# Patient Record
Sex: Male | Born: 1980
Health system: Southern US, Community
[De-identification: ages and names within clinical notes are randomized; demographics above are authoritative.]

## PROBLEM LIST (undated history)

## (undated) DIAGNOSIS — I10 Essential (primary) hypertension: Secondary | ICD-10-CM

## (undated) DIAGNOSIS — N189 Chronic kidney disease, unspecified: Secondary | ICD-10-CM

## (undated) DIAGNOSIS — I519 Heart disease, unspecified: Secondary | ICD-10-CM

## (undated) DIAGNOSIS — E669 Obesity, unspecified: Secondary | ICD-10-CM

## (undated) DIAGNOSIS — E119 Type 2 diabetes mellitus without complications: Secondary | ICD-10-CM

## (undated) DIAGNOSIS — N186 End stage renal disease: Secondary | ICD-10-CM

## (undated) HISTORY — PX: TONSILLECTOMY AND ADENOIDECTOMY: SUR1326

---
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
## 2008-08-19 ENCOUNTER — Emergency Department (HOSPITAL_COMMUNITY): Admission: EM | Admit: 2008-08-19 | Discharge: 2008-08-19 | Payer: Self-pay | Admitting: Emergency Medicine

## 2011-12-24 ENCOUNTER — Encounter (HOSPITAL_COMMUNITY): Payer: Self-pay | Admitting: *Deleted

## 2011-12-24 ENCOUNTER — Inpatient Hospital Stay (HOSPITAL_COMMUNITY)
Admission: EM | Admit: 2011-12-24 | Discharge: 2011-12-27 | DRG: 683 | Disposition: A | Discharge status: Home / Self Care | Payer: Medicaid Other | Attending: Internal Medicine | Admitting: Internal Medicine

## 2011-12-24 DIAGNOSIS — R739 Hyperglycemia, unspecified: Secondary | ICD-10-CM

## 2011-12-24 DIAGNOSIS — E119 Type 2 diabetes mellitus without complications: Secondary | ICD-10-CM | POA: Diagnosis present

## 2011-12-24 DIAGNOSIS — N186 End stage renal disease: Secondary | ICD-10-CM | POA: Diagnosis present

## 2011-12-24 DIAGNOSIS — Z6837 Body mass index (BMI) 37.0-37.9, adult: Secondary | ICD-10-CM

## 2011-12-24 DIAGNOSIS — N289 Disorder of kidney and ureter, unspecified: Secondary | ICD-10-CM

## 2011-12-24 DIAGNOSIS — I160 Hypertensive urgency: Secondary | ICD-10-CM | POA: Diagnosis present

## 2011-12-24 DIAGNOSIS — N179 Acute kidney failure, unspecified: Principal | ICD-10-CM | POA: Diagnosis present

## 2011-12-24 DIAGNOSIS — I10 Essential (primary) hypertension: Secondary | ICD-10-CM | POA: Diagnosis present

## 2011-12-24 DIAGNOSIS — H538 Other visual disturbances: Secondary | ICD-10-CM | POA: Diagnosis present

## 2011-12-24 DIAGNOSIS — E871 Hypo-osmolality and hyponatremia: Secondary | ICD-10-CM | POA: Diagnosis present

## 2011-12-24 HISTORY — DX: Essential (primary) hypertension: I10

## 2011-12-24 LAB — CARDIAC PANEL(CRET KIN+CKTOT+MB+TROPI): Total CK: 1254 U/L — ABNORMAL HIGH (ref 7–232)

## 2011-12-24 LAB — COMPREHENSIVE METABOLIC PANEL
Albumin: 3 g/dL — ABNORMAL LOW (ref 3.5–5.2)
Alkaline Phosphatase: 98 U/L (ref 39–117)
BUN: 17 mg/dL (ref 6–23)
Chloride: 98 mEq/L (ref 96–112)
GFR calc Af Amer: 47 mL/min — ABNORMAL LOW (ref >90)
Glucose, Bld: 402 mg/dL — ABNORMAL HIGH (ref 70–99)
Potassium: 4.9 mEq/L (ref 3.5–5.1)
Total Bilirubin: 0.3 mg/dL (ref 0.3–1.2)

## 2011-12-24 LAB — CBC
HCT: 39.8 % (ref 39.0–52.0)
Hemoglobin: 13.5 g/dL (ref 13.0–17.0)
MCH: 27.6 pg (ref 26.0–34.0)
MCHC: 33.9 g/dL (ref 30.0–36.0)

## 2011-12-24 LAB — URINALYSIS, ROUTINE W REFLEX MICROSCOPIC
Glucose, UA: >1000 mg/dL — AB
Leukocytes, UA: NEGATIVE
Nitrite: NEGATIVE
Protein, ur: 100 mg/dL — AB

## 2011-12-24 LAB — RAPID URINE DRUG SCREEN, HOSP PERFORMED
Opiates: NOT DETECTED
Tetrahydrocannabinol: NOT DETECTED

## 2011-12-24 LAB — GLUCOSE, CAPILLARY: Glucose-Capillary: 224 mg/dL — ABNORMAL HIGH (ref 70–99)

## 2011-12-24 LAB — MRSA PCR SCREENING: MRSA by PCR: NEGATIVE

## 2011-12-24 LAB — URINE MICROSCOPIC-ADD ON

## 2011-12-24 LAB — DIFFERENTIAL
Basophils Relative: 0 % (ref 0–1)
Eosinophils Absolute: 0.1 10*3/uL (ref 0.0–0.7)
Monocytes Absolute: 0.7 10*3/uL (ref 0.1–1.0)
Monocytes Relative: 8 % (ref 3–12)

## 2011-12-24 LAB — HEMOGLOBIN A1C: Hgb A1c MFr Bld: 15.8 % — ABNORMAL HIGH (ref <5.7)

## 2011-12-24 MED ORDER — INSULIN ASPART 100 UNIT/ML ~~LOC~~ SOLN
0.0000 [IU] | Freq: Three times a day (TID) | SUBCUTANEOUS | Status: DC
  Administered 2011-12-25: 5 [IU] via SUBCUTANEOUS
  Administered 2011-12-25: 17:00:00 via SUBCUTANEOUS
  Administered 2011-12-25 – 2011-12-26 (×2): 5 [IU] via SUBCUTANEOUS
  Administered 2011-12-26: 3 [IU] via SUBCUTANEOUS
  Administered 2011-12-26: 2 [IU] via SUBCUTANEOUS
  Administered 2011-12-27: 5 [IU] via SUBCUTANEOUS
  Administered 2011-12-27 (×2): 3 [IU] via SUBCUTANEOUS

## 2011-12-24 MED ORDER — METOPROLOL TARTRATE 1 MG/ML IV SOLN
5.0000 mg | Freq: Once | INTRAVENOUS | Status: AC
  Administered 2011-12-24: 5 mg via INTRAVENOUS
  Filled 2011-12-24: qty 5

## 2011-12-24 MED ORDER — ONDANSETRON HCL 4 MG PO TABS: 4.0000 mg | ORAL_TABLET | Freq: Four times a day (QID) | ORAL | Status: DC | PRN

## 2011-12-24 MED ORDER — AMLODIPINE BESYLATE 5 MG PO TABS
5.0000 mg | ORAL_TABLET | Freq: Every day | ORAL | Status: DC
Start: 2011-12-25 — End: 2011-12-25
  Filled 2011-12-24: qty 1

## 2011-12-24 MED ORDER — HYDRALAZINE HCL 20 MG/ML IJ SOLN
10.0000 mg | INTRAMUSCULAR | Status: AC
  Administered 2011-12-24 – 2011-12-25 (×2): 10 mg via INTRAVENOUS
  Filled 2011-12-24: qty 0.5

## 2011-12-24 MED ORDER — ACETAMINOPHEN 650 MG RE SUPP: 650.0000 mg | Freq: Four times a day (QID) | RECTAL | Status: DC | PRN

## 2011-12-24 MED ORDER — METOPROLOL TARTRATE 1 MG/ML IV SOLN
5.0000 mg | Freq: Three times a day (TID) | INTRAVENOUS | Status: DC | PRN
  Administered 2011-12-24 – 2011-12-25 (×2): 5 mg via INTRAVENOUS
  Filled 2011-12-24: qty 5

## 2011-12-24 MED ORDER — SODIUM CHLORIDE 0.9 % IV BOLUS (SEPSIS)
1000.0000 mL | Freq: Once | INTRAVENOUS | Status: AC
  Administered 2011-12-24: 1000 mL via INTRAVENOUS

## 2011-12-24 MED ORDER — INSULIN ASPART 100 UNIT/ML ~~LOC~~ SOLN
5.0000 [IU] | Freq: Once | SUBCUTANEOUS | Status: AC
  Administered 2011-12-24: 5 [IU] via INTRAVENOUS
  Filled 2011-12-24: qty 1

## 2011-12-24 MED ORDER — SODIUM CHLORIDE 0.45 % IV SOLN
INTRAVENOUS | Status: DC
  Administered 2011-12-24: 21:00:00 via INTRAVENOUS
  Administered 2011-12-25: 100 mL/h via INTRAVENOUS
  Administered 2011-12-26: 17:00:00 via INTRAVENOUS
  Administered 2011-12-27: 1000 mL via INTRAVENOUS

## 2011-12-24 MED ORDER — ACETAMINOPHEN 325 MG PO TABS
650.0000 mg | ORAL_TABLET | Freq: Four times a day (QID) | ORAL | Status: DC | PRN
  Administered 2011-12-25 – 2011-12-27 (×3): 650 mg via ORAL
  Filled 2011-12-24 (×4): qty 2

## 2011-12-24 MED ORDER — ADULT MULTIVITAMIN W/MINERALS CH
1.0000 | ORAL_TABLET | Freq: Every day | ORAL | Status: DC
  Administered 2011-12-24 – 2011-12-27 (×4): 1 via ORAL
  Filled 2011-12-24 (×4): qty 1

## 2011-12-24 MED ORDER — METOPROLOL TARTRATE 1 MG/ML IV SOLN
INTRAVENOUS | Status: AC
  Filled 2011-12-24: qty 5

## 2011-12-24 MED ORDER — INSULIN ASPART 100 UNIT/ML ~~LOC~~ SOLN
0.0000 [IU] | Freq: Every day | SUBCUTANEOUS | Status: DC
  Administered 2011-12-24: 3 [IU] via SUBCUTANEOUS
  Administered 2011-12-25: 2 [IU] via SUBCUTANEOUS

## 2011-12-24 MED ORDER — ONDANSETRON HCL 4 MG/2ML IJ SOLN: 4.0000 mg | Freq: Four times a day (QID) | INTRAMUSCULAR | Status: DC | PRN

## 2011-12-24 NOTE — ED Notes (Signed)
Alert, NAD, calm, interactive, skin W&D, resps e/u, speaking in clear complete sentences, reports only blurry vision. (denies: nv, sob, pain, HA, numbness/ tingling or other sx). Family at [facility], BP high, orders received, pending room assignment. 2nd liter infusing.

## 2011-12-24 NOTE — ED Provider Notes (Signed)
Bilateral blurred vision intermittently for the past week, constant since Friday. Hx HTN but not treated.  No chest pain, SOB, nausea, vomiting, abdominal pain.  Nonfocal neuro exam.  EKG with inferior T wave inversions, strain pattern.  CBG 350, no hx diabetes.  Ezequiel Essex, MD 12/24/11 [phone]

## 2011-12-24 NOTE — H&P (Addendum)
PCP:  William Hamburger, MD, MD   DOA:  12/24/2011  3:55 PM  Chief Complaint:  Blurring of vision  HPI: 31 years old  morbidly obese African American man, is remote history of hypertension about 7-8 years ago when he was treated with medication at that time however patient was not on any medication since then. He presented to the ER today with chief complaint of blurring of vision started last week on and off however persists since Friday, he denies any headaches, chest pain, shortness of breath, muscle weakness or numbness. Patient also reportedurinary frequency and thirst .In the ED patient was found to have systolic blood pressure in the [id number] to [id number] diastolic [id number], labs showed blood glucose level of 400 and creatinine of 2. EKG showed inferior ST-T wave changes,normal troponin. We are consulted to admit for further management.  Allergies: No Known Allergies  Prior to Admission medications   Medication Sig Start Date End Date Taking? Authorizing Provider  ibuprofen (ADVIL,MOTRIN) 200 MG tablet Take 400 mg by mouth every 6 (six) hours as needed. For headache   Yes Historical Provider, MD  Multiple Vitamins-Minerals (MULTIVITAMINS THER. W/MINERALS) TABS Take 1 tablet by mouth daily.   Yes Historical Provider, MD    Past Medical History  Diagnosis Date  . Hypertension      Social History: Lives with his wife, reports that he has never smoke . He reports that he drinks alcohol occasionally, denies illicit drug use.  family history : Mother is hypertensive  Review of Systems: As above in history of present illness Constitutional: Denies fever, chills, diaphoresis, appetite change and fatigue.  HEENT: Denies photophobia, eye pain, redness, hearing loss, ear pain, congestion, sore throat, rhinorrhea, sneezing, mouth sores, trouble swallowing, neck pain, neck stiffness and tinnitus.   Respiratory: Denies SOB, DOE, cough, chest tightness,  and wheezing.   Cardiovascular: Denies chest  pain, palpitations and leg swelling.  Gastrointestinal: Denies nausea, vomiting, abdominal pain, diarrhea, constipation, blood in stool and abdominal distention.  Genitourinary: Denies dysuria, urgency, frequency, hematuria, flank pain and difficulty urinating.   Neurological: Denies dizziness, seizures, syncope, weakness, light-headedness, numbness and headaches.     Physical Exam:  Filed Vitals:   12/24/11 1556 12/24/11 1601 12/24/11 1734  BP: 178/120 167/114 176/114  Pulse: 100  86  Temp: 98.1 F (36.7 C)    TempSrc: Oral    Resp: 20  13  Height: 7\' 3"  (2.21 m)    Weight: 149.687 kg (330 lb)    SpO2: 100%  100%    Constitutional: Vital signs reviewed.  Patient is morbidly obese no acute distress and cooperative with exam. Alert and oriented x3.  Eyes: PERRL, EOMI, conjunctivae normal, No scleral icterus.  Neck: Supple, Trachea midline normal ROM, No JVD, mass, thyromegaly, or carotid bruit present.  Cardiovascular: RRR, S1 normal, S2 normal, no MRG, pulses symmetric and intact bilaterally Pulmonary/Chest: CTAB, no wheezes, rales, or rhonchi Abdominal: Soft. Non-tender, non-distended, bowel sounds are normal, no masses, organomegaly, or guarding present.   no nontender Ext: no edema and no cyanosis, pulses palpable bilaterally (DP and PT) Neurological: A&O x3, Strenght is normal and symmetric bilaterally, cranial nerve II-XII are grossly intact, no focal motor deficit, sensory intact to light touch bilaterally.    Labs on Admission:  Results for orders placed during the hospital encounter of 12/24/11 (from the past 48 hour(s))  CBC     Status: Normal   Collection Time   12/24/11  4:19 PM      Component  Value Range Comment   WBC 9.1  4.0 - 10.5 (K/uL)    RBC 4.90  4.22 - 5.81 (MIL/uL)    Hemoglobin 13.5  13.0 - 17.0 (g/dL)    HCT 39.8  39.0 - 52.0 (%)    MCV 81.2  78.0 - 100.0 (fL)    MCH 27.6  26.0 - 34.0 (pg)    MCHC 33.9  30.0 - 36.0 (g/dL)    RDW 13.2  11.5 - 15.5  (%)    Platelets 327  150 - 400 (K/uL)   DIFFERENTIAL     Status: Normal   Collection Time   12/24/11  4:19 PM      Component Value Range Comment   Neutrophils Relative 53  43 - 77 (%)    Neutro Abs 4.8  1.7 - 7.7 (K/uL)    Lymphocytes Relative 38  12 - 46 (%)    Lymphs Abs 3.5  0.7 - 4.0 (K/uL)    Monocytes Relative 8  3 - 12 (%)    Monocytes Absolute 0.7  0.1 - 1.0 (K/uL)    Eosinophils Relative 2  0 - 5 (%)    Eosinophils Absolute 0.1  0.0 - 0.7 (K/uL)    Basophils Relative 0  0 - 1 (%)    Basophils Absolute 0.0  0.0 - 0.1 (K/uL)   COMPREHENSIVE METABOLIC PANEL     Status: Abnormal   Collection Time   12/24/11  4:19 PM      Component Value Range Comment   Sodium 132 (*) 135 - 145 (mEq/L)    Potassium 4.9  3.5 - 5.1 (mEq/L)    Chloride 98  96 - 112 (mEq/L)    CO2 22  19 - 32 (mEq/L)    Glucose, Bld 402 (*) 70 - 99 (mg/dL)    BUN 17  6 - 23 (mg/dL)    Creatinine, Ser 2.13 (*) 0.50 - 1.35 (mg/dL)    Calcium 9.6  8.4 - 10.5 (mg/dL)    Total Protein 8.0  6.0 - 8.3 (g/dL)    Albumin 3.0 (*) 3.5 - 5.2 (g/dL)    AST 100 (*) 0 - 37 (U/L)    ALT 67 (*) 0 - 53 (U/L)    Alkaline Phosphatase 98  39 - 117 (U/L)    Total Bilirubin 0.3  0.3 - 1.2 (mg/dL)    GFR calc non Af Amer 41 (*) >90 (mL/min)    GFR calc Af Amer 47 (*) >90 (mL/min)   CK TOTAL AND CKMB     Status: Abnormal   Collection Time   12/24/11  4:20 PM      Component Value Range Comment   Total CK 1495 (*) 7 - 232 (U/L)    CK, MB 5.2 (*) 0.3 - 4.0 (ng/mL)    Relative Index 0.3  0.0 - 2.5    TROPONIN I     Status: Normal   Collection Time   12/24/11  4:20 PM      Component Value Range Comment   Troponin I <0.30  <0.30 (ng/mL)   GLUCOSE, CAPILLARY     Status: Abnormal   Collection Time   12/24/11  4:43 PM      Component Value Range Comment   Glucose-Capillary 356 (*) 70 - 99 (mg/dL)   URINE RAPID DRUG SCREEN (HOSP PERFORMED)     Status: Normal   Collection Time   12/24/11  4:56 PM      Component Value Range Comment  Opiates NONE DETECTED  NONE DETECTED     Cocaine NONE DETECTED  NONE DETECTED     Benzodiazepines NONE DETECTED  NONE DETECTED     Amphetamines NONE DETECTED  NONE DETECTED     Tetrahydrocannabinol NONE DETECTED  NONE DETECTED     Barbiturates NONE DETECTED  NONE DETECTED    URINALYSIS, ROUTINE W REFLEX MICROSCOPIC     Status: Abnormal   Collection Time   12/24/11  4:56 PM      Component Value Range Comment   Color, Urine YELLOW  YELLOW     APPearance CLEAR  CLEAR     Specific Gravity, Urine 1.018  1.005 - 1.030     pH 5.5  5.0 - 8.0     Glucose, UA >1000 (*) NEGATIVE (mg/dL)    Hgb urine dipstick NEGATIVE  NEGATIVE     Bilirubin Urine NEGATIVE  NEGATIVE     Ketones, ur NEGATIVE  NEGATIVE (mg/dL)    Protein, ur 100 (*) NEGATIVE (mg/dL)    Urobilinogen, UA 0.2  0.0 - 1.0 (mg/dL)    Nitrite NEGATIVE  NEGATIVE     Leukocytes, UA NEGATIVE  NEGATIVE    URINE MICROSCOPIC-ADD ON     Status: Normal   Collection Time   12/24/11  4:56 PM      Component Value Range Comment   Squamous Epithelial / LPF RARE  RARE     WBC, UA 0-2  <3 (WBC/hpf)    Urine-Other MUCOUS PRESENT     GLUCOSE, CAPILLARY     Status: Abnormal   Collection Time   12/24/11  6:11 PM      Component Value Range Comment   Glucose-Capillary 302 (*) 70 - 99 (mg/dL)     Radiological Exams on Admission: No results found.  Assessment/Plan Active Problems:  Hypertensive urgency, malignant  Diabetes mellitus: New onset  Renal insufficiency Abnormal EKG Morbid obesity Pseudohyponatremia (secondary to hyperglycemia) Plan: -Admit to step down unit. -We'll treat with IV metoprolol every 8 hours when necessary for systolic blood pressure [id number], avoid rapid reduction . Will order for amlodipine 5 mg daily to start tomorrow. Would benefit from ACE inhibitor if his renal function improves. -Check hemoglobin A1c, place on moderate insulin scale. Consider starting  Lantus insulin tomorrow. Avoid metformin given renal failure -Renal  insufficiency is probably chronic however no available Baseline compare, etiology probably secondary to hypertensive nephrosclerosis+/- diabetic nephropathy. Patient was also on NSAIDs as an outpatient which probably contributed as well. FSGS secondary to obesity is also a possibility. Check spot urine protein/creatinine ratio, urine sodium, urine creatinine, urine eosinophil and renal sonogram. Gently hydrate. -Cycle cardiac enzymes, EKG changes probably related to hypertensive heart disease, check 2-D echocardiogram -Nutritionist consult, diabetic coordinator consult -Case manager consult to help establishing PCP on discharge .   Time Spent on Admission: Approximately 45 minutes  Jaidev Sanger 12/24/2011, 6:28 PM

## 2011-12-24 NOTE — ED Notes (Signed)
The pt has had blurred vision since Friday.  He has had ion the past and his bp has been elevated in the past.  He does not take any meds for his bp

## 2011-12-24 NOTE — ED Provider Notes (Signed)
History     CSN: [id number]  Arrival date & time 12/24/11  1541   First MD Initiated Contact with Patient 12/24/11 1641      Chief Complaint  Patient presents with  . blurred vision     (Consider location/radiation/quality/duration/timing/severity/associated sxs/prior treatment) Patient is a 31 y.o. male presenting with eye problem. The history is provided by the patient.  Eye Problem  This is a new problem. The current episode started more than 1 week ago. The problem occurs constantly. The problem has not changed since onset.There is pain in both eyes. There was no injury mechanism. The pain is at a severity of 0/10. The patient is experiencing no pain. There is no history of trauma to the eye. There is no known exposure to pink eye. He does not wear contacts. Associated symptoms include decreased vision. Pertinent negatives include no numbness, no double vision, no foreign body sensation, no photophobia, no eye redness, no nausea, no vomiting and no weakness. He has tried nothing for the symptoms.    Past Medical History  Diagnosis Date  . Hypertension     History reviewed. No pertinent past surgical history.  No family history on file.  History  Substance Use Topics  . Smoking status: Never Smoker   . Smokeless tobacco: Not on file  . Alcohol Use: Yes      Review of Systems  Constitutional: Positive for appetite change. Negative for fever and fatigue.  HENT: Negative for neck pain.   Eyes: Positive for visual disturbance. Negative for double vision, photophobia, pain, redness and itching.  Respiratory: Negative for chest tightness and shortness of breath.   Cardiovascular: Negative for chest pain.  Gastrointestinal: Negative for nausea, vomiting, abdominal pain and diarrhea.  Genitourinary: Negative for decreased urine volume.  Skin: Negative for rash.  Neurological: Negative for weakness, numbness and headaches.  All other systems reviewed and are  negative.    Allergies  Review of patient's allergies indicates no known allergies.  Home Medications   Current Outpatient Rx  Name Route Sig Dispense Refill  . IBUPROFEN 200 MG PO TABS Oral Take 400 mg by mouth every 6 (six) hours as needed. For headache    . THERA M PLUS PO TABS Oral Take 1 tablet by mouth daily.      BP 167/114  Pulse 100  Temp(Src) 98.1 F (36.7 C) (Oral)  Resp 20  Ht 7\' 3"  (2.21 m)  Wt 330 lb (149.687 kg)  BMI 30.65 kg/m2  SpO2 100%  Physical Exam  Constitutional: He is oriented to person, place, and time. He appears well-developed and well-nourished.  HENT:  Head: Normocephalic and atraumatic.  Eyes: Conjunctivae and EOM are normal. Pupils are equal, round, and reactive to light.  Fundoscopic exam:      The right eye shows AV nicking.       The left eye shows AV nicking.  Neck: Normal range of motion.  Cardiovascular: Normal rate, regular rhythm and normal heart sounds.   Pulmonary/Chest: Effort normal and breath sounds normal. No respiratory distress.  Abdominal: Soft. There is no tenderness.  Musculoskeletal: Normal range of motion.  Neurological: He is alert and oriented to person, place, and time.  Skin: Skin is warm and dry.  Psychiatric: He has a normal mood and affect.    ED Course  Procedures (including critical care time)  Date: 12/24/2011  Rate: 83  Rhythm: normal sinus rhythm  QRS Axis: normal  Intervals: normal  ST/T Wave abnormalities: nonspecific ST/T  changes Concerning for strain with TWI III, aVf.   Conduction Disutrbances:none  Narrative Interpretation:   Old EKG Reviewed: changes noted   Labs Reviewed  CK TOTAL AND CKMB - Abnormal; Notable for the following:    Total CK 1495 (*)    CK, MB 5.2 (*)    All other components within normal limits  COMPREHENSIVE METABOLIC PANEL - Abnormal; Notable for the following:    Sodium 132 (*)    Glucose, Bld 402 (*)    Creatinine, Ser 2.13 (*)    Albumin 3.0 (*)    AST 100 (*)     ALT 67 (*)    GFR calc non Af Amer 41 (*)    GFR calc Af Amer 47 (*)    All other components within normal limits  GLUCOSE, CAPILLARY - Abnormal; Notable for the following:    Glucose-Capillary 356 (*)    All other components within normal limits  URINALYSIS, ROUTINE W REFLEX MICROSCOPIC - Abnormal; Notable for the following:    Glucose, UA >1000 (*)    Protein, ur 100 (*)    All other components within normal limits  GLUCOSE, CAPILLARY - Abnormal; Notable for the following:    Glucose-Capillary 302 (*)    All other components within normal limits  GLUCOSE, CAPILLARY - Abnormal; Notable for the following:    Glucose-Capillary 209 (*)    All other components within normal limits  GLUCOSE, CAPILLARY - Abnormal; Notable for the following:    Glucose-Capillary 224 (*)    All other components within normal limits  CBC  DIFFERENTIAL  TROPONIN I  URINE RAPID DRUG SCREEN (HOSP PERFORMED)  URINE MICROSCOPIC-ADD ON  HEMOGLOBIN A1C   No results found.   No diagnosis found. Diagnoses: Diabetes Mellitus, Renal insffuciency, Hypertension.    MDM  Patient presents with several week history of blurry vision. He describes increased difficulty reading small fine print as well as a text message. He denies any flashes or floaters. He denies any eye pain or trauma.  Triage blood pressure 170/120. CBG with a value of 356. Patient has no history of hypertension or diabetes. Further history does endorse unplanned weight loss as well as polyuria and polydipsia. He denies changes in his diet. Unsure family history but states his mother may have high blood pressure.  His EKG has a strain pattern with slight ST elevation in only approximately 1 mm in lead V2. There are T wave inversions in leads 3 and aVF. This is changed from his prior EKG of 2002 which had some early repolarization but no T wave inversion changes. Patient is not endorse a history that could be related to ischemia. He states has been  ongoing normal activities and works out in [employer] several times weekly. He denies any chest pain,  fatigue or shortness of breath limiting his activities.   Further workup here with indication of renal insufficiency. Feel like this is likely related to his poorly controlled hypertension as well as his diabetes which was previously been undiagnosed. Consult from hospitalist for further management as an inpatient. Patient does not have a PCP and close followup will be more difficult. She requested better control of his blood pressure the patient without symptoms of hypertensive emergency. He denies any headache or focal weakness. He like his blurred vision is more likely related to his osmotic shifts due to his hyperglycemia. Blood pressure control provide in the ED and patient will be admitted to the step down unit.  No focal  neurologic deficits or headache do not feel as though head CT indicated.     Helyn App, MD 12/24/11 2108

## 2011-12-25 ENCOUNTER — Observation Stay (HOSPITAL_COMMUNITY): Payer: Medicaid Other

## 2011-12-25 DIAGNOSIS — R9431 Abnormal electrocardiogram [ECG] [EKG]: Secondary | ICD-10-CM

## 2011-12-25 LAB — COMPREHENSIVE METABOLIC PANEL
ALT: 56 U/L — ABNORMAL HIGH (ref 0–53)
Alkaline Phosphatase: 84 U/L (ref 39–117)
BUN: 15 mg/dL (ref 6–23)
CO2: 23 mEq/L (ref 19–32)
GFR calc Af Amer: 52 mL/min — ABNORMAL LOW (ref >90)
GFR calc non Af Amer: 45 mL/min — ABNORMAL LOW (ref >90)
Glucose, Bld: 279 mg/dL — ABNORMAL HIGH (ref 70–99)
Potassium: 4.4 mEq/L (ref 3.5–5.1)
Sodium: 136 mEq/L (ref 135–145)
Total Bilirubin: 0.3 mg/dL (ref 0.3–1.2)
Total Protein: 7.6 g/dL (ref 6.0–8.3)

## 2011-12-25 LAB — URINALYSIS, ROUTINE W REFLEX MICROSCOPIC
Bilirubin Urine: NEGATIVE
Ketones, ur: NEGATIVE mg/dL
Leukocytes, UA: NEGATIVE
Nitrite: NEGATIVE
Specific Gravity, Urine: 1.01 (ref 1.005–1.030)
Urobilinogen, UA: 0.2 mg/dL (ref 0.0–1.0)
pH: 6 (ref 5.0–8.0)

## 2011-12-25 LAB — CARDIAC PANEL(CRET KIN+CKTOT+MB+TROPI)
Relative Index: 0.3 (ref 0.0–2.5)
Total CK: 995 U/L — ABNORMAL HIGH (ref 7–232)
Troponin I: <0.3 ng/mL (ref <0.30)

## 2011-12-25 LAB — GLUCOSE, CAPILLARY: Glucose-Capillary: 234 mg/dL — ABNORMAL HIGH (ref 70–99)

## 2011-12-25 LAB — HEMOGLOBIN A1C: Mean Plasma Glucose: 430 mg/dL — ABNORMAL HIGH (ref <117)

## 2011-12-25 LAB — PROTEIN / CREATININE RATIO, URINE: Creatinine, Urine: 49.37 mg/dL

## 2011-12-25 MED ORDER — METOPROLOL TARTRATE 25 MG PO TABS
25.0000 mg | ORAL_TABLET | Freq: Two times a day (BID) | ORAL | Status: DC
  Administered 2011-12-25 – 2011-12-27 (×5): 25 mg via ORAL
  Filled 2011-12-25 (×7): qty 1

## 2011-12-25 MED ORDER — INSULIN GLARGINE 100 UNIT/ML ~~LOC~~ SOLN
20.0000 [IU] | Freq: Every day | SUBCUTANEOUS | Status: DC
  Administered 2011-12-25 – 2011-12-26 (×2): 20 [IU] via SUBCUTANEOUS

## 2011-12-25 MED ORDER — HYDRALAZINE HCL 20 MG/ML IJ SOLN
INTRAMUSCULAR | Status: AC
  Administered 2011-12-25: 10 mg via INTRAVENOUS
  Filled 2011-12-25: qty 1

## 2011-12-25 MED ORDER — LIVING WELL WITH DIABETES BOOK
Freq: Once | Status: AC
  Administered 2011-12-25: 1
  Filled 2011-12-25: qty 1

## 2011-12-25 MED ORDER — LIVING WELL WITH DIABETES BOOK
Freq: Once | Status: AC
  Filled 2011-12-25: qty 1

## 2011-12-25 MED ORDER — AMLODIPINE BESYLATE 10 MG PO TABS
10.0000 mg | ORAL_TABLET | Freq: Every day | ORAL | Status: DC
  Administered 2011-12-25 – 2011-12-27 (×3): 10 mg via ORAL
  Filled 2011-12-25 (×3): qty 1

## 2011-12-25 MED ORDER — HYDRALAZINE HCL 20 MG/ML IJ SOLN
10.0000 mg | Freq: Four times a day (QID) | INTRAMUSCULAR | Status: DC | PRN
  Administered 2011-12-25 – 2011-12-27 (×2): 10 mg via INTRAVENOUS
  Filled 2011-12-25 (×3): qty 0.5

## 2011-12-25 NOTE — Progress Notes (Signed)
12/25/2011 1110 Called MD regarding patients BP= 171/109. Received order for hydralazine PRN for sbp >160. Gave 10mg  IV hydralazine and bp is now 168/107. Dr. Blenda Nicely is ok with patient transferring to 41.  Irish Elders

## 2011-12-25 NOTE — Progress Notes (Signed)
12/25/2011 [facility], [place] Case Management Note [id number]    CARE MANAGEMENT NOTE 12/25/2011  Patient:  Patrick Ibarra, Patrick Ibarra   Account Number:  [id number]  Date Initiated:  12/25/2011  Documentation initiated by:  Kellie Moor  Subjective/Objective Assessment:   admitted on 12/24/11 with blurred vision.    Diagnosed with hypertensive urgency     Action/Plan:   step-down  IV lopressor  cardiac enzymes  prior to admission, patient was independent with ADLs and lived at home with spouse   Anticipated DC Date:  12/27/2011   Anticipated DC Plan:  [place]  CM consult      Choice offered to / List presented to:             Status of service:  In process, will continue to follow Medicare Important Message given?   (If response is "NO", the following Medicare IM given date fields will be blank) Date Medicare IM given:   Date Additional Medicare IM given:    Discharge Disposition:    Per UR Regulation:  Reviewed for med. necessity/level of care/duration of stay  If discussed at [place] of Stay Meetings, dates discussed:    Comments:  PCP: none  Contact: Brunelli,VIRGINIA (spouse) [phone]

## 2011-12-25 NOTE — Progress Notes (Signed)
*  PRELIMINARY RESULTS* Echocardiogram 2D Echocardiogram has been performed.  [name] [facility] 12/25/2011, 10:48 AM

## 2011-12-25 NOTE — Progress Notes (Signed)
Pt admitted to the unit as transfer from 3300. Report received from [place], [place] previously. Pt is alert and oriented. Pt oriented to room, staff, and call bell. Bed in lowest position. Full assessment to Epic. Call bell with in reach. Told to call for assists. Elevated b/p on transfer. MD previously made aware as prn dose has been previously admin. Will continue to monitor. Mady Gemma, RN

## 2011-12-25 NOTE — Progress Notes (Signed)
Utilization review completed. Patrick Ibarra 12/25/2011

## 2011-12-25 NOTE — Progress Notes (Signed)
Subjective: Asymptomatic this morning.  Objective: Vital signs in last 24 hours: Temp:  [98 F (36.7 C)-98.4 F (36.9 C)] 98.3 F (36.8 C) (04/24 0500) Pulse Rate:  [73-100] 76  (04/24 0500) Resp:  [12-20] 16  (04/24 0500) BP: (153-186)/(97-121) 154/105 mmHg (04/24 0500) SpO2:  [97 %-100 %] 97 % (04/24 0500) Weight:  [130.7 kg (288 lb 2.3 oz)-149.687 kg (330 lb)] 130.7 kg (288 lb 2.3 oz) (04/23 2030) Weight change:  Last BM Date: 12/23/11  Intake/Output from previous day: 04/23 0701 - 04/24 0700 In: 1600 [I.V.:1600] Out: -      Physical Exam: General: Comfortable, alert, communicative, fully oriented, not short of breath at rest.  HEENT:  No clinical pallor, no jaundice, no conjunctival injection or discharge. NECK:  Supple, JVP not seen, no carotid bruits, no palpable lymphadenopathy, no palpable goiter. CHEST:  Clinically clear to auscultation, no wheezes, no crackles. HEART:  Sounds 1 and 2 heard, normal, regular, no murmurs. ABDOMEN:  Moderately obese, soft, non-tender, no palpable organomegaly, no palpable masses, normal bowel sounds. GENITALIA:  Not examined. LOWER EXTREMITIES:  No pitting edema, palpable peripheral pulses. MUSCULOSKELETAL SYSTEM:  Unremarkable. CENTRAL NERVOUS SYSTEM:  No focal neurologic deficit on gross examination.  Lab Results:  [facility] 12/24/11 1619  WBC 9.1  HGB 13.5  HCT 39.8  PLT 327    Basename 12/25/11 0330 12/24/11 1619  NA 136 132*  K 4.4 4.9  CL 103 98  CO2 23 22  GLUCOSE 279* 402*  BUN 15 17  CREATININE 1.96* 2.13*  CALCIUM 9.3 9.6   Recent Results (from the past 240 hour(s))  MRSA PCR SCREENING     Status: Normal   Collection Time   12/24/11  9:27 PM      Component Value Range Status Comment   MRSA by PCR NEGATIVE  NEGATIVE  Final      Studies/Results: No results found.  Medications: Scheduled Meds:   . amLODipine  10 mg Oral Daily  . hydrALAZINE  10 mg Intravenous To Major  . insulin aspart  0-15 Units  Subcutaneous TID WC  . insulin aspart  0-5 Units Subcutaneous QHS  . insulin aspart  5 Units Intravenous Once  . insulin glargine  20 Units Subcutaneous Daily  . metoprolol      . metoprolol  5 mg Intravenous Once  . metoprolol tartrate  25 mg Oral BID  . mulitivitamin with minerals  1 tablet Oral Daily  . sodium chloride  1,000 mL Intravenous Once  . DISCONTD: amLODipine  5 mg Oral Daily   Continuous Infusions:   . sodium chloride 75 mL/hr at 12/24/11 2112   PRN Meds:.acetaminophen, acetaminophen, ondansetron (ZOFRAN) IV, ondansetron, DISCONTD: metoprolol  Assessment/Plan:  Active Problems:  1. Hypertensive urgency, malignant: BP is improved to day, and patient is asymptomatic. We shall increase Norvasc to 10 mg daily, and switch to oral Lopressor, in an initial dose of 25 mg b.i.d. Continue pn Hydralazine. Further titration may be needed.  2. Diabetes mellitus:Given body habitus, this is likely to be type 2, despite patient's youth. HBA1C is 15.8-16.6. CBGs are still in the high 200s, so we shall commence scheduled Lantus, and continue diet/SSI. Patient will need diabetic teaching, and instruction in self-administration of insulin.  3. Renal insufficiency: Patient has ARF, and mybe even underlying CKD, given HTN/DM and Nsaid use. Unfortunately, baseline creatinine is unknown. Creatinine has already improved overnight, with ivi fluids, which we have continued. Renal U/S is pending.  4. Abnormal EKG: This  appears consistent with uncontrolled HTN. Cardiac enzymes are un-elevated, patient has no chest pain or SOB. 2D Echocardiogram is pending.  Comment: Stable for transfer to telemetry floor.   LOS: 1 day   Farah Lepak,CHRISTOPHER 12/25/2011, 8:21 AM

## 2011-12-25 NOTE — Progress Notes (Signed)
Pt refuses camera monitoring at this time. Patrick Ibarra

## 2011-12-25 NOTE — Progress Notes (Signed)
Pt educated on the difference between long acting and short acting insulin. Pt and spouse educated on proper SQ administration. Encouraged pt and spouse to attempt next admin with nurse. Dorthey Sawyer

## 2011-12-25 NOTE — ED Provider Notes (Signed)
I saw and evaluated the patient, reviewed the resident's note and I agree with the findings and plan.  Intermittent blurred vision becoming more frequent. HTN, hyperglycemia. LV strain on EKG with T wave inversions inferiorly.  No CP, SOB, headache. No neuro deficits. Poorly controlled hypertension, newly diagnosed diabetes.   Ezequiel Essex, MD 12/25/11 [phone]

## 2011-12-25 NOTE — Progress Notes (Signed)
12/25/2011 10:51 AM Jannet Mantis  [id number]  Report called to [place], RN on 6700. VSS but BP is still in high range [phone] but MD is aware.. Transferred with belongings to new room, [place]. Family at bedside.  Irish Elders 4/24/201310:51 AM

## 2011-12-25 NOTE — Plan of Care (Signed)
Problem: Food- and Nutrition-Related Knowledge Deficit (NB-1.1) Goal: Nutrition education Formal process to instruct or train a patient/client in a skill or to impart knowledge to help patients/clients voluntarily manage or modify food choices and eating behavior to maintain or improve health.  Outcome: Completed/Met Date Met:  12/25/11 Chart reviewed.  Patient is obese (class 2) with BMI=37.3.  Admitted with new-onset diabetes.  Discussed CHO-counting for diabetes and provided handouts.  Reviewed portion sizes, sources of CHO, importance of eating 3 meals a day, and label reading.  Recommend patient consume 60-75 grams of CHO with each meal.  Patient and his wife were very receptive.  Answered their questions.  Recommend OP diabetes diet education at [facility], MD please order.    Dalene Carrow [phone]

## 2011-12-25 NOTE — Progress Notes (Signed)
Inpatient Diabetes Program Recommendations  AACE/ADA: New Consensus Statement on Inpatient Glycemic Control (2009)  Target Ranges:  Prepandial:   less than 140 mg/dL      Peak postprandial:   less than 180 mg/dL (1-2 hours)      Critically ill patients:  140 - 180 mg/dL   Reason for Visit: New-onset Diabetes.  A1C=16.6  Diabetes Coordinator spoke with patient concerning new diagnosis DM.  The patient is accepting and trying to understand.  Explained basic DM pathophysiology.  Discussed lifestyle modification through diet and exercise.  Patient will need a  RX for meter,strips and lancets.  Encouraged patient to view the DM videos on the patient education network.  Will continue to follow during this admission.  Thank you  Raoul Pitch [facility] Inpatient Diabetes Coordinator [phone]

## 2011-12-26 LAB — COMPREHENSIVE METABOLIC PANEL
Albumin: 2.8 g/dL — ABNORMAL LOW (ref 3.5–5.2)
Alkaline Phosphatase: 81 U/L (ref 39–117)
BUN: 11 mg/dL (ref 6–23)
CO2: 24 mEq/L (ref 19–32)
Chloride: 100 mEq/L (ref 96–112)
Creatinine, Ser: 1.79 mg/dL — ABNORMAL HIGH (ref 0.50–1.35)
GFR calc Af Amer: 58 mL/min — ABNORMAL LOW (ref >90)
GFR calc non Af Amer: 50 mL/min — ABNORMAL LOW (ref >90)
Glucose, Bld: 194 mg/dL — ABNORMAL HIGH (ref 70–99)
Potassium: 4.2 mEq/L (ref 3.5–5.1)
Total Bilirubin: 0.4 mg/dL (ref 0.3–1.2)

## 2011-12-26 LAB — GLUCOSE, CAPILLARY: Glucose-Capillary: 212 mg/dL — ABNORMAL HIGH (ref 70–99)

## 2011-12-26 MED ORDER — INSULIN GLARGINE 100 UNIT/ML ~~LOC~~ SOLN: 20.0000 [IU] | Freq: Two times a day (BID) | SUBCUTANEOUS | Status: DC

## 2011-12-26 MED ORDER — CLONIDINE HCL 0.2 MG PO TABS
0.2000 mg | ORAL_TABLET | Freq: Three times a day (TID) | ORAL | Status: DC
  Administered 2011-12-26 – 2011-12-27 (×4): 0.2 mg via ORAL
  Filled 2011-12-26 (×5): qty 1

## 2011-12-26 MED ORDER — INSULIN GLARGINE 100 UNIT/ML ~~LOC~~ SOLN
10.0000 [IU] | Freq: Two times a day (BID) | SUBCUTANEOUS | Status: DC
  Administered 2011-12-26 – 2011-12-27 (×2): 10 [IU] via SUBCUTANEOUS

## 2011-12-26 MED ORDER — BD GETTING STARTED TAKE HOME KIT: 1/2ML X 30G SYRINGES
1.0000 | Freq: Once | Status: AC
  Administered 2011-12-26: 1
  Filled 2011-12-26: qty 1

## 2011-12-26 MED ORDER — INSULIN ASPART 100 UNIT/ML ~~LOC~~ SOLN
6.0000 [IU] | Freq: Three times a day (TID) | SUBCUTANEOUS | Status: DC
  Administered 2011-12-26 – 2011-12-27 (×4): 6 [IU] via SUBCUTANEOUS

## 2011-12-26 NOTE — Progress Notes (Signed)
12/26/2011 [place], [place] Case Management Note [phone]  Utilization review completed.

## 2011-12-26 NOTE — Progress Notes (Signed)
Subjective: No new issues.   Objective: Vital signs in last 24 hours: Temp:  [97.7 F (36.5 C)-98.6 F (37 C)] 97.7 F (36.5 C) (04/25 0941) Pulse Rate:  [80-92] 85  (04/25 0941) Resp:  [18-20] 18  (04/25 0941) BP: (156-179)/(98-111) 168/103 mmHg (04/25 0941) SpO2:  [91 %-99 %] 99 % (04/25 0941) Weight:  [128.1 kg (282 lb 6.6 oz)] 128.1 kg (282 lb 6.6 oz) (04/24 2126) Weight change: -21.687 kg (-47 lb 13 oz) Last BM Date: 12/24/11  Intake/Output from previous day: 04/24 0701 - 04/25 0700 In: 1190 [P.O.:840; I.V.:350] Out: 800 [Urine:800] Total I/O In: 240 [P.O.:240] Out: -    Physical Exam: General: Comfortable, eating with gusto, alert, communicative, fully oriented, not short of breath at rest.  HEENT:  No clinical pallor, no jaundice, no conjunctival injection or discharge. Hydration status has improved. NECK:  Supple, JVP not seen, no carotid bruits, no palpable lymphadenopathy, no palpable goiter. CHEST:  Clinically clear to auscultation, no wheezes, no crackles. HEART:  Sounds 1 and 2 heard, normal, regular, no murmurs. ABDOMEN:  Moderately obese, soft, non-tender, no palpable organomegaly, no palpable masses, normal bowel sounds. GENITALIA:  Not examined. LOWER EXTREMITIES:  No pitting edema, palpable peripheral pulses. MUSCULOSKELETAL SYSTEM:  Unremarkable. CENTRAL NERVOUS SYSTEM:  No focal neurologic deficit on gross examination.  Lab Results:  [facility] 12/24/11 1619  WBC 9.1  HGB 13.5  HCT 39.8  PLT 327    Basename 12/26/11 0455 12/25/11 0330  NA 136 136  K 4.2 4.4  CL 100 103  CO2 24 23  GLUCOSE 194* 279*  BUN 11 15  CREATININE 1.79* 1.96*  CALCIUM 9.9 9.3   Recent Results (from the past 240 hour(s))  MRSA PCR SCREENING     Status: Normal   Collection Time   12/24/11  9:27 PM      Component Value Range Status Comment   MRSA by PCR NEGATIVE  NEGATIVE  Final      Studies/Results: US Renal  12/25/2011  *RADIOLOGY REPORT*  Clinical Data:  Renal  failure  RENAL/URINARY TRACT ULTRASOUND COMPLETE  Comparison:  None.  Findings:  Right Kidney:  Measures 10.6 cm.  Diffusely echogenic.   No evidence of mass or hydronephrosis.  Left Kidney:  Measures 10.5 cm.  Diffusely echogenic.   No evidence of mass or hydronephrosis.  Bladder:  Appears normal for degree of bladder distention.  IMPRESSION:  1.  Bilateral echogenic kidneys compatible with chronic medical renal disease.  Original Report Authenticated By: Angelita Ingles, M.D.    Medications: Scheduled Meds:    . amLODipine  10 mg Oral Daily  . insulin aspart  0-15 Units Subcutaneous TID WC  . insulin aspart  0-5 Units Subcutaneous QHS  . insulin aspart  6 Units Subcutaneous TID WC  . insulin glargine  20 Units Subcutaneous BID  . living well with diabetes book   Does not apply Once  . living well with diabetes book   Does not apply Once  . metoprolol tartrate  25 mg Oral BID  . mulitivitamin with minerals  1 tablet Oral Daily  . DISCONTD: insulin glargine  20 Units Subcutaneous Daily   Continuous Infusions:    . sodium chloride 100 mL/hr (12/25/11 0923)   PRN Meds:.acetaminophen, acetaminophen, hydrALAZINE, ondansetron (ZOFRAN) IV, ondansetron  Assessment/Plan:  Active Problems:  1. Hypertensive urgency, malignant: BP is improved, but sub-optima, and patient is asymptomatic. On Norvasc to 10 mg daily/Lopressor 25 mg b.i.d. We shall commence Clonidine, and  continue prn Hydralazine. Further titration may be needed.  2. Diabetes mellitus: Given body habitus, this is likely to be type 2, despite patient's youth. HBA1C is 15.8-16.6. CBGs are now in the low 200s,. We shall schedule Lantus twice daily, continue diet/SS and add meal cover. Patient is getting diabetic teaching, and instruction in self-administration of insulin.  3. Renal insufficiency: Patient has ARF, and mybe even underlying CKD, given HTN/DM and NSAID use. Unfortunately, baseline creatinine is unknown. Creatinine has  continued to improve, with ivi fluids, which we have continued. Renal of 12/25/22, shows bilateral echogenic kidneys compatible with chronic medical renal disease.  4. Abnormal EKG: This appears consistent with uncontrolled HTN. Cardiac enzymes are un-elevated, patient has no chest pain or SOB. 2D Echocardiogram of 12/25/11, shows normal left ventricular cavity size and ejection fraction of 55% to 60%. There were no regional wall motion abnormalities.  Comment: Nearing discharge.   LOS: 2 days   Prim Morace,CHRISTOPHER 12/26/2011, 1:03 PM

## 2011-12-26 NOTE — Progress Notes (Signed)
Pt's CBG 130 tonight. Spoke with Forrest Moron, NP on call. Will give 10 units Lantus tonight per MD order. Will continue to monitor. Dudley Major RN

## 2011-12-26 NOTE — Progress Notes (Signed)
Pt and his wife has been instruct about diabetic videos and how to order them.the started kit was given to pt. And explained about it.pt. And his wife are very interesting on the learning process.keep assessing pt. Needs closely

## 2011-12-27 LAB — GLUCOSE, CAPILLARY: Glucose-Capillary: 182 mg/dL — ABNORMAL HIGH (ref 70–99)

## 2011-12-27 LAB — BASIC METABOLIC PANEL
BUN: 12 mg/dL (ref 6–23)
CO2: 25 mEq/L (ref 19–32)
Chloride: 100 mEq/L (ref 96–112)
GFR calc Af Amer: 53 mL/min — ABNORMAL LOW (ref >90)
Potassium: 4.1 mEq/L (ref 3.5–5.1)

## 2011-12-27 MED ORDER — INSULIN ASPART 100 UNIT/ML ~~LOC~~ SOLN: SUBCUTANEOUS | Status: DC

## 2011-12-27 MED ORDER — CLONIDINE HCL 0.2 MG PO TABS: 0.2000 mg | ORAL_TABLET | Freq: Three times a day (TID) | ORAL | Status: DC

## 2011-12-27 MED ORDER — AMLODIPINE BESYLATE 10 MG PO TABS: 10.0000 mg | ORAL_TABLET | Freq: Every day | ORAL | Status: DC

## 2011-12-27 MED ORDER — INSULIN GLARGINE 100 UNIT/ML ~~LOC~~ SOLN: 15.0000 [IU] | Freq: Two times a day (BID) | SUBCUTANEOUS | Status: DC

## 2011-12-27 MED ORDER — METOPROLOL TARTRATE 25 MG PO TABS: 25.0000 mg | ORAL_TABLET | Freq: Two times a day (BID) | ORAL | Status: DC

## 2011-12-27 NOTE — Progress Notes (Signed)
Pt.'s wife has been giving the insulin injection to pt. Under nurse supervision She feels comfortable doing it.

## 2011-12-27 NOTE — Progress Notes (Signed)
   CARE MANAGEMENT NOTE 12/27/2011  Patient:  Patrick Ibarra, Patrick Ibarra   Account Number:  [id number]  Date Initiated:  12/25/2011  Documentation initiated by:  Kellie Moor  Subjective/Objective Assessment:   admitted on 12/24/11 with blurred vision.    Diagnosed with hypertensive urgency     Action/Plan:   step-down  IV lopressor  cardiac enzymes  prior to admission, patient was independent with ADLs and lived at home with spouse   Anticipated DC Date:  12/27/2011   Anticipated DC Plan:  [place]  CM consult      Choice offered to / List presented to:             Status of service:  In process, will continue to follow Medicare Important Message given?   (If response is "NO", the following Medicare IM given date fields will be blank) Date Medicare IM given:   Date Additional Medicare IM given:    Discharge Disposition:    Per UR Regulation:  Reviewed for med. necessity/level of care/duration of stay  If discussed at [place] of Stay Meetings, dates discussed:    Comments:  PCP: none  Contact: AZZAM, ELSAESSER (spouse) [phone]  12/26/11 Spoke with patient and his wife about need for PCP. Gave them information about [facility] [facility]. They stated that they would call for follow up appt. Gave patient pharmacy card. Fuller Plan RN, BSN, CCM

## 2011-12-27 NOTE — Discharge Summary (Addendum)
Physician Discharge Summary  Patient ID: Patrick Ibarra MRN: [id number] DOB/AGE: 04-Feb-1981 31 y.o.  Admit date: 12/24/2011 Discharge date: 12/27/2011  Primary Care Physician:  William Hamburger, MD, MD   Discharge Diagnoses:    Patient Active Problem List  Diagnoses  . Hypertensive urgency, malignant  . Diabetes mellitus  . Renal insufficiency    Medication List  As of 12/27/2011  4:14 PM   STOP taking these medications         ibuprofen 200 MG tablet         TAKE these medications         amLODipine 10 MG tablet   Commonly known as: NORVASC   Take 1 tablet (10 mg total) by mouth daily.      cloNIDine 0.2 MG tablet   Commonly known as: CATAPRES   Take 1 tablet (0.2 mg total) by mouth 3 (three) times daily.      insulin aspart 100 UNIT/ML injection   Commonly known as: novoLOG   For CBG: 70-120, No Insulin; CBG: 121-150, 2 units; CBG 151-200, 3 units; CBG: 201-250, 5 units.; CBG 251-300, 8 units; CBG: 301-350, 11 units; CBG: 351-400, 15 units.      insulin glargine 100 UNIT/ML injection   Commonly known as: LANTUS   Inject 15 Units into the skin 2 (two) times daily.      metoprolol tartrate 25 MG tablet   Commonly known as: LOPRESSOR   Take 1 tablet (25 mg total) by mouth 2 (two) times daily.      multivitamins ther. w/minerals Tabs   Take 1 tablet by mouth daily.             Disposition and Follow-up:  Follow up with primary MD.  Consults:  None None.   Significant Diagnostic Studies:  US Renal  12/25/2011  *RADIOLOGY REPORT*  Clinical Data:  Renal failure  RENAL/URINARY TRACT ULTRASOUND COMPLETE  Comparison:  None.  Findings:  Right Kidney:  Measures 10.6 cm.  Diffusely echogenic.   No evidence of mass or hydronephrosis.  Left Kidney:  Measures 10.5 cm.  Diffusely echogenic.   No evidence of mass or hydronephrosis.  Bladder:  Appears normal for degree of bladder distention.  IMPRESSION:  1.  Bilateral echogenic kidneys compatible with chronic medical  renal disease.  Original Report Authenticated By: Angelita Ingles, M.D.    Brief H and P: For complete details, refer to admission H and P. However, in brief, this is a 31 year old morbidly obese male, with known history of hypertension diagnosed about 7-8 years ago. He was treated briefly, but has not been on medication for years. He presented to the ED today with a week history of  blurring of vision, urinary frequency and thirst. He was ound to have systolic blood pressure in the [id number] to [id number] diastolic [id number], labs showed blood glucose level of 400 and creatinine of 2. EKG showed inferior ST-T wave changes, normal troponin. He was admitted for further evaluation, investigation and management.  Physical Exam: On 12/27/2011. General: Comfortable, eating with gusto, alert, communicative, fully oriented, not short of breath at rest.  HEENT: No clinical pallor, no jaundice, no conjunctival injection or discharge. Hydration status has improved.  NECK: Supple, JVP not seen, no carotid bruits, no palpable lymphadenopathy, no palpable goiter.  CHEST: Clinically clear to auscultation, no wheezes, no crackles.  HEART: Sounds 1 and 2 heard, normal, regular, no murmurs.  ABDOMEN: Moderately obese, soft, non-tender, no palpable organomegaly, no palpable masses, normal  bowel sounds.  GENITALIA: Not examined.  LOWER EXTREMITIES: No pitting edema, palpable peripheral pulses.  MUSCULOSKELETAL SYSTEM: Unremarkable.  CENTRAL NERVOUS SYSTEM: No focal neurologic deficit on gross examination.   Hospital Course:  Active Problems:  1. Hypertensive urgency, malignant: Patient presented with markedly elevated blood pressure of 178/120, with symptoms of visual obscuration. He was managed with a combination of Norvasc/Lopressor and Clonidine, titrated judiciously, with satisfactory response. By 12/27/11, BP was ranging between 104/55-145/78, and patient was asymptomatic. 2. Diabetes mellitus: This is a new diagnosis.  Given body habitus, this is likely to be type 2, despite patient's youth. HBA1C is 15.8-16.6. He was managed with diet, SSI, meal cover and scheduled Lantus. As of 12/27/11, CBGs are now in the middle 100s. Patient underwent diabetic teaching, and instruction in self-administration of insulin. He will follow up at outpatient diabetic education class. 3. Renal insufficiency: Patient presented with ARF, a creatinine of 2.13, and appears to have underlying CKD, given HTN/DM and NSAID use. Unfortunately, baseline creatinine is unknown. Creatinine has improved, with ivi fluids, and is now 1.7-1.92, which is probably his baseline. Renal ultrasound of 12/25/22, showed bilateral echogenic kidneys compatible with chronic medical renal disease.  4. Abnormal EKG: This appears consistent with uncontrolled HTN. Cardiac enzymes were un-elevated, patient had no chest pain or SOB. 2D Echocardiogram of 12/25/11, showed normal left ventricular cavity size and ejection fraction of 55% to 60%. There were no regional wall motion abnormalities.  Comment: Patient was clinically stable for discharge on 12/27/11. Arrangements have been put in place to assist him in obtaining a primary MD, for on-going care.  Time spent on Discharge: 45 mins.  Signed: Teal Bontrager,CHRISTOPHER 12/27/2011, 4:14 PM

## 2011-12-27 NOTE — Discharge Instructions (Signed)
Follow up with outpatient diabetic education.

## 2013-12-20 ENCOUNTER — Observation Stay (HOSPITAL_COMMUNITY)
Admission: EM | Admit: 2013-12-20 | Discharge: 2013-12-24 | Disposition: A | Discharge status: Home / Self Care | Payer: Medicaid Other | Attending: Internal Medicine | Admitting: Internal Medicine

## 2013-12-20 ENCOUNTER — Emergency Department (HOSPITAL_COMMUNITY): Payer: Medicaid Other

## 2013-12-20 ENCOUNTER — Encounter (HOSPITAL_COMMUNITY): Payer: Self-pay | Admitting: Emergency Medicine

## 2013-12-20 DIAGNOSIS — E119 Type 2 diabetes mellitus without complications: Secondary | ICD-10-CM

## 2013-12-20 DIAGNOSIS — Z91199 Patient's noncompliance with other medical treatment and regimen due to unspecified reason: Secondary | ICD-10-CM | POA: Insufficient documentation

## 2013-12-20 DIAGNOSIS — E872 Acidosis, unspecified: Secondary | ICD-10-CM | POA: Insufficient documentation

## 2013-12-20 DIAGNOSIS — N179 Acute kidney failure, unspecified: Secondary | ICD-10-CM | POA: Insufficient documentation

## 2013-12-20 DIAGNOSIS — I509 Heart failure, unspecified: Secondary | ICD-10-CM | POA: Insufficient documentation

## 2013-12-20 DIAGNOSIS — Z6839 Body mass index (BMI) 39.0-39.9, adult: Secondary | ICD-10-CM | POA: Insufficient documentation

## 2013-12-20 DIAGNOSIS — Z9119 Patient's noncompliance with other medical treatment and regimen: Secondary | ICD-10-CM | POA: Insufficient documentation

## 2013-12-20 DIAGNOSIS — R0601 Orthopnea: Secondary | ICD-10-CM | POA: Insufficient documentation

## 2013-12-20 DIAGNOSIS — R809 Proteinuria, unspecified: Secondary | ICD-10-CM

## 2013-12-20 DIAGNOSIS — I160 Hypertensive urgency: Secondary | ICD-10-CM

## 2013-12-20 DIAGNOSIS — I10 Essential (primary) hypertension: Secondary | ICD-10-CM

## 2013-12-20 DIAGNOSIS — I161 Hypertensive emergency: Secondary | ICD-10-CM | POA: Diagnosis present

## 2013-12-20 DIAGNOSIS — N184 Chronic kidney disease, stage 4 (severe): Secondary | ICD-10-CM | POA: Insufficient documentation

## 2013-12-20 DIAGNOSIS — I5043 Acute on chronic combined systolic (congestive) and diastolic (congestive) heart failure: Secondary | ICD-10-CM

## 2013-12-20 DIAGNOSIS — I119 Hypertensive heart disease without heart failure: Secondary | ICD-10-CM

## 2013-12-20 DIAGNOSIS — R062 Wheezing: Secondary | ICD-10-CM | POA: Diagnosis present

## 2013-12-20 DIAGNOSIS — E875 Hyperkalemia: Secondary | ICD-10-CM | POA: Insufficient documentation

## 2013-12-20 DIAGNOSIS — I129 Hypertensive chronic kidney disease with stage 1 through stage 4 chronic kidney disease, or unspecified chronic kidney disease: Secondary | ICD-10-CM | POA: Insufficient documentation

## 2013-12-20 DIAGNOSIS — N189 Chronic kidney disease, unspecified: Secondary | ICD-10-CM | POA: Diagnosis present

## 2013-12-20 DIAGNOSIS — R0602 Shortness of breath: Secondary | ICD-10-CM | POA: Insufficient documentation

## 2013-12-20 DIAGNOSIS — Z794 Long term (current) use of insulin: Secondary | ICD-10-CM | POA: Insufficient documentation

## 2013-12-20 DIAGNOSIS — E1129 Type 2 diabetes mellitus with other diabetic kidney complication: Principal | ICD-10-CM | POA: Insufficient documentation

## 2013-12-20 HISTORY — DX: Heart disease, unspecified: I51.9

## 2013-12-20 HISTORY — DX: Obesity, unspecified: E66.9

## 2013-12-20 LAB — RAPID URINE DRUG SCREEN, HOSP PERFORMED
Amphetamines: NOT DETECTED
BARBITURATES: NOT DETECTED
BENZODIAZEPINES: NOT DETECTED
COCAINE: NOT DETECTED
Opiates: NOT DETECTED
TETRAHYDROCANNABINOL: NOT DETECTED

## 2013-12-20 LAB — CBC
HCT: 45.6 % (ref 39.0–52.0)
Hemoglobin: 14.8 g/dL (ref 13.0–17.0)
MCH: 26.7 pg (ref 26.0–34.0)
MCHC: 32.5 g/dL (ref 30.0–36.0)
MCV: 82.2 fL (ref 78.0–100.0)
PLATELETS: 280 10*3/uL (ref 150–400)
RBC: 5.55 MIL/uL (ref 4.22–5.81)
RDW: 13.8 % (ref 11.5–15.5)
WBC: 9.2 10*3/uL (ref 4.0–10.5)

## 2013-12-20 LAB — URINALYSIS, ROUTINE W REFLEX MICROSCOPIC
BILIRUBIN URINE: NEGATIVE
Glucose, UA: NEGATIVE mg/dL
KETONES UR: NEGATIVE mg/dL
Leukocytes, UA: NEGATIVE
Nitrite: NEGATIVE
Specific Gravity, Urine: 1.015 (ref 1.005–1.030)
UROBILINOGEN UA: 0.2 mg/dL (ref 0.0–1.0)
pH: 5 (ref 5.0–8.0)

## 2013-12-20 LAB — D-DIMER, QUANTITATIVE: D-Dimer, Quant: 0.32 ug/mL-FEU (ref 0.00–0.48)

## 2013-12-20 LAB — BASIC METABOLIC PANEL
BUN: 34 mg/dL — ABNORMAL HIGH (ref 6–23)
CALCIUM: 10.1 mg/dL (ref 8.4–10.5)
CO2: 22 mEq/L (ref 19–32)
Chloride: 105 mEq/L (ref 96–112)
Creatinine, Ser: 3.63 mg/dL — ABNORMAL HIGH (ref 0.50–1.35)
GFR calc Af Amer: 24 mL/min — ABNORMAL LOW (ref >90)
GFR, EST NON AFRICAN AMERICAN: 21 mL/min — AB (ref >90)
Glucose, Bld: 126 mg/dL — ABNORMAL HIGH (ref 70–99)
POTASSIUM: 4.8 meq/L (ref 3.7–5.3)
SODIUM: 144 meq/L (ref 137–147)

## 2013-12-20 LAB — URINE MICROSCOPIC-ADD ON

## 2013-12-20 LAB — GLUCOSE, CAPILLARY: Glucose-Capillary: 261 mg/dL — ABNORMAL HIGH (ref 70–99)

## 2013-12-20 LAB — I-STAT TROPONIN, ED: TROPONIN I, POC: 0.06 ng/mL (ref 0.00–0.08)

## 2013-12-20 LAB — PRO B NATRIURETIC PEPTIDE: Pro B Natriuretic peptide (BNP): 566.2 pg/mL — ABNORMAL HIGH (ref 0–125)

## 2013-12-20 MED ORDER — METOPROLOL TARTRATE 25 MG PO TABS
25.0000 mg | ORAL_TABLET | Freq: Two times a day (BID) | ORAL | Status: DC
  Administered 2013-12-21 – 2013-12-22 (×4): 25 mg via ORAL
  Filled 2013-12-20 (×5): qty 1

## 2013-12-20 MED ORDER — SODIUM CHLORIDE 0.9 % IV BOLUS (SEPSIS)
500.0000 mL | Freq: Once | INTRAVENOUS | Status: AC
  Administered 2013-12-20: 500 mL via INTRAVENOUS

## 2013-12-20 MED ORDER — INSULIN ASPART 100 UNIT/ML ~~LOC~~ SOLN
0.0000 [IU] | Freq: Every day | SUBCUTANEOUS | Status: DC
  Administered 2013-12-20: 3 [IU] via SUBCUTANEOUS

## 2013-12-20 MED ORDER — SODIUM CHLORIDE 0.9 % IV SOLN
INTRAVENOUS | Status: AC
  Administered 2013-12-20: via INTRAVENOUS

## 2013-12-20 MED ORDER — METHYLPREDNISOLONE SODIUM SUCC 125 MG IJ SOLR
125.0000 mg | Freq: Once | INTRAMUSCULAR | Status: AC
  Administered 2013-12-20: 125 mg via INTRAVENOUS
  Filled 2013-12-20: qty 2

## 2013-12-20 MED ORDER — AMLODIPINE BESYLATE 10 MG PO TABS
10.0000 mg | ORAL_TABLET | Freq: Every day | ORAL | Status: DC
  Administered 2013-12-21 – 2013-12-24 (×4): 10 mg via ORAL
  Filled 2013-12-20 (×4): qty 1

## 2013-12-20 MED ORDER — ALBUTEROL (5 MG/ML) CONTINUOUS INHALATION SOLN
10.0000 mg/h | INHALATION_SOLUTION | Freq: Once | RESPIRATORY_TRACT | Status: AC
  Administered 2013-12-20: 10 mg/h via RESPIRATORY_TRACT
  Filled 2013-12-20: qty 20

## 2013-12-20 MED ORDER — ACETAMINOPHEN 650 MG RE SUPP: 650.0000 mg | Freq: Four times a day (QID) | RECTAL | Status: DC | PRN

## 2013-12-20 MED ORDER — METOPROLOL TARTRATE 25 MG PO TABS: 25.0000 mg | ORAL_TABLET | Freq: Once | ORAL | Status: DC

## 2013-12-20 MED ORDER — SODIUM CHLORIDE 0.9 % IJ SOLN
3.0000 mL | Freq: Two times a day (BID) | INTRAMUSCULAR | Status: DC
Start: 2013-12-20 — End: 2013-12-24
  Administered 2013-12-20 – 2013-12-24 (×8): 3 mL via INTRAVENOUS

## 2013-12-20 MED ORDER — ALBUTEROL SULFATE (2.5 MG/3ML) 0.083% IN NEBU
INHALATION_SOLUTION | RESPIRATORY_TRACT | Status: AC
  Filled 2013-12-20: qty 6

## 2013-12-20 MED ORDER — ACETAMINOPHEN 325 MG PO TABS
650.0000 mg | ORAL_TABLET | Freq: Four times a day (QID) | ORAL | Status: DC | PRN
  Administered 2013-12-21 – 2013-12-23 (×4): 650 mg via ORAL
  Filled 2013-12-20 (×4): qty 2

## 2013-12-20 MED ORDER — ALBUTEROL SULFATE (2.5 MG/3ML) 0.083% IN NEBU
5.0000 mg | INHALATION_SOLUTION | Freq: Once | RESPIRATORY_TRACT | Status: AC
  Administered 2013-12-20: 5 mg via RESPIRATORY_TRACT
  Filled 2013-12-20: qty 6

## 2013-12-20 MED ORDER — HYDRALAZINE HCL 20 MG/ML IJ SOLN
10.0000 mg | Freq: Once | INTRAMUSCULAR | Status: AC
  Administered 2013-12-20: 10 mg via INTRAVENOUS
  Filled 2013-12-20: qty 1

## 2013-12-20 MED ORDER — ONDANSETRON HCL 4 MG PO TABS: 4.0000 mg | ORAL_TABLET | Freq: Four times a day (QID) | ORAL | Status: DC | PRN

## 2013-12-20 MED ORDER — CLONIDINE HCL 0.2 MG PO TABS
0.2000 mg | ORAL_TABLET | Freq: Once | ORAL | Status: AC
  Administered 2013-12-20: 0.2 mg via ORAL
  Filled 2013-12-20: qty 1

## 2013-12-20 MED ORDER — INSULIN ASPART 100 UNIT/ML ~~LOC~~ SOLN
0.0000 [IU] | Freq: Three times a day (TID) | SUBCUTANEOUS | Status: DC
  Administered 2013-12-21 – 2013-12-22 (×4): 2 [IU] via SUBCUTANEOUS
  Administered 2013-12-22 – 2013-12-23 (×5): 1 [IU] via SUBCUTANEOUS

## 2013-12-20 MED ORDER — AMLODIPINE BESYLATE 10 MG PO TABS
10.0000 mg | ORAL_TABLET | Freq: Once | ORAL | Status: AC
  Administered 2013-12-20: 10 mg via ORAL
  Filled 2013-12-20: qty 1

## 2013-12-20 MED ORDER — METOPROLOL TARTRATE 1 MG/ML IV SOLN
10.0000 mg | Freq: Once | INTRAVENOUS | Status: AC
  Administered 2013-12-20: 10 mg via INTRAVENOUS
  Filled 2013-12-20: qty 10

## 2013-12-20 MED ORDER — ONDANSETRON HCL 4 MG/2ML IJ SOLN: 4.0000 mg | Freq: Four times a day (QID) | INTRAMUSCULAR | Status: DC | PRN

## 2013-12-20 MED ORDER — HEPARIN SODIUM (PORCINE) 5000 UNIT/ML IJ SOLN
5000.0000 [IU] | Freq: Three times a day (TID) | INTRAMUSCULAR | Status: DC
  Administered 2013-12-21 – 2013-12-24 (×12): 5000 [IU] via SUBCUTANEOUS
  Filled 2013-12-20 (×15): qty 1

## 2013-12-20 MED ORDER — ONDANSETRON HCL 4 MG/2ML IJ SOLN
4.0000 mg | Freq: Once | INTRAMUSCULAR | Status: AC
  Administered 2013-12-20: 4 mg via INTRAVENOUS
  Filled 2013-12-20: qty 2

## 2013-12-20 NOTE — ED Notes (Signed)
Pt having SOB that started last night. sts also some neck pain. Pt SOB when talking. Denies chest pain. Pt audible wheezing. Pt hypertensive at 225/155.

## 2013-12-20 NOTE — ED Notes (Signed)
Wife, Wylliam Hanek phone number: [phone]

## 2013-12-20 NOTE — ED Provider Notes (Signed)
CSN: [id number]     Arrival date & time 12/20/13  1735 History   First MD Initiated Contact with Patient 12/20/13 1850     Chief Complaint  Patient presents with  . Shortness of Breath     (Consider location/radiation/quality/duration/timing/severity/associated sxs/prior Treatment) HPI Comments: Patient is a 33 year old male who presents to the emergency department with complaint of shortness of breath, neck pain, and headache. The patient states that on yesterday April 19 he began to have some shortness of breath. He was able to rest during the night, but this morning he noted more shortness of breath, wheezing, neck pain, and headache. [name] wife states that the patient could not speak in complete sentences without having to take a deep breath at times. The patient denies any recent injury or trauma of any kind to the head neck or chest. He states that [place] has [place], and he thought that this may have something to do with the wheezing. The patient has not had any high fever, she's not had any difficulty with urination. There's been no nausea vomiting reported. The patient has not noted any changes in his stool. He is not a smoker. He denies any recreational drugs of any kind. He presents now for assistance with these multiple problems.  Patient is a 33 y.o. male presenting with shortness of breath. The history is provided by the patient.  Shortness of Breath Associated symptoms: headaches, neck pain and wheezing   Associated symptoms: no abdominal pain, no chest pain and no cough     Past Medical History  Diagnosis Date  . Hypertension    History reviewed. No pertinent past surgical history. History reviewed. No pertinent family history. History  Substance Use Topics  . Smoking status: Never Smoker   . Smokeless tobacco: Not on file  . Alcohol Use: Yes    Review of Systems  Constitutional: Negative for activity change.       All ROS Neg except as  noted in HPI  HENT: Negative for nosebleeds.   Eyes: Negative for photophobia and discharge.  Respiratory: Positive for shortness of breath and wheezing. Negative for cough.   Cardiovascular: Negative for chest pain and palpitations.  Gastrointestinal: Negative for abdominal pain and blood in stool.  Genitourinary: Negative for dysuria, frequency and hematuria.  Musculoskeletal: Positive for neck pain. Negative for arthralgias and back pain.  Skin: Negative.   Neurological: Positive for headaches. Negative for dizziness, seizures and speech difficulty.  Psychiatric/Behavioral: Negative for hallucinations and confusion.      Allergies  Review of patient's allergies indicates no known allergies.  Home Medications   Prior to Admission medications   Medication Sig Start Date End Date Taking? Authorizing Provider  amLODipine (NORVASC) 10 MG tablet Take 1 tablet (10 mg total) by mouth daily. 12/27/11 12/26/12  Monika Salk, MD  cloNIDine (CATAPRES) 0.2 MG tablet Take 1 tablet (0.2 mg total) by mouth 3 (three) times daily. 12/27/11 12/26/12  Monika Salk, MD  insulin aspart (NOVOLOG) 100 UNIT/ML injection For CBG: 70-120, No Insulin; CBG: 121-150, 2 units; CBG 151-200, 3 units; CBG: 201-250, 5 units.; CBG 251-300, 8 units; CBG: 301-350, 11 units; CBG: 351-400, 15 units. 12/27/11   Monika Salk, MD  insulin glargine (LANTUS) 100 UNIT/ML injection Inject 15 Units into the skin 2 (two) times daily. 12/27/11 12/26/12  Monika Salk, MD  metoprolol tartrate (LOPRESSOR) 25 MG tablet Take 1 tablet (25 mg total) by mouth 2 (  two) times daily. 12/27/11 12/26/12  Monika Salk, MD  Multiple Vitamins-Minerals (MULTIVITAMINS THER. W/MINERALS) TABS Take 1 tablet by mouth daily.    Historical Provider, MD   BP 225/155  Pulse 114  Temp(Src) 98.4 F (36.9 C)  Resp 24  Wt 315 lb (142.883 kg)  SpO2 96% Physical Exam  Nursing note and vitals reviewed. Constitutional: He is oriented to person, place, and time. He appears  well-developed and well-nourished.  Non-toxic appearance.  HENT:  Head: Normocephalic.  Right Ear: Tympanic membrane and external ear normal.  Left Ear: Tympanic membrane and external ear normal.  Eyes: Conjunctivae, EOM and lids are normal. Pupils are equal, round, and reactive to light.  Fundoscopic exam:      The right eye shows no AV nicking, no exudate, no hemorrhage and no papilledema.       The left eye shows no AV nicking, no exudate, no hemorrhage and no papilledema.  Neck: Normal range of motion. Neck supple. Carotid bruit is not present.  Cardiovascular: Regular rhythm, normal heart sounds, intact distal pulses and normal pulses.  Tachycardia present.   Pulmonary/Chest: Breath sounds normal. No respiratory distress.  There is soft anterior wheezes noted when the patient is taking has normal breath.  The PMI is displaced.  Abdominal: Soft. Bowel sounds are normal. There is no tenderness. There is no guarding.  Musculoskeletal: Normal range of motion. He exhibits no edema.  Lymphadenopathy:       Head (right side): No submandibular adenopathy present.       Head (left side): No submandibular adenopathy present.    He has no cervical adenopathy.  Neurological: He is alert and oriented to person, place, and time. He has normal strength. No cranial nerve deficit or sensory deficit. He exhibits normal muscle tone. Coordination normal.  Skin: Skin is warm and dry.  Psychiatric: He has a normal mood and affect. His speech is normal.    ED Course Patient seen with me by Dr. Darl Householder.   Procedures (including critical care time) Labs Review Labs Reviewed  BASIC METABOLIC PANEL - Abnormal; Notable for the following:    Glucose, Bld 126 (*)    BUN 34 (*)    Creatinine, Ser 3.63 (*)    GFR calc non Af Amer 21 (*)    GFR calc Af Amer 24 (*)    All other components within normal limits  PRO B NATRIURETIC PEPTIDE - Abnormal; Notable for the following:    Pro B Natriuretic peptide (BNP)  566.2 (*)    All other components within normal limits  CBC  I-STAT TROPOININ, ED    Imaging Review No results found.   EKG Interpretation None      MDM Patient received a nebulizer treatment in the triage. The patient states that the neck pain, headache, and wheezing seem to be some better after the nebulizer treatment. The patient still seems to be short of breath, he is tachypneic at about 26 breaths per minute. He remains tachycardic.  The i-STAT troponin is normal at 0.06. EKG  showes NSR at 112, poor r wave progression v1-v4.  The complete blood count is normal. The basic metabolic panel shows a glucose to be 126, the BUN is elevated at 34, the creatinine is elevated at 3.63, the glomerular filtration rate is low at 24. The been x-rayed it peptide is elevated at 566.2. The chest x-ray shows the lungs to be clear and negative for any pulmonary edema or acute changes.  IV  established. Patient will be given Lopressor 10 mg IV, along with Catapres 0.2 orally and Norvasc 10 mg orally.  Called to Pt's room at 8:22pm because pulse ox is down to 84, respiratory rate increasing. IV solumedrol and albuterol neb given to the patient.  Pt's care to be continued by Dr Darl Householder.   Final diagnoses:  None    *I have reviewed nursing notes, vital signs, and all appropriate lab and imaging results for this patient.Lenox Ahr, PA-C 12/22/13 1840

## 2013-12-20 NOTE — ED Notes (Signed)
Pt in radiology 

## 2013-12-20 NOTE — ED Notes (Addendum)
Pt getting albuterol atrovent breathing tx- pt o2 stats dropped to 83% RA.

## 2013-12-20 NOTE — ED Notes (Signed)
Pt vomited x1. MD notified.

## 2013-12-20 NOTE — ED Notes (Signed)
Lab states that they do not have d-dimer that was sent earlier. Will recollect.

## 2013-12-20 NOTE — H&P (Signed)
[facility] History and Physical  Patient: Patrick Ibarra  [id number]  DOB: 09-Nov-1980  DOS: the patient was seen and examined on 12/20/2013 PCP: Default, Provider, MD  Chief Complaint: Shortness of breath  HPI: Patrick Ibarra is a 33 y.o. male with Past medical history of hypertension and diabetes. The patient presented with complaints of shortness of breath ongoing since last one week. He described his symptoms primarily on exertion. He also describes orthopnea and PND since last 2 days. He has at his baseline apnea when he is breathing in his sleep. He denies any weight gain or leg swelling or abdominal distention. He denies any fever, cough, chills, chest pain. Denies any nausea or vomiting or abdominal discomfort. No diarrhea or burning urination. She was diagnosed with hypertension and diabetes in the past and was given medications for both. He has taken medications for diabetes and has stopped taking hypertensive medications almost a year ago. He has not followed up with a physician after discharge. He denies use of any herbal supplements or over-the-counter medications. He denies any sick contacts or recent travel  The patient is coming from home. And at her baseline Independent for most of his  ADL.  Review of Systems: as mentioned in the history of present illness.  A Comprehensive review of the other systems is negative.  Past Medical History  Diagnosis Date  . Hypertension    History reviewed. No pertinent past surgical history. Social History:  reports that he has never smoked. He does not have any smokeless tobacco history on file. He reports that he drinks alcohol. His drug history is not on file.  No Known Allergies  History reviewed. No pertinent family history.  Prior to Admission medications   Medication Sig Start Date End Date Taking? Authorizing Provider  insulin aspart (NOVOLOG) 100 UNIT/ML injection For CBG: 70-120, No Insulin; CBG: 121-150, 2 units;  CBG 151-200, 3 units; CBG: 201-250, 5 units.; CBG 251-300, 8 units; CBG: 301-350, 11 units; CBG: 351-400, 15 units. 12/27/11  Yes Monika Salk, MD  amLODipine (NORVASC) 10 MG tablet Take 1 tablet (10 mg total) by mouth daily. 12/27/11 12/26/12  Monika Salk, MD  cloNIDine (CATAPRES) 0.2 MG tablet Take 1 tablet (0.2 mg total) by mouth 3 (three) times daily. 12/27/11 12/26/12  Monika Salk, MD  insulin glargine (LANTUS) 100 UNIT/ML injection Inject 15 Units into the skin 2 (two) times daily. 12/27/11 12/26/12  Monika Salk, MD  metoprolol tartrate (LOPRESSOR) 25 MG tablet Take 1 tablet (25 mg total) by mouth 2 (two) times daily. 12/27/11 12/26/12  Monika Salk, MD    Physical Exam: Filed Vitals:   12/20/13 2031 12/20/13 2100 12/20/13 2130 12/20/13 2200  BP: 218/190 188/112 194/92 133/42  Pulse:  130 131 129  Temp:      Resp: 22 30 21 27   Weight:      SpO2: 99% 98% 100% 96%    General: Alert, Awake and Oriented to Time, Place and Person. Appear in mild distress Eyes: PERRL ENT: Oral Mucosa clear moist. Neck: no JVD Cardiovascular: S1 and S2 Present, no Murmur, Peripheral Pulses Present Respiratory: Bilateral Air entry equal and Decreased, no Crackles, occasional wheezes Abdomen: Bowel Sound Present, Soft and Non tender Skin: no Rash Extremities: no Pedal edema, no calf tenderness Neurologic: Grossly Unremarkable.  Labs on Admission:  CBC:  Recent Labs Lab 12/20/13 1800  WBC 9.2  HGB 14.8  HCT 45.6  MCV 82.2  PLT 280  CMP     Component Value Date/Time   NA 144 12/20/2013 1800   K 4.8 12/20/2013 1800   CL 105 12/20/2013 1800   CO2 22 12/20/2013 1800   GLUCOSE 126* 12/20/2013 1800   BUN 34* 12/20/2013 1800   CREATININE 3.63* 12/20/2013 1800   CALCIUM 10.1 12/20/2013 1800   PROT 7.7 12/26/2011 0455   ALBUMIN 2.8* 12/26/2011 0455   AST 64* 12/26/2011 0455   ALT 55* 12/26/2011 0455   ALKPHOS 81 12/26/2011 0455   BILITOT 0.4 12/26/2011 0455   GFRNONAA 21* 12/20/2013 1800   GFRAA 24* 12/20/2013  1800    No results found for this basename: LIPASE, AMYLASE,  in the last 168 hours No results found for this basename: AMMONIA,  in the last 168 hours  No results found for this basename: CKTOTAL, CKMB, CKMBINDEX, TROPONINI,  in the last 168 hours BNP (last 3 results)  Recent Labs  12/20/13 1800  PROBNP 566.2*    Radiological Exams on Admission: Dg Chest 2 View  12/20/2013   CLINICAL DATA:  Shortness of breath  EXAM: CHEST  2 VIEW  COMPARISON:  None.  FINDINGS: The lungs are clear and negative for focal airspace consolidation, pulmonary edema or suspicious pulmonary nodule. No pleural effusion or pneumothorax. Cardiac and mediastinal contours are within normal limits. No acute fracture or lytic or blastic osseous lesions. The visualized upper abdominal bowel gas pattern is unremarkable.  IMPRESSION: No active cardiopulmonary disease.   Electronically Signed   By: Jacqulynn Cadet M.D.   On: 12/20/2013 19:02    EKG: Independently reviewed. nonspecific ST and T waves changes, sinus tachycardia.  Assessment/Plan Principal Problem:   Hypertensive emergency Active Problems:   Acute-on-chronic kidney injury   Wheezing   1. Hypertensive emergency The patient is presenting with significantly elevated blood pressure with sinus tachycardia. Most likely cause of his elevated blood pressure is noncompliant with his medication. this could lead to diastolic dysfunction which could cause symptoms of orthopnea and PND in his case. At present patient will be admitted for observation. I will follow serial troponin. His blood pressure has dropped down significantly from his presentation, from [id number] systolic. Therefore I would hold off on giving him any other acute antihypertensive medication and to resume antihypertensive medications Norvasc and metoprolol from tomorrow. I would avoid using clonidine due to his history of noncompliance to avoid potential withdrawal. I will check echocardiogram in  the morning for further workup.  2. Acute on chronic kidney disease Most likely due to progressive damage due to hypertension and diabetes. Ultrasound renal does not have any evidence of obstruction. Nephrology consulted who will follow the patient.  3. Wheezing Multifactorial at present I would continue him on nebulizations. Check influenza sputum culture.  4. Diabetes Check hemoglobin A1c and sliding scale  Consults: Nephrology  DVT Prophylaxis: subcutaneous Heparin Nutrition: Cardiac and renal and diabetic diet  Code Status: Full  Family Communication: Family was present at bedside, opportunity was given to ask question and all questions were answered satisfactorily at the time of interview. Disposition: Admitted to observation in telemetry unit.  Author: Berle Mull, MD [facility] Pager: [phone] 12/20/2013, 10:53 PM    If 7PM-7AM, please contact night-coverage [email or web address] Password [email or web address]

## 2013-12-21 ENCOUNTER — Observation Stay (HOSPITAL_COMMUNITY): Payer: Medicaid Other

## 2013-12-21 DIAGNOSIS — R062 Wheezing: Secondary | ICD-10-CM | POA: Diagnosis present

## 2013-12-21 DIAGNOSIS — I10 Essential (primary) hypertension: Secondary | ICD-10-CM

## 2013-12-21 DIAGNOSIS — N189 Chronic kidney disease, unspecified: Secondary | ICD-10-CM

## 2013-12-21 DIAGNOSIS — N179 Acute kidney failure, unspecified: Secondary | ICD-10-CM | POA: Diagnosis present

## 2013-12-21 DIAGNOSIS — R809 Proteinuria, unspecified: Secondary | ICD-10-CM

## 2013-12-21 LAB — URINALYSIS, ROUTINE W REFLEX MICROSCOPIC
Bilirubin Urine: NEGATIVE
Glucose, UA: NEGATIVE mg/dL
Hgb urine dipstick: NEGATIVE
Ketones, ur: NEGATIVE mg/dL
Leukocytes, UA: NEGATIVE
Nitrite: NEGATIVE
Protein, ur: 100 mg/dL — AB
Specific Gravity, Urine: 1.015 (ref 1.005–1.030)
Urobilinogen, UA: 0.2 mg/dL (ref 0.0–1.0)
pH: 5 (ref 5.0–8.0)

## 2013-12-21 LAB — URINE MICROSCOPIC-ADD ON

## 2013-12-21 LAB — CBC WITH DIFFERENTIAL/PLATELET
BASOS PCT: 0 % (ref 0–1)
Basophils Absolute: 0 10*3/uL (ref 0.0–0.1)
EOS ABS: 0 10*3/uL (ref 0.0–0.7)
EOS PCT: 0 % (ref 0–5)
HCT: 40.1 % (ref 39.0–52.0)
Hemoglobin: 13 g/dL (ref 13.0–17.0)
Lymphocytes Relative: 7 % — ABNORMAL LOW (ref 12–46)
Lymphs Abs: 1 10*3/uL (ref 0.7–4.0)
MCH: 26.6 pg (ref 26.0–34.0)
MCHC: 32.4 g/dL (ref 30.0–36.0)
MCV: 82 fL (ref 78.0–100.0)
MONO ABS: 0.1 10*3/uL (ref 0.1–1.0)
Monocytes Relative: 0 % — ABNORMAL LOW (ref 3–12)
Neutro Abs: 13.4 10*3/uL — ABNORMAL HIGH (ref 1.7–7.7)
Neutrophils Relative %: 93 % — ABNORMAL HIGH (ref 43–77)
PLATELETS: 297 10*3/uL (ref 150–400)
RBC: 4.89 MIL/uL (ref 4.22–5.81)
RDW: 13.9 % (ref 11.5–15.5)
WBC: 14.5 10*3/uL — ABNORMAL HIGH (ref 4.0–10.5)

## 2013-12-21 LAB — HEMOGLOBIN A1C
HEMOGLOBIN A1C: 7.6 % — AB (ref <5.7)
Hgb A1c MFr Bld: 7.5 % — ABNORMAL HIGH (ref <5.7)
MEAN PLASMA GLUCOSE: 169 mg/dL — AB (ref <117)
MEAN PLASMA GLUCOSE: 171 mg/dL — AB (ref <117)

## 2013-12-21 LAB — COMPREHENSIVE METABOLIC PANEL
ALBUMIN: 3.2 g/dL — AB (ref 3.5–5.2)
ALT: 16 U/L (ref 0–53)
AST: 17 U/L (ref 0–37)
Alkaline Phosphatase: 60 U/L (ref 39–117)
BUN: 38 mg/dL — ABNORMAL HIGH (ref 6–23)
CALCIUM: 9.6 mg/dL (ref 8.4–10.5)
CO2: 19 mEq/L (ref 19–32)
CREATININE: 3.79 mg/dL — AB (ref 0.50–1.35)
Chloride: 102 mEq/L (ref 96–112)
GFR calc Af Amer: 23 mL/min — ABNORMAL LOW (ref >90)
GFR calc non Af Amer: 20 mL/min — ABNORMAL LOW (ref >90)
Glucose, Bld: 257 mg/dL — ABNORMAL HIGH (ref 70–99)
Potassium: 5.6 mEq/L — ABNORMAL HIGH (ref 3.7–5.3)
SODIUM: 139 meq/L (ref 137–147)
TOTAL PROTEIN: 8.6 g/dL — AB (ref 6.0–8.3)
Total Bilirubin: 0.4 mg/dL (ref 0.3–1.2)

## 2013-12-21 LAB — TROPONIN I
Troponin I: <0.3 ng/mL (ref <0.30)
Troponin I: <0.3 ng/mL (ref <0.30)
Troponin I: <0.3 ng/mL (ref <0.30)

## 2013-12-21 LAB — TSH: TSH: 0.805 u[IU]/mL (ref 0.350–4.500)

## 2013-12-21 LAB — PROTEIN / CREATININE RATIO, URINE
Creatinine, Urine: 70.85 mg/dL
Protein Creatinine Ratio: 1.21 — ABNORMAL HIGH (ref 0.00–0.15)
Total Protein, Urine: 85.7 mg/dL

## 2013-12-21 LAB — GLUCOSE, CAPILLARY
GLUCOSE-CAPILLARY: 198 mg/dL — AB (ref 70–99)
Glucose-Capillary: 187 mg/dL — ABNORMAL HIGH (ref 70–99)
Glucose-Capillary: 178 mg/dL — ABNORMAL HIGH (ref 70–99)
Glucose-Capillary: 155 mg/dL — ABNORMAL HIGH (ref 70–99)

## 2013-12-21 MED ORDER — FUROSEMIDE 20 MG PO TABS
20.0000 mg | ORAL_TABLET | Freq: Every day | ORAL | Status: DC
  Administered 2013-12-21 – 2013-12-24 (×4): 20 mg via ORAL
  Filled 2013-12-21 (×4): qty 1

## 2013-12-21 MED ORDER — HYDRALAZINE HCL 20 MG/ML IJ SOLN
10.0000 mg | Freq: Once | INTRAMUSCULAR | Status: AC
  Administered 2013-12-21: 10 mg via INTRAVENOUS
  Filled 2013-12-21: qty 1

## 2013-12-21 MED ORDER — IPRATROPIUM-ALBUTEROL 0.5-2.5 (3) MG/3ML IN SOLN: 3.0000 mL | Freq: Four times a day (QID) | RESPIRATORY_TRACT | Status: DC | PRN

## 2013-12-21 NOTE — Progress Notes (Signed)
Lab called. Can't add on triponin because it was too late so will have to redraw triponins. Pt's RN made aware. Phlebotomy to redraw labs

## 2013-12-21 NOTE — Progress Notes (Signed)
NURSING PROGRESS NOTE  ROCKIE HANSLEY [id number] Admission Data: 12/21/2013 3:03 AM Attending Provider: Berle Mull, MD EO:[id number], Provider, MD Code Status: FULL CODE  DAIRON SPOSATO is a 33 y.o. male patient admitted from ED:  -No acute distress noted.  -No complaints of shortness of breath.  -No complaints of chest pain.   Cardiac Monitoring: Box # TX #13 in place. Cardiac monitor yields:ST.  Blood pressure 133/78, pulse 119, temperature 98.5 F (36.9 C), temperature source Oral, resp. rate 28, height 6\' 1"  (1.854 m), weight 136.5 kg (300 lb 14.9 oz), SpO2 94.00%.   IV Fluids:  IV in place, occlusive dsg intact without redness, IV cath forearm left, condition patent and no redness normal saline.   Allergies:  Review of patient's allergies indicates no known allergies.  Past Medical History:   has a past medical history of Hypertension.  Past Surgical History:   has no past surgical history on file.  Social History:   reports that he has never smoked. He does not have any smokeless tobacco history on file. He reports that he drinks alcohol. He reports that he does not use illicit drugs.  Skin: NSI  Patient/Family orientated to room. Information packet given to patient/family. Admission inpatient armband information verified with patient/family to include name and date of birth and placed on patient arm. Side rails up x 2, fall assessment and education completed with patient/family. Patient/family able to verbalize understanding of risk associated with falls and verbalized understanding to call for assistance before getting out of bed. Call light within reach. Patient/family able to voice and demonstrate understanding of unit orientation instructions.    Will continue to evaluate and treat per MD orders.

## 2013-12-21 NOTE — Progress Notes (Signed)
[facility] PROGRESS NOTE   Patrick Ibarra [id number] DOB: 1981-03-29 DOA: 12/20/2013 PCP: Default, Provider, MD  HPI 33 y.o. M, PMH: HTN and DM. In the ED pt c/o shortness of breath ongoing since last one week, primarily on exertion. He also describes orthopnea and PND since last 2 days. He has at his baseline apnea when he is breathing in his sleep. He denied any weight gain or leg swelling or abdominal distention. He denies any fever, cough, chills, chest pain. Denies any nausea or vomiting or abdominal discomfort. No diarrhea or burning urination.  He was diagnosed with hypertension and diabetes in the past and was given medications for both. He has stopped medications for diabetes and hypertensive almost a year ago.    Subjective: Pt is awake and in no distress. Denies any complaints at this time.  Assessment/Plan: Principal Problem:   Hypertensive emergency Active Problems:   Diabetes mellitus   Acute-on-chronic kidney injury   Wheezing      Hypertensive emergency    Elevated BP w/ ST when evaluated in the ED, pt has been noncompliant with medication regimen    Serial troponins are normal, ECG normal    BP normalized with Catapres x1 and hydralazine x1, none further due to possible withdrawal symptoms    Resuming Norvasc and Metoprolol for further treatment        Acute-on-chronic kidney injury    Possibly due to hypertensive emergency and worsening of his CKD, sCr 3.79 (1.92 12/27/11), +Proteinuria.    Renal U/S: no evidence of obstruction, findings consistent with chronic renal dz    Reinstitute HTN meds and stabilize    Nephrology consulted who will follow the patient.    Wheezing    Wheezing and DOE getting worse x 1 week    CXR-no cardiopulmonary processes, edema or infiltrates    Clear breath sounds and no SOB with activity post albuterol neb and solu-medrol    Begin to wean off the oxygen    Diabetes mellitus    Glucose checks >100 mg/dL on multiple  checks since 12/20/13    HgA1c 15.8-16.6 12/24/11    Noncompliant with medications/insulin for about 1 year    Begin Lantus and Aspart          DVT Prophylaxis: Heparin SQ Code Status: FULL Family Communication: Plan discussed with the patient and family at bedside. Disposition Plan: Remains inpatient  Consultants:  Nephrology  Objective: Filed Vitals:   12/21/13 0519  BP: 172/99  Pulse: 100  Temp: 98.3 F (36.8 C)  Resp: 20    Intake/Output Summary (Last 24 hours) at 12/21/13 1216 Last data filed at 12/21/13 0524  Gross per 24 hour  Intake      0 ml  Output    600 ml  Net   -600 ml   Filed Weights   12/20/13 1744 12/20/13 2305 12/21/13 0519  Weight: 142.883 kg (315 lb) 136.5 kg (300 lb 14.9 oz) 136 kg (299 lb 13.2 oz)    Exam: General: Alert and awake, oriented x3, not in any acute distress. HEENT: anicteric sclera, pupils reactive to light and accommodation, EOMI, moist mucus membrances CVS: S1-S2 clear, no murmur rubs or gallops Chest: clear to auscultation bilaterally, no wheezing, rales or rhonchi Abdomen: soft nontender, nondistended, normal bowel sounds, no organomegaly Extremities: no cyanosis, clubbing or edema noted bilaterally Neuro: Cranial nerves II-XII intact, no focal neurological deficits  Data Reviewed: Basic Metabolic Panel:  Recent Labs Lab 12/20/13 1800 12/21/13 0510  NA  144 139  K 4.8 5.6*  CL 105 102  CO2 22 19  GLUCOSE 126* 257*  BUN 34* 38*  CREATININE 3.63* 3.79*  CALCIUM 10.1 9.6   Liver Function Tests:  Recent Labs Lab 12/21/13 0510  AST 17  ALT 16  ALKPHOS 60  BILITOT 0.4  PROT 8.6*  ALBUMIN 3.2*   CBC:  Recent Labs Lab 12/20/13 1800 12/21/13 0510  WBC 9.2 14.5*  NEUTROABS  --  13.4*  HGB 14.8 13.0  HCT 45.6 40.1  MCV 82.2 82.0  PLT 280 297   Cardiac Enzymes:  Recent Labs Lab 12/21/13 0017 12/21/13 0451 12/21/13 1109  TROPONINI <0.30 <0.30 <0.30   BNP (last 3 results)  Recent Labs   12/20/13 1800  PROBNP 566.2*   CBG:  Recent Labs Lab 12/20/13 2349 12/21/13 0738  GLUCAP 261* 187*    Studies: Dg Chest 2 View  12/20/2013   CLINICAL DATA:  Shortness of breath  EXAM: CHEST  2 VIEW  COMPARISON:  None.  FINDINGS: The lungs are clear and negative for focal airspace consolidation, pulmonary edema or suspicious pulmonary nodule. No pleural effusion or pneumothorax. Cardiac and mediastinal contours are within normal limits. No acute fracture or lytic or blastic osseous lesions. The visualized upper abdominal bowel gas pattern is unremarkable.  IMPRESSION: No active cardiopulmonary disease.   Electronically Signed   By: Jacqulynn Cadet M.D.   On: 12/20/2013 19:02   US Renal  12/21/2013   CLINICAL DATA:  Acute on chronic kidney disease. Prior medical renal disease.  EXAM: RENAL/URINARY TRACT ULTRASOUND COMPLETE  COMPARISON:  US RENAL dated 12/25/2011  FINDINGS: Right Kidney:  Length: 10 cm. Increased parenchymal echotexture diffusely consistent with chronic medical renal disease. No hydronephrosis.  Left Kidney:  Length: 11.4 cm. Increased parenchymal echotexture diffusely consistent with chronic medical renal disease. No hydronephrosis.  Bladder:  No wall thickening or filling defect. Bilateral urine flow jets are identified.  No significant changes since prior study.  IMPRESSION: Increase renal parenchymal echotexture bilaterally consistent with chronic medical renal disease. No hydronephrosis.   Electronically Signed   By: Lucienne Capers M.D.   On: 12/21/2013 00:46    Scheduled Meds: . amLODipine  10 mg Oral Daily  . heparin  5,000 Units Subcutaneous 3 times per day  . insulin aspart  0-5 Units Subcutaneous QHS  . insulin aspart  0-9 Units Subcutaneous TID WC  . metoprolol tartrate  25 mg Oral BID  . sodium chloride  3 mL Intravenous Q12H    Time spent: 35 minutes    Myrla Halsted PA-S   [facility] Pager [phone] If 7PM-7AM, please contact night-coverage  at [email or web address], password [facility] 12/21/2013, 12:16 PM  LOS: 1 day      Addendum  Patient seen and examined, chart and data base reviewed.  I agree with the above assessment and plan.  For full details please see Mrs. Myrla Halsted PA-S note.  I reviewed and addended the above note is needed.   Birdie Hopes, MD [facility] Pager: [phone] 12/21/2013, 1:17 PM

## 2013-12-21 NOTE — Consult Note (Signed)
Reason for Consult:AKI/CKD and urgent Hypertension Referring Physician: Jhalil Ringstaff is an 33 y.o. male.  HPI: Pt is a 33yo AAM with PMH sig for DM and HTN that have been poorly controlled over the last 2 years.  He was last seen in 4/13 with Scr of 1.9-2 and has not seen a physician, nor has he taking BP medications.  He presented to [facility] ED on 12/21/13 with worsening SOB and was noted to have a BP of 218/190 and Scr of 3.63.  He was admitted for emergent HTN and started on medications with improvement of his BP.  We were asked to evaluate and manage his CKD which is likely progressive over the last 2 years due to poorly controlled Diabetes and hypertensive nephrosclerosis.  The trend in Scr is seen below.  Trend in Creatinine: Creatinine, Ser  Date/Time Value Ref Range Status  12/21/2013  5:10 AM 3.79* 0.50 - 1.35 mg/dL Final  12/20/2013  6:00 PM 3.63* 0.50 - 1.35 mg/dL Final  12/27/2011  5:05 AM 1.92* 0.50 - 1.35 mg/dL Final  12/26/2011  4:55 AM 1.79* 0.50 - 1.35 mg/dL Final  12/25/2011  3:30 AM 1.96* 0.50 - 1.35 mg/dL Final  12/24/2011  4:19 PM 2.13* 0.50 - 1.35 mg/dL Final    PMH:   Past Medical History  Diagnosis Date  . Hypertension     PSH:  History reviewed. No pertinent past surgical history.  Allergies: No Known Allergies  Medications:   Prior to Admission medications   Medication Sig Start Date End Date Taking? Authorizing Provider  insulin aspart (NOVOLOG) 100 UNIT/ML injection For CBG: 70-120, No Insulin; CBG: 121-150, 2 units; CBG 151-200, 3 units; CBG: 201-250, 5 units.; CBG 251-300, 8 units; CBG: 301-350, 11 units; CBG: 351-400, 15 units. 12/27/11  Yes Monika Salk, MD  amLODipine (NORVASC) 10 MG tablet Take 1 tablet (10 mg total) by mouth daily. 12/27/11 12/26/12  Monika Salk, MD  cloNIDine (CATAPRES) 0.2 MG tablet Take 1 tablet (0.2 mg total) by mouth 3 (three) times daily. 12/27/11 12/26/12  Monika Salk, MD  insulin glargine (LANTUS) 100 UNIT/ML injection Inject 15  Units into the skin 2 (two) times daily. 12/27/11 12/26/12  Monika Salk, MD  metoprolol tartrate (LOPRESSOR) 25 MG tablet Take 1 tablet (25 mg total) by mouth 2 (two) times daily. 12/27/11 12/26/12  Monika Salk, MD    Inpatient medications: . amLODipine  10 mg Oral Daily  . heparin  5,000 Units Subcutaneous 3 times per day  . insulin aspart  0-5 Units Subcutaneous QHS  . insulin aspart  0-9 Units Subcutaneous TID WC  . metoprolol tartrate  25 mg Oral BID  . sodium chloride  3 mL Intravenous Q12H    Discontinued Meds:   Medications Discontinued During This Encounter  Medication Reason  . metoprolol tartrate (LOPRESSOR) tablet 25 mg   . Multiple Vitamins-Minerals (MULTIVITAMINS THER. W/MINERALS) TABS Patient has not taken in last 30 days    Social History:  reports that he has never smoked. He does not have any smokeless tobacco history on file. He reports that he drinks alcohol. He reports that he does not use illicit drugs.  Family History:  History reviewed. No pertinent family history.  A comprehensive review of systems was negative except for: Constitutional: positive for fatigue and malaise Respiratory: positive for cough, dyspnea on exertion, sputum and wheezing Cardiovascular: positive for dyspnea Weight change:   Intake/Output Summary (Last 24 hours) at 12/21/13 1452  Last data filed at 12/21/13 1300  Gross per 24 hour  Intake    580 ml  Output    600 ml  Net    -20 ml   BP 157/112  Pulse 87  Temp(Src) 98.3 F (36.8 C) (Oral)  Resp 20  Ht 6\' 1"  (1.854 m)  Wt 136 kg (299 lb 13.2 oz)  BMI 39.57 kg/m2  SpO2 96% Filed Vitals:   12/20/13 2200 12/20/13 2305 12/21/13 0519 12/21/13 1438  BP: 133/42 133/78 172/99 157/112  Pulse: 129 119 100 87  Temp:  98.5 F (36.9 C) 98.3 F (36.8 C) 98.3 F (36.8 C)  TempSrc:  Oral Oral Oral  Resp: 27 28 20 20   Height:  6\' 1"  (1.854 m)    Weight:  136.5 kg (300 lb 14.9 oz) 136 kg (299 lb 13.2 oz)   SpO2: 96% 94% 100% 96%      General appearance: alert, cooperative, no distress and moderately obese Head: Normocephalic, without obvious abnormality, atraumatic Neck: no adenopathy, no carotid bruit, supple, symmetrical, trachea midline and thyroid not enlarged, symmetric, no tenderness/mass/nodules Resp: clear to auscultation bilaterally Cardio: regular rate and rhythm and no rub GI: soft, non-tender; bowel sounds normal; no masses,  no organomegaly Extremities: edema trace  Labs: Basic Metabolic Panel:  Recent Labs Lab 12/20/13 1800 12/21/13 0510  NA 144 139  K 4.8 5.6*  CL 105 102  CO2 22 19  GLUCOSE 126* 257*  BUN 34* 38*  CREATININE 3.63* 3.79*  ALBUMIN  --  3.2*  CALCIUM 10.1 9.6   Liver Function Tests:  Recent Labs Lab 12/21/13 0510  AST 17  ALT 16  ALKPHOS 60  BILITOT 0.4  PROT 8.6*  ALBUMIN 3.2*   No results found for this basename: LIPASE, AMYLASE,  in the last 168 hours No results found for this basename: AMMONIA,  in the last 168 hours CBC:  Recent Labs Lab 12/20/13 1800 12/21/13 0510  WBC 9.2 14.5*  NEUTROABS  --  13.4*  HGB 14.8 13.0  HCT 45.6 40.1  MCV 82.2 82.0  PLT 280 297   PT/INR: @LABRCNTIP (inr:5) Cardiac Enzymes: ) Recent Labs Lab 12/21/13 0017 12/21/13 0451 12/21/13 1109  TROPONINI <0.30 <0.30 <0.30   CBG:  Recent Labs Lab 12/20/13 2349 12/21/13 0738 12/21/13 1211  GLUCAP 261* 187* 198*    Iron Studies: No results found for this basename: IRON, TIBC, TRANSFERRIN, FERRITIN,  in the last 168 hours  Xrays/Other Studies: Dg Chest 2 View  12/20/2013   CLINICAL DATA:  Shortness of breath  EXAM: CHEST  2 VIEW  COMPARISON:  None.  FINDINGS: The lungs are clear and negative for focal airspace consolidation, pulmonary edema or suspicious pulmonary nodule. No pleural effusion or pneumothorax. Cardiac and mediastinal contours are within normal limits. No acute fracture or lytic or blastic osseous lesions. The visualized upper abdominal bowel gas pattern  is unremarkable.  IMPRESSION: No active cardiopulmonary disease.   Electronically Signed   By: Jacqulynn Cadet M.D.   On: 12/20/2013 19:02   US Renal  12/21/2013   CLINICAL DATA:  Acute on chronic kidney disease. Prior medical renal disease.  EXAM: RENAL/URINARY TRACT ULTRASOUND COMPLETE  COMPARISON:  US RENAL dated 12/25/2011  FINDINGS: Right Kidney:  Length: 10 cm. Increased parenchymal echotexture diffusely consistent with chronic medical renal disease. No hydronephrosis.  Left Kidney:  Length: 11.4 cm. Increased parenchymal echotexture diffusely consistent with chronic medical renal disease. No hydronephrosis.  Bladder:  No wall thickening or filling defect.  Bilateral urine flow jets are identified.  No significant changes since prior study.  IMPRESSION: Increase renal parenchymal echotexture bilaterally consistent with chronic medical renal disease. No hydronephrosis.   Electronically Signed   By: Lucienne Capers M.D.   On: 12/21/2013 00:46     Assessment/Plan: 1.  AKI/CKD- due to malignant hypertension and poorly controlled DM.  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  We discussed the ways to delay the progression of CKD: 1. BP control with goal <130/80 2. DM control with goal HbgA1c <7% 3. Use of an ACE/ARB 4. Avoidance of NSAIDs/COX-II I's 2. HTN- resume norvasc and metoprolol but would also add lasix due to hyperkalemia 3. DM- per primary svc 4. CKD stage 3-4:  Will educate about CKD and RRT 5. Hyperkalemia- as above, treat hyperglycemia and start lasix.  Educate on low K diet 6. Metabolic acidosis- follow after lasxi 7. SHPTH- will check iPTH 8. Obesity- stressed diet and weight loss. He would greatly benefit from a nutrition consult.   Governor Rooks Stamatia Masri 12/21/2013, 2:52 PM

## 2013-12-21 NOTE — Progress Notes (Signed)
On room air patient has Sat O2=96%

## 2013-12-21 NOTE — Progress Notes (Signed)
UR completed 

## 2013-12-21 NOTE — Progress Notes (Signed)
Inpatient Diabetes Program Recommendations  AACE/ADA: New Consensus Statement on Inpatient Glycemic Control (2013)  Target Ranges:  Prepandial:   less than 140 mg/dL      Peak postprandial:   less than 180 mg/dL (1-2 hours)      Critically ill patients:  140 - 180 mg/dL     Results for Patrick Ibarra, Patrick Ibarra (MRN [id number]) as of 12/21/2013 08:01  Ref. Range 12/21/2013 05:10  Glucose Latest Range: 70-99 mg/dL 257 (H)    Admitted with SOB, HTN urgency, acute on chronic kidney failure.  Has history of Type 2 DM, HTN.    Home DM Meds: Lantus 15 units bid + Novolog SSI   Note MD started Novolog Sensitive SSI tid ac + HS last night at midnight.  AM lab glucose today 257 mg/dl.  A1c ordered.   MD- Please consider adding at least 1/2 patient's home Lantus dose- Lantus 8 units bid (home dose is 15 units bid)   Will follow Wyn Quaker RN, MSN, CDE Diabetes Coordinator Inpatient Diabetes Program Team Pager: [phone] (8a-10p)

## 2013-12-22 LAB — GLUCOSE, CAPILLARY
Glucose-Capillary: 171 mg/dL — ABNORMAL HIGH (ref 70–99)
Glucose-Capillary: 128 mg/dL — ABNORMAL HIGH (ref 70–99)
Glucose-Capillary: 130 mg/dL — ABNORMAL HIGH (ref 70–99)
Glucose-Capillary: 124 mg/dL — ABNORMAL HIGH (ref 70–99)

## 2013-12-22 LAB — RENAL FUNCTION PANEL
Albumin: 3.3 g/dL — ABNORMAL LOW (ref 3.5–5.2)
BUN: 45 mg/dL — ABNORMAL HIGH (ref 6–23)
CALCIUM: 10.2 mg/dL (ref 8.4–10.5)
CO2: 20 mEq/L (ref 19–32)
Chloride: 101 mEq/L (ref 96–112)
Creatinine, Ser: 3.8 mg/dL — ABNORMAL HIGH (ref 0.50–1.35)
GFR calc Af Amer: 23 mL/min — ABNORMAL LOW (ref >90)
GFR, EST NON AFRICAN AMERICAN: 20 mL/min — AB (ref >90)
GLUCOSE: 144 mg/dL — AB (ref 70–99)
PHOSPHORUS: 3.7 mg/dL (ref 2.3–4.6)
Potassium: 4.8 mEq/L (ref 3.7–5.3)
Sodium: 139 mEq/L (ref 137–147)

## 2013-12-22 LAB — PARATHYROID HORMONE, INTACT (NO CA): PTH: 132.2 pg/mL — ABNORMAL HIGH (ref 14.0–72.0)

## 2013-12-22 LAB — VITAMIN D 25 HYDROXY (VIT D DEFICIENCY, FRACTURES): VIT D 25 HYDROXY: 20 ng/mL — AB (ref 30–89)

## 2013-12-22 MED ORDER — CLONIDINE HCL 0.1 MG PO TABS
0.1000 mg | ORAL_TABLET | Freq: Once | ORAL | Status: AC
  Administered 2013-12-22: 0.1 mg via ORAL
  Filled 2013-12-22 (×2): qty 1

## 2013-12-22 MED ORDER — HYDRALAZINE HCL 20 MG/ML IJ SOLN
10.0000 mg | Freq: Once | INTRAMUSCULAR | Status: AC
  Administered 2013-12-22: 10 mg via INTRAVENOUS
  Filled 2013-12-22: qty 1

## 2013-12-22 MED ORDER — METOPROLOL TARTRATE 50 MG PO TABS
50.0000 mg | ORAL_TABLET | Freq: Two times a day (BID) | ORAL | Status: DC
  Administered 2013-12-22 – 2013-12-24 (×4): 50 mg via ORAL
  Filled 2013-12-22 (×5): qty 1

## 2013-12-22 MED ORDER — INSULIN GLARGINE 100 UNIT/ML ~~LOC~~ SOLN
10.0000 [IU] | Freq: Every day | SUBCUTANEOUS | Status: DC
  Administered 2013-12-22: 10 [IU] via SUBCUTANEOUS
  Filled 2013-12-22 (×2): qty 0.1

## 2013-12-22 NOTE — Progress Notes (Signed)
Patient ID: Patrick Ibarra, male   DOB: 09/18/1980, 33 y.o.   MRN: [id number] S:no complaints O:BP 170/110  Pulse 84  Temp(Src) 98.1 F (36.7 C) (Oral)  Resp 18  Ht 6\' 1"  (1.854 m)  Wt 134.9 kg (297 lb 6.4 oz)  BMI 39.25 kg/m2  SpO2 94%  Intake/Output Summary (Last 24 hours) at 12/22/13 1404 Last data filed at 12/22/13 1100  Gross per 24 hour  Intake   1414 ml  Output   4021 ml  Net  -2607 ml   Intake/Output: I/O last 3 completed shifts: In: 920 [P.O.:920] Out: [id number] [Urine:3720; Stool:1]  Intake/Output this shift:  Total I/O In: 1074 [P.O.:1074] Out: 1200 [Urine:1200] Weight change: -7.983 kg (-17 lb 9.6 oz) Gen:WD WN obese AAM in NAd CVS:no rub Resp:cta [id number] Ext:no edema   Recent Labs Lab 12/20/13 1800 12/21/13 0510 12/22/13 0302  NA 144 139 139  K 4.8 5.6* 4.8  CL 105 102 101  CO2 22 19 20   GLUCOSE 126* 257* 144*  BUN 34* 38* 45*  CREATININE 3.63* 3.79* 3.80*  ALBUMIN  --  3.2* 3.3*  CALCIUM 10.1 9.6 10.2  PHOS  --   --  3.7  AST  --  17  --   ALT  --  16  --    Liver Function Tests:  Recent Labs Lab 12/21/13 0510 12/22/13 0302  AST 17  --   ALT 16  --   ALKPHOS 60  --   BILITOT 0.4  --   PROT 8.6*  --   ALBUMIN 3.2* 3.3*   No results found for this basename: LIPASE, AMYLASE,  in the last 168 hours No results found for this basename: AMMONIA,  in the last 168 hours CBC:  Recent Labs Lab 12/20/13 1800 12/21/13 0510  WBC 9.2 14.5*  NEUTROABS  --  13.4*  HGB 14.8 13.0  HCT 45.6 40.1  MCV 82.2 82.0  PLT 280 297   Cardiac Enzymes:  Recent Labs Lab 12/21/13 0017 12/21/13 0451 12/21/13 1109  TROPONINI <0.30 <0.30 <0.30   CBG:  Recent Labs Lab 12/21/13 1211 12/21/13 1738 12/21/13 2155 12/22/13 0751 12/22/13 1243  GLUCAP 198* 178* 155* 171* 128*    Iron Studies: No results found for this basename: IRON, TIBC, TRANSFERRIN, FERRITIN,  in the last 72 hours Studies/Results: Dg Chest 2 View  12/20/2013   CLINICAL DATA:   Shortness of breath  EXAM: CHEST  2 VIEW  COMPARISON:  None.  FINDINGS: The lungs are clear and negative for focal airspace consolidation, pulmonary edema or suspicious pulmonary nodule. No pleural effusion or pneumothorax. Cardiac and mediastinal contours are within normal limits. No acute fracture or lytic or blastic osseous lesions. The visualized upper abdominal bowel gas pattern is unremarkable.  IMPRESSION: No active cardiopulmonary disease.   Electronically Signed   By: Jacqulynn Cadet M.D.   On: 12/20/2013 19:02   US Renal  12/21/2013   CLINICAL DATA:  Acute on chronic kidney disease. Prior medical renal disease.  EXAM: RENAL/URINARY TRACT ULTRASOUND COMPLETE  COMPARISON:  US RENAL dated 12/25/2011  FINDINGS: Right Kidney:  Length: 10 cm. Increased parenchymal echotexture diffusely consistent with chronic medical renal disease. No hydronephrosis.  Left Kidney:  Length: 11.4 cm. Increased parenchymal echotexture diffusely consistent with chronic medical renal disease. No hydronephrosis.  Bladder:  No wall thickening or filling defect. Bilateral urine flow jets are identified.  No significant changes since prior study.  IMPRESSION: Increase renal parenchymal echotexture bilaterally consistent with chronic  medical renal disease. No hydronephrosis.   Electronically Signed   By: Lucienne Capers M.D.   On: 12/21/2013 00:46   . amLODipine  10 mg Oral Daily  . furosemide  20 mg Oral Daily  . heparin  5,000 Units Subcutaneous 3 times per day  . insulin aspart  0-5 Units Subcutaneous QHS  . insulin aspart  0-9 Units Subcutaneous TID WC  . metoprolol tartrate  25 mg Oral BID  . sodium chloride  3 mL Intravenous Q12H    BMET    Component Value Date/Time   NA 139 12/22/2013 0302   K 4.8 12/22/2013 0302   CL 101 12/22/2013 0302   CO2 20 12/22/2013 0302   GLUCOSE 144* 12/22/2013 0302   BUN 45* 12/22/2013 0302   CREATININE 3.80* 12/22/2013 0302   CALCIUM 10.2 12/22/2013 0302   GFRNONAA 20* 12/22/2013 0302    GFRAA 23* 12/22/2013 0302   CBC    Component Value Date/Time   WBC 14.5* 12/21/2013 0510   RBC 4.89 12/21/2013 0510   HGB 13.0 12/21/2013 0510   HCT 40.1 12/21/2013 0510   PLT 297 12/21/2013 0510   MCV 82.0 12/21/2013 0510   MCH 26.6 12/21/2013 0510   MCHC 32.4 12/21/2013 0510   RDW 13.9 12/21/2013 0510   LYMPHSABS 1.0 12/21/2013 0510   MONOABS 0.1 12/21/2013 0510   EOSABS 0.0 12/21/2013 0510   BASOSABS 0.0 12/21/2013 0510     Assessment/Plan:  1. AKI/CKD- due to malignant hypertension and poorly controlled DM. 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 We discussed the ways to delay the progression of CKD:  1. BP control with goal <130/80 2. DM control with goal HbgA1c <7% 3. Use of an ACE/ARB 4. Avoidance of NSAIDs/COX-II I's 2. Urgent HTN- resumed norvasc 10mg  daily, metoprolol 25mg  bid, and added lasix 20mg  qd.   1. Will increase metoprolol to 50 bid as his HR is in the 80's 2. Recommend sleep study to r/o sleep apnea (high pre-test probability) hopefully can be done as an inpt 3. May also benefit from ACE (would use captopril which is short acting and can be titrated quickly or stopped if he develops worsening kidney disease) 3. DM- per primary svc 4. CKD stage 3-4: Will educate about CKD and RRT 5. Hyperkalemia- as above, treat hyperglycemia and start lasix. Educate on low K diet 6. Metabolic acidosis- follow after lasxi 7. SHPTH-  iPTH 132, low hydroxy- vit D.  Start calcitriol and follow. 8. Obesity- stressed diet and weight loss. He would greatly benefit from a nutrition consult. 9. Dispo- d/c to home when BP stabilized.  Consider sleep study while in the hospital   Donetta Potts

## 2013-12-22 NOTE — Progress Notes (Signed)
[facility] PROGRESS NOTE   Patrick Ibarra [id number] DOB: [birth year] DOA: 12/20/2013 PCP: Default, Provider, MD  HPI 33 y.o. M, PMH: HTN and DM. In the ED pt c/o shortness of breath ongoing since last one week, primarily on exertion. He also describes orthopnea and PND since last 2 days. He has at his baseline apnea when he is breathing in his sleep. He denied any weight gain or leg swelling or abdominal distention. He denies any fever, cough, chills, chest pain. Denies any nausea or vomiting or abdominal discomfort. No diarrhea or burning urination.  He was diagnosed with hypertension and diabetes in the past and was given medications for both. He has stopped medications for diabetes and hypertensive almost a year ago.    Subjective: Continues to be stable and comfortable without complaints.  Assessment/Plan: Principal Problem:   Hypertensive emergency Active Problems:   Diabetes mellitus   Acute-on-chronic kidney injury   Wheezing      Hypertensive emergency    Elevated BP w/ ST when evaluated in the ED, pt has been noncompliant with medication regimen    Serial troponins are normal, ECG normal    BP normalized with Catapres x1 and hydralazine x1, use PRN for elevated BP, caution due to withdrawal symptoms & noncompliance    Resuming Norvasc and increase Metoprolol for further treatment, consider adding ACEI.             Acute-on-chronic kidney injury    Possibly due to hypertensive emergency and worsening of his CKD, sCr 3.79 (1.92 12/27/11), +Proteinuria.    Renal U/S: no evidence of obstruction, findings consistent with chronic renal dz    Reinstitute HTN meds and stabilize, goal BP<130/80    Nephrology consulted who will follow the patient.    Wheezing    Wheezing and DOE getting worse x 1 week    CXR-no cardiopulmonary processes, edema or infiltrates    Clear breath sounds and no SOB with activity post albuterol neb and solu-medrol    Begin to wean off the  oxygen    Diabetes mellitus    Glucose checks >100 mg/dL on multiple checks since 12/20/13    HgA1c 15.8-16.6 12/24/11, currently 7.5    Questionable medication compliance x 1 yr    Begin Lantus and Aspart           Hyperkalemia     K 5.6 on 12/21/13     Tx w/ IV Lasix per Nephrology     Resolved, K 4.8 on 12/22/13, monitor with BMP  DVT Prophylaxis: Heparin SQ Code Status: FULL Family Communication: Plan discussed with the patient and family at bedside. Disposition Plan: D/c home tomorrow  Consultants:  Nephrology  Objective: Filed Vitals:   12/22/13 1135  BP: 170/110  Pulse:   Temp:   Resp:     Intake/Output Summary (Last 24 hours) at 12/22/13 1149 Last data filed at 12/22/13 1100  Gross per 24 hour  Intake   1636 ml  Output   4321 ml  Net  -2685 ml   Filed Weights   12/20/13 2305 12/21/13 0519 12/22/13 0528  Weight: 136.5 kg (300 lb 14.9 oz) 136 kg (299 lb 13.2 oz) 134.9 kg (297 lb 6.4 oz)    Exam: General: Alert and awake, oriented x3, not in any acute distress. HEENT: anicteric sclera, pupils reactive to light and accommodation, EOMI, moist mucus membrances CVS: S1-S2 clear, no murmur rubs or gallops Chest: clear to auscultation bilaterally, no wheezing, rales or rhonchi Abdomen: soft  nontender, nondistended, normal bowel sounds, no organomegaly Extremities: no cyanosis, clubbing or edema noted bilaterally Neuro: Cranial nerves II-XII intact, no focal neurological deficits  Data Reviewed: Basic Metabolic Panel:  Recent Labs Lab 12/20/13 1800 12/21/13 0510 12/22/13 0302  NA 144 139 139  K 4.8 5.6* 4.8  CL 105 102 101  CO2 22 19 20   GLUCOSE 126* 257* 144*  BUN 34* 38* 45*  CREATININE 3.63* 3.79* 3.80*  CALCIUM 10.1 9.6 10.2  PHOS  --   --  3.7   Liver Function Tests:  Recent Labs Lab 12/21/13 0510 12/22/13 0302  AST 17  --   ALT 16  --   ALKPHOS 60  --   BILITOT 0.4  --   PROT 8.6*  --   ALBUMIN 3.2* 3.3*   CBC:  Recent Labs Lab  12/20/13 1800 12/21/13 0510  WBC 9.2 14.5*  NEUTROABS  --  13.4*  HGB 14.8 13.0  HCT 45.6 40.1  MCV 82.2 82.0  PLT 280 297   Cardiac Enzymes:  Recent Labs Lab 12/21/13 0017 12/21/13 0451 12/21/13 1109  TROPONINI <0.30 <0.30 <0.30   BNP (last 3 results)  Recent Labs  12/20/13 1800  PROBNP 566.2*   CBG:  Recent Labs Lab 12/21/13 0738 12/21/13 1211 12/21/13 1738 12/21/13 2155 12/22/13 0751  GLUCAP 187* 198* 178* 155* 171*    Studies: Dg Chest 2 View  12/20/2013   CLINICAL DATA:  Shortness of breath  EXAM: CHEST  2 VIEW  COMPARISON:  None.  FINDINGS: The lungs are clear and negative for focal airspace consolidation, pulmonary edema or suspicious pulmonary nodule. No pleural effusion or pneumothorax. Cardiac and mediastinal contours are within normal limits. No acute fracture or lytic or blastic osseous lesions. The visualized upper abdominal bowel gas pattern is unremarkable.  IMPRESSION: No active cardiopulmonary disease.   Electronically Signed   By: Jacqulynn Cadet M.D.   On: 12/20/2013 19:02   US Renal  12/21/2013   CLINICAL DATA:  Acute on chronic kidney disease. Prior medical renal disease.  EXAM: RENAL/URINARY TRACT ULTRASOUND COMPLETE  COMPARISON:  US RENAL dated 12/25/2011  FINDINGS: Right Kidney:  Length: 10 cm. Increased parenchymal echotexture diffusely consistent with chronic medical renal disease. No hydronephrosis.  Left Kidney:  Length: 11.4 cm. Increased parenchymal echotexture diffusely consistent with chronic medical renal disease. No hydronephrosis.  Bladder:  No wall thickening or filling defect. Bilateral urine flow jets are identified.  No significant changes since prior study.  IMPRESSION: Increase renal parenchymal echotexture bilaterally consistent with chronic medical renal disease. No hydronephrosis.   Electronically Signed   By: Lucienne Capers M.D.   On: 12/21/2013 00:46    Scheduled Meds: . amLODipine  10 mg Oral Daily  . furosemide  20 mg  Oral Daily  . heparin  5,000 Units Subcutaneous 3 times per day  . insulin aspart  0-5 Units Subcutaneous QHS  . insulin aspart  0-9 Units Subcutaneous TID WC  . metoprolol tartrate  25 mg Oral BID  . sodium chloride  3 mL Intravenous Q12H    Time spent: 35 minutes    Myrla Halsted PA-S   [facility] Pager [phone] If 7PM-7AM, please contact night-coverage at [email or web address], password [facility] 12/22/2013, 11:49 AM  LOS: 2 days       Addendum  Patient seen and examined, chart and data base reviewed.  I agree with the above assessment and plan.  For full details please see Mrs. Myrla Halsted PA-S note.  I reviewed and amended the above note as needed.   Birdie Hopes, MD [facility] Pager: [phone] 12/22/2013, 2:06 PM

## 2013-12-23 LAB — CBC
HCT: 44.1 % (ref 39.0–52.0)
Hemoglobin: 14.8 g/dL (ref 13.0–17.0)
MCH: 27.3 pg (ref 26.0–34.0)
MCHC: 33.6 g/dL (ref 30.0–36.0)
MCV: 81.2 fL (ref 78.0–100.0)
PLATELETS: 325 10*3/uL (ref 150–400)
RBC: 5.43 MIL/uL (ref 4.22–5.81)
RDW: 14.4 % (ref 11.5–15.5)
WBC: 11.7 10*3/uL — ABNORMAL HIGH (ref 4.0–10.5)

## 2013-12-23 LAB — BASIC METABOLIC PANEL
BUN: 49 mg/dL — AB (ref 6–23)
CALCIUM: 10.1 mg/dL (ref 8.4–10.5)
CO2: 20 mEq/L (ref 19–32)
Chloride: 97 mEq/L (ref 96–112)
Creatinine, Ser: 3.5 mg/dL — ABNORMAL HIGH (ref 0.50–1.35)
GFR calc Af Amer: 25 mL/min — ABNORMAL LOW (ref >90)
GFR calc non Af Amer: 22 mL/min — ABNORMAL LOW (ref >90)
GLUCOSE: 140 mg/dL — AB (ref 70–99)
Potassium: 4.3 mEq/L (ref 3.7–5.3)
Sodium: 136 mEq/L — ABNORMAL LOW (ref 137–147)

## 2013-12-23 LAB — GLUCOSE, CAPILLARY
GLUCOSE-CAPILLARY: 122 mg/dL — AB (ref 70–99)
Glucose-Capillary: 149 mg/dL — ABNORMAL HIGH (ref 70–99)
Glucose-Capillary: 142 mg/dL — ABNORMAL HIGH (ref 70–99)
Glucose-Capillary: 153 mg/dL — ABNORMAL HIGH (ref 70–99)

## 2013-12-23 MED ORDER — OXYCODONE-ACETAMINOPHEN 5-325 MG PO TABS: 1.0000 | ORAL_TABLET | Freq: Once | ORAL | Status: DC

## 2013-12-23 MED ORDER — CAPTOPRIL 25 MG PO TABS
25.0000 mg | ORAL_TABLET | Freq: Three times a day (TID) | ORAL | Status: DC
  Administered 2013-12-23 – 2013-12-24 (×4): 25 mg via ORAL
  Filled 2013-12-23 (×6): qty 1

## 2013-12-23 MED ORDER — HYDRALAZINE HCL 25 MG PO TABS
25.0000 mg | ORAL_TABLET | Freq: Three times a day (TID) | ORAL | Status: DC
  Administered 2013-12-23 – 2013-12-24 (×5): 25 mg via ORAL
  Filled 2013-12-23 (×7): qty 1

## 2013-12-23 MED ORDER — HYDRALAZINE HCL 20 MG/ML IJ SOLN
10.0000 mg | Freq: Once | INTRAMUSCULAR | Status: DC
  Filled 2013-12-23: qty 1

## 2013-12-23 MED ORDER — INSULIN GLARGINE 100 UNIT/ML ~~LOC~~ SOLN
15.0000 [IU] | Freq: Every day | SUBCUTANEOUS | Status: DC
  Administered 2013-12-23: 15 [IU] via SUBCUTANEOUS
  Filled 2013-12-23 (×2): qty 0.15

## 2013-12-23 MED ORDER — HYDRALAZINE HCL 20 MG/ML IJ SOLN
10.0000 mg | Freq: Once | INTRAMUSCULAR | Status: AC
  Administered 2013-12-23: 10 mg via INTRAVENOUS
  Filled 2013-12-23: qty 1

## 2013-12-23 NOTE — ED Provider Notes (Signed)
Medical screening examination/treatment/procedure(s) were conducted as a shared visit with non-physician practitioner(s) and myself.  I personally evaluated the patient during the encounter.   EKG Interpretation   Date/Time:  Monday December 20 2013 17:41:56 EDT Ventricular Rate:  112 PR Interval:  148 QRS Duration: 74 QT Interval:  324 QTC Calculation: 442 R Axis:   53 Text Interpretation:  Sinus tachycardia Septal infarct , age undetermined  Abnormal ECG Since last tracing rate faster Confirmed by YAO  MD, DAVID  ([id number]) on 12/20/2013 9:26:27 PM      Patrick Ibarra is a 33 y.o. male hx of renal insufficiency, HTN uncompliant with meds here with SOB. SOb since yesterday. Also generally not feeling well. No headache. Noted to be wheezing in triage and hypoxic to 90% and hypertensive. Wheezing on my exam, given nebs, steroids. Hypertensive but nonfocal neuro exam. No abdominal bruit. Cr elevated, concerned for possible hypertensive urgency. Given PO meds with minimal change, given lopressor and hydralazine and BP dec appropriately. D-dimer nl. Will admit for hypertensive urgency, asthma, renal failure.    Wandra Arthurs, MD 12/23/13 1059

## 2013-12-23 NOTE — Progress Notes (Signed)
[facility] PROGRESS NOTE   Patrick Ibarra [id number] DOB: June 23, 1981 DOA: 12/20/2013 PCP: Default, Provider, MD  HPI 33 y.o. M, PMH: HTN and DM. In the ED pt c/o shortness of breath ongoing since last one week, primarily on exertion. He also describes orthopnea and PND since last 2 days. He has at his baseline apnea when he is breathing in his sleep. He denied any weight gain or leg swelling or abdominal distention. He denies any fever, cough, chills, chest pain. Denies any nausea or vomiting or abdominal discomfort. No diarrhea or burning urination.  He was diagnosed with hypertension and diabetes in the past and was given medications for both. He has stopped medications for diabetes and hypertensive almost a year ago.    Subjective: Continues to be stable and comfortable without complaints. Wife at bedside.  Assessment/Plan: Principal Problem:   Hypertensive emergency Active Problems:   Diabetes mellitus   Acute-on-chronic kidney injury   Wheezing    Hypertensive emergency -Elevated BP w/ ST when evaluated in the ED, pt has been noncompliant with medication regimen -Serial troponins are normal, ECG normal -Use but when necessary IV hydralazine -Currently patient is on metoprolol, Norvasc, added hydralazine and captopril. -Continue to follow renal function, check BMP in a.m.            Acute-on-chronic kidney injury -Possibly due to hypertensive emergency and worsening of his CKD, sCr 3.79 (1.92 12/27/11), +Proteinuria. -Renal U/S: no evidence of obstruction, findings consistent with chronic renal dz -Reinstitute HTN meds and stabilize, goal BP<130/80 -Nephrology consulted who will follow the patient.    Wheezing -Wheezing and DOE getting worse x 1 week -CXR-no cardiopulmonary processes, edema or infiltrates -Clear breath sounds and no SOB with activity post albuterol neb and solu-medrol -Begin to wean off the oxygen    Diabetes mellitus -Glucose checks >100  mg/dL on multiple checks since 12/20/13 -HgA1c 15.8-16.6 12/24/11, currently 7.5 -Questionable medication compliance x 1 yr -Begin Lantus and Aspart           Hyperkalemia -K 5.6 on 12/21/13 -Tx w/ IV Lasix per Nephrology -Resolved, K 4.8 on 12/22/13, monitor with BMP  DVT Prophylaxis: Heparin SQ Code Status: FULL Family Communication: Plan discussed with the patient and family at bedside. Disposition Plan: D/c home tomorrow  Consultants:  Nephrology  Objective: Filed Vitals:   12/23/13 1041  BP: 160/102  Pulse: 80  Temp: 98 F (36.7 C)  Resp: 18    Intake/Output Summary (Last 24 hours) at 12/23/13 1230 Last data filed at 12/23/13 1103  Gross per 24 hour  Intake   1445 ml  Output   2800 ml  Net  -1355 ml   Filed Weights   12/21/13 0519 12/22/13 0528 12/23/13 0506  Weight: 136 kg (299 lb 13.2 oz) 134.9 kg (297 lb 6.4 oz) 133.085 kg (293 lb 6.4 oz)    Exam: General: Alert and awake, oriented x3, not in any acute distress. HEENT: anicteric sclera, pupils reactive to light and accommodation, EOMI, moist mucus membrances CVS: S1-S2 clear, no murmur rubs or gallops Chest: clear to auscultation bilaterally, no wheezing, rales or rhonchi Abdomen: soft nontender, nondistended, normal bowel sounds, no organomegaly Extremities: no cyanosis, clubbing or edema noted bilaterally Neuro: Cranial nerves II-XII intact, no focal neurological deficits  Data Reviewed: Basic Metabolic Panel:  Recent Labs Lab 12/20/13 1800 12/21/13 0510 12/22/13 0302 12/23/13 0729  NA 144 139 139 136*  K 4.8 5.6* 4.8 4.3  CL 105 102 101 97  CO2 22  19 20 20   GLUCOSE 126* 257* 144* 140*  BUN 34* 38* 45* 49*  CREATININE 3.63* 3.79* 3.80* 3.50*  CALCIUM 10.1 9.6 10.2 10.1  PHOS  --   --  3.7  --    Liver Function Tests:  Recent Labs Lab 12/21/13 0510 12/22/13 0302  AST 17  --   ALT 16  --   ALKPHOS 60  --   BILITOT 0.4  --   PROT 8.6*  --   ALBUMIN 3.2* 3.3*   CBC:  Recent  Labs Lab 12/20/13 1800 12/21/13 0510 12/23/13 0729  WBC 9.2 14.5* 11.7*  NEUTROABS  --  13.4*  --   HGB 14.8 13.0 14.8  HCT 45.6 40.1 44.1  MCV 82.2 82.0 81.2  PLT 280 297 325   Cardiac Enzymes:  Recent Labs Lab 12/21/13 0017 12/21/13 0451 12/21/13 1109  TROPONINI <0.30 <0.30 <0.30   BNP (last 3 results)  Recent Labs  12/20/13 1800  PROBNP 566.2*   CBG:  Recent Labs Lab 12/22/13 0751 12/22/13 1243 12/22/13 1733 12/22/13 2212 12/23/13 0759  GLUCAP 171* 128* 130* 124* 149*    Studies: No results found.  Scheduled Meds: . amLODipine  10 mg Oral Daily  . captopril  25 mg Oral TID  . furosemide  20 mg Oral Daily  . heparin  5,000 Units Subcutaneous 3 times per day  . hydrALAZINE  10 mg Intravenous Once  . hydrALAZINE  25 mg Oral 3 times per day  . insulin aspart  0-5 Units Subcutaneous QHS  . insulin aspart  0-9 Units Subcutaneous TID WC  . insulin glargine  10 Units Subcutaneous QHS  . metoprolol tartrate  50 mg Oral BID  . oxyCODONE-acetaminophen  1 tablet Oral Once  . sodium chloride  3 mL Intravenous Q12H    Time spent: 35 minutes    [facility] Pager [phone] If 7PM-7AM, please contact night-coverage at [email or web address], password [facility] 12/23/2013, 12:30 PM  LOS: 3 days    Birdie Hopes, MD [facility] Pager: [phone] 12/23/2013, 12:30 PM

## 2013-12-23 NOTE — Progress Notes (Signed)
Patient ID: Patrick Ibarra, male   DOB: 1980/10/30, 33 y.o.   MRN: [id number] S:no c/o O:BP 160/102  Pulse 80  Temp(Src) 98 F (36.7 C) (Oral)  Resp 18  Ht 6\' 1"  (1.854 m)  Wt 133.085 kg (293 lb 6.4 oz)  BMI 38.72 kg/m2  SpO2 98%  Intake/Output Summary (Last 24 hours) at 12/23/13 1227 Last data filed at 12/23/13 1103  Gross per 24 hour  Intake   1445 ml  Output   2800 ml  Net  -1355 ml   Intake/Output: I/O last 3 completed shifts: In: 2616 [P.O.:2616] Out: 5620 [Urine:5620]  Intake/Output this shift:  Total I/O In: 243 [P.O.:240; I.V.:3] Out: 900 [Urine:900] Weight change: -1.815 kg (-4 lb) Gen:WD obese AAM in NAD CVS:no rub Resp:cta [id number] Ext:no edema   Recent Labs Lab 12/20/13 1800 12/21/13 0510 12/22/13 0302 12/23/13 0729  NA 144 139 139 136*  K 4.8 5.6* 4.8 4.3  CL 105 102 101 97  CO2 22 19 20 20   GLUCOSE 126* 257* 144* 140*  BUN 34* 38* 45* 49*  CREATININE 3.63* 3.79* 3.80* 3.50*  ALBUMIN  --  3.2* 3.3*  --   CALCIUM 10.1 9.6 10.2 10.1  PHOS  --   --  3.7  --   AST  --  17  --   --   ALT  --  16  --   --    Liver Function Tests:  Recent Labs Lab 12/21/13 0510 12/22/13 0302  AST 17  --   ALT 16  --   ALKPHOS 60  --   BILITOT 0.4  --   PROT 8.6*  --   ALBUMIN 3.2* 3.3*   No results found for this basename: LIPASE, AMYLASE,  in the last 168 hours No results found for this basename: AMMONIA,  in the last 168 hours CBC:  Recent Labs Lab 12/20/13 1800 12/21/13 0510 12/23/13 0729  WBC 9.2 14.5* 11.7*  NEUTROABS  --  13.4*  --   HGB 14.8 13.0 14.8  HCT 45.6 40.1 44.1  MCV 82.2 82.0 81.2  PLT 280 297 325   Cardiac Enzymes:  Recent Labs Lab 12/21/13 0017 12/21/13 0451 12/21/13 1109  TROPONINI <0.30 <0.30 <0.30   CBG:  Recent Labs Lab 12/22/13 0751 12/22/13 1243 12/22/13 1733 12/22/13 2212 12/23/13 0759  GLUCAP 171* 128* 130* 124* 149*    Iron Studies: No results found for this basename: IRON, TIBC, TRANSFERRIN,  FERRITIN,  in the last 72 hours Studies/Results: No results found. Marland Kitchen amLODipine  10 mg Oral Daily  . captopril  25 mg Oral TID  . furosemide  20 mg Oral Daily  . heparin  5,000 Units Subcutaneous 3 times per day  . hydrALAZINE  10 mg Intravenous Once  . hydrALAZINE  25 mg Oral 3 times per day  . insulin aspart  0-5 Units Subcutaneous QHS  . insulin aspart  0-9 Units Subcutaneous TID WC  . insulin glargine  10 Units Subcutaneous QHS  . metoprolol tartrate  50 mg Oral BID  . oxyCODONE-acetaminophen  1 tablet Oral Once  . sodium chloride  3 mL Intravenous Q12H    BMET    Component Value Date/Time   NA 136* 12/23/2013 0729   K 4.3 12/23/2013 0729   CL 97 12/23/2013 0729   CO2 20 12/23/2013 0729   GLUCOSE 140* 12/23/2013 0729   BUN 49* 12/23/2013 0729   CREATININE 3.50* 12/23/2013 0729   CALCIUM 10.1 12/23/2013 0729  GFRNONAA 22* 12/23/2013 0729   GFRAA 25* 12/23/2013 0729   CBC    Component Value Date/Time   WBC 11.7* 12/23/2013 0729   RBC 5.43 12/23/2013 0729   HGB 14.8 12/23/2013 0729   HCT 44.1 12/23/2013 0729   PLT 325 12/23/2013 0729   MCV 81.2 12/23/2013 0729   MCH 27.3 12/23/2013 0729   MCHC 33.6 12/23/2013 0729   RDW 14.4 12/23/2013 0729   LYMPHSABS 1.0 12/21/2013 0510   MONOABS 0.1 12/21/2013 0510   EOSABS 0.0 12/21/2013 0510   BASOSABS 0.0 12/21/2013 0510     Assessment/Plan:  1. AKI/CKD- due to malignant hypertension and poorly controlled DM. 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 We discussed the ways to delay the progression of CKD:  1. BP control with goal <130/80 2. DM control with goal HbgA1c <7% 3. Use of an ACE/ARB 4. Avoidance of NSAIDs/COX-II I's 2. Urgent HTN- resumed norvasc 10mg  daily, metoprolol 50mg  bid, and added lasix 20mg  qd.  1. Started on captopril and will follow BP and K/Cr levels closely  2. Recommend sleep study to r/o sleep apnea (high pre-test probability) hopefully can be done as an inpt 3. DM- per primary svc 4. CKD stage 3-4: Will educate about CKD  and RRT 5. Hyperkalemia- as above, treat hyperglycemia and start lasix. Educate on low K diet 6. Metabolic acidosis- follow after lasxi 7. SHPTH- iPTH 132, low hydroxy- vit D. Start calcitriol and follow. 8. Obesity- stressed diet and weight loss. He would greatly benefit from a nutrition consult. 9. Dispo- d/c to home when BP stabilized. Consider sleep study while in the hospital or as an outpt, however insurance will be an issue. SW involved 10.   [place]

## 2013-12-23 NOTE — Care Management Note (Signed)
    Page 1 of 1   12/24/2013     4:42:52 PM CARE MANAGEMENT NOTE 12/24/2013  Patient:  Patrick Ibarra, Patrick Ibarra   Account Number:  [id number]  Date Initiated:  12/23/2013  Documentation initiated by:  Tomi Bamberger  Subjective/Objective Assessment:   dx acute on chronic renal failure  admit as observation- lives with spouse. pta indep.     Action/Plan:   Anticipated DC Date:  12/24/2013   Anticipated DC Plan:  [facility]  CM consult      Choice offered to / List presented to:             Status of service:  Completed, signed off Medicare Important Message given?   (If response is "NO", the following Medicare IM given date fields will be blank) Date Medicare IM given:   Date Additional Medicare IM given:    Discharge Disposition:  HOME/SELF CARE  Per UR Regulation:  Reviewed for med. necessity/level of care/duration of stay  If discussed at [place] of Stay Meetings, dates discussed:    Comments:  12/24/13 [place], BSN [phone] patient has f/u apt at [facility] with Dr. Verl Blalock.  12/23/12 [place], BSN [phone] patient lives with spouse, pta indep.  Patient states the financial counselor came up to see him.  Patient states he needs ast with medications , he is not working at this time.  NCM will ast with [facility] , he will need insulin , this was explained to patient and wife.  Also will set patient up with [facility].

## 2013-12-24 ENCOUNTER — Encounter (HOSPITAL_COMMUNITY): Payer: Self-pay | Admitting: Physician Assistant

## 2013-12-24 DIAGNOSIS — I5043 Acute on chronic combined systolic (congestive) and diastolic (congestive) heart failure: Secondary | ICD-10-CM

## 2013-12-24 DIAGNOSIS — I517 Cardiomegaly: Secondary | ICD-10-CM

## 2013-12-24 DIAGNOSIS — I119 Hypertensive heart disease without heart failure: Secondary | ICD-10-CM

## 2013-12-24 DIAGNOSIS — I509 Heart failure, unspecified: Secondary | ICD-10-CM

## 2013-12-24 LAB — RENAL FUNCTION PANEL
ALBUMIN: 3.3 g/dL — AB (ref 3.5–5.2)
BUN: 49 mg/dL — ABNORMAL HIGH (ref 6–23)
CHLORIDE: 97 meq/L (ref 96–112)
CO2: 21 mEq/L (ref 19–32)
Calcium: 9.8 mg/dL (ref 8.4–10.5)
Creatinine, Ser: 3.61 mg/dL — ABNORMAL HIGH (ref 0.50–1.35)
GFR calc Af Amer: 24 mL/min — ABNORMAL LOW (ref >90)
GFR, EST NON AFRICAN AMERICAN: 21 mL/min — AB (ref >90)
Glucose, Bld: 129 mg/dL — ABNORMAL HIGH (ref 70–99)
PHOSPHORUS: 4.3 mg/dL (ref 2.3–4.6)
POTASSIUM: 4.3 meq/L (ref 3.7–5.3)
SODIUM: 135 meq/L — AB (ref 137–147)

## 2013-12-24 LAB — GLUCOSE, CAPILLARY
Glucose-Capillary: 120 mg/dL — ABNORMAL HIGH (ref 70–99)
Glucose-Capillary: 119 mg/dL — ABNORMAL HIGH (ref 70–99)

## 2013-12-24 LAB — CBC
HCT: 43.7 % (ref 39.0–52.0)
Hemoglobin: 14.8 g/dL (ref 13.0–17.0)
MCH: 27.1 pg (ref 26.0–34.0)
MCHC: 33.9 g/dL (ref 30.0–36.0)
MCV: 80 fL (ref 78.0–100.0)
PLATELETS: 320 10*3/uL (ref 150–400)
RBC: 5.46 MIL/uL (ref 4.22–5.81)
RDW: 14.1 % (ref 11.5–15.5)
WBC: 10.6 10*3/uL — AB (ref 4.0–10.5)

## 2013-12-24 MED ORDER — METOPROLOL TARTRATE 50 MG PO TABS: 50.0000 mg | ORAL_TABLET | Freq: Two times a day (BID) | ORAL | Status: DC

## 2013-12-24 MED ORDER — AMLODIPINE BESYLATE 10 MG PO TABS: 10.0000 mg | ORAL_TABLET | Freq: Every day | ORAL | Status: DC

## 2013-12-24 MED ORDER — CARVEDILOL 6.25 MG PO TABS
6.2500 mg | ORAL_TABLET | Freq: Two times a day (BID) | ORAL | Status: DC
  Filled 2013-12-24 (×2): qty 1

## 2013-12-24 MED ORDER — INSULIN GLARGINE 100 UNIT/ML ~~LOC~~ SOLN: 15.0000 [IU] | Freq: Every day | SUBCUTANEOUS | Status: DC

## 2013-12-24 MED ORDER — ISOSORBIDE MONONITRATE 15 MG HALF TABLET: 15.0000 mg | ORAL_TABLET | Freq: Every day | ORAL | Status: DC

## 2013-12-24 MED ORDER — ISOSORBIDE MONONITRATE 15 MG HALF TABLET
15.0000 mg | ORAL_TABLET | Freq: Every day | ORAL | Status: DC
  Filled 2013-12-24: qty 1

## 2013-12-24 MED ORDER — FUROSEMIDE 20 MG PO TABS: 20.0000 mg | ORAL_TABLET | Freq: Every day | ORAL | Status: DC

## 2013-12-24 MED ORDER — CAPTOPRIL 25 MG PO TABS: 25.0000 mg | ORAL_TABLET | Freq: Three times a day (TID) | ORAL | Status: DC

## 2013-12-24 MED ORDER — CARVEDILOL 6.25 MG PO TABS: 6.2500 mg | ORAL_TABLET | Freq: Two times a day (BID) | ORAL | Status: DC

## 2013-12-24 MED ORDER — HYDRALAZINE HCL 25 MG PO TABS: 25.0000 mg | ORAL_TABLET | Freq: Three times a day (TID) | ORAL | Status: DC

## 2013-12-24 NOTE — Consult Note (Signed)
CARDIOLOGY CONSULT NOTE   Patient ID: Patrick Ibarra MRN: [id number] DOB/AGE: November 12, 1980 33 y.o.  Admit date: 12/20/2013  Primary Physician   Default, Provider, MD Primary Cardiologist   New Reason for Consultation   SOB  HPI: Patrick Ibarra is a 33 y.o. male with a history of morbid obesity, newly diagnosed HTN and DM. He has not taken any medications for these in approx 1 year. In the ED pt c/o shortness of breath ongoing since last one week, primarily on exertion. He also describes orthopnea and PND since last 2 days. He has at his baseline apnea when he is breathing in his sleep. He denied any weight gain or leg swelling or abdominal distention. He denies any fever, cough, chills, chest pain. Denies any nausea or vomiting or abdominal discomfort. No diarrhea or burning urination.     Body mass index is 39.16 kg/(m^2).   Past Medical History  Diagnosis Date  . Hypertension      History reviewed. No pertinent past surgical history.  No Known Allergies  I have reviewed the patient's current medications . amLODipine  10 mg Oral Daily  . captopril  25 mg Oral TID  . furosemide  20 mg Oral Daily  . heparin  5,000 Units Subcutaneous 3 times per day  . hydrALAZINE  10 mg Intravenous Once  . hydrALAZINE  25 mg Oral 3 times per day  . insulin aspart  0-5 Units Subcutaneous QHS  . insulin aspart  0-9 Units Subcutaneous TID WC  . insulin glargine  15 Units Subcutaneous QHS  . metoprolol tartrate  50 mg Oral BID  . oxyCODONE-acetaminophen  1 tablet Oral Once  . sodium chloride  3 mL Intravenous Q12H     acetaminophen, acetaminophen, ipratropium-albuterol, ondansetron (ZOFRAN) IV, ondansetron  Prior to Admission medications   Medication Sig Start Date End Date Taking? Authorizing Provider  insulin aspart (NOVOLOG) 100 UNIT/ML injection For CBG: 70-120, No Insulin; CBG: 121-150, 2 units; CBG 151-200, 3 units; CBG: 201-250, 5 units.; CBG 251-300, 8 units; CBG: 301-350, 11  units; CBG: 351-400, 15 units. 12/27/11  Yes Monika Salk, MD  amLODipine (NORVASC) 10 MG tablet Take 1 tablet (10 mg total) by mouth daily. 12/27/11 12/26/12  Monika Salk, MD  amLODipine (NORVASC) 10 MG tablet Take 1 tablet (10 mg total) by mouth daily. 12/24/13   Hosie Poisson, MD  captopril (CAPOTEN) 25 MG tablet Take 1 tablet (25 mg total) by mouth 3 (three) times daily. 12/24/13   Hosie Poisson, MD  cloNIDine (CATAPRES) 0.2 MG tablet Take 1 tablet (0.2 mg total) by mouth 3 (three) times daily. 12/27/11 12/26/12  Monika Salk, MD  furosemide (LASIX) 20 MG tablet Take 1 tablet (20 mg total) by mouth daily. 12/24/13   Hosie Poisson, MD  hydrALAZINE (APRESOLINE) 25 MG tablet Take 1 tablet (25 mg total) by mouth every 8 (eight) hours. 12/24/13   Hosie Poisson, MD  insulin glargine (LANTUS) 100 UNIT/ML injection Inject 15 Units into the skin 2 (two) times daily. 12/27/11 12/26/12  Monika Salk, MD  insulin glargine (LANTUS) 100 UNIT/ML injection Inject 0.15 mLs (15 Units total) into the skin at bedtime. 12/24/13   Hosie Poisson, MD  metoprolol (LOPRESSOR) 50 MG tablet Take 1 tablet (50 mg total) by mouth 2 (two) times daily. 12/24/13   Hosie Poisson, MD  metoprolol tartrate (LOPRESSOR) 25 MG tablet Take 1 tablet (25 mg total) by mouth 2 (two) times daily. 12/27/11 12/26/12  Monika Salk, MD     History   Social History  . Marital Status: Married    Spouse Name: N/A    Number of Children: N/A  . Years of Education: N/A   Occupational History  . Not on file.   Social History Main Topics  . Smoking status: Never Smoker   . Smokeless tobacco: Not on file  . Alcohol Use: Yes     Comment: WINE ONCE A WEEK  . Drug Use: No  . Sexual Activity: Not on file   Other Topics Concern  . Not on file   Social History Narrative  . No narrative on file    No family status information on file.   History reviewed. No pertinent family history.   ROS:  Full 14 point review of systems complete and found to be negative  unless listed above.  Physical Exam: Blood pressure 160/101, pulse 77, temperature 98.6 F (37 C), temperature source Oral, resp. rate 22, height 6\' 1"  (1.854 m), weight 296 lb 11.8 oz (134.6 kg), SpO2 98.00%.  General: Alert and awake, oriented x3, not in any acute distress. obese HEENT: anicteric sclera, pupils reactive to light and accommodation, EOMI, moist mucus membrances  CVS: S1-S2 clear, no murmur rubs or gallops  Chest: clear to auscultation bilaterally, no wheezing, rales or rhonchi . Decreased breath sounds Abdomen: soft nontender, nondistended, normal bowel sounds, no organomegaly  Extremities: no cyanosis, clubbing or edema noted bilaterally  Neuro: Cranial nerves II-XII intact, no focal neurological deficits   Labs:   Lab Results  Component Value Date   WBC 10.6* 12/24/2013   HGB 14.8 12/24/2013   HCT 43.7 12/24/2013   MCV 80.0 12/24/2013   PLT 320 12/24/2013     Recent Labs Lab 12/21/13 0510  12/24/13 0744  NA 139  < > 135*  K 5.6*  < > 4.3  CL 102  < > 97  CO2 19  < > 21  BUN 38*  < > 49*  CREATININE 3.79*  < > 3.61*  CALCIUM 9.6  < > 9.8  PROT 8.6*  --   --   BILITOT 0.4  --   --   ALKPHOS 60  --   --   ALT 16  --   --   AST 17  --   --   GLUCOSE 257*  < > 129*  ALBUMIN 3.2*  < > 3.3*  < > = values in this interval not displayed. No results found for this basename: mg   Pro B Natriuretic peptide (BNP)  Date/Time Value Ref Range Status  12/20/2013  6:00 PM 566.2* 0 - 125 pg/mL Final    Lab Results  Component Value Date   DDIMER 0.32 12/20/2013    TSH  Date/Time Value Ref Range Status  12/21/2013 12:17 AM 0.805  0.350 - 4.500 uIU/mL Final     Please note change in reference range.    Echo: 12/24/2013 LV EF: 45% - 50% History: PMH: SOB Congestive heart failure. Risk factors: Hypertension. Diabetes mellitus. ----------------------------------------------------------- Study Conclusions Left ventricle: The cavity size was normal. Wall  thickness was increased in a pattern of moderate LVH. Systolic function was mildly reduced. The estimated ejection fraction was in the range of 45% to 50%. Diffuse hypokinesis. Mild hypokinesis.    2D ECHO 12/25/2011 LV EF: 55% - 60% History: Risk factors: Hypertension. Diabetes mellitus. Study Conclusions Left ventricle: The cavity size was normal. Systolic function was normal. The estimated ejection fraction  was in the range of 55% to 60%. Wall motion was normal; there were no regional wall motion abnormalities.   ECG:  HR 112  Sinus tachycardia Septal infarct , age undetermined  Radiology:  No results found.  ASSESSMENT AND PLAN:    Principal Problem:   Hypertensive emergency Active Problems:   Diabetes mellitus   Acute-on-chronic kidney injury   Wheezing  Patrick Ibarra is a 33 y.o. male with a history of morbid obesity, newly diagnosed HTN and DM. He has not taken any medications for these in approx 1 year.  SOB- CXR with no CP disease, no signs of volume overload although he does have some sx of orthopnea and PND. Feels better, possibly due to Lasix or better controlled HTN.   Hypertensive emergency  -- Elevated BP, pt has been noncompliant with medication regimen  -- Serial troponins are normal, ECG normal, ECHO showed diffuse hypokinesis with LVEF of 45% to 50%, change from 2013 echocardiogram.  -- Continue IV hydralazine. Consider adding a nitrate with LV dysfunction in an african [place] -- Currently patient is on metoprolol, Norvasc, added hydralazine and captopril.  -- Will change lopressor to carvedilol as it has alpha blockade   Acute-on-chronic kidney injury  -- Possibly due to hypertensive emergency and worsening of his CKD, sCr 3.79 (1.92 12/27/11), +Proteinuria.  -- Renal U/S: no evidence of obstruction, findings consistent with chronic renal dz  -- Agree with goal BP<130/80  -- Nephrology consulted and following  Diabetes mellitus  -- HgA1c 15.8-16.6  12/24/11, currently 7.5  -- Medication non- compliance x 1 yr  -- Per IM  Hyperkalemia  -- K 5.6 on 12/21/13  -- Tx w/ IV Lasix per Nephrology  -- Resolved, K 4.3 on 12/24/13, monitor with BMP   Signed: Perry Mount, PA-C 12/24/2013 2:33 PM  Pager [phone]  Patient seen and examined and history reviewed. All available data reviewed. Agree with above findings and plan. 33 yo BM presents with new CHF and worsening renal failure related to uncontrolled DM and HTN. Also with obesity. Echo shows LV dysfunction with EF of 45% that is new compared to 2 yrs ago. Global hypokinesis. Patient with history of noncompliance. BP improved since admission. Now on captopril, ACEi, lopressor, amlodipine, and hydralazine. I would recommend switching lopressor to carvedilol for better BP control. If BP still high consider addition of long acting nitrate. Long term prognosis will depend on compliance with medications and lifestyle modification.  Ander Slade [facility] 12/24/2013 2:56 PM

## 2013-12-24 NOTE — Progress Notes (Signed)
Patient's b/p is still high tonight. Last b/p was 157/102. NP Schorr has been notify just in case further orders are needed. Will continue to monitor

## 2013-12-24 NOTE — Progress Notes (Signed)
[facility] PROGRESS NOTE   Patrick Ibarra [id number] DOB: [birth year] DOA: 12/20/2013 PCP: Default, Provider, MD  HPI 33 y.o. M, PMH: HTN and DM. In the ED pt c/o shortness of breath ongoing since last one week, primarily on exertion. He also describes orthopnea and PND since last 2 days. He has at his baseline apnea when he is breathing in his sleep. He denied any weight gain or leg swelling or abdominal distention. He denies any fever, cough, chills, chest pain. Denies any nausea or vomiting or abdominal discomfort. No diarrhea or burning urination.  He was diagnosed with hypertension and diabetes in the past and was given medications for both. He has stopped medications for diabetes and hypertensive almost a year ago.    Subjective: Continues to be stable and comfortable without complaints. Wife at bedside.  Assessment/Plan: Principal Problem:   Hypertensive emergency Active Problems:   Diabetes mellitus   Acute-on-chronic kidney injury   Wheezing    Hypertensive emergency -Elevated BP w/ ST when evaluated in the ED, pt has been noncompliant with medication regimen -Serial troponins are normal, ECG normal, ECHO showed diffuse hypokinesis with LVEF of 45% to 50%, change from 2013 echocardiogram. Cardiology consulted and pending.  -Use but when necessary IV hydralazine -Currently patient is on metoprolol, Norvasc, added hydralazine and captopril. -Continue to follow renal function, check BMP in a.m.            Acute-on-chronic kidney injury -Possibly due to hypertensive emergency and worsening of his CKD, sCr 3.79 (1.92 12/27/11), +Proteinuria. -Renal U/S: no evidence of obstruction, findings consistent with chronic renal dz -Reinstitute HTN meds and stabilize, goal BP<130/80 -Nephrology consulted who will follow the patient.    Wheezing -Wheezing and DOE getting worse x 1 week -CXR-no cardiopulmonary processes, edema or infiltrates -Clear breath sounds and no SOB  with activity post albuterol neb and solu-medrol -Begin to wean off the oxygen    Diabetes mellitus -Glucose checks >100 mg/dL on multiple checks since 12/20/13 -HgA1c 15.8-16.6 12/24/11, currently 7.5 -Questionable medication compliance x 1 yr -Begin Lantus and Aspart - CBG (last 3)   Recent Labs  12/23/13 2048 12/24/13 0750 12/24/13 1238  GLUCAP 153* 120* 119*               Hyperkalemia -K 5.6 on 12/21/13 -Tx w/ IV Lasix per Nephrology -Resolved, K 4.3 on 12/24/13, monitor with BMP  DVT Prophylaxis: Heparin SQ Code Status: FULL Family Communication: Plan discussed with the patient and family at bedside. Disposition Plan: D/c home tomorrow  Consultants:  Nephrology  Objective: Filed Vitals:   12/24/13 0759  BP: 137/87  Pulse: 77  Temp: 98.1 F (36.7 C)  Resp: 20    Intake/Output Summary (Last 24 hours) at 12/24/13 1346 Last data filed at 12/24/13 0959  Gross per 24 hour  Intake    780 ml  Output    500 ml  Net    280 ml   Filed Weights   12/22/13 0528 12/23/13 0506 12/24/13 0500  Weight: 134.9 kg (297 lb 6.4 oz) 133.085 kg (293 lb 6.4 oz) 134.6 kg (296 lb 11.8 oz)    Exam: General: Alert and awake, oriented x3, not in any acute distress. HEENT: anicteric sclera, pupils reactive to light and accommodation, EOMI, moist mucus membrances CVS: S1-S2 clear, no murmur rubs or gallops Chest: clear to auscultation bilaterally, no wheezing, rales or rhonchi Abdomen: soft nontender, nondistended, normal bowel sounds, no organomegaly Extremities: no cyanosis, clubbing or edema noted bilaterally Neuro:  Cranial nerves II-XII intact, no focal neurological deficits  Data Reviewed: Basic Metabolic Panel:  Recent Labs Lab 12/20/13 1800 12/21/13 0510 12/22/13 0302 12/23/13 0729 12/24/13 0744  NA 144 139 139 136* 135*  K 4.8 5.6* 4.8 4.3 4.3  CL 105 102 101 97 97  CO2 22 19 20 20 21   GLUCOSE 126* 257* 144* 140* 129*  BUN 34* 38* 45* 49* 49*  CREATININE 3.63*  3.79* 3.80* 3.50* 3.61*  CALCIUM 10.1 9.6 10.2 10.1 9.8  PHOS  --   --  3.7  --  4.3   Liver Function Tests:  Recent Labs Lab 12/21/13 0510 12/22/13 0302 12/24/13 0744  AST 17  --   --   ALT 16  --   --   ALKPHOS 60  --   --   BILITOT 0.4  --   --   PROT 8.6*  --   --   ALBUMIN 3.2* 3.3* 3.3*   CBC:  Recent Labs Lab 12/20/13 1800 12/21/13 0510 12/23/13 0729 12/24/13 0744  WBC 9.2 14.5* 11.7* 10.6*  NEUTROABS  --  13.4*  --   --   HGB 14.8 13.0 14.8 14.8  HCT 45.6 40.1 44.1 43.7  MCV 82.2 82.0 81.2 80.0  PLT 280 297 325 320   Cardiac Enzymes:  Recent Labs Lab 12/21/13 0017 12/21/13 0451 12/21/13 1109  TROPONINI <0.30 <0.30 <0.30   BNP (last 3 results)  Recent Labs  12/20/13 1800  PROBNP 566.2*   CBG:  Recent Labs Lab 12/23/13 1249 12/23/13 1718 12/23/13 2048 12/24/13 0750 12/24/13 1238  GLUCAP 122* 142* 153* 120* 119*    Studies: No results found.  Scheduled Meds: . amLODipine  10 mg Oral Daily  . captopril  25 mg Oral TID  . furosemide  20 mg Oral Daily  . heparin  5,000 Units Subcutaneous 3 times per day  . hydrALAZINE  10 mg Intravenous Once  . hydrALAZINE  25 mg Oral 3 times per day  . insulin aspart  0-5 Units Subcutaneous QHS  . insulin aspart  0-9 Units Subcutaneous TID WC  . insulin glargine  15 Units Subcutaneous QHS  . metoprolol tartrate  50 mg Oral BID  . oxyCODONE-acetaminophen  1 tablet Oral Once  . sodium chloride  3 mL Intravenous Q12H    Time spent: 35 minutes   Hosie Poisson, MD [facility] Pager [phone] If 7PM-7AM, please contact night-coverage at [email or web address], password [facility] 12/24/2013, 1:46 PM  LOS: 4 days

## 2013-12-24 NOTE — Progress Notes (Addendum)
12/24/13 Patient will be discharged when ordered,  2 D Echo done 4/24, physician has asked for cardiology consult.

## 2013-12-24 NOTE — Discharge Summary (Signed)
Physician Discharge Summary  Patrick Ibarra [id number] DOB: 01-Mar-1981 DOA: 12/20/2013  PCP: Default, Provider, MD  Admit date: 12/20/2013 Discharge date: 12/24/2013  Time spent:  minutes  Recommendations for Outpatient Follow-up:  Follow up withPCP in one week.  Follow up with cardiology as recommended Please obtain a sleep study as outpatient.  Follow up with cbc and bmp IN ONE week to check creatinine Check your blood everyday , note it down and take it to your PCP.   Discharge Diagnoses:  Principal Problem:   Hypertensive emergency Active Problems:   Diabetes mellitus   Acute-on-chronic kidney injury   Wheezing   Discharge Condition:  improved  Diet recommendation: low sodium diet.   Filed Weights   12/22/13 0528 12/23/13 0506 12/24/13 0500  Weight: 134.9 kg (297 lb 6.4 oz) 133.085 kg (293 lb 6.4 oz) 134.6 kg (296 lb 11.8 oz)    History of present illness:  33 y.o. M, PMH: HTN and DM. In the ED pt c/o shortness of breath ongoing since last one week, primarily on exertion. He also describes orthopnea and PND since last 2 days. He has at his baseline apnea when he is breathing in his sleep. He denied any weight gain or leg swelling or abdominal distention. He denies any fever, cough, chills, chest pain. Denies any nausea or vomiting or abdominal discomfort. No diarrhea or burning urination.  He was diagnosed with hypertension and diabetes in the past and was given medications for both. He has stopped medications for diabetes and hypertensive almost a year ago.   Hospital Course:   Hypertensive emergency  -Elevated BP w/ ST when evaluated in the ED, pt has been noncompliant with medication regimen  -Serial troponins are normal, ECG normal, ECHO showed diffuse hypokinesis with LVEF of 45% to 50%, change from 2013 echocardiogram. Cardiology consulted and changed metoprolol to coreg for better BP control.   -Currently patient is on coreg, Norvasc, added hydralazine and  captopril.  -Continue to follow renal function, check BMP in 1 week. .  Acute-on-chronic kidney injury  -Possibly due to hypertensive emergency and worsening of his CKD, sCr 3.79 (1.92 12/27/11), +Proteinuria.  -Renal U/S: no evidence of obstruction, findings consistent with chronic renal dz  -Reinstitute HTN meds and stabilize, goal BP<130/80  -Nephrology consulted who will follow the patient as outpatient.  Wheezing  -Wheezing and DOE getting worse x 1 week  -CXR-no cardiopulmonary processes, edema or infiltrates  -Clear breath sounds and no SOB with activity post albuterol neb and solu-medrol   Diabetes mellitus  -Glucose checks >100 mg/dL on multiple checks since 12/20/13  -HgA1c 15.8-16.6 12/24/11, currently 7.5  -Questionable medication compliance x 1 yr  -Begin Lantus and Aspart  -  CBG (last 3)   Recent Labs   12/23/13 2048  12/24/13 0750  12/24/13 1238   GLUCAP  153*  120*  119*    Hyperkalemia  -K 5.6 on 12/21/13  -Tx w/ IV Lasix per Nephrology  -Resolved, K 4.3 on 12/24/13, monitor with BMP as outpatient .    Procedures:  echo  Consultations:  Cardiology  Renal.  Discharge Exam: Filed Vitals:   12/24/13 1418  BP: 160/101  Pulse: 77  Temp: 98.6 F (37 C)  Resp: 22    General: alert afebrile comfortable Cardiovascular: s1s2 Respiratory: ctab  Discharge Instructions You were cared for by a hospitalist during your hospital stay. If you have any questions about your discharge medications or the care you received while you were in the  hospital after you are discharged, you can call the unit and asked to speak with the hospitalist on call if the hospitalist that took care of you is not available. Once you are discharged, your primary care physician will handle any further medical issues. Please note that NO REFILLS for any discharge medications will be authorized once you are discharged, as it is imperative that you return to your primary care physician (or  establish a relationship with a primary care physician if you do not have one) for your aftercare needs so that they can reassess your need for medications and monitor your lab values.  Discharge Orders   Future Appointments Provider Department Dept Phone   01/05/2014 2:00 PM Renella Cunas, [place] [phone]   01/06/2014 3:30 PM [facility] Counselor [place] [phone]   01/27/2014 8:00 PM [facility] [phone]   Future Orders Complete By Expires   Diet - low sodium heart healthy  As directed    Discharge instructions  As directed        Medication List    STOP taking these medications       cloNIDine 0.2 MG tablet  Commonly known as:  CATAPRES     metoprolol tartrate 25 MG tablet  Commonly known as:  LOPRESSOR      TAKE these medications       amLODipine 10 MG tablet  Commonly known as:  NORVASC  Take 1 tablet (10 mg total) by mouth daily.     captopril 25 MG tablet  Commonly known as:  CAPOTEN  Take 1 tablet (25 mg total) by mouth 3 (three) times daily.     carvedilol 6.25 MG tablet  Commonly known as:  COREG  Take 1 tablet (6.25 mg total) by mouth 2 (two) times daily with a meal.     furosemide 20 MG tablet  Commonly known as:  LASIX  Take 1 tablet (20 mg total) by mouth daily.     hydrALAZINE 25 MG tablet  Commonly known as:  APRESOLINE  Take 1 tablet (25 mg total) by mouth every 8 (eight) hours.     insulin aspart 100 UNIT/ML injection  Commonly known as:  novoLOG  For CBG: 70-120, No Insulin; CBG: 121-150, 2 units; CBG 151-200, 3 units; CBG: 201-250, 5 units.; CBG 251-300, 8 units; CBG: 301-350, 11 units; CBG: 351-400, 15 units.     insulin glargine 100 UNIT/ML injection  Commonly known as:  LANTUS  Inject 15 Units into the skin 2 (two) times daily.     insulin glargine 100 UNIT/ML injection  Commonly known as:  LANTUS  Inject 0.15 mLs  (15 Units total) into the skin at bedtime.       No Known Allergies     Follow-up Information   Follow up with [facility]     On 01/05/2014. (2:00 for hospital follow up.  )    Contact information:   [place] [address] [phone]      Follow up with COLADONATO,JOSEPH A, MD. Schedule an appointment as soon as possible for a visit in 2 weeks.   Specialty:  Nephrology   Contact information:   [place] [address] [phone]       Follow up with [facility] On 01/27/2014. (8 pm , they will mail information out to  you.)    Contact information:   [address] [place] [address] [phone]      Follow up with [facility] On 01/06/2014. ( 3:30 for orange card application)    Contact information:   [place] [address] [phone]       The results of significant diagnostics from this hospitalization (including imaging, microbiology, ancillary and laboratory) are listed below for reference.    Significant Diagnostic Studies: Dg Chest 2 View  12/20/2013   CLINICAL DATA:  Shortness of breath  EXAM: CHEST  2 VIEW  COMPARISON:  None.  FINDINGS: The lungs are clear and negative for focal airspace consolidation, pulmonary edema or suspicious pulmonary nodule. No pleural effusion or pneumothorax. Cardiac and mediastinal contours are within normal limits. No acute fracture or lytic or blastic osseous lesions. The visualized upper abdominal bowel gas pattern is unremarkable.  IMPRESSION: No active cardiopulmonary disease.   Electronically Signed   By: Jacqulynn Cadet M.D.   On: 12/20/2013 19:02   US Renal  12/21/2013   CLINICAL DATA:  Acute on chronic kidney disease. Prior medical renal disease.  EXAM: RENAL/URINARY TRACT ULTRASOUND COMPLETE  COMPARISON:  US RENAL dated 12/25/2011  FINDINGS: Right Kidney:   Length: 10 cm. Increased parenchymal echotexture diffusely consistent with chronic medical renal disease. No hydronephrosis.  Left Kidney:  Length: 11.4 cm. Increased parenchymal echotexture diffusely consistent with chronic medical renal disease. No hydronephrosis.  Bladder:  No wall thickening or filling defect. Bilateral urine flow jets are identified.  No significant changes since prior study.  IMPRESSION: Increase renal parenchymal echotexture bilaterally consistent with chronic medical renal disease. No hydronephrosis.   Electronically Signed   By: Lucienne Capers M.D.   On: 12/21/2013 00:46    Microbiology: No results found for this or any previous visit (from the past 240 hour(s)).   Labs: Basic Metabolic Panel:  Recent Labs Lab 12/20/13 1800 12/21/13 0510 12/22/13 0302 12/23/13 0729 12/24/13 0744  NA 144 139 139 136* 135*  K 4.8 5.6* 4.8 4.3 4.3  CL 105 102 101 97 97  CO2 22 19 20 20 21   GLUCOSE 126* 257* 144* 140* 129*  BUN 34* 38* 45* 49* 49*  CREATININE 3.63* 3.79* 3.80* 3.50* 3.61*  CALCIUM 10.1 9.6 10.2 10.1 9.8  PHOS  --   --  3.7  --  4.3   Liver Function Tests:  Recent Labs Lab 12/21/13 0510 12/22/13 0302 12/24/13 0744  AST 17  --   --   ALT 16  --   --   ALKPHOS 60  --   --   BILITOT 0.4  --   --   PROT 8.6*  --   --   ALBUMIN 3.2* 3.3* 3.3*   No results found for this basename: LIPASE, AMYLASE,  in the last 168 hours No results found for this basename: AMMONIA,  in the last 168 hours CBC:  Recent Labs Lab 12/20/13 1800 12/21/13 0510 12/23/13 0729 12/24/13 0744  WBC 9.2 14.5* 11.7* 10.6*  NEUTROABS  --  13.4*  --   --   HGB 14.8 13.0 14.8 14.8  HCT 45.6 40.1 44.1 43.7  MCV 82.2 82.0 81.2 80.0  PLT 280 297 325 320   Cardiac Enzymes:  Recent Labs Lab 12/21/13 0017 12/21/13 0451 12/21/13 1109  TROPONINI <0.30 <0.30 <0.30   BNP: BNP (last 3 results)  Recent Labs  12/20/13 1800  PROBNP 566.2*  CBG:  Recent Labs Lab  12/23/13 1249 12/23/13 1718 12/23/13 2048 12/24/13 0750 12/24/13 1238  GLUCAP 122* 142* 153* 120* 119*       Signed:  Thanya Cegielski  [facility] 12/24/2013, 7:12 PM

## 2013-12-24 NOTE — Progress Notes (Signed)
Patient ID: Patrick Ibarra, male   DOB: March 26, 1981, 33 y.o.   MRN: [id number] S:feels well O:BP 137/87  Pulse 77  Temp(Src) 98.1 F (36.7 C) (Oral)  Resp 20  Ht 6\' 1"  (1.854 m)  Wt 134.6 kg (296 lb 11.8 oz)  BMI 39.16 kg/m2  SpO2 95%  Intake/Output Summary (Last 24 hours) at 12/24/13 1305 Last data filed at 12/24/13 0959  Gross per 24 hour  Intake    780 ml  Output    500 ml  Net    280 ml   Intake/Output: I/O last 3 completed shifts: In: [id number] [P.O.:1102; I.V.:3] Out: 2700 [Urine:2700]  Intake/Output this shift:  Total I/O In: 780 [P.O.:780] Out: -  Weight change: 1.515 kg (3 lb 5.4 oz) Gen:WD WN AAM in NAD CVS:no rub Resp:cta [id number] Ext:no edema   Recent Labs Lab 12/20/13 1800 12/21/13 0510 12/22/13 0302 12/23/13 0729 12/24/13 0744  NA 144 139 139 136* 135*  K 4.8 5.6* 4.8 4.3 4.3  CL 105 102 101 97 97  CO2 22 19 20 20 21   GLUCOSE 126* 257* 144* 140* 129*  BUN 34* 38* 45* 49* 49*  CREATININE 3.63* 3.79* 3.80* 3.50* 3.61*  ALBUMIN  --  3.2* 3.3*  --  3.3*  CALCIUM 10.1 9.6 10.2 10.1 9.8  PHOS  --   --  3.7  --  4.3  AST  --  17  --   --   --   ALT  --  16  --   --   --    Liver Function Tests:  Recent Labs Lab 12/21/13 0510 12/22/13 0302 12/24/13 0744  AST 17  --   --   ALT 16  --   --   ALKPHOS 60  --   --   BILITOT 0.4  --   --   PROT 8.6*  --   --   ALBUMIN 3.2* 3.3* 3.3*   No results found for this basename: LIPASE, AMYLASE,  in the last 168 hours No results found for this basename: AMMONIA,  in the last 168 hours CBC:  Recent Labs Lab 12/20/13 1800 12/21/13 0510 12/23/13 0729 12/24/13 0744  WBC 9.2 14.5* 11.7* 10.6*  NEUTROABS  --  13.4*  --   --   HGB 14.8 13.0 14.8 14.8  HCT 45.6 40.1 44.1 43.7  MCV 82.2 82.0 81.2 80.0  PLT 280 297 325 320   Cardiac Enzymes:  Recent Labs Lab 12/21/13 0017 12/21/13 0451 12/21/13 1109  TROPONINI <0.30 <0.30 <0.30   CBG:  Recent Labs Lab 12/23/13 1249 12/23/13 1718 12/23/13 2048  12/24/13 0750 12/24/13 1238  GLUCAP 122* 142* 153* 120* 119*    Iron Studies: No results found for this basename: IRON, TIBC, TRANSFERRIN, FERRITIN,  in the last 72 hours Studies/Results: No results found. Marland Kitchen amLODipine  10 mg Oral Daily  . captopril  25 mg Oral TID  . furosemide  20 mg Oral Daily  . heparin  5,000 Units Subcutaneous 3 times per day  . hydrALAZINE  10 mg Intravenous Once  . hydrALAZINE  25 mg Oral 3 times per day  . insulin aspart  0-5 Units Subcutaneous QHS  . insulin aspart  0-9 Units Subcutaneous TID WC  . insulin glargine  15 Units Subcutaneous QHS  . metoprolol tartrate  50 mg Oral BID  . oxyCODONE-acetaminophen  1 tablet Oral Once  . sodium chloride  3 mL Intravenous Q12H    BMET    Component  Value Date/Time   NA 135* 12/24/2013 0744   K 4.3 12/24/2013 0744   CL 97 12/24/2013 0744   CO2 21 12/24/2013 0744   GLUCOSE 129* 12/24/2013 0744   BUN 49* 12/24/2013 0744   CREATININE 3.61* 12/24/2013 0744   CALCIUM 9.8 12/24/2013 0744   GFRNONAA 21* 12/24/2013 0744   GFRAA 24* 12/24/2013 0744   CBC    Component Value Date/Time   WBC 10.6* 12/24/2013 0744   RBC 5.46 12/24/2013 0744   HGB 14.8 12/24/2013 0744   HCT 43.7 12/24/2013 0744   PLT 320 12/24/2013 0744   MCV 80.0 12/24/2013 0744   MCH 27.1 12/24/2013 0744   MCHC 33.9 12/24/2013 0744   RDW 14.1 12/24/2013 0744   LYMPHSABS 1.0 12/21/2013 0510   MONOABS 0.1 12/21/2013 0510   EOSABS 0.0 12/21/2013 0510   BASOSABS 0.0 12/21/2013 0510     Assessment/Plan:  1. AKI/CKD- due to malignant hypertension and poorly controlled DM. 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 We discussed the ways to delay the progression of CKD:  1. BP control with goal <130/80 2. DM control with goal HbgA1c <7% 3. Use of an ACE/ARB 4. Avoidance of NSAIDs/COX-II I's 2. Urgent HTN- resumed norvasc 10mg  daily, metoprolol 50mg  bid, and added lasix 20mg  qd.  1. Started on captopril 25mg  tid with good response and need to make sure he has stable K/Cr  levels tomorrow 2. Recommend sleep study to r/o sleep apnea (high pre-test probability) hopefully can be done as an inpt 3. If Scr and K remain stable, will d/c on equivalent dose of lisinopril 3. DM- per primary svc 4. CKD stage 3-4: Will educate about CKD and RRT 5. Hyperkalemia- as above, treat hyperglycemia and start lasix. Educate on low K diet 6. Metabolic acidosis- follow after lasxi 7. SHPTH- iPTH 132, low hydroxy- vit D. Start calcitriol and follow. 8. Obesity- stressed diet and weight loss. He would greatly benefit from a nutrition consult. 9. Dispo- d/c to home tomorrow if K and Scr are stable and he has an appointment when BP stabilized. Consider sleep study while in the hospital or as an outpt, however insurance will be an issue. SW involved. 1. He will need a PCP and apptmt prior to d/c 2. He can f/u with our office as an outpt once he has been stabilized (appointment for 01/11/14 at 10:15 arranged) 10.   [name]

## 2013-12-24 NOTE — Progress Notes (Signed)
12/24/13 Patient is now being discharged home, IV site removed, Discharge instructions reviewed with patient and wife.

## 2013-12-26 LAB — VITAMIN D 1,25 DIHYDROXY
VITAMIN D 1, 25 (OH) TOTAL: 59 pg/mL (ref 18–72)
Vitamin D3 1, 25 (OH)2: 59 pg/mL

## 2014-01-05 ENCOUNTER — Ambulatory Visit: Payer: Medicaid Other | Attending: Cardiology | Admitting: Cardiology

## 2014-01-05 ENCOUNTER — Encounter: Payer: Self-pay | Admitting: Cardiology

## 2014-01-05 VITALS — BP 125/87 | HR 76 | Temp 98.0°F | Resp 16 | Ht 73.0 in | Wt 298.0 lb

## 2014-01-05 DIAGNOSIS — N179 Acute kidney failure, unspecified: Secondary | ICD-10-CM | POA: Diagnosis present

## 2014-01-05 DIAGNOSIS — Z794 Long term (current) use of insulin: Secondary | ICD-10-CM | POA: Diagnosis not present

## 2014-01-05 DIAGNOSIS — I10 Essential (primary) hypertension: Secondary | ICD-10-CM | POA: Diagnosis not present

## 2014-01-05 DIAGNOSIS — E119 Type 2 diabetes mellitus without complications: Secondary | ICD-10-CM | POA: Insufficient documentation

## 2014-01-05 MED ORDER — ACCU-CHEK SOFTCLIX LANCET DEV MISC: Status: DC

## 2014-01-05 MED ORDER — ACCU-CHEK AVIVA PLUS W/DEVICE KIT: 1.0000 | PACK | Freq: Four times a day (QID) | Status: DC

## 2014-01-05 MED ORDER — INSULIN SYRINGES (DISPOSABLE) U-100 0.3 ML MISC: 1.0000 | Freq: Two times a day (BID) | Status: DC

## 2014-01-05 MED ORDER — GLUCOSE BLOOD VI STRP: ORAL_STRIP | Status: DC

## 2014-01-05 NOTE — Progress Notes (Signed)
Pt is here to establish care and see the cardiologist. Last Monday he went to the ED with high BP. Pt states that he has a damaged heart valve with blockage. Pt has no C.C. Today.

## 2014-01-05 NOTE — Patient Instructions (Signed)
Thank you for coming in today Today's Instructions: continue taking prescribed medications.   Contact us or go to the ER if develop chest pain, sob.   Come back in 6 months to follow up with Dr.Wall

## 2014-01-05 NOTE — Progress Notes (Signed)
HPI Patrick Ibarra is a 33 year old married black male who comes today after being discharged from the hospital after presenting with shortness of breath and wheezing. He had accelerated hypertension and was not taking any of his meds. In fact, had not taken any meds for several years. He once had Medicaid but now does not meet criteria because of his wife's income.  EKG showed sinus tachycardia with borderline LVH. Blood work was unremarkable. Echocardiogram showed EF of 50% with diffuse hypokinesia. He had moderate LVH. His creatinine at discharge was 3.6. He does not recall having impaired kidney function but had not had blood work for several years prior to admission.  Since discharge she's been more careful with his diet, has been walking, and takes all of his medications. His wife reinforces this. Hemoglobin A1c was 7.5%.  He denies orthopnea, PND and no significant edema. He does have some dyspnea on exertion. He denies chest pain. Past Medical History  Diagnosis Date  . Hypertension   . Obesity   . LV dysfunction   . Uncontrolled diabetes mellitus     Current Outpatient Prescriptions  Medication Sig Dispense Refill  . amLODipine (NORVASC) 10 MG tablet Take 1 tablet (10 mg total) by mouth daily.  30 tablet  1  . captopril (CAPOTEN) 25 MG tablet Take 1 tablet (25 mg total) by mouth 3 (three) times daily.  90 tablet  0  . carvedilol (COREG) 6.25 MG tablet Take 1 tablet (6.25 mg total) by mouth 2 (two) times daily with a meal.  60 tablet  1  . furosemide (LASIX) 20 MG tablet Take 1 tablet (20 mg total) by mouth daily.  30 tablet  1  . hydrALAZINE (APRESOLINE) 25 MG tablet Take 1 tablet (25 mg total) by mouth every 8 (eight) hours.  90 tablet  1  . insulin aspart (NOVOLOG) 100 UNIT/ML injection For CBG: 70-120, No Insulin; CBG: 121-150, 2 units; CBG 151-200, 3 units; CBG: 201-250, 5 units.; CBG 251-300, 8 units; CBG: 301-350, 11 units; CBG: 351-400, 15 units.  1 vial  3  . insulin glargine  (LANTUS) 100 UNIT/ML injection Inject 0.15 mLs (15 Units total) into the skin at bedtime.  10 mL  2  . Blood Glucose Monitoring Suppl (ACCU-CHEK AVIVA PLUS) W/DEVICE KIT 1 applicator by Does not apply route 4 (four) times daily.  1 kit  0  . glucose blood (ACCU-CHEK AVIVA) test strip Use as instructed  100 each  12  . insulin glargine (LANTUS) 100 UNIT/ML injection Inject 15 Units into the skin 2 (two) times daily.  10 mL  3  . Insulin Syringes, Disposable, U-100 0.3 ML MISC 1 applicator by Does not apply route 2 (two) times daily.  100 each  12  . Lancet Devices (ACCU-CHEK SOFTCLIX) lancets Use as instructed  1 each  0   No current facility-administered medications for this visit.    No Known Allergies  Family History  Problem Relation Age of Onset  . Hypertension Mother   . Hypertension Father   . Diabetes Mother     History   Social History  . Marital Status: Married    Spouse Name: N/A    Number of Children: N/A  . Years of Education: N/A   Occupational History  . Not on file.   Social History Main Topics  . Smoking status: Never Smoker   . Smokeless tobacco: Not on file  . Alcohol Use: Yes     Comment: WINE ONCE A WEEK  .  Drug Use: No  . Sexual Activity: Not on file   Other Topics Concern  . Not on file   Social History Narrative   He is married with step kids. He does not work or have insurance    ROS ALL NEGATIVE EXCEPT THOSE NOTED IN HPI  PE  General Appearance: well developed, well nourished in no acute distress, muscular, obese HEENT: symmetrical face, PERRLA, good dentition  Neck: no JVD, thyromegaly, or adenopathy, trachea midline Chest: symmetric without deformity Cardiac: PMI non-displaced, RRR, normal S1, S2, no gallop or murmur Lung: clear to ausculation and percussion Vascular: all pulses full without bruits  Abdominal: nondistended, nontender, good bowel sounds, no HSM, no bruits Extremities: no cyanosis, clubbing or edema, no sign of DVT, no  varicosities  Skin: normal color, no rashes Neuro: alert and oriented x 3, non-focal Pysch: normal affect  EKG Not repeated BMET    Component Value Date/Time   NA 135* 12/24/2013 0744   K 4.3 12/24/2013 0744   CL 97 12/24/2013 0744   CO2 21 12/24/2013 0744   GLUCOSE 129* 12/24/2013 0744   BUN 49* 12/24/2013 0744   CREATININE 3.61* 12/24/2013 0744   CALCIUM 9.8 12/24/2013 0744   GFRNONAA 21* 12/24/2013 0744   GFRAA 24* 12/24/2013 0744    Lipid Panel  No results found for this basename: chol, trig, hdl, cholhdl, vldl, ldlcalc    CBC    Component Value Date/Time   WBC 10.6* 12/24/2013 0744   RBC 5.46 12/24/2013 0744   HGB 14.8 12/24/2013 0744   HCT 43.7 12/24/2013 0744   PLT 320 12/24/2013 0744   MCV 80.0 12/24/2013 0744   MCH 27.1 12/24/2013 0744   MCHC 33.9 12/24/2013 0744   RDW 14.1 12/24/2013 0744   LYMPHSABS 1.0 12/21/2013 0510   MONOABS 0.1 12/21/2013 0510   EOSABS 0.0 12/21/2013 0510   BASOSABS 0.0 12/21/2013 0510

## 2014-01-05 NOTE — Assessment & Plan Note (Signed)
Blood pressure under remarkably good control today. He is avoiding salt, and is losing weight, walking, and taking all his meds.  The biggest issue today is up discussed at length with he and his wife is his kidney disease. We will recheck a metabolic profile. We hope that his creatinine has fallen substantially. However, concerned that he has significant chronic kidney disease and may be looking at dialysis within the next several years. I made it clear to them that if he is compliant and keeps his blood pressure under good control that it may be longer than that. Will establish your primary care with proactive nephrology referral.

## 2014-01-06 ENCOUNTER — Inpatient Hospital Stay: Payer: Medicaid Other

## 2014-01-06 ENCOUNTER — Ambulatory Visit: Payer: Medicaid Other | Attending: Internal Medicine

## 2014-01-06 LAB — BASIC METABOLIC PANEL
BUN: 32 mg/dL — ABNORMAL HIGH (ref 6–23)
CALCIUM: 9.4 mg/dL (ref 8.4–10.5)
CO2: 24 meq/L (ref 19–32)
Chloride: 102 mEq/L (ref 96–112)
Creat: 3.16 mg/dL — ABNORMAL HIGH (ref 0.50–1.35)
Glucose, Bld: 137 mg/dL — ABNORMAL HIGH (ref 70–99)
POTASSIUM: 4.9 meq/L (ref 3.5–5.3)
SODIUM: 137 meq/L (ref 135–145)

## 2014-01-20 ENCOUNTER — Telehealth: Payer: Self-pay | Admitting: Emergency Medicine

## 2014-01-20 NOTE — Telephone Encounter (Signed)
Message copied by Ricci Barker on Thu Jan 20, 2014  5:00 PM ------      Message from: [place]: Wed Jan 12, 2014 11:40 AM       Creatinine slightly improved. No change in meds. Keep followup appointment with me. ------

## 2014-01-20 NOTE — Telephone Encounter (Signed)
Pt given normal lab results 

## 2014-01-27 ENCOUNTER — Ambulatory Visit (HOSPITAL_BASED_OUTPATIENT_CLINIC_OR_DEPARTMENT_OTHER): Payer: Self-pay | Attending: Internal Medicine

## 2014-03-07 ENCOUNTER — Ambulatory Visit: Payer: Medicaid Other | Admitting: Internal Medicine

## 2014-04-12 ENCOUNTER — Emergency Department (HOSPITAL_COMMUNITY)
Admission: EM | Admit: 2014-04-12 | Discharge: 2014-04-13 | Disposition: A | Discharge status: Home / Self Care | Payer: Medicaid Other | Attending: Emergency Medicine | Admitting: Emergency Medicine

## 2014-04-12 ENCOUNTER — Encounter (HOSPITAL_COMMUNITY): Payer: Self-pay | Admitting: Emergency Medicine

## 2014-04-12 ENCOUNTER — Emergency Department (HOSPITAL_COMMUNITY): Payer: Medicaid Other

## 2014-04-12 DIAGNOSIS — I10 Essential (primary) hypertension: Secondary | ICD-10-CM | POA: Insufficient documentation

## 2014-04-12 DIAGNOSIS — E119 Type 2 diabetes mellitus without complications: Secondary | ICD-10-CM | POA: Insufficient documentation

## 2014-04-12 DIAGNOSIS — Z79899 Other long term (current) drug therapy: Secondary | ICD-10-CM | POA: Insufficient documentation

## 2014-04-12 DIAGNOSIS — Z794 Long term (current) use of insulin: Secondary | ICD-10-CM | POA: Insufficient documentation

## 2014-04-12 DIAGNOSIS — E669 Obesity, unspecified: Secondary | ICD-10-CM | POA: Diagnosis not present

## 2014-04-12 DIAGNOSIS — I82409 Acute embolism and thrombosis of unspecified deep veins of unspecified lower extremity: Secondary | ICD-10-CM | POA: Insufficient documentation

## 2014-04-12 DIAGNOSIS — M25569 Pain in unspecified knee: Secondary | ICD-10-CM | POA: Diagnosis present

## 2014-04-12 DIAGNOSIS — Z299 Encounter for prophylactic measures, unspecified: Secondary | ICD-10-CM

## 2014-04-12 NOTE — ED Notes (Signed)
Pt c/o right knee pain since Monday. Pt reports pain increases with activity. Pt denies any recent injury, fall, or trauma to right knee.

## 2014-04-13 ENCOUNTER — Ambulatory Visit (HOSPITAL_COMMUNITY)
Admission: RE | Admit: 2014-04-13 | Discharge: 2014-04-13 | Disposition: A | Discharge status: Home / Self Care | Payer: Medicaid Other | Source: Ambulatory Visit

## 2014-04-13 ENCOUNTER — Telehealth (HOSPITAL_BASED_OUTPATIENT_CLINIC_OR_DEPARTMENT_OTHER): Payer: Self-pay | Admitting: Emergency Medicine

## 2014-04-13 DIAGNOSIS — M79609 Pain in unspecified limb: Secondary | ICD-10-CM

## 2014-04-13 MED ORDER — HYDROCODONE-ACETAMINOPHEN 5-325 MG PO TABS
1.0000 | ORAL_TABLET | Freq: Once | ORAL | Status: AC
  Administered 2014-04-13: 1 via ORAL
  Filled 2014-04-13: qty 1

## 2014-04-13 MED ORDER — ENOXAPARIN SODIUM 150 MG/ML ~~LOC~~ SOLN
135.0000 mg | SUBCUTANEOUS | Status: DC
  Filled 2014-04-13 (×2): qty 1

## 2014-04-13 NOTE — ED Notes (Signed)
See Paper Charting

## 2014-04-13 NOTE — ED Provider Notes (Signed)
Medical screening examination/treatment/procedure(s) were performed by non-physician practitioner and as supervising physician I was immediately available for consultation/collaboration.   EKG Interpretation None        Julianne Rice, MD 04/13/14 [phone]

## 2014-04-13 NOTE — ED Provider Notes (Signed)
CSN: [id number]     Arrival date & time 04/12/14  2249 History   First MD Initiated Contact with Patient 04/12/14 2344     Chief Complaint  Patient presents with  . Knee Pain     (Consider location/radiation/quality/duration/timing/severity/associated sxs/prior Treatment) HPI Comments: Patient has been having intermittent pain and swelling to his right lower leg.  For the past several weeks.  He was recently taken a trip where he sat for long periods of time, which exacerbated his symptoms.  He is complaining of increased pain, posterior knee, decreased range of motion, pain in his ankle.  That radiates to his mid calf and swelling with ambulation.  Denies any shortness of breath, or tachycardia, injury to the ankle or leg.  He has tried elevation and ice with little relief  Patient is a 33 y.o. male presenting with knee pain.  Knee Pain Location:  Knee and leg Injury: no   Leg location:  R leg and R lower leg Knee location:  R knee Pain details:    Quality:  Aching   Radiates to:  Does not radiate   Severity:  Moderate   Onset quality:  Gradual   Timing:  Constant   Progression:  Worsening Chronicity:  New Dislocation: no   Foreign body present:  No foreign bodies Prior injury to area:  No Worsened by:  Nothing tried Ineffective treatments:  None tried Associated symptoms: decreased ROM   Associated symptoms: no fever and no numbness     Past Medical History  Diagnosis Date  . Hypertension   . Obesity   . LV dysfunction   . Uncontrolled diabetes mellitus    Past Surgical History  Procedure Laterality Date  . Tonsillectomy and adenoidectomy     Family History  Problem Relation Age of Onset  . Hypertension Mother   . Hypertension Father   . Diabetes Mother    History  Substance Use Topics  . Smoking status: Never Smoker   . Smokeless tobacco: Not on file  . Alcohol Use: Yes     Comment: WINE ONCE A WEEK    Review of Systems  Constitutional: Negative for  fever.  Musculoskeletal: Positive for joint swelling.  Skin: Negative for wound.  Neurological: Negative for weakness and numbness.  All other systems reviewed and are negative.     Allergies  Review of patient's allergies indicates no known allergies.  Home Medications   Prior to Admission medications   Medication Sig Start Date End Date Taking? Authorizing Provider  amLODipine (NORVASC) 10 MG tablet Take 1 tablet (10 mg total) by mouth daily. 12/24/13   Hosie Poisson, MD  Blood Glucose Monitoring Suppl (ACCU-CHEK AVIVA PLUS) W/DEVICE KIT 1 applicator by Does not apply route 4 (four) times daily. 01/05/14   Renella Cunas, MD  captopril (CAPOTEN) 25 MG tablet Take 1 tablet (25 mg total) by mouth 3 (three) times daily. 12/24/13   Hosie Poisson, MD  carvedilol (COREG) 6.25 MG tablet Take 1 tablet (6.25 mg total) by mouth 2 (two) times daily with a meal. 12/24/13   Hosie Poisson, MD  furosemide (LASIX) 20 MG tablet Take 1 tablet (20 mg total) by mouth daily. 12/24/13   Hosie Poisson, MD  glucose blood (ACCU-CHEK AVIVA) test strip Use as instructed 01/05/14   Renella Cunas, MD  hydrALAZINE (APRESOLINE) 25 MG tablet Take 1 tablet (25 mg total) by mouth every 8 (eight) hours. 12/24/13   Hosie Poisson, MD  insulin aspart (NOVOLOG) 100 UNIT/ML  injection For CBG: 70-120, No Insulin; CBG: 121-150, 2 units; CBG 151-200, 3 units; CBG: 201-250, 5 units.; CBG 251-300, 8 units; CBG: 301-350, 11 units; CBG: 351-400, 15 units. 12/27/11   Monika Salk, MD  insulin glargine (LANTUS) 100 UNIT/ML injection Inject 15 Units into the skin 2 (two) times daily. 12/27/11 12/26/12  Monika Salk, MD  insulin glargine (LANTUS) 100 UNIT/ML injection Inject 0.15 mLs (15 Units total) into the skin at bedtime. 12/24/13   Hosie Poisson, MD  Insulin Syringes, Disposable, U-100 0.3 ML MISC 1 applicator by Does not apply route 2 (two) times daily. 01/05/14   Renella Cunas, MD  Lancet Devices [facility]) lancets Use as instructed 01/05/14    Renella Cunas, MD   BP 167/104  Pulse 92  Temp(Src) 98 F (36.7 C) (Oral)  Resp 18  SpO2 100% Physical Exam  Vitals reviewed. Constitutional: He is oriented to person, place, and time. He appears well-developed and well-nourished.  HENT:  Head: Normocephalic.  Neck: Normal range of motion.  Cardiovascular: Normal rate.   Pulmonary/Chest: Effort normal.  Musculoskeletal: He exhibits edema and tenderness.       Legs: Neurological: He is alert and oriented to person, place, and time.    ED Course  Procedures (including critical care time) Labs Review Labs Reviewed - No data to display  Imaging Review No results found.   EKG Interpretation None      MDM   Final diagnoses:  DVT prophylaxis     Patient has been given an injection of Lovenox, and scheduled to return in the morning for a vascular study.  Concern for a persist, more than DVT, although he is at risk.    Garald Balding, NP 04/13/14 [phone]

## 2014-04-13 NOTE — Discharge Instructions (Signed)
you have  suspected deep vein thrombosis versus Baker's cyst.  In your right knee, leg, You have  been given an injection of Lovenox as an anti-coagulant and scheduled for a vascular Doppler, in the morning.  Please return to [facility] for this procedure Please report to the radiology department.  At [facility] at 8:30 in the morning.  Present.  This discharge instruction to them so they can see the order

## 2015-07-24 ENCOUNTER — Emergency Department (HOSPITAL_COMMUNITY): Payer: Medicaid Other

## 2015-07-24 ENCOUNTER — Emergency Department (HOSPITAL_COMMUNITY)
Admission: EM | Admit: 2015-07-24 | Discharge: 2015-07-25 | Disposition: A | Discharge status: Home / Self Care | Payer: Medicaid Other | Attending: Emergency Medicine | Admitting: Emergency Medicine

## 2015-07-24 ENCOUNTER — Encounter (HOSPITAL_COMMUNITY): Payer: Self-pay | Admitting: *Deleted

## 2015-07-24 DIAGNOSIS — R030 Elevated blood-pressure reading, without diagnosis of hypertension: Secondary | ICD-10-CM

## 2015-07-24 DIAGNOSIS — E119 Type 2 diabetes mellitus without complications: Secondary | ICD-10-CM | POA: Insufficient documentation

## 2015-07-24 DIAGNOSIS — Z794 Long term (current) use of insulin: Secondary | ICD-10-CM | POA: Insufficient documentation

## 2015-07-24 DIAGNOSIS — M722 Plantar fascial fibromatosis: Secondary | ICD-10-CM

## 2015-07-24 DIAGNOSIS — I10 Essential (primary) hypertension: Secondary | ICD-10-CM | POA: Insufficient documentation

## 2015-07-24 DIAGNOSIS — Z79899 Other long term (current) drug therapy: Secondary | ICD-10-CM | POA: Insufficient documentation

## 2015-07-24 DIAGNOSIS — E669 Obesity, unspecified: Secondary | ICD-10-CM | POA: Insufficient documentation

## 2015-07-24 DIAGNOSIS — IMO0001 Reserved for inherently not codable concepts without codable children: Secondary | ICD-10-CM

## 2015-07-24 MED ORDER — CARVEDILOL 6.25 MG PO TABS
6.2500 mg | ORAL_TABLET | Freq: Once | ORAL | Status: AC
  Administered 2015-07-24: 6.25 mg via ORAL
  Filled 2015-07-24: qty 1

## 2015-07-24 MED ORDER — NAPROXEN 500 MG PO TABS: 500.0000 mg | ORAL_TABLET | Freq: Two times a day (BID) | ORAL | Status: DC

## 2015-07-24 MED ORDER — AMLODIPINE BESYLATE 10 MG PO TABS: 10.0000 mg | ORAL_TABLET | Freq: Every day | ORAL | Status: DC

## 2015-07-24 MED ORDER — AMLODIPINE BESYLATE 5 MG PO TABS
10.0000 mg | ORAL_TABLET | Freq: Once | ORAL | Status: AC
  Administered 2015-07-24: 10 mg via ORAL
  Filled 2015-07-24: qty 2

## 2015-07-24 MED ORDER — CLONIDINE HCL 0.2 MG PO TABS
0.2000 mg | ORAL_TABLET | Freq: Once | ORAL | Status: AC
  Administered 2015-07-24: 0.2 mg via ORAL
  Filled 2015-07-24: qty 1

## 2015-07-24 MED ORDER — CAPTOPRIL 25 MG PO TABS
25.0000 mg | ORAL_TABLET | Freq: Once | ORAL | Status: AC
  Administered 2015-07-24: 25 mg via ORAL
  Filled 2015-07-24: qty 1

## 2015-07-24 NOTE — ED Provider Notes (Signed)
CSN: [id number]     Arrival date & time 07/24/15  1744 History   First MD Initiated Contact with Patient 07/24/15 2106     Chief Complaint  Patient presents with  . Foot Pain     (Consider location/radiation/quality/duration/timing/severity/associated sxs/prior Treatment) HPI  Patrick Ibarra is a 34 y.o. male  with a PMH of uncontrolled DM & uncontrolled HTN who presents to the Emergency Department complaining intermittent, aching heel pain x 2-3 months. Alleviated with rest, worse first thing in the morning. No radiation of the pain, no medications tried for pain PTA. Pt. Has history of HTN and is suppose to be on medication - states he has not gotten them filled due to cost.    Past Medical History  Diagnosis Date  . Hypertension   . Obesity   . LV dysfunction   . Uncontrolled diabetes mellitus [facility])    Past Surgical History  Procedure Laterality Date  . Tonsillectomy and adenoidectomy     Family History  Problem Relation Age of Onset  . Hypertension Mother   . Hypertension Father   . Diabetes Mother    Social History  Substance Use Topics  . Smoking status: Never Smoker   . Smokeless tobacco: None  . Alcohol Use: Yes     Comment: WINE ONCE A WEEK    Review of Systems  Constitutional: Negative.   HENT: Negative for congestion, rhinorrhea and sore throat.   Eyes: Negative for visual disturbance.  Respiratory: Negative for cough, shortness of breath and wheezing.   Cardiovascular: Negative for chest pain, palpitations and leg swelling.  Gastrointestinal: Negative for nausea, vomiting, abdominal pain, diarrhea and constipation.  Endocrine: Negative for polydipsia and polyuria.  Musculoskeletal: Positive for myalgias. Negative for back pain and arthralgias.  Skin: Negative for rash.  Neurological: Negative for dizziness, weakness and headaches.      Allergies  Review of patient's allergies indicates no known allergies.  Home Medications   Prior to Admission  medications   Medication Sig Start Date End Date Taking? Authorizing Provider  acetaminophen (TYLENOL) 500 MG tablet Take 1,000 mg by mouth every 6 (six) hours as needed for moderate pain.   Yes Historical Provider, MD  ibuprofen (ADVIL,MOTRIN) 200 MG tablet Take 400 mg by mouth every 6 (six) hours as needed for moderate pain.   Yes Historical Provider, MD  amLODipine (NORVASC) 10 MG tablet Take 1 tablet (10 mg total) by mouth daily. Patient not taking: Reported on 07/24/2015 12/24/13   Hosie Poisson, MD  Blood Glucose Monitoring Suppl (ACCU-CHEK AVIVA PLUS) W/DEVICE KIT 1 applicator by Does not apply route 4 (four) times daily. 01/05/14   Renella Cunas, MD  captopril (CAPOTEN) 25 MG tablet Take 1 tablet (25 mg total) by mouth 3 (three) times daily. Patient not taking: Reported on 07/24/2015 12/24/13   Hosie Poisson, MD  carvedilol (COREG) 6.25 MG tablet Take 1 tablet (6.25 mg total) by mouth 2 (two) times daily with a meal. Patient not taking: Reported on 07/24/2015 12/24/13   Hosie Poisson, MD  furosemide (LASIX) 20 MG tablet Take 1 tablet (20 mg total) by mouth daily. Patient not taking: Reported on 07/24/2015 12/24/13   Hosie Poisson, MD  glucose blood (ACCU-CHEK AVIVA) test strip Use as instructed Patient not taking: Reported on 07/24/2015 01/05/14   Renella Cunas, MD  hydrALAZINE (APRESOLINE) 25 MG tablet Take 1 tablet (25 mg total) by mouth every 8 (eight) hours. Patient not taking: Reported on 07/24/2015 12/24/13   Jeoffrey Massed  Karleen Hampshire, MD  insulin aspart (NOVOLOG) 100 UNIT/ML injection For CBG: 70-120, No Insulin; CBG: 121-150, 2 units; CBG 151-200, 3 units; CBG: 201-250, 5 units.; CBG 251-300, 8 units; CBG: 301-350, 11 units; CBG: 351-400, 15 units. Patient not taking: Reported on 07/24/2015 12/27/11   Monika Salk, MD  insulin glargine (LANTUS) 100 UNIT/ML injection Inject 15 Units into the skin 2 (two) times daily. Patient not taking: Reported on 07/24/2015 12/27/11 07/24/15  Monika Salk, MD  insulin  glargine (LANTUS) 100 UNIT/ML injection Inject 0.15 mLs (15 Units total) into the skin at bedtime. 12/24/13   Hosie Poisson, MD  Insulin Syringes, Disposable, U-100 0.3 ML MISC 1 applicator by Does not apply route 2 (two) times daily. 01/05/14   Renella Cunas, MD  Lancet Devices [facility]) lancets Use as instructed 01/05/14   Renella Cunas, MD  naproxen (NAPROSYN) 500 MG tablet Take 1 tablet (500 mg total) by mouth 2 (two) times daily. 07/24/15   Jaime Pilcher Ward, PA-C   BP 181/121 mmHg  Pulse 91  Temp(Src) 98 F (36.7 C) (Oral)  Resp 18  Ht 6' 1" (1.854 m)  Wt 140.615 kg  BMI 40.91 kg/m2  SpO2 99% Physical Exam  Constitutional: He is oriented to person, place, and time. He appears well-developed and well-nourished.  Alert and in no acute distress  HENT:  Head: Normocephalic and atraumatic.  Cardiovascular: Normal rate, regular rhythm, normal heart sounds and intact distal pulses.  Exam reveals no gallop and no friction rub.   No murmur heard. Pulmonary/Chest: Effort normal and breath sounds normal. No respiratory distress. He has no wheezes. He has no rales. He exhibits no tenderness.  Abdominal: He exhibits no mass. There is no rebound and no guarding.  Abdomen soft, non-tender, non-distended Bowel sounds positive in all four quadrants  Musculoskeletal:  Tenderness to palpation of left plantar surface from heel to mid foot. Full ROM without pain; strength 5/5 No 5th metatarsal or malleolar tenderness.   Neurological: He is alert and oriented to person, place, and time.  Skin: Skin is warm and dry. No rash noted.  Psychiatric: He has a normal mood and affect. His behavior is normal. Judgment and thought content normal.  Nursing note and vitals reviewed.   ED Course  Procedures (including critical care time) Labs Review Labs Reviewed - No data to display  Imaging Review Dg Foot Complete Right  07/24/2015  CLINICAL DATA:  Throbbing right calcaneus pain for 1 week,  worsening. No known injury. Initial encounter. EXAM: RIGHT FOOT COMPLETE - 3+ VIEW COMPARISON:  None. FINDINGS: No acute bony or joint abnormality is identified. Pes planus deformity is noted. Mild midfoot degenerative change is seen. Small dorsal calcaneal spur is identified. IMPRESSION: No acute abnormality. Pes planus. Mild midfoot degenerative change. Small dorsal calcaneal spur. Electronically Signed   By: Inge Rise M.D.   On: 07/24/2015 19:29   I have personally reviewed and evaluated these images and lab results as part of my medical decision-making.   EKG Interpretation None      MDM   Final diagnoses:  Elevated blood pressure  Plantar fasciitis of right foot  Patrick Ibarra presented for likely plantar fascitis, however BP of 211/162 - 0.2 clonidine and 25 captopril given. Pt. States he has been non-compliant with bp meds 2/2 loss of insurance.   11:25 PM - Patient reassessed after taking clonidine along with usual home medications. BP now 179/126. Given that patient is asymptomatic and has been  non-compliant with meds for months, Dr. Dayna Barker and I believe he is stable for discharge with this BP.   A/P  - Elevated BP: urge to see PCP and begin taking BP medication regularly as directed  - Plantar fascitis: will dc with naproxen and home symptomatic care instructions   I explained the diagnosis and have given precautions as to when/if to return to the ER including for any other new or worsening symptoms. The patient understands and accepts the medical plan as it's been dictated and I have answered their questions. Discharge instructions concerning home care and prescriptions have been given. The patient is stable and is discharged to home in good condition.  Patient discussed with Dr. Dayna Barker who agrees with treatment plan.     [facility] Ward, PA-C 07/24/15 2707  Merrily Pew, MD 07/25/15 [phone]

## 2015-07-24 NOTE — Discharge Instructions (Signed)
It was my pleasure taking care of you today!   1. Medications: Naproxen for pain and inflammation - this is often better tolerated with food, continue usual home medications. It is important to take your blood pressure medication as directed! 2. Treatment: rest, ice the affected area 20 minutes at a time, 4-5 times per day, consider getting orthopedic insoles for your shoes. 3. Follow Up: Please follow up with your primary doctor in 3 days for discussion of your diagnoses and further evaluation after today's visit; if you do not have a primary care doctor use the resource guide provided to find one; Please return to the ER for any new or worsening pain, any additional concerns.     Plantar Fasciitis Plantar fasciitis is a painful foot condition that affects the heel. It occurs when the band of tissue that connects the toes to the heel bone (plantar fascia) becomes irritated. This can happen after exercising too much or doing other repetitive activities (overuse injury). The pain from plantar fasciitis can range from mild irritation to severe pain that makes it difficult for you to walk or move. The pain is usually worse in the morning or after you have been sitting or lying down for a while. CAUSES This condition may be caused by:  Standing for long periods of time.  Wearing shoes that do not fit.  Doing high-impact activities, including running, aerobics, and ballet.  Being overweight.  Having an abnormal way of walking (gait).  Having tight calf muscles.  Having high arches in your feet.  Starting a new athletic activity. SYMPTOMS The main symptom of this condition is heel pain. Other symptoms include:  Pain that gets worse after activity or exercise.  Pain that is worse in the morning or after resting.  Pain that goes away after you walk for a few minutes. DIAGNOSIS This condition may be diagnosed based on your signs and symptoms. Your health care provider will also do a  physical exam to check for:  A tender area on the bottom of your foot.  A high arch in your foot.  Pain when you move your foot.  Difficulty moving your foot. You may also need to have imaging studies to confirm the diagnosis. These can include:  X-rays.  Ultrasound.  MRI. TREATMENT  Treatment for plantar fasciitis depends on the severity of the condition. Your treatment may include:  Rest, ice, and over-the-counter pain medicines to manage your pain.  Exercises to stretch your calves and your plantar fascia.  A splint that holds your foot in a stretched, upward position while you sleep (night splint).  Physical therapy to relieve symptoms and prevent problems in the future.  Cortisone injections to relieve severe pain.  Extracorporeal shock wave therapy (ESWT) to stimulate damaged plantar fascia with electrical impulses. It is often used as a last resort before surgery.  Surgery, if other treatments have not worked after 12 months. HOME CARE INSTRUCTIONS  Take medicines only as directed by your health care provider.  Avoid activities that cause pain.  Roll the bottom of your foot over a bag of ice or a bottle of cold water. Do this for 20 minutes, 3-4 times a day.  Perform simple stretches as directed by your health care provider.  Try wearing athletic shoes with air-sole or gel-sole cushions or soft shoe inserts.  Wear a night splint while sleeping, if directed by your health care provider.  Keep all follow-up appointments with your health care provider. PREVENTION  Do not perform exercises or activities that cause heel pain.  Consider finding low-impact activities if you continue to have problems.  Lose weight if you need to. The best way to prevent plantar fasciitis is to avoid the activities that aggravate your plantar fascia. SEEK MEDICAL CARE IF:  Your symptoms do not go away after treatment with home care measures.  Your pain gets worse.  Your  pain affects your ability to move or do your daily activities.   This information is not intended to replace advice given to you by your health care provider. Make sure you discuss any questions you have with your health care provider.   Document Released: 05/14/2001 Document Revised: 05/10/2015 Document Reviewed: 06/29/2014 Elsevier Interactive Patient Education 2016 [employer].   Emergency Department Resource Guide 1) Find a Doctor and Pay Out of Pocket Although you won't have to find out who is covered by your insurance plan, it is a good idea to ask around and get recommendations. You will then need to call the office and see if the doctor you have chosen will accept you as a new patient and what types of options they offer for patients who are self-pay. Some doctors offer discounts or will set up payment plans for their patients who do not have insurance, but you will need to ask so you aren't surprised when you get to your appointment.  2) Contact Your Local Health Department Not all health departments have doctors that can see patients for sick visits, but many do, so it is worth a call to see if yours does. If you don't know where your local health department is, you can check in your phone book. The CDC also has a tool to help you locate your state's health department, and many state websites also have listings of all of their local health departments.  3) Find a [facility] If your illness is not likely to be very severe or complicated, you may want to try a walk in clinic. These are popping up all over the country in pharmacies, drugstores, and shopping centers. They're usually staffed by nurse practitioners or physician assistants that have been trained to treat common illnesses and complaints. They're usually fairly quick and inexpensive. However, if you have serious medical issues or chronic medical problems, these are probably not your best option.  No Primary Care  Doctor: - Call Health Connect at  [phone] - they can help you locate a primary care doctor that  accepts your insurance, provides certain services, etc. - Physician Referral Service- [phone]  Chronic Pain Problems: Organization         Address  Phone   Notes  [facility]  [phone] Patients need to be referred by their primary care doctor.   Medication Assistance: Organization         Address  Phone   Notes  [facility] Medication [facility] : [facility] [address] [place], [place] [address] [phone] --Must be a resident of [facility] -- Must have NO insurance coverage whatsoever (no Medicaid/ Medicare, etc.) -- The pt. MUST have a primary care doctor that directs their care regularly and follows them in the community   MedAssist  [phone]   [employer]  [phone]    Agencies that provide inexpensive medical care: Organization         Address  Phone   Notes  [place]  [phone]   [facility] Pontes  Internal Medicine    [phone]   [facility] [place], [place] [address] [phone]   [place] [address], [place] [phone]   Planned Parenthood    [phone]   [facility]    [phone]   [place] and [place] [address], [place] Phone:  [phone], Fax:  [phone] Hours of Operation:  9 am - 6 pm, M-F.  Also accepts Medicaid/Medicare and self-pay.  [facility] for [place], [address], [place] Phone: [phone], Fax: [phone]. Hours of Operation:  8:30 am - 5:30 pm, M-F.  Also accepts Medicaid and self-pay.  [facility] [place] [address] [place] Phone: [phone]   [place], [place], [place] [phone], Ext. [phone] Mondays & Thursdays: 7-9 AM.  First 15 patients are seen on a first  come, first serve basis.    [place] Providers:  Organization         Address  Phone   Notes  [facility] [address], [place] [phone] Also accepts self-pay patients.  [place] [id number] [place], [place]  [phone]   [place], [address], [place] [phone]   [facility] [address] [place] [phone]   Lucianne Lei [address], [place]   [phone] Only accepts [place] Access [place] patients after they have their name applied to their card.   Self-Pay (no insurance) in [facility]:  Organization         Address  Phone   Notes  Sickle Cell Patients, [facility] Internal Medicine [place] [phone]   [facility] [place] [phone]   [facility] [place]  [place], [place], [place] [phone]   [facility]/Dr. Osei-Bonsu  [address] [place] or [address], [place] [phone] Phone number for both [place] and [place] locations is the same.  Urgent Medical and [facility] [address] [place] [phone]   [facility] [address], [place] or [address] [phone]   [facility] [address] [place] [phone], phone; [phone], fax Sees patients 1st and 3rd Saturday of every month.  Must not qualify for public or private insurance (i.e. Medicaid, Medicare, [place] Health Choice, Veterans' Benefits)  Household income should be no more than 200% of the poverty level The clinic cannot treat you if you are pregnant or think you are pregnant  Sexually transmitted diseases are not treated at the clinic.    Dental Care: Organization          Address  Phone  Notes  [facility] Department of [facility] [place] [phone] Accepts children up to age 13 who are enrolled in [place] or [place]; pregnant women with a Medicaid card; and children who have applied for Medicaid or [place] Health Choice, but were declined, whose parents can pay a reduced fee at time of service.  [facility] Department of [facility]  [address], [employer] (  [phone] Accepts children up to age 57 who are enrolled in Medicaid or [place]; pregnant women with a Medicaid card; and children who have applied for Medicaid or [place] Health Choice, but were declined, whose parents can pay a reduced fee at time of service.  [facility] Adult Dental Access PROGRAM  [place] [phone] Patients are seen by appointment only. Walk-ins are not accepted. [place] will see patients 65 years of age and older. Monday - Tuesday (8am-5pm) Most Wednesdays (8:30-5pm) $30 per visit, cash only  [facility] Adult Dental Access PROGRAM  [address], [facility] [phone] Patients are seen by appointment only. Walk-ins are not accepted. [place] will see patients 67 years of age and older. One Wednesday Evening (Monthly: Volunteer Based).  $30 per visit, cash only  [place]  [phone] for adults; Children under age 76, call Graduate Pediatric Dentistry at [phone]. Children aged 40-14, please call [phone] to request a pediatric application.  Dental services are provided in all areas of dental care including fillings, crowns and bridges, complete and partial dentures, implants, gum treatment, root canals, and extractions. Preventive care is also provided. Treatment is provided to both adults and children. Patients are selected via a lottery and there is often a waiting list.   [facility] [address] [place]  [phone] [email or web address]   [facility] [address] [place], [place] [phone], Ext. [phone] Second and Fourth Thursday of each month, opens at 6:30 AM; Clinic ends at 9 AM.  Patients are seen on a first-come first-served basis, and a limited number are seen during each clinic.   [facility]  [address] Hillard Danker [place], [place] [phone]   Eligibility Requirements You must have lived in [place], [place], or [place] counties for at least the last three months.   You cannot be eligible for state or federal sponsored [employer], including [employer], [place], or [employer].   You generally cannot be eligible for healthcare insurance through your employer.    How to apply: Eligibility screenings are held every Tuesday and Wednesday afternoon from 1:00 pm until 4:00 pm. You do not need an appointment for the interview!  [facility] [address] [place], [place]   [place]  [place] Department  [place]  [phone]    Behavioral Health Resources in the Community: Intensive Outpatient Programs Organization         Address  Phone  Notes  [place]. [address], [place], [place] [phone]   [facility] Outpatient [address] [place], [place]   ADS: Alcohol & Drug Svcs [address], [place], [place]   [place] [address],  [place], [place] or [phone]   Substance Abuse Resources Organization         Address  Phone  Notes  Alcohol and Drug Services  [phone]   [place]  [phone]   [facility] [place]   Chinita [place]ester  [phone]   Residential & Outpatient Substance Abuse Program  [phone]   Psychological  Services Organization         Address  Phone  Notes  [place]  [place]  [phone]  [place] [address], [place] or [phone]    Mobile Crisis Teams Organization         Address  Phone  Notes  [facility], Mobile Crisis Care Unit  [phone]   [facility]  [address] [place], [place]   [place] Levels [address] [place] [phone]    Self-Help/Support Groups Organization         Address  Phone             Notes  [place]. of [place] - variety of support groups  [place] Call for more information  Narcotics Anonymous (NA), [facility]aring [facility] [address], [employer] [place]  2 meetings at this location   Special educational needs teacher         Address  Phone  Notes  [facility] Residential Treatment [place],    [place]  [phone]   [facility]  [address], [place] [phone], [place], [place]   [place], [place] [phone] Admissions: 8am-3pm M-F  Incentives Substance [place] [address],    [place], [place] [phone]   [facility] [address] [place], [place], [place]   [facility] [address]  [place], [place]   [facility] Programs - Intensive Outpatient [address] Kristeen [address], [place], [place]   [facility] ([place].,  [place], [place] [phone] or [phone]   [facility] Treatment Services (RTS) [address], [place], [place] Accepts Medicaid  [facility] [place] [address],  [place] [phone] Substance Abuse/Addiction Treatment   [facility] Organization         Address  Phone  Notes  [facility]  [phone]   Domenic Schwab, PhD [address] Arlis Porta [place], [place]   [phone] or [phone]   [facility] [place], [place] [phone]   [facility] [address] [place], [place] [phone] Insurance/Medicaid/sponsorship through [facility] and Families [address] [place]                                    [place], [place] [phone] [place] [address], [place] [phone]    Dr. Adele Schilder  [phone]   [facility] Dept. 1) [address] [place] 2) [place] 3)  [place] [address], [place] [phone]  [phone]   [place] [phone] or [phone] (After Hours)

## 2015-07-24 NOTE — ED Notes (Signed)
EDP is aware of pt BP and is at bedside

## 2015-07-24 NOTE — ED Notes (Addendum)
Pt reports right heel pain for extended amount of time, denies injury or swelling. Pt hypertensive at triage.

## 2017-02-28 ENCOUNTER — Encounter (HOSPITAL_COMMUNITY): Payer: Self-pay | Admitting: Emergency Medicine

## 2017-02-28 DIAGNOSIS — E872 Acidosis: Secondary | ICD-10-CM | POA: Diagnosis present

## 2017-02-28 DIAGNOSIS — I5023 Acute on chronic systolic (congestive) heart failure: Secondary | ICD-10-CM | POA: Diagnosis present

## 2017-02-28 DIAGNOSIS — Z8249 Family history of ischemic heart disease and other diseases of the circulatory system: Secondary | ICD-10-CM

## 2017-02-28 DIAGNOSIS — E669 Obesity, unspecified: Secondary | ICD-10-CM | POA: Diagnosis present

## 2017-02-28 DIAGNOSIS — N179 Acute kidney failure, unspecified: Secondary | ICD-10-CM | POA: Diagnosis present

## 2017-02-28 DIAGNOSIS — E1122 Type 2 diabetes mellitus with diabetic chronic kidney disease: Secondary | ICD-10-CM | POA: Diagnosis present

## 2017-02-28 DIAGNOSIS — Z79899 Other long term (current) drug therapy: Secondary | ICD-10-CM

## 2017-02-28 DIAGNOSIS — Z515 Encounter for palliative care: Secondary | ICD-10-CM | POA: Diagnosis present

## 2017-02-28 DIAGNOSIS — D631 Anemia in chronic kidney disease: Secondary | ICD-10-CM | POA: Diagnosis present

## 2017-02-28 DIAGNOSIS — Z833 Family history of diabetes mellitus: Secondary | ICD-10-CM

## 2017-02-28 DIAGNOSIS — I132 Hypertensive heart and chronic kidney disease with heart failure and with stage 5 chronic kidney disease, or end stage renal disease: Principal | ICD-10-CM | POA: Diagnosis present

## 2017-02-28 DIAGNOSIS — R6884 Jaw pain: Secondary | ICD-10-CM | POA: Diagnosis present

## 2017-02-28 DIAGNOSIS — Z794 Long term (current) use of insulin: Secondary | ICD-10-CM

## 2017-02-28 DIAGNOSIS — I161 Hypertensive emergency: Secondary | ICD-10-CM | POA: Diagnosis present

## 2017-02-28 DIAGNOSIS — N2581 Secondary hyperparathyroidism of renal origin: Secondary | ICD-10-CM | POA: Diagnosis present

## 2017-02-28 DIAGNOSIS — N186 End stage renal disease: Secondary | ICD-10-CM | POA: Diagnosis present

## 2017-02-28 DIAGNOSIS — Z9119 Patient's noncompliance with other medical treatment and regimen: Secondary | ICD-10-CM

## 2017-02-28 DIAGNOSIS — Z6835 Body mass index (BMI) 35.0-35.9, adult: Secondary | ICD-10-CM

## 2017-02-28 DIAGNOSIS — Z9114 Patient's other noncompliance with medication regimen: Secondary | ICD-10-CM

## 2017-02-28 NOTE — ED Triage Notes (Signed)
Pt endorses L sided jaw pain present since yesterday. Also endorses bilateral leg swelling. Denies CP, SOB. Pt hypertensive in triage (200/141)

## 2017-03-01 ENCOUNTER — Inpatient Hospital Stay (HOSPITAL_COMMUNITY): Payer: Medicaid Other

## 2017-03-01 ENCOUNTER — Emergency Department (HOSPITAL_COMMUNITY): Payer: Medicaid Other

## 2017-03-01 ENCOUNTER — Other Ambulatory Visit (HOSPITAL_COMMUNITY): Payer: Medicaid Other

## 2017-03-01 ENCOUNTER — Encounter (HOSPITAL_COMMUNITY): Payer: Self-pay | Admitting: Internal Medicine

## 2017-03-01 ENCOUNTER — Other Ambulatory Visit: Payer: Self-pay

## 2017-03-01 ENCOUNTER — Inpatient Hospital Stay (HOSPITAL_COMMUNITY)
Admission: EM | Admit: 2017-03-01 | Discharge: 2017-03-05 | DRG: 291 | Disposition: A | Discharge status: Home / Self Care | Payer: Medicaid Other | Attending: Internal Medicine | Admitting: Internal Medicine

## 2017-03-01 DIAGNOSIS — R6884 Jaw pain: Secondary | ICD-10-CM | POA: Diagnosis present

## 2017-03-01 DIAGNOSIS — N189 Chronic kidney disease, unspecified: Secondary | ICD-10-CM

## 2017-03-01 DIAGNOSIS — Z79899 Other long term (current) drug therapy: Secondary | ICD-10-CM | POA: Diagnosis not present

## 2017-03-01 DIAGNOSIS — D631 Anemia in chronic kidney disease: Secondary | ICD-10-CM | POA: Diagnosis present

## 2017-03-01 DIAGNOSIS — E669 Obesity, unspecified: Secondary | ICD-10-CM | POA: Diagnosis present

## 2017-03-01 DIAGNOSIS — Z9114 Patient's other noncompliance with medication regimen: Secondary | ICD-10-CM | POA: Diagnosis not present

## 2017-03-01 DIAGNOSIS — I161 Hypertensive emergency: Secondary | ICD-10-CM | POA: Diagnosis present

## 2017-03-01 DIAGNOSIS — Z6835 Body mass index (BMI) 35.0-35.9, adult: Secondary | ICD-10-CM | POA: Diagnosis not present

## 2017-03-01 DIAGNOSIS — N2581 Secondary hyperparathyroidism of renal origin: Secondary | ICD-10-CM | POA: Diagnosis present

## 2017-03-01 DIAGNOSIS — Z8249 Family history of ischemic heart disease and other diseases of the circulatory system: Secondary | ICD-10-CM | POA: Diagnosis not present

## 2017-03-01 DIAGNOSIS — Z794 Long term (current) use of insulin: Secondary | ICD-10-CM | POA: Diagnosis not present

## 2017-03-01 DIAGNOSIS — Z9119 Patient's noncompliance with other medical treatment and regimen: Secondary | ICD-10-CM | POA: Diagnosis not present

## 2017-03-01 DIAGNOSIS — N179 Acute kidney failure, unspecified: Secondary | ICD-10-CM | POA: Diagnosis present

## 2017-03-01 DIAGNOSIS — I132 Hypertensive heart and chronic kidney disease with heart failure and with stage 5 chronic kidney disease, or end stage renal disease: Secondary | ICD-10-CM | POA: Diagnosis present

## 2017-03-01 DIAGNOSIS — E119 Type 2 diabetes mellitus without complications: Secondary | ICD-10-CM

## 2017-03-01 DIAGNOSIS — I509 Heart failure, unspecified: Secondary | ICD-10-CM

## 2017-03-01 DIAGNOSIS — N186 End stage renal disease: Secondary | ICD-10-CM | POA: Diagnosis present

## 2017-03-01 DIAGNOSIS — I5023 Acute on chronic systolic (congestive) heart failure: Secondary | ICD-10-CM | POA: Diagnosis present

## 2017-03-01 DIAGNOSIS — Z515 Encounter for palliative care: Secondary | ICD-10-CM | POA: Diagnosis present

## 2017-03-01 DIAGNOSIS — Z833 Family history of diabetes mellitus: Secondary | ICD-10-CM | POA: Diagnosis not present

## 2017-03-01 DIAGNOSIS — E872 Acidosis: Secondary | ICD-10-CM | POA: Diagnosis present

## 2017-03-01 DIAGNOSIS — E1122 Type 2 diabetes mellitus with diabetic chronic kidney disease: Secondary | ICD-10-CM | POA: Diagnosis present

## 2017-03-01 LAB — CBC
HCT: 23.3 % — ABNORMAL LOW (ref 39.0–52.0)
HEMATOCRIT: 22.9 % — AB (ref 39.0–52.0)
HEMOGLOBIN: 7.4 g/dL — AB (ref 13.0–17.0)
Hemoglobin: 7.6 g/dL — ABNORMAL LOW (ref 13.0–17.0)
MCH: 26.9 pg (ref 26.0–34.0)
MCH: 27.1 pg (ref 26.0–34.0)
MCHC: 32.3 g/dL (ref 30.0–36.0)
MCHC: 32.6 g/dL (ref 30.0–36.0)
MCV: 83.2 fL (ref 78.0–100.0)
MCV: 83.3 fL (ref 78.0–100.0)
Platelets: 175 10*3/uL (ref 150–400)
Platelets: 179 10*3/uL (ref 150–400)
RBC: 2.75 MIL/uL — ABNORMAL LOW (ref 4.22–5.81)
RBC: 2.8 MIL/uL — ABNORMAL LOW (ref 4.22–5.81)
RDW: 15.7 % — AB (ref 11.5–15.5)
RDW: 15.8 % — ABNORMAL HIGH (ref 11.5–15.5)
WBC: 4.4 10*3/uL (ref 4.0–10.5)
WBC: 4.7 10*3/uL (ref 4.0–10.5)

## 2017-03-01 LAB — SODIUM, URINE, RANDOM: SODIUM UR: 86 mmol/L

## 2017-03-01 LAB — PROTEIN / CREATININE RATIO, URINE
Creatinine, Urine: 86.79 mg/dL
Protein Creatinine Ratio: 1.86 mg/mg{Cre} — ABNORMAL HIGH (ref 0.00–0.15)
TOTAL PROTEIN, URINE: 161 mg/dL

## 2017-03-01 LAB — COMPREHENSIVE METABOLIC PANEL
ALK PHOS: 88 U/L (ref 38–126)
ALT: 11 U/L — AB (ref 17–63)
AST: 14 U/L — AB (ref 15–41)
Albumin: 3.3 g/dL — ABNORMAL LOW (ref 3.5–5.0)
Anion gap: 22 — ABNORMAL HIGH (ref 5–15)
BILIRUBIN TOTAL: 0.9 mg/dL (ref 0.3–1.2)
BUN: 158 mg/dL — AB (ref 6–20)
CO2: 16 mmol/L — ABNORMAL LOW (ref 22–32)
CREATININE: 31.44 mg/dL — AB (ref 0.61–1.24)
Calcium: 7.2 mg/dL — ABNORMAL LOW (ref 8.9–10.3)
Chloride: 99 mmol/L — ABNORMAL LOW (ref 101–111)
GFR calc Af Amer: 2 mL/min — ABNORMAL LOW (ref >60)
GFR, EST NON AFRICAN AMERICAN: 2 mL/min — AB (ref >60)
Glucose, Bld: 84 mg/dL (ref 65–99)
Potassium: 4.4 mmol/L (ref 3.5–5.1)
Sodium: 137 mmol/L (ref 135–145)
Total Protein: 7.3 g/dL (ref 6.5–8.1)

## 2017-03-01 LAB — BASIC METABOLIC PANEL
ANION GAP: 18 — AB (ref 5–15)
BUN: 159 mg/dL — AB (ref 6–20)
CO2: 17 mmol/L — ABNORMAL LOW (ref 22–32)
Calcium: 7 mg/dL — ABNORMAL LOW (ref 8.9–10.3)
Chloride: 99 mmol/L — ABNORMAL LOW (ref 101–111)
Creatinine, Ser: 31.23 mg/dL — ABNORMAL HIGH (ref 0.61–1.24)
GFR calc Af Amer: 2 mL/min — ABNORMAL LOW (ref >60)
GFR, EST NON AFRICAN AMERICAN: 2 mL/min — AB (ref >60)
Glucose, Bld: 106 mg/dL — ABNORMAL HIGH (ref 65–99)
POTASSIUM: 4.2 mmol/L (ref 3.5–5.1)
SODIUM: 134 mmol/L — AB (ref 135–145)

## 2017-03-01 LAB — TROPONIN I
TROPONIN I: 0.1 ng/mL — AB (ref <0.03)
Troponin I: 0.08 ng/mL (ref <0.03)
Troponin I: 0.09 ng/mL (ref <0.03)
Troponin I: 0.1 ng/mL (ref <0.03)

## 2017-03-01 LAB — URINALYSIS, ROUTINE W REFLEX MICROSCOPIC
BACTERIA UA: NONE SEEN
Bilirubin Urine: NEGATIVE
Glucose, UA: 50 mg/dL — AB
Ketones, ur: NEGATIVE mg/dL
Leukocytes, UA: NEGATIVE
Nitrite: NEGATIVE
Protein, ur: 100 mg/dL — AB
Specific Gravity, Urine: 1.01 (ref 1.005–1.030)
Squamous Epithelial / LPF: NONE SEEN
pH: 5 (ref 5.0–8.0)

## 2017-03-01 LAB — RETICULOCYTES
RBC.: 2.82 MIL/uL — ABNORMAL LOW (ref 4.22–5.81)
RETIC COUNT ABSOLUTE: 28.2 10*3/uL (ref 19.0–186.0)
RETIC CT PCT: 1 % (ref 0.4–3.1)

## 2017-03-01 LAB — BRAIN NATRIURETIC PEPTIDE: B NATRIURETIC PEPTIDE 5: 1812.7 pg/mL — AB (ref 0.0–100.0)

## 2017-03-01 LAB — IRON AND TIBC
IRON: 37 ug/dL — AB (ref 45–182)
Saturation Ratios: 13 % — ABNORMAL LOW (ref 17.9–39.5)
TIBC: 295 ug/dL (ref 250–450)
UIBC: 258 ug/dL

## 2017-03-01 LAB — I-STAT TROPONIN, ED: Troponin i, poc: 0.13 ng/mL (ref 0.00–0.08)

## 2017-03-01 LAB — MRSA PCR SCREENING: MRSA by PCR: NEGATIVE

## 2017-03-01 LAB — PHOSPHORUS: Phosphorus: 9.5 mg/dL — ABNORMAL HIGH (ref 2.5–4.6)

## 2017-03-01 LAB — GLUCOSE, CAPILLARY
Glucose-Capillary: 90 mg/dL (ref 65–99)
Glucose-Capillary: 122 mg/dL — ABNORMAL HIGH (ref 65–99)

## 2017-03-01 LAB — VITAMIN B12: Vitamin B-12: 860 pg/mL (ref 180–914)

## 2017-03-01 LAB — FERRITIN: Ferritin: 154 ng/mL (ref 24–336)

## 2017-03-01 LAB — FOLATE: Folate: 7.5 ng/mL (ref >5.9)

## 2017-03-01 LAB — HIV ANTIBODY (ROUTINE TESTING W REFLEX): HIV SCREEN 4TH GENERATION: NONREACTIVE

## 2017-03-01 MED ORDER — ASPIRIN EC 81 MG PO TBEC
81.0000 mg | DELAYED_RELEASE_TABLET | Freq: Every day | ORAL | Status: DC
  Administered 2017-03-01 – 2017-03-05 (×5): 81 mg via ORAL
  Filled 2017-03-01 (×5): qty 1

## 2017-03-01 MED ORDER — NITROGLYCERIN IN D5W 200-5 MCG/ML-% IV SOLN
10.0000 ug/min | INTRAVENOUS | Status: DC
  Administered 2017-03-01: 10 ug/min via INTRAVENOUS
  Filled 2017-03-01: qty 250

## 2017-03-01 MED ORDER — LABETALOL HCL 200 MG PO TABS
200.0000 mg | ORAL_TABLET | Freq: Two times a day (BID) | ORAL | Status: DC
  Administered 2017-03-01 – 2017-03-02 (×3): 200 mg via ORAL
  Filled 2017-03-01 (×3): qty 1

## 2017-03-01 MED ORDER — FUROSEMIDE 10 MG/ML IJ SOLN: 80.0000 mg | Freq: Two times a day (BID) | INTRAMUSCULAR | Status: DC

## 2017-03-01 MED ORDER — FUROSEMIDE 10 MG/ML IJ SOLN
20.0000 mg | Freq: Once | INTRAMUSCULAR | Status: AC
  Administered 2017-03-01: 20 mg via INTRAVENOUS
  Filled 2017-03-01: qty 2

## 2017-03-01 MED ORDER — SODIUM CHLORIDE 0.9% FLUSH
3.0000 mL | Freq: Two times a day (BID) | INTRAVENOUS | Status: DC
  Administered 2017-03-01 – 2017-03-04 (×5): 3 mL via INTRAVENOUS

## 2017-03-01 MED ORDER — FUROSEMIDE 10 MG/ML IJ SOLN
80.0000 mg | Freq: Four times a day (QID) | INTRAMUSCULAR | Status: DC
  Administered 2017-03-01 – 2017-03-02 (×3): 80 mg via INTRAVENOUS
  Filled 2017-03-01 (×4): qty 8

## 2017-03-01 MED ORDER — HEPARIN SODIUM (PORCINE) 5000 UNIT/ML IJ SOLN
5000.0000 [IU] | Freq: Three times a day (TID) | INTRAMUSCULAR | Status: DC
  Administered 2017-03-01 – 2017-03-04 (×12): 5000 [IU] via SUBCUTANEOUS
  Filled 2017-03-01 (×11): qty 1

## 2017-03-01 MED ORDER — INSULIN ASPART 100 UNIT/ML ~~LOC~~ SOLN: 0.0000 [IU] | Freq: Three times a day (TID) | SUBCUTANEOUS | Status: DC

## 2017-03-01 MED ORDER — SODIUM CHLORIDE 0.9% FLUSH
3.0000 mL | Freq: Two times a day (BID) | INTRAVENOUS | Status: DC
  Administered 2017-03-02 – 2017-03-04 (×4): 3 mL via INTRAVENOUS

## 2017-03-01 MED ORDER — FUROSEMIDE 10 MG/ML IJ SOLN
80.0000 mg | Freq: Two times a day (BID) | INTRAMUSCULAR | Status: DC
  Administered 2017-03-01: 80 mg via INTRAVENOUS
  Filled 2017-03-01: qty 8

## 2017-03-01 MED ORDER — ASPIRIN 81 MG PO CHEW
324.0000 mg | CHEWABLE_TABLET | Freq: Once | ORAL | Status: AC
  Administered 2017-03-01: 324 mg via ORAL
  Filled 2017-03-01: qty 4

## 2017-03-01 MED ORDER — SODIUM CHLORIDE 0.9 % IV SOLN: 250.0000 mL | INTRAVENOUS | Status: DC | PRN

## 2017-03-01 MED ORDER — KIDNEY FAILURE BOOK
Freq: Once | Status: AC
  Administered 2017-03-01: 22:00:00
  Filled 2017-03-01: qty 1

## 2017-03-01 MED ORDER — AMLODIPINE BESYLATE 10 MG PO TABS
10.0000 mg | ORAL_TABLET | Freq: Every day | ORAL | Status: DC
  Administered 2017-03-01 – 2017-03-04 (×4): 10 mg via ORAL
  Filled 2017-03-01 (×4): qty 1

## 2017-03-01 MED ORDER — SODIUM CHLORIDE 0.9% FLUSH: 3.0000 mL | INTRAVENOUS | Status: DC | PRN

## 2017-03-01 MED ORDER — LIVING WELL WITH DIABETES BOOK
Freq: Once | Status: AC
  Administered 2017-03-01: 22:00:00
  Filled 2017-03-01: qty 1

## 2017-03-01 NOTE — ED Notes (Signed)
Bladder scanned pt, 134 mL

## 2017-03-01 NOTE — H&P (Signed)
Date: 03/01/2017               Patient Name:  Patrick Ibarra MRN: [id number]  DOB: [birth year] Age / Sex: 36 y.o., male   PCP: System, Pcp Not In         Medical Service: Internal Medicine Teaching Service         Attending Physician: Dr. Lynnae January, Real Cons, MD    First Contact: Dr. Danford Bad Pager: [phone]  Second Contact: Dr. Benjamine Mola Pager: [phone]       After Hours (After 5p/  First Contact Pager: [phone]  weekends / holidays): Second Contact Pager: [phone]   Chief Complaint: Jaw pain and leg swelling  History of Present Illness:  Patrick Ibarra is a very pleasant 36 year old male with a past medical history significant for hypertension, chronic kidney disease, heart failure with reduced ejection fracture, diabetes mellitus type 2, and obesity presenting to the emergency department tonight with complaints of jaw pain and bilateral lower extremity edema. He reports that over the past 2-3 days he has developed left sided jaw pain. He described the pain as a throbbing sensation that is worse with mastication. He has not tried anything for his jaw pain. He also reports noticing his legs have been swollen for the same time period and endorses some shortness of breath when exerting himself. He states that over the past 2-3 months he has had progressive difficulty with exertion. Notes that walking on flat ground is no issue at a normal pace but becomes short of breath when exerting himself past his normal pace. Notes significant dyspnea when walking up an incline. He denies any chest pain, palpitations, diaphoresis, nausea, vomiting, abdominal pain, urinary symptoms, hematuria, melena, hematochezia. He reports he has not been on any medications for the past 2-3 years.   In the ED his vitals were noted as hypertensive at 204/148, tachycardia at 115, respiratory rate 18, afebrile at 98.7, and satting well 100% on room air. BMET was notable for acute renal failure with creatinine of 31 (GFR 2), BUN 159. No  hyperkalemia with K of 4.2. Bicarb of 17. CBC notable for hemoglobin of 7.4. No evidence of active bleeding. Initial troponin elevated at 0.10. EKG with sinus tachycardia and no acute ischemic changes. Bladder scan at bedside showed 134 mL of urine. EPD discussed with nephrology, no immediate need for dialysis. IMTS paged for admission.   Meds:  No outpatient prescriptions have been marked as taking for the 03/01/17 encounter [facility] Encounter).    Allergies: Allergies as of 02/28/2017  . (No Known Allergies)   Past Medical History:  Diagnosis Date  . Hypertension   . LV dysfunction   . Obesity   . Uncontrolled diabetes mellitus ([place])    Past Surgical History:  Procedure Laterality Date  . TONSILLECTOMY AND ADENOIDECTOMY     Family History:  Family History  Problem Relation Age of Onset  . Hypertension Mother   . Diabetes Mother   . Hypertension Father    Social History:  Social History   Social History  . Marital status: Married    Spouse name: N/A  . Number of children: N/A  . Years of education: N/A   Social History Main Topics  . Smoking status: Never Smoker  . Smokeless tobacco: Never Used  . Alcohol use Yes     Comment: WINE ONCE A WEEK  . Drug use: No  . Sexual activity: Not Asked   Other Topics Concern  .  None   Social History Narrative   He is married with step kids. He works at [employer].     Review of Systems: A complete ROS was negative except as per HPI.   Physical Exam: Blood pressure (!) 197/132, pulse 100, temperature 98.7 F (37.1 C), temperature source Oral, resp. rate 11, height 6\' 1"  (1.854 m), SpO2 97 %.  Physical Exam  Constitutional: He is oriented to person, place, and time and well-developed, well-nourished, and in no distress. No distress.  HENT:  Head: Normocephalic and atraumatic.  Mouth/Throat: Oropharynx is clear and moist and mucous membranes are normal. No oral lesions. No trismus in the jaw. No dental abscesses.    Eyes: EOM are normal. Pupils are equal, round, and reactive to light.  Neck: JVD present.  Cardiovascular: Regular rhythm, S1 normal and S2 normal.  Tachycardia present.  Exam reveals no gallop and no friction rub.   No murmur heard. Pulmonary/Chest: Effort normal and breath sounds normal. No respiratory distress. He has no wheezes. He has no rales.  Abdominal: Soft. Bowel sounds are normal. He exhibits no distension. There is no tenderness.  Musculoskeletal: Normal range of motion.  2+ pitting edema to knee bilaterally  Neurological: He is alert and oriented to person, place, and time.  Skin: Skin is warm and dry. He is not diaphoretic.  Psychiatric: Mood and affect normal.  Vitals reviewed.  EKG: personally reviewed my interpretation is sinus tachycardia, no ischemic changes. Unchanged from prior tracing.   CXR: personally reviewed my interpretation is enlarged cardiac silhouetted. No evidence of pleural effusion or consolidation.   Assessment & Plan by Problem:  Hypertensive emergency: Patient presents with hypertensive emergency. Maximal MAP on arrival was 167 with BP 204/148. Evidence of end organ damage with creatinine of 31 and troponin 0.13. Patient has normal mentation and is AA&O x 3. It appears he has been admitted in the past (2013 and 2015) with hypertensive emergency as well. Has been non-compliant with anti-hypertensive therapies and has been lost to follow up since 12/2013 follow up with cardiology after most recent admission 12/2013.  -Initiate nitroglycerin gtt with goal MAP 145-150 (10% reduction) in the first hour, then slow titration over the next 23 hours with goal MAP 125-130. Discussed with nursing staff in ED. -Serial troponin until peak -Repeat EKG in am -ECHO tomorrow -CMET in am (no LFTs checked, possibly hepatic injury as well) -Admit to SDU  Acute on Chronic Renal Failure: Cr on admission was 31 with GFR of 2. Appears to have chronic kidney disease secondary  uncontrolled htn and DM. Likely Stage IV CKD based on labs from prior admissions. Most recent labs from 12/2013 follow up with Cr 3.16 and GFR 29. He reports good urine output and denies any urinary symptoms. Bladder scan in the ED with 134 mL urine present. EDP discussed with on-call nephrologist. No urgent need for dialysis, will consult in the morning. Patient does appear to be grossly volume overloaded on exam. Most likely end-organ damage from hypertensive emergency though some component of volume overload may be present. The possibility of dialysis was discussed with patient. -Blood pressure control per above -Renal Ultrasound -Will give Lasix 20 mg IV x 1 dose as lasix naive  -CMET, phos in am -Urine studies, UA, UNa, Urine protein/cr ratio -Renal to see in am, appreciate assistance   Elevated Troponin: Troponin 0.13 on arrival. Denies any chest pain. Likely due to hypertensive emergency. No ischemic changes on his EKG. Will trend trops and repeat  EKG in am.   Heart Failure with Reduced EF, Volume Overload: Patient grossly volume overloaded on exam. 2+ pitting edema bilaterally with +JVD. Lung clear to ascultation and CXR clear. Prior ECHO 12/2013 with reduced EF 45-50%, global hypokinesis. Complaints of dyspnea with exertion. Has been non-compliant with all medications; no follow up ECHO. Suspect progression of his previous heart failure. Do not see any ischemic evaluation done previously.  -Check BNP -Repeat ECHO -Lasix 20 mg IV x 1 dose, monitor response -Daily weights, Strict I&O -Consult Cardiology in am  Anemia:  Hgb 7.4 on arrival. No history of anemia. No signs of active bleeding. Denies any melena, hematochezia, hematuria, hematemesis. Previous Hgb in 2015 was 13-15. MCV 83.3, RDW 15.7. May be some component of anemia of chronic disease secondary to his CKD and hemodilution from volume overload though do not believe this would explain such a dramatic drop in Hgb. No symptotic anemia  currently; no chest pain, SOB (at rest or minimal exertion), etc. Will repeat CBC, transfuse Hbg<7 or symptomatic. -repeat CBC -Check ferritin   Jaw Pain: Does not sound cardiac in nature. Does have poor dentition. Pain is throbbing and worst with mastication. Reports this pain has resolved since arrival to the ED. No obvious infection or abscess present. Will monitor. Likely needs dental follow up after discharge.   DM Type 2: Reports being on insulin previously but not recently. A1c from 12/2013 was 7.5 (16.6 in 2013). BMET on arrival with glucose of 106.  -SSI -CBG qac, qhs -Check A1c  Social: Patient with no insurance. Unable to afford medications. No PCP. Will consult CM for medication assistance. Patient is agreeable to follow up with [facility] after discharge.   Diet: Renal, fluid restrict DVT: Heparin SQ Code Status: FULL  Dispo: Admit patient to Inpatient with expected length of stay greater than 2 midnights.  Signed: Maryellen Pile, MD 03/01/2017, 3:37 AM  Pager: [phone]

## 2017-03-01 NOTE — Consult Note (Signed)
Renal Service Consult Note [place] 03/01/2017 [place] Requesting Physician: Dr Lynnae January  Reason for Consult:  Severe renal failure HPI: The patient is a 36 y.o. year-old with hx of HTN, DM (on insulin short period, now off), CKD IV , lost to f/u and hasn't taken BP medications for a year. Has not been seeing doctors for about 3 yrs. Presented to ED with LE swelling and creatinine was > 30.  Pt admitted.  Asked to see for worsening renal failure.   Pt says he was seeing a [facility] doctor back in 2015, but that's the last time he did.  Doesn't remember their name or location.  Grew up in [place], graduated [employer] and did one year college in [place], went to work at [employer].  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.  Actively working.  No tob, rare etoh.  Married and has 2 step children.    Main symptoms and am nausea and leg swelling, also +new DOE w/ walking faster than usual.  No jerking or vomiting.  No confusion.   Renal US shows severely atrophic kidneys w/ loss of CM differentiation and no hydro. UA is bland   ROS  denies CP  no joint pain   no HA  no blurry vision  no rash  no diarrhea  no nausea/ vomiting  no dysuria  no difficulty voiding  no change in urine color    Past Medical History  Past Medical History:  Diagnosis Date  . Hypertension   . LV dysfunction   . Obesity   . Uncontrolled diabetes mellitus [facility])    Past Surgical History  Past Surgical History:  Procedure Laterality Date  . TONSILLECTOMY AND ADENOIDECTOMY     Family History  Family History  Problem Relation Age of Onset  . Hypertension Mother   . Diabetes Mother   . Hypertension Father    Social History  reports that he has never smoked. He has never used smokeless tobacco. He reports that he drinks alcohol. He reports that he does not use drugs. Allergies No Known Allergies Home medications Prior to Admission medications    Medication Sig Start Date End Date Taking? Authorizing Provider  amLODipine (NORVASC) 10 MG tablet Take 1 tablet (10 mg total) by mouth daily. Patient not taking: Reported on 03/01/2017 07/24/15   Ward, Ozella Almond, PA-C  Blood Glucose Monitoring Suppl (ACCU-CHEK AVIVA PLUS) W/DEVICE KIT 1 applicator by Does not apply route 4 (four) times daily. 01/05/14   Wall, Marijo Conception, MD  captopril (CAPOTEN) 25 MG tablet Take 1 tablet (25 mg total) by mouth 3 (three) times daily. Patient not taking: Reported on 07/24/2015 12/24/13   Hosie Poisson, MD  carvedilol (COREG) 6.25 MG tablet Take 1 tablet (6.25 mg total) by mouth 2 (two) times daily with a meal. Patient not taking: Reported on 07/24/2015 12/24/13   Hosie Poisson, MD  furosemide (LASIX) 20 MG tablet Take 1 tablet (20 mg total) by mouth daily. Patient not taking: Reported on 07/24/2015 12/24/13   Hosie Poisson, MD  glucose blood (ACCU-CHEK AVIVA) test strip Use as instructed 01/05/14   Wall, Marijo Conception, MD  hydrALAZINE (APRESOLINE) 25 MG tablet Take 1 tablet (25 mg total) by mouth every 8 (eight) hours. Patient not taking: Reported on 07/24/2015 12/24/13   Hosie Poisson, MD  insulin aspart (NOVOLOG) 100 UNIT/ML injection For CBG: 70-120, No Insulin; CBG: 121-150, 2 units; CBG 151-200, 3 units; CBG: 201-250,  5 units.; CBG 251-300, 8 units; CBG: 301-350, 11 units; CBG: 351-400, 15 units. Patient not taking: Reported on 07/24/2015 12/27/11   Monika Salk, MD  insulin glargine (LANTUS) 100 UNIT/ML injection Inject 15 Units into the skin 2 (two) times daily. Patient not taking: Reported on 03/01/2017 12/27/11 03/01/17  Monika Salk, MD  insulin glargine (LANTUS) 100 UNIT/ML injection Inject 0.15 mLs (15 Units total) into the skin at bedtime. Patient not taking: Reported on 03/01/2017 12/24/13   Hosie Poisson, MD  Insulin Syringes, Disposable, U-100 0.3 ML MISC 1 applicator by Does not apply route 2 (two) times daily. 01/05/14   Wall, Marijo Conception, MD  Lancet Devices  [facility]) lancets Use as instructed 01/05/14   Wall, Marijo Conception, MD  naproxen (NAPROSYN) 500 MG tablet Take 1 tablet (500 mg total) by mouth 2 (two) times daily. Patient not taking: Reported on 03/01/2017 07/24/15   Ward, Ozella Almond, PA-C   Liver Function Tests  Recent Labs Lab 03/01/17 0352  AST 14*  ALT 11*  ALKPHOS 88  BILITOT 0.9  PROT 7.3  ALBUMIN 3.3*   No results for input(s): LIPASE, AMYLASE in the last 168 hours. CBC  Recent Labs Lab 02/28/17 2351 03/01/17 0352  WBC 4.7 4.4  HGB 7.4* 7.6*  HCT 22.9* 23.3*  MCV 83.3 83.2  PLT 179 929   Basic Metabolic Panel  Recent Labs Lab 02/28/17 2351 03/01/17 0352  NA 134* 137  K 4.2 4.4  CL 99* 99*  CO2 17* 16*  GLUCOSE 106* 84  BUN 159* 158*  CREATININE 31.23* 31.44*  CALCIUM 7.0* 7.2*  PHOS  --  9.5*   Iron/TIBC/Ferritin/ %Sat    Component Value Date/Time   IRON 37 (L) 03/01/2017 0752   TIBC 295 03/01/2017 0752   FERRITIN 154 03/01/2017 0422   IRONPCTSAT 13 (L) 03/01/2017 0752    Vitals:   03/01/17 1030 03/01/17 1100 03/01/17 1130 03/01/17 1200  BP: (!) 190/145 (!) 199/144 (!) 190/141 (!) 187/139  Pulse: 97 96 100 (!) 105  Resp: (!) _0 (!) 9  Temp:      TempSrc:      SpO2: 98% 100% 98% 97%  Weight:      Height:       Exam Gen alert, WDWN AAM, no distress No rash, cyanosis or gangrene Sclera anicteric, throat clear  No jvd or bruits Chest dec'd at the bases, no rales or wheezing, o/w clear RRR no MRG Abd soft ntnd no mass or ascites +bs GU normal male MS no joint effusions or deformity Ext 1-2+ pitting bilat pretib edema / no wounds or ulcers Neuro is alert, Ox 3 , nf, no asterixis  CXR clear  UA > clear, 0-5 rbc/ wbc, 100 protein Renal US 6/29 > marked echogenicity, 8-9 cm kidneys w/ loss of CM differentiation  Old creats: Mar 2013 > 1.8- 2.01 December 2013 > 3.5- 3.07 Feb 2017 > 31  Assess: 1  Advanced CKD , stage V now with mild uremic symptoms. No acute reversible  causes notes. Vol overloaded w/o pulm edema. Most likely we need to start dialysis but patient not ready to talk about dialysis.  Will observe another 24 hrs, watch creat, etc.  Give lasix for vol overload. Will f/u in am.  2  Vol overload - mild / mod 3  HTN crisis give IV lasix and will add labetalol for now 4  Hx DM - off insulin   Plan - as above  Kelly Splinter MD [employer] pager [phone]   03/01/2017, 12:24 PM

## 2017-03-01 NOTE — ED Notes (Signed)
Per Dr. Charlynn Grimes if MAP drops below 130 and stays below 130 stop nitro gtt and monitor MAP.

## 2017-03-01 NOTE — Progress Notes (Signed)
Subjective: Seen and evaluated to day at bedside. No new complaints. Legs feel a little less tight than on admission. Breathing OK. Doesn't know if he has history of diabetes or renal disease.   Objective:  Vital signs in last 24 hours: Vitals:   03/01/17 0930 03/01/17 1000 03/01/17 1030 03/01/17 1100  BP: (!) 170/117 (!) 181/125 (!) 190/145 (!) 199/144  Pulse: 94 96 97 96  Resp: 14 16 (!) 23 10  Temp:      TempSrc:      SpO2: 97% 100% 98% 100%  Weight:      Height:       General: Obese AA male resting comfortably in bed. NAD. Pleasant and conversant.  HENT: No conjunctival injection, icterus or ptosis. Oropharynx clear, mucous membranes moist.  Cardiovascular: Tachycardic but regular. No murmur or rub appreciated. Pulmonary: CTA BL. No wheezes or crackles appreciated. Unlabored breathing.  Abdomen: Soft, non-tender. No guarding or rigidity. +bowel sounds.  Extremities: 2+ pitting edema to BL knees. No gross deformities. Skin: Warm, dry. No cyanosis.  Psych: Mood normal and affect was mood congruent. Responds to questions appropriately although perhaps a tad slow   Assessment/Plan:  Principal Problem:   Hypertensive emergency Active Problems:   Diabetes mellitus ([place])   Renal failure ([facility]), acute on chronic (HCC)   CHF (congestive heart failure) (HCC)   Obesity   Anemia  #Hypertensive Emergency #Elevated Troponin BP max so far 204/148. BP now 199/144. He is on nitro drip with goal MAP today of 125-130. Troponin 0.10, likely due to demand from his volume overloaded state with poor renal clearance. EKG without ST segment changes and he denies any CP or SOB. -Start Amlodipine 10 mg daily. He will likely require multiple therapies for adequate BP control -Continue nitro ggt with MAP goal today of 125-130 -ASA 81 mg daily -Pt/OT -Remain in SDU for close monitoring  #Acute on Chronic Renal Failure Renal US with atrophy and markedly echogenic renal parenchyma consistent  with medical renal disease. No hydronephrosis was appreciated. Cr 31 and BUN 158.  Patient a little slow to respond to questions this morning however otherwise mental status intact. Suspect this is a chronic issue for the patient. Chart review shows elevated Cr dating back to 2013. His potassium is stable however he is volume-overloaded. We have asked Nephrology to evaluate the patient and we greatly appreciate their assistance.  -FU nephrology recs -Follow cr in setting of attempted diuresis   #Acute on Chronic Systolic Congestive Heart Failure Prior ECHO 12/2013 with LVEF of 45-50% and diffuse hypokinesis. He was not taking any medications at home. Pt markedly volume-up on exam and BNP 1300. Lungs clear and he is breathing comfortably. Suspect evolution of his chronic heart failure exacerbated by volume overload secondary to his advanced renal failure.  -Doesn't appear to have had an ischemic work-up -ECHO -IV Lasix 80mg  BID. Monitor for response and increase as needed -Strict I&O  -Daily weights  #Type 2 Diabetes He was previously on insulin however has been off of all medications for several years. Last A1c was in 12/2013 which was 7.5. Was previously 109 in 2013.  -SSI-S -FU A1c  #Anemia Hb 7.4 on admission and he denies any history of bleeding. Ferritin 154, Iron 37 and iron saturation ratio 13. Iron deficiency vs anemia of chronic disease?  -Consider iron supplementation -Consider Epo if indicated by nephro  Dispo: Anticipated discharge once volume status better controlled. Have consulted CM to help with PCP and medication assistance.  Donald Jacque, DO 03/01/2017, 11:27 AM Pager: [phone]

## 2017-03-01 NOTE — Evaluation (Signed)
Physical Therapy Evaluation Patient Details Name: Patrick Ibarra MRN: [id number] DOB: [birth year] Today's Date: 03/01/2017   History of Present Illness  Pt is a 36yo male admitted through the ED with c/o jaw pain, swelling in LE's and elevated BP. Pt had mildly elevated troponin which was related to AKI and continues to have elevated BP througout hospital stay. PMH significant for HTN, DM2, ARF on chronic, CHF, obesity, anemia.   Clinical Impression  Pt presents with the above diagnosis and below deficits for therapy evaluation. Prior to admission, pt lived with his wife in a single level home and was working full time. Pt developed the above symptoms about two days ago and has been having increased weakness. Pt is able to maneuver with Mod I to independence throughout evaluation including bed mobility, transfers and gait. Pt's BP is elevated prior to treatment, RN okay with continuing with evaluation and monitoring. Pt is at his prior level of function and does not need any further PT follow-up at this time. PT will sign off.     Follow Up Recommendations No PT follow up    Equipment Recommendations  None recommended by PT    Recommendations for Other Services       Precautions / Restrictions Precautions Precautions: None Restrictions Weight Bearing Restrictions: No      Mobility  Bed Mobility Overal bed mobility: Independent             General bed mobility comments: able to get EOB without any use of railings or hands on assistance  Transfers Overall transfer level: Independent               General transfer comment: able to stand from EOB with no assistance or LOB  Ambulation/Gait Ambulation/Gait assistance: Modified independent (Device/Increase time) Ambulation Distance (Feet): 30 Feet Assistive device: None Gait Pattern/deviations: Step-through pattern Gait velocity: decreased Gait velocity interpretation: Below normal speed for age/gender General Gait  Details: good seqeuncing, no LOB, slow cadence and required assistance with equipment.   Stairs            Wheelchair Mobility    Modified Rankin (Stroke Patients Only)       Balance Overall balance assessment: Independent                                           Pertinent Vitals/Pain Pain Assessment: No/denies pain    Home Living Family/patient expects to be discharged to:: Private residence Living Arrangements: Spouse/significant other Available Help at Discharge: Family;Available PRN/intermittently Type of Home: House Home Access: Stairs to enter Entrance Stairs-Rails: Psychiatric nurse of Steps: 2- Home Layout: One level Home Equipment: None      Prior Function Level of Independence: Independent         Comments: completely independent, working, driving      Hand Dominance   Dominant Hand: Right    Extremity/Trunk Assessment   Upper Extremity Assessment Upper Extremity Assessment: Overall WFL for tasks assessed    Lower Extremity Assessment Lower Extremity Assessment: Overall WFL for tasks assessed    Cervical / Trunk Assessment Cervical / Trunk Assessment: Normal  Communication   Communication: No difficulties  Cognition Arousal/Alertness: Awake/alert Behavior During Therapy: WFL for tasks assessed/performed Overall Cognitive Status: Within Functional Limits for tasks assessed  General Comments      Exercises     Assessment/Plan    PT Assessment Patent does not need any further PT services  PT Problem List         PT Treatment Interventions      PT Goals (Current goals can be found in the Care Plan section)  Acute Rehab PT Goals Patient Stated Goal: to get back home PT Goal Formulation: All assessment and education complete, DC therapy    Frequency     Barriers to discharge        Co-evaluation               AM-PAC PT "6  Clicks" Daily Activity  Outcome Measure Difficulty turning over in bed (including adjusting bedclothes, sheets and blankets)?: None Difficulty moving from lying on back to sitting on the side of the bed? : None Difficulty sitting down on and standing up from a chair with arms (e.g., wheelchair, bedside commode, etc,.)?: None Help needed moving to and from a bed to chair (including a wheelchair)?: None Help needed walking in hospital room?: None Help needed climbing 3-5 steps with a railing? : None 6 Click Score: 24    End of Session Equipment Utilized During Treatment: Gait belt Activity Tolerance: Patient tolerated treatment well Patient left: in bed;with call bell/phone within reach Nurse Communication: Mobility status PT Visit Diagnosis: Difficulty in walking, not elsewhere classified (R26.2)    Time: 1856-3149 PT Time Calculation (min) (ACUTE ONLY): 17 min   Charges:   PT Evaluation $PT Eval Moderate Complexity: 1 Procedure     PT G Codes:   PT G-Codes **NOT FOR INPATIENT CLASS** Functional Assessment Tool Used: AM-PAC 6 Clicks Basic Mobility;Clinical judgement Functional Limitation: Mobility: Walking and moving around Mobility: Walking and Moving Around Current Status (F0263): 0 percent impaired, limited or restricted Mobility: Walking and Moving Around Goal Status (Z8588): 0 percent impaired, limited or restricted Mobility: Walking and Moving Around Discharge Status (F0277): 0 percent impaired, limited or restricted    Scheryl Marten PT, DPT  [phone]   Patrick Ibarra 03/01/2017, 1:38 PM

## 2017-03-01 NOTE — ED Provider Notes (Signed)
[facility] DEPT Provider Note   CSN: [id number] Arrival date & time: 02/28/17  2336   By signing my name below, I, Soijett Blue, attest that this documentation has been prepared under the direction and in the presence of Ripley Fraise, MD. Electronically Signed: Soijett Blue, ED Scribe. 03/01/17. 12:55 AM.  History   Chief Complaint Chief Complaint  Patient presents with  . Jaw Pain    HPI BURECH MCFARLAND is a 36 y.o. male with a PMHx of HTN, DM, who presents to the Emergency Department complaining of left sided jaw pain onset yesterday. Pt reports associated bilateral leg swelling x 2 days, exertional SOB with brisk pace x 2-3 days, and recent weight gain. Pt has not tried any medications for the relief of his symptoms. He reports that he noticed his left sided jaw pain was exacerbated while in the [facility] with no known alleviating factors. He denies neck pain, dental pain, CP, abdominal pain, back pain, difficulty urinating, and any other symptoms. Denies taking daily medications or PMHx of dialysis, MI, or stroke.    The history is provided by the patient. No language interpreter was used.    Past Medical History:  Diagnosis Date  . Hypertension   . LV dysfunction   . Obesity   . Uncontrolled diabetes mellitus [facility])     Patient Active Problem List   Diagnosis Date Noted  . Essential hypertension 01/05/2014  . Wheezing 12/21/2013  . Renal failure (ARF), acute on chronic (HCC) 12/20/2013  . Diabetes mellitus ([place]) 12/24/2011  . Chronic renal insufficiency, stage IV (severe) ([employer]) 12/24/2011    Past Surgical History:  Procedure Laterality Date  . TONSILLECTOMY AND ADENOIDECTOMY         Home Medications    Prior to Admission medications   Medication Sig Start Date End Date Taking? Authorizing Provider  acetaminophen (TYLENOL) 500 MG tablet Take 1,000 mg by mouth every 6 (six) hours as needed for moderate pain.    [provider]  amLODipine (NORVASC) 10 MG  tablet Take 1 tablet (10 mg total) by mouth daily. 07/24/15   Ward, Ozella Almond, PA-C  Blood Glucose Monitoring Suppl (ACCU-CHEK AVIVA PLUS) W/DEVICE KIT 1 applicator by Does not apply route 4 (four) times daily. 01/05/14   Wall, Marijo Conception, MD  captopril (CAPOTEN) 25 MG tablet Take 1 tablet (25 mg total) by mouth 3 (three) times daily. Patient not taking: Reported on 07/24/2015 12/24/13   Hosie Poisson, MD  carvedilol (COREG) 6.25 MG tablet Take 1 tablet (6.25 mg total) by mouth 2 (two) times daily with a meal. Patient not taking: Reported on 07/24/2015 12/24/13   Hosie Poisson, MD  furosemide (LASIX) 20 MG tablet Take 1 tablet (20 mg total) by mouth daily. Patient not taking: Reported on 07/24/2015 12/24/13   Hosie Poisson, MD  glucose blood (ACCU-CHEK AVIVA) test strip Use as instructed Patient not taking: Reported on 07/24/2015 01/05/14   Wall, Marijo Conception, MD  hydrALAZINE (APRESOLINE) 25 MG tablet Take 1 tablet (25 mg total) by mouth every 8 (eight) hours. Patient not taking: Reported on 07/24/2015 12/24/13   Hosie Poisson, MD  ibuprofen (ADVIL,MOTRIN) 200 MG tablet Take 400 mg by mouth every 6 (six) hours as needed for moderate pain.    [provider]  insulin aspart (NOVOLOG) 100 UNIT/ML injection For CBG: 70-120, No Insulin; CBG: 121-150, 2 units; CBG 151-200, 3 units; CBG: 201-250, 5 units.; CBG 251-300, 8 units; CBG: 301-350, 11 units; CBG: 351-400, 15 units. Patient not  taking: Reported on 07/24/2015 12/27/11   Monika Salk, MD  insulin glargine (LANTUS) 100 UNIT/ML injection Inject 15 Units into the skin 2 (two) times daily. Patient not taking: Reported on 07/24/2015 12/27/11 07/24/15  Monika Salk, MD  insulin glargine (LANTUS) 100 UNIT/ML injection Inject 0.15 mLs (15 Units total) into the skin at bedtime. 12/24/13   Hosie Poisson, MD  Insulin Syringes, Disposable, U-100 0.3 ML MISC 1 applicator by Does not apply route 2 (two) times daily. 01/05/14   Wall, Marijo Conception, MD  Lancet Devices  [facility]) lancets Use as instructed 01/05/14   Wall, Marijo Conception, MD  naproxen (NAPROSYN) 500 MG tablet Take 1 tablet (500 mg total) by mouth 2 (two) times daily. 07/24/15   Ward, Ozella Almond, PA-C    Family History Family History  Problem Relation Age of Onset  . Hypertension Mother   . Diabetes Mother   . Hypertension Father     Social History Social History  Substance Use Topics  . Smoking status: Never Smoker  . Smokeless tobacco: Not on file  . Alcohol use Yes     Comment: WINE ONCE A WEEK     Allergies   Patient has no known allergies.   Review of Systems Review of Systems  Constitutional: Positive for unexpected weight change.  HENT:       +left sided jaw pain  Respiratory: Positive for shortness of breath.   Cardiovascular: Positive for leg swelling (BLE). Negative for chest pain.  Gastrointestinal: Negative for abdominal pain.  Genitourinary: Negative for difficulty urinating.  Musculoskeletal: Negative for back pain and neck pain.  All other systems reviewed and are negative.    Physical Exam Updated Vital Signs BP (!) 200/141 (BP Location: Left Arm)   Pulse (!) 115   Temp 98.7 F (37.1 C) (Oral)   Resp 18   Ht _0  (1.854 m)   SpO2 99%   Physical Exam CONSTITUTIONAL: Well developed/well nourished HEAD: Normocephalic/atraumatic EYES: EOMI/PERRL ENMT: Mucous membranes moist, no dental abscess, no trismus, no facial swelling NECK: supple no meningeal signs. JVD noted  SPINE/BACK:entire spine nontender CV: S1/S2 noted, no murmurs/rubs/gallops noted. Tachycardic.  LUNGS: Lungs are clear to auscultation bilaterally, no apparent distress ABDOMEN: soft, nontender, no rebound or guarding, bowel sounds noted throughout abdomen GU:no cva tenderness NEURO: Pt is awake/alert/appropriate, moves all extremitiesx4.  No facial droop.  No arm or leg weakness noted.  EXTREMITIES: pulses normal/equal, full ROM. 2+ pitting edema to BLE SKIN: warm, color  normal PSYCH: no abnormalities of mood noted, alert and oriented to situation   ED Treatments / Results  DIAGNOSTIC STUDIES: Oxygen Saturation is 99% on RA, nl by my interpretation.    COORDINATION OF CARE: 12:54 AM Discussed treatment plan with pt at bedside which includes labs, CXR, EKG, and pt agreed to plan.   1:06 AM- Consult with nephrologist, Dr. Florene Glen who recommends admission to medicine and evaluation in the morning.    Labs (all labs ordered are listed, but only abnormal results are displayed) Labs Reviewed  BASIC METABOLIC PANEL - Abnormal; Notable for the following:       Result Value   Sodium 134 (*)    Chloride 99 (*)    CO2 17 (*)    Glucose, Bld 106 (*)    BUN 159 (*)    Creatinine, Ser 31.23 (*)    Calcium 7.0 (*)    GFR calc non Af Amer 2 (*)    GFR calc Af Wyvonnia Lora  2 (*)    Anion gap 18 (*)    All other components within normal limits  CBC - Abnormal; Notable for the following:    RBC 2.75 (*)    Hemoglobin 7.4 (*)    HCT 22.9 (*)    RDW 15.7 (*)    All other components within normal limits  TROPONIN I - Abnormal; Notable for the following:    Troponin I 0.10 (*)    All other components within normal limits  I-STAT TROPOININ, ED - Abnormal; Notable for the following:    Troponin i, poc 0.13 (*)    All other components within normal limits    EKG  EKG Interpretation  Date/Time:  Friday February 28 2017 23:53:48 EDT Ventricular Rate:  112 PR Interval:  168 QRS Duration: 84 QT Interval:  360 QTC Calculation: 491 R Axis:   43 Text Interpretation:  Sinus tachycardia Possible Left atrial enlargement Nonspecific T wave abnormality Abnormal ECG No significant change since last tracing Confirmed by Ripley Fraise [phone]) on 03/01/2017 12:33:27 AM       Radiology Dg Chest 2 View  Result Date: 03/01/2017 CLINICAL DATA:  Left-sided jaw pain since yesterday. Bilateral lower extremity swelling. Hypertension. EXAM: CHEST  2 VIEW COMPARISON:  12/20/2013  FINDINGS: Mildly enlarged cardiac silhouette, new from 12/20/2013. The lungs are clear. There is no pleural effusion. The pulmonary vasculature is normal. Hilar and mediastinal contours are unremarkable and unchanged. IMPRESSION: New mildly enlarged cardiac silhouette. No consolidation or effusion. Electronically Signed   By: Andreas Newport M.D.   On: 03/01/2017 00:48    Procedures Procedures  CRITICAL CARE Performed by: Sharyon Cable Total critical care time: 33 minutes Critical care time was exclusive of separately billable procedures and treating other patients. Critical care was necessary to treat or prevent imminent or life-threatening deterioration. Critical care was time spent personally by me on the following activities: development of treatment plan with patient and/or surrogate as well as nursing, discussions with consultants, evaluation of patient's response to treatment, examination of patient, obtaining history from patient or surrogate, ordering and performing treatments and interventions, ordering and review of laboratory studies, ordering and review of radiographic studies, pulse oximetry and re-evaluation of patient's condition. PATIENT WITH HYPERTENSIVE EMERGENCY REQUIRING NITROGLYCERIN DRIP, STEPDOWN ADMISSION NEPHROLOGY CONSULTED FOR RENAL FAILURE  Medications Ordered in ED Medications  nitroGLYCERIN 50 mg in dextrose 5 % 250 mL (0.2 mg/mL) infusion (0 mcg/min Intravenous Hold 03/01/17 0136)  aspirin chewable tablet 324 mg (324 mg Oral Given 03/01/17 0039)     Initial Impression / Assessment and Plan / ED Course  I have reviewed the triage vital signs and the nursing notes.  Pertinent labs & imaging results that were available during my care of the patient were reviewed by me and considered in my medical decision making (see chart for details).     2:11 AM PT IN THE ED FOR WORSENING LE EDEMA AND ALSO SEVERE HYPERTENSION HE HAS NOT BEEN COMPLIANT WITH BP MEDS IN  OVER A YEAR HE HAS SIGNIFICANT RENAL FAILURE I ALREADY SPOKE TO NEPHROLOGY, NO IMMEDIATE NEED FOR DIALYSIS HE IS MAKING URINE, BLADDER SCAN SHOWS URINE BUT NO RETENTION ADVISED PATIENT NEED TO BE COMPLIANT AS HE AT RISK FOR NEEDING DIALYSIS LONG TERM D/W INTERNAL MEDICINE SERVICE FOR ADMIT WILL START NITROGLYCERIN DRIP  Final Clinical Impressions(s) / ED Diagnoses   Final diagnoses:  AKI (acute kidney injury) ([place])  Hypertensive emergency    New Prescriptions New Prescriptions   No medications  on file   I personally performed the services described in this documentation, which was scribed in my presence. The recorded information has been reviewed and is accurate.        Ripley Fraise, MD 03/01/17 [phone]

## 2017-03-01 NOTE — ED Notes (Signed)
Report called.  Pt can go to floor after 0730.

## 2017-03-01 NOTE — Progress Notes (Signed)
IM resident paged to make aware that pt's MAP is remaining in high 150's despite increased titration of nitro and addition of norvasc PO.   No new orders at this time  Lorre Munroe

## 2017-03-01 NOTE — ED Notes (Signed)
Notified nurse first I stat troponin results.

## 2017-03-02 ENCOUNTER — Inpatient Hospital Stay (HOSPITAL_COMMUNITY): Payer: Medicaid Other

## 2017-03-02 DIAGNOSIS — I360 Nonrheumatic tricuspid (valve) stenosis: Secondary | ICD-10-CM

## 2017-03-02 LAB — CBC
HEMATOCRIT: 21.1 % — AB (ref 39.0–52.0)
Hemoglobin: 7.2 g/dL — ABNORMAL LOW (ref 13.0–17.0)
MCH: 26.7 pg (ref 26.0–34.0)
MCHC: 32.7 g/dL (ref 30.0–36.0)
MCV: 81.8 fL (ref 78.0–100.0)
Platelets: 165 10*3/uL (ref 150–400)
RBC: 2.58 MIL/uL — ABNORMAL LOW (ref 4.22–5.81)
RDW: 15.4 % (ref 11.5–15.5)
WBC: 5.1 10*3/uL (ref 4.0–10.5)

## 2017-03-02 LAB — RENAL FUNCTION PANEL
Albumin: 2.9 g/dL — ABNORMAL LOW (ref 3.5–5.0)
Anion gap: 21 — ABNORMAL HIGH (ref 5–15)
BUN: 158 mg/dL — AB (ref 6–20)
CHLORIDE: 97 mmol/L — AB (ref 101–111)
CO2: 15 mmol/L — AB (ref 22–32)
Calcium: 7 mg/dL — ABNORMAL LOW (ref 8.9–10.3)
Creatinine, Ser: 31.35 mg/dL — ABNORMAL HIGH (ref 0.61–1.24)
GFR calc Af Amer: 2 mL/min — ABNORMAL LOW (ref >60)
GFR calc non Af Amer: 2 mL/min — ABNORMAL LOW (ref >60)
GLUCOSE: 101 mg/dL — AB (ref 65–99)
POTASSIUM: 3.8 mmol/L (ref 3.5–5.1)
Phosphorus: 9.9 mg/dL — ABNORMAL HIGH (ref 2.5–4.6)
Sodium: 133 mmol/L — ABNORMAL LOW (ref 135–145)

## 2017-03-02 LAB — HEPATITIS B CORE ANTIBODY, TOTAL: Hep B Core Total Ab: NEGATIVE

## 2017-03-02 LAB — HEPATITIS C ANTIBODY: HCV Ab: 0.1 s/co ratio (ref 0.0–0.9)

## 2017-03-02 LAB — HEMOGLOBIN A1C
HEMOGLOBIN A1C: 5.8 % — AB (ref 4.8–5.6)
MEAN PLASMA GLUCOSE: 120 mg/dL

## 2017-03-02 LAB — ECHOCARDIOGRAM COMPLETE: Weight: 4268.11 oz

## 2017-03-02 LAB — GLUCOSE, CAPILLARY
GLUCOSE-CAPILLARY: 103 mg/dL — AB (ref 65–99)
Glucose-Capillary: 92 mg/dL (ref 65–99)
Glucose-Capillary: 97 mg/dL (ref 65–99)

## 2017-03-02 LAB — HEPATITIS B SURFACE ANTIGEN: Hepatitis B Surface Ag: NEGATIVE

## 2017-03-02 LAB — HEPATITIS B SURFACE ANTIBODY, QUANTITATIVE

## 2017-03-02 MED ORDER — HEPATITIS B VAC RECOMBINANT 10 MCG/ML IJ SUSP
1.0000 mL | Freq: Once | INTRAMUSCULAR | Status: DC
  Filled 2017-03-02 (×5): qty 1

## 2017-03-02 MED ORDER — HEPATITIS B VAC RECOMBINANT 10 MCG/ML IJ SUSP
1.0000 mL | Freq: Once | INTRAMUSCULAR | Status: DC
  Filled 2017-03-02: qty 1

## 2017-03-02 MED ORDER — CALCIUM ACETATE (PHOS BINDER) 667 MG PO CAPS
667.0000 mg | ORAL_CAPSULE | Freq: Three times a day (TID) | ORAL | Status: DC
  Administered 2017-03-02 – 2017-03-05 (×6): 667 mg via ORAL
  Filled 2017-03-02 (×6): qty 1

## 2017-03-02 MED ORDER — CLONIDINE HCL 0.2 MG PO TABS
0.2000 mg | ORAL_TABLET | Freq: Two times a day (BID) | ORAL | Status: DC
  Administered 2017-03-03: 0.2 mg via ORAL
  Filled 2017-03-02: qty 1

## 2017-03-02 MED ORDER — LABETALOL HCL 300 MG PO TABS
300.0000 mg | ORAL_TABLET | Freq: Two times a day (BID) | ORAL | Status: DC
  Administered 2017-03-02 – 2017-03-05 (×6): 300 mg via ORAL
  Filled 2017-03-02 (×6): qty 1

## 2017-03-02 MED ORDER — NITROGLYCERIN IN D5W 200-5 MCG/ML-% IV SOLN: 10.0000 ug/min | INTRAVENOUS | Status: DC

## 2017-03-02 MED ORDER — FUROSEMIDE 10 MG/ML IJ SOLN
160.0000 mg | Freq: Three times a day (TID) | INTRAMUSCULAR | Status: DC
  Administered 2017-03-02 – 2017-03-05 (×9): 160 mg via INTRAVENOUS
  Filled 2017-03-02: qty 10
  Filled 2017-03-02 (×4): qty 16
  Filled 2017-03-02: qty 4
  Filled 2017-03-02 (×7): qty 16

## 2017-03-02 MED ORDER — CLONIDINE HCL 0.2 MG PO TABS
0.2000 mg | ORAL_TABLET | Freq: Three times a day (TID) | ORAL | Status: DC
  Administered 2017-03-02: 0.2 mg via ORAL
  Filled 2017-03-02: qty 1

## 2017-03-02 NOTE — Progress Notes (Signed)
I spoke with family as a second opinion per request of Dr. Jonnie Finner.  They had a lot of questions about renal transplantation as first option of treatment of Patrick Ibarra's ESRD.   We discussed the importance of compliance prior to transplantation and need for dialysis as first line therapy.   All questions were answered to there satisfaction.   They are agreeable to proceed with RRT preparation, with eventual transplantation. Dr Jonnie Finner informed. Patrick Ibarra

## 2017-03-02 NOTE — Progress Notes (Signed)
[facility] Progress Note  Subjective: 1200 UOP, no new c/o  Vitals:   03/02/17 0829 03/02/17 0900 03/02/17 1000 03/02/17 1100  BP: (!) 161/107 (!) 152/103 (!) 144/98   Pulse: 89 88 85 90  Resp: 17 19 15  (!) 23  Temp: (!) 96.7 F (35.9 C)     TempSrc: Axillary     SpO2: 95% 99% 99% 99%  Weight:      Height:        Inpatient medications: . amLODipine  10 mg Oral Daily  . aspirin EC  81 mg Oral Daily  . furosemide  80 mg Intravenous Q6H  . heparin  5,000 Units Subcutaneous Q8H  . labetalol  200 mg Oral BID  . sodium chloride flush  3 mL Intravenous Q12H  . sodium chloride flush  3 mL Intravenous Q12H   . sodium chloride    . nitroGLYCERIN 10 mcg/min (03/02/17 1000)   sodium chloride, sodium chloride flush  Exam: Gen alert, NAD No jvd or bruits Chest clear bilat RRR no MRG Abd soft ntnd no hsm Ext still 2+ pitting edema bilat LE's Neuro is alert, Ox 3 , nf  CXR clear  UA > clear, 0-5 rbc/ wbc, 100 protein Renal US 6/29 > marked echogenicity, 8-9 cm kidneys w/ loss of CM differentiation   Old chart: Mar 2013 > creat 1.8- 2.01 December 2013 > creat 3.5- 3.8   Assess: 1  Advanced CKD , stage V now,creat 30, last here in 2015 at which time creat was 3.7.  Didn't follow up and hasn't used BP medications for last year per H&P.  UA bland.  Severe HTN. Renal US shows atrophic small kidneys c/w end stage disease. I've explained renal failure, ESRD, need for dialysis.  Pt and wife are struggling to absorb this bad news, they would like 2nd opinion from within [facility].  Will provide and proceed from there.   2  Vol overload - mild / mod, increase lasix 3  HTN urgency - norvasc/ labetalol, will add clonidine , Vilma Prader, wean off IV NTG   Plan - as above   Kelly Splinter MD [facility] pager [phone]   03/02/2017, 12:06 PM    Recent Labs Lab 02/28/17 2351 03/01/17 0352 03/02/17 0329  NA 134* 137 133*  K 4.2 4.4 3.8  CL 99* 99* 97*   CO2 17* 16* 15*  GLUCOSE 106* 84 101*  BUN 159* 158* 158*  CREATININE 31.23* 31.44* 31.35*  CALCIUM 7.0* 7.2* 7.0*  PHOS  --  9.5* 9.9*    Recent Labs Lab 03/01/17 0352 03/02/17 0329  AST 14*  --   ALT 11*  --   ALKPHOS 88  --   BILITOT 0.9  --   PROT 7.3  --   ALBUMIN 3.3* 2.9*    Recent Labs Lab 02/28/17 2351 03/01/17 0352 03/02/17 0329  WBC 4.7 4.4 5.1  HGB 7.4* 7.6* 7.2*  HCT 22.9* 23.3* 21.1*  MCV 83.3 83.2 81.8  PLT 179 175 165   Iron/TIBC/Ferritin/ %Sat    Component Value Date/Time   IRON 37 (L) 03/01/2017 0752   TIBC 295 03/01/2017 0752   FERRITIN 154 03/01/2017 0422   IRONPCTSAT 13 (L) 03/01/2017 2979

## 2017-03-02 NOTE — Progress Notes (Signed)
  Date: 03/02/2017  Patient name: SHANNEN VERNON  Medical record number: [id number]  Date of birth: 1980-11-23   I have seen and evaluated this patient and I have discussed the plan of care with the house staff. Please see their note for complete details. I concur with their findings with the following additions/corrections: Mr Paskett is more alert and interactive today and not quite as slow to respond. He has no specific complaints today. Despite documentation of frequent conversations about the renal failure and need for dialysis yesterday, the patient still has not yet grasped his clinical situation. He thinks that his grandmother was on dialysis due to cancer of some sort. He does not know anybody else who has been on hemodialysis. He is hoping that his renal function will improve and he will not need dialysis. This has not yet occurred and he will likely need vascular access and hemodialysis this admission if he is in agreement. Nephrology has started a phosphate binder. We will start hepatitis B vaccination today in anticipation of hemodialysis need.  Due to his long-standing uncontrolled hypertension, we did not want to lower his blood pressure too quickly. Overnight, his MAP decreased to 109 lower than our goal of 125. Amlodipine and labetalol have been started yesterday and due to the lower than goal blood pressure, his nitroglycerin drip has been stopped. Nephrology has additionally added clonidine today.  He was also volume overloaded likely due to renal failure. However we have obtained an echo although the results are still pending.  Bartholomew Crews, MD 03/02/2017, 1:14 PM

## 2017-03-02 NOTE — Progress Notes (Signed)
Subjective: He is feeling okay this morning with some decrease in the sense of leg tightness. He is not sure if there has been a significant change in his urine output compared to yesterday. He has no chest pain or shortness of breath. His wife is present at bedside today and very interested in discussing plans for his newly diagnosed severe medical renal disease with the nephrologists.  Objective:  Vital signs in last 24 hours: Vitals:   03/02/17 0900 03/02/17 1000 03/02/17 1100 03/02/17 1200  BP: (!) 152/103 (!) 144/98 (!) 148/98 (!) 144/91  Pulse: 88 85 90 84  Resp: 19 15 (!) 23 17  Temp:      TempSrc:      SpO2: 99% 99% 99% 97%  Weight:      Height:        Physical Exam  Constitutional: He is well-developed, well-nourished, and in no distress.  Neck: No JVD present.  Cardiovascular: Normal rate and regular rhythm.  Exam reveals no friction rub.   No murmur heard. Pulmonary/Chest: Effort normal and breath sounds normal.  Abdominal: Soft. He exhibits no distension. There is no tenderness.  Musculoskeletal: He exhibits edema.  1-2+ pitting edema in lower extremities below the knees  Skin: Skin is warm and dry.  Psychiatric: Affect normal.   Assessment/Plan:  Principal Problem:   Hypertensive emergency Active Problems:   Diabetes mellitus ([place])   Renal failure (ARF), acute on chronic (HCC)   CHF (congestive heart failure) (HCC)   Obesity   Anemia  #Hypertensive Emergency #Elevated Troponin His blood pressure is at or below goal for today so we can aim to wean the nitro drip while increasing oral antihypertensive medication, with a MAP goal of around 110. Troponin is downtrending, likely related to demand with severe hypertension plus renal failure. We will continue amlodipine 10mg , labetalol, and start clonidine today hopefull to wean off IV infusion completely before end of day. If this target is achieved with only oral medication, he could safely transfer to the  medical floor.  #Chronic kidney disease stage 5/acute on chronic renal Failure Exact etiology is unclear whether previously untreated diabetes or hypertension have contributed to chronic medical renal disease. There is no significant change on repeat labs except slight worsening of metabolic acidosis to CO2 of 15. He has had an unimpressive response to IV diuretics so far and we will increase the dose today. We will check intact PTH and start phosphate binders today, but likely his metabolic derangements will end up being controlled with dialysis. We will also start hepatitis B vaccination series in anticipation of hemodialysis in the future. He and his wife are having difficulty with the new diagnosis of probably end stage renal disease but we greatly appreciate the nephrology service and nursing staff for their continued counseling of the family.   #Acute on Chronic Systolic Congestive Heart Failure Prior ECHO 12/2013 with LVEF of 45-50% and diffuse hypokinesis. He was not taking any medications at home. Pt markedly volume-up on exam and BNP 1300. Repeat echo has been completed and we await read. I suspect function may be diminished at present due to high afterload but he could also need ischemic evaluation if there are focal defects.  #Type 2 Diabetes He was previously on insulin however has been off of all medications for several years. Last A1c was in 12/2013 which was 7.5. Was previously 8 in 2013. His blood sugars remain normal here without intervention and repeat Hgb A1c is 5.8%, although this  is less reliable in the context of CKD5/renal failure.  #Anemia of chronic renal disease Hb 7.2 on repeat today. Iron studies show low saturation % but normal ferritin. We will follow up with nephrology recommendations on deciding iron supplementation and likely erythropoetin.  Dispo: Anticipated discharge once volume status improves and he is not requiring intravenous medicine for blood pressure  control. Case management consulted to help with PCP and medication assistance.   Collier Salina, MD PGY-III Internal Medicine Resident Pager# [phone] 03/02/2017, 12:32 PM

## 2017-03-02 NOTE — Progress Notes (Signed)
OT Cancellation Note  Patient Details Name: Patrick Ibarra MRN: [id number] DOB: 1981/01/09   Cancelled Treatment:    Reason Eval/Treat Not Completed: OT screened, no needs identified, will sign off. Please re-consult if needs change. Thank you for this referral.  Binnie Kand M.S., OTR/L Pager: [phone]  03/02/2017, 8:12 AM

## 2017-03-02 NOTE — Progress Notes (Signed)
  Echocardiogram 2D Echocardiogram has been performed.  Patrick Ibarra 03/02/2017, 10:37 AM

## 2017-03-02 NOTE — Progress Notes (Signed)
Spent approximately 30 minutes discussing Kidney failure, why its causing his BP to be elevated, functions of the kidney, s/s of fluid volume overload, and different ways to get dialysis with patient and wife. Also provided patient Kidney Failure booklet. Asked patient and wife to review. Will follow up with teaching tomorrow.

## 2017-03-03 ENCOUNTER — Inpatient Hospital Stay (HOSPITAL_COMMUNITY): Payer: Medicaid Other

## 2017-03-03 DIAGNOSIS — Z01810 Encounter for preprocedural cardiovascular examination: Secondary | ICD-10-CM

## 2017-03-03 LAB — CBC
HCT: 22 % — ABNORMAL LOW (ref 39.0–52.0)
Hemoglobin: 7.1 g/dL — ABNORMAL LOW (ref 13.0–17.0)
MCH: 26.4 pg (ref 26.0–34.0)
MCHC: 32.3 g/dL (ref 30.0–36.0)
MCV: 81.8 fL (ref 78.0–100.0)
PLATELETS: 168 10*3/uL (ref 150–400)
RBC: 2.69 MIL/uL — ABNORMAL LOW (ref 4.22–5.81)
RDW: 15.2 % (ref 11.5–15.5)
WBC: 3.8 10*3/uL — AB (ref 4.0–10.5)

## 2017-03-03 LAB — BASIC METABOLIC PANEL
Anion gap: 20 — ABNORMAL HIGH (ref 5–15)
BUN: 158 mg/dL — AB (ref 6–20)
CALCIUM: 6.6 mg/dL — AB (ref 8.9–10.3)
CO2: 15 mmol/L — ABNORMAL LOW (ref 22–32)
CREATININE: 31.11 mg/dL — AB (ref 0.61–1.24)
Chloride: 99 mmol/L — ABNORMAL LOW (ref 101–111)
GFR calc Af Amer: 2 mL/min — ABNORMAL LOW (ref >60)
GFR, EST NON AFRICAN AMERICAN: 2 mL/min — AB (ref >60)
GLUCOSE: 95 mg/dL (ref 65–99)
Potassium: 4.2 mmol/L (ref 3.5–5.1)
SODIUM: 134 mmol/L — AB (ref 135–145)

## 2017-03-03 LAB — GLUCOSE, CAPILLARY
GLUCOSE-CAPILLARY: 95 mg/dL (ref 65–99)
Glucose-Capillary: 95 mg/dL (ref 65–99)
Glucose-Capillary: 108 mg/dL — ABNORMAL HIGH (ref 65–99)
Glucose-Capillary: 112 mg/dL — ABNORMAL HIGH (ref 65–99)
Glucose-Capillary: 121 mg/dL — ABNORMAL HIGH (ref 65–99)

## 2017-03-03 LAB — PTH, INTACT AND CALCIUM
Calcium, Total (PTH): 7.4 mg/dL — ABNORMAL LOW (ref 8.7–10.2)
PTH: 46 pg/mL (ref 15–65)

## 2017-03-03 NOTE — Progress Notes (Signed)
Responded to consult for major life transitions and advanced directive for pt who may not proceed w/ kidney disease treatment, even if he'd die if not. Pt was alert, w/ significant other in rm, understood process and substance of decisions he may decide if he chooses as presented on form. He said he had decisions to make about which way to go. Family is from [place]; they are non-denominational Christians. Provided emotional/spiritual support and prayer -- which they appreciated. Advised pt he may ask nurse to call for chaplain at any time, whether to complete form or to talk further.    03/03/17 1000  Clinical Encounter Type  Visited With Patient and family together  Visit Type Initial;Psychological support;Spiritual support;Social support  Referral From Nurse  Spiritual Encounters  Spiritual Needs Brochure;Prayer;Emotional  Stress Factors  Patient Stress Factors Health changes;Major life changes  Family Stress Factors Family relationships;Health changes;Loss of control   Patrick Ibarra

## 2017-03-03 NOTE — Consult Note (Signed)
Asked by Dr Benjamine Mola to see pt for dialysis access planning.  Pt states he currently has not consented to hemodialysis and wishes to defer talking about access procedures at this point.  Please reconsult after pt is willing to consent to an access procedure and hemodialysis.  Vein map ordered.  Ruta Hinds, MD [facility] Office: [phone] Pager: [phone]

## 2017-03-03 NOTE — Progress Notes (Signed)
Patient ID: Patrick Ibarra, male   DOB: 08-08-1981, 36 y.o.   MRN: [id number] S:No new complaints O:BP 112/79   Pulse 78   Temp 97.7 F (36.5 C) (Oral)   Resp 18   Ht 6\' 1"  (1.854 m)   Wt 122.1 kg (269 lb 2.9 oz)   SpO2 98%   BMI 35.51 kg/m   Intake/Output Summary (Last 24 hours) at 03/03/17 1035 Last data filed at 03/03/17 0900  Gross per 24 hour  Intake           404.08 ml  Output             1075 ml  Net          -670.92 ml   Intake/Output: I/O last 3 completed shifts: In: 774.1 [P.O.:620; I.V.:88.1; IV Piggyback:66] Out: 1937 [Urine:1650]  Intake/Output this shift:  Total I/O In: 50 [P.O.:50] Out: 300 [Urine:300] Weight change: 1.1 kg (2 lb 6.8 oz) Gen:WD WN AAM in NAD CVS:no rub Resp:cta TKW:IOXBDZ Ext:1+edema bilateral lower ext   Recent Labs Lab 02/28/17 2351 03/01/17 0352 03/02/17 0329 03/03/17 0518  NA 134* 137 133* 134*  K 4.2 4.4 3.8 4.2  CL 99* 99* 97* 99*  CO2 17* 16* 15* 15*  GLUCOSE 106* 84 101* 95  BUN 159* 158* 158* 158*  CREATININE 31.23* 31.44* 31.35* 31.11*  ALBUMIN  --  3.3* 2.9*  --   CALCIUM 7.0* 7.2* 7.0* 6.6*  PHOS  --  9.5* 9.9*  --   AST  --  14*  --   --   ALT  --  11*  --   --    Liver Function Tests:  Recent Labs Lab 03/01/17 0352 03/02/17 0329  AST 14*  --   ALT 11*  --   ALKPHOS 88  --   BILITOT 0.9  --   PROT 7.3  --   ALBUMIN 3.3* 2.9*   No results for input(s): LIPASE, AMYLASE in the last 168 hours. No results for input(s): AMMONIA in the last 168 hours. CBC:  Recent Labs Lab 02/28/17 2351 03/01/17 0352 03/02/17 0329 03/03/17 0518  WBC 4.7 4.4 5.1 3.8*  HGB 7.4* 7.6* 7.2* 7.1*  HCT 22.9* 23.3* 21.1* 22.0*  MCV 83.3 83.2 81.8 81.8  PLT 179 175 165 168   Cardiac Enzymes:  Recent Labs Lab 02/28/17 2351 03/01/17 0352 03/01/17 0752 03/01/17 1437  TROPONINI 0.10* 0.09* 0.10* 0.08*   CBG:  Recent Labs Lab 03/02/17 0827 03/02/17 1304 03/02/17 1712 03/02/17 2206 03/03/17 0945  GLUCAP 92 103*  95 97 95    Iron Studies:  Recent Labs  03/01/17 0422 03/01/17 0752  IRON  --  37*  TIBC  --  295  FERRITIN 154  --    Studies/Results: No results found. Marland Kitchen amLODipine  10 mg Oral Daily  . aspirin EC  81 mg Oral Daily  . calcium acetate  667 mg Oral TID WC  . cloNIDine  0.2 mg Oral BID  . heparin  5,000 Units Subcutaneous Q8H  . hepatitis b vaccine for adults  1 mL Intramuscular Once  . labetalol  300 mg Oral BID  . sodium chloride flush  3 mL Intravenous Q12H  . sodium chloride flush  3 mL Intravenous Q12H    BMET    Component Value Date/Time   NA 134 (L) 03/03/2017 0518   K 4.2 03/03/2017 0518   CL 99 (L) 03/03/2017 0518   CO2 15 (L) 03/03/2017 0518  GLUCOSE 95 03/03/2017 0518   BUN 158 (H) 03/03/2017 0518   CREATININE 31.11 (H) 03/03/2017 0518   CREATININE 3.16 (H) 01/05/2014 1459   CALCIUM 6.6 (L) 03/03/2017 0518   GFRNONAA 2 (L) 03/03/2017 0518   GFRAA 2 (L) 03/03/2017 0518   CBC    Component Value Date/Time   WBC 3.8 (L) 03/03/2017 0518   RBC 2.69 (L) 03/03/2017 0518   HGB 7.1 (L) 03/03/2017 0518   HCT 22.0 (L) 03/03/2017 0518   PLT 168 03/03/2017 0518   MCV 81.8 03/03/2017 0518   MCH 26.4 03/03/2017 0518   MCHC 32.3 03/03/2017 0518   RDW 15.2 03/03/2017 0518   LYMPHSABS 1.0 12/21/2013 0510   MONOABS 0.1 12/21/2013 0510   EOSABS 0.0 12/21/2013 0510   BASOSABS 0.0 12/21/2013 0510     Assessment/Plan:  1. Progressive CKD now stage 5 not yet on dialysis-  Due to hypertensive nephrosclerosis.  Will need to have tdc and avf/avg placed this admission.  He is reluctant to do anything today but understands what he has been consistently told about his advanced CKD. 2. Hypertensive urgency- resolved with medications 3. Volume overload- diuresing 4. DM 5. Anemia of CKD stage 5- will check iron stores and start esa 6. SHPTH- with hyperphosphatemia- will need dietician and info on low phos diet and start phosphate binders.  Follow iPTH 7. Disposition- will  need vascular access and outpatient HD to be arranged before discharge.  I stressed the importance of adherence to HD and medications.  We discussed renal transplantation and that he will not survive holding off HD until he can get a kidney (waiting period is 3-6 years).  We also discussed different modalities of RRT including HD and PD as well as home HD.  He wants to think about it before he proceeds with access/dialysis.  We also discussed the risks of not proceeding with dialysis, which include death. He voiced understanding.   Donetta Potts, MD [employer] [phone]

## 2017-03-03 NOTE — Progress Notes (Signed)
Clinical Social Worker received consult stating patient does not have insurance and may be a new dialysis patient. CSW contacted Development worker, community and left voicemail to make them aware that patient will need a medicaid application. CSW also spoke with [facility] on the unit to make her aware that patient may need assistance with medications.   Clinical Social Worker will sign off for now as social work intervention is no longer needed. Please consult [place] again if new need arises.  Rhea Pink, MSW, [place]

## 2017-03-03 NOTE — Progress Notes (Signed)
Right  Upper Extremity Vein Map    Cephalic  Segment Diameter Depth Comment  1. Axilla 2.43mm 4.30mm   2. Mid upper arm 2.7mm 3.51mm   3. Above AC 4.28mm 5.54mm   4. In [facility] 4.56mm 3.29mm   5. Below AC 3.58mm 5.60mm Multiple branches  6. Mid forearm 3.58mm 5.75mm   7. Wrist 2.62mm 31mm    mm mm    mm mm    mm mm    Basilic  Segment Diameter Depth Comment  1. Axilla 4.59mm 8.61mm   2. Mid upper arm 4.58mm 9.49mm   3. Above AC 4.5mm 0.76mm   4. In [facility] 5.62mm 65mm Branch  5. Below AC 2.62mm 3.20mm   6. Mid forearm 1.74mm 4.46mm   7. Wrist 28mm 2.29mm    mm mm    mm mm    mm mm    Left Upper Extremity Vein Map    Cephalic  Segment Diameter Depth Comment  1. Axilla 3.50mm 2.45mm   2. Mid upper arm 2.58mm 2.72mm branch  3. Above AC 2.81mm 1.103mm   4. In AC 4.23mm 5.103mm   5. Below AC 4.49mm 78mm branch  6. Mid forearm 2.76mm 5.83mm   7. Wrist 2.6mm 3.70mm    mm mm    mm mm    mm mm    Basilic  Segment Diameter Depth Comment  1. Axilla 7.41mm 5mm   2. Mid upper arm 53mm 65mm   3. Above AC 5.41mm 61mm   4. In [facility] 4.2mm 4.57mm branch  5. Below AC 2.38mm 3.63mm   6. Mid forearm 2.72mm 6.72mm   7. Wrist 1.76mm 65mm    mm mm    mm mm    mm mm

## 2017-03-03 NOTE — Progress Notes (Signed)
Patient vomited during transfer on the way to [place]. He states he stood up too fast and got dizzy. Report was given to [facility], RN via phone prior to transfer. Patient was hooked up to their telemetry box #13. Patient's needs for a urinal and gown change were expressed to Network engineer. Patient was still waiting on staff when I left.

## 2017-03-03 NOTE — Progress Notes (Signed)
° °  Subjective:  Patrick Ibarra was seen in room this morning with his wife at his bedside. He reported feeling well and stated that he is still deciding whether to go ahead with hemodialysis. Stated he would like some time to review educational material prior to making his decision.   He denied any chest pain or sob. Stated that he is doing well and does not have any complaints regarding his hospital stay.  Objective:  Vital signs in last 24 hours: Vitals:   03/03/17 0800 03/03/17 0900 03/03/17 1000 03/03/17 1300  BP: (!) 139/95 (!) 142/89 112/79 114/70  Pulse: 80 82 78 75  Resp: 12 14 18 20   Temp:    97.5 F (36.4 C)  TempSrc:    Oral  SpO2: 100% 95% 98% 98%  Weight:      Height:       Physical Exam  Constitutional: He is oriented to person, place, and time and well-developed, well-nourished, and in no distress.  Cardiovascular: Normal heart sounds.   No murmur heard. Pitting edema   Pulmonary/Chest: Effort normal.  Neurological: He is alert and oriented to person, place, and time.  Skin: No rash noted. No erythema.  Psychiatric: Affect normal.    Echo showing EF of 60-65% Renal ultrasound (6/30)-atrophy and renal parenchyma consistent with medical renal diasease Chest x-ray (6/30): no consolidation or effusion noted   Assessment/Plan:  36 y/o M presented with leg swelling and jaw pain. Admitted to ED with BP of 204/148 and MAP of 167 with associated end-organ damage.    1) Hypertensive emergency: Blood pressure over past 24 hrs has been ranging 110-161/72-107, MAP=77. He is being managed on amlodipine 10mg  QD, labetalol 300mg  BID with goal MAP~115 to gradually titrate considering his long standing uncontrolled htn.   Pt's blood pressure has been managed faster than expected and therefore we opted to discontinue nitroglycerin and clonidine.    2) Diabetes mellitus Type II: HbA1c=5.8 Well controlled blood glucose without any antihyperglycemic medications  Will continue  glucose checks   3) Renal failure (ARF), acute on chronic [facility]):  Renal failure is likely due to long standing uncontrolled HTN. Possible other etiologies include DM type II. HIV is unlikely cause as non-reactive based on HIV screen. Given Hep B in preparation for hemodialysis given low Hbsab Given phosphate binder in preparation for hemodialysis, follow PTH level   Pt given educational material about starting hemodialysis Seen by nephrology and education was given regarding hemodialysis being the only medical option  Continue CMET to monitor electrolytes Seen by chaplain to discuss major life transitions   Vascular consult requested for HD access- pt refused to get access and stated did not want to  talk about HD at this time  4) CHF (congestive heart failure) ([place]) Diuresing with a modest response on high dose lasix. Will consider transitioning to po diuretic if pt continues to refuse dialysis. Fluid restriction 1274mL Follow daily weights, I/O   5) Anemia  Anemia likely due to renal disease considering the inappropriately low absolute reticulocyte count=28.2 and pct=1% given the low hb (2.5). Checking iron studies to give epo   Dispo: Anticipated discharge in approximately 2 days if patient remains opposed to hemodialysis this admission.   [facility] Internal Medicine PGY1 BZJIR:[phone] 03/03/2017, 5:15 PM

## 2017-03-03 NOTE — Progress Notes (Signed)
  Date: 03/03/2017  Patient name: Patrick Ibarra  Medical record number: [id number]  Date of birth: 18-Jun-1981   I have seen and evaluated this patient and I have discussed the plan of care with the house staff. Please see their note for complete details. I concur with their findings with the following additions/corrections: [name] was seen on morning rounds. He is still having a difficult time accepting his diagnosis of renal failure and need for hemodialysis. Our team discussed his care with Dr. Marval Regal. We consulted vascular but the patient was not yet ready to proceed with access. We will continue to offer support but unfortunately he will need to make a decision in the not too distant future. He has indicated he has had no friends or family who had bad experiences with hemodialysis. Dr. Benjamine Mola was able to elicit some uremic symptoms from the wife including some confusion, slowness to respond, and altered taste. We will continue to offer support.  Bartholomew Crews, MD 03/03/2017, 2:47 PM

## 2017-03-04 ENCOUNTER — Encounter (HOSPITAL_COMMUNITY)
Admission: EM | Disposition: A | Discharge status: Home / Self Care | Payer: Self-pay | Source: Home / Self Care | Attending: Internal Medicine

## 2017-03-04 LAB — CBC
HCT: 21 % — ABNORMAL LOW (ref 39.0–52.0)
HEMOGLOBIN: 6.8 g/dL — AB (ref 13.0–17.0)
MCH: 26.7 pg (ref 26.0–34.0)
MCHC: 32.4 g/dL (ref 30.0–36.0)
MCV: 82.4 fL (ref 78.0–100.0)
Platelets: 172 10*3/uL (ref 150–400)
RBC: 2.55 MIL/uL — AB (ref 4.22–5.81)
RDW: 15.5 % (ref 11.5–15.5)
WBC: 3.6 10*3/uL — ABNORMAL LOW (ref 4.0–10.5)

## 2017-03-04 LAB — RENAL FUNCTION PANEL
ANION GAP: 20 — AB (ref 5–15)
Albumin: 2.8 g/dL — ABNORMAL LOW (ref 3.5–5.0)
BUN: 162 mg/dL — ABNORMAL HIGH (ref 6–20)
CO2: 16 mmol/L — AB (ref 22–32)
CREATININE: 34.27 mg/dL — AB (ref 0.61–1.24)
Calcium: 6.2 mg/dL — CL (ref 8.9–10.3)
Chloride: 96 mmol/L — ABNORMAL LOW (ref 101–111)
GFR calc non Af Amer: 1 mL/min — ABNORMAL LOW (ref >60)
GFR, EST AFRICAN AMERICAN: 2 mL/min — AB (ref >60)
Glucose, Bld: 89 mg/dL (ref 65–99)
Phosphorus: 10.2 mg/dL — ABNORMAL HIGH (ref 2.5–4.6)
Potassium: 3.8 mmol/L (ref 3.5–5.1)
Sodium: 132 mmol/L — ABNORMAL LOW (ref 135–145)

## 2017-03-04 LAB — GLUCOSE, CAPILLARY
GLUCOSE-CAPILLARY: 82 mg/dL (ref 65–99)
Glucose-Capillary: 93 mg/dL (ref 65–99)
Glucose-Capillary: 89 mg/dL (ref 65–99)
Glucose-Capillary: 129 mg/dL — ABNORMAL HIGH (ref 65–99)

## 2017-03-04 LAB — FERRITIN: FERRITIN: 119 ng/mL (ref 24–336)

## 2017-03-04 LAB — IRON AND TIBC
Iron: 27 ug/dL — ABNORMAL LOW (ref 45–182)
Saturation Ratios: 11 % — ABNORMAL LOW (ref 17.9–39.5)
TIBC: 245 ug/dL — ABNORMAL LOW (ref 250–450)
UIBC: 218 ug/dL

## 2017-03-04 LAB — PARATHYROID HORMONE, INTACT (NO CA): PTH: 1495 pg/mL — AB (ref 15–65)

## 2017-03-04 SURGERY — ARTERIOVENOUS (AV) FISTULA CREATION
Anesthesia: Monitor Anesthesia Care | Site: Neck

## 2017-03-04 MED ORDER — AMLODIPINE BESYLATE 5 MG PO TABS
5.0000 mg | ORAL_TABLET | Freq: Every day | ORAL | Status: DC
  Administered 2017-03-05: 5 mg via ORAL
  Filled 2017-03-04: qty 1

## 2017-03-04 NOTE — Care Management Note (Addendum)
Case Management Note  Patient Details  Name: Patrick Ibarra MRN: [id number] Date of Birth: Jun 11, 1981  Subjective/Objective:                 CM spoke to patient and wife in room. Patient admitted with ESRD. Discussed patient wishes for treatment. Patient stated he is not interested in HD at this time. Wife elaborated that they plan on trying changes in diet and "cleansing" techniques they have read about in leu of HD. They state they have a plan to try it short term as a last effort to avoid HD. They plan to be compliant with all meds and appointments. CM listened supportively, and assured them that their autonomy is valued however was very clear that there is no alternative medicine treatment for kidney failure that will can be substituted for HD. Advised extreme caution with their decision as failure to notice subtle changes in patient once home could be very dangerous and possibly fatal. CM offered to establish appointment for patient at [facility], patient agreed. Appointment on AVS. Patient can fill Rx there on DC. Provided w [facility] brochure   Action/Plan:   Expected Discharge Date:  03/04/17               Expected Discharge Plan:  Home/Self Care  In-House Referral:     Discharge planning Services  CM Consult, [facility]  Post Acute Care Choice:    Choice offered to:     DME Arranged:    DME Agency:     HH Arranged:    HH Agency:     Status of Service:  Completed, signed off  If discussed at [employer], dates discussed:    Additional Comments:  [name], RN 03/04/2017, 2:32 PM

## 2017-03-04 NOTE — Progress Notes (Signed)
  Date: 03/04/2017  Patient name: Patrick Ibarra  Medical record number: [id number]  Date of birth: May 06, 1981   I have seen and evaluated this patient and I have discussed the plan of care with the house staff. Please see their note for complete details. I concur with their findings with the following additions/corrections: Mr. Plemons was seen on morning rounds. He had an episode of nausea and vomiting yesterday afternoon but is currently asymptomatic and eating well. He still has not yet come to a decision regarding dialysis. He is now hospital day 3 and other than treating him with IV Lasix, he is not requiring inpatient services. The team appreciates that we are only able to offer him two poor options which are starting dialysis immediately or returning home with hospice for either a death by uremia or a sudden cardiac death due to hyperkalemia or other acute cause. We need to make this decision today as the decision will dictate several other therapies including access, transfusion, and ancillary medications. He has not yet made a decision and his wife was not in the room. We returned after rounds at 11:30 AM and his wife was still yet not present. Drs Benjamine Mola and Chundi will return this afternoon to obtain a final decision. If he decides on dialysis, we will consult vascular again for access, start iron, start epo, and continue with aggressive inpatient care. If he elects not to start dialysis, I would be hesitant to transfuse as that'll just worsen his volume status and not inline with a more palliative care approach. We would be able to discharge him with hospice as he is in agreement. Await final decision.  Bartholomew Crews, MD 03/04/2017, 2:37 PM

## 2017-03-04 NOTE — Progress Notes (Signed)
Patient ID: Patrick Ibarra, male   DOB: 1981/02/18, 36 y.o.   MRN: [id number] S:No complaints O:BP 128/79   Pulse 84   Temp 97.7 F (36.5 C) (Oral)   Resp 18   Ht 6\' 1"  (1.854 m)   Wt 121.2 kg (267 lb 4.8 oz)   SpO2 100%   BMI 35.27 kg/m   Intake/Output Summary (Last 24 hours) at 03/04/17 0934 Last data filed at 03/04/17 0653  Gross per 24 hour  Intake              612 ml  Output             1300 ml  Net             -688 ml   Intake/Output: I/O last 3 completed shifts: In: 733.1 [P.O.:530; I.V.:5.1; IV Piggyback:198] Out: 2050 [Urine:2050]  Intake/Output this shift:  No intake/output data recorded. Weight change: -0.854 kg (-1 lb 14.1 oz) Gen:WD AAM in NAD CVS:no rub Resp:cta CVE:LFYBOF BPZ:WCHEN edema   Recent Labs Lab 02/28/17 2351 03/01/17 0352 03/01/17 0752 03/02/17 0329 03/03/17 0518 03/04/17 0551  NA 134* 137  --  133* 134* 132*  K 4.2 4.4  --  3.8 4.2 3.8  CL 99* 99*  --  97* 99* 96*  CO2 17* 16*  --  15* 15* 16*  GLUCOSE 106* 84  --  101* 95 89  BUN 159* 158*  --  158* 158* 162*  CREATININE 31.23* 31.44*  --  31.35* 31.11* 34.27*  ALBUMIN  --  3.3*  --  2.9*  --  2.8*  CALCIUM 7.0* 7.2* 7.4* 7.0* 6.6* 6.2*  PHOS  --  9.5*  --  9.9*  --  10.2*  AST  --  14*  --   --   --   --   ALT  --  11*  --   --   --   --    Liver Function Tests:  Recent Labs Lab 03/01/17 0352 03/02/17 0329 03/04/17 0551  AST 14*  --   --   ALT 11*  --   --   ALKPHOS 88  --   --   BILITOT 0.9  --   --   PROT 7.3  --   --   ALBUMIN 3.3* 2.9* 2.8*   No results for input(s): LIPASE, AMYLASE in the last 168 hours. No results for input(s): AMMONIA in the last 168 hours. CBC:  Recent Labs Lab 02/28/17 2351 03/01/17 0352 03/02/17 0329 03/03/17 0518 03/04/17 0551  WBC 4.7 4.4 5.1 3.8* 3.6*  HGB 7.4* 7.6* 7.2* 7.1* 6.8*  HCT 22.9* 23.3* 21.1* 22.0* 21.0*  MCV 83.3 83.2 81.8 81.8 82.4  PLT 179 175 165 168 172   Cardiac Enzymes:  Recent Labs Lab 02/28/17 2351  03/01/17 0352 03/01/17 0752 03/01/17 1437  TROPONINI 0.10* 0.09* 0.10* 0.08*   CBG:  Recent Labs Lab 03/03/17 0945 03/03/17 1302 03/03/17 1650 03/03/17 2024 03/04/17 0757  GLUCAP 95 108* 112* 121* 82    Iron Studies:  Recent Labs  03/04/17 0551  IRON 27*  TIBC 245*  FERRITIN 119   Studies/Results: No results found. Marland Kitchen amLODipine  10 mg Oral Daily  . aspirin EC  81 mg Oral Daily  . calcium acetate  667 mg Oral TID WC  . heparin  5,000 Units Subcutaneous Q8H  . hepatitis b vaccine for adults  1 mL Intramuscular Once  . labetalol  300 mg Oral BID  .  sodium chloride flush  3 mL Intravenous Q12H  . sodium chloride flush  3 mL Intravenous Q12H    BMET    Component Value Date/Time   NA 132 (L) 03/04/2017 0551   K 3.8 03/04/2017 0551   CL 96 (L) 03/04/2017 0551   CO2 16 (L) 03/04/2017 0551   GLUCOSE 89 03/04/2017 0551   BUN 162 (H) 03/04/2017 0551   CREATININE 34.27 (H) 03/04/2017 0551   CREATININE 3.16 (H) 01/05/2014 1459   CALCIUM 6.2 (LL) 03/04/2017 0551   CALCIUM 7.4 (L) 03/01/2017 0752   GFRNONAA 1 (L) 03/04/2017 0551   GFRAA 2 (L) 03/04/2017 0551   CBC    Component Value Date/Time   WBC 3.6 (L) 03/04/2017 0551   RBC 2.55 (L) 03/04/2017 0551   HGB 6.8 (LL) 03/04/2017 0551   HCT 21.0 (L) 03/04/2017 0551   PLT 172 03/04/2017 0551   MCV 82.4 03/04/2017 0551   MCH 26.7 03/04/2017 0551   MCHC 32.4 03/04/2017 0551   RDW 15.5 03/04/2017 0551   LYMPHSABS 1.0 12/21/2013 0510   MONOABS 0.1 12/21/2013 0510   EOSABS 0.0 12/21/2013 0510   BASOSABS 0.0 12/21/2013 0510     Assessment/Plan:  1. Progressive CKD now stage 5 not yet on dialysis-  Due to hypertensive nephrosclerosis.  Will need to have tdc and avf/avg placed this admission.  He is reluctant to do anything today but understands what he has been consistently told about his advanced CKD.  I was quite frank with him that his only 2 options are dialysis or hospice.  He is having some asterixis and I  feel it is time to start dialysis.  Will keep him npo and wait for his aquiessence.    2. Hypertensive urgency- resolved with medications 3. Volume overload- diuresing 4. DM 5. Anemia of CKD stage 5- will check iron stores and start esa 6. SHPTH- with hyperphosphatemia- will need dietician and info on low phos diet and start phosphate binders.  Follow iPTH 7. Disposition- will need vascular access and outpatient HD to be arranged before discharge.  I stressed the importance of adherence to HD and medications.  We discussed renal transplantation and that he will not survive holding off HD until he can get a kidney (waiting period is 3-6 years).  We also discussed different modalities of RRT including HD and PD as well as home HD.  He wants to think about it before he proceeds with access/dialysis.  We also discussed the risks of not proceeding with dialysis, which include death. He voiced understanding.   Donetta Potts, MD [employer] [phone]

## 2017-03-04 NOTE — Progress Notes (Signed)
   Subjective: Patrick Ibarra was seen sitting on his bed and was pleasant. He denied any shortness of breath or chest pain. He states that he feels more fatigued and had one episode of dizziness yesterday when he was standing up from his bed. He stated that he is still thinking about whether or not to go ahead with dialysis.   Objective:  Vital signs in last 24 hours: Vitals:   03/03/17 1300 03/03/17 1757 03/03/17 2029 03/04/17 0511  BP: 114/70 120/64 124/65 (!) 103/56  Pulse: 75 76 78 79  Resp: 20 20 20 18   Temp: 97.5 F (36.4 C) 98.2 F (36.8 C) 97.7 F (36.5 C) 98 F (36.7 C)  TempSrc: Oral Oral Oral Oral  SpO2: 98% 98% 100% 100%  Weight:   267 lb 4.8 oz (121.2 kg)   Height:       Physical Exam  Constitutional: He appears well-developed and well-nourished.  Cardiovascular: Regular rhythm and normal heart sounds.   Pulmonary/Chest: Breath sounds normal.  Neurological: He is alert. Coordination normal.  Slight asterixis was noted on exam   Assessment/Plan: 36 y/o M presented with leg swelling and jaw pain. Admitted to ED with BP of 204/148 and MAP of 167 with associated end-organ damage.    1) Hypertensive emergency: Blood pressure has continued to decrease faster than expected over past 24 hrs ranging 103-142/56-89 and therefore we decreased the amlodipine dose to 5mg  QD and continue previous dose of labetalol 300mg  BID                2) Diabetes mellitus Type II: HbA1c=5.8 Well controlled blood glucose without any antihyperglycemic medications   Will continue glucose checks   3) Renal failure (ARF), acute on chronic [facility]):  Renal failure is likely due to long standing uncontrolled HTN. Possible other etiologies include DM type II. HIV is unlikely cause as non-reactive based on HIV screen. Given Hep B in preparation for hemodialysis given low Hbsab Given phosphate binder in preparation for hemodialysis PTH level came back high at 1495 consistent with renal disease. Will  need to consider vitamin d supplementation.  Pt expresses understanding of the only options being dialysis or palliative care. He and his wife both express understanding that he refuses dialysis.  Consulted palliative care to discuss end of life care and gravity of situation if he chooses not to pursue dialysis.  4) CHF (congestive heart failure) (HCC) Diuresing with a modest response on high dose lasix. Will consider transitioning to po diuretic if pt continues to refuse dialysis. Continues to be on fluid restriction 1257mL Follow daily weights, I/O- Pt has gained 3lbs since admission, but has been net -2115.63mL since admission.   5) Anemia  Anemia likely due to renal disease considering the inappropriately low absolute reticulocyte count=28.2 and pct=1% given the low hb (2.5). Hb continues to be trending low and reached critical value of 6.8 today (03/04/17). We will recheck hb in morning and give epo upon discussing with nephrology.    Dispo: Anticipated discharge in approximately 1 day(s).   Lars Mage, MD Internal Medicine PGY1 [phone] 03/04/2017, 6:53 PM

## 2017-03-05 DIAGNOSIS — N185 Chronic kidney disease, stage 5: Secondary | ICD-10-CM

## 2017-03-05 DIAGNOSIS — N170 Acute kidney failure with tubular necrosis: Secondary | ICD-10-CM

## 2017-03-05 DIAGNOSIS — I161 Hypertensive emergency: Secondary | ICD-10-CM

## 2017-03-05 LAB — GLUCOSE, CAPILLARY
Glucose-Capillary: 88 mg/dL (ref 65–99)
Glucose-Capillary: 111 mg/dL — ABNORMAL HIGH (ref 65–99)

## 2017-03-05 MED ORDER — FUROSEMIDE 80 MG PO TABS: 80.0000 mg | ORAL_TABLET | Freq: Two times a day (BID) | ORAL | 0 refills | Status: DC

## 2017-03-05 MED ORDER — LABETALOL HCL 300 MG PO TABS: 300.0000 mg | ORAL_TABLET | Freq: Two times a day (BID) | ORAL | 0 refills | Status: DC

## 2017-03-05 MED ORDER — HEPATITIS B VAC RECOMBINANT 40 MCG/ML IJ SUSP: 40.0000 ug | Freq: Once | INTRAMUSCULAR | Status: DC

## 2017-03-05 NOTE — Progress Notes (Signed)
Discussed hepatitis B vaccine with Dr. Maricela Bo this morning over the phone- patient asked for shot to be moved and it has not yet been given.  Dose for pre-HD patients is 47mcg given at months 0, 1 and 6.  Order placed by pharmacy buyer for Hepatitis B recombinant vaccine 5mcg/mL - will receive tomorrow.  Plan: -Hepatitis B recombinant vaccine 62mcg/mL IM x1 to be given 7/5 -To be repeated at months 1 and 6 on the outpatient side  **Note: Hepatitis B vaccine is on national backorder, so concentration and total # of shots may differ for future doses. Recommend CDC website for more information on vaccine series/dosing and any changes to availability   Talisha Erby D. Anthony Tamburo, PharmD, BCPS Clinical Pharmacist Pager: [phone] Clinical Phone for 03/05/2017 until 3:30pm: [phone] If after 3:30pm, please call main pharmacy at [phone] 03/05/2017 8:47 AM

## 2017-03-05 NOTE — Progress Notes (Signed)
Subjective: [name] was seen this morning sitting in his bed. He states that he is doing well. He denies chest pain, sob, and dizziness. He said he is waiting to speak to the palliative care physicians.  Objective:  Vital signs in last 24 hours: Vitals:   03/04/17 1725 03/04/17 2217 03/05/17 0500 03/05/17 1035  BP: (!) 132/55 123/76 98/68 132/88  Pulse: 77 80 80 83  Resp:  18 17   Temp: (!) 97.2 F (36.2 C) (!) 97.4 F (36.3 C) 98.6 F (37 C) 98 F (36.7 C)  TempSrc: Oral Oral Oral Oral  SpO2: 100% 100% 100% 100%  Weight:  267 lb 11.2 oz (121.4 kg)    Height:  6\' 1"  (1.854 m)     Physical Exam  Constitutional: He is oriented to person, place, and time and well-developed, well-nourished, and in no distress.  Cardiovascular: Normal rate, regular rhythm and intact distal pulses.   No pitting edema  Pulmonary/Chest: No respiratory distress. He has no wheezes. He exhibits no tenderness.  Neurological: He is alert and oriented to person, place, and time.   Assessment/Plan: 36 y/o M presented with leg swelling and jaw pain.Admitted to ED with BP of 204/148 and MAP of 167 with associated end-organ damage.   1) Hypertensive emergency: Blood pressure seems to be controlled on amlodipine 5mg  qd and labetalol 300mg  bid. His blood pressure has been trending low over the past 24 hrs ranging 98-132/55-68. Therefore, we opted todiscontinue amlodipine 5mg  qd and continue labetolol 300mg  bid.  2)Diabetes mellitus Type II: HbA1c=5.8 Well controlled blood glucose without any antihyperglycemic medications Is encouraged to get repeat HbA1C as outpatient   3) Renal failure (ARF), acute on chronic [facility]):  Renal failure is likely due to long standing uncontrolled HTN. Possible other etiologies include DM type II. HIV is unlikely cause as non-reactive based on HIV screen. Given phosphate binder in preparation for hemodialysis PTH level came back high at 1495 consistent with renal  disease.   Palliative care was consulted to discuss goals of care. Pt seems to be in uremic state and needed additional conversations regarding hospice as a only other option should he opt to not get hemodialysis.   IMTS, Nephrology, and Palliative care have all had extensive discussions with Mr. Yim and his wife regarding the gravity of his condition and they both expressed understading of th urgent need for hemodialysis and the risks involved with not undergoing hd. Despite mentioning that sudden death is possible both Mr. Rawles and his wife refused hd at this moment. They expressed that they would like to consider alkaline water as a treatment method instead. We have therefore shifted to a symptom based approach to treatment considering his continued denial for hemodialysis.  4)CHF (congestive heart failure) (HCC) Continues to be on fluid restriction 1226mL We have been monitoring daily weights and I/O's to note his fluid status- Pt has gained 3lbs since admission (264lb-267lb), but has been net -2146mL since admission. It is thought that he continues to be volume overloaded despite iv diuresis.  We will transition him to lasix 80mg  bid for outpatient therapy.   5) Anemia  Anemia likely due to renal disease considering the inappropriately low absolute reticulocyte count=28.2 and pct=1% given the low hb (2.5). Hb continues to be trending low and reached critical value of 6.8 (03/04/17). We will not be treating him for anemia as we have shifted to a symptom based treatment approach.  Dispo: Anticipated discharge today 03/05/17.   Edmundo Tedesco,  Verne Spurr, MD 03/05/2017, 1:40 PM Pager: Pager: [phone]

## 2017-03-05 NOTE — Progress Notes (Signed)
Discussed complications and possible outcomes from not starting HD with patient. Dietician consulted for diet education per patients request. Case management paged to assist with medication needs Teaching service paged multiple times to complete discharge

## 2017-03-05 NOTE — Progress Notes (Signed)
No charge note  PMT meeting scheduled for 11:00 am with his wife present.  Florentina Jenny, PA-C Palliative Medicine Pager: [phone]

## 2017-03-05 NOTE — Progress Notes (Signed)
Patient discharged to home, AVS reviewed, dietary information provided, IV(x2) removed, prescriptions provided. Patient left floor stable, no complaints, via wheelchair escorted by staff

## 2017-03-05 NOTE — Care Management Note (Addendum)
Met with pt and wife. Pt needs assistance with prescription meds. Awaiting for D/C order and prescriptions. Printed the $4 prescriptions list from [facility] and 2 coupons for labetalol and amlodipine (not sure if MD will continue the same meds). Provided RN the coupons for the labetalol and amlodipine and asked her to give them to pt if MD orders them.   Met with pt again and provided him with the $4 prescriptions list. Encouraged pt to contact the CM office tomorrow if they still need assistance with meds.

## 2017-03-05 NOTE — Progress Notes (Addendum)
  Date: 03/05/2017  Patient name: Patrick Ibarra  Medical record number: [id number]  Date of birth: 01-28-1981   I have seen and evaluated this patient and I have discussed the plan of care with the house staff. Please see their note for complete details. I concur with their findings with the following additions/corrections: Mr. Thurner was seen on morning rounds. His wife was not in the room. He understands that his kidneys are working and that the only 2 options we have to offer him are dialysis or hospice. He continues to decline dialysis. Hospice talked with him and his wife today. He continues to decline dialysis and wants to try alternative treatments. Numerous physicians, nurses, and nose practitioners have told him that he will continue to develop symptoms of uremia and he eventually die from uremia complications but that there was a possibility of a sudden death. However, there is a chance that if he becomes more symptomatic he will return to the ED at which point he will need an be willing to accept dialysis. At this point, we are focusing on a symptom-based approach. Lasix is a symptom-based treatment although there is no way to get him the same oral dose as he has been getting IV in the hospital. We will send him out on oral Lasix. He is on both amlodipine and labetalol but his blood pressure is on the low side. I would rather he be slightly hypertensive and hypotensive so we will only send him out on labetalol. He does not need an aspirin as he has no history of CAD or CVA. His hemoglobin was 6.8 yesterday and we did not treat this as he had not yet committed to dialysis. I do not doubt he has some fatigue due to the anemia, but the majority of his symptoms are from uremia. Also, a transfusion would be a volume bolus and therefore we have not transfused him. We should not do any investigations and let us he commits to hemodialysis as hemodialysis is the treatment for his underlying issue. He will  come to the internal medicine Center on the sixth for hospital follow-up.  Bartholomew Crews, MD 03/05/2017, 3:24 PM

## 2017-03-05 NOTE — Progress Notes (Signed)
Patient ID: Patrick Ibarra, male   DOB: [birth year], 36 y.o.   MRN: [id number] S: no complaints this morning and continues to decline dialysis O:BP 98/68 (BP Location: Left Arm)   Pulse 80   Temp 98.6 F (37 C) (Oral)   Resp 17   Ht 6\' 1"  (1.854 m)   Wt 121.4 kg (267 lb 11.2 oz)   SpO2 100%   BMI 35.32 kg/m   Intake/Output Summary (Last 24 hours) at 03/05/17 0941 Last data filed at 03/05/17 0752  Gross per 24 hour  Intake              426 ml  Output              900 ml  Net             -474 ml   Intake/Output: I/O last 3 completed shifts: In: 558 [P.O.:360; IV Piggyback:198] Out: 1300 [Urine:1300]  Intake/Output this shift:  Total I/O In: -  Out: 400 [Urine:400] Weight change: 0.181 kg (6.4 oz) RXV:[id number] mentation, no distress CVS: rrr, no rub Resp:cta [id number] Ext:1+ edema bilaterally Neuro: +asterixis   Recent Labs Lab 02/28/17 2351 03/01/17 0352 03/01/17 0752 03/02/17 0329 03/03/17 0518 03/04/17 0551  NA 134* 137  --  133* 134* 132*  K 4.2 4.4  --  3.8 4.2 3.8  CL 99* 99*  --  97* 99* 96*  CO2 17* 16*  --  15* 15* 16*  GLUCOSE 106* 84  --  101* 95 89  BUN 159* 158*  --  158* 158* 162*  CREATININE 31.23* 31.44*  --  31.35* 31.11* 34.27*  ALBUMIN  --  3.3*  --  2.9*  --  2.8*  CALCIUM 7.0* 7.2* 7.4* 7.0* 6.6* 6.2*  PHOS  --  9.5*  --  9.9*  --  10.2*  AST  --  14*  --   --   --   --   ALT  --  11*  --   --   --   --    Liver Function Tests:  Recent Labs Lab 03/01/17 0352 03/02/17 0329 03/04/17 0551  AST 14*  --   --   ALT 11*  --   --   ALKPHOS 88  --   --   BILITOT 0.9  --   --   PROT 7.3  --   --   ALBUMIN 3.3* 2.9* 2.8*   No results for input(s): LIPASE, AMYLASE in the last 168 hours. No results for input(s): AMMONIA in the last 168 hours. CBC:  Recent Labs Lab 02/28/17 2351 03/01/17 0352 03/02/17 0329 03/03/17 0518 03/04/17 0551  WBC 4.7 4.4 5.1 3.8* 3.6*  HGB 7.4* 7.6* 7.2* 7.1* 6.8*  HCT 22.9* 23.3* 21.1* 22.0* 21.0*  MCV  83.3 83.2 81.8 81.8 82.4  PLT 179 175 165 168 172   Cardiac Enzymes:  Recent Labs Lab 02/28/17 2351 03/01/17 0352 03/01/17 0752 03/01/17 1437  TROPONINI 0.10* 0.09* 0.10* 0.08*   CBG:  Recent Labs Lab 03/04/17 0757 03/04/17 1252 03/04/17 1656 03/04/17 2223 03/05/17 0751  GLUCAP 82 93 89 129* 88    Iron Studies:  Recent Labs  03/04/17 0551  IRON 27*  TIBC 245*  FERRITIN 119   Studies/Results: No results found. Marland Kitchen amLODipine  5 mg Oral Daily  . aspirin EC  81 mg Oral Daily  . calcium acetate  667 mg Oral TID WC  . heparin  5,000 Units Subcutaneous Q8H  . [  START ON 03/06/2017] Hepatitis B Vac Recombinant  40 mcg Injection Once  . labetalol  300 mg Oral BID  . sodium chloride flush  3 mL Intravenous Q12H  . sodium chloride flush  3 mL Intravenous Q12H    BMET    Component Value Date/Time   NA 132 (L) 03/04/2017 0551   K 3.8 03/04/2017 0551   CL 96 (L) 03/04/2017 0551   CO2 16 (L) 03/04/2017 0551   GLUCOSE 89 03/04/2017 0551   BUN 162 (H) 03/04/2017 0551   CREATININE 34.27 (H) 03/04/2017 0551   CREATININE 3.16 (H) 01/05/2014 1459   CALCIUM 6.2 (LL) 03/04/2017 0551   CALCIUM 7.4 (L) 03/01/2017 0752   GFRNONAA 1 (L) 03/04/2017 0551   GFRAA 2 (L) 03/04/2017 0551   CBC    Component Value Date/Time   WBC 3.6 (L) 03/04/2017 0551   RBC 2.55 (L) 03/04/2017 0551   HGB 6.8 (LL) 03/04/2017 0551   HCT 21.0 (L) 03/04/2017 0551   PLT 172 03/04/2017 0551   MCV 82.4 03/04/2017 0551   MCH 26.7 03/04/2017 0551   MCHC 32.4 03/04/2017 0551   RDW 15.5 03/04/2017 0551   LYMPHSABS 1.0 12/21/2013 0510   MONOABS 0.1 12/21/2013 0510   EOSABS 0.0 12/21/2013 0510   BASOSABS 0.0 12/21/2013 0510      Assessment/Plan:  1. Progressive CKD now stage 5 not yet on dialysis- underlying CKD likely due to hypertensive nephrosclerosis. He continues to state that he does not want dialysis and he told the nurse that he thinks he can fix this by drinking alkaline water.  I was  quite frank with him that his only 2 options are dialysis or hospice.  He is having some asterixis and I feel it is time to start dialysis. I also appreciate the housestaff efforts to reason with him, however I agree with Palliative consult and hopefully this will drive the reality home to him and his wife.  Will keep him npo and wait for his aquiessence.  I fear that his uremia is impairing his ability to think clearly. 2. Hypertensive urgency- resolved with medications 3. Volume overload- diuresing 4. DM 5. Anemia of CKD stage 5- will check iron stores and start esa 6. SHPTH- with hyperphosphatemia- will need dietician and info on low phos diet and start phosphate binders. Follow iPTH 7. Disposition- will need vascular access and outpatient HD to be arranged before discharge. I stressed the importance of adherence to HD and medications. We discussed renal transplantation and that he will not survive holding off HD until he can get a kidney (waiting period is 3-6 years). We also discussed different modalities of RRT including HD and PD as well as home HD. He wants to think about it before he proceeds with access/dialysis. We also discussed the risks of not proceeding with dialysis, which include death. He voiced understanding.   Donetta Potts, MD [employer] [phone]

## 2017-03-05 NOTE — Discharge Summary (Signed)
 Name: Patrick Ibarra MRN: [id number] DOB: 10/01/1980 35 y.o. PCP: system,pcp not in  Date of Admission: 03/01/2017 12:24 AM Date of Discharge: 03/05/2017 Attending Physician: Butcher, Elizabeth A, MD  Discharge Diagnosis: End stage renal failure stage 5 Anemia of chronic renal disease Metabolic acidosis Volume overload Secondary hyperparathyroidism  Hypertensive urgency Reluctance to initiate dialysis  Discharge Medications: Allergies as of 03/05/2017   No Known Allergies     Medication List    STOP taking these medications   ACCU-CHEK AVIVA PLUS w/Device Kit   accu-chek softclix lancets   amLODipine 10 MG tablet Commonly known as:  NORVASC   captopril 25 MG tablet Commonly known as:  CAPOTEN   carvedilol 6.25 MG tablet Commonly known as:  COREG   glucose blood test strip Commonly known as:  ACCU-CHEK AVIVA   hydrALAZINE 25 MG tablet Commonly known as:  APRESOLINE   insulin aspart 100 UNIT/ML injection Commonly known as:  novoLOG   insulin glargine 100 UNIT/ML injection Commonly known as:  LANTUS   Insulin Syringes (Disposable) U-100 0.3 ML Misc   naproxen 500 MG tablet Commonly known as:  NAPROSYN     TAKE these medications   furosemide 80 MG tablet Commonly known as:  LASIX Take 1 tablet (80 mg total) by mouth 2 (two) times daily. What changed:  medication strength  how much to take  when to take this   labetalol 300 MG tablet Commonly known as:  NORMODYNE Take 1 tablet (300 mg total) by mouth 2 (two) times daily.       Disposition and follow-up:   Mr.Patrick Ibarra was discharged from [facility] in Critical condition.  At the hospital follow up visit please address:  1. Discuss reconsidering undergoing hemodialysis  2. Address Blood Pressure changes, monitor electrolytes  2.  Labs / imaging needed at time of follow-up: cbc, bmp,a1c  3.  Pending labs/ test needing follow-up: hemoglobin, hct  Follow-up  Appointments: Follow-up Information    [facility] Follow up on 03/11/2017.   Why:  at 9:30 to establish primary care physician. You may fill meds here when you are discharged.  Contact information: [address] [place] [address] [phone]          Hospital Course by problem list:   1) Hypertensive emergency Pt presented with a bp of 200/141 and reported bilateral leg swelling. He has not been managed for his longstanding htn and has not seen . During this admission he was initially managed with nitroglycerin. The goal was to achieve a map of 145-150 in the first hour and then titrate over the next 23 hours to achieve a goal map of 125-130. We were able to successfully decrease his bp and maintain him in normotensive range with amlodipine and labetolol. His blood pressure 24 hrs prior to discharge ranged 90-130/55-68. Due to him being slightly hypotensive we opted to discontinue amlodipine and continue only labetalol.     2) Hypertensive nephrosclerosis Pt's cr on admission was 31 with GFR of 2 and was grossly volume overloaded per exam. He has had longstanding elevated cr dating back to 2013 when it was 2.13 (12/24/1980). Protein creatine ratio= 1.86, urine creatinine= 86.79, urine total protein=16, Ur sodium=86. Nephrology was consulted and they deemed that he has adv ckd v that can only be managed with dialysis. Creatinine continued to trend up and was 34.27 on 03/04/17.  Pt refused dialysis throughout the admission despite the imts   team, nephrology consultants, and nursing staff expressing the risks of not having dialysis at this time. Pt expressed understanding and stated that he is not interested in trying dialysis at this time and would like to try alternative methods (alkaline water) instead. Palliative care was consulted to discuss goals of care and pt continued to deny hemodialysis stating that he would like to get aggressive  resuscitation and hemo-dialysis if he arrested.   3) Diabetes mellitus  He has had longstanding diabetes which was managed with insulin, but he has been off of medication for several years. His A1c was 5.8 and his blood glucose levels remained in normal range without antihyperglycemic medications. We can continue   4) CHF (congestive heart failure) (HCC) He was grossly volume overloaded on examination. Echo done on 03/02/17 reveals a LVEF of 60-65%, mildly dilated right ventricle, and mildly thickened mitral leaflets. BNP was elevated at 1812. There was no pulmonary edema per cxr. Pt was placed on 1200mL fluid restriction and was diuresing on lasix throughout admission. We were able to achieve a net I/O of -2.6L since admission.   5) Anemia of chronic kidney failure On admission pt's hb=7.4, hct=22.9,  mcv 83, rdw 15.7, iron 37, saturation ratios=13, tibc=245. Pt's anemia is likely due to his renal disease. Hemoglobin continued to trend low during admission but we opted not to manage since his anemia is due to his deteriorating renal function and any attempt to treat will only be symptomatic.   6) Health Maintenance: Hepatitis b antibody resulted negative. We were did not administer hep b vaccine in preparation for dialysis as pt refused dialysis.  Discharge Vitals:   BP 132/88 (BP Location: Right Arm)   Pulse 83   Temp 98 F (36.7 C) (Oral)   Resp 17   Ht 6' 1" (1.854 m)   Wt 267 lb 11.2 oz (121.4 kg)   SpO2 100%   BMI 35.32 kg/m   Pertinent Labs, Studies, and Procedures:  Iron and TIBC (03/04/17): iron=27, TIBC=245, saturation ratio=11, ferritin=119 Hgb A1C=5.8 (03/01/17)  Protein creatine ratio (03/02/27): 1.86, urine creatinine= 86.79, urine total protein=161 Ur sodium=86  Renal function panel (03/04/17)= na=132, k=3.8, cl=96, co2=16, glu=89, bun=162, cr=34.27, ph=10.2, alb=2.8  Urinalysis (03/01/17)    Ref Range & Units 6d ago (03/01/17)     Color, Urine YELLOW STRAW        APPearance CLEAR CLEAR      Specific Gravity, Urine 1.005 - 1.030 1.010      pH 5.0 - 8.0 5.0      Glucose, UA NEGATIVE mg/dL 50       Hgb urine dipstick NEGATIVE SMALL       Bilirubin Urine NEGATIVE NEGATIVE      Ketones, ur NEGATIVE mg/dL NEGATIVE      Protein, ur NEGATIVE mg/dL 100       Nitrite NEGATIVE NEGATIVE      Leukocytes, UA NEGATIVE NEGATIVE      RBC / HPF 0 - 5 RBC/hpf 0-5      WBC, UA 0 - 5 WBC/hpf 0-5      Bacteria, UA NONE SEEN NONE SEEN      Squamous Epithelial / LPF NONE SEEN NONE SEEN      Urobilinogen, UA        Echo (03/02/17)  Impressions:   - Compared to a prior study in 2015, the LVEF is higher at 60-65%. - Left ventricle: The cavity size was normal. Wall thickness was   increased in a pattern   of mild LVH. Systolic function was normal.   The estimated ejection fraction was in the range of 60% to 65%.   Wall motion was normal; there were no regional wall motion   abnormalities. Left ventricular diastolic function parameters   were normal. - Mitral valve: Mildly thickened leaflets . There was trivial   regurgitation. - Right ventricle: The cavity size was mildly dilated. Systolic   function was low normal. - Tricuspid valve: There was mild regurgitation. - Pulmonary arteries: PA peak pressure: 35 mm Hg (S). - Inferior vena cava: The vessel was dilated >2.1 cm, but collapses   >50%, suggesting an RA pressure of 8 mmHg. - Pericardium, extracardiac: A trivial pericardial effusion was   identified posterior to the heart.   Chest x-ray (03/01/17) IMPRESSION: New mildly enlarged cardiac silhouette. No consolidation or effusion.  US Renal (03/01/17) IMPRESSION: Atrophy and markedly echogenic renal parenchyma consistent with medical renal disease. No hydronephrosis.   Discharge Instructions: Discharge Instructions    Call MD for:  difficulty breathing, headache or visual disturbances    Complete by:  As directed    Call MD for:  extreme fatigue     Complete by:  As directed    Call MD for:  hives    Complete by:  As directed    Call MD for:  persistant dizziness or light-headedness    Complete by:  As directed    Call MD for:  persistant nausea and vomiting    Complete by:  As directed    Call MD for:  redness, tenderness, or signs of infection (pain, swelling, redness, odor or green/yellow discharge around incision site)    Complete by:  As directed    Call MD for:  severe uncontrolled pain    Complete by:  As directed    Call MD for:  temperature >100.4    Complete by:  As directed    Diet - low sodium heart healthy    Complete by:  As directed    Discharge instructions    Complete by:  As directed    Thank you for allowing us to be a part of your care. During this admission you were managed for hypertensive emergency and end stage kidney disease. Please be advised that you are electing to be discharged without dialysis having expressed understanding of the risks associated with this.   Complications of your condition include shortness of breath, chest pain, nausea and vomiting, light-headedness, generalized swelling, rapid weight gain, sudden cardiac arrest, etc. Please feel free to reach out to us should you have any questions or concerns and if you are experiencing a medical emergency please call 911 immediately.   Increase activity slowly    Complete by:  As directed       Signed: Vahini Chundi, MD Internal Medicine PGY1 Pager:[phone] 03/06/2017, 3:45 PM   

## 2017-03-05 NOTE — Consult Note (Signed)
Consultation Note Date: 03/05/2017   Patient Name: Patrick Ibarra  DOB: 1981/01/02  MRN: [id number]  Age / Sex: 36 y.o., male  PCP: System, Pcp Not In Referring Physician: Bartholomew Crews, MD  Reason for Consultation: Establishing goals of care  HPI/Patient Profile: 36 y.o. male  with past medical history of morbid obesity and hypertension who was admitted on 03/01/2017 with lower extremity swelling.  Initial work up revealed end stage kidney disease, volume overload and malignant hypertension.  Dialysis has been recommended as a bridge to transplant but thus far the patient and his wife have not agreed to move forward with HD.  Intake and output documentation indicate that his urine output is decreasing (500 ml in the last 24 hours).  Current BUN/Creatinine is 162/34.27.  GFR is 2.  Clinical Assessment and Goals of Care:  I have reviewed medical records including EPIC notes, labs and imaging, received report from the primary team, assessed the patient and then met at the bedside along with his wife on the telephone  to discuss diagnosis prognosis, GOC, EOL wishes, disposition and options.  I introduced Palliative Medicine as specialized medical care for people living with serious illness. It focuses on providing relief from the symptoms and stress of a serious illness. The goal is to improve quality of life for both the patient and the family.  We discussed a brief life review of the patient. He was raised in [place].  Went to [employer] and then [employer].  He works in Clinical cytogeneticist supplying parts.  His father died when he was approx 12 likely due to complications of alcoholism.  His mother is alive and living in [place].  He is married and has two step children.  His wife "[place]" is very supportive.  He is spiritual, believes in God and prays, but is not of a particular denomination.      I asked what he understood  of his health condition.  He understands that his kidneys are failing and that the doctors have recommended HD.  We discussed needing to utilize HD in order to one day have a kidney transplant.  [place] I[place] states he is not ready to commit to HD yet.  He asks about fistula placement and fistula removal.  We talked about his grandmother who had cancer and HD late in life.  HD seemed to sap her energy level and she declined while on it.  I expressed that the doctors anticipated he would still be able to work while on HD and that it would likely enable him to be able to have a transplant one day.  We discussed the function of the kidneys - filtering and fluid removal amongst other things.  Without good kidney function he is at risk for developing SOB from pulmonary edema, chest pain, confusion, seizures, cardiac arrest.  He understands.  His wife states that they know the symptoms to look for - increased swelling, confusion, shortness of breath.  She states that she wants her husband well and alive,  and that her job is to support him and what he wants.  She goes on to say that they will take all of the medications and will go to all of the follow up appointments.  Given that he is at risk for cardiac arrest or other acute event - we discussed advanced directives.  [place]ra would want his wife to be his Air traffic controller.  He feels his mother and the rest of his family would be on board with her decisions.  At this point if he arrested he would want aggressive resuscitation including hemo-dialysis.  I asked if he would want life support that could go on for days to weeks.  [place] I[place]ra appropriately asked well, how long does it take to determine whether or not you're going to make it - a week?  We then talked about some of the decisions his wife would potentially have to make including trach, PEG, living in a vent/snf. We then reviewed a MOST form and discussed whether he would ever want a feeding tube.  [place] I[place]ra was  thoughtful.  He had never considered many of these questions before.  He stated that he appreciated the conversation - and he felt that all families should have this type of conversation because everyone eventually dies.  I left him with a "Hard Choices" booklet and asked him to review it with his wife.    [place] and his wife expressed that they wanted to try some other therapies before HD.  I encouraged them to try them simultaneously - he could always stop HD if he improved.  I expressed that if his kidneys improve without HD it will truly be a miracle.  He wife commented that that was what they wanted - the miracle.  Questions and concerns were addressed.  Hard Choices booklet left for review. The family was encouraged to call with questions or concerns.    After leaving and discussing the case with my attending physician, Dr. Domingo Ibarra, he spoke with [facility] and his wife as well.  He offered support.  Encouraged them to consider moving forward with HD sooner rather than later. He re-emphasized that we want to support [place] I[place]ra and his health, and made certain that they had our contact information to call with any questions that may arise after discharge.  Primary Decision Maker:  PATIENT    SUMMARY OF RECOMMENDATIONS    D/C from the hospital with close outpatient follow up. Patient and his wife have PMT contact information to call with any concerns or questions. Significant encouragement offered from PMT to start HD now.  Patient is aware of risk of dyspnea, confusion, seizure, cardiac arrest and death.  Code Status/Advance Care Planning:  Full code    Symptom Management:   Per primary team.   Prognosis:   < 4 weeks without HD  Discharge Planning: Home with Home Health      Primary Diagnoses: Present on Admission: . Hypertensive emergency . Renal failure (ARF), acute on chronic (HCC) . Obesity . Anemia of chronic kidney failure   I have reviewed the medical record, interviewed  the patient and family, and examined the patient. The following aspects are pertinent.  Past Medical History:  Diagnosis Date  . Hypertension   . LV dysfunction   . Obesity   . Uncontrolled diabetes mellitus ([place])    Social History   Social History  . Marital status: Married    Spouse name: N/A  . Number of children: N/A  . Years of education:  N/A   Social History Main Topics  . Smoking status: Never Smoker  . Smokeless tobacco: Never Used  . Alcohol use Yes     Comment: WINE ONCE A WEEK  . Drug use: No  . Sexual activity: Not Asked   Other Topics Concern  . None   Social History Narrative   He is married with step kids. He works at [employer].    Family History  Problem Relation Age of Onset  . Hypertension Mother   . Diabetes Mother   . Hypertension Father    Scheduled Meds: . amLODipine  5 mg Oral Daily  . aspirin EC  81 mg Oral Daily  . calcium acetate  667 mg Oral TID WC  . heparin  5,000 Units Subcutaneous Q8H  . [START ON 03/06/2017] Hepatitis B Vac Recombinant  40 mcg Injection Once  . labetalol  300 mg Oral BID  . sodium chloride flush  3 mL Intravenous Q12H  . sodium chloride flush  3 mL Intravenous Q12H   Continuous Infusions: . sodium chloride    . furosemide Stopped (03/05/17 0734)   PRN Meds:.sodium chloride, sodium chloride flush No Known Allergies Review of Systems complaints of leg swelling and dyspnea on exertion.  Patient has also had significant weight loss.  He describes a sedentary life style.  Physical Exam  Well developed, pleasant, cooperative, coherent, A&O in no distress  Vital Signs: BP 132/88 (BP Location: Right Arm)   Pulse 83   Temp 98 F (36.7 C) (Oral)   Resp 17   Ht _0  (1.854 m)   Wt 121.4 kg (267 lb 11.2 oz)   SpO2 100%   BMI 35.32 kg/m  Pain Assessment: No/denies pain   Pain Score: 0-No pain   SpO2: SpO2: 100 % O2 Device:SpO2: 100 % O2 Flow Rate: .   IO: Intake/output summary:  Intake/Output  Summary (Last 24 hours) at 03/05/17 1143 Last data filed at 03/05/17 3267  Gross per 24 hour  Intake              186 ml  Output              750 ml  Net             -564 ml    LBM: Last BM Date: 03/03/17 Baseline Weight: Weight: 119.7 kg (264 lb) Most recent weight: Weight: 121.4 kg (267 lb 11.2 oz)     Palliative Assessment/Data:   Flowsheet Rows     Most Recent Value  Intake Tab  Referral Department  -- [internal medicine]  Unit at Time of Referral  Med/Surg Unit  Palliative Care Primary Diagnosis  Nephrology  Date Notified  03/04/17  Palliative Care Type  New Palliative care  Reason for referral  Clarify Goals of Care  Date of Admission  03/01/17  # of days IP prior to Palliative referral  3  Clinical Assessment  Psychosocial & Spiritual Assessment  Palliative Care Outcomes      Time In: 11:00 Time Out: 12:10 Time Total: 70 min. Greater than 50%  of this time was spent counseling and coordinating care related to the above assessment and plan.  Signed by: Florentina Jenny, PA-C Palliative Medicine Pager: [phone]  Please contact Palliative Medicine Team phone at [phone] for questions and concerns.  For individual provider: See Shea Evans

## 2017-03-06 ENCOUNTER — Telehealth: Payer: Self-pay

## 2017-03-06 LAB — FOLATE RBC
Folate, Hemolysate: 232.9 ng/mL
Folate, RBC: 1125 ng/mL (ref >498)
Hematocrit: 20.7 % — ABNORMAL LOW (ref 37.5–51.0)

## 2017-03-06 NOTE — Telephone Encounter (Signed)
Hospital TOC per Dr Maricela Bo, discharge 03/05/2017 appt 03/07/2017.

## 2017-03-07 ENCOUNTER — Ambulatory Visit: Payer: Medicaid Other

## 2017-03-07 NOTE — Telephone Encounter (Signed)
Called to complete TCM, caregiver answered phone, stated he is going to [facility] and not coming to [facility], states he has his appt and will speak to [facility] if he needs anything. Call ended

## 2017-03-11 ENCOUNTER — Ambulatory Visit: Payer: Medicaid Other | Attending: Internal Medicine | Admitting: Physician Assistant

## 2017-03-11 ENCOUNTER — Encounter: Payer: Self-pay | Admitting: Physician Assistant

## 2017-03-11 VITALS — BP 136/83 | HR 73 | Temp 98.1°F | Resp 16 | Wt 259.6 lb

## 2017-03-11 DIAGNOSIS — I120 Hypertensive chronic kidney disease with stage 5 chronic kidney disease or end stage renal disease: Secondary | ICD-10-CM | POA: Insufficient documentation

## 2017-03-11 DIAGNOSIS — E669 Obesity, unspecified: Secondary | ICD-10-CM | POA: Diagnosis not present

## 2017-03-11 DIAGNOSIS — I150 Renovascular hypertension: Secondary | ICD-10-CM

## 2017-03-11 DIAGNOSIS — D631 Anemia in chronic kidney disease: Secondary | ICD-10-CM | POA: Diagnosis not present

## 2017-03-11 DIAGNOSIS — Z833 Family history of diabetes mellitus: Secondary | ICD-10-CM | POA: Diagnosis not present

## 2017-03-11 DIAGNOSIS — E1165 Type 2 diabetes mellitus with hyperglycemia: Secondary | ICD-10-CM | POA: Diagnosis not present

## 2017-03-11 DIAGNOSIS — N186 End stage renal disease: Secondary | ICD-10-CM | POA: Diagnosis present

## 2017-03-11 DIAGNOSIS — E118 Type 2 diabetes mellitus with unspecified complications: Secondary | ICD-10-CM

## 2017-03-11 DIAGNOSIS — Z8249 Family history of ischemic heart disease and other diseases of the circulatory system: Secondary | ICD-10-CM | POA: Insufficient documentation

## 2017-03-11 DIAGNOSIS — Z794 Long term (current) use of insulin: Secondary | ICD-10-CM | POA: Insufficient documentation

## 2017-03-11 DIAGNOSIS — E1122 Type 2 diabetes mellitus with diabetic chronic kidney disease: Secondary | ICD-10-CM | POA: Diagnosis present

## 2017-03-11 MED ORDER — LABETALOL HCL 300 MG PO TABS: 300.0000 mg | ORAL_TABLET | Freq: Two times a day (BID) | ORAL | 1 refills | Status: DC

## 2017-03-11 MED ORDER — FUROSEMIDE 80 MG PO TABS: 80.0000 mg | ORAL_TABLET | Freq: Two times a day (BID) | ORAL | 1 refills | Status: DC

## 2017-03-11 NOTE — Progress Notes (Signed)
[place]  AYT:[id number]  [id number]:[id number]  DOB - 1980/10/21  Chief Complaint  Patient presents with  . Hospitalization Follow-up       Subjective:   [place] Ibarra is a 36 y.o. male here today for establishment of care. He has a past medical history of hypertension and diabetes mellitus type 2. He also has been seen by cardiology in the past for left ventricular hypertrophy. He has chronic kidney disease and presented to the hospital with increasing leg swelling laterally, left jaw discomfort, and dyspnea with minimal exertion. On presentation his systolic blood pressure was 204. His heart rate was 115. Lab work revealed a creatinine of 31. BUNs 158. Hemoglobin 7.6. Troponin was 0.10. He was admitted by the internal medicine team with consultations from nephrology. He declined hemodialysis. Palliative team consultation was obtained as well. He was discharged home on antihypertensives and a loop diuretic. He has been compliant with his medications.  In speaking to him further there are spiritual concerns that he has in regards to going on hemodialysis. He is willing to do dialysis just wants to get some things in order prior to partaking.  He has not put timeframe on this. He is ready to go back to work. No chest pain. Breathing is improved. Still swells in his legs. Nonsmoker.  ROS: GEN: denies fever or chills, denies change in weight Skin: denies lesions or rashes HEENT: denies headache, earache, epistaxis, sore throat, or neck pain LUNGS: + SHOB, + dyspnea, PND, orthopnea CV: denies CP or palpitations ABD: denies abd pain, N or V EXT: denies muscle spasms + swelling; no pain in lower ext, no weakness NEURO: denies numbness or tingling, denies sz, stroke or TIA  ALLERGIES: No Known Allergies  PAST MEDICAL HISTORY: Past Medical History:  Diagnosis Date  . Hypertension   . LV dysfunction   . Obesity   . Uncontrolled diabetes mellitus ([place])     PAST SURGICAL HISTORY: Past  Surgical History:  Procedure Laterality Date  . TONSILLECTOMY AND ADENOIDECTOMY      MEDICATIONS AT HOME: Prior to Admission medications   Medication Sig Start Date End Date Taking? Authorizing Provider  furosemide (LASIX) 80 MG tablet Take 1 tablet (80 mg total) by mouth 2 (two) times daily. 04/04/17 05/04/17  Brayton Caves, PA-C  labetalol (NORMODYNE) 300 MG tablet Take 1 tablet (300 mg total) by mouth 2 (two) times daily. 04/04/17   Brayton Caves, PA-C    Family History  Problem Relation Age of Onset  . Hypertension Mother   . Diabetes Mother   . Hypertension Father    Social-Married. Children. Nonsmoker. Works.   Objective:   Vitals:   03/11/17 0936  BP: 136/83  Pulse: 73  Resp: 16  Temp: 98.1 F (36.7 C)  TempSrc: Oral  SpO2: 100%  Weight: 259 lb 9.6 oz (117.8 kg)    Exam General appearance : Awake, alert, not in any distress. Speech Clear. Not toxic looking Chest:Good air entry bilaterally, no added sounds  CVS: S1 S2 regular, no murmurs.  Abdomen: Bowel sounds present, Non tender and not distended with no guarding, rigidity or rebound. Extremities: B/L Lower Ext shows 2+ edema, both legs are warm to touch Neurology: Awake alert, and oriented X 3, CN II-XII intact, Non focal Skin-no lesions or rashes  Data Review Lab Results  Component Value Date   HGBA1C 5.8 (H) 03/01/2017   HGBA1C 7.5 (H) 12/21/2013   HGBA1C 7.6 (H) 12/21/2013     Assessment &  Plan  1. ESRD, not on HD  -Cont loop diuretic  -HD being contemplated  -not sure if he is on the transplant list  -1 week BMP and CBC  -renal diet 2. HTN-improved  -cont Labetalol, Lasix   -low salt diet  -labs in 1 week 3. DM2-stable off meds/insulin  -low carb diet 4. Anemia of CKD  -recheck CBC 1 week   Return in about 1 week (around 03/18/2017).CBC and BMP in 1 week.  The patient was given clear instructions to go to ER or return to medical center if symptoms don't improve, worsen or new problems  develop. The patient verbalized understanding. The patient was told to call to get lab results if they haven't heard anything in the next week.   Total time spent with patient was 27 min. Greater than 50% of this visit was spent face to face counseling and coordinating care regarding risk factor modification, compliance importance and encouragement, education related to renal failure and high blood pressure.  This note has been created with Surveyor, quantity. Any transcriptional errors are unintentional.    Zettie Pho, PA-C [facility] [place], [place]   03/11/2017, 10:16 AM

## 2017-03-11 NOTE — Progress Notes (Signed)
Pt states since he has started taking the medicine he has experience dizziness, more tired and having weird dreams

## 2017-03-18 ENCOUNTER — Ambulatory Visit: Payer: Medicaid Other | Attending: Internal Medicine | Admitting: Internal Medicine

## 2017-03-18 ENCOUNTER — Telehealth: Payer: Self-pay | Admitting: Internal Medicine

## 2017-03-18 ENCOUNTER — Encounter: Payer: Self-pay | Admitting: Internal Medicine

## 2017-03-18 VITALS — BP 145/91 | HR 76 | Temp 98.3°F | Resp 16 | Wt 250.6 lb

## 2017-03-18 DIAGNOSIS — D631 Anemia in chronic kidney disease: Secondary | ICD-10-CM | POA: Insufficient documentation

## 2017-03-18 DIAGNOSIS — E1122 Type 2 diabetes mellitus with diabetic chronic kidney disease: Secondary | ICD-10-CM | POA: Diagnosis not present

## 2017-03-18 DIAGNOSIS — N186 End stage renal disease: Secondary | ICD-10-CM | POA: Diagnosis not present

## 2017-03-18 DIAGNOSIS — E118 Type 2 diabetes mellitus with unspecified complications: Secondary | ICD-10-CM

## 2017-03-18 DIAGNOSIS — N179 Acute kidney failure, unspecified: Secondary | ICD-10-CM | POA: Diagnosis not present

## 2017-03-18 DIAGNOSIS — N185 Chronic kidney disease, stage 5: Secondary | ICD-10-CM

## 2017-03-18 DIAGNOSIS — I509 Heart failure, unspecified: Secondary | ICD-10-CM | POA: Diagnosis not present

## 2017-03-18 DIAGNOSIS — L299 Pruritus, unspecified: Secondary | ICD-10-CM | POA: Diagnosis not present

## 2017-03-18 DIAGNOSIS — Z8249 Family history of ischemic heart disease and other diseases of the circulatory system: Secondary | ICD-10-CM | POA: Diagnosis not present

## 2017-03-18 DIAGNOSIS — Z6833 Body mass index (BMI) 33.0-33.9, adult: Secondary | ICD-10-CM | POA: Diagnosis not present

## 2017-03-18 DIAGNOSIS — Z833 Family history of diabetes mellitus: Secondary | ICD-10-CM | POA: Insufficient documentation

## 2017-03-18 DIAGNOSIS — Z9889 Other specified postprocedural states: Secondary | ICD-10-CM | POA: Insufficient documentation

## 2017-03-18 DIAGNOSIS — I132 Hypertensive heart and chronic kidney disease with heart failure and with stage 5 chronic kidney disease, or end stage renal disease: Secondary | ICD-10-CM | POA: Diagnosis not present

## 2017-03-18 DIAGNOSIS — E669 Obesity, unspecified: Secondary | ICD-10-CM | POA: Diagnosis not present

## 2017-03-18 DIAGNOSIS — I120 Hypertensive chronic kidney disease with stage 5 chronic kidney disease or end stage renal disease: Secondary | ICD-10-CM

## 2017-03-18 MED ORDER — GLUCOSE BLOOD VI STRP: ORAL_STRIP | 12 refills | Status: DC

## 2017-03-18 MED ORDER — AMLODIPINE BESYLATE 5 MG PO TABS: 2.5000 mg | ORAL_TABLET | Freq: Every day | ORAL | 3 refills | Status: DC

## 2017-03-18 MED ORDER — TRUE METRIX METER W/DEVICE KIT: PACK | 0 refills | Status: DC

## 2017-03-18 MED ORDER — TRUEPLUS LANCETS 28G MISC: 1 refills | Status: DC

## 2017-03-18 MED ORDER — TRIAMCINOLONE ACETONIDE 0.1 % EX CREA: 1.0000 "application " | TOPICAL_CREAM | Freq: Two times a day (BID) | CUTANEOUS | 1 refills | Status: DC

## 2017-03-18 MED FILL — TRUE METRIX TEST STRIP: 25 days supply | Qty: 100 | Fill #0

## 2017-03-18 MED FILL — TRUE METRIX BLOOD GLUCOSE M: W/DEVICE | 365 days supply | Qty: 1 | Fill #0

## 2017-03-18 MED FILL — TRIAMCINOLONE 0.1% CREAM: 0.1 | 30 days supply | Qty: 80 | Fill #0

## 2017-03-18 MED FILL — TRUEplus LANCETS 28G MISC: 25 days supply | Qty: 100 | Fill #0

## 2017-03-18 MED FILL — AMLODIPINE BESYLATE 5 MG TA: 5 | 30 days supply | Qty: 15 | Fill #0

## 2017-03-18 NOTE — Progress Notes (Addendum)
Patient ID: Patrick Ibarra, male    DOB: 1981/08/17  MRN: [id number]  CC: Establish Care; Hypertension; and Chronic Kidney Disease   Subjective: Patrick Ibarra is a 36 y.o. male who presents to become est with me as PCP.  Wife ,Leane Platt, is with him His concerns today include:  36 year old with history of end-stage renal disease refusing HD, sec hyperparathyroidism, hypertension, diabetes managed off of the medications, anemia of chronic disease (Hb down to 6.8 on dischg from hosp but was not transfused due to risk of worsen fluid over load in pt refusing HD), obesity, CHF with EF 60-65%.  Recently seen by our PA post hospitalization with hypertensive urgency with volume overload. Patient needing HD but refusing.  1. C/o intermittent generalized itching x 1 wk. Especially at nights -wonders if new meds causing. From review of his records he has been on furosemide back in 2015 so the only new medication is labetalol -no new body products. -no other family members affected  2. ESRD/HTN: -not checking BP, no device -has not seen nephrologist since dischg from hospital -he is still not wanting HD and his headache about things that he needs to do first.  Still contemplating. -SOB "Only when I run or jog or try to move a little faster than normal." -+ LE edema which has dec since hosp -does not smoke. Not drinking as much. Last drank a few mths.  Was drinking heavy in past  3. Anemia -endorses fatigue and intermittent dizziness.  More so since being on meds -furosemide on labetalol  4.  DM no meter to check blood sugars   Patient Active Problem List   Diagnosis Date Noted  . Hypertensive emergency 03/01/2017  . CHF (congestive heart failure) ([place]) 03/01/2017  . Obesity 03/01/2017  . Anemia of chronic kidney failure 03/01/2017  . Essential hypertension 01/05/2014  . Wheezing 12/21/2013  . Renal failure (ARF), acute on chronic (HCC) 12/20/2013  . Diabetes mellitus ([place]) 12/24/2011  .  Chronic renal insufficiency, stage IV (severe) ([place]) 12/24/2011     Current Outpatient Prescriptions on File Prior to Visit  Medication Sig Dispense Refill  . [START ON 04/04/2017] furosemide (LASIX) 80 MG tablet Take 1 tablet (80 mg total) by mouth 2 (two) times daily. 60 tablet 1  . [START ON 04/04/2017] labetalol (NORMODYNE) 300 MG tablet Take 1 tablet (300 mg total) by mouth 2 (two) times daily. 60 tablet 1   No current facility-administered medications on file prior to visit.     No Known Allergies  Social History   Social History  . Marital status: Married    Spouse name: N/A  . Number of children: N/A  . Years of education: N/A   Occupational History  . Not on file.   Social History Main Topics  . Smoking status: Never Smoker  . Smokeless tobacco: Never Used  . Alcohol use Yes     Comment: WINE ONCE A WEEK  . Drug use: No  . Sexual activity: Not on file   Other Topics Concern  . Not on file   Social History Narrative   He is married with step kids. He works at [employer], [employer]/product.     Family History  Problem Relation Age of Onset  . Hypertension Mother   . Diabetes Mother   . Hypertension Father     Past Surgical History:  Procedure Laterality Date  . TONSILLECTOMY AND ADENOIDECTOMY      ROS: Review of Systems Negative except as stated  above PHYSICAL EXAM: BP (!) 145/91   Pulse 76   Temp 98.3 F (36.8 C) (Oral)   Resp 16   Wt 250 lb 9.6 oz (113.7 kg)   SpO2 95%   BMI 33.06 kg/m   Repeat BP 160/100 Wt Readings from Last 3 Encounters:  03/18/17 250 lb 9.6 oz (113.7 kg)  03/11/17 259 lb 9.6 oz (117.8 kg)  03/04/17 267 lb 11.2 oz (121.4 kg)    Physical Exam General appearance - alert, well appearing, Young African-American male and in no distress Mental status - alert, oriented to person, place, and time. Slow in thought process but appropriate Mouth - moist oral mucosa Neck - supple, no significant adenopathy Chest - clear to auscultation, no  wheezes, rales or rhonchi, symmetric air entry Heart - normal rate, regular rhythm, normal S1, S2, no murmurs, rubs, clicks or gallops Extremities - trace  To 1 + LE nad 1 + pedal edema Skin - excoriated marks on arms   ASSESSMENT AND PLAN: 1. End stage renal disease ([place]) -Patient very vague about his reasons for not wanting to do hemodialysis at this time. I did not push the issue. We will refer to nephrology for Arenesp or Procrit shots.  He has applied for Medicaid. In the meantime I recommend applying for the [facility] card - Comprehensive metabolic panel - CBC - Ambulatory referral to Nephrology  2. Hypertensive nephrosclerosis, stage 5 chronic kidney disease or end stage renal disease (HCC) -Not at goal. Add low-dose Norvasc. -Low salt diet recommended. Do not take NSAIDs. - amLODipine (NORVASC) 5 MG tablet; Take 0.5 tablets (2.5 mg total) by mouth daily.  Dispense: 30 tablet; Refill: 3  3. Anemia in stage 5 chronic kidney disease, not on chronic dialysis [facility]) -Patient advised that some of his symptoms due to anemia which stems from his kidney problem. Discussed with him that he will  need Aricept and possibly blood transfusion even with dilemma of possible fluid overload. However he is down 17 pounds since hospitalization. He prefers Aricept. Refer to nephrology. Check CBC today  4. Itching -DDX include due to Labetalol vs azotemia.  I think the latter is most likely - triamcinolone cream (KENALOG) 0.1 %; Apply 1 application topically 2 (two) times daily.  Dispense: 80 g; Refill: 1  5. Type 2 diabetes mellitus with complication, without long-term current use of insulin (HCC) -Given prescription from meter and strips. Patient to check blood sugars several times a week. - Blood Glucose Monitoring Suppl (TRUE METRIX METER) w/Device KIT; Use as directed  Dispense: 1 kit; Refill: 0 - glucose blood (TRUE METRIX BLOOD GLUCOSE TEST) test strip; Use as instructed  Dispense: 100 each; Refill:  12 - TRUEPLUS LANCETS 28G MISC; Use as directed  Dispense: 100 each; Refill: 1   Patient was given the opportunity to ask questions.  Patient verbalized understanding of the plan and was able to repeat key elements of the plan.   Orders Placed This Encounter  Procedures  . Comprehensive metabolic panel  . CBC  . Ambulatory referral to Nephrology     Requested Prescriptions   Signed Prescriptions Disp Refills  . Blood Glucose Monitoring Suppl (TRUE METRIX METER) w/Device KIT 1 kit 0    Sig: Use as directed  . glucose blood (TRUE METRIX BLOOD GLUCOSE TEST) test strip 100 each 12    Sig: Use as instructed  . TRUEPLUS LANCETS 28G MISC 100 each 1    Sig: Use as directed  . triamcinolone cream (KENALOG) 0.1 %  80 g 1    Sig: Apply 1 application topically 2 (two) times daily.  Marland Kitchen amLODipine (NORVASC) 5 MG tablet 30 tablet 3    Sig: Take 0.5 tablets (2.5 mg total) by mouth daily.    Return in about 8 weeks (around 05/13/2017).  Karle Plumber, MD, FACP

## 2017-03-18 NOTE — Patient Instructions (Signed)
Check your blood pressure 2-3 times a week.  Goal is 130/85 or lower. You have been referred to a kidney specialist.  Your itching may be due to your kidney disease.  You are very anemic and may need blood transfusion or intermittent shot to bring your blood count up and get you feeling a little better.

## 2017-03-19 ENCOUNTER — Telehealth: Payer: Self-pay | Admitting: Internal Medicine

## 2017-03-19 ENCOUNTER — Encounter (HOSPITAL_COMMUNITY): Payer: Self-pay | Admitting: *Deleted

## 2017-03-19 ENCOUNTER — Telehealth: Payer: Self-pay

## 2017-03-19 ENCOUNTER — Telehealth: Payer: Self-pay | Admitting: Family Medicine

## 2017-03-19 ENCOUNTER — Emergency Department (HOSPITAL_COMMUNITY)
Admission: EM | Admit: 2017-03-19 | Discharge: 2017-03-19 | Discharge status: Against Medical Advice | Payer: Medicaid Other | Attending: Emergency Medicine | Admitting: Emergency Medicine

## 2017-03-19 DIAGNOSIS — Z5320 Procedure and treatment not carried out because of patient's decision for unspecified reasons: Secondary | ICD-10-CM | POA: Diagnosis present

## 2017-03-19 DIAGNOSIS — N186 End stage renal disease: Secondary | ICD-10-CM

## 2017-03-19 DIAGNOSIS — E872 Acidosis, unspecified: Secondary | ICD-10-CM

## 2017-03-19 LAB — CBC
HEMATOCRIT: 20.4 % — AB (ref 39.0–52.0)
HEMOGLOBIN: 6.8 g/dL — AB (ref 13.0–17.0)
Hematocrit: 21.8 % — ABNORMAL LOW (ref 37.5–51.0)
Hemoglobin: 7.3 g/dL — ABNORMAL LOW (ref 13.0–17.7)
MCH: 27.1 pg (ref 26.6–33.0)
MCH: 26.6 pg (ref 26.0–34.0)
MCHC: 33.5 g/dL (ref 31.5–35.7)
MCHC: 33.3 g/dL (ref 30.0–36.0)
MCV: 81 fL (ref 79–97)
MCV: 79.7 fL (ref 78.0–100.0)
PLATELETS: 143 10*3/uL — AB (ref 150–379)
PLATELETS: 119 10*3/uL — AB (ref 150–400)
RBC: 2.69 x10E6/uL — CL (ref 4.14–5.80)
RBC: 2.56 MIL/uL — AB (ref 4.22–5.81)
RDW: 17 % — ABNORMAL HIGH (ref 12.3–15.4)
RDW: 15.7 % — ABNORMAL HIGH (ref 11.5–15.5)
WBC: 5.4 10*3/uL (ref 3.4–10.8)
WBC: 5.1 10*3/uL (ref 4.0–10.5)

## 2017-03-19 LAB — COMPREHENSIVE METABOLIC PANEL
A/G RATIO: 1.2 (ref 1.2–2.2)
ALBUMIN: 4 g/dL (ref 3.5–5.5)
ALT: 5 IU/L (ref 0–44)
ALT: 8 U/L — ABNORMAL LOW (ref 17–63)
AST: 6 IU/L (ref 0–40)
AST: 9 U/L — ABNORMAL LOW (ref 15–41)
Albumin: 3.2 g/dL — ABNORMAL LOW (ref 3.5–5.0)
Alkaline Phosphatase: 102 IU/L (ref 39–117)
Alkaline Phosphatase: 84 U/L (ref 38–126)
Anion gap: 25 — ABNORMAL HIGH (ref 5–15)
BUN/Creatinine Ratio: 4 — ABNORMAL LOW (ref 9–20)
BUN: 135 mg/dL (ref 6–20)
BUN: 163 mg/dL — ABNORMAL HIGH (ref 6–20)
Bilirubin Total: 0.3 mg/dL (ref 0.0–1.2)
CALCIUM: 5.9 mg/dL — AB (ref 8.7–10.2)
CHLORIDE: 95 mmol/L — AB (ref 101–111)
CO2: 13 mmol/L — AB (ref 20–29)
CO2: 12 mmol/L — AB (ref 22–32)
CREATININE: 36.26 mg/dL — AB (ref 0.76–1.27)
CREATININE: 37.03 mg/dL — AB (ref 0.61–1.24)
Calcium: 5.7 mg/dL — CL (ref 8.9–10.3)
Chloride: 89 mmol/L — ABNORMAL LOW (ref 96–106)
GFR calc Af Amer: 1 mL/min/{1.73_m2} — ABNORMAL LOW (ref >59)
GFR, EST AFRICAN AMERICAN: 1 mL/min — AB (ref >60)
GFR, EST NON AFRICAN AMERICAN: 1 mL/min/{1.73_m2} — AB (ref >59)
GFR, EST NON AFRICAN AMERICAN: 1 mL/min — AB (ref >60)
GLOBULIN, TOTAL: 3.4 g/dL (ref 1.5–4.5)
Glucose, Bld: 82 mg/dL (ref 65–99)
Glucose: 69 mg/dL (ref 65–99)
POTASSIUM: 4.4 mmol/L (ref 3.5–5.2)
POTASSIUM: 3.9 mmol/L (ref 3.5–5.1)
SODIUM: 134 mmol/L (ref 134–144)
SODIUM: 132 mmol/L — AB (ref 135–145)
Total Bilirubin: 1 mg/dL (ref 0.3–1.2)
Total Protein: 7.4 g/dL (ref 6.0–8.5)
Total Protein: 6.7 g/dL (ref 6.5–8.1)

## 2017-03-19 MED ORDER — CALCITRIOL 0.25 MCG PO CAPS: 0.2500 ug | ORAL_CAPSULE | Freq: Every day | ORAL | 1 refills | Status: DC

## 2017-03-19 MED ORDER — CALCIUM CARBONATE ANTACID 500 MG PO CHEW: CHEWABLE_TABLET | ORAL | 1 refills | Status: DC

## 2017-03-19 MED ORDER — SODIUM BICARBONATE 650 MG PO TABS: 650.0000 mg | ORAL_TABLET | Freq: Three times a day (TID) | ORAL | 0 refills | Status: DC

## 2017-03-19 MED FILL — CALCITRIOL 0.25 MCG CAPSULE: 0.25 | 30 days supply | Qty: 30 | Fill #0

## 2017-03-19 NOTE — ED Notes (Signed)
Pt leaving AMA. Dr. Alvino Chapel already spoke with pt about leaving.

## 2017-03-19 NOTE — ED Triage Notes (Signed)
Pt had recent hospitalization for renal failure. Reports feeling fine, had bloodwork done yesterday for followup and was told to come here today due to elevated renal function. Pt has not yet started dialysis. VSS, no complaints.

## 2017-03-19 NOTE — Telephone Encounter (Signed)
PC placed to pt today after speaking with the covering on-call physician and noting pt's lab results. She advised pt to be seen in ER based on Cr level. However, pt has know ESRD and has refused HD.  I left a message on voicemail asking him to return my call so that I can go over results with him. Anemia is not any worse. Cr still in 62s and he is more acidotic.  I would like to start him on TUMS, Calcitriol and Bicarb if he does not go to hosp to start HD.  Rxns sent to [place].

## 2017-03-19 NOTE — ED Notes (Signed)
Pt's wife approached this EMT asking why they hadn't been told the lab results yet. She was informed that the lab work can only be provided by the MD/provider. Wife made aware of wait time.

## 2017-03-19 NOTE — ED Notes (Signed)
Date and time results received: 03/19/17 2:51 PM  Test:Critical Value: Calcium 5.7 Name of Provider Notified: Dr Dayna Barker  Orders Received? Or Actions Taken?:EKG ordered, increased pt acuity

## 2017-03-19 NOTE — Discharge Instructions (Signed)
You are leaving against our advice. Take the medicines that were prescribed to you. Follow-up with nephrology as soon as possible.

## 2017-03-19 NOTE — ED Notes (Signed)
Date and time results received: 03/19/17 2:20 PM   Test: hemoglobin Critical Value: 6.8  Name of Provider Notified: Dr Dayna Barker  Orders Received? Or Actions Taken?:no further actions at this time

## 2017-03-19 NOTE — Telephone Encounter (Signed)
error 

## 2017-03-19 NOTE — Telephone Encounter (Signed)
Received a call from after hours line patient with critical labs BUN 135, Cr 36.26 Calcium 5.7 RBC 2.69  patient has known end stage renal disease  Last BUN/Creatine was 162/34.27 on 03/04/17 He has been refusing hemodialysis per last OV note  Called to patient Left VM with critical lab results and instructions to go directly to ED for repeat labs, likely hospitalization and hemodialysis.

## 2017-03-19 NOTE — Telephone Encounter (Signed)
Contacted pt to make him aware of results and to inform him that he needs to go to the ED for repeat labs pt didn't answer lvm asking pt to give me a call at his earliest convenience

## 2017-03-19 NOTE — Telephone Encounter (Signed)
Pt wife returned call and is aware of lab results and will be going to the hospital for repeat labs

## 2017-03-20 ENCOUNTER — Telehealth: Payer: Self-pay | Admitting: Internal Medicine

## 2017-03-20 NOTE — Telephone Encounter (Signed)
PC placed to pt to f/u from yesterday. Left message on voice mail that I was calling to f/u and go over lab results. He has a UC appt with me tomorrow so will plan to discuss then.

## 2017-03-21 ENCOUNTER — Ambulatory Visit: Payer: Medicaid Other | Attending: Internal Medicine | Admitting: Internal Medicine

## 2017-03-21 ENCOUNTER — Encounter: Payer: Self-pay | Admitting: Internal Medicine

## 2017-03-21 VITALS — BP 118/74 | HR 73 | Temp 97.8°F | Resp 16 | Wt 245.0 lb

## 2017-03-21 DIAGNOSIS — N186 End stage renal disease: Secondary | ICD-10-CM | POA: Diagnosis present

## 2017-03-21 DIAGNOSIS — Z79899 Other long term (current) drug therapy: Secondary | ICD-10-CM | POA: Diagnosis not present

## 2017-03-21 DIAGNOSIS — E1122 Type 2 diabetes mellitus with diabetic chronic kidney disease: Secondary | ICD-10-CM | POA: Diagnosis not present

## 2017-03-21 DIAGNOSIS — D638 Anemia in other chronic diseases classified elsewhere: Secondary | ICD-10-CM

## 2017-03-21 DIAGNOSIS — I132 Hypertensive heart and chronic kidney disease with heart failure and with stage 5 chronic kidney disease, or end stage renal disease: Secondary | ICD-10-CM | POA: Diagnosis not present

## 2017-03-21 MED ORDER — FERROUS SULFATE 325 (65 FE) MG PO TABS: 325.0000 mg | ORAL_TABLET | Freq: Every day | ORAL | 3 refills | Status: DC

## 2017-03-21 MED FILL — FERROUS SULFATE 325 MG TAB: 325 (65 FE) | 30 days supply | Qty: 30 | Fill #0

## 2017-03-21 NOTE — Patient Instructions (Signed)
Please give pt form for [facility] Card/[facility] discount and appointment with Ms. Luciana Axe.

## 2017-03-21 NOTE — Progress Notes (Signed)
Patient ID: Patrick Ibarra, male    DOB: 08-07-1981  MRN: [id number]  CC:  F/u lab results  Subjective:  Patrick Ibarra is a 36 y.o. male who presents for UC visit. Wife is with him His concerns today include:  Pt with HTN, ESRD refusing HD, ACD, CHF with EF 60-65%. Pt seen by me 3 days ago and had blood tests done. He remains severely anemic with elevated BUN and creatinine..  Covering physician who receive the results advised him to be seen in the ER. He went to the ER but subsequently left AMA. He still does not want dialysis. States that part of his decision is a religious that he is leaving for deliverance and healing. -Still has fatigue. He is denying any dizziness. Positive shortness of breath with exertion.  He presents today wanting to discuss release to return to work. 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  Patient Active Problem List   Diagnosis Date Noted  . Hypertensive emergency 03/01/2017  . CHF (congestive heart failure) ([place]) 03/01/2017  . Obesity 03/01/2017  . Anemia of chronic kidney failure 03/01/2017  . Essential hypertension 01/05/2014  . Wheezing 12/21/2013  . Renal failure (ARF), acute on chronic (HCC) 12/20/2013  . Diabetes mellitus ([place]) 12/24/2011  . Chronic renal insufficiency, stage IV (severe) ([place]) 12/24/2011     Current Outpatient Prescriptions on File Prior to Visit  Medication Sig Dispense Refill  . amLODipine (NORVASC) 5 MG tablet Take 0.5 tablets (2.5 mg total) by mouth daily. 30 tablet 3  . Blood Glucose Monitoring Suppl (TRUE METRIX METER) w/Device KIT Use as directed 1 kit 0  . calcitRIOL (ROCALTROL) 0.25 MCG capsule Take 1 capsule (0.25 mcg total) by mouth daily. 30 capsule 1  . calcium carbonate (TUMS) 500 MG chewable tablet 2 tabs PO with each meal and at bedtime. 240 tablet 1  . [START ON 04/04/2017]  furosemide (LASIX) 80 MG tablet Take 1 tablet (80 mg total) by mouth 2 (two) times daily. 60 tablet 1  . glucose blood (TRUE METRIX BLOOD GLUCOSE TEST) test strip Use as instructed 100 each 12  . [START ON 04/04/2017] labetalol (NORMODYNE) 300 MG tablet Take 1 tablet (300 mg total) by mouth 2 (two) times daily. 60 tablet 1  . sodium bicarbonate 650 MG tablet Take 1 tablet (650 mg total) by mouth 3 (three) times daily. 90 tablet 0  . triamcinolone cream (KENALOG) 0.1 % Apply 1 application topically 2 (two) times daily. 80 g 1  . TRUEPLUS LANCETS 28G MISC Use as directed 100 each 1   No current facility-administered medications on file prior to visit.     No Known Allergies   ROS: Review of Systems As stated above  PHYSICAL EXAM: BP 118/74   Pulse 73   Temp 97.8 F (36.6 C) (Oral)   Resp 16   Wt 245 lb (111.1 kg)   SpO2 97%   BMI 32.32 kg/m   Physical Exam  General appearance - young African-American male who appears chronically ill but in NAD  Mental status - he is alert and oriented 3  Extremities - trace to 1+ lower extremity edema Lab Results  Component Value Date   WBC 5.1 03/19/2017   HGB 6.8 (LL) 03/19/2017   HCT 20.4 (L) 03/19/2017   MCV 79.7 03/19/2017   PLT 119 (  L) 03/19/2017     Chemistry      Component Value Date/Time   NA 132 (L) 03/19/2017 1355   NA 134 03/18/2017 0942   K 3.9 03/19/2017 1355   CL 95 (L) 03/19/2017 1355   CO2 12 (L) 03/19/2017 1355   BUN 163 (H) 03/19/2017 1355   BUN 135 (HH) 03/18/2017 0942   CREATININE 37.03 (H) 03/19/2017 1355   CREATININE 3.16 (H) 01/05/2014 1459      Component Value Date/Time   CALCIUM 5.7 (LL) 03/19/2017 1355   CALCIUM 7.4 (L) 03/01/2017 0752   ALKPHOS 84 03/19/2017 1355   AST 9 (L) 03/19/2017 1355   ALT 8 (L) 03/19/2017 1355   BILITOT 1.0 03/19/2017 1355   BILITOT 0.3 03/18/2017 0942       ASSESSMENT AND PLAN: 1. ESRD (end stage renal disease) ([place]) -2. Anemia of chronic disease -I advised against  patient returning to work given his current medical condition and prognosis should he continue to refuse dialysis. I think he should apply for disability. Wife informs me that application was already submitted. I will write a letter in support of his disability -I encouraged him to reconsider moving forward with having dialysis done -Discussed having a blood transfusion to bring up his blood count but patient is on the fence with this. Have referred him to nephrology to be considered for Procrit/Aranesp  -In the meantime I have recommended taking an iron supplement, Rocaltrol, Tums, and sodium bicarbonate.   Patient was given the opportunity to ask questions.  Patient verbalized understanding of the plan and was able to repeat key elements of the plan.   No orders of the defined types were placed in this encounter.    Requested Prescriptions   Signed Prescriptions Disp Refills  . ferrous sulfate (FERROUSUL) 325 (65 FE) MG tablet 90 tablet 3    Sig: Take 1 tablet (325 mg total) by mouth daily with breakfast.    Future Appointments Date Time Provider [place]  05/30/2017 8:30 AM Ladell Pier, MD [facility] None    Karle Plumber, MD, FACP

## 2017-03-26 ENCOUNTER — Encounter: Payer: Self-pay | Admitting: Internal Medicine

## 2017-03-26 ENCOUNTER — Other Ambulatory Visit: Payer: Self-pay | Admitting: Pharmacist

## 2017-03-26 MED ORDER — SODIUM BICARBONATE 650 MG PO TABS
650.0000 mg | ORAL_TABLET | Freq: Three times a day (TID) | ORAL | 0 refills | Status: DC
Start: 2017-03-26 — End: 2017-05-30

## 2017-03-26 MED FILL — SODIUM BICARB 10 GRAIN TAB: 650 | 30 days supply | Qty: 90 | Fill #0

## 2017-03-26 NOTE — Progress Notes (Signed)
Letter written for pt in support of disability.

## 2017-04-03 ENCOUNTER — Encounter (HOSPITAL_COMMUNITY): Payer: Self-pay

## 2017-04-03 ENCOUNTER — Inpatient Hospital Stay (HOSPITAL_COMMUNITY)
Admission: EM | Admit: 2017-04-03 | Discharge: 2017-04-11 | DRG: 356 | Disposition: A | Discharge status: Home / Self Care | Payer: Medicaid Other | Attending: Internal Medicine | Admitting: Internal Medicine

## 2017-04-03 ENCOUNTER — Inpatient Hospital Stay (HOSPITAL_COMMUNITY): Payer: Medicaid Other

## 2017-04-03 ENCOUNTER — Emergency Department (HOSPITAL_COMMUNITY): Payer: Medicaid Other

## 2017-04-03 DIAGNOSIS — I5032 Chronic diastolic (congestive) heart failure: Secondary | ICD-10-CM | POA: Diagnosis present

## 2017-04-03 DIAGNOSIS — I959 Hypotension, unspecified: Secondary | ICD-10-CM | POA: Diagnosis present

## 2017-04-03 DIAGNOSIS — F4321 Adjustment disorder with depressed mood: Secondary | ICD-10-CM | POA: Diagnosis present

## 2017-04-03 DIAGNOSIS — Z419 Encounter for procedure for purposes other than remedying health state, unspecified: Secondary | ICD-10-CM

## 2017-04-03 DIAGNOSIS — Z79899 Other long term (current) drug therapy: Secondary | ICD-10-CM | POA: Diagnosis not present

## 2017-04-03 DIAGNOSIS — M722 Plantar fascial fibromatosis: Secondary | ICD-10-CM | POA: Diagnosis present

## 2017-04-03 DIAGNOSIS — Z95828 Presence of other vascular implants and grafts: Secondary | ICD-10-CM

## 2017-04-03 DIAGNOSIS — E872 Acidosis: Secondary | ICD-10-CM | POA: Diagnosis present

## 2017-04-03 DIAGNOSIS — K859 Acute pancreatitis without necrosis or infection, unspecified: Secondary | ICD-10-CM | POA: Diagnosis present

## 2017-04-03 DIAGNOSIS — D62 Acute posthemorrhagic anemia: Secondary | ICD-10-CM | POA: Diagnosis present

## 2017-04-03 DIAGNOSIS — E1122 Type 2 diabetes mellitus with diabetic chronic kidney disease: Secondary | ICD-10-CM | POA: Diagnosis present

## 2017-04-03 DIAGNOSIS — D638 Anemia in other chronic diseases classified elsewhere: Secondary | ICD-10-CM | POA: Diagnosis present

## 2017-04-03 DIAGNOSIS — K920 Hematemesis: Secondary | ICD-10-CM

## 2017-04-03 DIAGNOSIS — N186 End stage renal disease: Secondary | ICD-10-CM | POA: Diagnosis present

## 2017-04-03 DIAGNOSIS — I132 Hypertensive heart and chronic kidney disease with heart failure and with stage 5 chronic kidney disease, or end stage renal disease: Secondary | ICD-10-CM | POA: Diagnosis present

## 2017-04-03 DIAGNOSIS — D631 Anemia in chronic kidney disease: Secondary | ICD-10-CM | POA: Diagnosis present

## 2017-04-03 DIAGNOSIS — K922 Gastrointestinal hemorrhage, unspecified: Secondary | ICD-10-CM | POA: Diagnosis present

## 2017-04-03 DIAGNOSIS — Z833 Family history of diabetes mellitus: Secondary | ICD-10-CM

## 2017-04-03 DIAGNOSIS — N19 Unspecified kidney failure: Secondary | ICD-10-CM | POA: Diagnosis present

## 2017-04-03 DIAGNOSIS — Z992 Dependence on renal dialysis: Secondary | ICD-10-CM

## 2017-04-03 DIAGNOSIS — D649 Anemia, unspecified: Secondary | ICD-10-CM | POA: Diagnosis not present

## 2017-04-03 DIAGNOSIS — Z8249 Family history of ischemic heart disease and other diseases of the circulatory system: Secondary | ICD-10-CM | POA: Diagnosis not present

## 2017-04-03 DIAGNOSIS — Z6829 Body mass index (BMI) 29.0-29.9, adult: Secondary | ICD-10-CM | POA: Diagnosis not present

## 2017-04-03 DIAGNOSIS — E790 Hyperuricemia without signs of inflammatory arthritis and tophaceous disease: Secondary | ICD-10-CM | POA: Diagnosis present

## 2017-04-03 DIAGNOSIS — E669 Obesity, unspecified: Secondary | ICD-10-CM | POA: Diagnosis present

## 2017-04-03 DIAGNOSIS — I10 Essential (primary) hypertension: Secondary | ICD-10-CM

## 2017-04-03 DIAGNOSIS — K226 Gastro-esophageal laceration-hemorrhage syndrome: Secondary | ICD-10-CM | POA: Diagnosis present

## 2017-04-03 DIAGNOSIS — D691 Qualitative platelet defects: Secondary | ICD-10-CM | POA: Diagnosis present

## 2017-04-03 LAB — COMPREHENSIVE METABOLIC PANEL
ALBUMIN: 3 g/dL — AB (ref 3.5–5.0)
ALK PHOS: 68 U/L (ref 38–126)
ALT: 7 U/L — ABNORMAL LOW (ref 17–63)
ANION GAP: 29 — AB (ref 5–15)
AST: 8 U/L — ABNORMAL LOW (ref 15–41)
BILIRUBIN TOTAL: 0.7 mg/dL (ref 0.3–1.2)
BUN: 207 mg/dL — ABNORMAL HIGH (ref 6–20)
CALCIUM: 5.5 mg/dL — AB (ref 8.9–10.3)
CO2: 12 mmol/L — ABNORMAL LOW (ref 22–32)
Chloride: 99 mmol/L — ABNORMAL LOW (ref 101–111)
Creatinine, Ser: 43.28 mg/dL — ABNORMAL HIGH (ref 0.61–1.24)
GFR calc non Af Amer: 1 mL/min — ABNORMAL LOW (ref >60)
GFR, EST AFRICAN AMERICAN: 1 mL/min — AB (ref >60)
GLUCOSE: 89 mg/dL (ref 65–99)
POTASSIUM: 4.6 mmol/L (ref 3.5–5.1)
Sodium: 140 mmol/L (ref 135–145)
TOTAL PROTEIN: 6.5 g/dL (ref 6.5–8.1)

## 2017-04-03 LAB — CBC
HEMATOCRIT: 14.8 % — AB (ref 39.0–52.0)
HEMOGLOBIN: 5.1 g/dL — AB (ref 13.0–17.0)
MCH: 26.6 pg (ref 26.0–34.0)
MCHC: 34.5 g/dL (ref 30.0–36.0)
MCV: 77.1 fL — ABNORMAL LOW (ref 78.0–100.0)
Platelets: 122 10*3/uL — ABNORMAL LOW (ref 150–400)
RBC: 1.92 MIL/uL — AB (ref 4.22–5.81)
RDW: 16.7 % — ABNORMAL HIGH (ref 11.5–15.5)
WBC: 7.5 10*3/uL (ref 4.0–10.5)

## 2017-04-03 LAB — HEMOGLOBIN: Hemoglobin: 6.7 g/dL — CL (ref 13.0–17.0)

## 2017-04-03 LAB — PROTIME-INR
INR: 1.59
PROTHROMBIN TIME: 19.2 s — AB (ref 11.4–15.2)

## 2017-04-03 LAB — PREPARE RBC (CROSSMATCH)

## 2017-04-03 LAB — ABO/RH
ABO/RH(D): O POS
ABO/RH(D): O POS

## 2017-04-03 LAB — LIPASE, BLOOD: Lipase: 133 U/L — ABNORMAL HIGH (ref 11–51)

## 2017-04-03 LAB — HEMATOCRIT: HEMATOCRIT: 19.8 % — AB (ref 39.0–52.0)

## 2017-04-03 LAB — POC OCCULT BLOOD, ED: FECAL OCCULT BLD: POSITIVE — AB

## 2017-04-03 MED ORDER — DARBEPOETIN ALFA 200 MCG/0.4ML IJ SOSY
200.0000 ug | PREFILLED_SYRINGE | INTRAMUSCULAR | Status: DC
  Administered 2017-04-04: 200 ug via INTRAVENOUS

## 2017-04-03 MED ORDER — TRIAMCINOLONE ACETONIDE 0.1 % EX CREA
1.0000 "application " | TOPICAL_CREAM | Freq: Two times a day (BID) | CUTANEOUS | Status: DC
  Administered 2017-04-04 – 2017-04-10 (×8): 1 via TOPICAL
  Filled 2017-04-03 (×4): qty 15

## 2017-04-03 MED ORDER — PENTAFLUOROPROP-TETRAFLUOROETH EX AERO: 1.0000 "application " | INHALATION_SPRAY | CUTANEOUS | Status: DC | PRN

## 2017-04-03 MED ORDER — LIDOCAINE-PRILOCAINE 2.5-2.5 % EX CREA: 1.0000 "application " | TOPICAL_CREAM | CUTANEOUS | Status: DC | PRN

## 2017-04-03 MED ORDER — SODIUM CHLORIDE 0.9 % IV SOLN: 100.0000 mL | INTRAVENOUS | Status: DC | PRN

## 2017-04-03 MED ORDER — SODIUM CHLORIDE 0.9 % IV SOLN
10.0000 mL/h | Freq: Once | INTRAVENOUS | Status: AC
  Administered 2017-04-03: 10 mL/h via INTRAVENOUS

## 2017-04-03 MED ORDER — SODIUM BICARBONATE 650 MG PO TABS
650.0000 mg | ORAL_TABLET | Freq: Three times a day (TID) | ORAL | Status: DC
  Administered 2017-04-04 – 2017-04-07 (×8): 650 mg via ORAL
  Filled 2017-04-03 (×8): qty 1

## 2017-04-03 MED ORDER — ALTEPLASE 2 MG IJ SOLR: 2.0000 mg | Freq: Once | INTRAMUSCULAR | Status: DC | PRN

## 2017-04-03 MED ORDER — CALCITRIOL 0.25 MCG PO CAPS
0.2500 ug | ORAL_CAPSULE | Freq: Every day | ORAL | Status: DC
  Administered 2017-04-04 – 2017-04-06 (×3): 0.25 ug via ORAL
  Filled 2017-04-03 (×3): qty 1

## 2017-04-03 MED ORDER — IOPAMIDOL (ISOVUE-300) INJECTION 61%
INTRAVENOUS | Status: AC
  Filled 2017-04-03: qty 30

## 2017-04-03 MED ORDER — SODIUM CHLORIDE 0.9 % IV SOLN: Freq: Once | INTRAVENOUS | Status: DC

## 2017-04-03 MED ORDER — SODIUM CHLORIDE 0.9 % IV BOLUS (SEPSIS)
1000.0000 mL | Freq: Once | INTRAVENOUS | Status: AC
  Administered 2017-04-03: 1000 mL via INTRAVENOUS

## 2017-04-03 MED ORDER — LIDOCAINE HCL (PF) 1 % IJ SOLN: 5.0000 mL | INTRAMUSCULAR | Status: DC | PRN

## 2017-04-03 MED ORDER — PANTOPRAZOLE SODIUM 40 MG IV SOLR
40.0000 mg | Freq: Once | INTRAVENOUS | Status: AC
  Administered 2017-04-03: 40 mg via INTRAVENOUS
  Filled 2017-04-03: qty 40

## 2017-04-03 MED ORDER — SODIUM CHLORIDE 0.9 % IV SOLN
8.0000 mg/h | INTRAVENOUS | Status: DC
  Administered 2017-04-03 – 2017-04-04 (×2): 8 mg/h via INTRAVENOUS
  Filled 2017-04-03 (×4): qty 80

## 2017-04-03 MED ORDER — IOPAMIDOL (ISOVUE-300) INJECTION 61%: 30.0000 mL | Freq: Once | INTRAVENOUS | Status: DC | PRN

## 2017-04-03 MED ORDER — SODIUM CHLORIDE 0.9 % IV SOLN
100.0000 mL | INTRAVENOUS | Status: DC | PRN
  Administered 2017-04-10: 100 mL via INTRAVENOUS

## 2017-04-03 MED ORDER — ONDANSETRON HCL 4 MG/2ML IJ SOLN
4.0000 mg | Freq: Once | INTRAMUSCULAR | Status: AC
  Administered 2017-04-03: 4 mg via INTRAVENOUS
  Filled 2017-04-03: qty 2

## 2017-04-03 NOTE — ED Notes (Signed)
Bed: WA04 Expected date:  Expected time:  Means of arrival:  Comments: EMS hemoptysis

## 2017-04-03 NOTE — H&P (Signed)
Date: 04/04/2017               Patient Name:  Patrick Ibarra MRN: [id number]  DOB: [birth year] Age / Sex: 36 y.o., male   PCP: Ladell Pier, MD         Medical Service: Internal Medicine Teaching Service         Attending Physician: Dr. Oval Linsey, MD    First Contact: Dr.  Pager: [phone]  Second Contact: Dr. Maryellen Pile Pager: [phone]       After Hours (After 5p/  First Contact Pager: [phone]  weekends / holidays): Second Contact Pager: [phone]   Chief Complaint: hematemesis, dizziness, hematochezia  History of Present Illness: 36 yo male with pmh of T2DM,HTN,  Hypertensive nephrosclerosis CKD (stage 5), presents to the ED with hematemesis and hematochezia that began late last night/early this morning, pt describes the vomit as maroon and said he felt a lot better afterwards but he did feel dizzy.  He also said he felt like he had to use the bathroom and had diarrhea and noticed some dark red blood mixed in with his stool.  He denies LOC, chest pain, SOB, abdominal pain, palpitations.  Pt does admit to changes in taste, a tremor in his hands, decreased appetite, swelling in legs and ankles.  He attributes all his symptoms to the medications he was discharged with on his last admission.  On March 01, 2017 pt was admitted for Hypertensive emergency and his blood pressure stabilized however, pts creatinine was 31, nephrology consulted and advised that pt in advanced stage 5 kidney disease that can only be managed with dialysis.  Pt refused dialysis throughout admission and palliative care was consulted, goals of care were discussed and pt stated he refuses dialysis but would like aggressive resuscitation and dialysis if he arrests.   Meds:  Current Meds  Medication Sig  . amLODipine (NORVASC) 5 MG tablet Take 0.5 tablets (2.5 mg total) by mouth daily.  . calcitRIOL (ROCALTROL) 0.25 MCG capsule Take 1 capsule (0.25 mcg total) by mouth daily.  . ferrous sulfate (FERROUSUL) 325  (65 FE) MG tablet Take 1 tablet (325 mg total) by mouth daily with breakfast.  . furosemide (LASIX) 80 MG tablet Take 1 tablet (80 mg total) by mouth 2 (two) times daily.  Marland Kitchen labetalol (NORMODYNE) 300 MG tablet Take 1 tablet (300 mg total) by mouth 2 (two) times daily.  . sodium bicarbonate 650 MG tablet Take 1 tablet (650 mg total) by mouth 3 (three) times daily.  Marland Kitchen triamcinolone cream (KENALOG) 0.1 % Apply 1 application topically 2 (two) times daily.  . [DISCONTINUED] ibuprofen (ADVIL,MOTRIN) 200 MG tablet Take 600-800 mg by mouth every 6 (six) hours as needed (foot pain).     Allergies: Allergies as of 04/03/2017  . (No Known Allergies)   Past Medical History:  Diagnosis Date  . Hypertension   . LV dysfunction   . Obesity   . Uncontrolled diabetes mellitus ([place])     Family History:  Family History  Problem Relation Age of Onset  . Hypertension Mother   . Diabetes Mother   . Hypertension Father     Social History: Social History   Social History  . Marital status: Married    Spouse name: N/A  . Number of children: N/A  . Years of education: N/A   Occupational History  . Not on file.   Social History Main Topics  . Smoking status: Never Smoker  .  Smokeless tobacco: Never Used  . Alcohol use Yes     Comment: WINE ONCE A WEEK  . Drug use: No  . Sexual activity: Not on file   Other Topics Concern  . Not on file   Social History Narrative   He is married with step kids. He works at [employer], [employer].     Review of Systems: Review of Systems  Constitutional: Positive for weight loss. Negative for chills and fever.  HENT: Negative for hearing loss and tinnitus.   Eyes: Negative for blurred vision and double vision.  Respiratory: Negative for cough, hemoptysis and wheezing.   Cardiovascular: Positive for leg swelling. Negative for chest pain and palpitations.  Gastrointestinal: Positive for blood in stool, diarrhea and vomiting. Negative for abdominal pain,  constipation, heartburn and nausea.  Musculoskeletal: Negative for myalgias and neck pain.  Skin: Positive for itching.  Neurological: Positive for dizziness. Negative for speech change, focal weakness, seizures, loss of consciousness and headaches.  Endo/Heme/Allergies: Bruises/bleeds easily.  Psychiatric/Behavioral: Negative for hallucinations, substance abuse and suicidal ideas.    Physical Exam: Blood pressure 129/68, pulse 84, temperature 97.7 F (36.5 C), temperature source Oral, resp. rate 12, height 6\' 4"  (1.93 m), weight 241 lb 13.5 oz (109.7 kg), SpO2 98 %. Physical Exam  Constitutional: He is oriented to person, place, and time. He appears well-developed and well-nourished.  HENT:  Head: Normocephalic and atraumatic.  Eyes: Pupils are equal, round, and reactive to light. EOM are normal.  Neck: Normal range of motion. Neck supple. JVD (jvd 1cm below angle of mandible) present.  Cardiovascular: Normal rate and regular rhythm.  Exam reveals gallop and S3. Exam reveals no friction rub.   No murmur heard. Pulmonary/Chest: Effort normal and breath sounds normal. No stridor. No respiratory distress. He has no wheezes. He has no rales.  Abdominal: Soft. Bowel sounds are increased. There is no tenderness.  Genitourinary: Rectal exam shows guaiac positive stool.  Musculoskeletal:       Right ankle: He exhibits swelling.       Left ankle: He exhibits swelling.       Right lower leg: He exhibits edema.       Left lower leg: He exhibits edema.  Neurological: He is alert and oriented to person, place, and time.  Prominent asterixis, mentation slowed  Skin: Skin is warm and dry. Lesion (lesions from picking his skin present throughout upper extremities and trunk) noted. He is not diaphoretic. No pallor.    EKG: personally reviewed my interpretation is sinus rhythm, long qt interval  CXR: personally reviewed my interpretation is cardiomegaly, central venous line placed  Assessment &  Plan by Problem: Principal Problem:   GI bleed Active Problems:   ESRD (end stage renal disease) (HCC)   Essential hypertension   Uremia   Symptomatic anemia   Hypocalcemia   36 yo male with pmh of ESRD, HTN, T2DM, presents with new onset hematemesis and hematochezia beginning yesterday, pt needed dialysis one month ago but refused, pt amenable to dialysis on this admission.   GI bleed: likely upper GI bleed, pt with new onset hematemesis and hematochezia beginning yesterday evening. Likely precipitated by poor platelet function 2/2 uremia  -Pt agreed to undergo dialysis -Protonix drip -Type and Screen -Transfuse pt prior to dialysis -Consider adding desmopressin -GI consulted  ESRD: Stage 5 pt needed dialysis one month ago and has clinically deteriorated since then, BUN 207 Cr 43.  -Pt agreeable to Dialysis -Nephrology consulted -Catheter placed -Pts dialysis orders  placed, awaiting dialysis  HTN: 2/2 ESRD, pts recent bps have been on the lower side Blood pressure 122/86 most recent  -Hold bp meds -await dialysis   Uremia: pt slow to mentate has asterixis on exam and change in taste, platelet dysfunction  -Pt awaiting dialysis -Consider desmopressin if bleeding continues  Symptomatic anemia: pts hgb found to be 5.1 in ED, chronically anemic due to CKD, pt having dizziness  -received 2 units [facility] -type and screen -PRBCs and aranesp -IV iron   Hypocalcemia: Ca 5.4 on BMP,  2/2 CKD, PTH level 1495 one month ago and phos 9.5  -phoslo with meals -calcitriol -renal diet low in phos  Dispo: Admit patient to Inpatient with expected length of stay greater than 2 midnights.  Signed: Katherine Roan, MD 04/04/2017, 7:49 AM  Vickki Muff MD PGY-1 Internal Medicine Pager # [phone]

## 2017-04-03 NOTE — Consult Note (Addendum)
Reason for Consult: To manage dialysis and dialysis related needs Referring Physician: Dr. Benjamine Mola, Internal Medicine Teaching Service  Patrick Ibarra is an 36 y.o. male.  HPI: Pt is a 37M with ESRD mot yet on Dialysis, HTN, and DM II who is now seen in consultation at the request of Dr. Benjamine Mola for evaluation and recommendations surrounding ESRD.  Pt was recently admitted to [facility] from 6/30-7/4 with uremic symptoms and HTNsive urgency.  He was reluctant to initiate dialysis at that time and believed that he could cure his renal issues from drinking alkaline water.  ESRD attributed to hypertensive nephrosclerosis, and renal US showed markedly echogenic and atrophic kidneys.  He was strongly encouraged to get an access and begin dialysis but he declined.    He now comes back in with hematemesis, melena, and uremic symptoms.  Hgb is 5.1, BUN 207, Cr 43.  He is having slowed mentation, n/v, weight loss, loss of appetite, and asterixis.  His wife is at bedside.  He is willing to initiate dialysis at this time.  CT abd/ pelvis shows mild peripancreatic inflammation consistent with acute pancreatitis.   Past Medical History:  Diagnosis Date  . Hypertension   . LV dysfunction   . Obesity   . Uncontrolled diabetes mellitus ([place])     Past Surgical History:  Procedure Laterality Date  . TONSILLECTOMY AND ADENOIDECTOMY      Family History  Problem Relation Age of Onset  . Hypertension Mother   . Diabetes Mother   . Hypertension Father     Social History:  reports that he has never smoked. He has never used smokeless tobacco. He reports that he drinks alcohol. He reports that he does not use drugs.  Allergies: No Known Allergies  Medications:  Scheduled: . [START ON 04/04/2017] calcitRIOL  0.25 mcg Oral Daily  . iopamidol      . [START ON 04/04/2017] sodium bicarbonate  650 mg Oral TID  . triamcinolone cream  1 application Topical BID     Results for orders placed or performed during the hospital  encounter of 04/03/17 (from the past 48 hour(s))  Comprehensive metabolic panel     Status: Abnormal   Collection Time: 04/03/17 12:43 PM  Result Value Ref Range   Sodium 140 135 - 145 mmol/L    Comment: REPEATED TO VERIFY   Potassium 4.6 3.5 - 5.1 mmol/L   Chloride 99 (L) 101 - 111 mmol/L    Comment: REPEATED TO VERIFY   CO2 12 (L) 22 - 32 mmol/L    Comment: REPEATED TO VERIFY   Glucose, Bld 89 65 - 99 mg/dL   BUN 207 (H) 6 - 20 mg/dL    Comment: RESULTS CONFIRMED BY MANUAL DILUTION   Creatinine, Ser 43.28 (H) 0.61 - 1.24 mg/dL    Comment: RESULTS CONFIRMED BY MANUAL DILUTION   Calcium 5.5 (LL) 8.9 - 10.3 mg/dL    Comment: CRITICAL RESULT CALLED TO, READ BACK BY AND VERIFIED WITH: C.HALL RN [id number] A.QUIZON    Total Protein 6.5 6.5 - 8.1 g/dL   Albumin 3.0 (L) 3.5 - 5.0 g/dL   AST 8 (L) 15 - 41 U/L   ALT 7 (L) 17 - 63 U/L   Alkaline Phosphatase 68 38 - 126 U/L   Total Bilirubin 0.7 0.3 - 1.2 mg/dL   GFR calc non Af Amer 1 (L) >60 mL/min   GFR calc Af Amer 1 (L) >60 mL/min    Comment: (NOTE) The eGFR has  been calculated using the CKD EPI equation. This calculation has not been validated in all clinical situations. eGFR's persistently <60 mL/min signify possible Chronic Kidney Disease.    Anion gap 29 (H) 5 - 15    Comment: REPEATED TO VERIFY  CBC     Status: Abnormal   Collection Time: 04/03/17 12:43 PM  Result Value Ref Range   WBC 7.5 4.0 - 10.5 K/uL   RBC 1.92 (L) 4.22 - 5.81 MIL/uL   Hemoglobin 5.1 (LL) 13.0 - 17.0 g/dL    Comment: REPEATED TO VERIFY CRITICAL RESULT CALLED TO, READ BACK BY AND VERIFIED WITH: C.HALL RN 7106 [phone] A.QUIZON    HCT 14.8 (L) 39.0 - 52.0 %   MCV 77.1 (L) 78.0 - 100.0 fL   MCH 26.6 26.0 - 34.0 pg   MCHC 34.5 30.0 - 36.0 g/dL   RDW 16.7 (H) 11.5 - 15.5 %   Platelets 122 (L) 150 - 400 K/uL  Type and screen [place]     Status: None (Preliminary result)   Collection Time: 04/03/17 12:43 PM  Result Value Ref  Range   ABO/RH(D) O POS    Antibody Screen NEG    Sample Expiration 04/06/2017    Unit Number [id number]    Blood Component Type RBC LR PHER1    Unit division 00    Status of Unit ISSUED    Transfusion Status OK TO TRANSFUSE    Crossmatch Result Compatible    Unit Number [id number]    Blood Component Type RED CELLS,LR    Unit division 00    Status of Unit ISSUED    Transfusion Status OK TO TRANSFUSE    Crossmatch Result Compatible   Protime-INR     Status: Abnormal   Collection Time: 04/03/17 12:43 PM  Result Value Ref Range   Prothrombin Time 19.2 (H) 11.4 - 15.2 seconds   INR 1.59   Lipase, blood     Status: Abnormal   Collection Time: 04/03/17 12:43 PM  Result Value Ref Range   Lipase 133 (H) 11 - 51 U/L  ABO/Rh     Status: None   Collection Time: 04/03/17 12:43 PM  Result Value Ref Range   ABO/RH(D) O POS   POC occult blood, ED     Status: Abnormal   Collection Time: 04/03/17 12:57 PM  Result Value Ref Range   Fecal Occult Bld POSITIVE (A) NEGATIVE  Prepare RBC     Status: None   Collection Time: 04/03/17  2:00 PM  Result Value Ref Range   Order Confirmation ORDER PROCESSED BY BLOOD BANK   Type and screen [place]     Status: None   Collection Time: 04/03/17  9:58 PM  Result Value Ref Range   ABO/RH(D) O POS    Antibody Screen NEG    Sample Expiration 04/06/2017   ABO/Rh     Status: None   Collection Time: 04/03/17  9:58 PM  Result Value Ref Range   ABO/RH(D) O POS     Ct Abdomen Pelvis Wo Contrast  Result Date: 04/03/2017 CLINICAL DATA:  Abdominal pain, pancreatitis, elevated lipase. EXAM: CT ABDOMEN AND PELVIS WITHOUT CONTRAST TECHNIQUE: Multidetector CT imaging of the abdomen and pelvis was performed following the standard protocol without IV contrast. COMPARISON:  None. FINDINGS: Lower chest: Lung bases are clear. Hepatobiliary: Unenhanced liver is unremarkable. Mild layering gallstones (series 2/ image 37). No associated  inflammatory changes. No intrahepatic or extrahepatic ductal dilatation. Pancreas: Fullness  of the pancreatic tail (series 2/ image 21) with associated mild peripancreatic fluid, likely reflecting acute pancreatitis. No pancreatic ductal dilatation. No drainable fluid collection/pseudocyst. Spleen: Within normal limits, noting mild perisplenic ascites. Adrenals/Urinary Tract: Adrenal glands are within normal limits. Bilateral renal atrophy.  No renal calculi or hydronephrosis. Bladder is mildly thick-walled. Stomach/Bowel: Stomach is within normal limits. No evidence of bowel obstruction. Appendix is not discretely visualized. Ascending colon/ cecum is mildly thick-walled. Vascular/Lymphatic: No evidence of abdominal aortic aneurysm. No suspicious abdominopelvic lymphadenopathy. Reproductive: Prostate is grossly unremarkable. Other: Small volume pelvic ascites. Musculoskeletal: Visualized osseous structures are within normal limits. IMPRESSION: Mild peripancreatic inflammatory changes, suggesting acute pancreatitis. No drainable fluid collection/pseudocyst. Mild cholelithiasis. No intrahepatic or extrahepatic ductal dilatation. Mild abdominopelvic ascites. Electronically Signed   By: Julian Hy M.D.   On: 04/03/2017 15:22    ROS: all other systems reviewed and are negative except as per HPI Blood pressure 138/84, pulse 84, temperature 98.7 F (37.1 C), temperature source Oral, resp. rate 11, weight 111.1 kg (244 lb 14.9 oz), SpO2 100 %. . GEN young man, ill-appearing HEENT EOMI, PERRL, dry MM NECK + JVD to angle of mandible PULM bibasilar inspiratory crackles CV tachycardic, II/VI systolic murmur at RUSB.  No rub ABD soft, nontender, nondistended.  Hypoactive bowel sounds.  No hepatosplenomegaly EXT 1+ LE edema NEURO slowed mentation and prominent asterixis SKIN + uremic frost  Assessment/Plan:  1. ESRD: clearly uremic.  Willing to start HD.  CCM c/s for HD cath, appreciate assistance.   Will need vein mapping and permanent access placement as well as tunneled access placement in the near future.   2 Acute GI bleed, likely upper: on PPI gtt, GI consulted.  I suspect uremic platelets not helping matters.  S/p 2 u pRBCs at [facility].  Will transfuse prn Hgb < 7.  Scoping plans per GI 3 Hypertension: Normally uncontrolled hypertensive, relatively hypotensive for him in setting of GI bleed, will hold antihypertensives for now, expect pressures to become overall more controlled with UF 4. Anemia of ESRD/ABLA: Will start aranesp in addition to pRBCs, IV iron likely needed too 5. Metabolic Bone Disease: PTH 1495, phos 7/2 was 10.2.  Start phoslo when eating.  Resume calcitriol 6.  Nutrition: will need RD c/s on low phos diet 7.  Acute pancreatitis: mildly elevated at 133.  Supportive care   Madelon Lips 04/03/2017, 11:04 PM

## 2017-04-03 NOTE — Consult Note (Signed)
Reason for Consult: Hematemesis, Melena, Anemia, ESRD Referring Physician: [facility]  Patrick Ibarra [place] HPI: This is a 36 year old male with Stage V ESRD secondary to uncontrolled HTN, DM, and diastolic CHF who is admitted to the hospital with hematemesis and melena yesterday.  Last week he used some Aleve for his plantar fascitis, and he thinks he used 4-5 tablets.  He reported some issues with melena since last month, but it has been intermittent.  His wife found evidence of hematemesis and melena in the toilet today and she concluded that this occurred yesterday.  When she found the bleeding she brought him directly to the ER.  He was recently discharged from the hospital for his ESRD.  In the past he was followed by renal, but then he did not follow up and he was not compliant with his antihypertensives.  Multiple members of the medical staff tried to convince him to change his mind about accepting hemodialysis, but he continues to refuse.  A Palliative Care consultation was obtained and subsequently he was discharged.  His HGB on admission today is at 5.1 g/dL and last month it was in the 7 range.  His BP, which previously was very hypertensive is not normotensive.  He denies any issues with chest pain or SOB.  The current BUN is at 207 and creatinine is at 43.  Past Medical History:  Diagnosis Date  . Hypertension   . LV dysfunction   . Obesity   . Uncontrolled diabetes mellitus ([place])     Past Surgical History:  Procedure Laterality Date  . TONSILLECTOMY AND ADENOIDECTOMY      Family History  Problem Relation Age of Onset  . Hypertension Mother   . Diabetes Mother   . Hypertension Father     Social History:  reports that he has never smoked. He has never used smokeless tobacco. He reports that he drinks alcohol. He reports that he does not use drugs.  Allergies: No Known Allergies  Medications:  Scheduled: . iopamidol       Continuous: . pantoprozole (PROTONIX) infusion       Results for orders placed or performed during the hospital encounter of 04/03/17 (from the past 24 hour(s))  Comprehensive metabolic panel     Status: Abnormal   Collection Time: 04/03/17 12:43 PM  Result Value Ref Range   Sodium 140 135 - 145 mmol/L   Potassium 4.6 3.5 - 5.1 mmol/L   Chloride 99 (L) 101 - 111 mmol/L   CO2 12 (L) 22 - 32 mmol/L   Glucose, Bld 89 65 - 99 mg/dL   BUN 207 (H) 6 - 20 mg/dL   Creatinine, Ser 43.28 (H) 0.61 - 1.24 mg/dL   Calcium 5.5 (LL) 8.9 - 10.3 mg/dL   Total Protein 6.5 6.5 - 8.1 g/dL   Albumin 3.0 (L) 3.5 - 5.0 g/dL   AST 8 (L) 15 - 41 U/L   ALT 7 (L) 17 - 63 U/L   Alkaline Phosphatase 68 38 - 126 U/L   Total Bilirubin 0.7 0.3 - 1.2 mg/dL   GFR calc non Af Amer 1 (L) >60 mL/min   GFR calc Af Amer 1 (L) >60 mL/min   Anion gap 29 (H) 5 - 15  CBC     Status: Abnormal   Collection Time: 04/03/17 12:43 PM  Result Value Ref Range   WBC 7.5 4.0 - 10.5 K/uL   RBC 1.92 (L) 4.22 - 5.81 MIL/uL   Hemoglobin 5.1 (LL)  13.0 - 17.0 g/dL   HCT 14.8 (L) 39.0 - 52.0 %   MCV 77.1 (L) 78.0 - 100.0 fL   MCH 26.6 26.0 - 34.0 pg   MCHC 34.5 30.0 - 36.0 g/dL   RDW 16.7 (H) 11.5 - 15.5 %   Platelets 122 (L) 150 - 400 K/uL  Type and screen [place]     Status: None (Preliminary result)   Collection Time: 04/03/17 12:43 PM  Result Value Ref Range   ABO/RH(D) O POS    Antibody Screen NEG    Sample Expiration 04/06/2017    Unit Number [id number]    Blood Component Type RBC LR PHER1    Unit division 00    Status of Unit ALLOCATED    Transfusion Status OK TO TRANSFUSE    Crossmatch Result Compatible    Unit Number [id number]    Blood Component Type RED CELLS,LR    Unit division 00    Status of Unit ALLOCATED    Transfusion Status OK TO TRANSFUSE    Crossmatch Result Compatible   Protime-INR     Status: Abnormal   Collection Time: 04/03/17 12:43 PM  Result Value Ref Range   Prothrombin Time 19.2 (H) 11.4 - 15.2 seconds   INR  1.59   Lipase, blood     Status: Abnormal   Collection Time: 04/03/17 12:43 PM  Result Value Ref Range   Lipase 133 (H) 11 - 51 U/L  ABO/Rh     Status: None   Collection Time: 04/03/17 12:43 PM  Result Value Ref Range   ABO/RH(D) O POS   POC occult blood, ED     Status: Abnormal   Collection Time: 04/03/17 12:57 PM  Result Value Ref Range   Fecal Occult Bld POSITIVE (A) NEGATIVE  Prepare RBC     Status: None   Collection Time: 04/03/17  2:00 PM  Result Value Ref Range   Order Confirmation ORDER PROCESSED BY BLOOD BANK      No results found.  ROS:  As stated above in the HPI otherwise negative.  Blood pressure 114/61, pulse 79, resp. rate 18, weight 111.1 kg (244 lb 14.9 oz), SpO2 98 %.    PE: Gen: NAD, Alert and Oriented, fatigued appearing HEENT:  [place]/AT, EOMI Neck: Supple, no LAD Lungs: CTA Bilaterally CV: RRR without M/G/R ABM: Soft, NTND, +BS Ext: No C/C/E  Assessment/Plan: 1) Anemia. 2) ESRD and refusing dialysis. 3) Hematemesis. 4) Melena. 5) Relative hypotension. 6) Hypocalcemia. 7) Hyperuricemia.   I reviewed the records from the prior hospitalization.  It is unfortunate that he is refusing dialysis as it is life saving.  From a GI standpoint he would benefit from an EGD, however, I discussed the case with anesthesia.  On an emergent basis they would sedate the patient, but they want to avoid any intervention with his severe renal failure.  Anesthesia's recommendation was to maximize his medical status, which means to undergo dialysis.  I understand and respect his wishes about his refusal for dialysis.  He is high risk for death, which I clearly explained to him and his wife, with or without GI intervention.  In fact, with his continued refusal for dialysis only palliative care should be offered as I do not know the endpoint for his care.  His wife desires for him to have dialysis, but he has no interest and I made no attempts to convince him otherwise.  His GI  bleeding is most likely as a result  of his NSAIDs metabolites remaining at a high concentration as a result of his renal failure.  Also he has platelet dysfunction from his hyperuremia.  Plan: 1) Protonix drip. 2) No GI intervention possible at this time. 3) Hospice care.  Gerod Caligiuri D 04/03/2017, 2:21 PM

## 2017-04-03 NOTE — ED Provider Notes (Addendum)
[place] DEPT Provider Note   CSN: [id number] Arrival date & time: 04/03/17  1216     History   Chief Complaint Chief Complaint  Patient presents with  . Hematemesis  . Melena    HPI Patrick Ibarra is a 36 y.o. male with h/o ESRD refusing HD, HTN, anemia, T2DM presents to ED for evaluation of nausea, ematemesis, melena, abdominal pain, light-headedness and shortness of breath. Abdominal pain resolved. No previous h/o GI bleed or PUD. Takes ibuprofen every other day for heel pain, denies heavy ETOH use.  Denies CP, palpitations, dysuria, fevers, exposure to sick contacts, suspicious food, abx or recent travel.   HPI  Past Medical History:  Diagnosis Date  . Hypertension   . LV dysfunction   . Obesity   . Uncontrolled diabetes mellitus [facility])     Patient Active Problem List   Diagnosis Date Noted  . Uremia 04/03/2017  . GI bleed 04/03/2017  . Symptomatic anemia 04/03/2017  . Hypertensive emergency 03/01/2017  . CHF (congestive heart failure) ([place]) 03/01/2017  . Obesity 03/01/2017  . Anemia of chronic kidney failure 03/01/2017  . Essential hypertension 01/05/2014  . Wheezing 12/21/2013  . Renal failure (ARF), acute on chronic (HCC) 12/20/2013  . Diabetes mellitus ([place]) 12/24/2011  . ESRD (end stage renal disease) ([place]) 12/24/2011    Past Surgical History:  Procedure Laterality Date  . TONSILLECTOMY AND ADENOIDECTOMY         Home Medications    Prior to Admission medications   Medication Sig Start Date End Date Taking? Authorizing Provider  amLODipine (NORVASC) 5 MG tablet Take 0.5 tablets (2.5 mg total) by mouth daily. 03/18/17  Yes Ladell Pier, MD  calcitRIOL (ROCALTROL) 0.25 MCG capsule Take 1 capsule (0.25 mcg total) by mouth daily. 03/19/17  Yes Ladell Pier, MD  ferrous sulfate (FERROUSUL) 325 (65 FE) MG tablet Take 1 tablet (325 mg total) by mouth daily with breakfast. 03/21/17  Yes Ladell Pier, MD  furosemide (LASIX) 80 MG tablet  Take 1 tablet (80 mg total) by mouth 2 (two) times daily. 04/04/17 05/04/17 Yes Noel, Tiffany S, PA-C  ibuprofen (ADVIL,MOTRIN) 200 MG tablet Take 600-800 mg by mouth every 6 (six) hours as needed (foot pain).   Yes [provider]  labetalol (NORMODYNE) 300 MG tablet Take 1 tablet (300 mg total) by mouth 2 (two) times daily. 04/04/17  Yes Ena Dawley, Tiffany S, PA-C  sodium bicarbonate 650 MG tablet Take 1 tablet (650 mg total) by mouth 3 (three) times daily. 03/26/17  Yes Ladell Pier, MD  triamcinolone cream (KENALOG) 0.1 % Apply 1 application topically 2 (two) times daily. 03/18/17  Yes Ladell Pier, MD  Blood Glucose Monitoring Suppl (TRUE METRIX METER) w/Device KIT Use as directed 03/18/17   Ladell Pier, MD  calcium carbonate (TUMS) 500 MG chewable tablet 2 tabs PO with each meal and at bedtime. Patient not taking: Reported on 04/03/2017 03/19/17   Ladell Pier, MD  glucose blood (TRUE METRIX BLOOD GLUCOSE TEST) test strip Use as instructed 03/18/17   Ladell Pier, MD  TRUEPLUS LANCETS 28G MISC Use as directed 03/18/17   Ladell Pier, MD    Family History Family History  Problem Relation Age of Onset  . Hypertension Mother   . Diabetes Mother   . Hypertension Father     Social History Social History  Substance Use Topics  . Smoking status: Never Smoker  . Smokeless tobacco: Never Used  . Alcohol  use Yes     Comment: WINE ONCE A WEEK     Allergies   Patient has no known allergies.   Review of Systems Review of Systems  Constitutional: Negative for chills, diaphoresis and fever.  Respiratory: Positive for shortness of breath. Negative for cough.   Cardiovascular: Negative for chest pain and palpitations.  Gastrointestinal: Positive for abdominal pain, blood in stool, diarrhea, nausea and vomiting.       +hematemesis   Genitourinary: Negative for dysuria and hematuria.  Musculoskeletal: Negative for back pain.  Allergic/Immunologic: Positive for  immunocompromised state.  Neurological: Positive for light-headedness. Negative for syncope and headaches.     Physical Exam Updated Vital Signs BP 119/73   Pulse 79   Temp (!) 97.5 F (36.4 C) (Oral)   Resp 18   Wt 111.1 kg (244 lb 14.9 oz)   SpO2 94%   BMI 32.31 kg/m   Physical Exam  Constitutional: He is oriented to person, place, and time. He appears well-developed and well-nourished. No distress.  HENT:  Head: Normocephalic and atraumatic.  Nose: Nose normal.  Mouth/Throat: No oropharyngeal exudate.  Dry mucous membranes   Eyes: Pupils are equal, round, and reactive to light. Conjunctivae and EOM are normal.  Neck: Normal range of motion. Neck supple.  Cardiovascular: Normal rate, regular rhythm, normal heart sounds and intact distal pulses.   No murmur heard. Pulmonary/Chest: Effort normal and breath sounds normal. No respiratory distress. He has no wheezes. He has no rales.  Abdominal: Soft. Bowel sounds are normal. He exhibits no distension. There is no tenderness.  Soft NTND, no suprapubic of CVA tenderness  Genitourinary: Rectal exam shows guaiac positive stool.  Genitourinary Comments:  Chaperone was present.  I was able to palpate the first 2-3cm of the rectum digitally without gross abnormalities or masses.  Patient without pain in perianal/rectal area with palpation.  There are no external fissures or external hemorrhoids noted.  No induration or swelling of the perianal skin.   +Melena    No signs of perirectal abscess.   DRE reveals good sphincter tone.    Musculoskeletal: Normal range of motion. He exhibits no deformity.  Lymphadenopathy:    He has no cervical adenopathy.  Neurological: He is alert and oriented to person, place, and time.  Skin: Skin is warm and dry. Capillary refill takes less than 2 seconds.  Psychiatric: He has a normal mood and affect. His behavior is normal. Judgment and thought content normal.  Nursing note and vitals  reviewed.    ED Treatments / Results  Labs (all labs ordered are listed, but only abnormal results are displayed) Labs Reviewed  COMPREHENSIVE METABOLIC PANEL - Abnormal; Notable for the following:       Result Value   Chloride 99 (*)    CO2 12 (*)    BUN 207 (*)    Creatinine, Ser 43.28 (*)    Calcium 5.5 (*)    Albumin 3.0 (*)    AST 8 (*)    ALT 7 (*)    GFR calc non Af Amer 1 (*)    GFR calc Af Amer 1 (*)    Anion gap 29 (*)    All other components within normal limits  CBC - Abnormal; Notable for the following:    RBC 1.92 (*)    Hemoglobin 5.1 (*)    HCT 14.8 (*)    MCV 77.1 (*)    RDW 16.7 (*)    Platelets 122 (*)  All other components within normal limits  PROTIME-INR - Abnormal; Notable for the following:    Prothrombin Time 19.2 (*)    All other components within normal limits  LIPASE, BLOOD - Abnormal; Notable for the following:    Lipase 133 (*)    All other components within normal limits  POC OCCULT BLOOD, ED - Abnormal; Notable for the following:    Fecal Occult Bld POSITIVE (*)    All other components within normal limits  URINALYSIS, ROUTINE W REFLEX MICROSCOPIC  TYPE AND SCREEN  ABO/RH  PREPARE RBC (CROSSMATCH)    EKG  EKG Interpretation  Date/Time:  Thursday April 03 2017 12:58:59 EDT Ventricular Rate:  83 PR Interval:    QRS Duration: 89 QT Interval:  408 QTC Calculation: 480 R Axis:   44 Text Interpretation:  Sinus rhythm Borderline prolonged QT interval Since last tracing QT has shortened Confirmed by Daleen Bo [phone]) on 04/03/2017 1:12:10 PM       Radiology Ct Abdomen Pelvis Wo Contrast  Result Date: 04/03/2017 CLINICAL DATA:  Abdominal pain, pancreatitis, elevated lipase. EXAM: CT ABDOMEN AND PELVIS WITHOUT CONTRAST TECHNIQUE: Multidetector CT imaging of the abdomen and pelvis was performed following the standard protocol without IV contrast. COMPARISON:  None. FINDINGS: Lower chest: Lung bases are clear. Hepatobiliary:  Unenhanced liver is unremarkable. Mild layering gallstones (series 2/ image 37). No associated inflammatory changes. No intrahepatic or extrahepatic ductal dilatation. Pancreas: Fullness of the pancreatic tail (series 2/ image 21) with associated mild peripancreatic fluid, likely reflecting acute pancreatitis. No pancreatic ductal dilatation. No drainable fluid collection/pseudocyst. Spleen: Within normal limits, noting mild perisplenic ascites. Adrenals/Urinary Tract: Adrenal glands are within normal limits. Bilateral renal atrophy.  No renal calculi or hydronephrosis. Bladder is mildly thick-walled. Stomach/Bowel: Stomach is within normal limits. No evidence of bowel obstruction. Appendix is not discretely visualized. Ascending colon/ cecum is mildly thick-walled. Vascular/Lymphatic: No evidence of abdominal aortic aneurysm. No suspicious abdominopelvic lymphadenopathy. Reproductive: Prostate is grossly unremarkable. Other: Small volume pelvic ascites. Musculoskeletal: Visualized osseous structures are within normal limits. IMPRESSION: Mild peripancreatic inflammatory changes, suggesting acute pancreatitis. No drainable fluid collection/pseudocyst. Mild cholelithiasis. No intrahepatic or extrahepatic ductal dilatation. Mild abdominopelvic ascites. Electronically Signed   By: Julian Hy M.D.   On: 04/03/2017 15:22    Procedures Procedures (including critical care time)  Medications Ordered in ED Medications  iopamidol (ISOVUE-300) 61 % injection 30 mL (not administered)  iopamidol (ISOVUE-300) 61 % injection (not administered)  ondansetron (ZOFRAN) injection 4 mg (4 mg Intravenous Given 04/03/17 1250)  pantoprazole (PROTONIX) injection 40 mg (40 mg Intravenous Given 04/03/17 1250)  sodium chloride 0.9 % bolus 1,000 mL (0 mLs Intravenous Stopped 04/03/17 1448)  0.9 %  sodium chloride infusion (10 mL/hr Intravenous New Bag/Given 04/03/17 1447)   CRITICAL CARE Performed by: Kinnie Feil   Total  critical care time: 80 minutes  Critical care time was exclusive of separately billable procedures and treating other patients.  Critical care was necessary to treat or prevent imminent or life-threatening deterioration.  Critical care was time spent personally by me on the following activities: development of treatment plan with patient and/or surrogate as well as nursing, discussions with consultants, evaluation of patient's response to treatment, examination of patient, obtaining history from patient or surrogate, ordering and performing treatments and interventions, ordering and review of laboratory studies, ordering and review of radiographic studies, pulse oximetry and re-evaluation of patient's condition.   Initial Impression / Assessment and Plan / ED Course  I have  reviewed the triage vital signs and the nursing notes.  Pertinent labs & imaging results that were available during my care of the patient were reviewed by me and considered in my medical decision making (see chart for details).  Clinical Course as of Apr 03 1536  Thu Apr 03, 2017  1330 Hemoglobin: (!!) 5.1 [CG]  1330 Calcium: (!!) 5.5 [CG]  1330 Lipase: (!) 133 [CG]  1330 Fecal Occult Blood, POC: (!) POSITIVE [CG]  1338 Prothrombin Time: (!) 19.2 [CG]  1338 INR: 1.59 [CG]  1338 CO2: (!) 12 [CG]  1426 Creatinine: (!) 43.28 [CG]  1426 BUN: (!) 207 [CG]    Clinical Course User Index [CG] Kinnie Feil, PA-C   36 year old male with history of hypertension, type 2 diabetes mellitus, ESRD refusing dialysis presents to the ED for evaluation of hematemesis, abdominal pain, melena, lightheadedness and shortness of breath onset last night. Abdominal pain resolved. Systolic BPs have been in the low 100s, no tachycardia, tachypnea or hypoxemia. He is symptomatic with exertion, light-headedness. Abdomen is benign without signs of peritonitis. Melena noted on DRE. No h/o clotting disorder, liver dz, varices, ETOH abuse. Does  take ibuprofen every other day for heel pain.  Lab work remarkable for positive Hemoccult, anemia with hemoglobin 5.1, elevated lipase 133, hypocalcemia 5.5, PT/INR 19.2/1.59, bicarb 12.  Creatinine 43, BUN 207. Pending U/A, CT a/p without contrast. Hgb baseline appears to be ~7. EKG unremarkable.  Final Clinical Impressions(s) / ED Diagnoses   Final diagnoses:  Hematemesis with nausea   Concerned for upper GI bleed and pancreatitis. Repeat eval, VS remain acceptable however pt has h/o malignant HTN and SBP 100s probably low for him. Patient was given Protonix, Zofran, 1 L IV fluids in the ED. [facility] consult gastroenterology. Briefly discussed hemodialysis with patient, he still unsure.   1345: Spoke to GI (Dr Benson Norway) who agrees with admission, will eval as inpatient. 1355: Pending hospitalist consult.  1425: Spoke to Dr. Myna Hidalgo who will evaluate patient.   Patient, ED treatment and discharge plan was discussed with supervising physician who also evaluated the patient and is agreeable with plan.   Addend: Attempted to admit to internal med service. Pt was discharged from [facility] ED internal med a month ago, spoke to resident who states beds at [facility] and [facility]. Pt needs nephrology, dialysis, GI evaluation, and SDU. Will transfer ED to ED.   New Prescriptions New Prescriptions   No medications on file      Arlean Hopping 04/03/17 [place], [place], DO 04/03/17 [place], [place], PA-C 04/03/17 [place], DO 04/03/17 2043

## 2017-04-03 NOTE — ED Notes (Signed)
Pt transferred from [facility] for a GI Bleed. Pt has no complaints at this time other then being tired.

## 2017-04-03 NOTE — Consult Note (Signed)
Medical Consultation   Patrick Ibarra  SWF:[id number]  DOB: 1981/01/19  DOA: 04/03/2017  PCP: Ladell Pier, MD   Outpatient Specialists: None  Requesting physician: Carmon Sails, PA  Reason for consultation: Hospital admission for GIB, symptomatic anemia   History of Present Illness: Patrick Ibarra is an 36 y.o. male with severe uncontrolled HTN and secondary ESRD, DM, and chronic diastolic CHF who presents with hematemesis, melena, and lightheadedness on standing. He has refused treatment for his ESRD, had palliative care consultation while in the hospital 1 month ago, indicated that he does not want to start hemodialysis, but also insisted on being kept full code and being dialyzed if he were to arrest. He now presents with a day of bright red emesis and melena, becoming near syncopal with standing. Had been using NSAIDs recently.  In the ED, he was normotensive, BUN is 207, hemoglobin 5.1. 2 units of packed red blood cells were ordered for immediate transfusion. Gastroenterology was consulted by the ED physician, and hospitalists were consulted for admission.  Review of Systems:  ROS As per HPI otherwise 10 point review of systems negative.   Past Medical History: Past Medical History:  Diagnosis Date  . Hypertension   . LV dysfunction   . Obesity   . Uncontrolled diabetes mellitus ([place])     Past Surgical History: Past Surgical History:  Procedure Laterality Date  . TONSILLECTOMY AND ADENOIDECTOMY       Allergies:  No Known Allergies   Social History:  reports that he has never smoked. He has never used smokeless tobacco. He reports that he drinks alcohol. He reports that he does not use drugs.   Family History: Family History  Problem Relation Age of Onset  . Hypertension Mother   . Diabetes Mother   . Hypertension Father     Physical Exam: Vitals:   04/03/17 1300 04/03/17 1330 04/03/17 1400 04/03/17 1440  BP: 119/64 114/61 (!) 109/59  115/69  Pulse: 81 79 78 79  Resp: 18   18  Temp:    (!) 97.5 F (36.4 C)  TempSrc:    Oral  SpO2: 100% 98% 100%   Weight:        Constitutional: Alert and awake, oriented x3, not in any acute distress. Eyes: PERLA, EOMI, irises appear normal, anicteric sclera,  ENMT: external ears and nose appear normal, lips appears normal, oropharynx mucosa, tongue, posterior pharynx appear normal  Neck: neck appears normal, no masses, normal ROM, no thyromegaly, JVP 9 cmH2O CVS: S1-S2, regular rate and rhythm, normal pedal pulses  Respiratory:  clear to auscultation bilaterally, no wheezing, rales or rhonchi. Respiratory effort normal.  Abdomen: soft nontender, nondistended, normal bowel sounds  Musculoskeletal: : no cyanosis or clubbing. No red, hot joints. Neuro: Cranial nerves II-XII intact, strength, sensation, reflexes wnl Psych: Calm and cooperative Skin: scattered hyperpigmented papules and superficial excoriations on background of xerosis,    Data reviewed:  I have personally reviewed following labs and imaging studies Labs:  CBC:  Recent Labs Lab 04/03/17 1243  WBC 7.5  HGB 5.1*  HCT 14.8*  MCV 77.1*  PLT 122*    Basic Metabolic Panel:  Recent Labs Lab 04/03/17 1243  NA 140  K 4.6  CL 99*  CO2 12*  GLUCOSE 89  BUN 207*  CREATININE 43.28*  CALCIUM 5.5*   GFR Estimated Creatinine Clearance: 3.2 mL/min (A) (by C-G formula based  on SCr of 43.28 mg/dL (H)). Liver Function Tests:  Recent Labs Lab 04/03/17 1243  AST 8*  ALT 7*  ALKPHOS 68  BILITOT 0.7  PROT 6.5  ALBUMIN 3.0*    Recent Labs Lab 04/03/17 1243  LIPASE 133*   No results for input(s): AMMONIA in the last 168 hours. Coagulation profile  Recent Labs Lab 04/03/17 1243  INR 1.59    Cardiac Enzymes: No results for input(s): CKTOTAL, CKMB, CKMBINDEX, TROPONINI in the last 168 hours. BNP: Invalid input(s): POCBNP CBG: No results for input(s): GLUCAP in the last 168 hours. D-Dimer No  results for input(s): DDIMER in the last 72 hours. Hgb A1c No results for input(s): HGBA1C in the last 72 hours. Lipid Profile No results for input(s): CHOL, HDL, LDLCALC, TRIG, CHOLHDL, LDLDIRECT in the last 72 hours. Thyroid function studies No results for input(s): TSH, T4TOTAL, T3FREE, THYROIDAB in the last 72 hours.  Invalid input(s): FREET3 Anemia work up No results for input(s): VITAMINB12, FOLATE, FERRITIN, TIBC, IRON, RETICCTPCT in the last 72 hours. Urinalysis    Component Value Date/Time   COLORURINE STRAW (A) 03/01/2017 [place] 03/01/2017 0959   LABSPEC 1.010 03/01/2017 0959   PHURINE 5.0 03/01/2017 0959   GLUCOSEU 50 (A) 03/01/2017 0959   HGBUR SMALL (A) 03/01/2017 0959   BILIRUBINUR NEGATIVE 03/01/2017 0959   KETONESUR NEGATIVE 03/01/2017 0959   PROTEINUR 100 (A) 03/01/2017 0959   UROBILINOGEN 0.2 12/21/2013 1841   NITRITE NEGATIVE 03/01/2017 0959   LEUKOCYTESUR NEGATIVE 03/01/2017 0959     Microbiology No results found for this or any previous visit (from the past 240 hour(s)).     Inpatient Medications:   Scheduled Meds: . iopamidol       Continuous Infusions:   Radiological Exams on Admission: No results found.  Impression/Recommendations  1. GIB, symptomatic anemia  - Pt presents with 1 day of hematemesis and melena in setting of uremia (BUN 207) and recent NSAID use - Hgb is 5.1, down from 6.8 two weeks earlier; FOBT positive  - 2 units of pRBCs ordered for immediate transfusion  - Recommend IV PPI, HD for uremia  - GI is consulting and much appreciated   2. ESRD  - Pt has ESRD, had been refusing HD while also reporting desire for FULL CODE and HD as needed if he codes  - Recommended to patient that we either admit him to [facility] for comfort care and Hospice consultation, or transfer to [facility] for initiation of HD; discussed with his uremia and bleeding, transfusion and GI interventions may only temporize the situation - He agrees  to started HD now and nephrology consultation is recommended  3. HTN  - BP at goal in ED  - Monitor, treat as needed    Thank you for this consultation.     Time Spent: 42 minutes  Vianne Bulls M.D. [facility] 04/03/2017, 2:55 PM

## 2017-04-03 NOTE — Procedures (Signed)
Hemodialysis Catheter Insertion Procedure Note Patrick Ibarra [id number] 1980/10/02  Procedure: Insertion of Hemodialysis Catheter Indications: Hemodialysis  Procedure Details Consent: Risks of procedure as well as the alternatives and risks of each were explained to the (patient/caregiver).  Consent for procedure obtained. Time Out: Verified patient identification, verified procedure, site/side was marked, verified correct patient position, special equipment/implants available, medications/allergies/relevent history reviewed, required imaging and test results available.  Performed  Maximum sterile technique was used including antiseptics, cap, gloves, gown, hand hygiene, mask and sheet. Skin prep: Chlorhexidine; local anesthetic administered A antimicrobial bonded/coated triple lumen catheter was placed in the right internal jugular vein using the Seldinger technique.  Evaluation Blood flow good Complications: No apparent complications Patient did tolerate procedure well. Chest X-ray ordered to verify placement.  CXR: pending.  Procedure performed under direct ultrasound guidance for real time vessel cannulation.      Montey Hora, [facility] Pgr: [phone]  or [phone] 04/03/2017, 11:56 PM  STAFF NOTE  I, Dr Seward Carol Agree with the above note by PA Pt required central venous catheter for renal replacement therapy Consent was obtained, Time out performed and maximal barrier precautions with sterile technique used for line insertion. No complications occurred. I personally reviewed the chest xray post insertion  Confirmed position of Right IJ Hemodialysis catheter  Dr. Seward Carol [facility]  04/04/2017 12:50 AM

## 2017-04-03 NOTE — ED Provider Notes (Signed)
Patient is transferred to [facility] emergency department for [facility] emergency department for admission. Patient has complex medical history of chronic renal failure without however having initiated dialysis. Patient had been resistant to doing so. Yesterday evening and today he developed bloody stool as well as vomited blood. He denies he's any pain at this time. Patient is alert and nontoxic. Mental status is clear. He has no respiratory distress. At this time monitor shows stable vital signs without tachycardia or hypotension. Patient's lungs are grossly clear. Heart regular. Abdomen is nontender. I have called internal medicine teaching service for admission. Reviewed case with Dr. Ignacia Marvel.   Charlesetta Shanks, MD 04/03/17 2129

## 2017-04-03 NOTE — ED Notes (Signed)
This nurse remained at patient's bedside for the entirety of the first 15 minutes of blood transfusion. Patient tolerated well. VSS.   

## 2017-04-03 NOTE — ED Triage Notes (Signed)
Patient BIB EMS from home with c/o hemoptysis starting last night, with EBP of approx 558mL of dark/black blood. Patient states he vomited last night and again this morning, with clots noted. Per EMS report, patient has positive orthostatics and gets diaphoretic and dizzy upon standing. Patient denies recent trauma. Patient was recently discharged from the hospital for hypertension and was put on new antihypertensives.

## 2017-04-03 NOTE — ED Notes (Addendum)
Report called to Felton Clinton at [facility]- states ED physician gave her report already. [facility] called regarding patient pick up. ETA 76min.

## 2017-04-03 NOTE — ED Notes (Signed)
This nurse remained with patient throughout the entirety of the first 15 minutes of blood transfusion. Patient tolerated well. VSS.

## 2017-04-03 NOTE — ED Notes (Addendum)
Date and time results received: 04/03/17 1:29 PM  (use smartphrase ".now" to insert current time)  Test: HGb 5.1, Calcium 5.5 Critical Value:   Name of Provider Notified: Estill Bamberg RN   Orders Received? Or Actions Taken?: made primary RN and Rosemarie Ax EDP aware

## 2017-04-04 ENCOUNTER — Ambulatory Visit: Payer: Self-pay

## 2017-04-04 ENCOUNTER — Inpatient Hospital Stay (HOSPITAL_COMMUNITY): Payer: Medicaid Other | Admitting: Certified Registered Nurse Anesthetist

## 2017-04-04 ENCOUNTER — Encounter (HOSPITAL_COMMUNITY)
Admission: EM | Disposition: A | Discharge status: Home / Self Care | Payer: Self-pay | Source: Home / Self Care | Attending: Internal Medicine

## 2017-04-04 ENCOUNTER — Encounter (HOSPITAL_COMMUNITY): Payer: Self-pay

## 2017-04-04 DIAGNOSIS — N185 Chronic kidney disease, stage 5: Secondary | ICD-10-CM

## 2017-04-04 DIAGNOSIS — I120 Hypertensive chronic kidney disease with stage 5 chronic kidney disease or end stage renal disease: Secondary | ICD-10-CM

## 2017-04-04 DIAGNOSIS — E1122 Type 2 diabetes mellitus with diabetic chronic kidney disease: Secondary | ICD-10-CM

## 2017-04-04 DIAGNOSIS — K859 Acute pancreatitis without necrosis or infection, unspecified: Secondary | ICD-10-CM | POA: Insufficient documentation

## 2017-04-04 HISTORY — PX: ESOPHAGOGASTRODUODENOSCOPY (EGD) WITH PROPOFOL: SHX5813

## 2017-04-04 LAB — BPAM RBC
BLOOD PRODUCT EXPIRATION DATE: 201808302359
Blood Product Expiration Date: 201808302359
ISSUE DATE / TIME: 201808021609
ISSUE DATE / TIME: 201808021428
UNIT TYPE AND RH: 5100
Unit Type and Rh: 5100

## 2017-04-04 LAB — BASIC METABOLIC PANEL
Anion gap: 28 — ABNORMAL HIGH (ref 5–15)
BUN: 209 mg/dL — AB (ref 6–20)
CALCIUM: 5.6 mg/dL — AB (ref 8.9–10.3)
CO2: 12 mmol/L — AB (ref 22–32)
Chloride: 100 mmol/L — ABNORMAL LOW (ref 101–111)
Creatinine, Ser: 43.26 mg/dL — ABNORMAL HIGH (ref 0.61–1.24)
GFR calc Af Amer: 1 mL/min — ABNORMAL LOW (ref >60)
GFR, EST NON AFRICAN AMERICAN: 1 mL/min — AB (ref >60)
GLUCOSE: 73 mg/dL (ref 65–99)
Potassium: 4.6 mmol/L (ref 3.5–5.1)
Sodium: 140 mmol/L (ref 135–145)

## 2017-04-04 LAB — CBC
HCT: 19.3 % — ABNORMAL LOW (ref 39.0–52.0)
HEMATOCRIT: 21.4 % — AB (ref 39.0–52.0)
HEMOGLOBIN: 7.4 g/dL — AB (ref 13.0–17.0)
HEMOGLOBIN: 6.7 g/dL — AB (ref 13.0–17.0)
MCH: 27.7 pg (ref 26.0–34.0)
MCH: 27.6 pg (ref 26.0–34.0)
MCHC: 34.7 g/dL (ref 30.0–36.0)
MCHC: 34.6 g/dL (ref 30.0–36.0)
MCV: 79.9 fL (ref 78.0–100.0)
MCV: 79.8 fL (ref 78.0–100.0)
PLATELETS: 121 10*3/uL — AB (ref 150–400)
Platelets: 106 10*3/uL — ABNORMAL LOW (ref 150–400)
RBC: 2.68 MIL/uL — ABNORMAL LOW (ref 4.22–5.81)
RBC: 2.42 MIL/uL — AB (ref 4.22–5.81)
RDW: 16.4 % — ABNORMAL HIGH (ref 11.5–15.5)
RDW: 15.9 % — AB (ref 11.5–15.5)
WBC: 5 10*3/uL (ref 4.0–10.5)
WBC: 9 10*3/uL (ref 4.0–10.5)

## 2017-04-04 LAB — TYPE AND SCREEN
ABO/RH(D): O POS
Antibody Screen: NEGATIVE
UNIT DIVISION: 0
UNIT DIVISION: 0

## 2017-04-04 LAB — HEPATITIS B SURFACE ANTIGEN: HEP B S AG: NEGATIVE

## 2017-04-04 LAB — RENAL FUNCTION PANEL
ALBUMIN: 2.8 g/dL — AB (ref 3.5–5.0)
ALBUMIN: 2.8 g/dL — AB (ref 3.5–5.0)
ANION GAP: 17 — AB (ref 5–15)
Anion gap: 17 — ABNORMAL HIGH (ref 5–15)
BUN: 103 mg/dL — AB (ref 6–20)
BUN: 103 mg/dL — AB (ref 6–20)
CALCIUM: 7.2 mg/dL — AB (ref 8.9–10.3)
CO2: 20 mmol/L — ABNORMAL LOW (ref 22–32)
CO2: 20 mmol/L — ABNORMAL LOW (ref 22–32)
Calcium: 7.4 mg/dL — ABNORMAL LOW (ref 8.9–10.3)
Chloride: 102 mmol/L (ref 101–111)
Chloride: 102 mmol/L (ref 101–111)
Creatinine, Ser: 22.5 mg/dL — ABNORMAL HIGH (ref 0.61–1.24)
Creatinine, Ser: 22.97 mg/dL — ABNORMAL HIGH (ref 0.61–1.24)
GFR calc Af Amer: 3 mL/min — ABNORMAL LOW (ref >60)
GFR calc Af Amer: 3 mL/min — ABNORMAL LOW (ref >60)
GFR, EST NON AFRICAN AMERICAN: 2 mL/min — AB (ref >60)
GFR, EST NON AFRICAN AMERICAN: 2 mL/min — AB (ref >60)
GLUCOSE: 76 mg/dL (ref 65–99)
GLUCOSE: 73 mg/dL (ref 65–99)
PHOSPHORUS: 5.5 mg/dL — AB (ref 2.5–4.6)
PHOSPHORUS: 5.8 mg/dL — AB (ref 2.5–4.6)
POTASSIUM: 3.2 mmol/L — AB (ref 3.5–5.1)
Potassium: 3.2 mmol/L — ABNORMAL LOW (ref 3.5–5.1)
SODIUM: 139 mmol/L (ref 135–145)
SODIUM: 139 mmol/L (ref 135–145)

## 2017-04-04 LAB — TROPONIN I: TROPONIN I: 0.04 ng/mL — AB (ref <0.03)

## 2017-04-04 LAB — ALT: ALT: 9 U/L — ABNORMAL LOW (ref 17–63)

## 2017-04-04 LAB — PREPARE RBC (CROSSMATCH)

## 2017-04-04 SURGERY — ESOPHAGOGASTRODUODENOSCOPY (EGD) WITH PROPOFOL
Anesthesia: Monitor Anesthesia Care

## 2017-04-04 MED ORDER — SODIUM CHLORIDE 0.9 % IJ SOLN
PREFILLED_SYRINGE | INTRAMUSCULAR | Status: DC | PRN
  Administered 2017-04-04: 2 mL

## 2017-04-04 MED ORDER — SODIUM CHLORIDE 0.9 % IV SOLN
INTRAVENOUS | Status: DC | PRN
  Administered 2017-04-04: 14:00:00 via INTRAVENOUS

## 2017-04-04 MED ORDER — SODIUM CHLORIDE 0.9 % IV SOLN: Freq: Once | INTRAVENOUS | Status: DC

## 2017-04-04 MED ORDER — CALCIUM CHLORIDE 10 % IV SOLN
INTRAVENOUS | Status: DC | PRN
  Administered 2017-04-04: 100 mg via INTRAVENOUS
  Administered 2017-04-04 (×2): 200 mg via INTRAVENOUS

## 2017-04-04 MED ORDER — EPINEPHRINE PF 1 MG/10ML IJ SOSY
PREFILLED_SYRINGE | INTRAMUSCULAR | Status: AC
  Filled 2017-04-04: qty 10

## 2017-04-04 MED ORDER — CALCIUM ACETATE (PHOS BINDER) 667 MG PO CAPS: 667.0000 mg | ORAL_CAPSULE | Freq: Three times a day (TID) | ORAL | Status: DC

## 2017-04-04 MED ORDER — PANTOPRAZOLE SODIUM 40 MG IV SOLR
40.0000 mg | Freq: Two times a day (BID) | INTRAVENOUS | Status: DC
  Administered 2017-04-04 – 2017-04-06 (×4): 40 mg via INTRAVENOUS
  Filled 2017-04-04 (×5): qty 40

## 2017-04-04 MED ORDER — DEXTROSE 50 % IV SOLN
INTRAVENOUS | Status: DC | PRN
  Administered 2017-04-04: 25 mL via INTRAVENOUS

## 2017-04-04 MED ORDER — LORAZEPAM 2 MG/ML IJ SOLN
INTRAMUSCULAR | Status: AC
  Filled 2017-04-04: qty 1

## 2017-04-04 MED ORDER — PROPOFOL 10 MG/ML IV BOLUS
INTRAVENOUS | Status: DC | PRN
  Administered 2017-04-04 (×5): 20 mg via INTRAVENOUS

## 2017-04-04 MED ORDER — LORAZEPAM 2 MG/ML IJ SOLN
1.0000 mg | Freq: Once | INTRAMUSCULAR | Status: AC
  Administered 2017-04-04: 1 mg via INTRAVENOUS

## 2017-04-04 MED ORDER — DARBEPOETIN ALFA 200 MCG/0.4ML IJ SOSY
PREFILLED_SYRINGE | INTRAMUSCULAR | Status: AC
  Administered 2017-04-04: 200 ug via INTRAVENOUS
  Filled 2017-04-04: qty 0.4

## 2017-04-04 MED ORDER — DEXTROSE 50 % IV SOLN
INTRAVENOUS | Status: AC
  Filled 2017-04-04: qty 50

## 2017-04-04 MED ORDER — SODIUM CHLORIDE 0.9 % IV SOLN
510.0000 mg | Freq: Once | INTRAVENOUS | Status: DC
  Filled 2017-04-04: qty 17

## 2017-04-04 MED ORDER — NEPRO/CARBSTEADY PO LIQD
237.0000 mL | Freq: Three times a day (TID) | ORAL | Status: DC
  Administered 2017-04-05 – 2017-04-09 (×9): 237 mL via ORAL
  Filled 2017-04-04: qty 237

## 2017-04-04 MED ORDER — PROPOFOL 500 MG/50ML IV EMUL
INTRAVENOUS | Status: DC | PRN
  Administered 2017-04-04: 25 ug/kg/min via INTRAVENOUS

## 2017-04-04 MED ORDER — RENA-VITE PO TABS
1.0000 | ORAL_TABLET | Freq: Every day | ORAL | Status: DC
  Administered 2017-04-05 – 2017-04-10 (×6): 1 via ORAL
  Filled 2017-04-04 (×6): qty 1

## 2017-04-04 SURGICAL SUPPLY — 14 items

## 2017-04-04 NOTE — Progress Notes (Addendum)
Admin Ativan 1mg  IVP and unable to have witness to waste as I am here alone and nurses from connecting unit have stated they are unable to witness my waste as it gives an error message when they do.  I have previously made pharmacy aware of this and they have previously advised me to write a progress note.  Wasted in sharps container attached to machine 13

## 2017-04-04 NOTE — Progress Notes (Signed)
   Subjective: Mr. Gonzaga is doing okay this morning. Received 2 hours of HD last night. Felt that it was difficult to sit still for so long, but otherwise tolerated HD okay. Otherwise no chest pain or trouble breathing. He notes that he has had decreased appetite for the last few weeks and has not been feeling well overall. Wife also notes that he has been mentating slower than normal recently. We encouraged that even though initiating HD may be very difficult, with HD, he will start feeling better soon.  Objective:  Vital signs in last 24 hours: Vitals:   04/04/17 1348 04/04/17 1350 04/04/17 1355 04/04/17 1400  BP:    (!) 165/85  Pulse: 86   89  Resp: 11   13  Temp:    98.6 F (37 C)  TempSrc:    Oral  SpO2: 99% 98% 100% 100%  Weight:      Height:       GEN: Well-nourished African-American male sitting comfortably in bed in NAD HENT: [place]/AT. Moist mucous membranes. No visible lesions. EYES: EOMI. Sclera anicteric. RESP: CTAB. No wheezes or rhonchi CV: NR & RR. Soft systolic crescendo/decrescendo murmur ABD: Soft. Non-tender. Non-distended. Normoactive bowel sounds. EXT: No edema. Warm and well perfused. NEURO: Cranial nerves II-XII grossly intact. No focal deficits or weakness. PSYCH: Patient is calm and pleasant. Appropriate affect. Well-groomed; speech is appropriate and on-subject.  HbBSAg neg WBC 9, Hgb 6.7, HCT 19.3, plt 121  CT abdomen pelvis 8/2 1. Mild peripancreatic inflammatory changes, suggesting acute pancreatitis. No drainable fluid collection/pseudocyst. 2. Mild cholelithiasis. No intrahepatic or extrahepatic ductal dilatation. 3. Mild abdominopelvic ascites.  Assessment/Plan:  Principal Problem:   GI bleed Active Problems:   ESRD (end stage renal disease) (HCC)   Essential hypertension   Uremia   Symptomatic anemia   Hypocalcemia  36 yo male with PMH of ESRD, HTN, T2DM, presents with new onset hematemesis and hematochezia beginning yesterday, pt  needed dialysis one month ago but refused, pt amenable to dialysis on this admission.  # GI bleed: likely upper GI bleed, pt with new onset hematemesis and hematochezia beginning yesterday evening. Likely precipitated by poor platelet function 2/2 uremia - HD initiated last night - IV protonix 40mg  BID - Consider adding desmopressin - GI consulted, appreciate their help --- EGD this afternoon, f/u results  # ESRD Stage 5, now s/p HD last night - Nephrology following, appreciate recs --- Second treatment today, third Saturday - Vascular consulted for permanent access placement and tunneled access placement in near future  # HTN: 2/2 ESRD. BP most recently 165/85 - Hold anti-HTN while undergoing EGD and HD  # Uremia: Initiated HD yesterday. plt 121 - HD today and tomorrow - Consider desmopressin if bleeding continues  # Symptomatic anemia, stable. Most recent Hb 6.7 - pRBC as needed for Hb <7 - IV iron & Aranesp  # Hypocalcemia: 2/2 CKD, Ca 5.6 - start phoslo when eating - calcitriol - when eating, renal diet low in phos  Dispo: Anticipated discharge in approximately 5-7 day(s).   Colbert Ewing, MD  Internal Medicine, PGY-1 04/04/2017, 2:02 PM Pager: Mamie Nick [phone]

## 2017-04-04 NOTE — Transfer of Care (Signed)
Immediate Anesthesia Transfer of Care Note  Patient: Patrick Ibarra  Procedure(s) Performed: Procedure(s): ESOPHAGOGASTRODUODENOSCOPY (EGD) WITH PROPOFOL (N/A)  Patient Location: Endoscopy Unit  Anesthesia Type:MAC  Level of Consciousness: awake, patient cooperative and responds to stimulation  Airway & Oxygen Therapy: Patient Spontanous Breathing and Patient connected to nasal cannula oxygen  Post-op Assessment: Report given to RN, Post -op Vital signs reviewed and stable and Patient moving all extremities X 4  Post vital signs: Reviewed and stable  Last Vitals:  Vitals:   04/04/17 1458 04/04/17 1500  BP:    Pulse:  85  Resp:  (!) 26  Temp: 36.9 C     Last Pain:  Vitals:   04/04/17 1500  TempSrc:   PainSc: Asleep         Complications: No apparent anesthesia complications

## 2017-04-04 NOTE — ED Notes (Signed)
Blood consent misplaced after transfer to dialysis Tori RN aware.

## 2017-04-04 NOTE — Interval H&P Note (Signed)
History and Physical Interval Note:  04/04/2017 1:57 PM   The patient consented to dialysis and anesthesia is willing to sedate the patient.  Patrick Ibarra  has presented today for surgery, with the diagnosis of gastrointestinal bleeding  The various methods of treatment have been discussed with the patient and family. After consideration of risks, benefits and other options for treatment, the patient has consented to  Procedure(s): ESOPHAGOGASTRODUODENOSCOPY (EGD) WITH PROPOFOL (N/A) as a surgical intervention .  [name] history has been reviewed, patient examined, no change in status, stable for surgery.  I have reviewed the patient's chart and labs.  Questions were answered to the patient's satisfaction.     Patrick Ibarra

## 2017-04-04 NOTE — Progress Notes (Signed)
Right as pt arrived to unit, transporting RN received called to transport Pt to dialysis immediately. Will assess when pt return. Isac Caddy, RN

## 2017-04-04 NOTE — Progress Notes (Signed)
New Admission Note:   Arrival Method: From Dialysis via bed Mental Orientation: A&Ox4  Assessment: Completed Skin: Intact IV: L FA, L Hand, HD R Triple lumen IJ Pain: denies any pain Tubes: None Safety Measures: Safety Fall Prevention Plan has been discussed  Admission [place] Orientation: Patient has been orientated to the room, unit and staff.  Family: wife at bedside  Orders to be reviewed and implemented. Will continue to monitor the patient. Call light has been placed within reach and bed alarm has been activated. Tele applied. CCMD notified     Isac Caddy, RN

## 2017-04-04 NOTE — Anesthesia Procedure Notes (Signed)
Procedure Name: MAC Date/Time: 04/04/2017 2:20 PM Performed by: Tressia Miners LEFFEW Pre-anesthesia Checklist: Patient identified, Emergency Drugs available, Suction available, Timeout performed and Patient being monitored Patient Re-evaluated:Patient Re-evaluated prior to induction Oxygen Delivery Method: Nasal cannula Placement Confirmation: positive ETCO2

## 2017-04-04 NOTE — Progress Notes (Signed)
Subjective:  Resting and wife at bedside "It went" Objective Vital signs in last 24 hours: Vitals:   04/04/17 0300 04/04/17 0305 04/04/17 0312 04/04/17 0400  BP: (!) 91/41 (!) 105/54 114/64 129/68  Pulse: 87 87 84 84  Resp: 20 14 10 12   Temp:    97.7 F (36.5 C)  TempSrc:    Oral  SpO2: 100% 100% 100% 98%  Weight:   112 kg (246 lb 14.6 oz) 109.7 kg (241 lb 13.5 oz)  Height:    6\' 4"  (1.93 m)   Weight change:   Intake/Output Summary (Last 24 hours) at 04/04/17 1135 Last data filed at 04/04/17 0400  Gross per 24 hour  Intake          2340.25 ml  Output              500 ml  Net          1840.25 ml    Assessment/Plan:  1. ESRD: clearly uremic.   CCM c/s for HD cath, appreciate assistance.  HD in the middle of the night, plan second treatment for today, third for Saturday.  Will order vein mapping and consult for permanent access placement as well as tunneled access placement in the near future- next week would probably be fine as I anticipate will still be here then.   2 Acute GI bleed, likely upper: on PPI gtt, GI consulted.  I suspect uremic platelets not helping matters.  S/p 2 u [facility]RBCs at [facility].  Will transfuse prn Hgb < 7- plan for today.  Scoping plans per GI 3 Hypertension: Normally uncontrolled hypertensive, relatively hypotensive for him in setting of GI bleed, will hold antihypertensives for now, expect pressures to become overall more controlled with UF 4. Anemia of ESRD/ABLA: Will start aranesp in addition to pRBCs, IV iron likely needed too 5. Metabolic Bone Disease: PTH 1495, phos 7/2 was 10.2.  Start phoslo when eating.  Resume calcitriol 6.  Nutrition: will need RD c/s on low phos diet 7.  Acute pancreatitis: mildly elevated at 133.  Supportive care     Huyen Perazzo A    Labs: Basic Metabolic Panel:  Recent Labs Lab 04/03/17 1243 04/04/17 0045  NA 140 140  K 4.6 4.6  CL 99* 100*  CO2 12* 12*  GLUCOSE 89 73  BUN 207* 209*  CREATININE 43.28*  43.26*  CALCIUM 5.5* 5.6*   Liver Function Tests:  Recent Labs Lab 04/03/17 1243 04/04/17 0045  AST 8*  --   ALT 7* 9*  ALKPHOS 68  --   BILITOT 0.7  --   PROT 6.5  --   ALBUMIN 3.0*  --     Recent Labs Lab 04/03/17 1243  LIPASE 133*   No results for input(s): AMMONIA in the last 168 hours. CBC:  Recent Labs Lab 04/03/17 1243 04/03/17 2324 04/04/17 0045  WBC 7.5  --  9.0  HGB 5.1* 6.7* 6.7*  HCT 14.8* 19.8* 19.3*  MCV 77.1*  --  79.8  PLT 122*  --  121*   Cardiac Enzymes:  Recent Labs Lab 04/03/17 2324  TROPONINI 0.04*   CBG: No results for input(s): GLUCAP in the last 168 hours.  Iron Studies: No results for input(s): IRON, TIBC, TRANSFERRIN, FERRITIN in the last 72 hours. Studies/Results: Ct Abdomen Pelvis Wo Contrast  Result Date: 04/03/2017 CLINICAL DATA:  Abdominal pain, pancreatitis, elevated lipase. EXAM: CT ABDOMEN AND PELVIS WITHOUT CONTRAST TECHNIQUE: Multidetector CT imaging of the abdomen and pelvis was  performed following the standard protocol without IV contrast. COMPARISON:  None. FINDINGS: Lower chest: Lung bases are clear. Hepatobiliary: Unenhanced liver is unremarkable. Mild layering gallstones (series 2/ image 37). No associated inflammatory changes. No intrahepatic or extrahepatic ductal dilatation. Pancreas: Fullness of the pancreatic tail (series 2/ image 21) with associated mild peripancreatic fluid, likely reflecting acute pancreatitis. No pancreatic ductal dilatation. No drainable fluid collection/pseudocyst. Spleen: Within normal limits, noting mild perisplenic ascites. Adrenals/Urinary Tract: Adrenal glands are within normal limits. Bilateral renal atrophy.  No renal calculi or hydronephrosis. Bladder is mildly thick-walled. Stomach/Bowel: Stomach is within normal limits. No evidence of bowel obstruction. Appendix is not discretely visualized. Ascending colon/ cecum is mildly thick-walled. Vascular/Lymphatic: No evidence of abdominal aortic  aneurysm. No suspicious abdominopelvic lymphadenopathy. Reproductive: Prostate is grossly unremarkable. Other: Small volume pelvic ascites. Musculoskeletal: Visualized osseous structures are within normal limits. IMPRESSION: Mild peripancreatic inflammatory changes, suggesting acute pancreatitis. No drainable fluid collection/pseudocyst. Mild cholelithiasis. No intrahepatic or extrahepatic ductal dilatation. Mild abdominopelvic ascites. Electronically Signed   By: Julian Hy M.D.   On: 04/03/2017 15:22   Dg Chest Port 1 View  Result Date: 04/04/2017 CLINICAL DATA:  Dialysis patient. EXAM: PORTABLE CHEST 1 VIEW COMPARISON:  03/01/2017 FINDINGS: AP right central venous catheter has been placed with tip over the cavoatrial junction region. No pneumothorax. Mild cardiac enlargement. Normal pulmonary vascularity. Lungs appear clear and expanded. No blunting of costophrenic angles. Mediastinal contours appear intact. IMPRESSION: Right central venous line placed with tip over the cavoatrial junction region. No pneumothorax. Electronically Signed   By: Lucienne Capers M.D.   On: 04/04/2017 00:09   Medications: Infusions: . sodium chloride    . sodium chloride    . sodium chloride    . pantoprozole (PROTONIX) infusion 8 mg/hr (04/04/17 1024)    Scheduled Medications: . calcitRIOL  0.25 mcg Oral Daily  . darbepoetin (ARANESP) injection - DIALYSIS  200 mcg Intravenous Q Thu-HD  . LORazepam      . sodium bicarbonate  650 mg Oral TID  . triamcinolone cream  1 application Topical BID    have reviewed scheduled and prn medications.  Physical Exam: General: somnolent but arousable  Heart: RRR Lungs: poor effort Abdomen: obese, distended, slightly tender Extremities: mild edema Dialysis Access: right vascath placed 8/2    04/04/2017,11:35 AM  LOS: 1 day

## 2017-04-04 NOTE — Progress Notes (Addendum)
MD and Dialysis RN notified of critical calcium level 5.6. Pt currently in dialysis. No new orders at this time. Will continue to monitor pt upon arrival to unit. Isac Caddy, RN

## 2017-04-04 NOTE — Anesthesia Preprocedure Evaluation (Signed)
Anesthesia Evaluation  Patient identified by MRN, date of birth, ID band Patient awake    Reviewed: Allergy & Precautions, NPO status , Patient's Chart, lab work & pertinent test results  History of Anesthesia Complications Negative for: history of anesthetic complications  Airway Mallampati: III  TM Distance: >3 FB Neck ROM: Full    Dental  (+) Teeth Intact   Pulmonary  To be evaluated for OSA   + rhonchi        Cardiovascular hypertension, +CHF   Rhythm:Regular     Neuro/Psych negative neurological ROS  negative psych ROS   GI/Hepatic GI bleed suspected   Endo/Other  diabetesMorbid obesity  Renal/GU ESRF and DialysisRenal diseaseAcute SERF with dialysis x1 last night     Musculoskeletal   Abdominal   Peds  Hematology  (+) anemia , Severe anemia, type and cross present, hemodynamics stable   Anesthesia Other Findings   Reproductive/Obstetrics                             Anesthesia Physical Anesthesia Plan  ASA: IV  Anesthesia Plan: MAC   Post-op Pain Management:    Induction:   PONV Risk Score and Plan: 1 and Treatment may vary due to age or medical condition  Airway Management Planned: Nasal Cannula  Additional Equipment: None  Intra-op Plan:   Post-operative Plan:   Informed Consent: I have reviewed the patients History and Physical, chart, labs and discussed the procedure including the risks, benefits and alternatives for the proposed anesthesia with the patient or authorized representative who has indicated his/her understanding and acceptance.   Dental advisory given  Plan Discussed with: CRNA and Surgeon  Anesthesia Plan Comments:         Anesthesia Quick Evaluation

## 2017-04-04 NOTE — H&P (View-Only) (Signed)
Reason for Consult: Hematemesis, Melena, Anemia, ESRD Referring Physician: [facility]  Patrick Ibarra [place] HPI: This is a 36 year old male with Stage V ESRD secondary to uncontrolled HTN, DM, and diastolic CHF who is admitted to the hospital with hematemesis and melena yesterday.  Last week he used some Aleve for his plantar fascitis, and he thinks he used 4-5 tablets.  He reported some issues with melena since last month, but it has been intermittent.  His wife found evidence of hematemesis and melena in the toilet today and she concluded that this occurred yesterday.  When she found the bleeding she brought him directly to the ER.  He was recently discharged from the hospital for his ESRD.  In the past he was followed by renal, but then he did not follow up and he was not compliant with his antihypertensives.  Multiple members of the medical staff tried to convince him to change his mind about accepting hemodialysis, but he continues to refuse.  A Palliative Care consultation was obtained and subsequently he was discharged.  His HGB on admission today is at 5.1 g/dL and last month it was in the 7 range.  His BP, which previously was very hypertensive is not normotensive.  He denies any issues with chest pain or SOB.  The current BUN is at 207 and creatinine is at 43.  Past Medical History:  Diagnosis Date  . Hypertension   . LV dysfunction   . Obesity   . Uncontrolled diabetes mellitus ([place])     Past Surgical History:  Procedure Laterality Date  . TONSILLECTOMY AND ADENOIDECTOMY      Family History  Problem Relation Age of Onset  . Hypertension Mother   . Diabetes Mother   . Hypertension Father     Social History:  reports that he has never smoked. He has never used smokeless tobacco. He reports that he drinks alcohol. He reports that he does not use drugs.  Allergies: No Known Allergies  Medications:  Scheduled: . iopamidol       Continuous: . pantoprozole (PROTONIX) infusion       Results for orders placed or performed during the hospital encounter of 04/03/17 (from the past 24 hour(s))  Comprehensive metabolic panel     Status: Abnormal   Collection Time: 04/03/17 12:43 PM  Result Value Ref Range   Sodium 140 135 - 145 mmol/L   Potassium 4.6 3.5 - 5.1 mmol/L   Chloride 99 (L) 101 - 111 mmol/L   CO2 12 (L) 22 - 32 mmol/L   Glucose, Bld 89 65 - 99 mg/dL   BUN 207 (H) 6 - 20 mg/dL   Creatinine, Ser 43.28 (H) 0.61 - 1.24 mg/dL   Calcium 5.5 (LL) 8.9 - 10.3 mg/dL   Total Protein 6.5 6.5 - 8.1 g/dL   Albumin 3.0 (L) 3.5 - 5.0 g/dL   AST 8 (L) 15 - 41 U/L   ALT 7 (L) 17 - 63 U/L   Alkaline Phosphatase 68 38 - 126 U/L   Total Bilirubin 0.7 0.3 - 1.2 mg/dL   GFR calc non Af Amer 1 (L) >60 mL/min   GFR calc Af Amer 1 (L) >60 mL/min   Anion gap 29 (H) 5 - 15  CBC     Status: Abnormal   Collection Time: 04/03/17 12:43 PM  Result Value Ref Range   WBC 7.5 4.0 - 10.5 K/uL   RBC 1.92 (L) 4.22 - 5.81 MIL/uL   Hemoglobin 5.1 (LL)  13.0 - 17.0 g/dL   HCT 14.8 (L) 39.0 - 52.0 %   MCV 77.1 (L) 78.0 - 100.0 fL   MCH 26.6 26.0 - 34.0 pg   MCHC 34.5 30.0 - 36.0 g/dL   RDW 16.7 (H) 11.5 - 15.5 %   Platelets 122 (L) 150 - 400 K/uL  Type and screen [place]     Status: None (Preliminary result)   Collection Time: 04/03/17 12:43 PM  Result Value Ref Range   ABO/RH(D) O POS    Antibody Screen NEG    Sample Expiration 04/06/2017    Unit Number [id number]    Blood Component Type RBC LR PHER1    Unit division 00    Status of Unit ALLOCATED    Transfusion Status OK TO TRANSFUSE    Crossmatch Result Compatible    Unit Number [id number]    Blood Component Type RED CELLS,LR    Unit division 00    Status of Unit ALLOCATED    Transfusion Status OK TO TRANSFUSE    Crossmatch Result Compatible   Protime-INR     Status: Abnormal   Collection Time: 04/03/17 12:43 PM  Result Value Ref Range   Prothrombin Time 19.2 (H) 11.4 - 15.2 seconds   INR  1.59   Lipase, blood     Status: Abnormal   Collection Time: 04/03/17 12:43 PM  Result Value Ref Range   Lipase 133 (H) 11 - 51 U/L  ABO/Rh     Status: None   Collection Time: 04/03/17 12:43 PM  Result Value Ref Range   ABO/RH(D) O POS   POC occult blood, ED     Status: Abnormal   Collection Time: 04/03/17 12:57 PM  Result Value Ref Range   Fecal Occult Bld POSITIVE (A) NEGATIVE  Prepare RBC     Status: None   Collection Time: 04/03/17  2:00 PM  Result Value Ref Range   Order Confirmation ORDER PROCESSED BY BLOOD BANK      No results found.  ROS:  As stated above in the HPI otherwise negative.  Blood pressure 114/61, pulse 79, resp. rate 18, weight 111.1 kg (244 lb 14.9 oz), SpO2 98 %.    PE: Gen: NAD, Alert and Oriented, fatigued appearing HEENT:  [place]/AT, EOMI Neck: Supple, no LAD Lungs: CTA Bilaterally CV: RRR without M/G/R ABM: Soft, NTND, +BS Ext: No C/C/E  Assessment/Plan: 1) Anemia. 2) ESRD and refusing dialysis. 3) Hematemesis. 4) Melena. 5) Relative hypotension. 6) Hypocalcemia. 7) Hyperuricemia.   I reviewed the records from the prior hospitalization.  It is unfortunate that he is refusing dialysis as it is life saving.  From a GI standpoint he would benefit from an EGD, however, I discussed the case with anesthesia.  On an emergent basis they would sedate the patient, but they want to avoid any intervention with his severe renal failure.  Anesthesia's recommendation was to maximize his medical status, which means to undergo dialysis.  I understand and respect his wishes about his refusal for dialysis.  He is high risk for death, which I clearly explained to him and his wife, with or without GI intervention.  In fact, with his continued refusal for dialysis only palliative care should be offered as I do not know the endpoint for his care.  His wife desires for him to have dialysis, but he has no interest and I made no attempts to convince him otherwise.  His GI  bleeding is most likely as a result  of his NSAIDs metabolites remaining at a high concentration as a result of his renal failure.  Also he has platelet dysfunction from his hyperuremia.  Plan: 1) Protonix drip. 2) No GI intervention possible at this time. 3) Hospice care.  Shanica Castellanos D 04/03/2017, 2:21 PM

## 2017-04-04 NOTE — Progress Notes (Signed)
HD tx completed @ 1427 w/o problem, UF goal met, blood rinsed back, VSS, report called to Dianne Dun, RN

## 2017-04-04 NOTE — Progress Notes (Signed)
Contacted MD to notified that pt's hgb 6.7 and blood transfusion cancelled by nephrologist. Pt VSS and currently asymptomatic. No new orders placed at this time. Will continue to monitor. Isac Caddy, RN

## 2017-04-04 NOTE — Progress Notes (Signed)
Initial Nutrition Assessment  DOCUMENTATION CODES:   Not applicable  INTERVENTION:   Nepro Shake po TID, each supplement provides 425 kcal and 19 grams protein  Renal MVI  NUTRITION DIAGNOSIS:   Increased nutrient needs related to chronic illness (ESRD on HD) as evidenced by increased estimated needs from protein.  GOAL:   Patient will meet greater than or equal to 90% of their needs  MONITOR:   PO intake, Supplement acceptance, Labs, Weight trends, I & O's  REASON FOR ASSESSMENT:   Malnutrition Screening Tool    ASSESSMENT:    36 year old male with Stage V ESRD secondary to uncontrolled HTN, DM, and diastolic CHF who is admitted to the hospital with hematemesis and melena    Pt with ESRD previously refusing dialysis for a month but was eventually initiated on HD 8/2. Pt scheduled to have another treatment this afternoon. Temporary HD cath placed 8/2; plan for tunneled dialysis catheter and AV fistula or AV graft after GI work-up.   Pt with possible pancreatitis and GIB. Pt with hematochezia and hematemesis. FOBT positive. GI consult pending. Possible EGD today. Pt with elevated lipase.   Met with pt and family member (wife) in room today. Pt sleeping at time of RD visit so history obtained from family member. Pt with decreased appetite for a month pta. Per chart, pt has lost 26lbs(10%) in 1 month which is severe. Pt has not yet started drinking protein supplements; RD suggested Nepro. Per MD note, pt reports recent taste changes. Pt with elevated Phosphorus 7/3; Phoslo with meals. RD discussed low and high Phosphorus foods with wife and left handouts for pt. Pt currently NPO for possible EGD; RD will order supplements for when diet advanced.     Medications reviewed and include: calcitriol, darbopoetin, Na Bicarb, Phoslo with meals  Labs reviewed: Cl 100(L), BUN 209(H), creat 43.26(H), Ca 5.6(L) Lipase- 133(H) P- 10.2(H)- 7/3 Hgb 6.7(L), Hct 19.3(L)  Nutrition-Focused  physical exam completed. Findings are no fat depletion, no muscle depletion, and mild edema in BLE.   Diet Order:  Diet NPO time specified  Skin:  Reviewed, no issues  Last BM:  8/3  Height:   Ht Readings from Last 1 Encounters:  04/04/17 6' 4" (1.93 m)    Weight:   Wt Readings from Last 1 Encounters:  04/04/17 241 lb 13.5 oz (109.7 kg)    Ideal Body Weight:  91.8 kg  BMI:  Body mass index is 29.44 kg/m.  Estimated Nutritional Needs:   Kcal:  2500-2800kcal/day   Protein:  142-164g/day   Fluid:  >2.5L/day   EDUCATION NEEDS:   No education needs identified at this time  Casey Campbell MS, RD, LDN Pager #- [phone] After Hours Pager: [phone]  

## 2017-04-04 NOTE — Procedures (Signed)
Patient was seen on dialysis and the procedure was supervised.  BFR 200  Via PC BP is  172/91.   Patient appears to be tolerating treatment well  Patrick Ibarra A 04/04/2017

## 2017-04-04 NOTE — Progress Notes (Signed)
HD tx initiated via HD cath w/o problem, pull/push/flush equally w/o problem, VSS, will cont to monitor while on HD tx 

## 2017-04-04 NOTE — Op Note (Signed)
[facility] Patient Name: Patrick Ibarra Procedure Date : 04/04/2017 MRN: [id number] Attending MD: Carol Ada , MD Date of Birth: Dec 10, 1980 CSN: [id number] Age: 36 Admit Type: Inpatient Procedure:                Upper GI endoscopy Indications:              Hematemesis, Melena Providers:                Carol Ada, MD, Cleda Daub, RN, Angus Seller, Corliss Parish, Technician Referring MD:              Medicines:                Propofol per Anesthesia Complications:            No immediate complications. Estimated Blood Loss:     Estimated blood loss was moderate. Procedure:                Pre-Anesthesia Assessment:                           - Prior to the procedure, a History and Physical                            was performed, and patient medications and                            allergies were reviewed. [name] tolerance of                            previous anesthesia was also reviewed. The risks                            and benefits of the procedure and the sedation                            options and risks were discussed with the patient.                            All questions were answered, and informed consent                            was obtained. Prior Anticoagulants: The patient has                            taken no previous anticoagulant or antiplatelet                            agents. ASA Grade Assessment: IV - A patient with                            severe systemic disease that is a constant threat  to life. After reviewing the risks and benefits,                            the patient was deemed in satisfactory condition to                            undergo the procedure.                           - Sedation was administered by an anesthesia                            professional. Deep sedation was attained.                           After obtaining informed consent, the  endoscope was                            passed under direct vision. Throughout the                            procedure, the patient's blood pressure, pulse, and                            oxygen saturations were monitored continuously. The                            EG-[id number] ([id number]) scope was introduced through the                            mouth, and advanced to the second part of duodenum.                            The upper GI endoscopy was technically difficult                            and complex. The patient tolerated the procedure                            well. Scope In: Scope Out: Findings:      A bleeding Mallory-Weiss tear with stigmata of recent bleeding was       found. Area was successfully injected with 2 mL of a 1:10,000 solution       of epinephrine for hemostasis. To stop active bleeding, eight hemostatic       clips were successfully placed (MR unsafe). There was no bleeding at the       end of the procedure.      Red blood was found in the gastric fundus.      The examined duodenum was normal.      In the distal esophagus there was evidence of two Mallory-Weiss tears.       The larger tear had a large overlying clot and several hemoclips were       deployed prophylactically. No bleeding was induced. The smaller and       distal tear  was actively oozing. Four hemoclips across the area secured       the site and no further bleeding was noted with prolonged observation. Impression:               - Mallory-Weiss tear. Injected. Clips (MR unsafe)                            were placed.                           - Red blood in the gastric fundus.                           - Normal examined duodenum.                           - No specimens collected. Moderate Sedation:      None Recommendation:           - Trnasfer patient to Stepdown for a higher level                            of care. I discussed this issue with the Intern                           - NPO.                            - Continue present medications.                           - Continue with dialysis. Procedure Code(s):        --- Professional ---                           [phone], Esophagogastroduodenoscopy, flexible,                            transoral; with control of bleeding, any method Diagnosis Code(s):        --- Professional ---                           K22.6, Gastro-esophageal laceration-hemorrhage                            syndrome                           K92.2, Gastrointestinal hemorrhage, unspecified                           K92.0, Hematemesis                           K92.1, Melena (includes Hematochezia) CPT copyright 2016 American [employer] Association. All rights reserved. The codes documented in this report are preliminary and upon coder review may  be revised to meet current compliance requirements. Carol Ada, MD Carol Ada, MD 04/04/2017 3:13:58 PM This report has been signed electronically.  Number of Addenda: 0 

## 2017-04-04 NOTE — ED Notes (Signed)
Blood consent signed and obtained

## 2017-04-04 NOTE — Consult Note (Signed)
Patient name: Patrick Ibarra MRN: [id number] DOB: 02-Jan-1981 Sex: male   REASON FOR CONSULT:    Needs tunneled dialysis catheter and AV fistula or AV graft. The consult is requested by Dr. Moshe Cipro.  HPI:   Patrick Ibarra is a pleasant 36 y.o. male,  who was admitted last night with hematemesis, dizziness, and melena. He has no previous history of GI bleeds. The patient had been admitted with a hypertensive emergency on June 30. Nephrology saw the patient at that time he was found to have stage V chronic kidney disease. It was felt that he would need dialysis. He refused dialysis through that admission and palliative care was consult. On this admission, the patient states that he is amenable to dialysis.  The patient had a temporary right IJ dialysis catheter placed last night by CCM. Vein mapping has been ordered.  The patient is right-handed. His main complaints are some weight loss, poor appetite, fatigue, and some dyspnea on exertion.  Patient is also noted to have an elevated lipase and possible pancreatitis. He also has significant anemia. He has not yet received any blood according to his wife.  GI has been consult.  Past Medical History:  Diagnosis Date  . Hypertension   . LV dysfunction   . Obesity   . Uncontrolled diabetes mellitus ([place])     Family History  Problem Relation Age of Onset  . Hypertension Mother   . Diabetes Mother   . Hypertension Father     SOCIAL HISTORY: Social History   Social History  . Marital status: Married    Spouse name: N/A  . Number of children: N/A  . Years of education: N/A   Occupational History  . Not on file.   Social History Main Topics  . Smoking status: Never Smoker  . Smokeless tobacco: Never Used  . Alcohol use Yes     Comment: WINE ONCE A WEEK  . Drug use: No  . Sexual activity: Not on file   Other Topics Concern  . Not on file   Social History Narrative   He is married with step kids. He works at [employer].     No Known Allergies  Current Facility-Administered Medications  Medication Dose Route Frequency Provider Last Rate Last Dose  . 0.9 %  sodium chloride infusion   Intravenous Once Madelon Lips, MD      . 0.9 %  sodium chloride infusion  100 mL Intravenous PRN Madelon Lips, MD      . 0.9 %  sodium chloride infusion  100 mL Intravenous PRN Madelon Lips, MD      . alteplase (CATHFLO ACTIVASE) injection 2 mg  2 mg Intracatheter Once PRN Madelon Lips, MD      . calcitRIOL (ROCALTROL) capsule 0.25 mcg  0.25 mcg Oral Daily Collier Salina, MD   0.25 mcg at 04/04/17 0920  . Darbepoetin Alfa (ARANESP) injection 200 mcg  200 mcg Intravenous Q Thu-HD Madelon Lips, MD   200 mcg at 04/04/17 0205  . iopamidol (ISOVUE-300) 61 % injection 30 mL  30 mL Oral Once PRN Deno Etienne, DO      . lidocaine (PF) (XYLOCAINE) 1 % injection 5 mL  5 mL Intradermal PRN Madelon Lips, MD      . lidocaine-prilocaine (EMLA) cream 1 application  1 application Topical PRN Madelon Lips, MD      . LORazepam (ATIVAN) 2 MG/ML injection           .  pantoprazole (PROTONIX) 80 mg in sodium chloride 0.9 % 250 mL (0.32 mg/mL) infusion  8 mg/hr Intravenous Continuous Carol Ada, MD 25 mL/hr at 04/04/17 1024 8 mg/hr at 04/04/17 1024  . pentafluoroprop-tetrafluoroeth (GEBAUERS) aerosol 1 application  1 application Topical PRN Madelon Lips, MD      . sodium bicarbonate tablet 650 mg  650 mg Oral TID Collier Salina, MD   650 mg at 04/04/17 0920  . triamcinolone cream (KENALOG) 0.1 % 1 application  1 application Topical BID Collier Salina, MD   1 application at 70/35/00 1000    REVIEW OF SYSTEMS:  [X]  denotes positive finding, [ ]  denotes negative finding Cardiac  Comments:  Chest pain or chest pressure:    Shortness of breath upon exertion: X   Short of breath when lying flat:    Irregular heart rhythm:        Vascular    Pain in calf, thigh, or hip brought on by ambulation:     Pain in feet at night that wakes you up from your sleep:     Blood clot in your veins:    Leg swelling:  X Bilateral       Pulmonary    Oxygen at home:    Productive cough:     Wheezing:         Neurologic    Sudden weakness in arms or legs:     Sudden numbness in arms or legs:     Sudden onset of difficulty speaking or slurred speech:    Temporary loss of vision in one eye:     Problems with dizziness:         Gastrointestinal    Blood in stool:  X   Vomited blood:  X       Genitourinary    Burning when urinating:     Blood in urine:        Psychiatric    Major depression:         Hematologic    Bleeding problems:    Problems with blood clotting too easily:        Skin    Rashes or ulcers:        Constitutional    Fever or chills:     PHYSICAL EXAM:   Vitals:   04/04/17 0300 04/04/17 0305 04/04/17 0312 04/04/17 0400  BP: (!) 91/41 (!) 105/54 114/64 129/68  Pulse: 87 87 84 84  Resp: 20 14 10 12   Temp:    97.7 F (36.5 C)  TempSrc:    Oral  SpO2: 100% 100% 100% 98%  Weight:   246 lb 14.6 oz (112 kg) 241 lb 13.5 oz (109.7 kg)  Height:    6\' 4"  (1.93 m)    GENERAL: The patient is a well-nourished male, in no acute distress. The vital signs are documented above. CARDIAC: There is a regular rate and rhythm.  VASCULAR: He has a right IJ catheter and I cannot assess or a right carotid bruit. I do not appreciate a left carotid bruit. He has a brachial and radial pulse bilaterally. He has dorsalis pedis pulses bilaterally. He has bilateral lower extremity swelling. He has an IV on the dorsal aspect of his left hand and an IV in the mid forearm on the left. PULMONARY: There is good air exchange bilaterally without wheezing or rales. ABDOMEN: Soft and non-tender with normal pitched bowel sounds.  MUSCULOSKELETAL: There are no major deformities or cyanosis. NEUROLOGIC: No focal  weakness or paresthesias are detected. SKIN: There are no ulcers or rashes  noted. PSYCHIATRIC: The patient has a normal affect.  DATA:    VEIN MAP: Vein mapping has been ordered and is pending.  LABS:   Potassium is 4.6.  GFR is 1.  Hemoglobin is 6.7.  Platelet count 121,000.  Lipase is 133.  MEDICAL ISSUES:   END-STAGE RENAL DISEASE: The patient presents with a GI bleed. Currently he is very weak and somewhat lethargic. His hemoglobin is 6.7. GI has been consult in and I believe they plan endoscopy. He is also point to be transfused according to the wife. He has a temporary dialysis catheter which she can use for now for dialysis. I would be reluctant to take him to the operating room for placement of a tunneled dialysis catheter and access in his arm currently as we would have to heparinize him. I would like to wait until after his GI workup is complete to be sure that it is safe to proceed. In the meantime, I will follow up on his vein map. If he has reasonable veins in the left arm will have to move his IVs to the right arm. Currently he seems very weak and will need to be stronger before we can proceed safely with surgery. I have discussed the situation and our plans with the patient's wife.  Deitra Mayo [facility] [phone]

## 2017-04-04 NOTE — ED Notes (Signed)
Blood consent

## 2017-04-05 ENCOUNTER — Encounter (HOSPITAL_COMMUNITY): Payer: Self-pay | Admitting: Gastroenterology

## 2017-04-05 ENCOUNTER — Encounter (HOSPITAL_COMMUNITY): Payer: Self-pay

## 2017-04-05 DIAGNOSIS — K920 Hematemesis: Secondary | ICD-10-CM

## 2017-04-05 DIAGNOSIS — K226 Gastro-esophageal laceration-hemorrhage syndrome: Principal | ICD-10-CM

## 2017-04-05 LAB — RENAL FUNCTION PANEL
ALBUMIN: 2.6 g/dL — AB (ref 3.5–5.0)
Anion gap: 17 — ABNORMAL HIGH (ref 5–15)
BUN: 116 mg/dL — AB (ref 6–20)
CO2: 20 mmol/L — ABNORMAL LOW (ref 22–32)
Calcium: 7.1 mg/dL — ABNORMAL LOW (ref 8.9–10.3)
Chloride: 101 mmol/L (ref 101–111)
Creatinine, Ser: 24.93 mg/dL — ABNORMAL HIGH (ref 0.61–1.24)
GFR calc Af Amer: 2 mL/min — ABNORMAL LOW (ref >60)
GFR, EST NON AFRICAN AMERICAN: 2 mL/min — AB (ref >60)
Glucose, Bld: 91 mg/dL (ref 65–99)
PHOSPHORUS: 7.2 mg/dL — AB (ref 2.5–4.6)
Potassium: 3.4 mmol/L — ABNORMAL LOW (ref 3.5–5.1)
Sodium: 138 mmol/L (ref 135–145)

## 2017-04-05 LAB — CBC
HCT: 23.6 % — ABNORMAL LOW (ref 39.0–52.0)
HEMATOCRIT: 21.1 % — AB (ref 39.0–52.0)
HEMATOCRIT: 19.9 % — AB (ref 39.0–52.0)
HEMOGLOBIN: 7.2 g/dL — AB (ref 13.0–17.0)
HEMOGLOBIN: 6.8 g/dL — AB (ref 13.0–17.0)
Hemoglobin: 8.1 g/dL — ABNORMAL LOW (ref 13.0–17.0)
MCH: 27.5 pg (ref 26.0–34.0)
MCH: 27.5 pg (ref 26.0–34.0)
MCH: 27.2 pg (ref 26.0–34.0)
MCHC: 34.1 g/dL (ref 30.0–36.0)
MCHC: 34.2 g/dL (ref 30.0–36.0)
MCHC: 34.3 g/dL (ref 30.0–36.0)
MCV: 80.5 fL (ref 78.0–100.0)
MCV: 80.6 fL (ref 78.0–100.0)
MCV: 79.2 fL (ref 78.0–100.0)
PLATELETS: 95 10*3/uL — AB (ref 150–400)
Platelets: 107 10*3/uL — ABNORMAL LOW (ref 150–400)
Platelets: 109 10*3/uL — ABNORMAL LOW (ref 150–400)
RBC: 2.62 MIL/uL — AB (ref 4.22–5.81)
RBC: 2.47 MIL/uL — AB (ref 4.22–5.81)
RBC: 2.98 MIL/uL — ABNORMAL LOW (ref 4.22–5.81)
RDW: 15.6 % — AB (ref 11.5–15.5)
RDW: 15.7 % — ABNORMAL HIGH (ref 11.5–15.5)
RDW: 17 % — AB (ref 11.5–15.5)
WBC: 7.6 10*3/uL (ref 4.0–10.5)
WBC: 8.2 10*3/uL (ref 4.0–10.5)
WBC: 7 10*3/uL (ref 4.0–10.5)

## 2017-04-05 LAB — HEPATITIS B CORE ANTIBODY, TOTAL: HEP B C TOTAL AB: NEGATIVE

## 2017-04-05 LAB — HEPATITIS B SURFACE ANTIBODY,QUALITATIVE: Hep B S Ab: NONREACTIVE

## 2017-04-05 LAB — PREPARE RBC (CROSSMATCH)

## 2017-04-05 LAB — TROPONIN I: TROPONIN I: 0.1 ng/mL — AB (ref <0.03)

## 2017-04-05 MED ORDER — ONDANSETRON HCL 4 MG/2ML IJ SOLN
4.0000 mg | Freq: Once | INTRAMUSCULAR | Status: AC
  Administered 2017-04-05: 4 mg via INTRAVENOUS
  Filled 2017-04-05: qty 2

## 2017-04-05 MED ORDER — ONDANSETRON HCL 4 MG PO TABS: 4.0000 mg | ORAL_TABLET | Freq: Three times a day (TID) | ORAL | Status: DC | PRN

## 2017-04-05 MED ORDER — SODIUM CHLORIDE 0.9 % IV SOLN: Freq: Once | INTRAVENOUS | Status: DC

## 2017-04-05 MED ORDER — AMLODIPINE BESYLATE 5 MG PO TABS
2.5000 mg | ORAL_TABLET | Freq: Every day | ORAL | Status: DC
  Administered 2017-04-05 – 2017-04-09 (×5): 2.5 mg via ORAL
  Administered 2017-04-10: 5 mg via ORAL
  Filled 2017-04-05 (×6): qty 1

## 2017-04-05 NOTE — Progress Notes (Signed)
Report attempted for HD, RN in patient room.  Will re attempt report shortly.

## 2017-04-05 NOTE — Progress Notes (Signed)
CRITICAL VALUE ALERT  Critical Value:  Hemoglobin 6.8  Date & Time Notied:  04/05/17 8:30am  Provider Notified: Teaching Service  Orders Received/Actions taken: transfusion already ordered

## 2017-04-05 NOTE — Progress Notes (Signed)
Dialysis treatment completed.  3500 mL ultrafiltrated.  2000 mL net fluid removal.  Patient status unchanged. Lung sounds diminished to ausculation in all fields. BLE 1+ pitting edema. Cardiac: NSR.  Cleansed RIJ catheter with chlorhexidine.  Disconnected lines and flushed ports with saline per protocol.  Ports locked with heparin and capped per protocol.    Report given to bedside, RN Anderson Malta.

## 2017-04-05 NOTE — Consult Note (Signed)
[facility] Progress Note  Chief Complaint:    Upper GI bleed  Subjective: Feels okay. No nausea. No overt bleeding  Objective:  Vital signs in last 24 hours: Temp:  [98.2 F (36.8 C)-98.8 F (37.1 C)] 98.3 F (36.8 C) (08/04 1407) Pulse Rate:  [84-101] 88 (08/04 1410) Resp:  [12-24] 21 (08/04 1407) BP: (155-179)/(89-106) 158/93 (08/04 1410) SpO2:  [97 %-100 %] 97 % (08/04 1036) Weight:  [232 lb 9.4 oz (105.5 kg)-239 lb 13.8 oz (108.8 kg)] 232 lb 9.4 oz (105.5 kg) (08/04 1407) Last BM Date: 04/04/17 General:   Alert, well-developed, black male in NAD EENT:  Normal hearing, non icteric sclera, conjunctive pink.  Heart:  Regular rate and rhythm, 1-2 + BLE edema Pulm: Normal respiratory effort Abdomen:  Soft, nondistended, nontender.  Normal bowel sounds, no masses felt. Neurologic:  Alert and  oriented x4;  grossly normal neurologically. Psych:  Pleasant, cooperative.  Normal mood and affect.   Intake/Output from previous day: 08/03 0701 - 08/04 0700 In: 734 [I.V.:400; Blood:334] Out: 901 [Urine:400; Stool:1] Intake/Output this shift: Total I/O In: 315 [Blood:315] Out: 2000 [Other:2000]  Lab Results:  Recent Labs  04/04/17 1849 04/05/17 0149 04/05/17 0747  WBC 5.0 7.6 8.2  HGB 7.4* 7.2* 6.8*  HCT 21.4* 21.1* 19.9*  PLT 106* 107* 109*   BMET  Recent Labs  04/04/17 1849 04/04/17 2031 04/05/17 1116  NA 139 139 138  K 3.2* 3.2* 3.4*  CL 102 102 101  CO2 20* 20* 20*  GLUCOSE 76 73 91  BUN 103* 103* 116*  CREATININE 22.50* 22.97* 24.93*  CALCIUM 7.2* 7.4* 7.1*   LFT  Recent Labs  04/03/17 1243 04/04/17 0045  04/05/17 1116  PROT 6.5  --   --   --   ALBUMIN 3.0*  --   < > 2.6*  AST 8*  --   --   --   ALT 7* 9*  --   --   ALKPHOS 68  --   --   --   BILITOT 0.7  --   --   --   < > = values in this interval not displayed. PT/INR  Recent Labs  04/03/17 1243  LABPROT 19.2*  INR 1.59   Hepatitis Panel  Recent Labs  04/04/17 0045  HEPBSAG Negative    Dg Chest Port 1 View  Result Date: 04/04/2017 CLINICAL DATA:  Dialysis patient. EXAM: PORTABLE CHEST 1 VIEW COMPARISON:  03/01/2017 FINDINGS: AP right central venous catheter has been placed with tip over the cavoatrial junction region. No pneumothorax. Mild cardiac enlargement. Normal pulmonary vascularity. Lungs appear clear and expanded. No blunting of costophrenic angles. Mediastinal contours appear intact. IMPRESSION: Right central venous line placed with tip over the cavoatrial junction region. No pneumothorax. Electronically Signed   By: Lucienne Capers M.D.   On: 04/04/2017 00:09    ASSESSMENT / PLAN:   36 yo male with UGI bleed / MWT, s/p EGD with epi injection and hemoclip placement.  Hemostasis achieved. No further bleeding since procedure.  -starting clears -continue BID PPI. Tomorrow he can transition to PO PPI  ABL, s/p 6 uprbc. Hgb stable but still low at 6.8. Perhaps he can get another unit during next dialysis session  ESRD, recently started HD. Cr down from 42 to 24.    Principal Problem:   GI bleed Active Problems:   ESRD (end stage renal disease) ([place])   Essential hypertension   Uremia   Symptomatic anemia  Hypocalcemia    LOS: 2 days   Tye Savoy ,NP 04/05/2017, 5:07 PM  Pager number [phone]   Attending physician's note   I have taken an interval history, reviewed the chart and examined the patient. I agree with the Advanced Practitioner's note, impression and recommendations.  S/p dialysis this afternoon. Hgb is slightly lower but patient denies any further episodes of nausea and vomiting Monitor Hgb daily and transfuse as needed Continue PPI BID and Zofran prn Clear liquid diet  K Denzil Magnuson, MD [phone] Mon-Fri 8a-5p [phone] after 5p, weekends, holidays

## 2017-04-05 NOTE — Progress Notes (Signed)
Subjective:  Had EGD, Mallory Weiss tear- treated and injected, got 2 units of blood, then followed by his second HD treatment- removed on ly 500 BP stayed high  Objective Vital signs in last 24 hours: Vitals:   04/04/17 1901 04/04/17 1904 04/04/17 1945 04/04/17 2025  BP: (!) 178/89  (!) 173/97 (!) 169/96  Pulse: 89  91 91  Resp: 14   12  Temp: 98.2 F (36.8 C)   98.8 F (37.1 C)  TempSrc: Oral   Oral  SpO2: 100%   100%  Weight:  108.8 kg (239 lb 13.8 oz)    Height:       Weight change: -2.3 kg (-5 lb 1.1 oz)  Intake/Output Summary (Last 24 hours) at 04/05/17 0748 Last data filed at 04/04/17 2026  Gross per 24 hour  Intake              734 ml  Output              901 ml  Net             -167 ml    Assessment/Plan:  1. ESRD: clearly uremic.   CCM c/s for HD cath, appreciate assistance.  HD late 8/2, second treatment 8/3, third for today, fourth Monday.  Have done vein mapping and consult for permanent access placement as well as tunneled access placement- next week would probably be fine as I anticipate will still be here then and needs to be more stable. CLIP in process  2 Acute GI bleed, likely upper: on PPI gtt, GI consulted, EGD with Mallory- Weiss tear- uremic platelets not helping matters.  S/p 2 u pRBCs at [facility].  Another 2 units 8/3, will give another 2 units with HD today as still showing evidence of bleed 3 Hypertension: Normally uncontrolled hypertensive, relatively hypotensive for him in setting of GI bleed, but now back up- will add back norvasc 5 4. Anemia of ESRD/ABLA: Started aranesp in addition to pRBCs 5. Metabolic Bone Disease: PTH 1495, phos 7/2 was 10.2.  Start phoslo when eating.  Resume calcitriol- will need to change to q HD once know schedule 6.  Nutrition: will need RD c/s on low phos diet 7.  Acute pancreatitis: mildly elevated at 133.  Supportive care     Ravynn Hogate A    Labs: Basic Metabolic Panel:  Recent Labs Lab 04/04/17 0045  04/04/17 1849 04/04/17 2031  NA 140 139 139  K 4.6 3.2* 3.2*  CL 100* 102 102  CO2 12* 20* 20*  GLUCOSE 73 76 73  BUN 209* 103* 103*  CREATININE 43.26* 22.50* 22.97*  CALCIUM 5.6* 7.2* 7.4*  PHOS  --  5.5* 5.8*   Liver Function Tests:  Recent Labs Lab 04/03/17 1243 04/04/17 0045 04/04/17 1849 04/04/17 2031  AST 8*  --   --   --   ALT 7* 9*  --   --   ALKPHOS 68  --   --   --   BILITOT 0.7  --   --   --   PROT 6.5  --   --   --   ALBUMIN 3.0*  --  2.8* 2.8*    Recent Labs Lab 04/03/17 1243  LIPASE 133*   No results for input(s): AMMONIA in the last 168 hours. CBC:  Recent Labs Lab 04/03/17 1243  04/04/17 0045 04/04/17 1849 04/05/17 0149  WBC 7.5  --  9.0 5.0 7.6  HGB 5.1*  < > 6.7* 7.4* 7.2*  HCT 14.8*  < > 19.3* 21.4* 21.1*  MCV 77.1*  --  79.8 79.9 80.5  PLT 122*  --  121* 106* 107*  < > = values in this interval not displayed. Cardiac Enzymes:  Recent Labs Lab 04/03/17 2324 04/05/17 0149  TROPONINI 0.04* 0.10*   CBG: No results for input(s): GLUCAP in the last 168 hours.  Iron Studies: No results for input(s): IRON, TIBC, TRANSFERRIN, FERRITIN in the last 72 hours. Studies/Results: Ct Abdomen Pelvis Wo Contrast  Result Date: 04/03/2017 CLINICAL DATA:  Abdominal pain, pancreatitis, elevated lipase. EXAM: CT ABDOMEN AND PELVIS WITHOUT CONTRAST TECHNIQUE: Multidetector CT imaging of the abdomen and pelvis was performed following the standard protocol without IV contrast. COMPARISON:  None. FINDINGS: Lower chest: Lung bases are clear. Hepatobiliary: Unenhanced liver is unremarkable. Mild layering gallstones (series 2/ image 37). No associated inflammatory changes. No intrahepatic or extrahepatic ductal dilatation. Pancreas: Fullness of the pancreatic tail (series 2/ image 21) with associated mild peripancreatic fluid, likely reflecting acute pancreatitis. No pancreatic ductal dilatation. No drainable fluid collection/pseudocyst. Spleen: Within normal  limits, noting mild perisplenic ascites. Adrenals/Urinary Tract: Adrenal glands are within normal limits. Bilateral renal atrophy.  No renal calculi or hydronephrosis. Bladder is mildly thick-walled. Stomach/Bowel: Stomach is within normal limits. No evidence of bowel obstruction. Appendix is not discretely visualized. Ascending colon/ cecum is mildly thick-walled. Vascular/Lymphatic: No evidence of abdominal aortic aneurysm. No suspicious abdominopelvic lymphadenopathy. Reproductive: Prostate is grossly unremarkable. Other: Small volume pelvic ascites. Musculoskeletal: Visualized osseous structures are within normal limits. IMPRESSION: Mild peripancreatic inflammatory changes, suggesting acute pancreatitis. No drainable fluid collection/pseudocyst. Mild cholelithiasis. No intrahepatic or extrahepatic ductal dilatation. Mild abdominopelvic ascites. Electronically Signed   By: Julian Hy M.D.   On: 04/03/2017 15:22   Dg Chest Port 1 View  Result Date: 04/04/2017 CLINICAL DATA:  Dialysis patient. EXAM: PORTABLE CHEST 1 VIEW COMPARISON:  03/01/2017 FINDINGS: AP right central venous catheter has been placed with tip over the cavoatrial junction region. No pneumothorax. Mild cardiac enlargement. Normal pulmonary vascularity. Lungs appear clear and expanded. No blunting of costophrenic angles. Mediastinal contours appear intact. IMPRESSION: Right central venous line placed with tip over the cavoatrial junction region. No pneumothorax. Electronically Signed   By: Lucienne Capers M.D.   On: 04/04/2017 00:09   Medications: Infusions: . sodium chloride    . sodium chloride    . sodium chloride    . sodium chloride    . ferumoxytol      Scheduled Medications: . calcitRIOL  0.25 mcg Oral Daily  . darbepoetin (ARANESP) injection - DIALYSIS  200 mcg Intravenous Q Thu-HD  . feeding supplement (NEPRO CARB STEADY)  237 mL Oral TID BM  . multivitamin  1 tablet Oral QHS  . pantoprazole (PROTONIX) IV  40 mg  Intravenous Q12H  . sodium bicarbonate  650 mg Oral TID  . triamcinolone cream  1 application Topical BID    have reviewed scheduled and prn medications.  Physical Exam: General: pt seems dejected  Heart: RRR Lungs: poor effort Abdomen: obese, distended, slightly tender Extremities: mild edema Dialysis Access: right vascath placed 8/2    04/05/2017,7:48 AM  LOS: 2 days

## 2017-04-05 NOTE — Progress Notes (Signed)
Called to the room due to patient bout of hematemesis. Emesis is compose of clots some dark blood, no bright red blood. Patient was nauseous which has now passed following the vomiting.  On-call physician notified and IV prn nausea med requested. Will continue to monitor.

## 2017-04-05 NOTE — Progress Notes (Signed)
   VASCULAR SURGERY ASSESSMENT & PLAN:   Results of EGD noted. He has a Mallory Weis tear. Transfused yesterday.   He had a vein map on 03/03/17 so I cancelled the one for today. His vein map shows: Forearm and upper arm cephalic vein on right look reasonable and a little bigger than cephalic vein in left arm. Since IV's are in left arm, will leave them there and plan AVF in right arm next week when he is stronger and it is safe to give heparin intraoperatively. Will change temp cath to a [facility] also.   SUBJECTIVE:   Stronger today.   PHYSICAL EXAM:   Vitals:   04/04/17 1901 04/04/17 1904 04/04/17 1945 04/04/17 2025  BP: (!) 178/89  (!) 173/97 (!) 169/96  Pulse: 89  91 91  Resp: 14   12  Temp: 98.2 F (36.8 C)   98.8 F (37.1 C)  TempSrc: Oral   Oral  SpO2: 100%   100%  Weight:  239 lb 13.8 oz (108.8 kg)    Height:       Palpable radial pulses.   LABS:   Lab Results  Component Value Date   WBC 8.2 04/05/2017   HGB 6.8 (LL) 04/05/2017   HCT 19.9 (L) 04/05/2017   MCV 80.6 04/05/2017   PLT 109 (L) 04/05/2017   Lab Results  Component Value Date   CREATININE 22.97 (H) 04/04/2017   Lab Results  Component Value Date   INR 1.59 04/03/2017   CBG (last 3)  No results for input(s): GLUCAP in the last 72 hours.  PROBLEM LIST:    Principal Problem:   GI bleed Active Problems:   ESRD (end stage renal disease) (HCC)   Essential hypertension   Uremia   Symptomatic anemia   Hypocalcemia   CURRENT MEDS:   . amLODipine  2.5 mg Oral QHS  . calcitRIOL  0.25 mcg Oral Daily  . darbepoetin (ARANESP) injection - DIALYSIS  200 mcg Intravenous Q Thu-HD  . feeding supplement (NEPRO CARB STEADY)  237 mL Oral TID BM  . multivitamin  1 tablet Oral QHS  . pantoprazole (PROTONIX) IV  40 mg Intravenous Q12H  . sodium bicarbonate  650 mg Oral TID  . triamcinolone cream  1 application Topical BID    Gae Gallop Beeper: [phone] Office: [phone] 04/05/2017

## 2017-04-05 NOTE — Progress Notes (Signed)
   Subjective:  Patient with episode of hematemesis x1 overnight. Reportedly clots and dark digested blood. No bright red blood seen. Reports melanic stools since admission, two overnight. Denies any nausea currently. Reports some fatigue. Denies any chest pain or shortness of breath.   Objective:  Vital signs in last 24 hours: Vitals:   04/04/17 1901 04/04/17 1904 04/04/17 1945 04/04/17 2025  BP: (!) 178/89  (!) 173/97 (!) 169/96  Pulse: 89  91 91  Resp: 14   12  Temp: 98.2 F (36.8 C)   98.8 F (37.1 C)  TempSrc: Oral   Oral  SpO2: 100%   100%  Weight:  239 lb 13.8 oz (108.8 kg)    Height:       GENERAL- alert, co-operative, obese male, appears as stated age, not in any distress. CARDIAC- RRR, 2/6 systolic murmur at aortic area, no rubs or gallops. RESP- Moving equal volumes of air, and clear to auscultation bilaterally, no wheezes or crackles. ABDOMEN- Soft, nontender, bowel sounds present. EXTREMITIES- bilateral pitting edema to mid shins.  Assessment/Plan:  Mallory-Weiss Tear: EGD done yesterday showed a bleeding Mallory-Weiss tear with evidence of recent bleeding. GI injected epinephrine for hemostasis and then placed eight hemostatic clips to stop active bleeding. Patient will need to be held NPO per GI recommendations given the extent of the tear. Will consult nutrition. On Protonix 40 mg IV bid. S/p 4 units PRBC. Hgb 7.2 this morning. Plan for additional 2 units PRBC in dialysis today given his active bleeding.  He was initially hypotensive (normally uncontrolled HTN) in the setting of GI bleed but has since improved with SBP in the 160-170s. His bleeding is complicated by his severe uremia with uremic platelet dysfunction. Can consider adding desmopressin if needed. Following serial Hgb levels until stable and bleeding controlled. Transfuse <7.0. . Appreciate GI and Nephrology assistance on this case.   ESRD Stage 5 with uremia > now on dialysis Patient received 2 hours  dialysis overnight on 8/2-8/3. Additional 3 hours of dialysis yesterday on 8/3. Plan for additional 4 hour session today. Vascular consulted for access. Had mapping done at previous hospitalization in early July. Plan for permanent access next week when patient is more stable.   HTN: Now hypertensive with SBP in 160-170s. Amlodipine 5 mg restarted by nephrology. Continue to monitor.   Anemia of Chronic Disease: Started on IV iron and Aranesp in addition to his transfusions.   # Hypocalcemia: 2/2 CKD, Ca 5.6 Start phoslo when eating. Resume calcitriol.  Pseudonormalization of the inferolateral T waves on ECG:  Initial Trop 0.04. Repeat this am 0.10. Likely related to his underlying renal disease. It is possible he has had demand ischemia associated with his GI bleed but I think this is less likely than a mild troponin anemia secondary to his renal failure.   Dispo: Anticipated discharge deferred pending clinical improvement   Maryellen Pile, MD 04/05/2017, 10:10 AM Pager: [phone]

## 2017-04-05 NOTE — Progress Notes (Signed)
Patient arrived to unit by bed.  Reviewed treatment plan and this RN agrees with plan.  Report received from bedside RN, Anderson Malta.  Consent verified.  Patient A & O X 4.   Lung sounds diminished to ausculation in all fields. BLE 1+ edema. Cardiac:  NSR.  Removed caps and cleansed RIJ catheter with chlorhedxidine.  Aspirated ports of heparin and flushed them with saline per protocol.  Connected and secured lines, initiated treatment at 1107.  UF Goal of 3500 and net fluid removal 2 L.  2 units PRBC for admin during tx.  Will continue to monitor.

## 2017-04-06 DIAGNOSIS — Z992 Dependence on renal dialysis: Secondary | ICD-10-CM

## 2017-04-06 DIAGNOSIS — D649 Anemia, unspecified: Secondary | ICD-10-CM

## 2017-04-06 DIAGNOSIS — N186 End stage renal disease: Secondary | ICD-10-CM

## 2017-04-06 DIAGNOSIS — K2971 Gastritis, unspecified, with bleeding: Secondary | ICD-10-CM

## 2017-04-06 DIAGNOSIS — K920 Hematemesis: Secondary | ICD-10-CM

## 2017-04-06 DIAGNOSIS — N19 Unspecified kidney failure: Secondary | ICD-10-CM

## 2017-04-06 LAB — GLUCOSE, CAPILLARY
GLUCOSE-CAPILLARY: 93 mg/dL (ref 65–99)
GLUCOSE-CAPILLARY: 118 mg/dL — AB (ref 65–99)

## 2017-04-06 LAB — TYPE AND SCREEN
ABO/RH(D): O POS
Antibody Screen: NEGATIVE
UNIT DIVISION: 0
UNIT DIVISION: 0
Unit division: 0
Unit division: 0

## 2017-04-06 LAB — CBC
HCT: 21.2 % — ABNORMAL LOW (ref 39.0–52.0)
HEMOGLOBIN: 7.4 g/dL — AB (ref 13.0–17.0)
MCH: 27.8 pg (ref 26.0–34.0)
MCHC: 34.9 g/dL (ref 30.0–36.0)
MCV: 79.7 fL (ref 78.0–100.0)
PLATELETS: 103 10*3/uL — AB (ref 150–400)
RBC: 2.66 MIL/uL — AB (ref 4.22–5.81)
RDW: 17.1 % — ABNORMAL HIGH (ref 11.5–15.5)
WBC: 7.7 10*3/uL (ref 4.0–10.5)

## 2017-04-06 LAB — BPAM RBC
Blood Product Expiration Date: 201809042359
Blood Product Expiration Date: 201809042359
Blood Product Expiration Date: 201809052359
Blood Product Expiration Date: 201809052359
ISSUE DATE / TIME: 201808031258
ISSUE DATE / TIME: 201808031258
ISSUE DATE / TIME: 201808041112
ISSUE DATE / TIME: 201808041112
UNIT TYPE AND RH: 5100
UNIT TYPE AND RH: 5100
UNIT TYPE AND RH: 5100
UNIT TYPE AND RH: 5100

## 2017-04-06 LAB — RENAL FUNCTION PANEL
ALBUMIN: 2.7 g/dL — AB (ref 3.5–5.0)
ANION GAP: 15 (ref 5–15)
BUN: 80 mg/dL — ABNORMAL HIGH (ref 6–20)
CALCIUM: 7.8 mg/dL — AB (ref 8.9–10.3)
CO2: 23 mmol/L (ref 22–32)
CREATININE: 18.16 mg/dL — AB (ref 0.61–1.24)
Chloride: 99 mmol/L — ABNORMAL LOW (ref 101–111)
GFR calc Af Amer: 3 mL/min — ABNORMAL LOW (ref >60)
GFR, EST NON AFRICAN AMERICAN: 3 mL/min — AB (ref >60)
Glucose, Bld: 92 mg/dL (ref 65–99)
PHOSPHORUS: 4.9 mg/dL — AB (ref 2.5–4.6)
Potassium: 3.1 mmol/L — ABNORMAL LOW (ref 3.5–5.1)
SODIUM: 137 mmol/L (ref 135–145)

## 2017-04-06 LAB — MAGNESIUM: Magnesium: 1.7 mg/dL (ref 1.7–2.4)

## 2017-04-06 MED ORDER — LABETALOL HCL 200 MG PO TABS
200.0000 mg | ORAL_TABLET | Freq: Two times a day (BID) | ORAL | Status: DC
  Administered 2017-04-06 – 2017-04-11 (×10): 200 mg via ORAL
  Filled 2017-04-06 (×10): qty 1

## 2017-04-06 MED ORDER — CLONIDINE HCL 0.1 MG PO TABS: 0.1000 mg | ORAL_TABLET | Freq: Three times a day (TID) | ORAL | Status: DC | PRN

## 2017-04-06 MED ORDER — PANTOPRAZOLE SODIUM 40 MG PO TBEC
40.0000 mg | DELAYED_RELEASE_TABLET | Freq: Every day | ORAL | Status: DC
Start: 2017-04-06 — End: 2017-04-07
  Administered 2017-04-06 – 2017-04-07 (×2): 40 mg via ORAL
  Filled 2017-04-06 (×2): qty 1

## 2017-04-06 MED ORDER — CALCITRIOL 0.5 MCG PO CAPS
0.5000 ug | ORAL_CAPSULE | Freq: Every day | ORAL | Status: DC
  Administered 2017-04-07 – 2017-04-09 (×3): 0.5 ug via ORAL
  Filled 2017-04-06 (×2): qty 1
  Filled 2017-04-06: qty 2

## 2017-04-06 MED ORDER — INSULIN ASPART 100 UNIT/ML ~~LOC~~ SOLN: 0.0000 [IU] | Freq: Three times a day (TID) | SUBCUTANEOUS | Status: DC

## 2017-04-06 NOTE — Progress Notes (Signed)
Pt.'s BP 159/106 on left arm, 161/94 on right arm.  MD made aware.

## 2017-04-06 NOTE — Progress Notes (Signed)
   VASCULAR SURGERY ASSESSMENT & PLAN:   I tentatively planned placement of a left brachiocephalic fistula and tunneled dialysis catheter on Tuesday if the patient is agreeable and this is okay with GI given the findings on upper endoscopy. She would have to be anticoagulated intraoperatively for the procedure.  I spoke with the patient today about this and he seems agreeable although he's not totally sure yet that he is willing to proceed on Tuesday.  SUBJECTIVE:   Feels stronger today.  PHYSICAL EXAM:   Vitals:   04/05/17 2105 04/06/17 0010 04/06/17 0423 04/06/17 0800  BP: (!) 160/101 (!) 165/97 (!) 154/90   Pulse: 94 93    Resp: 16  16   Temp: 99.8 F (37.7 C) 99.3 F (37.4 C) 99.9 F (37.7 C) 99.6 F (37.6 C)  TempSrc: Oral Oral Oral Oral  SpO2: 100% 99% 100%   Weight:      Height:       Palpable left radial pulse. The IV has been moved to the right arm.  LABS:   Lab Results  Component Value Date   WBC 7.7 04/06/2017   HGB 7.4 (L) 04/06/2017   HCT 21.2 (L) 04/06/2017   MCV 79.7 04/06/2017   PLT 103 (L) 04/06/2017   Lab Results  Component Value Date   CREATININE 18.16 (H) 04/06/2017   Lab Results  Component Value Date   INR 1.59 04/03/2017   CBG (last 3)  No results for input(s): GLUCAP in the last 72 hours.  PROBLEM LIST:    Principal Problem:   GI bleed Active Problems:   ESRD (end stage renal disease) (HCC)   Essential hypertension   Uremia   Symptomatic anemia   Hypocalcemia   CURRENT MEDS:   . amLODipine  2.5 mg Oral QHS  . calcitRIOL  0.25 mcg Oral Daily  . darbepoetin (ARANESP) injection - DIALYSIS  200 mcg Intravenous Q Thu-HD  . feeding supplement (NEPRO CARB STEADY)  237 mL Oral TID BM  . multivitamin  1 tablet Oral QHS  . pantoprazole (PROTONIX) IV  40 mg Intravenous Q12H  . sodium bicarbonate  650 mg Oral TID  . triamcinolone cream  1 application Topical BID    Gae Gallop Beeper: [phone] Office:  [phone] 04/06/2017

## 2017-04-06 NOTE — Progress Notes (Signed)
Subjective:  S/p third HD yest- removed 2000, received 2 units PRBC ( think total of 6 units) - hgb up to 8.1 after, down to 7.4 this AM Objective Vital signs in last 24 hours: Vitals:   04/05/17 2105 04/06/17 0010 04/06/17 0423 04/06/17 0800  BP: (!) 160/101 (!) 165/97 (!) 154/90   Pulse: 94 93    Resp: 16  16   Temp: 99.8 F (37.7 C) 99.3 F (37.4 C) 99.9 F (37.7 C) 99.6 F (37.6 C)  TempSrc: Oral Oral Oral Oral  SpO2: 100% 99% 100%   Weight:      Height:       Weight change: -1.3 kg (-2 lb 13.9 oz)  Intake/Output Summary (Last 24 hours) at 04/06/17 1036 Last data filed at 04/05/17 1410  Gross per 24 hour  Intake              315 ml  Output             2000 ml  Net            -1685 ml    Assessment/Plan:  1. ESRD: clearly uremic.   CCM c/s for HD cath, appreciate assistance.  HD late 8/2, second treatment 8/3, third 8/4, fourth Monday (8/6).  Have done vein mapping and consult for permanent access placement as well as tunneled access placement- next week would probably be fine as I anticipate will still be here then and needs to be more stable, tentatively planned for Tuesday appreciate VVS. CLIP in process  2 Acute GI bleed, likely upper: on PPI gtt, GI consulted, EGD with Mallory- Weiss tear- uremic platelets not helping matters.  S/p 2 u pRBCs at [facility].  Another 2 units 8/3,  another 2 units with HD yest as still showing evidence of bleed 3 Hypertension: Normally uncontrolled hypertensive, relatively hypotensive for him in setting of GI bleed, but now back up- will added back norvasc 5- cont to work with volume with HD 4. Anemia of ESRD/ABLA: Started aranesp in addition to pRBCs 5. Metabolic Bone Disease: PTH 1495, phos 7/2 was 10.2- down to 4.9 with just HD.  Will probably need binder when eating.  On daily calcitriol- will need to change to q HD once know schedule 6.  Nutrition: will need RD c/s on low phos diet      Patrick Ibarra A    Labs: Basic Metabolic  Panel:  Recent Labs Lab 04/04/17 2031 04/05/17 1116 04/06/17 0233  NA 139 138 137  K 3.2* 3.4* 3.1*  CL 102 101 99*  CO2 20* 20* 23  GLUCOSE 73 91 92  BUN 103* 116* 80*  CREATININE 22.97* 24.93* 18.16*  CALCIUM 7.4* 7.1* 7.8*  PHOS 5.8* 7.2* 4.9*   Liver Function Tests:  Recent Labs Lab 04/03/17 1243 04/04/17 0045  04/04/17 2031 04/05/17 1116 04/06/17 0233  AST 8*  --   --   --   --   --   ALT 7* 9*  --   --   --   --   ALKPHOS 68  --   --   --   --   --   BILITOT 0.7  --   --   --   --   --   PROT 6.5  --   --   --   --   --   ALBUMIN 3.0*  --   < > 2.8* 2.6* 2.7*  < > = values in this interval not displayed.  Recent Labs Lab 04/03/17 1243  LIPASE 133*   No results for input(s): AMMONIA in the last 168 hours. CBC:  Recent Labs Lab 04/04/17 1849 04/05/17 0149 04/05/17 0747 04/05/17 1933 04/06/17 0233  WBC 5.0 7.6 8.2 7.0 7.7  HGB 7.4* 7.2* 6.8* 8.1* 7.4*  HCT 21.4* 21.1* 19.9* 23.6* 21.2*  MCV 79.9 80.5 80.6 79.2 79.7  PLT 106* 107* 109* 95* 103*   Cardiac Enzymes:  Recent Labs Lab 04/03/17 2324 04/05/17 0149  TROPONINI 0.04* 0.10*   CBG: No results for input(s): GLUCAP in the last 168 hours.  Iron Studies: No results for input(s): IRON, TIBC, TRANSFERRIN, FERRITIN in the last 72 hours. Studies/Results: No results found. Medications: Infusions: . sodium chloride    . sodium chloride    . sodium chloride    . sodium chloride    . sodium chloride    . ferumoxytol      Scheduled Medications: . amLODipine  2.5 mg Oral QHS  . calcitRIOL  0.25 mcg Oral Daily  . darbepoetin (ARANESP) injection - DIALYSIS  200 mcg Intravenous Q Thu-HD  . feeding supplement (NEPRO CARB STEADY)  237 mL Oral TID BM  . multivitamin  1 tablet Oral QHS  . pantoprazole (PROTONIX) IV  40 mg Intravenous Q12H  . sodium bicarbonate  650 mg Oral TID  . triamcinolone cream  1 application Topical BID    have reviewed scheduled and prn medications.  Physical  Exam: General: pt seems dejected - a little more talkative but not making eye contact  Heart: RRR Lungs: poor effort Abdomen: obese, distended, slightly tender Extremities: mild edema Dialysis Access: right vascath placed 8/2    04/06/2017,10:36 AM  LOS: 3 days

## 2017-04-06 NOTE — Progress Notes (Signed)
Patient had a small amount of bloody emesis after eating his lunch.  Will continue to monitor

## 2017-04-06 NOTE — Progress Notes (Signed)
     [facility] Progress Note  Covering for Dr. Benson Norway  Chief Complaint:    GI bleed  Subjective: Awoken from sleep. No complaints. Tolerated clears. No further bleeding  Objective:  Vital signs in last 24 hours: Temp:  [98.2 F (36.8 C)-99.9 F (37.7 C)] 99.6 F (37.6 C) (08/05 0800) Pulse Rate:  [84-101] 93 (08/05 0010) Resp:  [16-24] 16 (08/05 0423) BP: (154-179)/(90-106) 154/90 (08/05 0423) SpO2:  [97 %-100 %] 100 % (08/05 0423) Weight:  [232 lb 9.4 oz (105.5 kg)-236 lb 15.9 oz (107.5 kg)] 232 lb 9.4 oz (105.5 kg) (08/04 1407) Last BM Date: 04/03/17 General:   Alert, well-developed, black male in NAD EENT:  Normal hearing, non icteric sclera, conjunctive pink.  Heart:  Regular rate and rhythm, 1+BLE edema Pulm: Normal respiratory effort, Abdomen:  Soft, nondistended, nontender.  Normal bowel sounds, Neurologic:  Alert and  oriented x4;  grossly normal neurologically. Psych:  Pleasant, cooperative.  Normal mood and affect.   Intake/Output from previous day: 08/04 0701 - 08/05 0700 In: 315 [Blood:315] Out: 2000  Intake/Output this shift: No intake/output data recorded.  Lab Results:  Recent Labs  04/05/17 0747 04/05/17 1933 04/06/17 0233  WBC 8.2 7.0 7.7  HGB 6.8* 8.1* 7.4*  HCT 19.9* 23.6* 21.2*  PLT 109* 95* 103*   BMET  Recent Labs  04/04/17 2031 04/05/17 1116 04/06/17 0233  NA 139 138 137  K 3.2* 3.4* 3.1*  CL 102 101 99*  CO2 20* 20* 23  GLUCOSE 73 91 92  BUN 103* 116* 80*  CREATININE 22.97* 24.93* 18.16*  CALCIUM 7.4* 7.1* 7.8*   LFT  Recent Labs  04/03/17 1243 04/04/17 0045  04/06/17 0233  PROT 6.5  --   --   --   ALBUMIN 3.0*  --   < > 2.7*  AST 8*  --   --   --   ALT 7* 9*  --   --   ALKPHOS 68  --   --   --   BILITOT 0.7  --   --   --   < > = values in this interval not displayed. PT/INR  Recent Labs  04/03/17 1243  LABPROT 19.2*  INR 1.59   Hepatitis Panel  Recent Labs  04/04/17 0045  HEPBSAG  Negative    ASSESSMENT / PLAN:   1. UGI bleed / MWT, s/p EGD with epi injection and hemoclip placement.  -change PPI to PO and continue BID -advance to carb modified diet -GI will sign off, call for questions.  2. ABL. Received another unit of blood yesterday. Hgb up from 6.8 to 7.4.   3. ESRD, recently started dialysis  Principal Problem:   GI bleed Active Problems:   ESRD (end stage renal disease) ([place])   Essential hypertension   Uremia   Symptomatic anemia   Hypocalcemia    LOS: 3 days   Tye Savoy ,NP 04/06/2017, 10:06 AM  Pager number [phone]     Attending physician's note   I have taken an interval history, reviewed the chart and examined the patient. I agree with the Advanced Practitioner's note, impression and recommendations.  No further episodes of vomiting or hematemesis Hgb fluctuating , remains hemodynamically stable Advance diet slowly as tolerated Continue PPI and Zofran prn We will sign off, please call with any questions  K Denzil Magnuson, MD [phone] Mon-Fri 8a-5p [phone] after 5p, weekends, holidays

## 2017-04-06 NOTE — Progress Notes (Addendum)
   Subjective:  Patrick Ibarra reports feeling better this morning. He denies any nausea. No further episodes of hematemesis. Denies any overt bleeding. Reports tolerating clear liquids without issue. No chest pain or shortness of breath. No lightheadedness or dizziness.   Objective:  Vital signs in last 24 hours: Vitals:   04/06/17 0010 04/06/17 0423 04/06/17 0800 04/06/17 1035  BP: (!) 165/97 (!) 154/90  (!) 159/106  Pulse: 93     Resp:  16    Temp: 99.3 F (37.4 C) 99.9 F (37.7 C) 99.6 F (37.6 C)   TempSrc: Oral Oral Oral   SpO2: 99% 100%    Weight:      Height:       GENERAL- alert, cooperative in no acute distress CARDIAC- RRR, aortic flow murmur still present but much improvement, RIJ cath clean and dry RESP- CTAB ABDOMEN- Soft, nontender, bowel sounds present. EXTREMITIES- bilateral edema improving  Assessment/Plan:  Mallory-Weiss Tear: Hgb 8.1 yesterday evening after receiving 2 units in dialysis. Hgb 7.4 this morning. No overt bleeding but suspect still some oozing from the mallory-weiss tear given his uremic plt dysfunction. Will re-check Hgb tomorrow, may need additional transfusion in HD tomorrow. GI okay to advance diet. Changing Protonix IV to PO bid.   ESRD Stage 5 with uremia > now on dialysis Plan for dialysis session tomorrow. Uremia is improving. Advance to renal diet. Tentatively planned for permanent access with vascular on Tuesday. Appears somewhat dejected about having to be on dialysis. Will need to assess for depression.   HTN: SBP in 150-160s. Amlodipine 2.5 mg restarted by nephrology on 8/4 as well as labetalol 200 mg bid today. Given his chronically uncontrolled HTN, GI bleed and dialysis do not believe this needs to be treated aggressively but rather gradually.   Anemia of Chronic Disease: Started on IV iron and Aranesp in addition to his transfusions.   Dispo: Anticipated discharge deferred pending clinical improvement  Maryellen Pile,  MD 04/06/2017, 12:21 PM Pager: [phone]  Internal Medicine Attending  Date: 04/06/2017  Patient name: Patrick Ibarra Medical record number: [id number] Date of birth: April 07, 1981 Age: 36 y.o. Gender: male  I saw and evaluated the patient. I reviewed the resident's note by Dr. Charlynn Grimes and I agree with the resident's findings and plans as documented in his progress note.

## 2017-04-07 LAB — RENAL FUNCTION PANEL
ALBUMIN: 2.6 g/dL — AB (ref 3.5–5.0)
ANION GAP: 13 (ref 5–15)
BUN: 99 mg/dL — AB (ref 6–20)
CALCIUM: 8 mg/dL — AB (ref 8.9–10.3)
CO2: 23 mmol/L (ref 22–32)
CREATININE: 20.25 mg/dL — AB (ref 0.61–1.24)
Chloride: 102 mmol/L (ref 101–111)
GFR calc Af Amer: 3 mL/min — ABNORMAL LOW (ref >60)
GFR calc non Af Amer: 3 mL/min — ABNORMAL LOW (ref >60)
GLUCOSE: 85 mg/dL (ref 65–99)
Phosphorus: 5.5 mg/dL — ABNORMAL HIGH (ref 2.5–4.6)
Potassium: 2.9 mmol/L — ABNORMAL LOW (ref 3.5–5.1)
SODIUM: 138 mmol/L (ref 135–145)

## 2017-04-07 LAB — CBC
HCT: 17.3 % — ABNORMAL LOW (ref 39.0–52.0)
HEMOGLOBIN: 6 g/dL — AB (ref 13.0–17.0)
MCH: 27.4 pg (ref 26.0–34.0)
MCHC: 34.7 g/dL (ref 30.0–36.0)
MCV: 79 fL (ref 78.0–100.0)
Platelets: 104 10*3/uL — ABNORMAL LOW (ref 150–400)
RBC: 2.19 MIL/uL — AB (ref 4.22–5.81)
RDW: 16.9 % — ABNORMAL HIGH (ref 11.5–15.5)
WBC: 7.1 10*3/uL (ref 4.0–10.5)

## 2017-04-07 LAB — GLUCOSE, CAPILLARY
GLUCOSE-CAPILLARY: 89 mg/dL (ref 65–99)
GLUCOSE-CAPILLARY: 73 mg/dL (ref 65–99)
GLUCOSE-CAPILLARY: 103 mg/dL — AB (ref 65–99)
Glucose-Capillary: 133 mg/dL — ABNORMAL HIGH (ref 65–99)

## 2017-04-07 LAB — HEMOGLOBIN AND HEMATOCRIT, BLOOD
HCT: 17.2 % — ABNORMAL LOW (ref 39.0–52.0)
Hemoglobin: 6 g/dL — CL (ref 13.0–17.0)

## 2017-04-07 LAB — PREPARE RBC (CROSSMATCH)

## 2017-04-07 MED ORDER — PANTOPRAZOLE SODIUM 40 MG PO TBEC
40.0000 mg | DELAYED_RELEASE_TABLET | Freq: Two times a day (BID) | ORAL | Status: DC
  Administered 2017-04-07 – 2017-04-11 (×7): 40 mg via ORAL
  Filled 2017-04-07 (×8): qty 1

## 2017-04-07 MED ORDER — SODIUM CHLORIDE 0.9 % IV SOLN: Freq: Once | INTRAVENOUS | Status: DC

## 2017-04-07 NOTE — Progress Notes (Signed)
   VASCULAR SURGERY ASSESSMENT & PLAN:   Hgb 7.4 yesterday. I agree with Dr. Moshe Cipro that it may be best to hold off on AVF and [facility] until we are sure that he is no longer bleeding given that we will need to anticoagulate him in OR. He has a functioning temporary catheter. I will tentatively reschedule him for Thursday.   SUBJECTIVE:   Resting comfortably.  PHYSICAL EXAM:   Vitals:   04/06/17 1503 04/06/17 2030 04/07/17 0004 04/07/17 0431  BP:  (!) 166/101 (!) 169/106 (!) 170/106  Pulse:   98 96  Resp:  12 19 10   Temp: 98.7 F (37.1 C) 99.2 F (37.3 C) 99.3 F (37.4 C) 99.1 F (37.3 C)  TempSrc: Oral Oral Oral Oral  SpO2:  100% 98% 98%  Weight:      Height:       Palpable left radial pulse.  LABS:   Lab Results  Component Value Date   WBC 7.7 04/06/2017   HGB 7.4 (L) 04/06/2017   HCT 21.2 (L) 04/06/2017   MCV 79.7 04/06/2017   PLT 103 (L) 04/06/2017   Lab Results  Component Value Date   CREATININE 18.16 (H) 04/06/2017   Lab Results  Component Value Date   INR 1.59 04/03/2017   CBG (last 3)   Recent Labs  04/06/17 1650 04/06/17 2037 04/07/17 0625  GLUCAP 93 118* 89    PROBLEM LIST:    Principal Problem:   GI bleed Active Problems:   ESRD (end stage renal disease) (HCC)   Essential hypertension   Uremia   Symptomatic anemia   Hypocalcemia   Hematemesis with nausea   CURRENT MEDS:   . amLODipine  2.5 mg Oral QHS  . calcitRIOL  0.5 mcg Oral Daily  . darbepoetin (ARANESP) injection - DIALYSIS  200 mcg Intravenous Q Thu-HD  . feeding supplement (NEPRO CARB STEADY)  237 mL Oral TID BM  . insulin aspart  0-9 Units Subcutaneous TID WC  . labetalol  200 mg Oral BID  . multivitamin  1 tablet Oral QHS  . pantoprazole  40 mg Oral Q0600  . sodium bicarbonate  650 mg Oral TID  . triamcinolone cream  1 application Topical BID    Gae Gallop Beeper: [phone] Office: [phone] 04/07/2017

## 2017-04-07 NOTE — Progress Notes (Signed)
   Subjective:  1x small amount of bloody emesis after lunch yesterday. No other episodes of hematemesis or dark tarry stools. No N/V with dinner last night. Saw Mr. Eveleth in dialysis this morning. He is feeling "alright". Denies chest pain or abdominal pain.  Objective:  Vital signs in last 24 hours: Vitals:   04/06/17 1503 04/06/17 2030 04/07/17 0004 04/07/17 0431  BP:  (!) 166/101 (!) 169/106 (!) 170/106  Pulse:   98 96  Resp:  12 19 10   Temp: 98.7 F (37.1 C) 99.2 F (37.3 C) 99.3 F (37.4 C) 99.1 F (37.3 C)  TempSrc: Oral Oral Oral Oral  SpO2:  100% 98% 98%  Weight:      Height:       GENERAL- well-nourished, AA male lying in bed receiving HD in NAD HEENT- EOMI, anicteric sclera CARDIAC- NR & RR; no m/r/g RESP- CTAB; no wheezes ABDOMEN- Soft, nontender, non-distended bowel sounds present EXTREMITIES- warm and dry; mild bilateral edema  Assessment/Plan:  Mallory-Weiss Tear: 1 episode of hematemesis yesterday. Likely still some oozing from the mallory-weiss tear given his uremic plt dysfunction. Changing Protonix IV to PO bid. - f/u CBC, transfuse if Hb <7 - PO Protonix BID - Renal diet  ESRD Stage 5 with uremia > now on dialysis Still uremic. Appears somewhat dejected about having to be on dialysis. Will need to assess for depression. - Vascular surgery following, appreciate their help --- Tentative plan for AVF & [facility] with Vascular Thursday; will hold off to make sure that he is no longer bleeding given need for anticoagulation - Nephrology following, appreciate recs --- consider starting phoslo with meals when eating --- HD today - Renal diet - calcitriol  HTN: SBP in 160s-170s. Given his chronically uncontrolled HTN, GI bleed and dialysis do not believe this needs to be treated aggressively but rather gradually. - amlodipine 2.5mg  qday - labetalol 200 BID  Anemia of Chronic Disease: Started on IV iron and Aranesp in addition to his transfusions. - f/u  CBC  Dispo: Anticipated discharge deferred pending clinical improvement  Colbert Ewing, MD  Internal Medicine, PGY-1 04/07/2017, 6:50 AM Pager: [phone]

## 2017-04-07 NOTE — Progress Notes (Signed)
Hgb returned 6.0. Will transfuse 2 additional units PRBC today.

## 2017-04-07 NOTE — Progress Notes (Signed)
[facility] Rounding Note  Subjective:   S/p #4 today  No heparin 2.7 liters off Transfused 2 units for Hb of 6 (now total of 8 units of blood this admission)  No hematemesis since yesterday at lunchtime   Objective Vital signs in last 24 hours: Vitals:   04/07/17 1120 04/07/17 1203 04/07/17 1549 04/07/17 1550  BP: (!) 150/82 (!) 146/91 (!) 164/109   Pulse: 98 100 100 96  Resp: 18 18 11 10   Temp: 98.6 F (37 C) 98.5 F (36.9 C) 97.9 F (36.6 C)   TempSrc: Oral Oral Oral   SpO2: 100% 100% 100% 100%  Weight:      Height:       Weight change:   Intake/Output Summary (Last 24 hours) at 04/07/17 1607 Last data filed at 04/07/17 1230  Gross per 24 hour  Intake             1226 ml  Output             1539 ml  Net             -313 ml    Physical Exam: Pt in NAD Still bummed out about having to do HD but does at least make eye contact VS as noted Regular rhythm S1S2 No S3 Lungs grossly clear Abdomen is obese, protuberant, slightly tender Extremities with minimal edema (seen after HD) Dialysis Access: Right IJ vascath placed 8/2 No asterixus    Recent Labs Lab 04/05/17 1116 04/06/17 0233 04/07/17 0730  NA 138 137 138  K 3.4* 3.1* 2.9*  CL 101 99* 102  CO2 20* 23 23  GLUCOSE 91 92 85  BUN 116* 80* 99*  CREATININE 24.93* 18.16* 20.25*  CALCIUM 7.1* 7.8* 8.0*  PHOS 7.2* 4.9* 5.5*    Recent Labs Lab 04/03/17 1243 04/04/17 0045  04/05/17 1116 04/06/17 0233 04/07/17 0730  AST 8*  --   --   --   --   --   ALT 7* 9*  --   --   --   --   ALKPHOS 68  --   --   --   --   --   BILITOT 0.7  --   --   --   --   --   PROT 6.5  --   --   --   --   --   ALBUMIN 3.0*  --   < > 2.6* 2.7* 2.6*  < > = values in this interval not displayed.  Recent Labs Lab 04/03/17 1243  LIPASE 133*    Recent Labs Lab 04/05/17 0149 04/05/17 0747 04/05/17 1933 04/06/17 0233 04/07/17 0830 04/07/17 0900  WBC 7.6 8.2 7.0 7.7 7.1  --   HGB 7.2* 6.8* 8.1* 7.4* 6.0* 6.0*  HCT 21.1*  19.9* 23.6* 21.2* 17.3* 17.2*  MCV 80.5 80.6 79.2 79.7 79.0  --   PLT 107* 109* 95* 103* 104*  --     Recent Labs Lab 04/03/17 2324 04/05/17 0149  TROPONINI 0.04* 0.10*    Recent Labs Lab 04/06/17 1650 04/06/17 2037 04/07/17 0625 04/07/17 1159  GLUCAP 93 118* 89 73   Lab Results  Component Value Date   PTH 1,495 (H) 03/03/2017   CALCIUM 8.0 (L) 04/07/2017   PHOS 5.5 (H) 04/07/2017    Scheduled Medications: . amLODipine  2.5 mg Oral QHS  . calcitRIOL  0.5 mcg Oral Daily  . darbepoetin (ARANESP) injection - DIALYSIS  200 mcg Intravenous Q Thu-HD  .  feeding supplement (NEPRO CARB STEADY)  237 mL Oral TID BM  . insulin aspart  0-9 Units Subcutaneous TID WC  . labetalol  200 mg Oral BID  . multivitamin  1 tablet Oral QHS  . pantoprazole  40 mg Oral BID AC  . sodium bicarbonate  650 mg Oral TID  . triamcinolone cream  1 application Topical BID    Assessment/Plan:  1. ESRD: HD 8/2, 8/3, 8/4, and today 8/6. Numbers slow improvement. Plan HD again tomorrow. Dr. Scot Dock has seen for access placement and is tentatively scheduled for Thursday of this week assuming stability of Hb. CLIP in process - no unit assignment as of yet 2. Metabolic acidosis - dialysis Will control. Stop oral sodium bicarbonate.   3. Acute UGI bleed 2/2 M-W tear. Uremic platelets not helping matters.  s/p total of 8 units of blood since presentation (2 today with HD 2/2 Hb of 6). 4. Hypertension - currently amlodipine and labetolol. Cont to work with volume with HD 5. Anemia of ESRD/ABLA: Started aranesp 200 QThursday  in addition to PRBCs. Feraheme was ordered, I don't hink was ever given. Rx ferrlecit 250 IV QD X 4 doses. 6. CKD-MBD - PTH 1495, phos 7/2 was 10.2- down to 4.9 with just HD. Will need binder when eating.  On daily calcitriol 0.5 mcg - will need to change to q HD once know schedule 7. Nutrition: will need RD c/s on low phos diet  Patrick Maes, MD [place]  Pager 04/07/2017, 4:19 PM

## 2017-04-08 LAB — RENAL FUNCTION PANEL
ALBUMIN: 2.8 g/dL — AB (ref 3.5–5.0)
Anion gap: 11 (ref 5–15)
BUN: 37 mg/dL — ABNORMAL HIGH (ref 6–20)
CALCIUM: 8.2 mg/dL — AB (ref 8.9–10.3)
CO2: 25 mmol/L (ref 22–32)
CREATININE: 11.22 mg/dL — AB (ref 0.61–1.24)
Chloride: 99 mmol/L — ABNORMAL LOW (ref 101–111)
GFR, EST AFRICAN AMERICAN: 6 mL/min — AB (ref >60)
GFR, EST NON AFRICAN AMERICAN: 5 mL/min — AB (ref >60)
Glucose, Bld: 90 mg/dL (ref 65–99)
PHOSPHORUS: 4.3 mg/dL (ref 2.5–4.6)
Potassium: 3.4 mmol/L — ABNORMAL LOW (ref 3.5–5.1)
Sodium: 135 mmol/L (ref 135–145)

## 2017-04-08 LAB — TYPE AND SCREEN
ABO/RH(D): O POS
Antibody Screen: NEGATIVE
UNIT DIVISION: 0
Unit division: 0

## 2017-04-08 LAB — CBC
HCT: 23.2 % — ABNORMAL LOW (ref 39.0–52.0)
Hemoglobin: 7.7 g/dL — ABNORMAL LOW (ref 13.0–17.0)
MCH: 27 pg (ref 26.0–34.0)
MCHC: 33.2 g/dL (ref 30.0–36.0)
MCV: 81.4 fL (ref 78.0–100.0)
PLATELETS: 116 10*3/uL — AB (ref 150–400)
RBC: 2.85 MIL/uL — AB (ref 4.22–5.81)
RDW: 16.3 % — ABNORMAL HIGH (ref 11.5–15.5)
WBC: 9.2 10*3/uL (ref 4.0–10.5)

## 2017-04-08 LAB — BPAM RBC
Blood Product Expiration Date: 201808062359
Blood Product Expiration Date: 201808072359
ISSUE DATE / TIME: 201808060959
ISSUE DATE / TIME: 201808060959
UNIT TYPE AND RH: 9500
Unit Type and Rh: 9500

## 2017-04-08 LAB — MRSA PCR SCREENING: MRSA BY PCR: NEGATIVE

## 2017-04-08 LAB — GLUCOSE, CAPILLARY
GLUCOSE-CAPILLARY: 96 mg/dL (ref 65–99)
Glucose-Capillary: 107 mg/dL — ABNORMAL HIGH (ref 65–99)
Glucose-Capillary: 116 mg/dL — ABNORMAL HIGH (ref 65–99)

## 2017-04-08 MED ORDER — SEVELAMER CARBONATE 800 MG PO TABS
800.0000 mg | ORAL_TABLET | Freq: Three times a day (TID) | ORAL | Status: DC
Start: 2017-04-08 — End: 2017-04-09
  Administered 2017-04-08 – 2017-04-09 (×2): 800 mg via ORAL
  Filled 2017-04-08 (×2): qty 1

## 2017-04-08 MED ORDER — SODIUM CHLORIDE 0.9 % IV SOLN
250.0000 mg | Freq: Every day | INTRAVENOUS | Status: DC
  Administered 2017-04-08 – 2017-04-10 (×3): 250 mg via INTRAVENOUS
  Filled 2017-04-08 (×6): qty 20

## 2017-04-08 NOTE — Anesthesia Postprocedure Evaluation (Signed)
Anesthesia Post Note  Patient: Patrick Ibarra  Procedure(s) Performed: Procedure(s) (LRB): ESOPHAGOGASTRODUODENOSCOPY (EGD) WITH PROPOFOL (N/A)     Patient location during evaluation: Endoscopy Anesthesia Type: MAC Level of consciousness: awake Pain management: pain level controlled Vital Signs Assessment: post-procedure vital signs reviewed and stable Respiratory status: spontaneous breathing, nonlabored ventilation, respiratory function stable and patient connected to nasal cannula oxygen Cardiovascular status: stable Anesthetic complications: no    Last Vitals:  Vitals:   04/08/17 1530 04/08/17 1549  BP: (!) 151/98 (!) 158/96  Pulse: 96 91  Resp: (!) 21 18  Temp:  37 C    Last Pain:  Vitals:   04/08/17 1549  TempSrc: Oral  PainSc: 0-No pain                 Lucresia Simic

## 2017-04-08 NOTE — Procedures (Signed)
I have personally attended this patient's dialysis session.   HD#5 today No heparin Hb 7.7 3K bath R IJ temp cath running well  Jamal Maes, MD [facility] [phone] Pager 04/08/2017, 1:13 PM

## 2017-04-08 NOTE — Progress Notes (Signed)
Subjective:  No acute events overnight. No hematemesis. Stools still dark. Tolerating food without nausea. Plan for HD again today. Transfused 2 units pRBC yesterday, Hb 7.7 now. Had a discussion with Mr. Majette yesterday afternoon regarding his future with dialysis. He is still adjusting to the big change that dialysis will be, but he seems to be slowly coming around. He and his family are asking many questions, showing that he is thinking about it carefully. He also is interested in learning more about the process of transplantation. On my discussion, he was open and willing to engage in conversation. Beck Depression Inventory - 22, consistent with moderate depression.  Objective:  Vital signs in last 24 hours: Vitals:   04/08/17 1400 04/08/17 1430 04/08/17 1500 04/08/17 1530  BP: (!) 170/105 (!) 159/106 (!) 155/102 (!) 151/98  Pulse: 94 95 92 96  Resp: 16 16 19  (!) 21  Temp:      TempSrc:      SpO2:      Weight:      Height:       GENERAL- well-nourished, AA male sitting up in chair in NAD HEENT- EOMI, anicteric sclera CARDIAC- NR & RR; no murmurs RESP- CTAB; no wheezes ABDOMEN- Soft, nontender, non-distended; normal bowel sounds EXTREMITIES- warm and dry; mild edema  CBC    Component Value Date/Time   WBC 9.2 04/08/2017 0550   RBC 2.85 (L) 04/08/2017 0550   HGB 7.7 (L) 04/08/2017 0550   HGB 7.3 (L) 03/18/2017 0942   HCT 23.2 (L) 04/08/2017 0550   HCT 21.8 (L) 03/18/2017 0942   PLT 116 (L) 04/08/2017 0550   PLT 143 (L) 03/18/2017 0942   MCV 81.4 04/08/2017 0550   MCV 81 03/18/2017 0942   MCH 27.0 04/08/2017 0550   MCHC 33.2 04/08/2017 0550   RDW 16.3 (H) 04/08/2017 0550   RDW 17.0 (H) 03/18/2017 0942   LYMPHSABS 1.0 12/21/2013 0510   MONOABS 0.1 12/21/2013 0510   EOSABS 0.0 12/21/2013 0510   BASOSABS 0.0 12/21/2013 0510   BMET    Component Value Date/Time   NA 135 04/08/2017 0550   NA 134 03/18/2017 0942   K 3.4 (L) 04/08/2017 0550   CL 99 (L) 04/08/2017  0550   CO2 25 04/08/2017 0550   GLUCOSE 90 04/08/2017 0550   BUN 37 (H) 04/08/2017 0550   BUN 135 (HH) 03/18/2017 0942   CREATININE 11.22 (H) 04/08/2017 0550   CREATININE 3.16 (H) 01/05/2014 1459   CALCIUM 8.2 (L) 04/08/2017 0550   CALCIUM 7.4 (L) 03/01/2017 0752   GFRNONAA 5 (L) 04/08/2017 0550   GFRAA 6 (L) 04/08/2017 0550   Assessment/Plan:  Mallory-Weiss Tear: No episodes of hematemesis. Stools still dark. Likely still some oozing from the mallory-weiss tear given his uremic plt dysfunction. Hb stable today at 7.7. - daily CBCs, transfuse if Hb <7 - PO Protonix BID - Renal diet  ESRD Stage 5 with uremia > now on dialysis Still uremic. Patient continues to think about his future and dialysis needs. Seems more open to conversation and engaged in his care. - Vascular surgery following, appreciate their help --- Tentative plan for AVF & [facility] with Vascular Thursday; will hold off to make sure that he is no longer bleeding given need for anticoagulation - Nephrology following, appreciate recs --- HD today - Renal diet  HTN: SBP in 150s-160s. Given his chronically uncontrolled HTN, GI bleed and dialysis do not believe this needs to be treated aggressively but rather gradually. -  amlodipine 2.5mg  qday - labetalol 200 BID  Anemia of Chronic Disease: Aranesp in addition to his transfusions. - Ferrlecit 250mg  daily x 4d with hemodialysis - f/u CBC, transfuse if Hb<7  Situational Depression: Denies SI or HI. Had a conversation with patient and his significant other regarding his thoughts about the future. Beck Depression Inventory at 28, a score that puts him at moderate depression. However, I believe that this is an acute response to his situation and that he is still coming to terms with everything that has happened to him. I am hesitant to put him on medication for now. He seems more engaged in his care now than he was even a few days ago and we will continue to monitor. -  Continue to monitor  Dispo: Anticipated discharge deferred pending clinical improvement  Colbert Ewing, MD  Internal Medicine, PGY-1 04/08/2017, 3:48 PM Pager: [phone]

## 2017-04-08 NOTE — Progress Notes (Signed)
[facility] Rounding Note  Subjective:    HD#5  today  Doing well Still using R IJ temp cath No hematemesis for 2 days Stools still dark   Objective Vital signs in last 24 hours: Vitals:   04/08/17 1155 04/08/17 1200 04/08/17 1215 04/08/17 1230  BP: (!) 156/104 (!) 162/110 (!) 157/105 (!) 157/105  Pulse: 66 91 90 88  Resp: 20 13 10 11   Temp:      TempSrc:      SpO2:      Weight:      Height:       Weight change:   Intake/Output Summary (Last 24 hours) at 04/08/17 1304 Last data filed at 04/08/17 1115  Gross per 24 hour  Intake              720 ml  Output                0 ml  Net              720 ml    Physical Exam: Pt in NAD Seen in the HD unit VS as noted Regular rhythm S1S2 No S3 Lungs clear Abdomen is obese, protuberant, slightly tender No LE edema Dialysis Access: Right IJ vascath placed 8/2 - in use No asterixus    Recent Labs Lab 04/06/17 0233 04/07/17 0730 04/08/17 0550  NA 137 138 135  K 3.1* 2.9* 3.4*  CL 99* 102 99*  CO2 23 23 25   GLUCOSE 92 85 90  BUN 80* 99* 37*  CREATININE 18.16* 20.25* 11.22*  CALCIUM 7.8* 8.0* 8.2*  PHOS 4.9* 5.5* 4.3    Recent Labs Lab 04/03/17 1243 04/04/17 0045  04/06/17 0233 04/07/17 0730 04/08/17 0550  AST 8*  --   --   --   --   --   ALT 7* 9*  --   --   --   --   ALKPHOS 68  --   --   --   --   --   BILITOT 0.7  --   --   --   --   --   PROT 6.5  --   --   --   --   --   ALBUMIN 3.0*  --   < > 2.7* 2.6* 2.8*  < > = values in this interval not displayed.  Recent Labs Lab 04/03/17 1243  LIPASE 133*    Recent Labs Lab 04/05/17 0747 04/05/17 1933 04/06/17 0233 04/07/17 0830 04/07/17 0900 04/08/17 0550  WBC 8.2 7.0 7.7 7.1  --  9.2  HGB 6.8* 8.1* 7.4* 6.0* 6.0* 7.7*  HCT 19.9* 23.6* 21.2* 17.3* 17.2* 23.2*  MCV 80.6 79.2 79.7 79.0  --  81.4  PLT 109* 95* 103* 104*  --  116*    Recent Labs Lab 04/03/17 2324 04/05/17 0149  TROPONINI 0.04* 0.10*    Recent Labs Lab 04/07/17 0625  04/07/17 1159 04/07/17 1547 04/07/17 2130 04/08/17 0739  GLUCAP 89 73 103* 133* 96   Lab Results  Component Value Date   PTH 1,495 (H) 03/03/2017   CALCIUM 8.2 (L) 04/08/2017   PHOS 4.3 04/08/2017    Scheduled Medications: . amLODipine  2.5 mg Oral QHS  . calcitRIOL  0.5 mcg Oral Daily  . darbepoetin (ARANESP) injection - DIALYSIS  200 mcg Intravenous Q Thu-HD  . feeding supplement (NEPRO CARB STEADY)  237 mL Oral TID BM  . insulin aspart  0-9 Units Subcutaneous TID WC  .  labetalol  200 mg Oral BID  . multivitamin  1 tablet Oral QHS  . pantoprazole  40 mg Oral BID AC  . triamcinolone cream  1 application Topical BID    Assessment/Plan:  1. ESRD: HD 8/2, 8/3, 8/4, 8/6, and today 8/7. Numbers continue to show slow improvement.  Dr. Scot Dock has seen for access placement and is tentatively scheduled for Thursday of this week assuming stability of Hb. CLIP in process - no unit assignment as of yet 2. Metabolic acidosis - dialysis controlling   3. Acute UGI bleed 2/2 M-W tear. Uremic platelets not helping matters.  s/p total of 8 units of blood since presentation (2 with HD 2/2 for Hb of 6 - 7.7 today). 4. Hypertension - currently amlodipine and labetolol.  5. Anemia of ESRD/ABLA: Started aranesp 200 QThursday  in addition to PRBCs. Feraheme was ordered, I don't think was ever given. Rx ferrlecit 250 IV QD X 4 doses. 6. CKD-MBD - PTH 1495, phos 7/2 was 10.2- down to 4.9 with just HD. Add binder (Renvela).  On daily calcitriol 0.5 mcg - will need to change to q HD once know schedule 7. Nutrition: will need RD c/s on low phos diet  Jamal Maes, MD [facility] [phone] Pager 04/08/2017, 1:04 PM

## 2017-04-08 NOTE — Progress Notes (Addendum)
Results for DAVIEON, STOCKHAM (MRN [id number]) as of 04/07/17 0900  Ref. Range 04/07/2017 09:00  Hemoglobin Latest Ref Range: 13.0 - 17.0 g/dL 6.0 (LL)     Nephrologist paged and received orders to transfuse 2 units prbc.

## 2017-04-09 LAB — CBC
HCT: 23 % — ABNORMAL LOW (ref 39.0–52.0)
Hemoglobin: 7.7 g/dL — ABNORMAL LOW (ref 13.0–17.0)
MCH: 28.2 pg (ref 26.0–34.0)
MCHC: 33.5 g/dL (ref 30.0–36.0)
MCV: 84.2 fL (ref 78.0–100.0)
PLATELETS: 146 10*3/uL — AB (ref 150–400)
RBC: 2.73 MIL/uL — ABNORMAL LOW (ref 4.22–5.81)
RDW: 17.3 % — AB (ref 11.5–15.5)
WBC: 8.2 10*3/uL (ref 4.0–10.5)

## 2017-04-09 LAB — RENAL FUNCTION PANEL
ALBUMIN: 2.9 g/dL — AB (ref 3.5–5.0)
Anion gap: 12 (ref 5–15)
BUN: 16 mg/dL (ref 6–20)
CO2: 23 mmol/L (ref 22–32)
CREATININE: 7.12 mg/dL — AB (ref 0.61–1.24)
Calcium: 8.7 mg/dL — ABNORMAL LOW (ref 8.9–10.3)
Chloride: 102 mmol/L (ref 101–111)
GFR calc Af Amer: 11 mL/min — ABNORMAL LOW (ref >60)
GFR, EST NON AFRICAN AMERICAN: 9 mL/min — AB (ref >60)
Glucose, Bld: 89 mg/dL (ref 65–99)
PHOSPHORUS: 3.2 mg/dL (ref 2.5–4.6)
Potassium: 3.9 mmol/L (ref 3.5–5.1)
Sodium: 137 mmol/L (ref 135–145)

## 2017-04-09 LAB — GLUCOSE, CAPILLARY
GLUCOSE-CAPILLARY: 107 mg/dL — AB (ref 65–99)
GLUCOSE-CAPILLARY: 116 mg/dL — AB (ref 65–99)
GLUCOSE-CAPILLARY: 104 mg/dL — AB (ref 65–99)
Glucose-Capillary: 95 mg/dL (ref 65–99)

## 2017-04-09 MED ORDER — CALCITRIOL 0.5 MCG PO CAPS
1.0000 ug | ORAL_CAPSULE | ORAL | Status: DC
  Administered 2017-04-11: 1 ug via ORAL

## 2017-04-09 MED ORDER — SEVELAMER CARBONATE 800 MG PO TABS
400.0000 mg | ORAL_TABLET | Freq: Three times a day (TID) | ORAL | Status: DC
  Administered 2017-04-09 – 2017-04-11 (×4): 400 mg via ORAL
  Filled 2017-04-09 (×4): qty 1

## 2017-04-09 MED ORDER — CEFAZOLIN SODIUM-DEXTROSE 1-4 GM/50ML-% IV SOLN
1.0000 g | INTRAVENOUS | Status: DC
  Filled 2017-04-09: qty 50

## 2017-04-09 NOTE — Clinical Social Work Note (Signed)
SCAT application faxed to [employer] today. Mr. Breeden advised that SCAT application faxed and he was provided with original application to keep.  Breionna Punt Givens, MSW, LCSW Licensed Clinical Social Worker [place] [phone]

## 2017-04-09 NOTE — Progress Notes (Signed)
VASCULAR SURGERY:  Please advise when it would be safe to proceed with placement of a left arm AV fistula and tunneled dialysis catheter. He has required multiple transfusions and has now received a total of 8 units of blood. Given that he has a temporary dialysis catheter, and that we would need to heparinize at the time of surgery, it may be best to hold off on surgery until next week.  Deitra Mayo, MD, [place] [phone] Office: [phone]

## 2017-04-09 NOTE — Discharge Summary (Signed)
Name: Patrick Ibarra MRN: [id number] DOB: Oct 21, 1980 36 y.o. PCP: Ladell Pier, MD  Date of Admission: 04/03/2017 12:18 PM Date of Discharge: 04/11/2017 Attending Physician: Oval Linsey, MD  Discharge Diagnosis: 1. ESRD, stage 5 with uremia 2. Mallory-Weiss tear  Principal Problem:   GI bleed Active Problems:   ESRD (end stage renal disease) ([place])   Essential hypertension   Uremia   Symptomatic anemia   Hypocalcemia   Hematemesis with nausea  Discharge Medications: Allergies as of 04/11/2017   No Known Allergies     Medication List    STOP taking these medications   calcitRIOL 0.25 MCG capsule Commonly known as:  ROCALTROL   furosemide 80 MG tablet Commonly known as:  LASIX     TAKE these medications   amLODipine 5 MG tablet Commonly known as:  NORVASC Take 0.5 tablets (2.5 mg total) by mouth daily.   calcium carbonate 500 MG chewable tablet Commonly known as:  TUMS 2 tabs PO with each meal and at bedtime.   ferrous sulfate 325 (65 FE) MG tablet Commonly known as:  FERROUSUL Take 1 tablet (325 mg total) by mouth daily with breakfast.   glucose blood test strip Commonly known as:  TRUE METRIX BLOOD GLUCOSE TEST Use as instructed   labetalol 300 MG tablet Commonly known as:  NORMODYNE Take 1 tablet (300 mg total) by mouth 2 (two) times daily.   sevelamer carbonate 800 MG tablet Commonly known as:  RENVELA Take 0.5 tablets (400 mg total) by mouth 3 (three) times daily with meals.   sodium bicarbonate 650 MG tablet Take 1 tablet (650 mg total) by mouth 3 (three) times daily.   triamcinolone cream 0.1 % Commonly known as:  KENALOG Apply 1 application topically 2 (two) times daily.   TRUE METRIX METER w/Device Kit Use as directed   TRUEPLUS LANCETS 28G Misc Use as directed       Disposition and follow-up:   Patrick Ibarra was discharged from [facility] in Good condition.  At the hospital follow up visit please  address:  1.  - Any more episodes of hematemesis or dark tarry stools? - Is he making it to dialysis at [facility] MWF? - How is he handling dialysis? What are his thoughts on his future care? - How is his blood pressure? Consider adjusting medications if needed. - Check fistula site and catheter site.  2. Nephrology recommendations - L arm limited to lifting 1 pound weight - Do not get TDC or AVF site wet - At least 6 weeks until AVF mature - Estimated dry weight: 222 lb  2.  Labs / imaging needed at time of follow-up: None  3.  Pending labs/ test needing follow-up: None  Follow-up Appointments: Follow-up Information    Conrad Brooklawn, MD Follow up in 6 week(s).   Specialties:  Vascular Surgery, Cardiology Why:  Our office will call you to arrange an appointment (sent) Contact information: [place] [address] [phone]        Ladell Pier, MD Follow up in 10 day(s).   Specialty:  Internal Medicine Contact information: [place] [address] [phone]          Hospital Course by problem list: Principal Problem:   GI bleed Active Problems:   ESRD (end stage renal disease) ([place])   Essential hypertension   Uremia   Symptomatic anemia   Hypocalcemia   Hematemesis with nausea  1. ESRD,  stage 5 with uremia Patient admitted on 8/2 with hematemesis and maroon-colored stool. Of note, he was diagnosed with stage V CKD 1 month ago but refused initiation of dialysis at that time. On this admission, he agreed to undergo hemodialysis and had 5 hemodialysis sessions (8/2, 8/3, 8/4, 8/6, and 8/7) with improvement in his N/V, decreased appetite, and bleeding likely related to his uremia. He has been more engaged and open to conversation throughout the hospital stay regarding continuing dialysis, his future care, and possible transplant. After 48 hours of stability from his Mallory-Weiss tears, AVF and TDC done by Vascular Surgery on 8/8. Doing  well after procedure. Patient instructed on catheter care and limits on left arm. Nephrology set him up at [facility] on MWF schedule for outpatient HD. Will get 54mg calcitriol on MWF with HD and Aranesp at HD (no script given).  2. Mallory-Weiss tear EGD with 2 large Mallory-Weiss tears, one of which was bleeding. This required 8 hemoclips. Likely secondary to retching from uremia secondary to ESRD. Last episode of hematemesis 8/5. Last episode of dark tarry stools on 8/8. He received 8 total units of pRBC transfusions and Hb stabilized.  3. HTN Systolic blood pressure in the 140s-160s in the hospital. Given his chronically uncontrolled HTN, GI bleed, and dialysis, we were conservative in treating his blood pressures. Discharged on amlodipine 2.529mqday and labetalol 20060mID.  4. Anemia of chronic disease Received 8 transfusions total in the hospital. Also received Ferrlecit and Aranesp. Hb stable.  5. Situational Depression Patient denied SI or HI. Spoke with patient and his significant other regarding his thoughts about the future. Beck Depression Inventory at 22,18 score that puts him at moderate depression. However, I believe that this is an acute response to his situation and that he is still coming to terms with everything that has happened to him. We did not start him on medication for now, given that he seemed to become more and more engaged in his care throughout his hospital stay. Continue to monitor and consider starting antidepressive if needed.  Discharge Vitals:   BP (!) 160/96 (BP Location: Right Arm)   Pulse 95   Temp 98.1 F (36.7 C) (Oral)   Resp 18   Ht _0  (1.854 m)   Wt 222 lb 0.1 oz (100.7 kg)   SpO2 98%   BMI 29.29 kg/m   Pertinent Labs, Studies, and Procedures:  CBC Latest Ref Rng & Units 04/11/2017 04/10/2017 04/09/2017  WBC 4.0 - 10.5 K/uL 8.5 9.5 8.2  Hemoglobin 13.0 - 17.0 g/dL 7.4(L) 7.1(L) 7.7(L)  Hematocrit 39.0 - 52.0 % 22.9(L) 21.5(L) 23.0(L)  Platelets 150 -  400 K/uL 194 168 146(L)   BMP Latest Ref Rng & Units 04/11/2017 04/10/2017 04/09/2017  Glucose 65 - 99 mg/dL 115(H) 89 89  BUN 6 - 20 mg/dL 38(H) 24(H) 16  Creatinine 0.61 - 1.24 mg/dL 12.17(H) 10.04(H) 7.12(H)  BUN/Creat Ratio 9 - 20 - - -  Sodium 135 - 145 mmol/L 136 138 137  Potassium 3.5 - 5.1 mmol/L 4.6 3.9 3.9  Chloride 101 - 111 mmol/L 101 100(L) 102  CO2 22 - 32 mmol/L _1 Calcium 8.9 - 10.3 mg/dL 8.2(L) 8.5(L) 8.7(L)   Phos 5.5 -> 5.8 -> 7.2 -> 4.9 -> 5.5 -> 4.3 -> 3.2 -> 5.2  Discharge Instructions: Discharge Instructions    Call MD for:  difficulty breathing, headache or visual disturbances    Complete by:  As directed  Call MD for:  extreme fatigue    Complete by:  As directed    Call MD for:  persistant dizziness or light-headedness    Complete by:  As directed    Call MD for:  persistant nausea and vomiting    Complete by:  As directed    Call MD for:  redness, tenderness, or signs of infection (pain, swelling, redness, odor or green/yellow discharge around incision site)    Complete by:  As directed    Call MD for:  severe uncontrolled pain    Complete by:  As directed    Call MD for:  temperature >100.4    Complete by:  As directed    Diet - low sodium heart healthy    Complete by:  As directed    Discharge instructions    Complete by:  As directed    Please limit your left arm to lifting no more than 1 pound of weight. Please do not get your dialysis catheter site or fistula site wet. The fistula will take at least 6 weeks to mature. Your estimated dry weight is 222 pounds. You are scheduled for dialysis on Monday, Wednesday, Friday at the outpatient dialysis center.   Increase activity slowly    Complete by:  As directed      Signed: Colbert Ewing, MD  Internal Medicine, PGY-1 04/11/2017, 12:59 PM   P [phone]

## 2017-04-09 NOTE — Progress Notes (Signed)
Subjective:  No acute events overnight. Note by RN in the chart dated 8/7 at 11:44pm is regarding an event 36 hours prior. He did not receive 2 units of pRBC last night (last transfusion was 8/6). His Hgb was 7.7 yesterday and is 7.7 again this morning. HD yesterday. Ferrlecit x4 doses started yesterday, per Nephrology. Stools still dark and tarry. No hematemesis (last episode 8/5). Patient is tolerating food well with no nausea. Patient and significant other are open to conversation with Vascular Surgery about AVF and [facility] placement.  Patient does complain of some right heel pain that he notes has been going on for a while. He denies calf cramps. Says the first few steps in the morning hurt the most. Notes he has flat feet.  Objective:  Vital signs in last 24 hours: Vitals:   04/08/17 1653 04/08/17 2040 04/09/17 0500 04/09/17 1000  BP: (!) 145/100 (!) 148/98 (!) 145/85 (!) 150/74  Pulse: 95 96 (!) 101 100  Resp: 18 18 20 20   Temp: 98.4 F (36.9 C) 98.8 F (37.1 C) 98.9 F (37.2 C) 98.8 F (37.1 C)  TempSrc: Oral Oral Oral Oral  SpO2: 100% 100% 100% 100%  Weight:  221 lb 5.5 oz (100.4 kg)    Height:       GENERAL- well-nourished, AA male sitting up in chair comfortably in NAD HEENT- EOMI, anicteric sclera CARDIAC- NR & RR; no murmurs RESP- CTAB; no wheezes ABDOMEN- Soft, nontender, non-distended; normal bowel sounds EXTREMITIES- minimal B/L edema; right heel is nontender, warm, DP pulse intact.  CBC    Component Value Date/Time   WBC 8.2 04/09/2017 0448   RBC 2.73 (L) 04/09/2017 0448   HGB 7.7 (L) 04/09/2017 0448   HGB 7.3 (L) 03/18/2017 0942   HCT 23.0 (L) 04/09/2017 0448   HCT 21.8 (L) 03/18/2017 0942   PLT 146 (L) 04/09/2017 0448   PLT 143 (L) 03/18/2017 0942   MCV 84.2 04/09/2017 0448   MCV 81 03/18/2017 0942   MCH 28.2 04/09/2017 0448   MCHC 33.5 04/09/2017 0448   RDW 17.3 (H) 04/09/2017 0448   RDW 17.0 (H) 03/18/2017 0942   LYMPHSABS 1.0 12/21/2013 0510   MONOABS 0.1 12/21/2013 0510   EOSABS 0.0 12/21/2013 0510   BASOSABS 0.0 12/21/2013 0510   BMET    Component Value Date/Time   NA 137 04/09/2017 0448   NA 134 03/18/2017 0942   K 3.9 04/09/2017 0448   CL 102 04/09/2017 0448   CO2 23 04/09/2017 0448   GLUCOSE 89 04/09/2017 0448   BUN 16 04/09/2017 0448   BUN 135 (HH) 03/18/2017 0942   CREATININE 7.12 (H) 04/09/2017 0448   CREATININE 3.16 (H) 01/05/2014 1459   CALCIUM 8.7 (L) 04/09/2017 0448   CALCIUM 7.4 (L) 03/01/2017 0752   GFRNONAA 9 (L) 04/09/2017 0448   GFRAA 11 (L) 04/09/2017 0448   Assessment/Plan:  Mallory-Weiss Tear: No episodes of hematemesis (last episode 8/5). Stools still dark and tarry. Hb stable today at 7.7 (7.7 yesterday). Last transfusion 8/6. (Has received 8 units total). Mallory-Weiss tear likely healing. - daily CBCs, transfuse if Hb <7 - PO Protonix BID - Renal diet  ESRD Stage 5 with uremia > now on dialysis Uremic platelet dysfunction improving with aggressive hemodialysis. Patient continues to think about his future and dialysis needs. More open to conversation and engaged in his care. - Vascular surgery following, appreciate their help --- If Hb stable tomorrow, OR tomorrow for AVF & [facility] - Nephrology following,  appreciate recs --- setting up outpatient HD - Renal diet - BMET tomorrow AM  HTN: SBP in 140s-150s. Given his chronically uncontrolled HTN, GI bleed and dialysis do not believe this needs to be treated aggressively but rather gradually. - amlodipine 2.5mg  qday - labetalol 200 BID  Anemia of Chronic Disease: Ferrlecit x4 doses and Aranesp. Now s/p 8 pRBC transfusions total. (Last transfusion 8/6) - f/u CBC, transfuse if Hb<7  Right heel pain, early plantar fasciitis vs pain with flat feet Pain is worst during the first few steps of the morning, suggesting plantar fasciitis. People with flat feet also more at risk of plantar fasciitis - Encouraged patient to do foot stretches and roll  his foot on a cylindrical object before standing up in the morning - Continue to monitor  Situational Depression: Denies SI or HI. Beck Depression Inventory at 36, a score that puts him at moderate depression. However, I believe that this is an acute response to his situation. He has continued to be more engaged with his care and open to conversation. I will not put him on medication for now and will continue to monitor. - Continue to monitor  Dispo: Anticipated discharge deferred pending clinical improvement  Colbert Ewing, MD  Internal Medicine, PGY-1 04/09/2017, 10:26 AM Pager: [phone]

## 2017-04-09 NOTE — Progress Notes (Signed)
[facility] Rounding Note  Subjective:    HD#5 8/7 For access placement (hopefully) tomorrow Has outpt HD spot at [facility] on MWF schedule   Objective Vital signs in last 24 hours: Vitals:   04/08/17 1653 04/08/17 2040 04/09/17 0500 04/09/17 1000  BP: (!) 145/100 (!) 148/98 (!) 145/85 (!) 150/74  Pulse: 95 96 (!) 101 100  Resp: 18 18 20 20   Temp: 98.4 F (36.9 C) 98.8 F (37.1 C) 98.9 F (37.2 C) 98.8 F (37.1 C)  TempSrc: Oral Oral Oral Oral  SpO2: 100% 100% 100% 100%  Weight:  100.4 kg (221 lb 5.5 oz)    Height:       Weight change: -1.8 kg (-3 lb 15.5 oz)  Intake/Output Summary (Last 24 hours) at 04/09/17 1205 Last data filed at 04/09/17 1002  Gross per 24 hour  Intake              600 ml  Output             2000 ml  Net            -1400 ml    Physical Exam: Pt in NAD VS as noted Regular rhythm S1S2 No S3 Lungs clear Abdomen is obese, protuberant, slightly tender No LE edema Dialysis Access: Right IJ vascath placed 8/2 No asterixus    Recent Labs Lab 04/07/17 0730 04/08/17 0550 04/09/17 0448  NA 138 135 137  K 2.9* 3.4* 3.9  CL 102 99* 102  CO2 23 25 23   GLUCOSE 85 90 89  BUN 99* 37* 16  CREATININE 20.25* 11.22* 7.12*  CALCIUM 8.0* 8.2* 8.7*  PHOS 5.5* 4.3 3.2    Recent Labs Lab 04/03/17 1243 04/04/17 0045  04/07/17 0730 04/08/17 0550 04/09/17 0448  AST 8*  --   --   --   --   --   ALT 7* 9*  --   --   --   --   ALKPHOS 68  --   --   --   --   --   BILITOT 0.7  --   --   --   --   --   PROT 6.5  --   --   --   --   --   ALBUMIN 3.0*  --   < > 2.6* 2.8* 2.9*  < > = values in this interval not displayed.  Recent Labs Lab 04/03/17 1243  LIPASE 133*    Recent Labs Lab 04/05/17 1933 04/06/17 0233 04/07/17 0830 04/07/17 0900 04/08/17 0550 04/09/17 0448  WBC 7.0 7.7 7.1  --  9.2 8.2  HGB 8.1* 7.4* 6.0* 6.0* 7.7* 7.7*  HCT 23.6* 21.2* 17.3* 17.2* 23.2* 23.0*  MCV 79.2 79.7 79.0  --  81.4 84.2  PLT 95* 103* 104*  --  116* 146*    Recent  Labs Lab 04/03/17 2324 04/05/17 0149  TROPONINI 0.04* 0.10*    Recent Labs Lab 04/07/17 2130 04/08/17 0739 04/08/17 1622 04/08/17 2111 04/09/17 0804  GLUCAP 133* 96 107* 116* 95   Lab Results  Component Value Date   PTH 1,495 (H) 03/03/2017   CALCIUM 8.7 (L) 04/09/2017   PHOS 3.2 04/09/2017    Scheduled Medications: . amLODipine  2.5 mg Oral QHS  . calcitRIOL  0.5 mcg Oral Daily  . darbepoetin (ARANESP) injection - DIALYSIS  200 mcg Intravenous Q Thu-HD  . feeding supplement (NEPRO CARB STEADY)  237 mL Oral TID BM  . insulin aspart  0-9  Units Subcutaneous TID WC  . labetalol  200 mg Oral BID  . multivitamin  1 tablet Oral QHS  . pantoprazole  40 mg Oral BID AC  . sevelamer carbonate  400 mg Oral TID WC  . triamcinolone cream  1 application Topical BID    Assessment/Plan:  1. ESRD: HD 8/2, 8/3, 8/4, 8/6, and 8/7. Numbers continue to show slow improvement.  Dr. Scot Dock has seen for access placement and is tentatively scheduled for Thursday of this week assuming stability of Hb. CLIPPED to [facility] MWF 2nd shift. No heparin with HD for at least 2 more weeks and if Hb stable out then tight. 2. Metabolic acidosis - dialysis controlled  3. Acute UGI bleed 2/2 M-W tear. s/p total of 8 units of blood since presentation (last 2 units were on 8/6, Hb stable 7.7 since then) 4. Hypertension - currently amlodipine and labetolol.  5. Anemia of ESRD/ABLA: Started aranesp 200 QThursday  in addition to PRBCs. Feraheme was ordered, I don't think was ever given. Rx'ing ferrlecit 250 IV QD X 4 doses (1st dose was 8/7)  6. CKD-MBD - PTH 1495, phos 7/2 was 10.2- down to 4.9 with just HD. Add binder (Renvela).  On daily calcitriol 0.5 mcg - change to 1 mcg on MWF with HD.  7. Nutrition: will need RD c/s on low phos diet  Disposition - if can get access done on Thursday, next  HD on Friday here, could be discharged by the weekend.   Jamal Maes, MD [facility] [phone]  Pager 04/09/2017, 12:05 PM

## 2017-04-10 ENCOUNTER — Inpatient Hospital Stay (HOSPITAL_COMMUNITY): Payer: Medicaid Other

## 2017-04-10 ENCOUNTER — Encounter (HOSPITAL_COMMUNITY): Payer: Self-pay | Admitting: Orthopedic Surgery

## 2017-04-10 ENCOUNTER — Encounter (HOSPITAL_COMMUNITY)
Admission: EM | Disposition: A | Discharge status: Home / Self Care | Payer: Self-pay | Source: Home / Self Care | Attending: Internal Medicine

## 2017-04-10 ENCOUNTER — Inpatient Hospital Stay (HOSPITAL_COMMUNITY): Payer: Medicaid Other | Admitting: Certified Registered Nurse Anesthetist

## 2017-04-10 ENCOUNTER — Telehealth: Payer: Self-pay | Admitting: Vascular Surgery

## 2017-04-10 DIAGNOSIS — Z992 Dependence on renal dialysis: Secondary | ICD-10-CM

## 2017-04-10 DIAGNOSIS — N186 End stage renal disease: Secondary | ICD-10-CM

## 2017-04-10 HISTORY — PX: AV FISTULA PLACEMENT: SHX1204

## 2017-04-10 HISTORY — PX: INSERTION OF DIALYSIS CATHETER: SHX1324

## 2017-04-10 LAB — BASIC METABOLIC PANEL
ANION GAP: 15 (ref 5–15)
BUN: 24 mg/dL — ABNORMAL HIGH (ref 6–20)
CALCIUM: 8.5 mg/dL — AB (ref 8.9–10.3)
CO2: 23 mmol/L (ref 22–32)
Chloride: 100 mmol/L — ABNORMAL LOW (ref 101–111)
Creatinine, Ser: 10.04 mg/dL — ABNORMAL HIGH (ref 0.61–1.24)
GFR, EST AFRICAN AMERICAN: 7 mL/min — AB (ref >60)
GFR, EST NON AFRICAN AMERICAN: 6 mL/min — AB (ref >60)
Glucose, Bld: 89 mg/dL (ref 65–99)
POTASSIUM: 3.9 mmol/L (ref 3.5–5.1)
Sodium: 138 mmol/L (ref 135–145)

## 2017-04-10 LAB — GLUCOSE, CAPILLARY
GLUCOSE-CAPILLARY: 85 mg/dL (ref 65–99)
GLUCOSE-CAPILLARY: 76 mg/dL (ref 65–99)
Glucose-Capillary: 78 mg/dL (ref 65–99)
Glucose-Capillary: 140 mg/dL — ABNORMAL HIGH (ref 65–99)
Glucose-Capillary: 138 mg/dL — ABNORMAL HIGH (ref 65–99)

## 2017-04-10 LAB — CBC
HEMATOCRIT: 21.5 % — AB (ref 39.0–52.0)
Hemoglobin: 7.1 g/dL — ABNORMAL LOW (ref 13.0–17.0)
MCH: 28 pg (ref 26.0–34.0)
MCHC: 33 g/dL (ref 30.0–36.0)
MCV: 84.6 fL (ref 78.0–100.0)
PLATELETS: 168 10*3/uL (ref 150–400)
RBC: 2.54 MIL/uL — ABNORMAL LOW (ref 4.22–5.81)
RDW: 17.3 % — AB (ref 11.5–15.5)
WBC: 9.5 10*3/uL (ref 4.0–10.5)

## 2017-04-10 LAB — SURGICAL PCR SCREEN
MRSA, PCR: NEGATIVE
STAPHYLOCOCCUS AUREUS: NEGATIVE

## 2017-04-10 SURGERY — ARTERIOVENOUS (AV) FISTULA CREATION
Anesthesia: Monitor Anesthesia Care | Site: Chest | Laterality: Right

## 2017-04-10 MED ORDER — LIDOCAINE-EPINEPHRINE (PF) 1 %-1:200000 IJ SOLN
INTRAMUSCULAR | Status: AC
  Filled 2017-04-10: qty 30

## 2017-04-10 MED ORDER — DEXAMETHASONE SODIUM PHOSPHATE 10 MG/ML IJ SOLN
INTRAMUSCULAR | Status: DC | PRN
  Administered 2017-04-10: 10 mg via INTRAVENOUS

## 2017-04-10 MED ORDER — OXYCODONE-ACETAMINOPHEN 5-325 MG PO TABS
1.0000 | ORAL_TABLET | ORAL | Status: DC | PRN
  Administered 2017-04-11 (×2): 1 via ORAL
  Filled 2017-04-10 (×2): qty 1

## 2017-04-10 MED ORDER — HEPARIN SODIUM (PORCINE) 1000 UNIT/ML IJ SOLN
INTRAMUSCULAR | Status: AC
  Filled 2017-04-10: qty 1

## 2017-04-10 MED ORDER — PROPOFOL 10 MG/ML IV BOLUS
INTRAVENOUS | Status: DC | PRN
  Administered 2017-04-10 (×3): 20 mg via INTRAVENOUS

## 2017-04-10 MED ORDER — HEPARIN SODIUM (PORCINE) 1000 UNIT/ML IJ SOLN
INTRAMUSCULAR | Status: DC | PRN
  Administered 2017-04-10: 1000 [IU]

## 2017-04-10 MED ORDER — LIDOCAINE-EPINEPHRINE (PF) 1 %-1:200000 IJ SOLN
INTRAMUSCULAR | Status: DC | PRN
  Administered 2017-04-10: 25 mL

## 2017-04-10 MED ORDER — SODIUM CHLORIDE 0.9 % IV SOLN
INTRAVENOUS | Status: DC | PRN
  Administered 2017-04-10: 13:00:00 500 mL

## 2017-04-10 MED ORDER — FENTANYL CITRATE (PF) 250 MCG/5ML IJ SOLN
INTRAMUSCULAR | Status: AC
  Filled 2017-04-10: qty 5

## 2017-04-10 MED ORDER — CEFAZOLIN SODIUM-DEXTROSE 2-4 GM/100ML-% IV SOLN
INTRAVENOUS | Status: AC
  Filled 2017-04-10: qty 100

## 2017-04-10 MED ORDER — PROPOFOL 1000 MG/100ML IV EMUL
5.0000 ug/kg/min | INTRAVENOUS | Status: DC
  Filled 2017-04-10 (×2): qty 100

## 2017-04-10 MED ORDER — ONDANSETRON HCL 4 MG/2ML IJ SOLN
INTRAMUSCULAR | Status: DC | PRN
  Administered 2017-04-10: 4 mg via INTRAVENOUS

## 2017-04-10 MED ORDER — LIDOCAINE-EPINEPHRINE 1 %-1:100000 IJ SOLN
INTRAMUSCULAR | Status: AC
  Filled 2017-04-10: qty 1

## 2017-04-10 MED ORDER — CEFAZOLIN SODIUM-DEXTROSE 2-4 GM/100ML-% IV SOLN
2.0000 g | Freq: Once | INTRAVENOUS | Status: AC
  Administered 2017-04-10: 2 g via INTRAVENOUS

## 2017-04-10 MED ORDER — PROPOFOL 10 MG/ML IV BOLUS
INTRAVENOUS | Status: AC
  Filled 2017-04-10: qty 20

## 2017-04-10 MED ORDER — PROPOFOL 500 MG/50ML IV EMUL
INTRAVENOUS | Status: DC | PRN
  Administered 2017-04-10: 80 ug/kg/min via INTRAVENOUS

## 2017-04-10 MED ORDER — FENTANYL CITRATE (PF) 100 MCG/2ML IJ SOLN
INTRAMUSCULAR | Status: AC
  Filled 2017-04-10: qty 2

## 2017-04-10 MED ORDER — CHLORHEXIDINE GLUCONATE CLOTH 2 % EX PADS
6.0000 | MEDICATED_PAD | Freq: Every day | CUTANEOUS | Status: DC
  Administered 2017-04-10: 6 via TOPICAL

## 2017-04-10 MED ORDER — FENTANYL CITRATE (PF) 100 MCG/2ML IJ SOLN
50.0000 ug | Freq: Once | INTRAMUSCULAR | Status: AC
  Administered 2017-04-10: 50 ug via INTRAVENOUS

## 2017-04-10 MED ORDER — FENTANYL CITRATE (PF) 250 MCG/5ML IJ SOLN
INTRAMUSCULAR | Status: DC | PRN
  Administered 2017-04-10: 50 ug via INTRAVENOUS

## 2017-04-10 MED ORDER — DEXAMETHASONE SODIUM PHOSPHATE 10 MG/ML IJ SOLN
INTRAMUSCULAR | Status: AC
  Filled 2017-04-10: qty 1

## 2017-04-10 MED ORDER — 0.9 % SODIUM CHLORIDE (POUR BTL) OPTIME
TOPICAL | Status: DC | PRN
  Administered 2017-04-10: 1000 mL

## 2017-04-10 SURGICAL SUPPLY — 56 items
ARMBAND PINK RESTRICT EXTREMIT (MISCELLANEOUS) ×8 IMPLANT
BAG DECANTER FOR FLEXI CONT (MISCELLANEOUS) ×4 IMPLANT
BIOPATCH RED 1 DISK 7.0 (GAUZE/BANDAGES/DRESSINGS) ×3 IMPLANT
BIOPATCH RED 1IN DISK 7.0MM (GAUZE/BANDAGES/DRESSINGS) ×1
CANISTER SUCT 3000ML PPV (MISCELLANEOUS) ×4 IMPLANT
CATH PALINDROME RT-P 15FX19CM (CATHETERS) IMPLANT
CATH PALINDROME RT-P 15FX23CM (CATHETERS) ×4 IMPLANT
CATH PALINDROME RT-P 15FX28CM (CATHETERS) IMPLANT
CATH PALINDROME RT-P 15FX55CM (CATHETERS) IMPLANT
CATH STRAIGHT 5FR 65CM (CATHETERS) IMPLANT
CLIP VESOCCLUDE MED 6/CT (CLIP) ×4 IMPLANT
CLIP VESOCCLUDE SM WIDE 6/CT (CLIP) ×4 IMPLANT
COVER PROBE W GEL 5X96 (DRAPES) ×4 IMPLANT
COVER SURGICAL LIGHT HANDLE (MISCELLANEOUS) ×4 IMPLANT
DECANTER SPIKE VIAL GLASS SM (MISCELLANEOUS) ×4 IMPLANT
DERMABOND ADVANCED (GAUZE/BANDAGES/DRESSINGS) ×2
DERMABOND ADVANCED .7 DNX12 (GAUZE/BANDAGES/DRESSINGS) ×2 IMPLANT
DRAPE C-ARM 42X72 X-RAY (DRAPES) ×4 IMPLANT
DRAPE CHEST BREAST 15X10 FENES (DRAPES) ×4 IMPLANT
ELECT REM PT RETURN 9FT ADLT (ELECTROSURGICAL) ×4
ELECTRODE REM PT RTRN 9FT ADLT (ELECTROSURGICAL) ×2 IMPLANT
GAUZE SPONGE 4X4 16PLY XRAY LF (GAUZE/BANDAGES/DRESSINGS) ×4 IMPLANT
GLOVE BIO SURGEON STRL SZ7 (GLOVE) ×4 IMPLANT
GLOVE BIOGEL PI IND STRL 6.5 (GLOVE) ×4 IMPLANT
GLOVE BIOGEL PI IND STRL 7.5 (GLOVE) ×2 IMPLANT
GLOVE BIOGEL PI INDICATOR 6.5 (GLOVE) ×4
GLOVE BIOGEL PI INDICATOR 7.5 (GLOVE) ×2
GLOVE SURG SS PI 6.5 STRL IVOR (GLOVE) ×8 IMPLANT
GOWN STRL REUS W/ TWL LRG LVL3 (GOWN DISPOSABLE) ×6 IMPLANT
GOWN STRL REUS W/TWL LRG LVL3 (GOWN DISPOSABLE) ×6
HEMOSTAT SPONGE AVITENE ULTRA (HEMOSTASIS) IMPLANT
KIT BASIN OR (CUSTOM PROCEDURE TRAY) ×4 IMPLANT
KIT ROOM TURNOVER OR (KITS) ×4 IMPLANT
NEEDLE 18GX1X1/2 (RX/OR ONLY) (NEEDLE) ×4 IMPLANT
NEEDLE HYPO 25GX1X1/2 BEV (NEEDLE) ×4 IMPLANT
NS IRRIG 1000ML POUR BTL (IV SOLUTION) ×4 IMPLANT
PACK CV ACCESS (CUSTOM PROCEDURE TRAY) ×4 IMPLANT
PACK SURGICAL SETUP 50X90 (CUSTOM PROCEDURE TRAY) ×4 IMPLANT
PAD ARMBOARD 7.5X6 YLW CONV (MISCELLANEOUS) ×8 IMPLANT
SET MICROPUNCTURE 5F STIFF (MISCELLANEOUS) IMPLANT
SOAP 2 % CHG 4 OZ (WOUND CARE) ×4 IMPLANT
SUT ETHILON 3 0 PS 1 (SUTURE) ×4 IMPLANT
SUT MNCRL AB 4-0 PS2 18 (SUTURE) ×8 IMPLANT
SUT PROLENE 6 0 BV (SUTURE) ×4 IMPLANT
SUT PROLENE 7 0 BV 1 (SUTURE) ×4 IMPLANT
SUT VIC AB 3-0 SH 27 (SUTURE) ×4
SUT VIC AB 3-0 SH 27X BRD (SUTURE) ×4 IMPLANT
SYR 10ML LL (SYRINGE) ×4 IMPLANT
SYR 20CC LL (SYRINGE) ×8 IMPLANT
SYR 3ML LL SCALE MARK (SYRINGE) ×4 IMPLANT
SYR 5ML LL (SYRINGE) ×4 IMPLANT
SYR CONTROL 10ML LL (SYRINGE) ×4 IMPLANT
TOWEL GREEN STERILE FF (TOWEL DISPOSABLE) ×4 IMPLANT
UNDERPAD 30X30 (UNDERPADS AND DIAPERS) ×4 IMPLANT
WATER STERILE IRR 1000ML POUR (IV SOLUTION) ×4 IMPLANT
WIRE AMPLATZ SS-J .035X180CM (WIRE) IMPLANT

## 2017-04-10 NOTE — Anesthesia Postprocedure Evaluation (Signed)
Anesthesia Post Note  Patient: Patrick Ibarra  Procedure(s) Performed: Procedure(s) (LRB): ARTERIOVENOUS (AV) FISTULA CREATION LEFT ARM (Left) INSERTION OF DIALYSIS CATHETER - RIGHT INTERNAL JUGULAR PLACEMENT (Right)     Patient location during evaluation: PACU Anesthesia Type: MAC Level of consciousness: awake and alert Pain management: pain level controlled Vital Signs Assessment: post-procedure vital signs reviewed and stable Respiratory status: spontaneous breathing, nonlabored ventilation, respiratory function stable and patient connected to nasal cannula oxygen Cardiovascular status: stable and blood pressure returned to baseline Anesthetic complications: no    Last Vitals:  Vitals:   04/10/17 1415 04/10/17 1450  BP: (!) 165/108 (!) 170/104  Pulse: 85 84  Resp: 10 18  Temp: 36.7 C 36.5 C  SpO2: 100% 100%    Last Pain:  Vitals:   04/10/17 1450  TempSrc: Oral  PainSc:                  Audry Pili

## 2017-04-10 NOTE — Telephone Encounter (Signed)
-----   Message from Mena Goes, RN sent at 04/10/2017  1:50 PM EDT ----- Regarding: 4-6 weeks    ----- Message ----- From: Alvia Grove, PA-C Sent: 04/10/2017   1:31 PM To: [facility]vs Charge Pool  S/p left brachial-cephalic AV fistula 01/09/92  F/u with Dr. Bridgett Larsson in 4-6 weeks. No duplex  Thanks Maudie Mercury

## 2017-04-10 NOTE — Anesthesia Procedure Notes (Signed)
Procedure Name: MAC Date/Time: 04/10/2017 11:22 AM Performed by: Mervyn Gay Pre-anesthesia Checklist: Patient identified, Patient being monitored, Timeout performed, Emergency Drugs available and Suction available Patient Re-evaluated:Patient Re-evaluated prior to induction Oxygen Delivery Method: Simple face mask Preoxygenation: Pre-oxygenation with 100% oxygen Number of attempts: 1 Placement Confirmation: positive ETCO2 Dental Injury: Teeth and Oropharynx as per pre-operative assessment

## 2017-04-10 NOTE — H&P (View-Only) (Signed)
Patient name: Patrick Ibarra MRN: [id number] DOB: [birth year] Sex: male   REASON FOR CONSULT:    Needs tunneled dialysis catheter and AV fistula or AV graft. The consult is requested by Dr. Moshe Cipro.  HPI:   Patrick Ibarra is a pleasant 36 y.o. male,  who was admitted last night with hematemesis, dizziness, and melena. He has no previous history of GI bleeds. The patient had been admitted with a hypertensive emergency on June 30. Nephrology saw the patient at that time he was found to have stage V chronic kidney disease. It was felt that he would need dialysis. He refused dialysis through that admission and palliative care was consult. On this admission, the patient states that he is amenable to dialysis.  The patient had a temporary right IJ dialysis catheter placed last night by CCM. Vein mapping has been ordered.  The patient is right-handed. His main complaints are some weight loss, poor appetite, fatigue, and some dyspnea on exertion.  Patient is also noted to have an elevated lipase and possible pancreatitis. He also has significant anemia. He has not yet received any blood according to his wife.  GI has been consult.  Past Medical History:  Diagnosis Date  . Hypertension   . LV dysfunction   . Obesity   . Uncontrolled diabetes mellitus ([place])     Family History  Problem Relation Age of Onset  . Hypertension Mother   . Diabetes Mother   . Hypertension Father     SOCIAL HISTORY: Social History   Social History  . Marital status: Married    Spouse name: N/A  . Number of children: N/A  . Years of education: N/A   Occupational History  . Not on file.   Social History Main Topics  . Smoking status: Never Smoker  . Smokeless tobacco: Never Used  . Alcohol use Yes     Comment: WINE ONCE A WEEK  . Drug use: No  . Sexual activity: Not on file   Other Topics Concern  . Not on file   Social History Narrative   He is married with step kids. He works at [employer].     No Known Allergies  Current Facility-Administered Medications  Medication Dose Route Frequency Provider Last Rate Last Dose  . 0.9 %  sodium chloride infusion   Intravenous Once Madelon Lips, MD      . 0.9 %  sodium chloride infusion  100 mL Intravenous PRN Madelon Lips, MD      . 0.9 %  sodium chloride infusion  100 mL Intravenous PRN Madelon Lips, MD      . alteplase (CATHFLO ACTIVASE) injection 2 mg  2 mg Intracatheter Once PRN Madelon Lips, MD      . calcitRIOL (ROCALTROL) capsule 0.25 mcg  0.25 mcg Oral Daily Collier Salina, MD   0.25 mcg at 04/04/17 0920  . Darbepoetin Alfa (ARANESP) injection 200 mcg  200 mcg Intravenous Q Thu-HD Madelon Lips, MD   200 mcg at 04/04/17 0205  . iopamidol (ISOVUE-300) 61 % injection 30 mL  30 mL Oral Once PRN Deno Etienne, DO      . lidocaine (PF) (XYLOCAINE) 1 % injection 5 mL  5 mL Intradermal PRN Madelon Lips, MD      . lidocaine-prilocaine (EMLA) cream 1 application  1 application Topical PRN Madelon Lips, MD      . LORazepam (ATIVAN) 2 MG/ML injection           .  pantoprazole (PROTONIX) 80 mg in sodium chloride 0.9 % 250 mL (0.32 mg/mL) infusion  8 mg/hr Intravenous Continuous Carol Ada, MD 25 mL/hr at 04/04/17 1024 8 mg/hr at 04/04/17 1024  . pentafluoroprop-tetrafluoroeth (GEBAUERS) aerosol 1 application  1 application Topical PRN Madelon Lips, MD      . sodium bicarbonate tablet 650 mg  650 mg Oral TID Collier Salina, MD   650 mg at 04/04/17 0920  . triamcinolone cream (KENALOG) 0.1 % 1 application  1 application Topical BID Collier Salina, MD   1 application at 42/59/56 1000    REVIEW OF SYSTEMS:  [X]  denotes positive finding, [ ]  denotes negative finding Cardiac  Comments:  Chest pain or chest pressure:    Shortness of breath upon exertion: X   Short of breath when lying flat:    Irregular heart rhythm:        Vascular    Pain in calf, thigh, or hip brought on by ambulation:     Pain in feet at night that wakes you up from your sleep:     Blood clot in your veins:    Leg swelling:  X Bilateral       Pulmonary    Oxygen at home:    Productive cough:     Wheezing:         Neurologic    Sudden weakness in arms or legs:     Sudden numbness in arms or legs:     Sudden onset of difficulty speaking or slurred speech:    Temporary loss of vision in one eye:     Problems with dizziness:         Gastrointestinal    Blood in stool:  X   Vomited blood:  X       Genitourinary    Burning when urinating:     Blood in urine:        Psychiatric    Major depression:         Hematologic    Bleeding problems:    Problems with blood clotting too easily:        Skin    Rashes or ulcers:        Constitutional    Fever or chills:     PHYSICAL EXAM:   Vitals:   04/04/17 0300 04/04/17 0305 04/04/17 0312 04/04/17 0400  BP: (!) 91/41 (!) 105/54 114/64 129/68  Pulse: 87 87 84 84  Resp: 20 14 10 12   Temp:    97.7 F (36.5 C)  TempSrc:    Oral  SpO2: 100% 100% 100% 98%  Weight:   246 lb 14.6 oz (112 kg) 241 lb 13.5 oz (109.7 kg)  Height:    6\' 4"  (1.93 m)    GENERAL: The patient is a well-nourished male, in no acute distress. The vital signs are documented above. CARDIAC: There is a regular rate and rhythm.  VASCULAR: He has a right IJ catheter and I cannot assess or a right carotid bruit. I do not appreciate a left carotid bruit. He has a brachial and radial pulse bilaterally. He has dorsalis pedis pulses bilaterally. He has bilateral lower extremity swelling. He has an IV on the dorsal aspect of his left hand and an IV in the mid forearm on the left. PULMONARY: There is good air exchange bilaterally without wheezing or rales. ABDOMEN: Soft and non-tender with normal pitched bowel sounds.  MUSCULOSKELETAL: There are no major deformities or cyanosis. NEUROLOGIC: No focal  weakness or paresthesias are detected. SKIN: There are no ulcers or rashes  noted. PSYCHIATRIC: The patient has a normal affect.  DATA:    VEIN MAP: Vein mapping has been ordered and is pending.  LABS:   Potassium is 4.6.  GFR is 1.  Hemoglobin is 6.7.  Platelet count 121,000.  Lipase is 133.  MEDICAL ISSUES:   END-STAGE RENAL DISEASE: The patient presents with a GI bleed. Currently he is very weak and somewhat lethargic. His hemoglobin is 6.7. GI has been consult in and I believe they plan endoscopy. He is also point to be transfused according to the wife. He has a temporary dialysis catheter which she can use for now for dialysis. I would be reluctant to take him to the operating room for placement of a tunneled dialysis catheter and access in his arm currently as we would have to heparinize him. I would like to wait until after his GI workup is complete to be sure that it is safe to proceed. In the meantime, I will follow up on his vein map. If he has reasonable veins in the left arm will have to move his IVs to the right arm. Currently he seems very weak and will need to be stronger before we can proceed safely with surgery. I have discussed the situation and our plans with the patient's wife.  Deitra Mayo [facility] [phone]

## 2017-04-10 NOTE — Anesthesia Preprocedure Evaluation (Signed)
Anesthesia Evaluation  Patient identified by MRN, date of birth, ID band Patient awake    Reviewed: Allergy & Precautions, H&P , Patient's Chart, lab work & pertinent test results, reviewed documented beta blocker date and time   Airway Mallampati: II  TM Distance: >3 FB Neck ROM: full    Dental no notable dental hx.    Pulmonary    Pulmonary exam normal breath sounds clear to auscultation       Cardiovascular hypertension,  Rhythm:regular Rate:Normal     Neuro/Psych    GI/Hepatic   Endo/Other    Renal/GU      Musculoskeletal   Abdominal   Peds  Hematology   Anesthesia Other Findings   Reproductive/Obstetrics                             Anesthesia Physical Anesthesia Plan  ASA: III  Anesthesia Plan: MAC   Post-op Pain Management:    Induction: Intravenous  PONV Risk Score and Plan: 1 and Ondansetron and Dexamethasone  Airway Management Planned: Mask and Natural Airway  Additional Equipment:   Intra-op Plan:   Post-operative Plan:   Informed Consent: I have reviewed the patients History and Physical, chart, labs and discussed the procedure including the risks, benefits and alternatives for the proposed anesthesia with the patient or authorized representative who has indicated his/her understanding and acceptance.   Dental Advisory Given  Plan Discussed with: CRNA and Surgeon  Anesthesia Plan Comments:         Anesthesia Quick Evaluation

## 2017-04-10 NOTE — Progress Notes (Addendum)
Subjective:  No acute events overnight. Doing well this afternoon following his AVF and TDC procedure. Appetite is good. Denies any nausea or vomiting. No further melena.   Objective:  Vital signs in last 24 hours: Vitals:   04/10/17 1345 04/10/17 1400 04/10/17 1415 04/10/17 1450  BP: (!) 158/109 (!) 162/107 (!) 165/108 (!) 170/104  Pulse: 86 88 85 84  Resp: 18 13 10 18   Temp: 97.6 F (36.4 C)  98 F (36.7 C) 97.7 F (36.5 C)  TempSrc:    Oral  SpO2: 100% 100% 100% 100%  Weight:      Height:       General: alert, well-developed, and cooperative to examination.  Lungs: normal respiratory effort, normal breath sounds, no crackles, and no wheezes. Heart: normal rate, regular rhythm, no murmur, no gallop, and no rub.  Abdomen: soft, non-tender, normal bowel sounds, no distention Skin: R IJ TDC in place. L arm bandaged from AVF procedure.   CBC    Component Value Date/Time   WBC 9.5 04/10/2017 0404   RBC 2.54 (L) 04/10/2017 0404   HGB 7.1 (L) 04/10/2017 0404   HGB 7.3 (L) 03/18/2017 0942   HCT 21.5 (L) 04/10/2017 0404   HCT 21.8 (L) 03/18/2017 0942   PLT 168 04/10/2017 0404   PLT 143 (L) 03/18/2017 0942   MCV 84.6 04/10/2017 0404   MCV 81 03/18/2017 0942   MCH 28.0 04/10/2017 0404   MCHC 33.0 04/10/2017 0404   RDW 17.3 (H) 04/10/2017 0404   RDW 17.0 (H) 03/18/2017 0942   LYMPHSABS 1.0 12/21/2013 0510   MONOABS 0.1 12/21/2013 0510   EOSABS 0.0 12/21/2013 0510   BASOSABS 0.0 12/21/2013 0510   BMET    Component Value Date/Time   NA 138 04/10/2017 0404   NA 134 03/18/2017 0942   K 3.9 04/10/2017 0404   CL 100 (L) 04/10/2017 0404   CO2 23 04/10/2017 0404   GLUCOSE 89 04/10/2017 0404   BUN 24 (H) 04/10/2017 0404   BUN 135 (HH) 03/18/2017 0942   CREATININE 10.04 (H) 04/10/2017 0404   CREATININE 3.16 (H) 01/05/2014 1459   CALCIUM 8.5 (L) 04/10/2017 0404   CALCIUM 7.4 (L) 03/01/2017 0752   GFRNONAA 6 (L) 04/10/2017 0404   GFRAA 7 (L) 04/10/2017 0404    Assessment/Plan:  Mallory-Weiss Tear: Last episode of hematemesis 8/5. Stools no larger dark and tarry. Hb today 7.1 (7.7 yesterday). Last transfusion 8/6. (Has received 8 units total). Mallory-Weiss tear likely healing. - daily CBCs, transfuse with HD if Hb <7.3 - PO Protonix BID - Renal diet  ESRD Stage 5 with uremia > now on dialysis Uremic platelet dysfunction improving with aggressive hemodialysis. Patient continues to think about his future and dialysis needs. More open to conversation and engaged in his care. - Vascular surgery following, appreciate their help --- OR today for AVF & [facility] - Nephrology following, appreciate recs --- Patient has a spot at [facility] for outpt dialysis MWF --- calcitriol 32mcg on MWF with HD --- HD here Friday - Renal diet - BMET tomorrow AM  HTN: SBP in 160s-170s. Treating conservatively. - Continue amlodipine 2.5mg  qday & labetalol 200 BID  Anemia of Chronic Disease: Ferrlecit x4 doses and Aranesp. Now s/p 8 pRBC transfusions total. (Last transfusion 8/6) - f/u CBC, transfuse if Hb<7.3 with hemodialysis  Right heel pain, early plantar fasciitis vs pain with flat feet - Encouraged patient to do foot stretches and roll his foot on a cylindrical object before standing up  in the morning - Continue to monitor  Situational Depression: Denies SI or HI. Beck Depression Inventory at 45, consistent with moderate depression. However, I believe that this is an acute response to his situation. I will not put him on medication for now and will continue to monitor. - Continue to monitor  Dispo: Anticipated discharge if stable in 1 days.  Maryellen Pile, MD  04/10/2017, 4:34 PM Pager: [phone]  Internal Medicine Attending  Date: 04/10/2017  Patient name: Patrick Ibarra Medical record number: [id number] Date of birth: [birth year] Age: 36 y.o. Gender: male  I saw and evaluated the patient. I reviewed the resident's note by Dr. Charlynn Grimes and I agree with the  resident's findings and plans as documented in his progress note.  When seen on rounds this afternoon after he returned from his procedure he was feeling well and without complaints. His most recent bowel movement was brown and not melanic. He and his significant other are excited about his potential discharge tomorrow after hemodialysis.

## 2017-04-10 NOTE — Interval H&P Note (Signed)
History and Physical Interval Note:  04/10/2017 11:17 AM  Patrick Ibarra  has presented today for surgery, with the diagnosis of end stage renal disease  The various methods of treatment have been discussed with the patient and family. After consideration of risks, benefits and other options for treatment, the patient has consented to  Procedure(s): ARTERIOVENOUS (AV) FISTULA CREATION LEFT ARM (Left) INSERTION OF DIALYSIS CATHETER (N/A) as a surgical intervention .  [name] history has been reviewed, patient examined, no change in status, stable for surgery.  I have reviewed the patient's chart and labs.  Questions were answered to the patient's satisfaction.     Adele Barthel

## 2017-04-10 NOTE — Progress Notes (Signed)
[facility] Rounding Note  Subjective:    HD#5  For access placement (hopefully) today (currently NPO) Has outpt HD spot at [facility] on MWF schedule 2nd shift   Objective Vital signs in last 24 hours: Vitals:   04/09/17 1000 04/09/17 1736 04/09/17 2135 04/10/17 0458  BP: (!) 150/74 (!) 157/107 (!) 170/108 (!) 172/99  Pulse: 100 89 92 (!) 103  Resp: 20 20 16 17   Temp: 98.8 F (37.1 C) 98.7 F (37.1 C) 98 F (36.7 C) 99.4 F (37.4 C)  TempSrc: Oral Oral    SpO2: 100% 100% 99% 100%  Weight:   101.6 kg (224 lb)   Height:       Weight change: -1.094 kg (-2 lb 6.6 oz)  Intake/Output Summary (Last 24 hours) at 04/10/17 0834 Last data filed at 04/10/17 0600  Gross per 24 hour  Intake              600 ml  Output              200 ml  Net              400 ml    Physical Exam: Pt in NAD VS as noted Regular rhythm S1S2 No S3 Lungs clear Abdomen is obese, protuberant, slightly tender No LE edema Dialysis Access: Right IJ vascath placed 8/2 No asterixus    Recent Labs Lab 04/07/17 0730 04/08/17 0550 04/09/17 0448 04/10/17 0404  NA 138 135 137 138  K 2.9* 3.4* 3.9 3.9  CL 102 99* 102 100*  CO2 23 25 23 23   GLUCOSE 85 90 89 89  BUN 99* 37* 16 24*  CREATININE 20.25* 11.22* 7.12* 10.04*  CALCIUM 8.0* 8.2* 8.7* 8.5*  PHOS 5.5* 4.3 3.2  --     Recent Labs Lab 04/03/17 1243 04/04/17 0045  04/07/17 0730 04/08/17 0550 04/09/17 0448  AST 8*  --   --   --   --   --   ALT 7* 9*  --   --   --   --   ALKPHOS 68  --   --   --   --   --   BILITOT 0.7  --   --   --   --   --   PROT 6.5  --   --   --   --   --   ALBUMIN 3.0*  --   < > 2.6* 2.8* 2.9*  < > = values in this interval not displayed.  Recent Labs Lab 04/03/17 1243  LIPASE 133*    Recent Labs Lab 04/06/17 0233 04/07/17 0830  04/08/17 0550 04/09/17 0448 04/10/17 0404  WBC 7.7 7.1  --  9.2 8.2 9.5  HGB 7.4* 6.0*  < > 7.7* 7.7* 7.1*  HCT 21.2* 17.3*  < > 23.2* 23.0* 21.5*  MCV 79.7 79.0  --  81.4 84.2 84.6   PLT 103* 104*  --  116* 146* 168  < > = values in this interval not displayed.  Recent Labs Lab 04/03/17 2324 04/05/17 0149  TROPONINI 0.04* 0.10*    Recent Labs Lab 04/09/17 0804 04/09/17 1219 04/09/17 1610 04/09/17 2132 04/10/17 0739  GLUCAP 95 107* 116* 104* 85   Lab Results  Component Value Date   PTH 1,495 (H) 03/03/2017   CALCIUM 8.5 (L) 04/10/2017   PHOS 3.2 04/09/2017    Scheduled Medications: . amLODipine  2.5 mg Oral QHS  . [START ON 04/11/2017] calcitRIOL  1 mcg Oral Q  M,W,F-HD  . Chlorhexidine Gluconate Cloth  6 each Topical Q0600  . darbepoetin (ARANESP) injection - DIALYSIS  200 mcg Intravenous Q Thu-HD  . feeding supplement (NEPRO CARB STEADY)  237 mL Oral TID BM  . insulin aspart  0-9 Units Subcutaneous TID WC  . labetalol  200 mg Oral BID  . multivitamin  1 tablet Oral QHS  . pantoprazole  40 mg Oral BID AC  . sevelamer carbonate  400 mg Oral TID WC  . triamcinolone cream  1 application Topical BID    Assessment/Plan:  1. ESRD: HD 8/2, 8/3, 8/4, 8/6, and 8/7. Numbers continue to show improvement.  Dr. Scot Dock has seen for access placement and is tentatively scheduled for today. CLIPPED to [facility] MWF 2nd shift. No heparin with HD for at least 2 more weeks and if Hb stable out then tight. HD tomorrow to get onto outpt schedule. 2. Acute UGI bleed 2/2 M-W tear. s/p total of 8 units of blood since presentation (last 2 units were on 8/6, Hb 7.1 today) If Hb <7.3 tomorrow will give a unit on HD then.  3. Hypertension - currently amlodipine and labetolol.  4. Anemia of ESRD/ABLA: Started aranesp 200 QThursday  in addition to PRBCs. Feraheme was ordered, I don't think was ever given. Rx'ing ferrlecit 250 IV QD X 4 doses (1st dose was 8/7)  5. CKD-MBD - PTH 1495, phos 7/2 was 10.2- down to 4.9 with just HD. Added binder (Renvela).  On daily calcitriol 0.5 mcg - changed to 1 mcg on MWF with HD.  6. Nutrition: will need RD c/s on low phos diet  Jamal Maes,  MD [facility] [phone] Pager 04/10/2017, 8:34 AM

## 2017-04-10 NOTE — Telephone Encounter (Signed)
Sched appt 05/16/17 at 11:45. Lm on hm#.

## 2017-04-10 NOTE — Op Note (Signed)
OPERATIVE NOTE  PROCEDURE: 1.  Right internal jugular vein temporary dialysis catheter removal 2.  Right internal jugular vein tunneled dialysis catheter placement 3.  Right internal jugular vein cannulation under ultrasound guidance 4.  Left brachiocephalic arteriovenous fistula   PRE-OPERATIVE DIAGNOSIS: end-stage renal failure  POST-OPERATIVE DIAGNOSIS: same as above  SURGEON: Adele Barthel, MD  ANESTHESIA: local and MAC  ESTIMATED BLOOD LOSS: 30 cc  FINDING(S): 1.  Tips of the catheter in the right atrium on fluoroscopy 2.  No obvious pneumothorax on fluoroscopy 3.  Cephalic vein: 2 mm distally, 4 mm at antecubitum, acceptable 4.  Large cubital vein: 4-5 mm 5.  Brachial artery: 3.5 mm, disease free 6.  Venous outflow: palpable thrill  7.  Radial flow: palpable radial pulse   SPECIMEN(S):  none  INDICATIONS:   Patrick Ibarra is a 36 y.o. male who presents with hemodialysis dependence.  The patient presents for tunneled dialysis catheter placement vs exchange and left arm arteriovenous fistula placement.  The patient is aware the risks of tunneled dialysis catheter placement include but are not limited to: bleeding, infection, central venous injury, pneumothorax, possible venous stenosis, possible malpositioning in the venous system, and possible infections related to long-term catheter presence.  Risk, benefits, and alternatives to access surgery were discussed.  The patient is aware the risks include but are not limited to: bleeding, infection, steal syndrome, nerve damage, ischemic monomelic neuropathy, thrombosis, failure to mature, complications related to venous hypertension, need for additional procedures, death and stroke.  The patient agrees to proceed forward with the procedure.   DESCRIPTION: After written full informed consent was obtained from the patient, the patient was taken back to the operating room.  Prior to induction, the patient was given IV antibiotics.   The prior bandages on the right neck temporary dialysis catheter were removed.  I felt the neck cannulation site was too high to convert the catheter.   I cut the securing stitches and removed the temporary dialysis catheter.  I held pressure on the right internal jugular vein for 3 minutes.  No bleeding occurred.  After obtaining adequate sedation, the patient was prepped and draped in the standard fashion for a chest or neck tunneled dialysis catheter placement.   The cannulation site, the catheter exit site, and tract for the subcutaneous tunnel were then anesthestized with a total of 20 cc of 1% lidocaine without epinephrine.  Under ultrasound guidance, the right internal jugular vein was cannulated with the 18 gauge needle.  A J-wire was then placed down into the inferior vena cava under fluoroscopic guidance.  The wire was then secured in place with a clamp to the drapes.  The tapered wire guide was left occluding the lumen of the needle to avoid air embolism.    I then made stab incisions at the neck and exit sites.   I dissected from the exit site to the cannulation site with a tunneler.   The wire was then unclamped and I removed the needle.  The skin tract and venotomy was dilated serially with graduated dilators, passed under fluoroscopic guidance.   Finally, the dilator-sheath was placed under fluoroscopic guidance into the superior vena cava.  The central dilator and wire were removed.  A 23 cm Palindrome catheter was placed under fluoroscopic guidance down into the right atrium.    The sheath was broken and peeled away while holding the catheter cuff at the level of the skin.  The catheter was clamped with the  plastic clamp and the back end of this catheter was transected.  One lumen was docked onto the tunneler.  The catheter was delivered through the subcutaneous tunnel by pulling the tunneler out the exit site.  The catheter was reclamped and was transected a second time, revealing the two  lumens of this catheter.  The catheter collar was loaded over the back end of the catheter.  The two  ports were docked onto these two lumens.  The catheter collar was then snapped into place, securing the two ports.  Each port was tested by aspirating and flushing.  No resistance was noted.  Each port was then thoroughly flushed with heparinized saline.  The catheter was secured in placed with two interrupted stitches of 3-0 Nylon tied to the catheter.  The neck incision was closed with a U-stitch of 4-0 Monocryl.  The neck and chest incision were cleaned.  The closed neck incision was reinforced with Dermabond and sterile bandages applied to the catheter exit site.  Each port was then loaded with concentrated heparin (1000 Units/mL) at the manufacturer recommended volumes to each port.  Sterile caps were applied to each port.  The prior exit site for the temporary dialysis catheter were bandaged with a sterile 2x2.  On completion fluoroscopy, the tips of the catheter were in the right atrium, and there was no evidence of pneumothorax.  At this point the drapes were taken down and the patient was repositioned.  He was reprepped and redraped for left arm access procedure.  I turned my attention first to identifying the patient's distal cephalic vein and radial artery.  The distal 1/5 of the cephalic split into small branches < 2 mm.  Additionally the radial artery appeared to have a calcific rim with evidence of some intraluminal plaque.  I turned my attention to the upper arm cephalic vein and brachial artery.  Using SonoSite guidance, the location of these vessels were marked out on the skin.     At this point, I injected local anesthetic to obtain a field block of the antecubitum.  In total, I injected about 5 mL of 1% lidocaine without epinephrine.  I made a tramsverse incision at the level of the antecubitum and dissected through the subcutaneous tissue and fascia to gain exposure of the brachial artery.   This was noted to be 3.5 mm in diameter externally.  This was dissected out proximally and distally and controlled with vessel loops .  I then dissected out the cephalic vein.  This was noted to be 4 mm in diameter externally.  In the process of dissecting out the cephalic vein, I encountered the cubital vein which was 4-5 mm in diameter, coursing over the brachial artery distally.  I dissected this vein to gain laxity, so I could retract it out of the way.  The distal segment of the cephalic vein was ligated with a  2-0 silk, proximal to the bifurcation into the cubital vein, and the vein was transected.  The proximal segment was interrogated with serial dilators.  The vein accepted up to a 4 mm dilator without any difficulty.  I then instilled the heparinized saline into the vein and clamped it.  At this point, I reset my exposure of the brachial artery and placed the artery under tension proximally and distally.  I made an arteriotomy with a #11 blade, and then I extended the arteriotomy with a Potts scissor.  I injected heparinized saline proximal and distal to this arteriotomy.  The vein was then sewn to the artery in an end-to-side configuration with a running stitch of 6-0 Prolene.  Prior to completing this anastomosis, I allowed the vein and artery to backbleed.  There was no evidence of clot from any vessels.  I completed the anastomosis in the usual fashion and then released all vessel loops and clamps.    There was a palpable thrill in the venous outflow, and there was a palpable radial pulse.  At this point, I irrigated out the surgical wound.  I had to place one simple stitch of 6-0 Prolene to address a pleat in the suture line.  There was no further active bleeding.  I examined the tissue adjacent to the fistula and there appeared to be some tenting with subcutaneous tissue.  I resected these bands with electrocautery.  In this process of interrogation, I identified a trifurcation in the fistula  immediately proximal to the surgical site.  I sequentially clamped each branch to identify the main cephalic vein.  I tied off the two branches with 4-0 silk ties and transected the two branches.    The subcutaneous tissue was reapproximated with a running stitch of 3-0 Vicryl.  The skin was then reapproximated with a running subcuticular stitch of 4-0 Vicryl.  The skin was then cleaned, dried, and reinforced with Dermabond.  The patient tolerated this procedure well.    COMPLICATIONS: none  CONDITION: stable   Adele Barthel, MD, [facility] [place] Office: [phone] Pager: [phone]  04/10/2017, 12:13 PM

## 2017-04-10 NOTE — Transfer of Care (Signed)
Immediate Anesthesia Transfer of Care Note  Patient: Patrick Ibarra  Procedure(s) Performed: Procedure(s): ARTERIOVENOUS (AV) FISTULA CREATION LEFT ARM (Left) INSERTION OF DIALYSIS CATHETER - RIGHT INTERNAL JUGULAR PLACEMENT (Right)  Patient Location: PACU  Anesthesia Type:MAC  Level of Consciousness: drowsy and patient cooperative  Airway & Oxygen Therapy: Patient Spontanous Breathing  Post-op Assessment: Report given to RN and Post -op Vital signs reviewed and stable  Post vital signs: Reviewed and stable  Last Vitals:  Vitals:   04/10/17 0458 04/10/17 0910  BP: (!) 172/99 (!) 164/98  Pulse: (!) 103 96  Resp: 17 18  Temp: 37.4 C 36.9 C  SpO2: 100% 100%    Last Pain:  Vitals:   04/10/17 0946  TempSrc:   PainSc: 6       Patients Stated Pain Goal: 3 (34/19/62 2297)  Complications: No apparent anesthesia complications

## 2017-04-11 ENCOUNTER — Encounter (HOSPITAL_COMMUNITY): Payer: Self-pay | Admitting: Vascular Surgery

## 2017-04-11 LAB — RENAL FUNCTION PANEL
ALBUMIN: 2.8 g/dL — AB (ref 3.5–5.0)
Anion gap: 12 (ref 5–15)
BUN: 38 mg/dL — AB (ref 6–20)
CALCIUM: 8.2 mg/dL — AB (ref 8.9–10.3)
CO2: 23 mmol/L (ref 22–32)
CREATININE: 12.17 mg/dL — AB (ref 0.61–1.24)
Chloride: 101 mmol/L (ref 101–111)
GFR calc Af Amer: 6 mL/min — ABNORMAL LOW (ref >60)
GFR calc non Af Amer: 5 mL/min — ABNORMAL LOW (ref >60)
GLUCOSE: 115 mg/dL — AB (ref 65–99)
PHOSPHORUS: 5.2 mg/dL — AB (ref 2.5–4.6)
Potassium: 4.6 mmol/L (ref 3.5–5.1)
SODIUM: 136 mmol/L (ref 135–145)

## 2017-04-11 LAB — CBC
HCT: 22.9 % — ABNORMAL LOW (ref 39.0–52.0)
Hemoglobin: 7.4 g/dL — ABNORMAL LOW (ref 13.0–17.0)
MCH: 27.8 pg (ref 26.0–34.0)
MCHC: 32.3 g/dL (ref 30.0–36.0)
MCV: 86.1 fL (ref 78.0–100.0)
Platelets: 194 10*3/uL (ref 150–400)
RBC: 2.66 MIL/uL — ABNORMAL LOW (ref 4.22–5.81)
RDW: 17.5 % — AB (ref 11.5–15.5)
WBC: 8.5 10*3/uL (ref 4.0–10.5)

## 2017-04-11 LAB — GLUCOSE, CAPILLARY: GLUCOSE-CAPILLARY: 113 mg/dL — AB (ref 65–99)

## 2017-04-11 MED ORDER — CALCITRIOL 0.5 MCG PO CAPS
ORAL_CAPSULE | ORAL | Status: AC
  Filled 2017-04-11: qty 2

## 2017-04-11 MED ORDER — DARBEPOETIN ALFA 200 MCG/0.4ML IJ SOSY
200.0000 ug | PREFILLED_SYRINGE | Freq: Once | INTRAMUSCULAR | Status: AC
  Administered 2017-04-11: 200 ug via INTRAVENOUS

## 2017-04-11 MED ORDER — DARBEPOETIN ALFA 200 MCG/0.4ML IJ SOSY
PREFILLED_SYRINGE | INTRAMUSCULAR | Status: AC
  Filled 2017-04-11: qty 0.4

## 2017-04-11 MED ORDER — SEVELAMER CARBONATE 800 MG PO TABS: 400.0000 mg | ORAL_TABLET | Freq: Three times a day (TID) | ORAL | 1 refills | Status: DC

## 2017-04-11 NOTE — Progress Notes (Signed)
[facility] Rounding Note  Subjective:    Had access placed yesterday by Dr. Bridgett Larsson Arm just a little sore, no steal sx No nausea or vomiting Getting HD today and then home Has outpt HD spot at [facility] on MWF schedule 2nd shift   Objective Vital signs in last 24 hours: Vitals:   04/10/17 1450 04/10/17 1746 04/10/17 2032 04/11/17 0615  BP: (!) 170/104 (!) 155/92 (!) 155/109 (!) 145/89  Pulse: 84 99 96 94  Resp: 18 18 20 20   Temp: 97.7 F (36.5 C) 97.9 F (36.6 C) 98.5 F (36.9 C) 98.8 F (37.1 C)  TempSrc: Oral Oral Oral Oral  SpO2: 100% 100% 100% 98%  Weight:      Height:       Weight change: 0 kg (0 lb)  Intake/Output Summary (Last 24 hours) at 04/11/17 0750 Last data filed at 04/10/17 2239  Gross per 24 hour  Intake             1420 ml  Output               20 ml  Net             1400 ml    Physical Exam: Pt in NAD VS as noted Regular rhythm S1S2 No S3 Lungs clear Abdomen is obese, protuberant, slightly tender No LE edema Dialysis Access: R IJ [facility] 8/9 (8/2 vas cath exchanged) L BC AVF incision clean and dry, good bruit and thrill, hand warm, radial pulse palpable (8/9)   Recent Labs Lab 04/08/17 0550 04/09/17 0448 04/10/17 0404 04/11/17 0416  NA 135 137 138 136  K 3.4* 3.9 3.9 4.6  CL 99* 102 100* 101  CO2 25 23 23 23   GLUCOSE 90 89 89 115*  BUN 37* 16 24* 38*  CREATININE 11.22* 7.12* 10.04* 12.17*  CALCIUM 8.2* 8.7* 8.5* 8.2*  PHOS 4.3 3.2  --  5.2*    Recent Labs Lab 04/08/17 0550 04/09/17 0448 04/11/17 0416  ALBUMIN 2.8* 2.9* 2.8*    Recent Labs Lab 04/07/17 0830  04/08/17 0550 04/09/17 0448 04/10/17 0404 04/11/17 0416  WBC 7.1  --  9.2 8.2 9.5 8.5  HGB 6.0*  < > 7.7* 7.7* 7.1* 7.4*  HCT 17.3*  < > 23.2* 23.0* 21.5* 22.9*  MCV 79.0  --  81.4 84.2 84.6 86.1  PLT 104*  --  116* 146* 168 194  < > = values in this interval not displayed.  Recent Labs Lab 04/05/17 0149  TROPONINI 0.10*    Recent Labs Lab 04/10/17 0739 04/10/17 0906  04/10/17 1449 04/10/17 1703 04/10/17 2037  GLUCAP 85 78 76 140* 138*   Lab Results  Component Value Date   PTH 1,495 (H) 03/03/2017   CALCIUM 8.2 (L) 04/11/2017   PHOS 5.2 (H) 04/11/2017    Scheduled Medications: . amLODipine  2.5 mg Oral QHS  . calcitRIOL  1 mcg Oral Q M,W,F-HD  . Chlorhexidine Gluconate Cloth  6 each Topical Q0600  . darbepoetin (ARANESP) injection - DIALYSIS  200 mcg Intravenous Q Thu-HD  . darbepoetin (ARANESP) injection - DIALYSIS  200 mcg Intravenous Once  . feeding supplement (NEPRO CARB STEADY)  237 mL Oral TID BM  . insulin aspart  0-9 Units Subcutaneous TID WC  . labetalol  200 mg Oral BID  . multivitamin  1 tablet Oral QHS  . pantoprazole  40 mg Oral BID AC  . sevelamer carbonate  400 mg Oral TID WC  . triamcinolone cream  1 application Topical BID    Assessment/Plan:  1. ESRD: HD 8/2, 8/3, 8/4, 8/6, 8/7, 8/10 1. S/p L BC AVF and TDC 8/9 2. CLIPPED to [facility] MWF 2nd shift.  3. No heparin with HD for at least 2 more weeks and if Hb stable out then tight.  4. HD today to get onto outpt schedule. 5. Post HD weight today will be EDW for starters 6. Pt instructed on catheter care, off limits left arm 2. Acute UGI bleed 2/2 M-W tear.  1. s/p total of 8 units of blood since presentation (last 2 units were on 8/6, Hb 7.4 today)  2. No further active bleeding at this time 3. Avoiding heparin with HD according to time frame outlined in #1 3. Hypertension - currently amlodipine and labetolol.  4. Anemia of ESRD/ABLA:  1. Started aranesp 200 QThursday  in addition to PRBCs.   2. Rx'ing ferrlecit 250 IV QD X 4 doses (1st dose was 8/7 and has received 3 doses - give the 4th in HD today)  5. CKD-MBD - PTH 1495, phos 7/2 was 10.2- down to 4.9 with just HD.  1. Added binder (Renvela). WILL need script for this  2. Changed to 1 mcg on MWF with HD (will be given with HD at outpt unit - does not need script for this at discharge).  6. Nutrition: will need RD c/s  on low phos diet   Jamal Maes, MD [place] Pager 04/11/2017, 7:50 AM

## 2017-04-11 NOTE — Progress Notes (Signed)
Patient discharged to home with family. All scripts reviewed. All discharge instructions reviewed. IV removed. All belongings with patient. Patient left unit in stable condition via wheelchair.   Sheliah Plane RN

## 2017-04-11 NOTE — Progress Notes (Signed)
Subjective:  No acute events overnight. Doing well after AVF and TDC yesterday. Arm a little sore. HD today. Tolerating food well, no N/V. No melena or hematemesis. Hb stable at 7.4 (from 7.1 yesterday).  Objective:  Vital signs in last 24 hours: Vitals:   04/10/17 1450 04/10/17 1746 04/10/17 2032 04/11/17 0615  BP: (!) 170/104 (!) 155/92 (!) 155/109 (!) 145/89  Pulse: 84 99 96 94  Resp: 18 18 20 20   Temp: 97.7 F (36.5 C) 97.9 F (36.6 C) 98.5 F (36.9 C) 98.8 F (37.1 C)  TempSrc: Oral Oral Oral Oral  SpO2: 100% 100% 100% 98%  Weight:      Height:       General: alert and oriented. Sitting comfortably in HD bed in NAD. Lungs: No increased work of breathing. CTAB. No wheezes Heart: NR & RR, no m/r/g Abdomen: soft, non-tender, non-distended, normal bowel sounds Skin: R IJ TDC in place. L AVF  CBC    Component Value Date/Time   WBC 8.5 04/11/2017 0416   RBC 2.66 (L) 04/11/2017 0416   HGB 7.4 (L) 04/11/2017 0416   HGB 7.3 (L) 03/18/2017 0942   HCT 22.9 (L) 04/11/2017 0416   HCT 21.8 (L) 03/18/2017 0942   PLT 194 04/11/2017 0416   PLT 143 (L) 03/18/2017 0942   MCV 86.1 04/11/2017 0416   MCV 81 03/18/2017 0942   MCH 27.8 04/11/2017 0416   MCHC 32.3 04/11/2017 0416   RDW 17.5 (H) 04/11/2017 0416   RDW 17.0 (H) 03/18/2017 0942   LYMPHSABS 1.0 12/21/2013 0510   MONOABS 0.1 12/21/2013 0510   EOSABS 0.0 12/21/2013 0510   BASOSABS 0.0 12/21/2013 0510   BMET    Component Value Date/Time   NA 136 04/11/2017 0416   NA 134 03/18/2017 0942   K 4.6 04/11/2017 0416   CL 101 04/11/2017 0416   CO2 23 04/11/2017 0416   GLUCOSE 115 (H) 04/11/2017 0416   BUN 38 (H) 04/11/2017 0416   BUN 135 (HH) 03/18/2017 0942   CREATININE 12.17 (H) 04/11/2017 0416   CREATININE 3.16 (H) 01/05/2014 1459   CALCIUM 8.2 (L) 04/11/2017 0416   CALCIUM 7.4 (L) 03/01/2017 0752   GFRNONAA 5 (L) 04/11/2017 0416   GFRAA 6 (L) 04/11/2017 0416   Assessment/Plan:  Mallory-Weiss Tear: Last  episode of hematemesis 8/5. Stools no larger dark and tarry. Hb today 7.4 (7.1 yesterday). Last transfusion 8/6. (Has received 8 units total). Mallory-Weiss tear likely mostly healed. - daily CBCs, transfuse with HD if Hb <7.3 - PO Protonix BID - Renal diet  ESRD Stage 5 with uremia > now on dialysis, s/p L AVF and [facility] 8/9 - HD today - Nephrology following, appreciate recs --- Patient has a spot at [facility] for outpt dialysis MWF --- Renvela script and calcitriol outpt with HD - Renal diet - d/c today - f/u with Vascular (Dr. Bridgett Larsson) in 4-6 weeks  HTN: BP 145/89. Treating conservatively. - Continue amlodipine 2.5mg  qday & labetalol 200 BID  Anemia of Chronic Disease: Ferrlecit x4 doses and Aranesp. Now s/p 8 pRBC transfusions total. (Last transfusion 8/6) - f/u CBC, transfuse if Hb<7.3 with hemodialysis  Right heel pain, early plantar fasciitis vs pain with flat feet - Encouraged patient to do foot stretches and roll his foot on a cylindrical object before standing up in the morning - Continue to monitor  Situational Depression: Denies SI or HI. Beck Depression Inventory at 97, consistent with moderate depression. I believe that this is an  acute response to his situation. I will not put him on medication for now and will continue to monitor. - Continue to monitor  Dispo: discharge to home today  Colbert Ewing, MD Internal Medicine, PGY-1 04/11/2017, 6:44 AM Pager: [phone]

## 2017-04-11 NOTE — Procedures (Signed)
I have personally attended this patient's dialysis session.   Using [facility] placed yesterday 2K bath pending labs No heparin  Jamal Maes, MD [place] Pager 04/11/2017, 8:02 AM

## 2017-04-11 NOTE — Progress Notes (Signed)
   Daily Progress Note   Assessment/Planning:   POD #1 s/p RIJV TDC, L BC AVF   No signs or sx of steal  F/U in 6-8 weeks (my office will contact patient with appointment)   Subjective  - 1 Day Post-Op   No complaints   Objective   Vitals:   04/10/17 1450 04/10/17 1746 04/10/17 2032 04/11/17 0615  BP: (!) 170/104 (!) 155/92 (!) 155/109 (!) 145/89  Pulse: 84 99 96 94  Resp: 18 18 20 20   Temp: 97.7 F (36.5 C) 97.9 F (36.6 C) 98.5 F (36.9 C) 98.8 F (37.1 C)  TempSrc: Oral Oral Oral Oral  SpO2: 100% 100% 100% 98%  Weight:      Height:         Intake/Output Summary (Last 24 hours) at 04/11/17 0813 Last data filed at 04/10/17 2239  Gross per 24 hour  Intake             1420 ml  Output               20 ml  Net             1400 ml    VASC L antecubital Inc c/d/i, RIJV TDC without blood on bandages, +thrill  NEURO DNVI, hand grip 5/5/    Laboratory   CBC CBC Latest Ref Rng & Units 04/11/2017 04/10/2017 04/09/2017  WBC 4.0 - 10.5 K/uL 8.5 9.5 8.2  Hemoglobin 13.0 - 17.0 g/dL 7.4(L) 7.1(L) 7.7(L)  Hematocrit 39.0 - 52.0 % 22.9(L) 21.5(L) 23.0(L)  Platelets 150 - 400 K/uL 194 168 146(L)    BMET    Component Value Date/Time   NA 136 04/11/2017 0416   NA 134 03/18/2017 0942   K 4.6 04/11/2017 0416   CL 101 04/11/2017 0416   CO2 23 04/11/2017 0416   GLUCOSE 115 (H) 04/11/2017 0416   BUN 38 (H) 04/11/2017 0416   BUN 135 (HH) 03/18/2017 0942   CREATININE 12.17 (H) 04/11/2017 0416   CREATININE 3.16 (H) 01/05/2014 1459   CALCIUM 8.2 (L) 04/11/2017 0416   CALCIUM 7.4 (L) 03/01/2017 0752   GFRNONAA 5 (L) 04/11/2017 0416   GFRAA 6 (L) 04/11/2017 0416     Adele Barthel, MD, FACS [facility] [place] Office: [phone] Pager: [phone]  04/11/2017, 8:13 AM

## 2017-04-14 MED FILL — TRUEplus LANCETS 28G MISC: 25 days supply | Qty: 100 | Fill #1

## 2017-04-14 MED FILL — AMLODIPINE BESYLATE 5 MG TA: 5 | 30 days supply | Qty: 15 | Fill #1

## 2017-04-14 MED FILL — TRUE METRIX TEST STRIP: 25 days supply | Qty: 100 | Fill #1

## 2017-04-14 MED FILL — TRIAMCINOLONE 0.1% CREAM: 0.1 | 30 days supply | Qty: 80 | Fill #1

## 2017-04-14 MED FILL — SEVELAMER CARBONATE 800 MG: 800 | 60 days supply | Qty: 90 | Fill #0

## 2017-05-13 NOTE — Progress Notes (Deleted)
    Postoperative Access Visit   History of Present Illness   Patrick Ibarra is a 36 y.o. year old male who presents for postoperative follow-up for: left brachiocephalic arteriovenous fistula, right internal jugular vein tunneled dialysis catheter  (Date: 04/10/17).  [name] wounds are *** healed.  The patient notes *** steal symptoms.  The patient is *** able to complete their activities of daily living.  [name] current symptoms are: ***.   Physical Examination  ***There were no vitals filed for this visit. ***There is no height or weight on file to calculate BMI.  left arm Incision is *** healed, skin feels ***, hand grip is ***/5, sensation in digits is *** intact, ***palpable thrill, bruit can *** be auscultated     Medical Decision Making   [name] is a 36 y.o. year old male who presents s/p left brachiocephalic arteriovenous fistula, right internal jugular vein tunneled dialysis catheter    [name] access is *** ready for use.  ***[name] tunneled dialysis catheter can be removed after two successful cannulations and completed dialysis treatments.  Thank you for allowing [place] to participate in this patient's care.   Adele Barthel, MD, FACS [facility] Office: [phone] Pager: [phone]

## 2017-05-14 MED FILL — AMLODIPINE BESYLATE 5 MG TA: 5 | 30 days supply | Qty: 15 | Fill #2

## 2017-05-15 NOTE — Progress Notes (Signed)
    Postoperative Access Visit   History of Present Illness   Patrick Ibarra is a 36 y.o. year old male who presents for postoperative follow-up for: left brachiocephalic arteriovenous fistula (Date: 04/10/17).  [name] wounds are healed.  The patient notes mp steal symptoms.  The patient is able to complete their activities of daily living.  [name] current symptoms are: none.   Physical Examination   Vitals:   05/16/17 0913  BP: (!) 186/110  Pulse: (!) 115  Temp: 98 F (36.7 C)  TempSrc: Oral  SpO2: 99%  Weight: 221 lb (100.2 kg)   Body mass index is 29.16 kg/m.  left arm Incision is healed, skin feels warm, hand grip is 5/5, sensation in digits is intact, palpable thrill, bruit can be auscultated     Medical Decision Making   Patrick Ibarra is a 36 y.o. year old male who presents s/p left brachiocephalic arteriovenous fistula   [name] access is ready for use.  [name] tunneled dialysis catheter can be removed after two successful cannulations and completed dialysis treatments.  Thank you for allowing [place] to participate in this patient's care.   Adele Barthel, MD, FACS [facility] Office: [phone] Pager: [phone]

## 2017-05-16 ENCOUNTER — Encounter: Payer: Self-pay | Admitting: Vascular Surgery

## 2017-05-16 ENCOUNTER — Ambulatory Visit (INDEPENDENT_AMBULATORY_CARE_PROVIDER_SITE_OTHER): Payer: Self-pay | Admitting: Vascular Surgery

## 2017-05-16 VITALS — BP 186/110 | HR 115 | Temp 98.0°F | Wt 221.0 lb

## 2017-05-16 DIAGNOSIS — N186 End stage renal disease: Secondary | ICD-10-CM

## 2017-05-30 ENCOUNTER — Ambulatory Visit (HOSPITAL_COMMUNITY)
Admission: RE | Admit: 2017-05-30 | Discharge: 2017-05-30 | Disposition: A | Discharge status: Home / Self Care | Payer: Medicaid Other | Source: Ambulatory Visit | Attending: Internal Medicine | Admitting: Internal Medicine

## 2017-05-30 ENCOUNTER — Encounter: Payer: Self-pay | Admitting: Internal Medicine

## 2017-05-30 ENCOUNTER — Ambulatory Visit: Payer: Medicaid Other | Attending: Internal Medicine | Admitting: Internal Medicine

## 2017-05-30 VITALS — BP 166/123 | HR 109 | Temp 98.1°F | Resp 16 | Wt 224.4 lb

## 2017-05-30 DIAGNOSIS — L602 Onychogryphosis: Secondary | ICD-10-CM

## 2017-05-30 DIAGNOSIS — I509 Heart failure, unspecified: Secondary | ICD-10-CM | POA: Insufficient documentation

## 2017-05-30 DIAGNOSIS — I132 Hypertensive heart and chronic kidney disease with heart failure and with stage 5 chronic kidney disease, or end stage renal disease: Secondary | ICD-10-CM | POA: Insufficient documentation

## 2017-05-30 DIAGNOSIS — I10 Essential (primary) hypertension: Secondary | ICD-10-CM

## 2017-05-30 DIAGNOSIS — E669 Obesity, unspecified: Secondary | ICD-10-CM | POA: Insufficient documentation

## 2017-05-30 DIAGNOSIS — D638 Anemia in other chronic diseases classified elsewhere: Secondary | ICD-10-CM

## 2017-05-30 DIAGNOSIS — Z23 Encounter for immunization: Secondary | ICD-10-CM | POA: Diagnosis not present

## 2017-05-30 DIAGNOSIS — E1122 Type 2 diabetes mellitus with diabetic chronic kidney disease: Secondary | ICD-10-CM | POA: Diagnosis not present

## 2017-05-30 DIAGNOSIS — Z992 Dependence on renal dialysis: Secondary | ICD-10-CM | POA: Diagnosis not present

## 2017-05-30 DIAGNOSIS — G8929 Other chronic pain: Secondary | ICD-10-CM

## 2017-05-30 DIAGNOSIS — Z79899 Other long term (current) drug therapy: Secondary | ICD-10-CM | POA: Insufficient documentation

## 2017-05-30 DIAGNOSIS — N186 End stage renal disease: Secondary | ICD-10-CM | POA: Diagnosis present

## 2017-05-30 DIAGNOSIS — M79671 Pain in right foot: Secondary | ICD-10-CM

## 2017-05-30 DIAGNOSIS — Z6829 Body mass index (BMI) 29.0-29.9, adult: Secondary | ICD-10-CM | POA: Diagnosis not present

## 2017-05-30 DIAGNOSIS — M2011 Hallux valgus (acquired), right foot: Secondary | ICD-10-CM | POA: Insufficient documentation

## 2017-05-30 DIAGNOSIS — D631 Anemia in chronic kidney disease: Secondary | ICD-10-CM | POA: Insufficient documentation

## 2017-05-30 DIAGNOSIS — E119 Type 2 diabetes mellitus without complications: Secondary | ICD-10-CM | POA: Diagnosis not present

## 2017-05-30 MED ORDER — AMLODIPINE BESYLATE 10 MG PO TABS: 10.0000 mg | ORAL_TABLET | Freq: Every day | ORAL | 11 refills | Status: DC

## 2017-05-30 MED ORDER — SEVELAMER CARBONATE 800 MG PO TABS: 400.0000 mg | ORAL_TABLET | Freq: Three times a day (TID) | ORAL | 6 refills | Status: DC

## 2017-05-30 MED FILL — AMLODIPINE BESYLATE 10 MG T: 10 | 30 days supply | Qty: 30 | Fill #0

## 2017-05-30 NOTE — Progress Notes (Signed)
Patient is suppose to take a whole tablet of Sevelamer Carbonate 800mg  and amplodipine 5mg 

## 2017-05-30 NOTE — Patient Instructions (Addendum)
Increase amlodipine to 10 mg daily. You've been referred to a podiatrist and ophthalmologist.  Please go to [facility] for x-ray of your right heel.  Do heel exercises as discussed.  Pneumococcal Polysaccharide Vaccine: What You Need to Know 1. Why get vaccinated? Vaccination can protect older adults (and some children and younger adults) from pneumococcal disease. Pneumococcal disease is caused by bacteria that can spread from person to person through close contact. It can cause ear infections, and it can also lead to more serious infections of the:  Lungs (pneumonia),  Blood (bacteremia), and  Covering of the brain and spinal cord (meningitis). Meningitis can cause deafness and brain damage, and it can be fatal.  Anyone can get pneumococcal disease, but children under 63 years of age, people with certain medical conditions, adults over 54 years of age, and cigarette smokers are at the highest risk. About 18,000 older adults die each year from pneumococcal disease in the [place]. Treatment of pneumococcal infections with penicillin and other drugs used to be more effective. But some strains of the disease have become resistant to these drugs. This makes prevention of the disease, through vaccination, even more important. 2. Pneumococcal polysaccharide vaccine (PPSV23) Pneumococcal polysaccharide vaccine (PPSV23) protects against 23 types of pneumococcal bacteria. It will not prevent all pneumococcal disease. PPSV23 is recommended for:  All adults 35 years of age and older,  Anyone 2 through 34 years of age with certain long-term health problems,  Anyone 2 through 34 years of age with a weakened immune system,  Adults 17 through 34 years of age who smoke cigarettes or have asthma.  Most people need only one dose of PPSV. A second dose is recommended for certain high-risk groups. People 14 and older should get a dose even if they have gotten one or more doses of the vaccine  before they turned 65. Your healthcare provider can give you more information about these recommendations. Most healthy adults develop protection within 2 to 3 weeks of getting the shot. 3. Some people should not get this vaccine  Anyone who has had a life-threatening allergic reaction to PPSV should not get another dose.  Anyone who has a severe allergy to any component of PPSV should not receive it. Tell your provider if you have any severe allergies.  Anyone who is moderately or severely ill when the shot is scheduled may be asked to wait until they recover before getting the vaccine. Someone with a mild illness can usually be vaccinated.  Children less than 42 years of age should not receive this vaccine.  There is no evidence that PPSV is harmful to either a pregnant woman or to her fetus. However, as a precaution, women who need the vaccine should be vaccinated before becoming pregnant, if possible. 4. Risks of a vaccine reaction With any medicine, including vaccines, there is a chance of side effects. These are usually mild and go away on their own, but serious reactions are also possible. About half of people who get PPSV have mild side effects, such as redness or pain where the shot is given, which go away within about two days. Less than 1 out of 100 people develop a fever, muscle aches, or more severe local reactions. Problems that could happen after any vaccine:  People sometimes faint after a medical procedure, including vaccination. Sitting or lying down for about 15 minutes can help prevent fainting, and injuries caused by a fall. Tell your doctor if you feel dizzy, or have  vision changes or ringing in the ears.  Some people get severe pain in the shoulder and have difficulty moving the arm where a shot was given. This happens very rarely.  Any medication can cause a severe allergic reaction. Such reactions from a vaccine are very rare, estimated at about 1 in a million doses,  and would happen within a few minutes to a few hours after the vaccination. As with any medicine, there is a very remote chance of a vaccine causing a serious injury or death. The safety of vaccines is always being monitored. For more information, visit: http://www.aguilar.org/ 5. What if there is a serious reaction? What should I look for? Look for anything that concerns you, such as signs of a severe allergic reaction, very high fever, or unusual behavior. Signs of a severe allergic reaction can include hives, swelling of the face and throat, difficulty breathing, a fast heartbeat, dizziness, and weakness. These would usually start a few minutes to a few hours after the vaccination. What should I do? If you think it is a severe allergic reaction or other emergency that can't wait, call 9-1-1 or get to the nearest hospital. Otherwise, call your doctor. Afterward, the reaction should be reported to the Vaccine Adverse Event Reporting System (VAERS). Your doctor might file this report, or you can do it yourself through the VAERS web site at www.vaers.SamedayNews.es, or by calling (605) 429-7714. VAERS does not give medical advice. 6. How can I learn more?  Ask your doctor. He or she can give you the vaccine package insert or suggest other sources of information.  Call your local or state health department.  Contact the Centers for Disease Control and Prevention (CDC): ? Call (601) 362-1197 (1-800-CDC-INFO) or ? Visit CDC's website at http://hunter.com/ CDC Pneumococcal Polysaccharide Vaccine VIS (12/24/13) This information is not intended to replace advice given to you by your health care provider. Make sure you discuss any questions you have with your health care provider. Document Released: 06/16/2006 Document Revised: 05/09/2016 Document Reviewed: 05/09/2016 Elsevier Interactive Patient Education  2017 [place] (Tetanus and Diphtheria): What You Need to Know 1. Why get  vaccinated? Tetanus  and diphtheria are very serious diseases. They are rare in the [place] today, but people who do become infected often have severe complications. Td vaccine is used to protect adolescents and adults from both of these diseases. Both tetanus and diphtheria are infections caused by bacteria. Diphtheria spreads from person to person through coughing or sneezing. Tetanus-causing bacteria enter the body through cuts, scratches, or wounds. TETANUS (lockjaw) causes painful muscle tightening and stiffness, usually all over the body.  It can lead to tightening of muscles in the head and neck so you can't open your mouth, swallow, or sometimes even breathe. Tetanus kills about 1 out of every 10 people who are infected even after receiving the best medical care.  DIPHTHERIA can cause a thick coating to form in the back of the throat.  It can lead to breathing problems, paralysis, heart failure, and death.  Before vaccines, as many as 200,000 cases of diphtheria and hundreds of cases of tetanus were reported in the [place] each year. Since vaccination began, reports of cases for both diseases have dropped by about 99%. 2. Td vaccine Td vaccine can protect adolescents and adults from tetanus and diphtheria. Td is usually given as a booster dose every 10 years but it can also be given earlier after a severe and dirty wound or  burn. Another vaccine, called Tdap, which protects against pertussis in addition to tetanus and diphtheria, is sometimes recommended instead of Td vaccine. Your doctor or the person giving you the vaccine can give you more information. Td may safely be given at the same time as other vaccines. 3. Some people should not get this vaccine  A person who has ever had a life-threatening allergic reaction after a previous dose of any tetanus or diphtheria containing vaccine, OR has a severe allergy to any part of this vaccine, should not get Td vaccine. Tell the  person giving the vaccine about any severe allergies.  Talk to your doctor if you: ? had severe pain or swelling after any vaccine containing diphtheria or tetanus, ? ever had a condition called Guillain Barre Syndrome (GBS), ? aren't feeling well on the day the shot is scheduled. 4. What are the risks from Td vaccine? With any medicine, including vaccines, there is a chance of side effects. These are usually mild and go away on their own. Serious reactions are also possible but are rare. Most people who get Td vaccine do not have any problems with it. Mild problems following Td vaccine: (Did not interfere with activities)  Pain where the shot was given (about 8 people in 10)  Redness or swelling where the shot was given (about 1 person in 4)  Mild fever (rare)  Headache (about 1 person in 4)  Tiredness (about 1 person in 4)  Moderate problems following Td vaccine: (Interfered with activities, but did not require medical attention)  Fever over 102F (rare)  Severe problems following Td vaccine: (Unable to perform usual activities; required medical attention)  Swelling, severe pain, bleeding and/or redness in the arm where the shot was given (rare).  Problems that could happen after any vaccine:  People sometimes faint after a medical procedure, including vaccination. Sitting or lying down for about 15 minutes can help prevent fainting, and injuries caused by a fall. Tell your doctor if you feel dizzy, or have vision changes or ringing in the ears.  Some people get severe pain in the shoulder and have difficulty moving the arm where a shot was given. This happens very rarely.  Any medication can cause a severe allergic reaction. Such reactions from a vaccine are very rare, estimated at fewer than 1 in a million doses, and would happen within a few minutes to a few hours after the vaccination. As with any medicine, there is a very remote chance of a vaccine causing a serious  injury or death. The safety of vaccines is always being monitored. For more information, visit: http://www.aguilar.org/ 5. What if there is a serious reaction? What should I look for? Look for anything that concerns you, such as signs of a severe allergic reaction, very high fever, or unusual behavior. Signs of a severe allergic reaction can include hives, swelling of the face and throat, difficulty breathing, a fast heartbeat, dizziness, and weakness. These would usually start a few minutes to a few hours after the vaccination. What should I do?  If you think it is a severe allergic reaction or other emergency that can't wait, call 9-1-1 or get the person to the nearest hospital. Otherwise, call your doctor.  Afterward, the reaction should be reported to the Vaccine Adverse Event Reporting System (VAERS). Your doctor might file this report, or you can do it yourself through the VAERS web site at www.vaers.SamedayNews.es, or by calling (408)403-5398. ? VAERS does not give medical advice. 6.  The National Vaccine Injury Compensation Program The National Vaccine Injury Compensation Program (VICP) is a federal program that was created to compensate people who may have been injured by certain vaccines. Persons who believe they may have been injured by a vaccine can learn about the program and about filing a claim by calling (534)615-2406 or visiting the [place] website at GoldCloset.com.ee. There is a time limit to file a claim for compensation. 7. How can I learn more?  Ask your doctor. He or she can give you the vaccine package insert or suggest other sources of information.  Call your local or state health department.  Contact the Centers for Disease Control and Prevention (CDC): ? Call (781)138-9540 (1-800-CDC-INFO) ? Visit CDC's website at http://hunter.com/ CDC Td Vaccine VIS (12/12/15) This information is not intended to replace advice given to you by your health care provider.  Make sure you discuss any questions you have with your health care provider. Document Released: 06/16/2006 Document Revised: 05/09/2016 Document Reviewed: 05/09/2016 Elsevier Interactive Patient Education  2017 [employer].

## 2017-05-30 NOTE — Progress Notes (Signed)
Patient ID: Patrick Ibarra, male    DOB: Aug 05, 1981  MRN: [id number]  CC: Follow-up   Subjective: Patrick Ibarra is a 36 y.o. male who presents for hospital follow-up and chronic disease management. Wife is with him His concerns today include:  Patient with history of HTN, end-stage renal disease on HD, DM type II diet controlled, CHF EF 60-65%.  Since last visit with me patient was hospitalized 8/2-06/2017 and started on hemodialysis. He also had anemia associated with Mallory Weiss tear and required transfusion of 8 units PRBC. He has since had placement of HD graft in the left upper extremity.  1. ESRD: on HD M/W/F through tunnel cath. -more energy since transfusions.  -no GERD symptoms. No black stools.  -he has applied for disability and was approved for Medicaid. Should get Medicare in a few mths. -told to d/c PO iron as he gets it PRN IV at HD. States he was also told to stop Sod. Bicarb. Purchased TUMS OTC.  - 2. HTN: -taking only Norvasc 5 mg. Out of Labetalol since hosp dischg -checks BP at home few times a wk.  SBP has been in 150-160s. -no CP/SOB/LE edema.  3. DM: checks BS at least 3 x a wk. Range 90-100.  4. RT heel pain x several mths. Worse in a.m when he first gets out of bed. Gets better after a few steps  HM: got flu shot in HD a few wks ago  Patient Active Problem List   Diagnosis Date Noted  . Pancreatitis, acute 04/04/2017  . GI bleed 04/03/2017  . CHF (congestive heart failure) ([place]) 03/01/2017  . Obesity 03/01/2017  . Anemia of chronic kidney failure 03/01/2017  . Essential hypertension 01/05/2014  . Diabetes mellitus ([place]) 12/24/2011  . ESRD (end stage renal disease) ([place]) 12/24/2011     Current Outpatient Prescriptions on File Prior to Visit  Medication Sig Dispense Refill  . Blood Glucose Monitoring Suppl (TRUE METRIX METER) w/Device KIT Use as directed (Patient not taking: Reported on 05/30/2017) 1 kit 0  . calcium carbonate (TUMS) 500 MG  chewable tablet 2 tabs PO with each meal and at bedtime. (Patient not taking: Reported on 04/03/2017) 240 tablet 1  . glucose blood (TRUE METRIX BLOOD GLUCOSE TEST) test strip Use as instructed (Patient not taking: Reported on 05/30/2017) 100 each 12  . triamcinolone cream (KENALOG) 0.1 % Apply 1 application topically 2 (two) times daily. (Patient not taking: Reported on 05/16/2017) 80 g 1  . TRUEPLUS LANCETS 28G MISC Use as directed (Patient not taking: Reported on 05/30/2017) 100 each 1   No current facility-administered medications on file prior to visit.     No Known Allergies  Social History   Social History  . Marital status: Married    Spouse name: N/A  . Number of children: N/A  . Years of education: N/A   Occupational History  . Not on file.   Social History Main Topics  . Smoking status: Never Smoker  . Smokeless tobacco: Never Used  . Alcohol use Yes     Comment: WINE ONCE A WEEK  . Drug use: No  . Sexual activity: Not on file   Other Topics Concern  . Not on file   Social History Narrative   He is married with step kids. He works at [employer].     Family History  Problem Relation Age of Onset  . Hypertension Mother   . Diabetes Mother   . Hypertension Father  Past Surgical History:  Procedure Laterality Date  . AV FISTULA PLACEMENT Left 04/10/2017   Procedure: ARTERIOVENOUS (AV) FISTULA CREATION LEFT ARM;  Surgeon: Conrad Campbell, MD;  Location: [facility];  Service: Vascular;  Laterality: Left;  . ESOPHAGOGASTRODUODENOSCOPY (EGD) WITH PROPOFOL N/A 04/04/2017   Procedure: ESOPHAGOGASTRODUODENOSCOPY (EGD) WITH PROPOFOL;  Surgeon: Carol Ada, MD;  Location: [place];  Service: Endoscopy;  Laterality: N/A;  . INSERTION OF DIALYSIS CATHETER Right 04/10/2017   Procedure: INSERTION OF DIALYSIS CATHETER - RIGHT INTERNAL JUGULAR PLACEMENT;  Surgeon: Conrad Neola, MD;  Location: [facility];  Service: Vascular;  Laterality: Right;  . TONSILLECTOMY AND ADENOIDECTOMY       ROS: Review of Systems Negative except as stated above PHYSICAL EXAM: BP (!) 166/123   Pulse (!) 109   Temp 98.1 F (36.7 C) (Oral)   Resp 16   Wt 224 lb 6.4 oz (101.8 kg)   SpO2 100%   BMI 29.61 kg/m   Wt Readings from Last 3 Encounters:  05/30/17 224 lb 6.4 oz (101.8 kg)  05/16/17 221 lb (100.2 kg)  04/11/17 222 lb 0.1 oz (100.7 kg)    Physical Exam General appearance - alert, well appearing, and in no distress Mental status - alert, oriented to person, place, and time, normal mood, behavior, speech, dress, motor activity, and thought processes Mouth - mucous membranes moist, pharynx normal without lesions Neck - supple, no significant adenopathy Chest - clear to auscultation, no wheezes, rales or rhonchi, symmetric air entry Heart - normal rate, regular rhythm, normal S1, S2, no murmurs, rubs, clicks or gallops Musculoskeletal - no tenderness on palpation of the right heel Extremities -trace lower extremity edema Diabetic Foot Exam - Simple   Simple Foot Form Visual Inspection See comments:  Yes Sensation Testing Intact to touch and monofilament testing bilaterally:  Yes Pulse Check Posterior Tibialis and Dorsalis pulse intact bilaterally:  Yes Comments Flat footed. Toe nails over grown, skin on sole of the feet very dry and peeling     Lab Results  Component Value Date   HGBA1C 5.8 (H) 03/01/2017   ASSESSMENT AND PLAN: 1. Essential hypertension Not at goal. Increase amlodipine to 10 mg. Continue to limit salt He is not taking labetalol. Wife states that it was discontinued - amLODipine (NORVASC) 10 MG tablet; Take 1 tablet (10 mg total) by mouth daily.  Dispense: 30 tablet; Refill: 11  2. ESRD (end stage renal disease) ([facility]) - sevelamer carbonate (RENVELA) 800 MG tablet; Take 0.5 tablets (400 mg total) by mouth 3 (three) times daily with meals.  Dispense: 90 tablet; Refill: 6  3. Anemia of chronic disease -Sounds like he is receiving Aranesp PRN during  hemodialysis  4. Heel pain, chronic, right -Possible heel spur versus plantar fasciitis. Went over heel exercises - DG Foot Complete Right; Future - Ambulatory referral to Podiatry  5. Need for Tdap vaccination - Tdap vaccine greater than or equal to 7yo IM  6. Need for vaccination against Streptococcus pneumoniae - Pneumococcal polysaccharide vaccine 23-valent greater than or equal to 2yo subcutaneous/IM  7. Controlled type 2 diabetes mellitus without complication, without long-term current use of insulin ([place]) - Ambulatory referral to Ophthalmology  8. Overgrown toenails - Ambulatory referral to Podiatry  Patient was given the opportunity to ask questions.  Patient verbalized understanding of the plan and was able to repeat key elements of the plan.   Orders Placed This Encounter  Procedures  . DG Foot Complete Right  . Tdap vaccine greater  than or equal to 7yo IM  . Pneumococcal polysaccharide vaccine 23-valent greater than or equal to 2yo subcutaneous/IM  . Ambulatory referral to Ophthalmology  . Ambulatory referral to Podiatry     Requested Prescriptions   Signed Prescriptions Disp Refills  . sevelamer carbonate (RENVELA) 800 MG tablet 90 tablet 6    Sig: Take 0.5 tablets (400 mg total) by mouth 3 (three) times daily with meals.  Marland Kitchen amLODipine (NORVASC) 10 MG tablet 30 tablet 11    Sig: Take 1 tablet (10 mg total) by mouth daily.    Return in about 3 months (around 08/29/2017).  Karle Plumber, MD, FACP

## 2017-06-02 ENCOUNTER — Telehealth: Payer: Self-pay | Admitting: Internal Medicine

## 2017-06-02 DIAGNOSIS — N186 End stage renal disease: Secondary | ICD-10-CM

## 2017-06-02 NOTE — Telephone Encounter (Signed)
Pt called requesting to speak to PCP , regarding medication sevelamer carbonate (RENVELA) 800 MG tablet . Please f/up

## 2017-06-04 NOTE — Telephone Encounter (Signed)
Will forward to pcp

## 2017-06-05 ENCOUNTER — Telehealth: Payer: Self-pay

## 2017-06-05 MED ORDER — SEVELAMER CARBONATE 800 MG PO TABS: 800.0000 mg | ORAL_TABLET | Freq: Three times a day (TID) | ORAL | 6 refills | Status: DC

## 2017-06-05 MED FILL — SEVELAMER CARBONATE 800 MG: 800 | 30 days supply | Qty: 90 | Fill #0

## 2017-06-05 NOTE — Telephone Encounter (Signed)
Contacted pt wife and spoke with her regarding patient xray. Pt wife is aware and doesn't have any questions or concerns

## 2017-06-05 NOTE — Telephone Encounter (Signed)
Spoke with Neysa Bonito one of our pharmacist. She spoke with pt's wife who indicated that nephrologist wants pt to take Sevelamer 800 mg whole tab TID with meals. He is currently out. New rxn sent to pharmacy reflecting inc dose.

## 2017-06-09 ENCOUNTER — Telehealth: Payer: Self-pay | Admitting: Internal Medicine

## 2017-06-09 MED ORDER — CALCIUM ACETATE 667 MG PO CAPS: 667.0000 mg | ORAL_CAPSULE | Freq: Three times a day (TID) | ORAL | 5 refills | Status: DC

## 2017-06-09 NOTE — Telephone Encounter (Signed)
Phone call placed to patient today. I left message on his voicemail informing him that I was calling and I needed him to call me back. He can call and request to speak with my nurse. If patient calls back please advise him that we do not carry the medication Sevelamer. Tell him to ask his kidney doctor at dialysis if we can substitute it with Calcium Acetate and if so what dose and how often to take.

## 2017-06-09 NOTE — Telephone Encounter (Signed)
Contacted pt kidney doctor at dialysis and spoke with Elberta Fortis the nurse. Per Elberta Fortis pt med list was updated on 05-23-17 and they do not have the medication Sevelamer on med list. Please f/u

## 2017-06-09 NOTE — Telephone Encounter (Signed)
PC placed to pt this evening. I left a message again informing of who I am and that I need to speak with him. Asked that he contact me at the office.

## 2017-07-04 DIAGNOSIS — E1129 Type 2 diabetes mellitus with other diabetic kidney complication: Secondary | ICD-10-CM | POA: Diagnosis not present

## 2017-07-04 DIAGNOSIS — D509 Iron deficiency anemia, unspecified: Secondary | ICD-10-CM | POA: Diagnosis not present

## 2017-07-04 DIAGNOSIS — D631 Anemia in chronic kidney disease: Secondary | ICD-10-CM | POA: Diagnosis not present

## 2017-07-04 DIAGNOSIS — N2581 Secondary hyperparathyroidism of renal origin: Secondary | ICD-10-CM | POA: Diagnosis not present

## 2017-07-04 DIAGNOSIS — N186 End stage renal disease: Secondary | ICD-10-CM | POA: Diagnosis not present

## 2017-07-04 DIAGNOSIS — Z23 Encounter for immunization: Secondary | ICD-10-CM | POA: Diagnosis not present

## 2017-07-04 MED FILL — SEVELAMER CARBONATE 800 MG: 800 | 30 days supply | Qty: 90 | Fill #1

## 2017-07-04 MED FILL — AMLODIPINE BESYLATE 10 MG T: 10 | 30 days supply | Qty: 30 | Fill #1

## 2017-07-07 DIAGNOSIS — N186 End stage renal disease: Secondary | ICD-10-CM | POA: Diagnosis not present

## 2017-07-07 DIAGNOSIS — N2581 Secondary hyperparathyroidism of renal origin: Secondary | ICD-10-CM | POA: Diagnosis not present

## 2017-07-07 DIAGNOSIS — E1129 Type 2 diabetes mellitus with other diabetic kidney complication: Secondary | ICD-10-CM | POA: Diagnosis not present

## 2017-07-07 DIAGNOSIS — D509 Iron deficiency anemia, unspecified: Secondary | ICD-10-CM | POA: Diagnosis not present

## 2017-07-07 DIAGNOSIS — D631 Anemia in chronic kidney disease: Secondary | ICD-10-CM | POA: Diagnosis not present

## 2017-07-07 DIAGNOSIS — Z23 Encounter for immunization: Secondary | ICD-10-CM | POA: Diagnosis not present

## 2017-07-09 DIAGNOSIS — N2581 Secondary hyperparathyroidism of renal origin: Secondary | ICD-10-CM | POA: Diagnosis not present

## 2017-07-09 DIAGNOSIS — D631 Anemia in chronic kidney disease: Secondary | ICD-10-CM | POA: Diagnosis not present

## 2017-07-09 DIAGNOSIS — Z23 Encounter for immunization: Secondary | ICD-10-CM | POA: Diagnosis not present

## 2017-07-09 DIAGNOSIS — E1129 Type 2 diabetes mellitus with other diabetic kidney complication: Secondary | ICD-10-CM | POA: Diagnosis not present

## 2017-07-09 DIAGNOSIS — D509 Iron deficiency anemia, unspecified: Secondary | ICD-10-CM | POA: Diagnosis not present

## 2017-07-09 DIAGNOSIS — N186 End stage renal disease: Secondary | ICD-10-CM | POA: Diagnosis not present

## 2017-07-10 MED FILL — SEVELAMER CARBONATE 800 MG: 800 | 30 days supply | Qty: 390 | Fill #0

## 2017-07-11 DIAGNOSIS — Z23 Encounter for immunization: Secondary | ICD-10-CM | POA: Diagnosis not present

## 2017-07-11 DIAGNOSIS — N2581 Secondary hyperparathyroidism of renal origin: Secondary | ICD-10-CM | POA: Diagnosis not present

## 2017-07-11 DIAGNOSIS — N186 End stage renal disease: Secondary | ICD-10-CM | POA: Diagnosis not present

## 2017-07-11 DIAGNOSIS — D509 Iron deficiency anemia, unspecified: Secondary | ICD-10-CM | POA: Diagnosis not present

## 2017-07-11 DIAGNOSIS — E1129 Type 2 diabetes mellitus with other diabetic kidney complication: Secondary | ICD-10-CM | POA: Diagnosis not present

## 2017-07-11 DIAGNOSIS — D631 Anemia in chronic kidney disease: Secondary | ICD-10-CM | POA: Diagnosis not present

## 2017-07-14 DIAGNOSIS — Z23 Encounter for immunization: Secondary | ICD-10-CM | POA: Diagnosis not present

## 2017-07-14 DIAGNOSIS — N2581 Secondary hyperparathyroidism of renal origin: Secondary | ICD-10-CM | POA: Diagnosis not present

## 2017-07-14 DIAGNOSIS — D631 Anemia in chronic kidney disease: Secondary | ICD-10-CM | POA: Diagnosis not present

## 2017-07-14 DIAGNOSIS — E1129 Type 2 diabetes mellitus with other diabetic kidney complication: Secondary | ICD-10-CM | POA: Diagnosis not present

## 2017-07-14 DIAGNOSIS — N186 End stage renal disease: Secondary | ICD-10-CM | POA: Diagnosis not present

## 2017-07-14 DIAGNOSIS — D509 Iron deficiency anemia, unspecified: Secondary | ICD-10-CM | POA: Diagnosis not present

## 2017-07-16 DIAGNOSIS — D509 Iron deficiency anemia, unspecified: Secondary | ICD-10-CM | POA: Diagnosis not present

## 2017-07-16 DIAGNOSIS — N186 End stage renal disease: Secondary | ICD-10-CM | POA: Diagnosis not present

## 2017-07-16 DIAGNOSIS — N2581 Secondary hyperparathyroidism of renal origin: Secondary | ICD-10-CM | POA: Diagnosis not present

## 2017-07-16 DIAGNOSIS — E1129 Type 2 diabetes mellitus with other diabetic kidney complication: Secondary | ICD-10-CM | POA: Diagnosis not present

## 2017-07-16 DIAGNOSIS — Z23 Encounter for immunization: Secondary | ICD-10-CM | POA: Diagnosis not present

## 2017-07-16 DIAGNOSIS — I10 Essential (primary) hypertension: Secondary | ICD-10-CM | POA: Diagnosis not present

## 2017-07-16 DIAGNOSIS — D631 Anemia in chronic kidney disease: Secondary | ICD-10-CM | POA: Diagnosis not present

## 2017-07-18 DIAGNOSIS — N2581 Secondary hyperparathyroidism of renal origin: Secondary | ICD-10-CM | POA: Diagnosis not present

## 2017-07-18 DIAGNOSIS — D631 Anemia in chronic kidney disease: Secondary | ICD-10-CM | POA: Diagnosis not present

## 2017-07-18 DIAGNOSIS — D509 Iron deficiency anemia, unspecified: Secondary | ICD-10-CM | POA: Diagnosis not present

## 2017-07-18 DIAGNOSIS — Z23 Encounter for immunization: Secondary | ICD-10-CM | POA: Diagnosis not present

## 2017-07-18 DIAGNOSIS — N186 End stage renal disease: Secondary | ICD-10-CM | POA: Diagnosis not present

## 2017-07-18 DIAGNOSIS — E1129 Type 2 diabetes mellitus with other diabetic kidney complication: Secondary | ICD-10-CM | POA: Diagnosis not present

## 2017-07-20 DIAGNOSIS — N186 End stage renal disease: Secondary | ICD-10-CM | POA: Diagnosis not present

## 2017-07-20 DIAGNOSIS — D509 Iron deficiency anemia, unspecified: Secondary | ICD-10-CM | POA: Diagnosis not present

## 2017-07-20 DIAGNOSIS — E1129 Type 2 diabetes mellitus with other diabetic kidney complication: Secondary | ICD-10-CM | POA: Diagnosis not present

## 2017-07-20 DIAGNOSIS — D631 Anemia in chronic kidney disease: Secondary | ICD-10-CM | POA: Diagnosis not present

## 2017-07-20 DIAGNOSIS — Z23 Encounter for immunization: Secondary | ICD-10-CM | POA: Diagnosis not present

## 2017-07-20 DIAGNOSIS — N2581 Secondary hyperparathyroidism of renal origin: Secondary | ICD-10-CM | POA: Diagnosis not present

## 2017-07-22 DIAGNOSIS — E1129 Type 2 diabetes mellitus with other diabetic kidney complication: Secondary | ICD-10-CM | POA: Diagnosis not present

## 2017-07-22 DIAGNOSIS — N2581 Secondary hyperparathyroidism of renal origin: Secondary | ICD-10-CM | POA: Diagnosis not present

## 2017-07-22 DIAGNOSIS — N186 End stage renal disease: Secondary | ICD-10-CM | POA: Diagnosis not present

## 2017-07-22 DIAGNOSIS — D509 Iron deficiency anemia, unspecified: Secondary | ICD-10-CM | POA: Diagnosis not present

## 2017-07-22 DIAGNOSIS — Z23 Encounter for immunization: Secondary | ICD-10-CM | POA: Diagnosis not present

## 2017-07-22 DIAGNOSIS — D631 Anemia in chronic kidney disease: Secondary | ICD-10-CM | POA: Diagnosis not present

## 2017-07-25 DIAGNOSIS — N2581 Secondary hyperparathyroidism of renal origin: Secondary | ICD-10-CM | POA: Diagnosis not present

## 2017-07-25 DIAGNOSIS — E1129 Type 2 diabetes mellitus with other diabetic kidney complication: Secondary | ICD-10-CM | POA: Diagnosis not present

## 2017-07-25 DIAGNOSIS — N186 End stage renal disease: Secondary | ICD-10-CM | POA: Diagnosis not present

## 2017-07-25 DIAGNOSIS — Z23 Encounter for immunization: Secondary | ICD-10-CM | POA: Diagnosis not present

## 2017-07-25 DIAGNOSIS — D509 Iron deficiency anemia, unspecified: Secondary | ICD-10-CM | POA: Diagnosis not present

## 2017-07-25 DIAGNOSIS — D631 Anemia in chronic kidney disease: Secondary | ICD-10-CM | POA: Diagnosis not present

## 2017-07-28 DIAGNOSIS — N2581 Secondary hyperparathyroidism of renal origin: Secondary | ICD-10-CM | POA: Diagnosis not present

## 2017-07-28 DIAGNOSIS — D509 Iron deficiency anemia, unspecified: Secondary | ICD-10-CM | POA: Diagnosis not present

## 2017-07-28 DIAGNOSIS — N186 End stage renal disease: Secondary | ICD-10-CM | POA: Diagnosis not present

## 2017-07-28 DIAGNOSIS — E1129 Type 2 diabetes mellitus with other diabetic kidney complication: Secondary | ICD-10-CM | POA: Diagnosis not present

## 2017-07-28 DIAGNOSIS — D631 Anemia in chronic kidney disease: Secondary | ICD-10-CM | POA: Diagnosis not present

## 2017-07-28 DIAGNOSIS — Z23 Encounter for immunization: Secondary | ICD-10-CM | POA: Diagnosis not present

## 2017-07-30 DIAGNOSIS — N186 End stage renal disease: Secondary | ICD-10-CM | POA: Diagnosis not present

## 2017-07-30 DIAGNOSIS — D631 Anemia in chronic kidney disease: Secondary | ICD-10-CM | POA: Diagnosis not present

## 2017-07-30 DIAGNOSIS — N2581 Secondary hyperparathyroidism of renal origin: Secondary | ICD-10-CM | POA: Diagnosis not present

## 2017-07-30 DIAGNOSIS — D509 Iron deficiency anemia, unspecified: Secondary | ICD-10-CM | POA: Diagnosis not present

## 2017-07-30 DIAGNOSIS — Z23 Encounter for immunization: Secondary | ICD-10-CM | POA: Diagnosis not present

## 2017-07-30 DIAGNOSIS — E1129 Type 2 diabetes mellitus with other diabetic kidney complication: Secondary | ICD-10-CM | POA: Diagnosis not present

## 2017-08-01 DIAGNOSIS — I129 Hypertensive chronic kidney disease with stage 1 through stage 4 chronic kidney disease, or unspecified chronic kidney disease: Secondary | ICD-10-CM | POA: Diagnosis not present

## 2017-08-01 DIAGNOSIS — Z992 Dependence on renal dialysis: Secondary | ICD-10-CM | POA: Diagnosis not present

## 2017-08-01 DIAGNOSIS — N2581 Secondary hyperparathyroidism of renal origin: Secondary | ICD-10-CM | POA: Diagnosis not present

## 2017-08-01 DIAGNOSIS — E1129 Type 2 diabetes mellitus with other diabetic kidney complication: Secondary | ICD-10-CM | POA: Diagnosis not present

## 2017-08-01 DIAGNOSIS — N186 End stage renal disease: Secondary | ICD-10-CM | POA: Diagnosis not present

## 2017-08-01 DIAGNOSIS — D631 Anemia in chronic kidney disease: Secondary | ICD-10-CM | POA: Diagnosis not present

## 2017-08-01 DIAGNOSIS — D509 Iron deficiency anemia, unspecified: Secondary | ICD-10-CM | POA: Diagnosis not present

## 2017-08-01 DIAGNOSIS — Z23 Encounter for immunization: Secondary | ICD-10-CM | POA: Diagnosis not present

## 2017-08-04 DIAGNOSIS — D509 Iron deficiency anemia, unspecified: Secondary | ICD-10-CM | POA: Diagnosis not present

## 2017-08-04 DIAGNOSIS — E1129 Type 2 diabetes mellitus with other diabetic kidney complication: Secondary | ICD-10-CM | POA: Diagnosis not present

## 2017-08-04 DIAGNOSIS — D631 Anemia in chronic kidney disease: Secondary | ICD-10-CM | POA: Diagnosis not present

## 2017-08-04 DIAGNOSIS — N186 End stage renal disease: Secondary | ICD-10-CM | POA: Diagnosis not present

## 2017-08-04 DIAGNOSIS — N2581 Secondary hyperparathyroidism of renal origin: Secondary | ICD-10-CM | POA: Diagnosis not present

## 2017-08-06 DIAGNOSIS — N2581 Secondary hyperparathyroidism of renal origin: Secondary | ICD-10-CM | POA: Diagnosis not present

## 2017-08-06 DIAGNOSIS — N186 End stage renal disease: Secondary | ICD-10-CM | POA: Diagnosis not present

## 2017-08-06 DIAGNOSIS — D509 Iron deficiency anemia, unspecified: Secondary | ICD-10-CM | POA: Diagnosis not present

## 2017-08-06 DIAGNOSIS — E1129 Type 2 diabetes mellitus with other diabetic kidney complication: Secondary | ICD-10-CM | POA: Diagnosis not present

## 2017-08-06 DIAGNOSIS — D631 Anemia in chronic kidney disease: Secondary | ICD-10-CM | POA: Diagnosis not present

## 2017-08-08 DIAGNOSIS — N186 End stage renal disease: Secondary | ICD-10-CM | POA: Diagnosis not present

## 2017-08-08 DIAGNOSIS — D631 Anemia in chronic kidney disease: Secondary | ICD-10-CM | POA: Diagnosis not present

## 2017-08-08 DIAGNOSIS — D509 Iron deficiency anemia, unspecified: Secondary | ICD-10-CM | POA: Diagnosis not present

## 2017-08-08 DIAGNOSIS — E1129 Type 2 diabetes mellitus with other diabetic kidney complication: Secondary | ICD-10-CM | POA: Diagnosis not present

## 2017-08-08 DIAGNOSIS — N2581 Secondary hyperparathyroidism of renal origin: Secondary | ICD-10-CM | POA: Diagnosis not present

## 2017-08-13 DIAGNOSIS — D509 Iron deficiency anemia, unspecified: Secondary | ICD-10-CM | POA: Diagnosis not present

## 2017-08-13 DIAGNOSIS — E1129 Type 2 diabetes mellitus with other diabetic kidney complication: Secondary | ICD-10-CM | POA: Diagnosis not present

## 2017-08-13 DIAGNOSIS — N186 End stage renal disease: Secondary | ICD-10-CM | POA: Diagnosis not present

## 2017-08-13 DIAGNOSIS — D631 Anemia in chronic kidney disease: Secondary | ICD-10-CM | POA: Diagnosis not present

## 2017-08-13 DIAGNOSIS — N2581 Secondary hyperparathyroidism of renal origin: Secondary | ICD-10-CM | POA: Diagnosis not present

## 2017-08-13 DIAGNOSIS — I10 Essential (primary) hypertension: Secondary | ICD-10-CM | POA: Diagnosis not present

## 2017-08-14 MED FILL — AMLODIPINE BESYLATE 10 MG T: 10 | 30 days supply | Qty: 30 | Fill #2

## 2017-08-15 DIAGNOSIS — D509 Iron deficiency anemia, unspecified: Secondary | ICD-10-CM | POA: Diagnosis not present

## 2017-08-15 DIAGNOSIS — D631 Anemia in chronic kidney disease: Secondary | ICD-10-CM | POA: Diagnosis not present

## 2017-08-15 DIAGNOSIS — N2581 Secondary hyperparathyroidism of renal origin: Secondary | ICD-10-CM | POA: Diagnosis not present

## 2017-08-15 DIAGNOSIS — E1129 Type 2 diabetes mellitus with other diabetic kidney complication: Secondary | ICD-10-CM | POA: Diagnosis not present

## 2017-08-15 DIAGNOSIS — N186 End stage renal disease: Secondary | ICD-10-CM | POA: Diagnosis not present

## 2017-08-18 DIAGNOSIS — D631 Anemia in chronic kidney disease: Secondary | ICD-10-CM | POA: Diagnosis not present

## 2017-08-18 DIAGNOSIS — N2581 Secondary hyperparathyroidism of renal origin: Secondary | ICD-10-CM | POA: Diagnosis not present

## 2017-08-18 DIAGNOSIS — N186 End stage renal disease: Secondary | ICD-10-CM | POA: Diagnosis not present

## 2017-08-18 DIAGNOSIS — E1129 Type 2 diabetes mellitus with other diabetic kidney complication: Secondary | ICD-10-CM | POA: Diagnosis not present

## 2017-08-18 DIAGNOSIS — D509 Iron deficiency anemia, unspecified: Secondary | ICD-10-CM | POA: Diagnosis not present

## 2017-08-20 DIAGNOSIS — N186 End stage renal disease: Secondary | ICD-10-CM | POA: Diagnosis not present

## 2017-08-20 DIAGNOSIS — D509 Iron deficiency anemia, unspecified: Secondary | ICD-10-CM | POA: Diagnosis not present

## 2017-08-20 DIAGNOSIS — D631 Anemia in chronic kidney disease: Secondary | ICD-10-CM | POA: Diagnosis not present

## 2017-08-20 DIAGNOSIS — E1129 Type 2 diabetes mellitus with other diabetic kidney complication: Secondary | ICD-10-CM | POA: Diagnosis not present

## 2017-08-20 DIAGNOSIS — N2581 Secondary hyperparathyroidism of renal origin: Secondary | ICD-10-CM | POA: Diagnosis not present

## 2017-08-21 MED FILL — !VIAGRA 50 MG TABLET: 50 | 30 days supply | Qty: 10 | Fill #0

## 2017-08-22 DIAGNOSIS — D631 Anemia in chronic kidney disease: Secondary | ICD-10-CM | POA: Diagnosis not present

## 2017-08-22 DIAGNOSIS — N186 End stage renal disease: Secondary | ICD-10-CM | POA: Diagnosis not present

## 2017-08-22 DIAGNOSIS — N2581 Secondary hyperparathyroidism of renal origin: Secondary | ICD-10-CM | POA: Diagnosis not present

## 2017-08-22 DIAGNOSIS — E1129 Type 2 diabetes mellitus with other diabetic kidney complication: Secondary | ICD-10-CM | POA: Diagnosis not present

## 2017-08-22 DIAGNOSIS — D509 Iron deficiency anemia, unspecified: Secondary | ICD-10-CM | POA: Diagnosis not present

## 2017-08-27 DIAGNOSIS — E1129 Type 2 diabetes mellitus with other diabetic kidney complication: Secondary | ICD-10-CM | POA: Diagnosis not present

## 2017-08-27 DIAGNOSIS — D631 Anemia in chronic kidney disease: Secondary | ICD-10-CM | POA: Diagnosis not present

## 2017-08-27 DIAGNOSIS — N186 End stage renal disease: Secondary | ICD-10-CM | POA: Diagnosis not present

## 2017-08-27 DIAGNOSIS — D509 Iron deficiency anemia, unspecified: Secondary | ICD-10-CM | POA: Diagnosis not present

## 2017-08-27 DIAGNOSIS — N2581 Secondary hyperparathyroidism of renal origin: Secondary | ICD-10-CM | POA: Diagnosis not present

## 2017-08-29 DIAGNOSIS — N2581 Secondary hyperparathyroidism of renal origin: Secondary | ICD-10-CM | POA: Diagnosis not present

## 2017-08-29 DIAGNOSIS — N186 End stage renal disease: Secondary | ICD-10-CM | POA: Diagnosis not present

## 2017-08-29 DIAGNOSIS — E1129 Type 2 diabetes mellitus with other diabetic kidney complication: Secondary | ICD-10-CM | POA: Diagnosis not present

## 2017-08-29 DIAGNOSIS — D509 Iron deficiency anemia, unspecified: Secondary | ICD-10-CM | POA: Diagnosis not present

## 2017-08-29 DIAGNOSIS — D631 Anemia in chronic kidney disease: Secondary | ICD-10-CM | POA: Diagnosis not present

## 2017-08-31 DIAGNOSIS — N2581 Secondary hyperparathyroidism of renal origin: Secondary | ICD-10-CM | POA: Diagnosis not present

## 2017-08-31 DIAGNOSIS — E1129 Type 2 diabetes mellitus with other diabetic kidney complication: Secondary | ICD-10-CM | POA: Diagnosis not present

## 2017-08-31 DIAGNOSIS — D631 Anemia in chronic kidney disease: Secondary | ICD-10-CM | POA: Diagnosis not present

## 2017-08-31 DIAGNOSIS — D509 Iron deficiency anemia, unspecified: Secondary | ICD-10-CM | POA: Diagnosis not present

## 2017-08-31 DIAGNOSIS — N186 End stage renal disease: Secondary | ICD-10-CM | POA: Diagnosis not present

## 2017-09-01 DIAGNOSIS — N186 End stage renal disease: Secondary | ICD-10-CM | POA: Diagnosis not present

## 2017-09-01 DIAGNOSIS — Z992 Dependence on renal dialysis: Secondary | ICD-10-CM | POA: Diagnosis not present

## 2017-09-01 DIAGNOSIS — I129 Hypertensive chronic kidney disease with stage 1 through stage 4 chronic kidney disease, or unspecified chronic kidney disease: Secondary | ICD-10-CM | POA: Diagnosis not present

## 2017-09-03 DIAGNOSIS — N186 End stage renal disease: Secondary | ICD-10-CM | POA: Diagnosis not present

## 2017-09-03 DIAGNOSIS — D631 Anemia in chronic kidney disease: Secondary | ICD-10-CM | POA: Diagnosis not present

## 2017-09-03 DIAGNOSIS — D509 Iron deficiency anemia, unspecified: Secondary | ICD-10-CM | POA: Diagnosis not present

## 2017-09-03 DIAGNOSIS — N2581 Secondary hyperparathyroidism of renal origin: Secondary | ICD-10-CM | POA: Diagnosis not present

## 2017-09-03 DIAGNOSIS — E1129 Type 2 diabetes mellitus with other diabetic kidney complication: Secondary | ICD-10-CM | POA: Diagnosis not present

## 2017-09-04 ENCOUNTER — Ambulatory Visit: Payer: Medicaid Other | Admitting: Internal Medicine

## 2017-09-04 DIAGNOSIS — F329 Major depressive disorder, single episode, unspecified: Secondary | ICD-10-CM | POA: Insufficient documentation

## 2017-09-04 DIAGNOSIS — IMO0002 Reserved for concepts with insufficient information to code with codable children: Secondary | ICD-10-CM | POA: Insufficient documentation

## 2017-09-04 DIAGNOSIS — K226 Gastro-esophageal laceration-hemorrhage syndrome: Secondary | ICD-10-CM | POA: Insufficient documentation

## 2017-09-04 DIAGNOSIS — E1165 Type 2 diabetes mellitus with hyperglycemia: Secondary | ICD-10-CM | POA: Insufficient documentation

## 2017-09-05 DIAGNOSIS — E1129 Type 2 diabetes mellitus with other diabetic kidney complication: Secondary | ICD-10-CM | POA: Diagnosis not present

## 2017-09-05 DIAGNOSIS — N186 End stage renal disease: Secondary | ICD-10-CM | POA: Diagnosis not present

## 2017-09-05 DIAGNOSIS — D509 Iron deficiency anemia, unspecified: Secondary | ICD-10-CM | POA: Diagnosis not present

## 2017-09-05 DIAGNOSIS — Z7189 Other specified counseling: Secondary | ICD-10-CM | POA: Insufficient documentation

## 2017-09-05 DIAGNOSIS — D631 Anemia in chronic kidney disease: Secondary | ICD-10-CM | POA: Diagnosis not present

## 2017-09-05 DIAGNOSIS — N2581 Secondary hyperparathyroidism of renal origin: Secondary | ICD-10-CM | POA: Diagnosis not present

## 2017-09-05 DIAGNOSIS — Z01818 Encounter for other preprocedural examination: Secondary | ICD-10-CM | POA: Insufficient documentation

## 2017-09-08 ENCOUNTER — Encounter: Payer: Self-pay | Admitting: Internal Medicine

## 2017-09-08 ENCOUNTER — Ambulatory Visit: Payer: Medicare Other | Attending: Internal Medicine | Admitting: Internal Medicine

## 2017-09-08 VITALS — BP 161/107 | HR 101 | Temp 98.2°F | Resp 16 | Wt 229.4 lb

## 2017-09-08 DIAGNOSIS — M79671 Pain in right foot: Secondary | ICD-10-CM | POA: Diagnosis not present

## 2017-09-08 DIAGNOSIS — E119 Type 2 diabetes mellitus without complications: Secondary | ICD-10-CM

## 2017-09-08 DIAGNOSIS — Z7682 Awaiting organ transplant status: Secondary | ICD-10-CM | POA: Diagnosis not present

## 2017-09-08 DIAGNOSIS — Z79899 Other long term (current) drug therapy: Secondary | ICD-10-CM | POA: Diagnosis not present

## 2017-09-08 DIAGNOSIS — E669 Obesity, unspecified: Secondary | ICD-10-CM | POA: Insufficient documentation

## 2017-09-08 DIAGNOSIS — K2971 Gastritis, unspecified, with bleeding: Secondary | ICD-10-CM | POA: Diagnosis not present

## 2017-09-08 DIAGNOSIS — I132 Hypertensive heart and chronic kidney disease with heart failure and with stage 5 chronic kidney disease, or end stage renal disease: Secondary | ICD-10-CM | POA: Diagnosis not present

## 2017-09-08 DIAGNOSIS — E1122 Type 2 diabetes mellitus with diabetic chronic kidney disease: Secondary | ICD-10-CM | POA: Insufficient documentation

## 2017-09-08 DIAGNOSIS — Z992 Dependence on renal dialysis: Secondary | ICD-10-CM | POA: Insufficient documentation

## 2017-09-08 DIAGNOSIS — D638 Anemia in other chronic diseases classified elsewhere: Secondary | ICD-10-CM | POA: Diagnosis not present

## 2017-09-08 DIAGNOSIS — G8929 Other chronic pain: Secondary | ICD-10-CM

## 2017-09-08 DIAGNOSIS — D631 Anemia in chronic kidney disease: Secondary | ICD-10-CM | POA: Insufficient documentation

## 2017-09-08 DIAGNOSIS — I10 Essential (primary) hypertension: Secondary | ICD-10-CM

## 2017-09-08 DIAGNOSIS — I509 Heart failure, unspecified: Secondary | ICD-10-CM | POA: Insufficient documentation

## 2017-09-08 DIAGNOSIS — N186 End stage renal disease: Secondary | ICD-10-CM | POA: Diagnosis not present

## 2017-09-08 DIAGNOSIS — K922 Gastrointestinal hemorrhage, unspecified: Secondary | ICD-10-CM | POA: Insufficient documentation

## 2017-09-08 DIAGNOSIS — D509 Iron deficiency anemia, unspecified: Secondary | ICD-10-CM | POA: Diagnosis not present

## 2017-09-08 DIAGNOSIS — K226 Gastro-esophageal laceration-hemorrhage syndrome: Secondary | ICD-10-CM | POA: Insufficient documentation

## 2017-09-08 DIAGNOSIS — E1129 Type 2 diabetes mellitus with other diabetic kidney complication: Secondary | ICD-10-CM | POA: Diagnosis not present

## 2017-09-08 DIAGNOSIS — N2581 Secondary hyperparathyroidism of renal origin: Secondary | ICD-10-CM | POA: Diagnosis not present

## 2017-09-08 LAB — POCT GLYCOSYLATED HEMOGLOBIN (HGB A1C): Hemoglobin A1C: 4.9

## 2017-09-08 LAB — GLUCOSE, POCT (MANUAL RESULT ENTRY): POC GLUCOSE: 85 mg/dL (ref 70–99)

## 2017-09-08 MED ORDER — CARVEDILOL 3.125 MG PO TABS: 3.1250 mg | ORAL_TABLET | Freq: Two times a day (BID) | ORAL | 11 refills | Status: DC

## 2017-09-08 MED FILL — CARVEDILOL 3.125 MG TABLET: 3.125 | 30 days supply | Qty: 60 | Fill #0

## 2017-09-08 NOTE — Progress Notes (Signed)
Patient ID: Patrick Ibarra, male    DOB: Aug 21, 1981  MRN: [id number]  CC: Hypertension   Subjective: Patrick Ibarra is a 37 y.o. male who presents for chronic ds management. Wife is with him. His concerns today include:  Patient with history of HTN, end-stage renal disease on HD, DM type II diet controlled, CHF EF 60-65%, GI bleed secondary to Mallory Weiss tear (transfused 8 units PRBC 04/2017). .  1. HTN/ESRD on HD/CHF: takes Norvasc at nights.  -does HD Mon/Wed/Fri.  Plan is to remove subclavian catheter later this week and they will start using his graft in the left arm  -no CP/SOB/LE edema -he is now on transplant list at [facility].  He has been approved for disability  2. DM: checks BS once a day.  Highest BS reading 110 No blurred vision. Over due for eye exam  3. Anemia: gets iron at HD once a wk  4. RT heel pain: never received call regarding podiatry referral. Patient Active Problem List   Diagnosis Date Noted  . Pancreatitis, acute 04/04/2017  . GI bleed 04/03/2017  . Anemia of chronic disease 04/03/2017  . CHF (congestive heart failure) ([place]) 03/01/2017  . Obesity 03/01/2017  . Anemia of chronic kidney failure 03/01/2017  . Essential hypertension 01/05/2014  . Diabetes mellitus ([place]) 12/24/2011  . ESRD (end stage renal disease) ([place]) 12/24/2011     Current Outpatient Medications on File Prior to Visit  Medication Sig Dispense Refill  . amLODipine (NORVASC) 10 MG tablet Take 1 tablet (10 mg total) by mouth daily. 30 tablet 11  . Blood Glucose Monitoring Suppl (TRUE METRIX METER) w/Device KIT Use as directed 1 kit 0  . calcium acetate (PHOSLO) 667 MG capsule Take 1 capsule (667 mg total) by mouth 3 (three) times daily with meals. 90 capsule 5  . cinacalcet (SENSIPAR) 60 MG tablet Take 60 mg by mouth daily.    . Cinnamon 500 MG capsule Take 1,000 mg by mouth daily.    Marland Kitchen glucose blood (TRUE METRIX BLOOD GLUCOSE TEST) test strip Use as instructed 100 each 12  . sevelamer  carbonate (RENVELA) 800 MG tablet Take 2,400 mg by mouth 3 (three) times daily with meals.     . sildenafil (VIAGRA) 50 MG tablet Take 50 mg by mouth as needed for erectile dysfunction.    . TRUEPLUS LANCETS 28G MISC Use as directed 100 each 1  . calcium carbonate (TUMS) 500 MG chewable tablet 2 tabs PO with each meal and at bedtime. (Patient not taking: Reported on 04/03/2017) 240 tablet 1  . triamcinolone cream (KENALOG) 0.1 % Apply 1 application topically 2 (two) times daily. (Patient not taking: Reported on 05/16/2017) 80 g 1   No current facility-administered medications on file prior to visit.     No Known Allergies  Social History   Socioeconomic History  . Marital status: Married    Spouse name: Not on file  . Number of children: Not on file  . Years of education: Not on file  . Highest education level: Not on file  Social Needs  . Financial resource strain: Not on file  . Food insecurity - worry: Not on file  . Food insecurity - inability: Not on file  . Transportation needs - medical: Not on file  . Transportation needs - non-medical: Not on file  Occupational History  . Not on file  Tobacco Use  . Smoking status: Never Smoker  . Smokeless tobacco: Never Used  Substance and  Sexual Activity  . Alcohol use: Yes    Comment: WINE ONCE A WEEK  . Drug use: No  . Sexual activity: Not on file  Other Topics Concern  . Not on file  Social History Narrative   He is married with step kids. He works at [employer].     Family History  Problem Relation Age of Onset  . Hypertension Mother   . Diabetes Mother   . Hypertension Father     Past Surgical History:  Procedure Laterality Date  . AV FISTULA PLACEMENT Left 04/10/2017   Procedure: ARTERIOVENOUS (AV) FISTULA CREATION LEFT ARM;  Surgeon: Conrad [place], MD;  Location: [facility];  Service: Vascular;  Laterality: Left;  . ESOPHAGOGASTRODUODENOSCOPY (EGD) WITH PROPOFOL N/A 04/04/2017   Procedure: ESOPHAGOGASTRODUODENOSCOPY  (EGD) WITH PROPOFOL;  Surgeon: Carol Ada, MD;  Location: [facility];  Service: Endoscopy;  Laterality: N/A;  . INSERTION OF DIALYSIS CATHETER Right 04/10/2017   Procedure: INSERTION OF DIALYSIS CATHETER - RIGHT INTERNAL JUGULAR PLACEMENT;  Surgeon: Conrad [place], MD;  Location: [facility];  Service: Vascular;  Laterality: Right;  . TONSILLECTOMY AND ADENOIDECTOMY      ROS: Review of Systems Neg except as above PHYSICAL EXAM: BP (!) 161/107   Pulse (!) 101   Temp 98.2 F (36.8 C) (Oral)   Resp 16   Wt 229 lb 6.4 oz (104.1 kg)   SpO2 98%   BMI 30.27 kg/m   Physical Exam General appearance - alert, well appearing, young AAM and in no distress Mental status - alert, oriented to person, place, and time, normal mood, behavior, speech, dress, motor activity, and thought processes Neck - supple, no significant adenopathy Chest - clear to auscultation, no wheezes, rales or rhonchi, symmetric air entry Heart - RRR Extremities -trace LE edema.  Graft LUE with thrill Breast - mild gynecomastia  BS 85 Lab Results  Component Value Date   HGBA1C 5.8 (H) 03/01/2017    ASSESSMENT AND PLAN: 1. Essential hypertension Not at goal.  Add low-dose of carvedilol.  On dialysis days,  take a.m. dose after dialysis - carvedilol (COREG) 3.125 MG tablet; Take 1 tablet (3.125 mg total) by mouth 2 (two) times daily with a meal.  Dispense: 60 tablet; Refill: 11  2. ESRD (end stage renal disease) ([place]) On hemodialysis and has started the process of getting placed on transplant list at [facility]  3. Controlled type 2 diabetes mellitus without complication, without long-term current use of insulin ([place]) -Well controlled on diet alone.  Based on last 2 A1c's he is not diabetic anymore.  Encouraged him to continue healthy eating habits nonetheless - Ambulatory referral to Ophthalmology  4. Anemia of chronic disease Receiving iron or Aranesp at dialysis  5. Heel pain, chronic, right Message sent to our  referral person about getting the podiatry appt as ordered on last visit  Patient was given the opportunity to ask questions.  Patient verbalized understanding of the plan and was able to repeat key elements of the plan.   Orders Placed This Encounter  Procedures  . Ambulatory referral to Ophthalmology     Requested Prescriptions   Signed Prescriptions Disp Refills  . carvedilol (COREG) 3.125 MG tablet 60 tablet 11    Sig: Take 1 tablet (3.125 mg total) by mouth 2 (two) times daily with a meal.    Return in about 4 months (around 01/06/2018).  Karle Plumber, MD, FACP

## 2017-09-08 NOTE — Patient Instructions (Signed)
Your blood pressure is not well controlled.  We have added Carvedilol 3.125 mg twice a day.

## 2017-09-08 NOTE — Addendum Note (Signed)
Addended by: Boykin Reaper R on: 09/08/2017 01:50 PM   Modules accepted: Orders

## 2017-09-09 DIAGNOSIS — Z452 Encounter for adjustment and management of vascular access device: Secondary | ICD-10-CM | POA: Diagnosis not present

## 2017-09-10 DIAGNOSIS — N186 End stage renal disease: Secondary | ICD-10-CM | POA: Diagnosis not present

## 2017-09-10 DIAGNOSIS — D509 Iron deficiency anemia, unspecified: Secondary | ICD-10-CM | POA: Diagnosis not present

## 2017-09-10 DIAGNOSIS — N2581 Secondary hyperparathyroidism of renal origin: Secondary | ICD-10-CM | POA: Diagnosis not present

## 2017-09-10 DIAGNOSIS — D631 Anemia in chronic kidney disease: Secondary | ICD-10-CM | POA: Diagnosis not present

## 2017-09-10 DIAGNOSIS — E1129 Type 2 diabetes mellitus with other diabetic kidney complication: Secondary | ICD-10-CM | POA: Diagnosis not present

## 2017-09-12 DIAGNOSIS — N2581 Secondary hyperparathyroidism of renal origin: Secondary | ICD-10-CM | POA: Diagnosis not present

## 2017-09-12 DIAGNOSIS — N186 End stage renal disease: Secondary | ICD-10-CM | POA: Diagnosis not present

## 2017-09-12 DIAGNOSIS — E1129 Type 2 diabetes mellitus with other diabetic kidney complication: Secondary | ICD-10-CM | POA: Diagnosis not present

## 2017-09-12 DIAGNOSIS — D631 Anemia in chronic kidney disease: Secondary | ICD-10-CM | POA: Diagnosis not present

## 2017-09-12 DIAGNOSIS — D509 Iron deficiency anemia, unspecified: Secondary | ICD-10-CM | POA: Diagnosis not present

## 2017-09-12 MED FILL — AMLODIPINE BESYLATE 10 MG T: 10 | 30 days supply | Qty: 30 | Fill #0

## 2017-09-15 DIAGNOSIS — N2581 Secondary hyperparathyroidism of renal origin: Secondary | ICD-10-CM | POA: Diagnosis not present

## 2017-09-15 DIAGNOSIS — D509 Iron deficiency anemia, unspecified: Secondary | ICD-10-CM | POA: Diagnosis not present

## 2017-09-15 DIAGNOSIS — N186 End stage renal disease: Secondary | ICD-10-CM | POA: Diagnosis not present

## 2017-09-15 DIAGNOSIS — E1129 Type 2 diabetes mellitus with other diabetic kidney complication: Secondary | ICD-10-CM | POA: Diagnosis not present

## 2017-09-15 DIAGNOSIS — D631 Anemia in chronic kidney disease: Secondary | ICD-10-CM | POA: Diagnosis not present

## 2017-09-15 MED FILL — SEVELAMER CARBONATE 800 MG: 800 | 30 days supply | Qty: 390 | Fill #1

## 2017-09-17 DIAGNOSIS — E1129 Type 2 diabetes mellitus with other diabetic kidney complication: Secondary | ICD-10-CM | POA: Diagnosis not present

## 2017-09-17 DIAGNOSIS — D509 Iron deficiency anemia, unspecified: Secondary | ICD-10-CM | POA: Diagnosis not present

## 2017-09-17 DIAGNOSIS — D631 Anemia in chronic kidney disease: Secondary | ICD-10-CM | POA: Diagnosis not present

## 2017-09-17 DIAGNOSIS — N186 End stage renal disease: Secondary | ICD-10-CM | POA: Diagnosis not present

## 2017-09-17 DIAGNOSIS — N2581 Secondary hyperparathyroidism of renal origin: Secondary | ICD-10-CM | POA: Diagnosis not present

## 2017-09-19 DIAGNOSIS — D631 Anemia in chronic kidney disease: Secondary | ICD-10-CM | POA: Diagnosis not present

## 2017-09-19 DIAGNOSIS — N2581 Secondary hyperparathyroidism of renal origin: Secondary | ICD-10-CM | POA: Diagnosis not present

## 2017-09-19 DIAGNOSIS — N186 End stage renal disease: Secondary | ICD-10-CM | POA: Diagnosis not present

## 2017-09-19 DIAGNOSIS — D509 Iron deficiency anemia, unspecified: Secondary | ICD-10-CM | POA: Diagnosis not present

## 2017-09-19 DIAGNOSIS — E1129 Type 2 diabetes mellitus with other diabetic kidney complication: Secondary | ICD-10-CM | POA: Diagnosis not present

## 2017-09-19 MED FILL — POLYETHYLENE GLYCOL 3350 PO: 30 days supply | Qty: 510 | Fill #0

## 2017-09-22 DIAGNOSIS — E1129 Type 2 diabetes mellitus with other diabetic kidney complication: Secondary | ICD-10-CM | POA: Diagnosis not present

## 2017-09-22 DIAGNOSIS — D631 Anemia in chronic kidney disease: Secondary | ICD-10-CM | POA: Diagnosis not present

## 2017-09-22 DIAGNOSIS — N2581 Secondary hyperparathyroidism of renal origin: Secondary | ICD-10-CM | POA: Diagnosis not present

## 2017-09-22 DIAGNOSIS — D509 Iron deficiency anemia, unspecified: Secondary | ICD-10-CM | POA: Diagnosis not present

## 2017-09-22 DIAGNOSIS — N186 End stage renal disease: Secondary | ICD-10-CM | POA: Diagnosis not present

## 2017-09-24 ENCOUNTER — Telehealth: Payer: Self-pay | Admitting: Internal Medicine

## 2017-09-24 DIAGNOSIS — E1129 Type 2 diabetes mellitus with other diabetic kidney complication: Secondary | ICD-10-CM | POA: Diagnosis not present

## 2017-09-24 DIAGNOSIS — D631 Anemia in chronic kidney disease: Secondary | ICD-10-CM | POA: Diagnosis not present

## 2017-09-24 DIAGNOSIS — D509 Iron deficiency anemia, unspecified: Secondary | ICD-10-CM | POA: Diagnosis not present

## 2017-09-24 DIAGNOSIS — N2581 Secondary hyperparathyroidism of renal origin: Secondary | ICD-10-CM | POA: Diagnosis not present

## 2017-09-24 DIAGNOSIS — N186 End stage renal disease: Secondary | ICD-10-CM | POA: Diagnosis not present

## 2017-09-24 NOTE — Telephone Encounter (Signed)
Pt's wife came by to drop off paperwork

## 2017-09-26 DIAGNOSIS — E1129 Type 2 diabetes mellitus with other diabetic kidney complication: Secondary | ICD-10-CM | POA: Diagnosis not present

## 2017-09-26 DIAGNOSIS — N186 End stage renal disease: Secondary | ICD-10-CM | POA: Diagnosis not present

## 2017-09-26 DIAGNOSIS — D631 Anemia in chronic kidney disease: Secondary | ICD-10-CM | POA: Diagnosis not present

## 2017-09-26 DIAGNOSIS — N2581 Secondary hyperparathyroidism of renal origin: Secondary | ICD-10-CM | POA: Diagnosis not present

## 2017-09-26 DIAGNOSIS — D509 Iron deficiency anemia, unspecified: Secondary | ICD-10-CM | POA: Diagnosis not present

## 2017-09-29 ENCOUNTER — Other Ambulatory Visit: Payer: Self-pay | Admitting: Nephrology

## 2017-09-29 DIAGNOSIS — Z01818 Encounter for other preprocedural examination: Secondary | ICD-10-CM

## 2017-09-29 DIAGNOSIS — E1129 Type 2 diabetes mellitus with other diabetic kidney complication: Secondary | ICD-10-CM | POA: Diagnosis not present

## 2017-09-29 DIAGNOSIS — N2581 Secondary hyperparathyroidism of renal origin: Secondary | ICD-10-CM | POA: Diagnosis not present

## 2017-09-29 DIAGNOSIS — D509 Iron deficiency anemia, unspecified: Secondary | ICD-10-CM | POA: Diagnosis not present

## 2017-09-29 DIAGNOSIS — D631 Anemia in chronic kidney disease: Secondary | ICD-10-CM | POA: Diagnosis not present

## 2017-09-29 DIAGNOSIS — N186 End stage renal disease: Secondary | ICD-10-CM | POA: Diagnosis not present

## 2017-10-01 DIAGNOSIS — N2581 Secondary hyperparathyroidism of renal origin: Secondary | ICD-10-CM | POA: Diagnosis not present

## 2017-10-01 DIAGNOSIS — D509 Iron deficiency anemia, unspecified: Secondary | ICD-10-CM | POA: Diagnosis not present

## 2017-10-01 DIAGNOSIS — D631 Anemia in chronic kidney disease: Secondary | ICD-10-CM | POA: Diagnosis not present

## 2017-10-01 DIAGNOSIS — E1129 Type 2 diabetes mellitus with other diabetic kidney complication: Secondary | ICD-10-CM | POA: Diagnosis not present

## 2017-10-01 DIAGNOSIS — N186 End stage renal disease: Secondary | ICD-10-CM | POA: Diagnosis not present

## 2017-10-02 DIAGNOSIS — I129 Hypertensive chronic kidney disease with stage 1 through stage 4 chronic kidney disease, or unspecified chronic kidney disease: Secondary | ICD-10-CM | POA: Diagnosis not present

## 2017-10-02 DIAGNOSIS — Z992 Dependence on renal dialysis: Secondary | ICD-10-CM | POA: Diagnosis not present

## 2017-10-02 DIAGNOSIS — N186 End stage renal disease: Secondary | ICD-10-CM | POA: Diagnosis not present

## 2017-10-03 DIAGNOSIS — N2581 Secondary hyperparathyroidism of renal origin: Secondary | ICD-10-CM | POA: Diagnosis not present

## 2017-10-03 DIAGNOSIS — Z992 Dependence on renal dialysis: Secondary | ICD-10-CM | POA: Diagnosis not present

## 2017-10-03 DIAGNOSIS — D509 Iron deficiency anemia, unspecified: Secondary | ICD-10-CM | POA: Diagnosis not present

## 2017-10-03 DIAGNOSIS — E1129 Type 2 diabetes mellitus with other diabetic kidney complication: Secondary | ICD-10-CM | POA: Diagnosis not present

## 2017-10-03 DIAGNOSIS — N186 End stage renal disease: Secondary | ICD-10-CM | POA: Diagnosis not present

## 2017-10-03 DIAGNOSIS — I129 Hypertensive chronic kidney disease with stage 1 through stage 4 chronic kidney disease, or unspecified chronic kidney disease: Secondary | ICD-10-CM | POA: Diagnosis not present

## 2017-10-03 DIAGNOSIS — D631 Anemia in chronic kidney disease: Secondary | ICD-10-CM | POA: Diagnosis not present

## 2017-10-06 DIAGNOSIS — D631 Anemia in chronic kidney disease: Secondary | ICD-10-CM | POA: Diagnosis not present

## 2017-10-06 DIAGNOSIS — N186 End stage renal disease: Secondary | ICD-10-CM | POA: Diagnosis not present

## 2017-10-06 DIAGNOSIS — D509 Iron deficiency anemia, unspecified: Secondary | ICD-10-CM | POA: Diagnosis not present

## 2017-10-06 DIAGNOSIS — E1129 Type 2 diabetes mellitus with other diabetic kidney complication: Secondary | ICD-10-CM | POA: Diagnosis not present

## 2017-10-06 DIAGNOSIS — N2581 Secondary hyperparathyroidism of renal origin: Secondary | ICD-10-CM | POA: Diagnosis not present

## 2017-10-08 DIAGNOSIS — D631 Anemia in chronic kidney disease: Secondary | ICD-10-CM | POA: Diagnosis not present

## 2017-10-08 DIAGNOSIS — N186 End stage renal disease: Secondary | ICD-10-CM | POA: Diagnosis not present

## 2017-10-08 DIAGNOSIS — E1129 Type 2 diabetes mellitus with other diabetic kidney complication: Secondary | ICD-10-CM | POA: Diagnosis not present

## 2017-10-08 DIAGNOSIS — N2581 Secondary hyperparathyroidism of renal origin: Secondary | ICD-10-CM | POA: Diagnosis not present

## 2017-10-08 DIAGNOSIS — D509 Iron deficiency anemia, unspecified: Secondary | ICD-10-CM | POA: Diagnosis not present

## 2017-10-10 DIAGNOSIS — D631 Anemia in chronic kidney disease: Secondary | ICD-10-CM | POA: Diagnosis not present

## 2017-10-10 DIAGNOSIS — N2581 Secondary hyperparathyroidism of renal origin: Secondary | ICD-10-CM | POA: Diagnosis not present

## 2017-10-10 DIAGNOSIS — N186 End stage renal disease: Secondary | ICD-10-CM | POA: Diagnosis not present

## 2017-10-10 DIAGNOSIS — D509 Iron deficiency anemia, unspecified: Secondary | ICD-10-CM | POA: Diagnosis not present

## 2017-10-10 DIAGNOSIS — E1129 Type 2 diabetes mellitus with other diabetic kidney complication: Secondary | ICD-10-CM | POA: Diagnosis not present

## 2017-10-13 DIAGNOSIS — D631 Anemia in chronic kidney disease: Secondary | ICD-10-CM | POA: Diagnosis not present

## 2017-10-13 DIAGNOSIS — N186 End stage renal disease: Secondary | ICD-10-CM | POA: Diagnosis not present

## 2017-10-13 DIAGNOSIS — D509 Iron deficiency anemia, unspecified: Secondary | ICD-10-CM | POA: Diagnosis not present

## 2017-10-13 DIAGNOSIS — E1129 Type 2 diabetes mellitus with other diabetic kidney complication: Secondary | ICD-10-CM | POA: Diagnosis not present

## 2017-10-13 DIAGNOSIS — N2581 Secondary hyperparathyroidism of renal origin: Secondary | ICD-10-CM | POA: Diagnosis not present

## 2017-10-13 MED FILL — $Viagra 50mg tablet: 50 | 30 days supply | Qty: 10 | Fill #1

## 2017-10-13 MED FILL — CARVEDILOL 3.125 MG TABLET: 3.125 | 30 days supply | Qty: 60 | Fill #1

## 2017-10-14 ENCOUNTER — Other Ambulatory Visit (HOSPITAL_COMMUNITY): Payer: Medicaid Other

## 2017-10-15 ENCOUNTER — Telehealth (HOSPITAL_COMMUNITY): Payer: Self-pay | Admitting: *Deleted

## 2017-10-15 DIAGNOSIS — E1129 Type 2 diabetes mellitus with other diabetic kidney complication: Secondary | ICD-10-CM | POA: Diagnosis not present

## 2017-10-15 DIAGNOSIS — I10 Essential (primary) hypertension: Secondary | ICD-10-CM | POA: Diagnosis not present

## 2017-10-15 DIAGNOSIS — D631 Anemia in chronic kidney disease: Secondary | ICD-10-CM | POA: Diagnosis not present

## 2017-10-15 DIAGNOSIS — D509 Iron deficiency anemia, unspecified: Secondary | ICD-10-CM | POA: Diagnosis not present

## 2017-10-15 DIAGNOSIS — N2581 Secondary hyperparathyroidism of renal origin: Secondary | ICD-10-CM | POA: Diagnosis not present

## 2017-10-15 DIAGNOSIS — N186 End stage renal disease: Secondary | ICD-10-CM | POA: Diagnosis not present

## 2017-10-15 MED FILL — AMLODIPINE BESYLATE 10 MG T: 10 | 30 days supply | Qty: 30 | Fill #1 | Status: TO

## 2017-10-15 NOTE — Telephone Encounter (Signed)
Patient given detailed instructions per Myocardial Perfusion Study Information Sheet for the test on 10/21/17. Patient notified to arrive 15 minutes early and that it is imperative to arrive on time for appointment to keep from having the test rescheduled.  If you need to cancel or reschedule your appointment, please call the office within 24 hours of your appointment. . Patient verbalized understanding. Kirstie Peri

## 2017-10-17 DIAGNOSIS — N186 End stage renal disease: Secondary | ICD-10-CM | POA: Diagnosis not present

## 2017-10-17 DIAGNOSIS — D631 Anemia in chronic kidney disease: Secondary | ICD-10-CM | POA: Diagnosis not present

## 2017-10-17 DIAGNOSIS — D509 Iron deficiency anemia, unspecified: Secondary | ICD-10-CM | POA: Diagnosis not present

## 2017-10-17 DIAGNOSIS — E1129 Type 2 diabetes mellitus with other diabetic kidney complication: Secondary | ICD-10-CM | POA: Diagnosis not present

## 2017-10-17 DIAGNOSIS — N2581 Secondary hyperparathyroidism of renal origin: Secondary | ICD-10-CM | POA: Diagnosis not present

## 2017-10-21 ENCOUNTER — Other Ambulatory Visit (HOSPITAL_COMMUNITY): Payer: Medicaid Other

## 2017-10-21 ENCOUNTER — Ambulatory Visit (HOSPITAL_COMMUNITY): Payer: Medicare Other | Attending: Nephrology

## 2017-10-21 ENCOUNTER — Other Ambulatory Visit: Payer: Self-pay

## 2017-10-21 ENCOUNTER — Ambulatory Visit (HOSPITAL_BASED_OUTPATIENT_CLINIC_OR_DEPARTMENT_OTHER): Payer: Medicare Other

## 2017-10-21 DIAGNOSIS — Z01818 Encounter for other preprocedural examination: Secondary | ICD-10-CM | POA: Insufficient documentation

## 2017-10-21 DIAGNOSIS — Z01810 Encounter for preprocedural cardiovascular examination: Secondary | ICD-10-CM | POA: Insufficient documentation

## 2017-10-21 LAB — ECHOCARDIOGRAM COMPLETE
Height: 73 in
Weight: 3664 oz

## 2017-10-21 LAB — MYOCARDIAL PERFUSION IMAGING
CHL CUP NUCLEAR SDS: 1
CHL CUP NUCLEAR SRS: 2
CHL CUP NUCLEAR SSS: 3
LHR: 0.35
LV dias vol: 184 mL (ref 62–150)
LV sys vol: 81 mL
NUC STRESS TID: 0.95
Peak HR: 103 {beats}/min
Rest HR: 78 {beats}/min

## 2017-10-21 MED ORDER — TECHNETIUM TC 99M TETROFOSMIN IV KIT
31.6000 | PACK | Freq: Once | INTRAVENOUS | Status: AC | PRN
  Administered 2017-10-21: 31.6 via INTRAVENOUS
  Filled 2017-10-21: qty 32

## 2017-10-21 MED ORDER — REGADENOSON 0.4 MG/5ML IV SOLN
0.4000 mg | Freq: Once | INTRAVENOUS | Status: AC
  Administered 2017-10-21: 0.4 mg via INTRAVENOUS

## 2017-10-21 MED ORDER — TECHNETIUM TC 99M TETROFOSMIN IV KIT
10.8000 | PACK | Freq: Once | INTRAVENOUS | Status: AC | PRN
  Administered 2017-10-21: 10.8 via INTRAVENOUS
  Filled 2017-10-21: qty 11

## 2017-10-22 DIAGNOSIS — D509 Iron deficiency anemia, unspecified: Secondary | ICD-10-CM | POA: Diagnosis not present

## 2017-10-22 DIAGNOSIS — N186 End stage renal disease: Secondary | ICD-10-CM | POA: Diagnosis not present

## 2017-10-22 DIAGNOSIS — E1129 Type 2 diabetes mellitus with other diabetic kidney complication: Secondary | ICD-10-CM | POA: Diagnosis not present

## 2017-10-22 DIAGNOSIS — N2581 Secondary hyperparathyroidism of renal origin: Secondary | ICD-10-CM | POA: Diagnosis not present

## 2017-10-22 DIAGNOSIS — D631 Anemia in chronic kidney disease: Secondary | ICD-10-CM | POA: Diagnosis not present

## 2017-10-24 DIAGNOSIS — D509 Iron deficiency anemia, unspecified: Secondary | ICD-10-CM | POA: Diagnosis not present

## 2017-10-24 DIAGNOSIS — D631 Anemia in chronic kidney disease: Secondary | ICD-10-CM | POA: Diagnosis not present

## 2017-10-24 DIAGNOSIS — N2581 Secondary hyperparathyroidism of renal origin: Secondary | ICD-10-CM | POA: Diagnosis not present

## 2017-10-24 DIAGNOSIS — N186 End stage renal disease: Secondary | ICD-10-CM | POA: Diagnosis not present

## 2017-10-24 DIAGNOSIS — E1129 Type 2 diabetes mellitus with other diabetic kidney complication: Secondary | ICD-10-CM | POA: Diagnosis not present

## 2017-10-27 DIAGNOSIS — D631 Anemia in chronic kidney disease: Secondary | ICD-10-CM | POA: Diagnosis not present

## 2017-10-27 DIAGNOSIS — N2581 Secondary hyperparathyroidism of renal origin: Secondary | ICD-10-CM | POA: Diagnosis not present

## 2017-10-27 DIAGNOSIS — D509 Iron deficiency anemia, unspecified: Secondary | ICD-10-CM | POA: Diagnosis not present

## 2017-10-27 DIAGNOSIS — N186 End stage renal disease: Secondary | ICD-10-CM | POA: Diagnosis not present

## 2017-10-27 DIAGNOSIS — E1129 Type 2 diabetes mellitus with other diabetic kidney complication: Secondary | ICD-10-CM | POA: Diagnosis not present

## 2017-10-29 DIAGNOSIS — D631 Anemia in chronic kidney disease: Secondary | ICD-10-CM | POA: Diagnosis not present

## 2017-10-29 DIAGNOSIS — D509 Iron deficiency anemia, unspecified: Secondary | ICD-10-CM | POA: Diagnosis not present

## 2017-10-29 DIAGNOSIS — E1129 Type 2 diabetes mellitus with other diabetic kidney complication: Secondary | ICD-10-CM | POA: Diagnosis not present

## 2017-10-29 DIAGNOSIS — N2581 Secondary hyperparathyroidism of renal origin: Secondary | ICD-10-CM | POA: Diagnosis not present

## 2017-10-29 DIAGNOSIS — N186 End stage renal disease: Secondary | ICD-10-CM | POA: Diagnosis not present

## 2017-10-31 DIAGNOSIS — D509 Iron deficiency anemia, unspecified: Secondary | ICD-10-CM | POA: Diagnosis not present

## 2017-10-31 DIAGNOSIS — Z23 Encounter for immunization: Secondary | ICD-10-CM | POA: Diagnosis not present

## 2017-10-31 DIAGNOSIS — N186 End stage renal disease: Secondary | ICD-10-CM | POA: Diagnosis not present

## 2017-10-31 DIAGNOSIS — Z992 Dependence on renal dialysis: Secondary | ICD-10-CM | POA: Diagnosis not present

## 2017-10-31 DIAGNOSIS — E1129 Type 2 diabetes mellitus with other diabetic kidney complication: Secondary | ICD-10-CM | POA: Diagnosis not present

## 2017-10-31 DIAGNOSIS — I129 Hypertensive chronic kidney disease with stage 1 through stage 4 chronic kidney disease, or unspecified chronic kidney disease: Secondary | ICD-10-CM | POA: Diagnosis not present

## 2017-10-31 DIAGNOSIS — N2581 Secondary hyperparathyroidism of renal origin: Secondary | ICD-10-CM | POA: Diagnosis not present

## 2017-11-03 DIAGNOSIS — E1129 Type 2 diabetes mellitus with other diabetic kidney complication: Secondary | ICD-10-CM | POA: Diagnosis not present

## 2017-11-03 DIAGNOSIS — D509 Iron deficiency anemia, unspecified: Secondary | ICD-10-CM | POA: Diagnosis not present

## 2017-11-03 DIAGNOSIS — N186 End stage renal disease: Secondary | ICD-10-CM | POA: Diagnosis not present

## 2017-11-03 DIAGNOSIS — Z23 Encounter for immunization: Secondary | ICD-10-CM | POA: Diagnosis not present

## 2017-11-03 DIAGNOSIS — N2581 Secondary hyperparathyroidism of renal origin: Secondary | ICD-10-CM | POA: Diagnosis not present

## 2017-11-03 MED FILL — TRUE METRIX TEST STRIP: 25 days supply | Qty: 100 | Fill #2

## 2017-11-05 DIAGNOSIS — Z23 Encounter for immunization: Secondary | ICD-10-CM | POA: Diagnosis not present

## 2017-11-05 DIAGNOSIS — D509 Iron deficiency anemia, unspecified: Secondary | ICD-10-CM | POA: Diagnosis not present

## 2017-11-05 DIAGNOSIS — N186 End stage renal disease: Secondary | ICD-10-CM | POA: Diagnosis not present

## 2017-11-05 DIAGNOSIS — E1129 Type 2 diabetes mellitus with other diabetic kidney complication: Secondary | ICD-10-CM | POA: Diagnosis not present

## 2017-11-05 DIAGNOSIS — N2581 Secondary hyperparathyroidism of renal origin: Secondary | ICD-10-CM | POA: Diagnosis not present

## 2017-11-06 ENCOUNTER — Other Ambulatory Visit: Payer: Self-pay | Admitting: Pharmacist

## 2017-11-06 MED ORDER — ACCU-CHEK SOFT TOUCH LANCETS MISC: 12 refills | Status: DC

## 2017-11-06 MED ORDER — ACCU-CHEK AVIVA PLUS W/DEVICE KIT: PACK | 0 refills | Status: DC

## 2017-11-06 MED ORDER — GLUCOSE BLOOD VI STRP: ORAL_STRIP | 12 refills | Status: DC

## 2017-11-07 DIAGNOSIS — D509 Iron deficiency anemia, unspecified: Secondary | ICD-10-CM | POA: Diagnosis not present

## 2017-11-07 DIAGNOSIS — E1129 Type 2 diabetes mellitus with other diabetic kidney complication: Secondary | ICD-10-CM | POA: Diagnosis not present

## 2017-11-07 DIAGNOSIS — Z23 Encounter for immunization: Secondary | ICD-10-CM | POA: Diagnosis not present

## 2017-11-07 DIAGNOSIS — N2581 Secondary hyperparathyroidism of renal origin: Secondary | ICD-10-CM | POA: Diagnosis not present

## 2017-11-07 DIAGNOSIS — N186 End stage renal disease: Secondary | ICD-10-CM | POA: Diagnosis not present

## 2017-11-10 DIAGNOSIS — N186 End stage renal disease: Secondary | ICD-10-CM | POA: Diagnosis not present

## 2017-11-10 DIAGNOSIS — D509 Iron deficiency anemia, unspecified: Secondary | ICD-10-CM | POA: Diagnosis not present

## 2017-11-10 DIAGNOSIS — E1129 Type 2 diabetes mellitus with other diabetic kidney complication: Secondary | ICD-10-CM | POA: Diagnosis not present

## 2017-11-10 DIAGNOSIS — N2581 Secondary hyperparathyroidism of renal origin: Secondary | ICD-10-CM | POA: Diagnosis not present

## 2017-11-10 DIAGNOSIS — Z23 Encounter for immunization: Secondary | ICD-10-CM | POA: Diagnosis not present

## 2017-11-11 ENCOUNTER — Other Ambulatory Visit: Payer: Self-pay | Admitting: Pharmacist

## 2017-11-11 MED ORDER — ACCU-CHEK SOFTCLIX LANCETS MISC: 12 refills | Status: DC

## 2017-11-12 DIAGNOSIS — E1129 Type 2 diabetes mellitus with other diabetic kidney complication: Secondary | ICD-10-CM | POA: Diagnosis not present

## 2017-11-12 DIAGNOSIS — N2581 Secondary hyperparathyroidism of renal origin: Secondary | ICD-10-CM | POA: Diagnosis not present

## 2017-11-12 DIAGNOSIS — D509 Iron deficiency anemia, unspecified: Secondary | ICD-10-CM | POA: Diagnosis not present

## 2017-11-12 DIAGNOSIS — Z23 Encounter for immunization: Secondary | ICD-10-CM | POA: Diagnosis not present

## 2017-11-12 DIAGNOSIS — N186 End stage renal disease: Secondary | ICD-10-CM | POA: Diagnosis not present

## 2017-11-12 DIAGNOSIS — I10 Essential (primary) hypertension: Secondary | ICD-10-CM | POA: Diagnosis not present

## 2017-11-13 DIAGNOSIS — D509 Iron deficiency anemia, unspecified: Secondary | ICD-10-CM | POA: Diagnosis not present

## 2017-11-13 DIAGNOSIS — E1129 Type 2 diabetes mellitus with other diabetic kidney complication: Secondary | ICD-10-CM | POA: Diagnosis not present

## 2017-11-13 DIAGNOSIS — N2581 Secondary hyperparathyroidism of renal origin: Secondary | ICD-10-CM | POA: Diagnosis not present

## 2017-11-13 DIAGNOSIS — Z23 Encounter for immunization: Secondary | ICD-10-CM | POA: Diagnosis not present

## 2017-11-13 DIAGNOSIS — N186 End stage renal disease: Secondary | ICD-10-CM | POA: Diagnosis not present

## 2017-11-17 DIAGNOSIS — E1129 Type 2 diabetes mellitus with other diabetic kidney complication: Secondary | ICD-10-CM | POA: Diagnosis not present

## 2017-11-17 DIAGNOSIS — Z23 Encounter for immunization: Secondary | ICD-10-CM | POA: Diagnosis not present

## 2017-11-17 DIAGNOSIS — D509 Iron deficiency anemia, unspecified: Secondary | ICD-10-CM | POA: Diagnosis not present

## 2017-11-17 DIAGNOSIS — N186 End stage renal disease: Secondary | ICD-10-CM | POA: Diagnosis not present

## 2017-11-17 DIAGNOSIS — N2581 Secondary hyperparathyroidism of renal origin: Secondary | ICD-10-CM | POA: Diagnosis not present

## 2017-11-17 MED FILL — SEVELAMER CARBONATE 800 MG: 800 | 30 days supply | Qty: 390 | Fill #2

## 2017-11-17 MED FILL — CARVEDILOL 3.125 MG TABLET: 3.125 | 30 days supply | Qty: 60 | Fill #2

## 2017-11-19 DIAGNOSIS — N2581 Secondary hyperparathyroidism of renal origin: Secondary | ICD-10-CM | POA: Diagnosis not present

## 2017-11-19 DIAGNOSIS — Z23 Encounter for immunization: Secondary | ICD-10-CM | POA: Diagnosis not present

## 2017-11-19 DIAGNOSIS — D509 Iron deficiency anemia, unspecified: Secondary | ICD-10-CM | POA: Diagnosis not present

## 2017-11-19 DIAGNOSIS — N186 End stage renal disease: Secondary | ICD-10-CM | POA: Diagnosis not present

## 2017-11-19 DIAGNOSIS — E1129 Type 2 diabetes mellitus with other diabetic kidney complication: Secondary | ICD-10-CM | POA: Diagnosis not present

## 2017-11-20 MED FILL — $Viagra 50mg tablet: 50 | 30 days supply | Qty: 10 | Fill #2

## 2017-11-21 DIAGNOSIS — Z23 Encounter for immunization: Secondary | ICD-10-CM | POA: Diagnosis not present

## 2017-11-21 DIAGNOSIS — N2581 Secondary hyperparathyroidism of renal origin: Secondary | ICD-10-CM | POA: Diagnosis not present

## 2017-11-21 DIAGNOSIS — E1129 Type 2 diabetes mellitus with other diabetic kidney complication: Secondary | ICD-10-CM | POA: Diagnosis not present

## 2017-11-21 DIAGNOSIS — D509 Iron deficiency anemia, unspecified: Secondary | ICD-10-CM | POA: Diagnosis not present

## 2017-11-21 DIAGNOSIS — N186 End stage renal disease: Secondary | ICD-10-CM | POA: Diagnosis not present

## 2017-11-24 DIAGNOSIS — N186 End stage renal disease: Secondary | ICD-10-CM | POA: Diagnosis not present

## 2017-11-24 DIAGNOSIS — E1129 Type 2 diabetes mellitus with other diabetic kidney complication: Secondary | ICD-10-CM | POA: Diagnosis not present

## 2017-11-24 DIAGNOSIS — N2581 Secondary hyperparathyroidism of renal origin: Secondary | ICD-10-CM | POA: Diagnosis not present

## 2017-11-24 DIAGNOSIS — D509 Iron deficiency anemia, unspecified: Secondary | ICD-10-CM | POA: Diagnosis not present

## 2017-11-24 DIAGNOSIS — Z23 Encounter for immunization: Secondary | ICD-10-CM | POA: Diagnosis not present

## 2017-11-26 DIAGNOSIS — E1129 Type 2 diabetes mellitus with other diabetic kidney complication: Secondary | ICD-10-CM | POA: Diagnosis not present

## 2017-11-26 DIAGNOSIS — N2581 Secondary hyperparathyroidism of renal origin: Secondary | ICD-10-CM | POA: Diagnosis not present

## 2017-11-26 DIAGNOSIS — D509 Iron deficiency anemia, unspecified: Secondary | ICD-10-CM | POA: Diagnosis not present

## 2017-11-26 DIAGNOSIS — Z23 Encounter for immunization: Secondary | ICD-10-CM | POA: Diagnosis not present

## 2017-11-26 DIAGNOSIS — N186 End stage renal disease: Secondary | ICD-10-CM | POA: Diagnosis not present

## 2017-11-26 MED FILL — AMLODIPINE BESYLATE 10 MG T: 10 | 30 days supply | Qty: 30 | Fill #0

## 2017-11-28 DIAGNOSIS — N2581 Secondary hyperparathyroidism of renal origin: Secondary | ICD-10-CM | POA: Diagnosis not present

## 2017-11-28 DIAGNOSIS — E1129 Type 2 diabetes mellitus with other diabetic kidney complication: Secondary | ICD-10-CM | POA: Diagnosis not present

## 2017-11-28 DIAGNOSIS — D509 Iron deficiency anemia, unspecified: Secondary | ICD-10-CM | POA: Diagnosis not present

## 2017-11-28 DIAGNOSIS — Z23 Encounter for immunization: Secondary | ICD-10-CM | POA: Diagnosis not present

## 2017-11-28 DIAGNOSIS — N186 End stage renal disease: Secondary | ICD-10-CM | POA: Diagnosis not present

## 2017-12-01 DIAGNOSIS — D509 Iron deficiency anemia, unspecified: Secondary | ICD-10-CM | POA: Diagnosis not present

## 2017-12-01 DIAGNOSIS — D631 Anemia in chronic kidney disease: Secondary | ICD-10-CM | POA: Diagnosis not present

## 2017-12-01 DIAGNOSIS — N186 End stage renal disease: Secondary | ICD-10-CM | POA: Diagnosis not present

## 2017-12-01 DIAGNOSIS — N2581 Secondary hyperparathyroidism of renal origin: Secondary | ICD-10-CM | POA: Diagnosis not present

## 2017-12-01 DIAGNOSIS — Z992 Dependence on renal dialysis: Secondary | ICD-10-CM | POA: Diagnosis not present

## 2017-12-01 DIAGNOSIS — I129 Hypertensive chronic kidney disease with stage 1 through stage 4 chronic kidney disease, or unspecified chronic kidney disease: Secondary | ICD-10-CM | POA: Diagnosis not present

## 2017-12-01 DIAGNOSIS — E1129 Type 2 diabetes mellitus with other diabetic kidney complication: Secondary | ICD-10-CM | POA: Diagnosis not present

## 2017-12-02 ENCOUNTER — Ambulatory Visit (INDEPENDENT_AMBULATORY_CARE_PROVIDER_SITE_OTHER): Payer: Medicare Other

## 2017-12-02 ENCOUNTER — Ambulatory Visit (INDEPENDENT_AMBULATORY_CARE_PROVIDER_SITE_OTHER): Payer: Medicare Other | Admitting: Podiatry

## 2017-12-02 ENCOUNTER — Encounter: Payer: Self-pay | Admitting: Podiatry

## 2017-12-02 VITALS — BP 114/70 | HR 87 | Resp 16

## 2017-12-02 DIAGNOSIS — M722 Plantar fascial fibromatosis: Secondary | ICD-10-CM

## 2017-12-02 NOTE — Patient Instructions (Signed)

## 2017-12-02 NOTE — Progress Notes (Signed)
Casted/measured for DBS plus "aggressive" DB inserts: min arch fill, 1/8" RF lift, 4* valgus RF post and 4* FF wedge: this to address DM2, pes planus, posterior/lat heel pain, as well as occasional PF.  Also chose [employer]pex shoes.  DBS qualifications: pes planus, PN (occassional pins/needles, B/L hammer toes and other foot/ankle deformities.

## 2017-12-02 NOTE — Progress Notes (Signed)
Subjective:  Patient ID: Patrick Ibarra, male    DOB: [birth year],  MRN: [id number] HPI Chief Complaint  Patient presents with  . Foot Pain    Plantar and posterior heel bilateral - aching x years, AM pain, had evaluated by doc some years ago and had xrayed-said had spurs, went to [employer] store, measured for inserts, but too expensive, soaking in epsom-some help  . Diabetes    Last a1c was 4.9  . New Patient (Initial Visit)    37 y.o. male presents with the above complaint.   ROS: Denies fever chills nausea vomiting muscle aches pains calf pain chest pain shortness of breath headache.  Past Medical History:  Diagnosis Date  . Hypertension   . LV dysfunction   . Obesity    Past Surgical History:  Procedure Laterality Date  . AV FISTULA PLACEMENT Left 04/10/2017   Procedure: ARTERIOVENOUS (AV) FISTULA CREATION LEFT ARM;  Surgeon: Conrad [place], MD;  Location: [facility];  Service: Vascular;  Laterality: Left;  . ESOPHAGOGASTRODUODENOSCOPY (EGD) WITH PROPOFOL N/A 04/04/2017   Procedure: ESOPHAGOGASTRODUODENOSCOPY (EGD) WITH PROPOFOL;  Surgeon: Carol Ada, MD;  Location: [facility];  Service: Endoscopy;  Laterality: N/A;  . INSERTION OF DIALYSIS CATHETER Right 04/10/2017   Procedure: INSERTION OF DIALYSIS CATHETER - RIGHT INTERNAL JUGULAR PLACEMENT;  Surgeon: Conrad P[place], MD;  Location: [facility];  Service: Vascular;  Laterality: Right;  . TONSILLECTOMY AND ADENOIDECTOMY      Current Outpatient Medications:  .  ACCU-CHEK SOFTCLIX LANCETS lancets, Use as instructed to check blood sugar once daily. E11.9, Disp: 100 each, Rfl: 12 .  amLODipine (NORVASC) 10 MG tablet, Take 1 tablet (10 mg total) by mouth daily., Disp: 30 tablet, Rfl: 11 .  Blood Glucose Monitoring Suppl (ACCU-CHEK AVIVA PLUS) w/Device KIT, Use as instructed to check blood sugar once daily. E11.9, Disp: 1 kit, Rfl: 0 .  calcium acetate (PHOSLO) 667 MG capsule, Take 1 capsule (667 mg total) by mouth 3 (three) times daily with  meals., Disp: 90 capsule, Rfl: 5 .  calcium carbonate (TUMS) 500 MG chewable tablet, 2 tabs PO with each meal and at bedtime. (Patient not taking: Reported on 04/03/2017), Disp: 240 tablet, Rfl: 1 .  carvedilol (COREG) 3.125 MG tablet, Take 1 tablet (3.125 mg total) by mouth 2 (two) times daily with a meal., Disp: 60 tablet, Rfl: 11 .  cinacalcet (SENSIPAR) 60 MG tablet, Take 60 mg by mouth daily., Disp: , Rfl:  .  Cinnamon 500 MG capsule, Take 1,000 mg by mouth daily., Disp: , Rfl:  .  glucose blood test strip, USE AS INSTRUCTED, Disp: , Rfl: 12 .  polyethylene glycol powder (GLYCOLAX/MIRALAX) powder, DISSOLVE 1 CAPFUL  17 GRAMS  IN WATER ONCE A DAY AS NEEDED FOR CONSTIPATION, Disp: , Rfl: 6 .  sevelamer carbonate (RENVELA) 800 MG tablet, Take 2,400 mg by mouth 3 (three) times daily with meals. , Disp: , Rfl:  .  triamcinolone cream (KENALOG) 0.1 %, Apply 1 application topically 2 (two) times daily. (Patient not taking: Reported on 05/16/2017), Disp: 80 g, Rfl: 1 .  TRUEPLUS LANCETS 28G MISC, Use as directed, Disp: 100 each, Rfl: 1 .  VIAGRA 50 MG tablet, TAKE 1 TABLET BY MOUTH AS NEEDED FOR ERECTILE DYSFUNCTION, Disp: , Rfl: 10  No Known Allergies Review of Systems Objective:   Vitals:   12/02/17 0953  BP: 114/70  Pulse: 87  Resp: 16    General: Well developed, nourished, in no acute distress, alert  and oriented x3   Dermatological: Skin is warm, dry and supple bilateral. Nails x 10 are well maintained; remaining integument appears unremarkable at this time. There are no open sores, no preulcerative lesions, no rash or signs of infection present.  Vascular: Dorsalis Pedis artery and Posterior Tibial artery pedal pulses are 2/4 bilateral with immedate capillary fill time. Pedal hair growth present. No varicosities and no lower extremity edema present bilateral.   Neruologic: Grossly intact via light touch bilateral. Vibratory intact via tuning fork bilateral. Protective threshold with  Semmes Wienstein monofilament intact to all pedal sites bilateral. Patellar and Achilles deep tendon reflexes 2+ bilateral. No Babinski or clonus noted bilateral.   Musculoskeletal: No gross boney pedal deformities bilateral. No pain, crepitus, or limitation noted with foot and ankle range of motion bilateral. Muscular strength 5/5 in all groups tested bilateral.  Gait: Unassisted, Nonantalgic.    Radiographs:  3 views of the bilateral foot demonstrates severe pes planus bilaterally plantar distally oriented calcaneal heel spurs proximally posterior calcaneal heel spurs soft tissue increase in density of plantar fascial calcaneal insertion site and the Achilles insertion sites.  No fractures are identified.  Assessment & Plan:   Assessment: Diabetes with diabetic peripheral neuropathy pes planus plantar fasciitis and Achilles tendinitis.  Plan: Discussed etiology pathology conservative versus surgical therapies.  At this point I like to get him into a pair of diabetic shoes immediately.  I went ahead and injected his bilateral heels with 20 mg Kenalog 5 mg Marcaine point maximal tenderness medial aspect of the bilateral heel after sterile Betadine skin prep.  We did not address the Achilles tendon today hopefully the orthotics will do that.  I did not put him on any medication.  He is on dialysis.     Patrick Ibarra T. [place], [place]

## 2017-12-03 DIAGNOSIS — D631 Anemia in chronic kidney disease: Secondary | ICD-10-CM | POA: Diagnosis not present

## 2017-12-03 DIAGNOSIS — E1129 Type 2 diabetes mellitus with other diabetic kidney complication: Secondary | ICD-10-CM | POA: Diagnosis not present

## 2017-12-03 DIAGNOSIS — D509 Iron deficiency anemia, unspecified: Secondary | ICD-10-CM | POA: Diagnosis not present

## 2017-12-03 DIAGNOSIS — N186 End stage renal disease: Secondary | ICD-10-CM | POA: Diagnosis not present

## 2017-12-03 DIAGNOSIS — N2581 Secondary hyperparathyroidism of renal origin: Secondary | ICD-10-CM | POA: Diagnosis not present

## 2017-12-05 DIAGNOSIS — D509 Iron deficiency anemia, unspecified: Secondary | ICD-10-CM | POA: Diagnosis not present

## 2017-12-05 DIAGNOSIS — N186 End stage renal disease: Secondary | ICD-10-CM | POA: Diagnosis not present

## 2017-12-05 DIAGNOSIS — E1129 Type 2 diabetes mellitus with other diabetic kidney complication: Secondary | ICD-10-CM | POA: Diagnosis not present

## 2017-12-05 DIAGNOSIS — D631 Anemia in chronic kidney disease: Secondary | ICD-10-CM | POA: Diagnosis not present

## 2017-12-05 DIAGNOSIS — N2581 Secondary hyperparathyroidism of renal origin: Secondary | ICD-10-CM | POA: Diagnosis not present

## 2017-12-08 ENCOUNTER — Telehealth: Payer: Self-pay | Admitting: Internal Medicine

## 2017-12-08 DIAGNOSIS — N2581 Secondary hyperparathyroidism of renal origin: Secondary | ICD-10-CM | POA: Diagnosis not present

## 2017-12-08 DIAGNOSIS — D631 Anemia in chronic kidney disease: Secondary | ICD-10-CM | POA: Diagnosis not present

## 2017-12-08 DIAGNOSIS — N186 End stage renal disease: Secondary | ICD-10-CM | POA: Diagnosis not present

## 2017-12-08 DIAGNOSIS — E1129 Type 2 diabetes mellitus with other diabetic kidney complication: Secondary | ICD-10-CM | POA: Diagnosis not present

## 2017-12-08 DIAGNOSIS — D509 Iron deficiency anemia, unspecified: Secondary | ICD-10-CM | POA: Diagnosis not present

## 2017-12-08 NOTE — Telephone Encounter (Signed)
3 page, paperwork received through fax 12-08-17.

## 2017-12-10 DIAGNOSIS — E1129 Type 2 diabetes mellitus with other diabetic kidney complication: Secondary | ICD-10-CM | POA: Diagnosis not present

## 2017-12-10 DIAGNOSIS — N2581 Secondary hyperparathyroidism of renal origin: Secondary | ICD-10-CM | POA: Diagnosis not present

## 2017-12-10 DIAGNOSIS — D509 Iron deficiency anemia, unspecified: Secondary | ICD-10-CM | POA: Diagnosis not present

## 2017-12-10 DIAGNOSIS — D631 Anemia in chronic kidney disease: Secondary | ICD-10-CM | POA: Diagnosis not present

## 2017-12-10 DIAGNOSIS — N186 End stage renal disease: Secondary | ICD-10-CM | POA: Diagnosis not present

## 2017-12-12 DIAGNOSIS — D631 Anemia in chronic kidney disease: Secondary | ICD-10-CM | POA: Diagnosis not present

## 2017-12-12 DIAGNOSIS — N186 End stage renal disease: Secondary | ICD-10-CM | POA: Diagnosis not present

## 2017-12-12 DIAGNOSIS — N2581 Secondary hyperparathyroidism of renal origin: Secondary | ICD-10-CM | POA: Diagnosis not present

## 2017-12-12 DIAGNOSIS — D509 Iron deficiency anemia, unspecified: Secondary | ICD-10-CM | POA: Diagnosis not present

## 2017-12-12 DIAGNOSIS — E1129 Type 2 diabetes mellitus with other diabetic kidney complication: Secondary | ICD-10-CM | POA: Diagnosis not present

## 2017-12-15 DIAGNOSIS — N2581 Secondary hyperparathyroidism of renal origin: Secondary | ICD-10-CM | POA: Diagnosis not present

## 2017-12-15 DIAGNOSIS — D509 Iron deficiency anemia, unspecified: Secondary | ICD-10-CM | POA: Diagnosis not present

## 2017-12-15 DIAGNOSIS — D631 Anemia in chronic kidney disease: Secondary | ICD-10-CM | POA: Diagnosis not present

## 2017-12-15 DIAGNOSIS — E1129 Type 2 diabetes mellitus with other diabetic kidney complication: Secondary | ICD-10-CM | POA: Diagnosis not present

## 2017-12-15 DIAGNOSIS — N186 End stage renal disease: Secondary | ICD-10-CM | POA: Diagnosis not present

## 2017-12-17 DIAGNOSIS — D631 Anemia in chronic kidney disease: Secondary | ICD-10-CM | POA: Diagnosis not present

## 2017-12-17 DIAGNOSIS — N2581 Secondary hyperparathyroidism of renal origin: Secondary | ICD-10-CM | POA: Diagnosis not present

## 2017-12-17 DIAGNOSIS — N186 End stage renal disease: Secondary | ICD-10-CM | POA: Diagnosis not present

## 2017-12-17 DIAGNOSIS — E1129 Type 2 diabetes mellitus with other diabetic kidney complication: Secondary | ICD-10-CM | POA: Diagnosis not present

## 2017-12-17 DIAGNOSIS — D509 Iron deficiency anemia, unspecified: Secondary | ICD-10-CM | POA: Diagnosis not present

## 2017-12-19 DIAGNOSIS — D509 Iron deficiency anemia, unspecified: Secondary | ICD-10-CM | POA: Diagnosis not present

## 2017-12-19 DIAGNOSIS — N186 End stage renal disease: Secondary | ICD-10-CM | POA: Diagnosis not present

## 2017-12-19 DIAGNOSIS — D631 Anemia in chronic kidney disease: Secondary | ICD-10-CM | POA: Diagnosis not present

## 2017-12-19 DIAGNOSIS — E1129 Type 2 diabetes mellitus with other diabetic kidney complication: Secondary | ICD-10-CM | POA: Diagnosis not present

## 2017-12-19 DIAGNOSIS — N2581 Secondary hyperparathyroidism of renal origin: Secondary | ICD-10-CM | POA: Diagnosis not present

## 2017-12-24 DIAGNOSIS — N2581 Secondary hyperparathyroidism of renal origin: Secondary | ICD-10-CM | POA: Diagnosis not present

## 2017-12-24 DIAGNOSIS — D631 Anemia in chronic kidney disease: Secondary | ICD-10-CM | POA: Diagnosis not present

## 2017-12-24 DIAGNOSIS — N186 End stage renal disease: Secondary | ICD-10-CM | POA: Diagnosis not present

## 2017-12-24 DIAGNOSIS — D509 Iron deficiency anemia, unspecified: Secondary | ICD-10-CM | POA: Diagnosis not present

## 2017-12-24 DIAGNOSIS — E1129 Type 2 diabetes mellitus with other diabetic kidney complication: Secondary | ICD-10-CM | POA: Diagnosis not present

## 2017-12-26 DIAGNOSIS — E1129 Type 2 diabetes mellitus with other diabetic kidney complication: Secondary | ICD-10-CM | POA: Diagnosis not present

## 2017-12-26 DIAGNOSIS — D631 Anemia in chronic kidney disease: Secondary | ICD-10-CM | POA: Diagnosis not present

## 2017-12-26 DIAGNOSIS — D509 Iron deficiency anemia, unspecified: Secondary | ICD-10-CM | POA: Diagnosis not present

## 2017-12-26 DIAGNOSIS — N2581 Secondary hyperparathyroidism of renal origin: Secondary | ICD-10-CM | POA: Diagnosis not present

## 2017-12-26 DIAGNOSIS — N186 End stage renal disease: Secondary | ICD-10-CM | POA: Diagnosis not present

## 2017-12-29 DIAGNOSIS — N2581 Secondary hyperparathyroidism of renal origin: Secondary | ICD-10-CM | POA: Diagnosis not present

## 2017-12-29 DIAGNOSIS — E1129 Type 2 diabetes mellitus with other diabetic kidney complication: Secondary | ICD-10-CM | POA: Diagnosis not present

## 2017-12-29 DIAGNOSIS — D631 Anemia in chronic kidney disease: Secondary | ICD-10-CM | POA: Diagnosis not present

## 2017-12-29 DIAGNOSIS — D509 Iron deficiency anemia, unspecified: Secondary | ICD-10-CM | POA: Diagnosis not present

## 2017-12-29 DIAGNOSIS — N186 End stage renal disease: Secondary | ICD-10-CM | POA: Diagnosis not present

## 2017-12-31 ENCOUNTER — Emergency Department (HOSPITAL_COMMUNITY): Payer: Medicare Other

## 2017-12-31 ENCOUNTER — Emergency Department (HOSPITAL_COMMUNITY)
Admission: EM | Admit: 2017-12-31 | Discharge: 2017-12-31 | Disposition: A | Discharge status: Home / Self Care | Payer: Medicare Other | Attending: Emergency Medicine | Admitting: Emergency Medicine

## 2017-12-31 ENCOUNTER — Encounter (HOSPITAL_COMMUNITY): Payer: Self-pay

## 2017-12-31 ENCOUNTER — Other Ambulatory Visit: Payer: Self-pay

## 2017-12-31 DIAGNOSIS — N186 End stage renal disease: Secondary | ICD-10-CM | POA: Diagnosis not present

## 2017-12-31 DIAGNOSIS — Z79899 Other long term (current) drug therapy: Secondary | ICD-10-CM | POA: Insufficient documentation

## 2017-12-31 DIAGNOSIS — E1122 Type 2 diabetes mellitus with diabetic chronic kidney disease: Secondary | ICD-10-CM | POA: Diagnosis not present

## 2017-12-31 DIAGNOSIS — N2581 Secondary hyperparathyroidism of renal origin: Secondary | ICD-10-CM | POA: Diagnosis not present

## 2017-12-31 DIAGNOSIS — R55 Syncope and collapse: Secondary | ICD-10-CM

## 2017-12-31 DIAGNOSIS — T82858A Stenosis of vascular prosthetic devices, implants and grafts, initial encounter: Secondary | ICD-10-CM | POA: Diagnosis not present

## 2017-12-31 DIAGNOSIS — I951 Orthostatic hypotension: Secondary | ICD-10-CM | POA: Diagnosis not present

## 2017-12-31 DIAGNOSIS — D631 Anemia in chronic kidney disease: Secondary | ICD-10-CM | POA: Diagnosis not present

## 2017-12-31 DIAGNOSIS — E118 Type 2 diabetes mellitus with unspecified complications: Secondary | ICD-10-CM | POA: Diagnosis not present

## 2017-12-31 DIAGNOSIS — R404 Transient alteration of awareness: Secondary | ICD-10-CM | POA: Diagnosis not present

## 2017-12-31 DIAGNOSIS — I509 Heart failure, unspecified: Secondary | ICD-10-CM | POA: Diagnosis not present

## 2017-12-31 DIAGNOSIS — M722 Plantar fascial fibromatosis: Secondary | ICD-10-CM | POA: Diagnosis not present

## 2017-12-31 DIAGNOSIS — I953 Hypotension of hemodialysis: Secondary | ICD-10-CM | POA: Diagnosis not present

## 2017-12-31 DIAGNOSIS — I132 Hypertensive heart and chronic kidney disease with heart failure and with stage 5 chronic kidney disease, or end stage renal disease: Secondary | ICD-10-CM | POA: Diagnosis not present

## 2017-12-31 DIAGNOSIS — D509 Iron deficiency anemia, unspecified: Secondary | ICD-10-CM | POA: Diagnosis not present

## 2017-12-31 DIAGNOSIS — I129 Hypertensive chronic kidney disease with stage 1 through stage 4 chronic kidney disease, or unspecified chronic kidney disease: Secondary | ICD-10-CM | POA: Diagnosis not present

## 2017-12-31 DIAGNOSIS — Z992 Dependence on renal dialysis: Secondary | ICD-10-CM | POA: Diagnosis not present

## 2017-12-31 DIAGNOSIS — I871 Compression of vein: Secondary | ICD-10-CM | POA: Diagnosis not present

## 2017-12-31 DIAGNOSIS — E1129 Type 2 diabetes mellitus with other diabetic kidney complication: Secondary | ICD-10-CM | POA: Diagnosis not present

## 2017-12-31 DIAGNOSIS — I10 Essential (primary) hypertension: Secondary | ICD-10-CM | POA: Diagnosis not present

## 2017-12-31 LAB — CBC
HCT: 39.4 % (ref 39.0–52.0)
Hemoglobin: 13.3 g/dL (ref 13.0–17.0)
MCH: 30.4 pg (ref 26.0–34.0)
MCHC: 33.8 g/dL (ref 30.0–36.0)
MCV: 90 fL (ref 78.0–100.0)
PLATELETS: 253 10*3/uL (ref 150–400)
RBC: 4.38 MIL/uL (ref 4.22–5.81)
RDW: 14.9 % (ref 11.5–15.5)
WBC: 7.5 10*3/uL (ref 4.0–10.5)

## 2017-12-31 LAB — BASIC METABOLIC PANEL
Anion gap: 19 — ABNORMAL HIGH (ref 5–15)
BUN: 24 mg/dL — ABNORMAL HIGH (ref 6–20)
CALCIUM: 8.6 mg/dL — AB (ref 8.9–10.3)
CO2: 27 mmol/L (ref 22–32)
CREATININE: 8.09 mg/dL — AB (ref 0.61–1.24)
Chloride: 91 mmol/L — ABNORMAL LOW (ref 101–111)
GFR calc Af Amer: 9 mL/min — ABNORMAL LOW (ref >60)
GFR, EST NON AFRICAN AMERICAN: 8 mL/min — AB (ref >60)
GLUCOSE: 115 mg/dL — AB (ref 65–99)
Potassium: 3.7 mmol/L (ref 3.5–5.1)
SODIUM: 137 mmol/L (ref 135–145)

## 2017-12-31 LAB — I-STAT TROPONIN, ED
TROPONIN I, POC: 0.02 ng/mL (ref 0.00–0.08)
Troponin i, poc: 0.04 ng/mL (ref 0.00–0.08)
Troponin i, poc: 0 ng/mL (ref 0.00–0.08)

## 2017-12-31 MED ORDER — SODIUM CHLORIDE 0.9 % IV BOLUS
500.0000 mL | Freq: Once | INTRAVENOUS | Status: AC
  Administered 2017-12-31: 500 mL via INTRAVENOUS

## 2017-12-31 NOTE — Discharge Instructions (Addendum)
You likely passed out too much fluid taken off dialysis, as your blood pressure dropped when standing here in the ED, this was resolved with fluids and the rest of your work-up is very reassuring.  Please resume your normal dialysis schedule and follow-up with your primary care doctor.  Return to the ED for further syncopal episodes, chest pain, shortness of breath or any other new or concerning symptoms.

## 2017-12-31 NOTE — ED Triage Notes (Signed)
Pt arrives to ED from dialysis center after completion of treatment, pulling 4L off with complaints of syncopal episode lasting approx 4 mins while pt waited for ride in the sun. EMS reports pt had abnormal ekg with potential st elevation. Pt has no cardiac hx, alert and oriented upon arrival. Pt placed in position of comfort with bed locked and lowered, call bell in reach.

## 2017-12-31 NOTE — ED Notes (Signed)
Pt O2 prior to ambulation- 100% RA Pt O2 during ambulation- 100% RA

## 2017-12-31 NOTE — ED Provider Notes (Signed)
[facility] EMERGENCY DEPARTMENT Provider Note   CSN: [id number] Arrival date & time: 12/31/17  1754     History   Chief Complaint Chief Complaint  Patient presents with  . Loss of Consciousness  . Abnormal ECG    HPI Patrick Ibarra is a 37 y.o. male.  Patrick Ibarra is a 37 y.o. Male history of ESRD on Monday Wednesday Friday dialysis, diabetes, hypertension, CHF, who presents to the ED from dialysis after having a syncopal episode lasting approximately 4 minutes.  Patient had completed his dialysis treatment had 4 L taken off in dialysis today, without complications.  Patient has history of heart failure, but does not have any history of ACS.  Patient reports he was sitting outside in the sun to warm up after dialysis evaluated for his ride, he started to get hot and sweaty and so he got up to walk over to a bench in the state and after sitting down he started to feel very lightheaded and then gases he passed out he was found by staff who estimate he was out for approximately 4 minutes.  Patient denies any preceding chest pain or shortness of breath.  He does report one episode of vomiting afterwards.  Patient denies any prior history of syncopal episodes after dialysis he has passed out a few times during.  Patient reports he thinks his blood pressure just dropped very low.  He denies any headache, vision changes or neck pain.  No evidence of head trauma from syncope.  No abdominal pain.  Patient reports his dry weight is approximately 230 pounds.     Past Medical History:  Diagnosis Date  . Hypertension   . LV dysfunction   . Obesity     Patient Active Problem List   Diagnosis Date Noted  . Pre-transplant evaluation for kidney transplant 09/05/2017  . Diabetes mellitus type II, uncontrolled ([place]) 09/04/2017  . Mallory-Weiss tear 09/04/2017  . Reactive depression 09/04/2017  . Pancreatitis, acute 04/04/2017  . GI bleed 04/03/2017  . Anemia of chronic disease  04/03/2017  . CHF (congestive heart failure) ([place]) 03/01/2017  . Obesity 03/01/2017  . Anemia of chronic kidney failure 03/01/2017  . Essential hypertension 01/05/2014  . Diabetes mellitus ([place]) 12/24/2011  . ESRD (end stage renal disease) ([place]) 12/24/2011    Past Surgical History:  Procedure Laterality Date  . AV FISTULA PLACEMENT Left 04/10/2017   Procedure: ARTERIOVENOUS (AV) FISTULA CREATION LEFT ARM;  Surgeon: Conrad Stanwood, MD;  Location: [facility];  Service: Vascular;  Laterality: Left;  . ESOPHAGOGASTRODUODENOSCOPY (EGD) WITH PROPOFOL N/A 04/04/2017   Procedure: ESOPHAGOGASTRODUODENOSCOPY (EGD) WITH PROPOFOL;  Surgeon: Carol Ada, MD;  Location: [facility];  Service: Endoscopy;  Laterality: N/A;  . INSERTION OF DIALYSIS CATHETER Right 04/10/2017   Procedure: INSERTION OF DIALYSIS CATHETER - RIGHT INTERNAL JUGULAR PLACEMENT;  Surgeon: Conrad Wallburg, MD;  Location: [facility];  Service: Vascular;  Laterality: Right;  . TONSILLECTOMY AND ADENOIDECTOMY          Home Medications    Prior to Admission medications   Medication Sig Start Date End Date Taking? Authorizing Provider  ACCU-CHEK SOFTCLIX LANCETS lancets Use as instructed to check blood sugar once daily. E11.9 11/11/17   Ladell Pier, MD  amLODipine (NORVASC) 10 MG tablet Take 1 tablet (10 mg total) by mouth daily. 05/30/17   Ladell Pier, MD  Blood Glucose Monitoring Suppl (ACCU-CHEK AVIVA PLUS) w/Device KIT Use as instructed to check blood sugar once  daily. E11.9 11/06/17   Ladell Pier, MD  calcium acetate (PHOSLO) 667 MG capsule Take 1 capsule (667 mg total) by mouth 3 (three) times daily with meals. 06/09/17   Ladell Pier, MD  calcium carbonate (TUMS) 500 MG chewable tablet 2 tabs PO with each meal and at bedtime. Patient not taking: Reported on 04/03/2017 03/19/17   Ladell Pier, MD  carvedilol (COREG) 3.125 MG tablet Take 1 tablet (3.125 mg total) by mouth 2 (two) times daily with a meal. 09/08/17    Ladell Pier, MD  cinacalcet (SENSIPAR) 60 MG tablet Take 60 mg by mouth daily.    [provider]  Cinnamon 500 MG capsule Take 1,000 mg by mouth daily.    [provider]  glucose blood test strip USE AS INSTRUCTED 11/03/17   [provider]  polyethylene glycol powder (GLYCOLAX/MIRALAX) powder DISSOLVE 1 CAPFUL  17 GRAMS  IN WATER ONCE A DAY AS NEEDED FOR CONSTIPATION 09/19/17   [provider]  sevelamer carbonate (RENVELA) 800 MG tablet Take 2,400 mg by mouth 3 (three) times daily with meals.     [provider]  triamcinolone cream (KENALOG) 0.1 % Apply 1 application topically 2 (two) times daily. Patient not taking: Reported on 05/16/2017 03/18/17   Ladell Pier, MD  TRUEPLUS LANCETS 28G MISC Use as directed 03/18/17   Ladell Pier, MD  VIAGRA 50 MG tablet TAKE 1 TABLET BY MOUTH AS NEEDED FOR ERECTILE DYSFUNCTION 10/02/17   [provider]    Family History Family History  Problem Relation Age of Onset  . Hypertension Mother   . Diabetes Mother   . Hypertension Father     Social History Social History   Tobacco Use  . Smoking status: Never Smoker  . Smokeless tobacco: Never Used  Substance Use Topics  . Alcohol use: Yes    Comment: WINE ONCE A WEEK  . Drug use: No     Allergies   Patient has no known allergies.   Review of Systems Review of Systems  Constitutional: Negative for chills and fever.  HENT: Negative for congestion, rhinorrhea and sore throat.   Eyes: Negative for photophobia and visual disturbance.  Respiratory: Negative for cough and shortness of breath.   Cardiovascular: Negative for chest pain, palpitations and leg swelling.  Gastrointestinal: Negative for abdominal pain, diarrhea, nausea and vomiting.  Genitourinary: Negative for dysuria and frequency.  Musculoskeletal: Negative for arthralgias, back pain, myalgias and neck pain.  Skin: Negative for color change, rash and wound.    Neurological: Positive for syncope and light-headedness. Negative for dizziness, weakness, numbness and headaches.     Physical Exam Updated Vital Signs Ht '6\' 1"'  (1.854 m)   Wt 104.3 kg (230 lb)   BMI 30.34 kg/m   Physical Exam  Constitutional: He is oriented to person, place, and time. He appears well-developed and well-nourished. No distress.  HENT:  Head: Normocephalic and atraumatic.  Mouth/Throat: Oropharynx is clear and moist.  Eyes: Pupils are equal, round, and reactive to light. EOM are normal. Right eye exhibits no discharge. Left eye exhibits no discharge.  No nystagmus  Neck: Normal range of motion. Neck supple. No tracheal deviation present.  Cardiovascular: Regular rhythm, normal heart sounds and intact distal pulses.  Mild tachycardia  Pulmonary/Chest: Effort normal and breath sounds normal. No stridor. No respiratory distress. He has no wheezes. He has no rales.  Respirations equal and unlabored, patient able to speak in full sentences, lungs clear  to auscultation bilaterally  Abdominal: Soft. Bowel sounds are normal. He exhibits no distension and no mass. There is no tenderness. There is no guarding.  Musculoskeletal: He exhibits no edema or deformity.  Lymphadenopathy:    He has no cervical adenopathy.  Neurological: He is alert and oriented to person, place, and time. Coordination normal.  Speech is clear, able to follow commands CN III-XII intact Normal strength in upper and lower extremities bilaterally including dorsiflexion and plantar flexion, strong and equal grip strength Sensation normal to light and sharp touch Moves extremities without ataxia, coordination intact  Skin: Skin is warm and dry. Capillary refill takes less than 2 seconds. He is not diaphoretic.  Psychiatric: He has a normal mood and affect. His behavior is normal.  Nursing note and vitals reviewed.    ED Treatments / Results  Labs (all labs ordered are listed, but only abnormal  results are displayed) Labs Reviewed  BASIC METABOLIC PANEL - Abnormal; Notable for the following components:      Result Value   Chloride 91 (*)    Glucose, Bld 115 (*)    BUN 24 (*)    Creatinine, Ser 8.09 (*)    Calcium 8.6 (*)    GFR calc non Af Amer 8 (*)    GFR calc Af Amer 9 (*)    Anion gap 19 (*)    All other components within normal limits  CBC  I-STAT TROPONIN, ED  I-STAT TROPONIN, ED    EKG EKG Interpretation  Date/Time:  Wednesday Dec 31 2017 18:27:37 EDT Ventricular Rate:  105 PR Interval:    QRS Duration: 80 QT Interval:  332 QTC Calculation: 439 R Axis:   53 Text Interpretation:  Sinus tachycardia Nonspecific T abnormalities, inferior leads NO STEMI Confirmed by Addison Lank [phone]) on 12/31/2017 8:28:09 PM   Radiology Dg Chest 1 View  Result Date: 12/31/2017 CLINICAL DATA:  37 year old male with a history syncope EXAM: CHEST  1 VIEW COMPARISON:  04/10/2017 FINDINGS: Cardiomediastinal silhouette within normal limits. No evidence of central vascular congestion. No pneumothorax or pleural effusion. Compared to the prior plain film there has been removal of the dialysis catheter. No confluent airspace disease. No displaced fracture IMPRESSION: No radiographic evidence of acute cardiopulmonary disease. Interval removal of the dialysis catheter. Electronically Signed   By: Corrie Mckusick D.O.   On: 12/31/2017 18:59    Procedures Procedures (including critical care time)  Medications Ordered in ED Medications  sodium chloride 0.9 % bolus 500 mL (0 mLs Intravenous Stopped 12/31/17 2045)     Initial Impression / Assessment and Plan / ED Course  I have reviewed the triage vital signs and the nursing notes.  Pertinent labs & imaging results that were available during my care of the patient were reviewed by me and considered in my medical decision making (see chart for details).  Presents via EMS from after he had a syncopal episode outside after his dialysis  session ended.  Patient denies any pre-or post syncopal chest pain or shortness of breath.  Reports he was right at his dry weight leaving dialysis today and they took off at least 4 L of fluid.  Patient reports he was standing outside in the sun and walking and started to feel a bit lightheaded he sat down the past out.  Is mildly tachycardic vitals are otherwise normal, but has profound orthostatic hypotension when lying patient is a pressure is 108/74, which drops to 78/63 upon standing patient becomes tachycardic into  the 120s.  We will get basic labs, troponin, chest x-ray and EKG, will give 500 mL fluid bolus.  EKG shows sinus tachycardia with some nonspecific T wave abnormalities, patient continues to deny chest pain.  Initial troponin is negative.  Chest x-ray shows no active cardiopulmonary disease.  No leukocytosis, normal hemoglobin, no acute electrolyte derangements requiring intervention, kidney function as expected for ESRD patient.  Delta troponin negative as well.  Patient is no longer orthostatic and is ambulatory in the department without any lightheadedness, maintains normal vitals.  At this time patient is stable for discharge home also scheduled home medications.  Return precautions discussed.  Patient expressed understanding and is in agreement with plan.  Patient discussed with Dr. Jeris Penta who is in agreement with plan.  Final Clinical Impressions(s) / ED Diagnoses   Final diagnoses:  Syncope  Orthostatic hypotension  ESRD (end stage renal disease) on dialysis [facility])    ED Discharge Orders    None       Janet Berlin 12/31/17 2216    Fatima Blank, MD 01/01/18 1334

## 2017-12-31 NOTE — ED Notes (Signed)
Patient transported to X-ray 

## 2018-01-01 ENCOUNTER — Ambulatory Visit (INDEPENDENT_AMBULATORY_CARE_PROVIDER_SITE_OTHER): Payer: Medicare Other | Admitting: Podiatry

## 2018-01-01 ENCOUNTER — Ambulatory Visit: Payer: Medicare Other | Attending: Internal Medicine | Admitting: Internal Medicine

## 2018-01-01 ENCOUNTER — Ambulatory Visit (INDEPENDENT_AMBULATORY_CARE_PROVIDER_SITE_OTHER): Payer: Self-pay | Admitting: Orthotics

## 2018-01-01 ENCOUNTER — Encounter: Payer: Self-pay | Admitting: Internal Medicine

## 2018-01-01 VITALS — BP 127/79 | HR 112 | Temp 98.6°F | Resp 16 | Wt 228.4 lb

## 2018-01-01 DIAGNOSIS — N186 End stage renal disease: Secondary | ICD-10-CM | POA: Diagnosis not present

## 2018-01-01 DIAGNOSIS — I509 Heart failure, unspecified: Secondary | ICD-10-CM | POA: Insufficient documentation

## 2018-01-01 DIAGNOSIS — M722 Plantar fascial fibromatosis: Secondary | ICD-10-CM

## 2018-01-01 DIAGNOSIS — E11649 Type 2 diabetes mellitus with hypoglycemia without coma: Secondary | ICD-10-CM | POA: Diagnosis not present

## 2018-01-01 DIAGNOSIS — E118 Type 2 diabetes mellitus with unspecified complications: Secondary | ICD-10-CM | POA: Diagnosis not present

## 2018-01-01 DIAGNOSIS — E1122 Type 2 diabetes mellitus with diabetic chronic kidney disease: Secondary | ICD-10-CM | POA: Insufficient documentation

## 2018-01-01 DIAGNOSIS — R55 Syncope and collapse: Secondary | ICD-10-CM | POA: Diagnosis not present

## 2018-01-01 DIAGNOSIS — I953 Hypotension of hemodialysis: Secondary | ICD-10-CM

## 2018-01-01 DIAGNOSIS — I10 Essential (primary) hypertension: Secondary | ICD-10-CM

## 2018-01-01 DIAGNOSIS — Z992 Dependence on renal dialysis: Secondary | ICD-10-CM | POA: Diagnosis not present

## 2018-01-01 DIAGNOSIS — Z79899 Other long term (current) drug therapy: Secondary | ICD-10-CM | POA: Diagnosis not present

## 2018-01-01 DIAGNOSIS — D631 Anemia in chronic kidney disease: Secondary | ICD-10-CM | POA: Insufficient documentation

## 2018-01-01 DIAGNOSIS — T82858A Stenosis of vascular prosthetic devices, implants and grafts, initial encounter: Secondary | ICD-10-CM | POA: Diagnosis not present

## 2018-01-01 DIAGNOSIS — Z8249 Family history of ischemic heart disease and other diseases of the circulatory system: Secondary | ICD-10-CM | POA: Insufficient documentation

## 2018-01-01 DIAGNOSIS — I871 Compression of vein: Secondary | ICD-10-CM | POA: Diagnosis not present

## 2018-01-01 DIAGNOSIS — Z9889 Other specified postprocedural states: Secondary | ICD-10-CM | POA: Insufficient documentation

## 2018-01-01 DIAGNOSIS — I132 Hypertensive heart and chronic kidney disease with heart failure and with stage 5 chronic kidney disease, or end stage renal disease: Secondary | ICD-10-CM | POA: Diagnosis not present

## 2018-01-01 LAB — GLUCOSE, POCT (MANUAL RESULT ENTRY): POC Glucose: 153 mg/dl — AB (ref 70–99)

## 2018-01-01 MED ORDER — AMLODIPINE BESYLATE 10 MG PO TABS: 5.0000 mg | ORAL_TABLET | Freq: Every day | ORAL | 11 refills | Status: DC

## 2018-01-01 MED FILL — $Viagra 50mg tablet: 50 | 30 days supply | Qty: 10 | Fill #3

## 2018-01-01 MED FILL — CARVEDILOL 3.125 MG TABLET: 3.125 | 30 days supply | Qty: 60 | Fill #3

## 2018-01-01 MED FILL — SEVELAMER CARBONATE 800 MG: 800 | 30 days supply | Qty: 390 | Fill #3

## 2018-01-01 NOTE — Progress Notes (Signed)
Pt wife is wanting to know if he should continue to be on both bp medications because lately he has been getting lightheaded

## 2018-01-01 NOTE — Patient Instructions (Signed)
Decrease Amlodipine to 10 mg 1/2 tab daily.  If Blood pressure continue to run low despite decrease dose, then discontinue the Amlodipine.

## 2018-01-01 NOTE — Progress Notes (Signed)
Patient ID: Patrick Ibarra, male    DOB: April 23, 1981  MRN: [id number]  CC: Hypertension and Diabetes   Subjective: Patrick Ibarra is a 37 y.o. male who presents for chronic ds management.  Wife is with him His concerns today include:   ESRD/HTN:  Had syncopye episode yesterday post HD.  BP was a little low postdialysis.  He was sitting on bench in the sun waiting for his wife to pick him up after HD.  Started sweating so got up to go to a bench in he shade.  He felt dizzy, no palpitations and fell back down on the bench and had a syncopal episode.  He was seen by 1 of the employees in the HD center who came out to assist him.  It is thought that he was out for about 4 minutes.  No loss of bowel or bladder function.  Patient was seen in the ER.  EKG reveals some sinus tachycardia with no acute changes.  Chest x-ray revealed no acute cardiopulmonary disease.  CBC revealed hemoglobin of 13.  Checked on BMP was normal -on Coreg and Norvasc.  Takes Norvasc and Coreg after HD and, 2nd dose of Coreg at bedtime -dizziness sometimes with positions changes from sititing to standing -checks BP on non-HD days.  Wife gives range of 117-120/89-90s.   2.  Has a form from [facility] transplant that needs to be completed.  Has to come up with $2500 to get on the active list.  They are working on apply for charity for this.   3. DM:  Checks BS daily.  Range 110 range. Recently call for eye exam which he has to schedule Patient Active Problem List   Diagnosis Date Noted  . Pre-transplant evaluation for kidney transplant 09/05/2017  . Diabetes mellitus type II, uncontrolled ([place]) 09/04/2017  . Mallory-Weiss tear 09/04/2017  . Reactive depression 09/04/2017  . Pancreatitis, acute 04/04/2017  . GI bleed 04/03/2017  . Anemia of chronic disease 04/03/2017  . CHF (congestive heart failure) ([place]) 03/01/2017  . Obesity 03/01/2017  . Anemia of chronic kidney failure 03/01/2017  . Essential hypertension 01/05/2014  .  Diabetes mellitus ([place]) 12/24/2011  . ESRD (end stage renal disease) ([place]) 12/24/2011     Current Outpatient Medications on File Prior to Visit  Medication Sig Dispense Refill  . ACCU-CHEK SOFTCLIX LANCETS lancets Use as instructed to check blood sugar once daily. E11.9 100 each 12  . Blood Glucose Monitoring Suppl (ACCU-CHEK AVIVA PLUS) w/Device KIT Use as instructed to check blood sugar once daily. E11.9 1 kit 0  . calcium acetate (PHOSLO) 667 MG capsule Take 1 capsule (667 mg total) by mouth 3 (three) times daily with meals. 90 capsule 5  . calcium carbonate (TUMS) 500 MG chewable tablet 2 tabs PO with each meal and at bedtime. (Patient not taking: Reported on 04/03/2017) 240 tablet 1  . carvedilol (COREG) 3.125 MG tablet Take 1 tablet (3.125 mg total) by mouth 2 (two) times daily with a meal. 60 tablet 11  . cinacalcet (SENSIPAR) 60 MG tablet Take 60 mg by mouth daily.    . Cinnamon 500 MG capsule Take 1,000 mg by mouth daily.    Marland Kitchen glucose blood test strip USE AS INSTRUCTED  12  . polyethylene glycol powder (GLYCOLAX/MIRALAX) powder DISSOLVE 1 CAPFUL  17 GRAMS  IN WATER ONCE A DAY AS NEEDED FOR CONSTIPATION  6  . sevelamer carbonate (RENVELA) 800 MG tablet Take 2,400 mg by mouth 3 (three) times  daily with meals.     . triamcinolone cream (KENALOG) 0.1 % Apply 1 application topically 2 (two) times daily. 80 g 1  . TRUEPLUS LANCETS 28G MISC Use as directed 100 each 1  . VIAGRA 50 MG tablet TAKE 1 TABLET BY MOUTH AS NEEDED FOR ERECTILE DYSFUNCTION  10   No current facility-administered medications on file prior to visit.     No Known Allergies  Social History   Socioeconomic History  . Marital status: Married    Spouse name: Not on file  . Number of children: Not on file  . Years of education: Not on file  . Highest education level: Not on file  Occupational History  . Not on file  Social Needs  . Financial resource strain: Not on file  . Food insecurity:    Worry: Not on file     Inability: Not on file  . Transportation needs:    Medical: Not on file    Non-medical: Not on file  Tobacco Use  . Smoking status: Never Smoker  . Smokeless tobacco: Never Used  Substance and Sexual Activity  . Alcohol use: Yes    Comment: WINE ONCE A WEEK  . Drug use: No  . Sexual activity: Not on file  Lifestyle  . Physical activity:    Days per week: Not on file    Minutes per session: Not on file  . Stress: Not on file  Relationships  . Social connections:    Talks on phone: Not on file    Gets together: Not on file    Attends religious service: Not on file    Active member of club or organization: Not on file    Attends meetings of clubs or organizations: Not on file    Relationship status: Not on file  . Intimate partner violence:    Fear of current or ex partner: Not on file    Emotionally abused: Not on file    Physically abused: Not on file    Forced sexual activity: Not on file  Other Topics Concern  . Not on file  Social History Narrative   He is married with step kids. He works at [employer].     Family History  Problem Relation Age of Onset  . Hypertension Mother   . Diabetes Mother   . Hypertension Father     Past Surgical History:  Procedure Laterality Date  . AV FISTULA PLACEMENT Left 04/10/2017   Procedure: ARTERIOVENOUS (AV) FISTULA CREATION LEFT ARM;  Surgeon: Conrad [place], MD;  Location: [facility];  Service: Vascular;  Laterality: Left;  . ESOPHAGOGASTRODUODENOSCOPY (EGD) WITH PROPOFOL N/A 04/04/2017   Procedure: ESOPHAGOGASTRODUODENOSCOPY (EGD) WITH PROPOFOL;  Surgeon: Carol Ada, MD;  Location: [facility];  Service: Endoscopy;  Laterality: N/A;  . INSERTION OF DIALYSIS CATHETER Right 04/10/2017   Procedure: INSERTION OF DIALYSIS CATHETER - RIGHT INTERNAL JUGULAR PLACEMENT;  Surgeon: Conrad [place], MD;  Location: [facility];  Service: Vascular;  Laterality: Right;  . TONSILLECTOMY AND ADENOIDECTOMY      ROS: Review of Systems Negative  except as stated above PHYSICAL EXAM: BP 127/79   Pulse (!) 112   Temp 98.6 F (37 C) (Oral)   Resp 16   Wt 228 lb 6.4 oz (103.6 kg)   SpO2 98%   BMI 30.13 kg/m   Wt Readings from Last 3 Encounters:  01/01/18 228 lb 6.4 oz (103.6 kg)  12/31/17 225 lb (102.1 kg)  10/21/17 229 lb (103.9  kg)    Physical Exam  General appearance - alert, well appearing, and in no distress Mental status - normal mood, behavior, speech, dress, motor activity, and thought processes Neck - supple, no significant adenopathy Chest - clear to auscultation, no wheezes, rales or rhonchi, symmetric air entry Heart -mild tachycardia but regular.  He has not taken carvedilol as yet for today. Extremities -no lower extremity edema.  BS 153 Lab Results  Component Value Date   HGBA1C 4.9 09/08/2017    ASSESSMENT AND PLAN: 1. ESRD (end stage renal disease) ([place]) -On hemodialysis.  Form completed for the [facility] transplant team.  2. Vasovagal syncope Sounds like he had a vasovagal syncope either to being overly hot or blood pressure being low postdialysis or combination of both. Advised to decrease Norvasc to 5 mg daily.-If he continues to feel dizzy with position changes and blood pressure remains okay, advised to stop amlodipine altogether  3. Hemodialysis-associated hypotension See #2 above  4. Type 2 diabetes mellitus with complication, without long-term current use of insulin (HCC) Well-controlled off of medications.  Continue to encourage healthy eating habits - POCT glucose (manual entry)  5. Essential hypertension - amLODipine (NORVASC) 10 MG tablet; Take 0.5 tablets (5 mg total) by mouth daily.  Dispense: 45 tablet; Refill: 11 .  Patient was given the opportunity to ask questions.  Patient verbalized understanding of the plan and was able to repeat key elements of the plan.   Orders Placed This Encounter  Procedures  . POCT glucose (manual entry)     Requested Prescriptions   Signed  Prescriptions Disp Refills  . amLODipine (NORVASC) 10 MG tablet 45 tablet 11    Sig: Take 0.5 tablets (5 mg total) by mouth daily.    Return in about 3 months (around 04/03/2018).  Karle Plumber, MD, FACP

## 2018-01-01 NOTE — Progress Notes (Signed)
He presents today for follow-up of plantar fasciitis bilateral.  He states that he seems to be doing much better still has some tenderness in the heels bilaterally.  Objective: Vital signs are stable alert and oriented x3.  Pulses are palpable.  Neurologic sensorium is intact.  Degenerative flexors are intact.  Muscle strength was 5/5 dorsiflexors plantar flexors inverters everters onto the musculature is intact.  Orthopedic evaluation demonstrates all joints distal ankle full range of motion without crepitation.  He has pes planus bilateral he has pain on palpation medial calcaneal tubercles bilateral.  Assessment: Severe has planus more rigid in nature with plantar fasciitis bilateral.  Plan: After sterile Betadine skin prep I injected 20 mg Kenalog 5 mg Marcaine to the point of maximal tenderness medial aspect of the bilateral heel at the plantar fascial calcaneal insertion sites.  He will follow-up with Liliane Channel today for orthotics.

## 2018-01-01 NOTE — Progress Notes (Signed)
Patient presents for diabetic shoe pick up, shoes are tried on for good fit.  Patient received 1 Pair and 3 pairs custom molded diabetic inserts.  Verbal and written break in and wear instructions given.  Patient will follow up for scheduled routine care.   

## 2018-01-02 DIAGNOSIS — D509 Iron deficiency anemia, unspecified: Secondary | ICD-10-CM | POA: Diagnosis not present

## 2018-01-02 DIAGNOSIS — N2581 Secondary hyperparathyroidism of renal origin: Secondary | ICD-10-CM | POA: Diagnosis not present

## 2018-01-02 DIAGNOSIS — N186 End stage renal disease: Secondary | ICD-10-CM | POA: Diagnosis not present

## 2018-01-02 DIAGNOSIS — D631 Anemia in chronic kidney disease: Secondary | ICD-10-CM | POA: Diagnosis not present

## 2018-01-02 DIAGNOSIS — E1129 Type 2 diabetes mellitus with other diabetic kidney complication: Secondary | ICD-10-CM | POA: Diagnosis not present

## 2018-01-02 MED FILL — AMLODIPINE BESYLATE 10 MG T: 10 | 30 days supply | Qty: 15 | Fill #0

## 2018-01-05 DIAGNOSIS — D631 Anemia in chronic kidney disease: Secondary | ICD-10-CM | POA: Diagnosis not present

## 2018-01-05 DIAGNOSIS — N2581 Secondary hyperparathyroidism of renal origin: Secondary | ICD-10-CM | POA: Diagnosis not present

## 2018-01-05 DIAGNOSIS — N186 End stage renal disease: Secondary | ICD-10-CM | POA: Diagnosis not present

## 2018-01-05 DIAGNOSIS — D509 Iron deficiency anemia, unspecified: Secondary | ICD-10-CM | POA: Diagnosis not present

## 2018-01-05 DIAGNOSIS — E1129 Type 2 diabetes mellitus with other diabetic kidney complication: Secondary | ICD-10-CM | POA: Diagnosis not present

## 2018-01-07 DIAGNOSIS — N186 End stage renal disease: Secondary | ICD-10-CM | POA: Diagnosis not present

## 2018-01-07 DIAGNOSIS — D631 Anemia in chronic kidney disease: Secondary | ICD-10-CM | POA: Diagnosis not present

## 2018-01-07 DIAGNOSIS — E1129 Type 2 diabetes mellitus with other diabetic kidney complication: Secondary | ICD-10-CM | POA: Diagnosis not present

## 2018-01-07 DIAGNOSIS — D509 Iron deficiency anemia, unspecified: Secondary | ICD-10-CM | POA: Diagnosis not present

## 2018-01-07 DIAGNOSIS — N2581 Secondary hyperparathyroidism of renal origin: Secondary | ICD-10-CM | POA: Diagnosis not present

## 2018-01-09 DIAGNOSIS — N2581 Secondary hyperparathyroidism of renal origin: Secondary | ICD-10-CM | POA: Diagnosis not present

## 2018-01-09 DIAGNOSIS — D631 Anemia in chronic kidney disease: Secondary | ICD-10-CM | POA: Diagnosis not present

## 2018-01-09 DIAGNOSIS — N186 End stage renal disease: Secondary | ICD-10-CM | POA: Diagnosis not present

## 2018-01-09 DIAGNOSIS — E1129 Type 2 diabetes mellitus with other diabetic kidney complication: Secondary | ICD-10-CM | POA: Diagnosis not present

## 2018-01-09 DIAGNOSIS — D509 Iron deficiency anemia, unspecified: Secondary | ICD-10-CM | POA: Diagnosis not present

## 2018-01-12 DIAGNOSIS — D509 Iron deficiency anemia, unspecified: Secondary | ICD-10-CM | POA: Diagnosis not present

## 2018-01-12 DIAGNOSIS — E1129 Type 2 diabetes mellitus with other diabetic kidney complication: Secondary | ICD-10-CM | POA: Diagnosis not present

## 2018-01-12 DIAGNOSIS — N186 End stage renal disease: Secondary | ICD-10-CM | POA: Diagnosis not present

## 2018-01-12 DIAGNOSIS — N2581 Secondary hyperparathyroidism of renal origin: Secondary | ICD-10-CM | POA: Diagnosis not present

## 2018-01-12 DIAGNOSIS — D631 Anemia in chronic kidney disease: Secondary | ICD-10-CM | POA: Diagnosis not present

## 2018-01-14 DIAGNOSIS — D631 Anemia in chronic kidney disease: Secondary | ICD-10-CM | POA: Diagnosis not present

## 2018-01-14 DIAGNOSIS — N2581 Secondary hyperparathyroidism of renal origin: Secondary | ICD-10-CM | POA: Diagnosis not present

## 2018-01-14 DIAGNOSIS — E1129 Type 2 diabetes mellitus with other diabetic kidney complication: Secondary | ICD-10-CM | POA: Diagnosis not present

## 2018-01-14 DIAGNOSIS — N186 End stage renal disease: Secondary | ICD-10-CM | POA: Diagnosis not present

## 2018-01-14 DIAGNOSIS — D509 Iron deficiency anemia, unspecified: Secondary | ICD-10-CM | POA: Diagnosis not present

## 2018-01-16 DIAGNOSIS — N2581 Secondary hyperparathyroidism of renal origin: Secondary | ICD-10-CM | POA: Diagnosis not present

## 2018-01-16 DIAGNOSIS — D631 Anemia in chronic kidney disease: Secondary | ICD-10-CM | POA: Diagnosis not present

## 2018-01-16 DIAGNOSIS — D509 Iron deficiency anemia, unspecified: Secondary | ICD-10-CM | POA: Diagnosis not present

## 2018-01-16 DIAGNOSIS — N186 End stage renal disease: Secondary | ICD-10-CM | POA: Diagnosis not present

## 2018-01-16 DIAGNOSIS — E1129 Type 2 diabetes mellitus with other diabetic kidney complication: Secondary | ICD-10-CM | POA: Diagnosis not present

## 2018-01-19 DIAGNOSIS — D631 Anemia in chronic kidney disease: Secondary | ICD-10-CM | POA: Diagnosis not present

## 2018-01-19 DIAGNOSIS — N2581 Secondary hyperparathyroidism of renal origin: Secondary | ICD-10-CM | POA: Diagnosis not present

## 2018-01-19 DIAGNOSIS — E1129 Type 2 diabetes mellitus with other diabetic kidney complication: Secondary | ICD-10-CM | POA: Diagnosis not present

## 2018-01-19 DIAGNOSIS — D509 Iron deficiency anemia, unspecified: Secondary | ICD-10-CM | POA: Diagnosis not present

## 2018-01-19 DIAGNOSIS — N186 End stage renal disease: Secondary | ICD-10-CM | POA: Diagnosis not present

## 2018-01-21 DIAGNOSIS — D509 Iron deficiency anemia, unspecified: Secondary | ICD-10-CM | POA: Diagnosis not present

## 2018-01-21 DIAGNOSIS — N2581 Secondary hyperparathyroidism of renal origin: Secondary | ICD-10-CM | POA: Diagnosis not present

## 2018-01-21 DIAGNOSIS — D631 Anemia in chronic kidney disease: Secondary | ICD-10-CM | POA: Diagnosis not present

## 2018-01-21 DIAGNOSIS — N186 End stage renal disease: Secondary | ICD-10-CM | POA: Diagnosis not present

## 2018-01-21 DIAGNOSIS — E1129 Type 2 diabetes mellitus with other diabetic kidney complication: Secondary | ICD-10-CM | POA: Diagnosis not present

## 2018-01-23 DIAGNOSIS — E1129 Type 2 diabetes mellitus with other diabetic kidney complication: Secondary | ICD-10-CM | POA: Diagnosis not present

## 2018-01-23 DIAGNOSIS — N2581 Secondary hyperparathyroidism of renal origin: Secondary | ICD-10-CM | POA: Diagnosis not present

## 2018-01-23 DIAGNOSIS — N186 End stage renal disease: Secondary | ICD-10-CM | POA: Diagnosis not present

## 2018-01-23 DIAGNOSIS — D509 Iron deficiency anemia, unspecified: Secondary | ICD-10-CM | POA: Diagnosis not present

## 2018-01-23 DIAGNOSIS — D631 Anemia in chronic kidney disease: Secondary | ICD-10-CM | POA: Diagnosis not present

## 2018-01-26 DIAGNOSIS — D631 Anemia in chronic kidney disease: Secondary | ICD-10-CM | POA: Diagnosis not present

## 2018-01-26 DIAGNOSIS — N2581 Secondary hyperparathyroidism of renal origin: Secondary | ICD-10-CM | POA: Diagnosis not present

## 2018-01-26 DIAGNOSIS — D509 Iron deficiency anemia, unspecified: Secondary | ICD-10-CM | POA: Diagnosis not present

## 2018-01-26 DIAGNOSIS — N186 End stage renal disease: Secondary | ICD-10-CM | POA: Diagnosis not present

## 2018-01-26 DIAGNOSIS — E1129 Type 2 diabetes mellitus with other diabetic kidney complication: Secondary | ICD-10-CM | POA: Diagnosis not present

## 2018-01-28 DIAGNOSIS — D509 Iron deficiency anemia, unspecified: Secondary | ICD-10-CM | POA: Diagnosis not present

## 2018-01-28 DIAGNOSIS — D631 Anemia in chronic kidney disease: Secondary | ICD-10-CM | POA: Diagnosis not present

## 2018-01-28 DIAGNOSIS — N2581 Secondary hyperparathyroidism of renal origin: Secondary | ICD-10-CM | POA: Diagnosis not present

## 2018-01-28 DIAGNOSIS — N186 End stage renal disease: Secondary | ICD-10-CM | POA: Diagnosis not present

## 2018-01-28 DIAGNOSIS — E1129 Type 2 diabetes mellitus with other diabetic kidney complication: Secondary | ICD-10-CM | POA: Diagnosis not present

## 2018-01-30 DIAGNOSIS — D509 Iron deficiency anemia, unspecified: Secondary | ICD-10-CM | POA: Diagnosis not present

## 2018-01-30 DIAGNOSIS — N186 End stage renal disease: Secondary | ICD-10-CM | POA: Diagnosis not present

## 2018-01-30 DIAGNOSIS — E1129 Type 2 diabetes mellitus with other diabetic kidney complication: Secondary | ICD-10-CM | POA: Diagnosis not present

## 2018-01-30 DIAGNOSIS — N2581 Secondary hyperparathyroidism of renal origin: Secondary | ICD-10-CM | POA: Diagnosis not present

## 2018-01-30 DIAGNOSIS — D631 Anemia in chronic kidney disease: Secondary | ICD-10-CM | POA: Diagnosis not present

## 2018-01-31 DIAGNOSIS — Z992 Dependence on renal dialysis: Secondary | ICD-10-CM | POA: Diagnosis not present

## 2018-01-31 DIAGNOSIS — I129 Hypertensive chronic kidney disease with stage 1 through stage 4 chronic kidney disease, or unspecified chronic kidney disease: Secondary | ICD-10-CM | POA: Diagnosis not present

## 2018-01-31 DIAGNOSIS — N186 End stage renal disease: Secondary | ICD-10-CM | POA: Diagnosis not present

## 2018-02-02 DIAGNOSIS — N186 End stage renal disease: Secondary | ICD-10-CM | POA: Diagnosis not present

## 2018-02-02 DIAGNOSIS — D631 Anemia in chronic kidney disease: Secondary | ICD-10-CM | POA: Diagnosis not present

## 2018-02-02 DIAGNOSIS — D509 Iron deficiency anemia, unspecified: Secondary | ICD-10-CM | POA: Diagnosis not present

## 2018-02-02 DIAGNOSIS — E1129 Type 2 diabetes mellitus with other diabetic kidney complication: Secondary | ICD-10-CM | POA: Diagnosis not present

## 2018-02-02 DIAGNOSIS — N2581 Secondary hyperparathyroidism of renal origin: Secondary | ICD-10-CM | POA: Diagnosis not present

## 2018-02-04 DIAGNOSIS — D509 Iron deficiency anemia, unspecified: Secondary | ICD-10-CM | POA: Diagnosis not present

## 2018-02-04 DIAGNOSIS — N2581 Secondary hyperparathyroidism of renal origin: Secondary | ICD-10-CM | POA: Diagnosis not present

## 2018-02-04 DIAGNOSIS — N186 End stage renal disease: Secondary | ICD-10-CM | POA: Diagnosis not present

## 2018-02-04 DIAGNOSIS — E1129 Type 2 diabetes mellitus with other diabetic kidney complication: Secondary | ICD-10-CM | POA: Diagnosis not present

## 2018-02-04 DIAGNOSIS — D631 Anemia in chronic kidney disease: Secondary | ICD-10-CM | POA: Diagnosis not present

## 2018-02-06 DIAGNOSIS — D631 Anemia in chronic kidney disease: Secondary | ICD-10-CM | POA: Diagnosis not present

## 2018-02-06 DIAGNOSIS — E1129 Type 2 diabetes mellitus with other diabetic kidney complication: Secondary | ICD-10-CM | POA: Diagnosis not present

## 2018-02-06 DIAGNOSIS — N186 End stage renal disease: Secondary | ICD-10-CM | POA: Diagnosis not present

## 2018-02-06 DIAGNOSIS — N2581 Secondary hyperparathyroidism of renal origin: Secondary | ICD-10-CM | POA: Diagnosis not present

## 2018-02-06 DIAGNOSIS — D509 Iron deficiency anemia, unspecified: Secondary | ICD-10-CM | POA: Diagnosis not present

## 2018-02-09 DIAGNOSIS — N2581 Secondary hyperparathyroidism of renal origin: Secondary | ICD-10-CM | POA: Diagnosis not present

## 2018-02-09 DIAGNOSIS — D631 Anemia in chronic kidney disease: Secondary | ICD-10-CM | POA: Diagnosis not present

## 2018-02-09 DIAGNOSIS — E1129 Type 2 diabetes mellitus with other diabetic kidney complication: Secondary | ICD-10-CM | POA: Diagnosis not present

## 2018-02-09 DIAGNOSIS — D509 Iron deficiency anemia, unspecified: Secondary | ICD-10-CM | POA: Diagnosis not present

## 2018-02-09 DIAGNOSIS — N186 End stage renal disease: Secondary | ICD-10-CM | POA: Diagnosis not present

## 2018-02-11 DIAGNOSIS — D631 Anemia in chronic kidney disease: Secondary | ICD-10-CM | POA: Diagnosis not present

## 2018-02-11 DIAGNOSIS — N2581 Secondary hyperparathyroidism of renal origin: Secondary | ICD-10-CM | POA: Diagnosis not present

## 2018-02-11 DIAGNOSIS — N186 End stage renal disease: Secondary | ICD-10-CM | POA: Diagnosis not present

## 2018-02-11 DIAGNOSIS — D509 Iron deficiency anemia, unspecified: Secondary | ICD-10-CM | POA: Diagnosis not present

## 2018-02-11 DIAGNOSIS — E1129 Type 2 diabetes mellitus with other diabetic kidney complication: Secondary | ICD-10-CM | POA: Diagnosis not present

## 2018-02-13 DIAGNOSIS — D509 Iron deficiency anemia, unspecified: Secondary | ICD-10-CM | POA: Diagnosis not present

## 2018-02-13 DIAGNOSIS — D631 Anemia in chronic kidney disease: Secondary | ICD-10-CM | POA: Diagnosis not present

## 2018-02-13 DIAGNOSIS — N2581 Secondary hyperparathyroidism of renal origin: Secondary | ICD-10-CM | POA: Diagnosis not present

## 2018-02-13 DIAGNOSIS — N186 End stage renal disease: Secondary | ICD-10-CM | POA: Diagnosis not present

## 2018-02-13 DIAGNOSIS — E1129 Type 2 diabetes mellitus with other diabetic kidney complication: Secondary | ICD-10-CM | POA: Diagnosis not present

## 2018-02-16 DIAGNOSIS — D631 Anemia in chronic kidney disease: Secondary | ICD-10-CM | POA: Diagnosis not present

## 2018-02-16 DIAGNOSIS — D509 Iron deficiency anemia, unspecified: Secondary | ICD-10-CM | POA: Diagnosis not present

## 2018-02-16 DIAGNOSIS — E1129 Type 2 diabetes mellitus with other diabetic kidney complication: Secondary | ICD-10-CM | POA: Diagnosis not present

## 2018-02-16 DIAGNOSIS — N186 End stage renal disease: Secondary | ICD-10-CM | POA: Diagnosis not present

## 2018-02-16 DIAGNOSIS — N2581 Secondary hyperparathyroidism of renal origin: Secondary | ICD-10-CM | POA: Diagnosis not present

## 2018-02-16 MED FILL — $Viagra 50mg tablet: 50 | 30 days supply | Qty: 10 | Fill #4

## 2018-02-18 DIAGNOSIS — N186 End stage renal disease: Secondary | ICD-10-CM | POA: Diagnosis not present

## 2018-02-18 DIAGNOSIS — N2581 Secondary hyperparathyroidism of renal origin: Secondary | ICD-10-CM | POA: Diagnosis not present

## 2018-02-18 DIAGNOSIS — E1129 Type 2 diabetes mellitus with other diabetic kidney complication: Secondary | ICD-10-CM | POA: Diagnosis not present

## 2018-02-18 DIAGNOSIS — D631 Anemia in chronic kidney disease: Secondary | ICD-10-CM | POA: Diagnosis not present

## 2018-02-18 DIAGNOSIS — D509 Iron deficiency anemia, unspecified: Secondary | ICD-10-CM | POA: Diagnosis not present

## 2018-02-20 DIAGNOSIS — D509 Iron deficiency anemia, unspecified: Secondary | ICD-10-CM | POA: Diagnosis not present

## 2018-02-20 DIAGNOSIS — N186 End stage renal disease: Secondary | ICD-10-CM | POA: Diagnosis not present

## 2018-02-20 DIAGNOSIS — N2581 Secondary hyperparathyroidism of renal origin: Secondary | ICD-10-CM | POA: Diagnosis not present

## 2018-02-20 DIAGNOSIS — E1129 Type 2 diabetes mellitus with other diabetic kidney complication: Secondary | ICD-10-CM | POA: Diagnosis not present

## 2018-02-20 DIAGNOSIS — D631 Anemia in chronic kidney disease: Secondary | ICD-10-CM | POA: Diagnosis not present

## 2018-02-23 DIAGNOSIS — N2581 Secondary hyperparathyroidism of renal origin: Secondary | ICD-10-CM | POA: Diagnosis not present

## 2018-02-23 DIAGNOSIS — D509 Iron deficiency anemia, unspecified: Secondary | ICD-10-CM | POA: Diagnosis not present

## 2018-02-23 DIAGNOSIS — E1129 Type 2 diabetes mellitus with other diabetic kidney complication: Secondary | ICD-10-CM | POA: Diagnosis not present

## 2018-02-23 DIAGNOSIS — N186 End stage renal disease: Secondary | ICD-10-CM | POA: Diagnosis not present

## 2018-02-23 DIAGNOSIS — D631 Anemia in chronic kidney disease: Secondary | ICD-10-CM | POA: Diagnosis not present

## 2018-02-25 DIAGNOSIS — N186 End stage renal disease: Secondary | ICD-10-CM | POA: Diagnosis not present

## 2018-02-25 DIAGNOSIS — N2581 Secondary hyperparathyroidism of renal origin: Secondary | ICD-10-CM | POA: Diagnosis not present

## 2018-02-25 DIAGNOSIS — E1129 Type 2 diabetes mellitus with other diabetic kidney complication: Secondary | ICD-10-CM | POA: Diagnosis not present

## 2018-02-25 DIAGNOSIS — D509 Iron deficiency anemia, unspecified: Secondary | ICD-10-CM | POA: Diagnosis not present

## 2018-02-25 DIAGNOSIS — D631 Anemia in chronic kidney disease: Secondary | ICD-10-CM | POA: Diagnosis not present

## 2018-02-27 DIAGNOSIS — N186 End stage renal disease: Secondary | ICD-10-CM | POA: Diagnosis not present

## 2018-02-27 DIAGNOSIS — D509 Iron deficiency anemia, unspecified: Secondary | ICD-10-CM | POA: Diagnosis not present

## 2018-02-27 DIAGNOSIS — N2581 Secondary hyperparathyroidism of renal origin: Secondary | ICD-10-CM | POA: Diagnosis not present

## 2018-02-27 DIAGNOSIS — D631 Anemia in chronic kidney disease: Secondary | ICD-10-CM | POA: Diagnosis not present

## 2018-02-27 DIAGNOSIS — E1129 Type 2 diabetes mellitus with other diabetic kidney complication: Secondary | ICD-10-CM | POA: Diagnosis not present

## 2018-03-02 DIAGNOSIS — D509 Iron deficiency anemia, unspecified: Secondary | ICD-10-CM | POA: Diagnosis not present

## 2018-03-02 DIAGNOSIS — I129 Hypertensive chronic kidney disease with stage 1 through stage 4 chronic kidney disease, or unspecified chronic kidney disease: Secondary | ICD-10-CM | POA: Diagnosis not present

## 2018-03-02 DIAGNOSIS — N186 End stage renal disease: Secondary | ICD-10-CM | POA: Diagnosis not present

## 2018-03-02 DIAGNOSIS — Z992 Dependence on renal dialysis: Secondary | ICD-10-CM | POA: Diagnosis not present

## 2018-03-02 DIAGNOSIS — N2581 Secondary hyperparathyroidism of renal origin: Secondary | ICD-10-CM | POA: Diagnosis not present

## 2018-03-02 DIAGNOSIS — D631 Anemia in chronic kidney disease: Secondary | ICD-10-CM | POA: Diagnosis not present

## 2018-03-02 DIAGNOSIS — E1129 Type 2 diabetes mellitus with other diabetic kidney complication: Secondary | ICD-10-CM | POA: Diagnosis not present

## 2018-03-04 DIAGNOSIS — D631 Anemia in chronic kidney disease: Secondary | ICD-10-CM | POA: Diagnosis not present

## 2018-03-04 DIAGNOSIS — N186 End stage renal disease: Secondary | ICD-10-CM | POA: Diagnosis not present

## 2018-03-04 DIAGNOSIS — N2581 Secondary hyperparathyroidism of renal origin: Secondary | ICD-10-CM | POA: Diagnosis not present

## 2018-03-04 DIAGNOSIS — E1129 Type 2 diabetes mellitus with other diabetic kidney complication: Secondary | ICD-10-CM | POA: Diagnosis not present

## 2018-03-04 DIAGNOSIS — D509 Iron deficiency anemia, unspecified: Secondary | ICD-10-CM | POA: Diagnosis not present

## 2018-03-06 DIAGNOSIS — D631 Anemia in chronic kidney disease: Secondary | ICD-10-CM | POA: Diagnosis not present

## 2018-03-06 DIAGNOSIS — E1129 Type 2 diabetes mellitus with other diabetic kidney complication: Secondary | ICD-10-CM | POA: Diagnosis not present

## 2018-03-06 DIAGNOSIS — D509 Iron deficiency anemia, unspecified: Secondary | ICD-10-CM | POA: Diagnosis not present

## 2018-03-06 DIAGNOSIS — N2581 Secondary hyperparathyroidism of renal origin: Secondary | ICD-10-CM | POA: Diagnosis not present

## 2018-03-06 DIAGNOSIS — N186 End stage renal disease: Secondary | ICD-10-CM | POA: Diagnosis not present

## 2018-03-09 DIAGNOSIS — D631 Anemia in chronic kidney disease: Secondary | ICD-10-CM | POA: Diagnosis not present

## 2018-03-09 DIAGNOSIS — D509 Iron deficiency anemia, unspecified: Secondary | ICD-10-CM | POA: Diagnosis not present

## 2018-03-09 DIAGNOSIS — N186 End stage renal disease: Secondary | ICD-10-CM | POA: Diagnosis not present

## 2018-03-09 DIAGNOSIS — N2581 Secondary hyperparathyroidism of renal origin: Secondary | ICD-10-CM | POA: Diagnosis not present

## 2018-03-09 DIAGNOSIS — E1129 Type 2 diabetes mellitus with other diabetic kidney complication: Secondary | ICD-10-CM | POA: Diagnosis not present

## 2018-03-11 DIAGNOSIS — E1129 Type 2 diabetes mellitus with other diabetic kidney complication: Secondary | ICD-10-CM | POA: Diagnosis not present

## 2018-03-11 DIAGNOSIS — D631 Anemia in chronic kidney disease: Secondary | ICD-10-CM | POA: Diagnosis not present

## 2018-03-11 DIAGNOSIS — N2581 Secondary hyperparathyroidism of renal origin: Secondary | ICD-10-CM | POA: Diagnosis not present

## 2018-03-11 DIAGNOSIS — N186 End stage renal disease: Secondary | ICD-10-CM | POA: Diagnosis not present

## 2018-03-11 DIAGNOSIS — D509 Iron deficiency anemia, unspecified: Secondary | ICD-10-CM | POA: Diagnosis not present

## 2018-03-13 DIAGNOSIS — E1129 Type 2 diabetes mellitus with other diabetic kidney complication: Secondary | ICD-10-CM | POA: Diagnosis not present

## 2018-03-13 DIAGNOSIS — D509 Iron deficiency anemia, unspecified: Secondary | ICD-10-CM | POA: Diagnosis not present

## 2018-03-13 DIAGNOSIS — N2581 Secondary hyperparathyroidism of renal origin: Secondary | ICD-10-CM | POA: Diagnosis not present

## 2018-03-13 DIAGNOSIS — D631 Anemia in chronic kidney disease: Secondary | ICD-10-CM | POA: Diagnosis not present

## 2018-03-13 DIAGNOSIS — N186 End stage renal disease: Secondary | ICD-10-CM | POA: Diagnosis not present

## 2018-03-16 DIAGNOSIS — N2581 Secondary hyperparathyroidism of renal origin: Secondary | ICD-10-CM | POA: Diagnosis not present

## 2018-03-16 DIAGNOSIS — D631 Anemia in chronic kidney disease: Secondary | ICD-10-CM | POA: Diagnosis not present

## 2018-03-16 DIAGNOSIS — N186 End stage renal disease: Secondary | ICD-10-CM | POA: Diagnosis not present

## 2018-03-16 DIAGNOSIS — D509 Iron deficiency anemia, unspecified: Secondary | ICD-10-CM | POA: Diagnosis not present

## 2018-03-16 DIAGNOSIS — E1129 Type 2 diabetes mellitus with other diabetic kidney complication: Secondary | ICD-10-CM | POA: Diagnosis not present

## 2018-03-17 DIAGNOSIS — D631 Anemia in chronic kidney disease: Secondary | ICD-10-CM | POA: Diagnosis not present

## 2018-03-17 DIAGNOSIS — E1129 Type 2 diabetes mellitus with other diabetic kidney complication: Secondary | ICD-10-CM | POA: Diagnosis not present

## 2018-03-17 DIAGNOSIS — D509 Iron deficiency anemia, unspecified: Secondary | ICD-10-CM | POA: Diagnosis not present

## 2018-03-17 DIAGNOSIS — N186 End stage renal disease: Secondary | ICD-10-CM | POA: Diagnosis not present

## 2018-03-17 DIAGNOSIS — N2581 Secondary hyperparathyroidism of renal origin: Secondary | ICD-10-CM | POA: Diagnosis not present

## 2018-03-17 MED FILL — CARVEDILOL 3.125 MG TABLET: 3.125 | 30 days supply | Qty: 60 | Fill #4

## 2018-03-17 MED FILL — $Viagra 50mg tablet: 50 | 30 days supply | Qty: 10 | Fill #5

## 2018-03-17 MED FILL — AMLODIPINE BESYLATE 10 MG T: 10 | 30 days supply | Qty: 15 | Fill #1

## 2018-03-23 DIAGNOSIS — N186 End stage renal disease: Secondary | ICD-10-CM | POA: Diagnosis not present

## 2018-03-23 DIAGNOSIS — E1129 Type 2 diabetes mellitus with other diabetic kidney complication: Secondary | ICD-10-CM | POA: Diagnosis not present

## 2018-03-23 DIAGNOSIS — N2581 Secondary hyperparathyroidism of renal origin: Secondary | ICD-10-CM | POA: Diagnosis not present

## 2018-03-23 DIAGNOSIS — D631 Anemia in chronic kidney disease: Secondary | ICD-10-CM | POA: Diagnosis not present

## 2018-03-23 DIAGNOSIS — D509 Iron deficiency anemia, unspecified: Secondary | ICD-10-CM | POA: Diagnosis not present

## 2018-03-24 MED FILL — SEVELAMER CARBONATE 800 MG: 800 | 30 days supply | Qty: 390 | Fill #4

## 2018-03-25 DIAGNOSIS — E1129 Type 2 diabetes mellitus with other diabetic kidney complication: Secondary | ICD-10-CM | POA: Diagnosis not present

## 2018-03-25 DIAGNOSIS — D509 Iron deficiency anemia, unspecified: Secondary | ICD-10-CM | POA: Diagnosis not present

## 2018-03-25 DIAGNOSIS — D631 Anemia in chronic kidney disease: Secondary | ICD-10-CM | POA: Diagnosis not present

## 2018-03-25 DIAGNOSIS — N2581 Secondary hyperparathyroidism of renal origin: Secondary | ICD-10-CM | POA: Diagnosis not present

## 2018-03-25 DIAGNOSIS — N186 End stage renal disease: Secondary | ICD-10-CM | POA: Diagnosis not present

## 2018-03-27 DIAGNOSIS — N2581 Secondary hyperparathyroidism of renal origin: Secondary | ICD-10-CM | POA: Diagnosis not present

## 2018-03-27 DIAGNOSIS — N186 End stage renal disease: Secondary | ICD-10-CM | POA: Diagnosis not present

## 2018-03-27 DIAGNOSIS — E1129 Type 2 diabetes mellitus with other diabetic kidney complication: Secondary | ICD-10-CM | POA: Diagnosis not present

## 2018-03-27 DIAGNOSIS — D509 Iron deficiency anemia, unspecified: Secondary | ICD-10-CM | POA: Diagnosis not present

## 2018-03-27 DIAGNOSIS — D631 Anemia in chronic kidney disease: Secondary | ICD-10-CM | POA: Diagnosis not present

## 2018-03-30 DIAGNOSIS — N186 End stage renal disease: Secondary | ICD-10-CM | POA: Diagnosis not present

## 2018-03-30 DIAGNOSIS — D631 Anemia in chronic kidney disease: Secondary | ICD-10-CM | POA: Diagnosis not present

## 2018-03-30 DIAGNOSIS — E1129 Type 2 diabetes mellitus with other diabetic kidney complication: Secondary | ICD-10-CM | POA: Diagnosis not present

## 2018-03-30 DIAGNOSIS — N2581 Secondary hyperparathyroidism of renal origin: Secondary | ICD-10-CM | POA: Diagnosis not present

## 2018-03-30 DIAGNOSIS — D509 Iron deficiency anemia, unspecified: Secondary | ICD-10-CM | POA: Diagnosis not present

## 2018-04-01 DIAGNOSIS — N2581 Secondary hyperparathyroidism of renal origin: Secondary | ICD-10-CM | POA: Diagnosis not present

## 2018-04-01 DIAGNOSIS — D631 Anemia in chronic kidney disease: Secondary | ICD-10-CM | POA: Diagnosis not present

## 2018-04-01 DIAGNOSIS — D509 Iron deficiency anemia, unspecified: Secondary | ICD-10-CM | POA: Diagnosis not present

## 2018-04-01 DIAGNOSIS — E1129 Type 2 diabetes mellitus with other diabetic kidney complication: Secondary | ICD-10-CM | POA: Diagnosis not present

## 2018-04-01 DIAGNOSIS — N186 End stage renal disease: Secondary | ICD-10-CM | POA: Diagnosis not present

## 2018-04-02 DIAGNOSIS — Z992 Dependence on renal dialysis: Secondary | ICD-10-CM | POA: Diagnosis not present

## 2018-04-02 DIAGNOSIS — I129 Hypertensive chronic kidney disease with stage 1 through stage 4 chronic kidney disease, or unspecified chronic kidney disease: Secondary | ICD-10-CM | POA: Diagnosis not present

## 2018-04-02 DIAGNOSIS — N186 End stage renal disease: Secondary | ICD-10-CM | POA: Diagnosis not present

## 2018-04-06 DIAGNOSIS — N2581 Secondary hyperparathyroidism of renal origin: Secondary | ICD-10-CM | POA: Diagnosis not present

## 2018-04-06 DIAGNOSIS — E1129 Type 2 diabetes mellitus with other diabetic kidney complication: Secondary | ICD-10-CM | POA: Diagnosis not present

## 2018-04-06 DIAGNOSIS — D509 Iron deficiency anemia, unspecified: Secondary | ICD-10-CM | POA: Diagnosis not present

## 2018-04-06 DIAGNOSIS — D631 Anemia in chronic kidney disease: Secondary | ICD-10-CM | POA: Diagnosis not present

## 2018-04-06 DIAGNOSIS — N186 End stage renal disease: Secondary | ICD-10-CM | POA: Diagnosis not present

## 2018-04-07 ENCOUNTER — Ambulatory Visit: Payer: Medicare Other | Admitting: Internal Medicine

## 2018-04-08 DIAGNOSIS — E1129 Type 2 diabetes mellitus with other diabetic kidney complication: Secondary | ICD-10-CM | POA: Diagnosis not present

## 2018-04-08 DIAGNOSIS — D631 Anemia in chronic kidney disease: Secondary | ICD-10-CM | POA: Diagnosis not present

## 2018-04-08 DIAGNOSIS — N186 End stage renal disease: Secondary | ICD-10-CM | POA: Diagnosis not present

## 2018-04-08 DIAGNOSIS — D509 Iron deficiency anemia, unspecified: Secondary | ICD-10-CM | POA: Diagnosis not present

## 2018-04-08 DIAGNOSIS — N2581 Secondary hyperparathyroidism of renal origin: Secondary | ICD-10-CM | POA: Diagnosis not present

## 2018-04-10 DIAGNOSIS — E1129 Type 2 diabetes mellitus with other diabetic kidney complication: Secondary | ICD-10-CM | POA: Diagnosis not present

## 2018-04-10 DIAGNOSIS — D509 Iron deficiency anemia, unspecified: Secondary | ICD-10-CM | POA: Diagnosis not present

## 2018-04-10 DIAGNOSIS — N186 End stage renal disease: Secondary | ICD-10-CM | POA: Diagnosis not present

## 2018-04-10 DIAGNOSIS — N2581 Secondary hyperparathyroidism of renal origin: Secondary | ICD-10-CM | POA: Diagnosis not present

## 2018-04-10 DIAGNOSIS — D631 Anemia in chronic kidney disease: Secondary | ICD-10-CM | POA: Diagnosis not present

## 2018-04-13 DIAGNOSIS — N186 End stage renal disease: Secondary | ICD-10-CM | POA: Diagnosis not present

## 2018-04-13 DIAGNOSIS — D631 Anemia in chronic kidney disease: Secondary | ICD-10-CM | POA: Diagnosis not present

## 2018-04-13 DIAGNOSIS — N2581 Secondary hyperparathyroidism of renal origin: Secondary | ICD-10-CM | POA: Diagnosis not present

## 2018-04-13 DIAGNOSIS — D509 Iron deficiency anemia, unspecified: Secondary | ICD-10-CM | POA: Diagnosis not present

## 2018-04-13 DIAGNOSIS — E1129 Type 2 diabetes mellitus with other diabetic kidney complication: Secondary | ICD-10-CM | POA: Diagnosis not present

## 2018-04-15 DIAGNOSIS — D509 Iron deficiency anemia, unspecified: Secondary | ICD-10-CM | POA: Diagnosis not present

## 2018-04-15 DIAGNOSIS — D631 Anemia in chronic kidney disease: Secondary | ICD-10-CM | POA: Diagnosis not present

## 2018-04-15 DIAGNOSIS — E1129 Type 2 diabetes mellitus with other diabetic kidney complication: Secondary | ICD-10-CM | POA: Diagnosis not present

## 2018-04-15 DIAGNOSIS — N186 End stage renal disease: Secondary | ICD-10-CM | POA: Diagnosis not present

## 2018-04-15 DIAGNOSIS — N2581 Secondary hyperparathyroidism of renal origin: Secondary | ICD-10-CM | POA: Diagnosis not present

## 2018-04-17 DIAGNOSIS — N186 End stage renal disease: Secondary | ICD-10-CM | POA: Diagnosis not present

## 2018-04-17 DIAGNOSIS — N2581 Secondary hyperparathyroidism of renal origin: Secondary | ICD-10-CM | POA: Diagnosis not present

## 2018-04-17 DIAGNOSIS — D631 Anemia in chronic kidney disease: Secondary | ICD-10-CM | POA: Diagnosis not present

## 2018-04-17 DIAGNOSIS — D509 Iron deficiency anemia, unspecified: Secondary | ICD-10-CM | POA: Diagnosis not present

## 2018-04-17 DIAGNOSIS — E1129 Type 2 diabetes mellitus with other diabetic kidney complication: Secondary | ICD-10-CM | POA: Diagnosis not present

## 2018-04-20 DIAGNOSIS — N2581 Secondary hyperparathyroidism of renal origin: Secondary | ICD-10-CM | POA: Diagnosis not present

## 2018-04-20 DIAGNOSIS — D509 Iron deficiency anemia, unspecified: Secondary | ICD-10-CM | POA: Diagnosis not present

## 2018-04-20 DIAGNOSIS — N186 End stage renal disease: Secondary | ICD-10-CM | POA: Diagnosis not present

## 2018-04-20 DIAGNOSIS — D631 Anemia in chronic kidney disease: Secondary | ICD-10-CM | POA: Diagnosis not present

## 2018-04-20 DIAGNOSIS — E1129 Type 2 diabetes mellitus with other diabetic kidney complication: Secondary | ICD-10-CM | POA: Diagnosis not present

## 2018-04-22 DIAGNOSIS — D631 Anemia in chronic kidney disease: Secondary | ICD-10-CM | POA: Diagnosis not present

## 2018-04-22 DIAGNOSIS — E1129 Type 2 diabetes mellitus with other diabetic kidney complication: Secondary | ICD-10-CM | POA: Diagnosis not present

## 2018-04-22 DIAGNOSIS — D509 Iron deficiency anemia, unspecified: Secondary | ICD-10-CM | POA: Diagnosis not present

## 2018-04-22 DIAGNOSIS — N2581 Secondary hyperparathyroidism of renal origin: Secondary | ICD-10-CM | POA: Diagnosis not present

## 2018-04-22 DIAGNOSIS — N186 End stage renal disease: Secondary | ICD-10-CM | POA: Diagnosis not present

## 2018-04-24 DIAGNOSIS — N2581 Secondary hyperparathyroidism of renal origin: Secondary | ICD-10-CM | POA: Diagnosis not present

## 2018-04-24 DIAGNOSIS — D631 Anemia in chronic kidney disease: Secondary | ICD-10-CM | POA: Diagnosis not present

## 2018-04-24 DIAGNOSIS — D509 Iron deficiency anemia, unspecified: Secondary | ICD-10-CM | POA: Diagnosis not present

## 2018-04-24 DIAGNOSIS — E1129 Type 2 diabetes mellitus with other diabetic kidney complication: Secondary | ICD-10-CM | POA: Diagnosis not present

## 2018-04-24 DIAGNOSIS — N186 End stage renal disease: Secondary | ICD-10-CM | POA: Diagnosis not present

## 2018-04-27 DIAGNOSIS — N186 End stage renal disease: Secondary | ICD-10-CM | POA: Diagnosis not present

## 2018-04-27 DIAGNOSIS — N2581 Secondary hyperparathyroidism of renal origin: Secondary | ICD-10-CM | POA: Diagnosis not present

## 2018-04-27 DIAGNOSIS — E1129 Type 2 diabetes mellitus with other diabetic kidney complication: Secondary | ICD-10-CM | POA: Diagnosis not present

## 2018-04-27 DIAGNOSIS — D509 Iron deficiency anemia, unspecified: Secondary | ICD-10-CM | POA: Diagnosis not present

## 2018-04-27 DIAGNOSIS — D631 Anemia in chronic kidney disease: Secondary | ICD-10-CM | POA: Diagnosis not present

## 2018-04-29 DIAGNOSIS — N186 End stage renal disease: Secondary | ICD-10-CM | POA: Diagnosis not present

## 2018-04-29 DIAGNOSIS — E1129 Type 2 diabetes mellitus with other diabetic kidney complication: Secondary | ICD-10-CM | POA: Diagnosis not present

## 2018-04-29 DIAGNOSIS — D631 Anemia in chronic kidney disease: Secondary | ICD-10-CM | POA: Diagnosis not present

## 2018-04-29 DIAGNOSIS — D509 Iron deficiency anemia, unspecified: Secondary | ICD-10-CM | POA: Diagnosis not present

## 2018-04-29 DIAGNOSIS — N2581 Secondary hyperparathyroidism of renal origin: Secondary | ICD-10-CM | POA: Diagnosis not present

## 2018-05-01 DIAGNOSIS — E1129 Type 2 diabetes mellitus with other diabetic kidney complication: Secondary | ICD-10-CM | POA: Diagnosis not present

## 2018-05-01 DIAGNOSIS — D509 Iron deficiency anemia, unspecified: Secondary | ICD-10-CM | POA: Diagnosis not present

## 2018-05-01 DIAGNOSIS — N2581 Secondary hyperparathyroidism of renal origin: Secondary | ICD-10-CM | POA: Diagnosis not present

## 2018-05-01 DIAGNOSIS — D631 Anemia in chronic kidney disease: Secondary | ICD-10-CM | POA: Diagnosis not present

## 2018-05-01 DIAGNOSIS — N186 End stage renal disease: Secondary | ICD-10-CM | POA: Diagnosis not present

## 2018-05-03 DIAGNOSIS — I129 Hypertensive chronic kidney disease with stage 1 through stage 4 chronic kidney disease, or unspecified chronic kidney disease: Secondary | ICD-10-CM | POA: Diagnosis not present

## 2018-05-03 DIAGNOSIS — N186 End stage renal disease: Secondary | ICD-10-CM | POA: Diagnosis not present

## 2018-05-03 DIAGNOSIS — Z992 Dependence on renal dialysis: Secondary | ICD-10-CM | POA: Diagnosis not present

## 2018-05-06 DIAGNOSIS — D509 Iron deficiency anemia, unspecified: Secondary | ICD-10-CM | POA: Diagnosis not present

## 2018-05-06 DIAGNOSIS — Z23 Encounter for immunization: Secondary | ICD-10-CM | POA: Diagnosis not present

## 2018-05-06 DIAGNOSIS — N2581 Secondary hyperparathyroidism of renal origin: Secondary | ICD-10-CM | POA: Diagnosis not present

## 2018-05-06 DIAGNOSIS — N186 End stage renal disease: Secondary | ICD-10-CM | POA: Diagnosis not present

## 2018-05-06 DIAGNOSIS — E1129 Type 2 diabetes mellitus with other diabetic kidney complication: Secondary | ICD-10-CM | POA: Diagnosis not present

## 2018-05-06 MED FILL — CARVEDILOL 3.125 MG TABLET: 3.125 | 30 days supply | Qty: 60 | Fill #5

## 2018-05-06 MED FILL — $Viagra 50mg tablet: 50 | 30 days supply | Qty: 10 | Fill #6

## 2018-05-06 MED FILL — SEVELAMER CARBONATE 800 MG: 800 | 30 days supply | Qty: 390 | Fill #5

## 2018-05-06 MED FILL — AMLODIPINE BESYLATE 10 MG T: 10 | 30 days supply | Qty: 15 | Fill #2

## 2018-05-08 DIAGNOSIS — E1129 Type 2 diabetes mellitus with other diabetic kidney complication: Secondary | ICD-10-CM | POA: Diagnosis not present

## 2018-05-08 DIAGNOSIS — D509 Iron deficiency anemia, unspecified: Secondary | ICD-10-CM | POA: Diagnosis not present

## 2018-05-08 DIAGNOSIS — Z23 Encounter for immunization: Secondary | ICD-10-CM | POA: Diagnosis not present

## 2018-05-08 DIAGNOSIS — N2581 Secondary hyperparathyroidism of renal origin: Secondary | ICD-10-CM | POA: Diagnosis not present

## 2018-05-08 DIAGNOSIS — N186 End stage renal disease: Secondary | ICD-10-CM | POA: Diagnosis not present

## 2018-05-11 DIAGNOSIS — D509 Iron deficiency anemia, unspecified: Secondary | ICD-10-CM | POA: Diagnosis not present

## 2018-05-11 DIAGNOSIS — N2581 Secondary hyperparathyroidism of renal origin: Secondary | ICD-10-CM | POA: Diagnosis not present

## 2018-05-11 DIAGNOSIS — Z23 Encounter for immunization: Secondary | ICD-10-CM | POA: Diagnosis not present

## 2018-05-11 DIAGNOSIS — N186 End stage renal disease: Secondary | ICD-10-CM | POA: Diagnosis not present

## 2018-05-11 DIAGNOSIS — E1129 Type 2 diabetes mellitus with other diabetic kidney complication: Secondary | ICD-10-CM | POA: Diagnosis not present

## 2018-05-13 DIAGNOSIS — D509 Iron deficiency anemia, unspecified: Secondary | ICD-10-CM | POA: Diagnosis not present

## 2018-05-13 DIAGNOSIS — Z23 Encounter for immunization: Secondary | ICD-10-CM | POA: Diagnosis not present

## 2018-05-13 DIAGNOSIS — E1129 Type 2 diabetes mellitus with other diabetic kidney complication: Secondary | ICD-10-CM | POA: Diagnosis not present

## 2018-05-13 DIAGNOSIS — N2581 Secondary hyperparathyroidism of renal origin: Secondary | ICD-10-CM | POA: Diagnosis not present

## 2018-05-13 DIAGNOSIS — N186 End stage renal disease: Secondary | ICD-10-CM | POA: Diagnosis not present

## 2018-05-15 DIAGNOSIS — N186 End stage renal disease: Secondary | ICD-10-CM | POA: Diagnosis not present

## 2018-05-15 DIAGNOSIS — N2581 Secondary hyperparathyroidism of renal origin: Secondary | ICD-10-CM | POA: Diagnosis not present

## 2018-05-15 DIAGNOSIS — E1129 Type 2 diabetes mellitus with other diabetic kidney complication: Secondary | ICD-10-CM | POA: Diagnosis not present

## 2018-05-15 DIAGNOSIS — Z23 Encounter for immunization: Secondary | ICD-10-CM | POA: Diagnosis not present

## 2018-05-15 DIAGNOSIS — D509 Iron deficiency anemia, unspecified: Secondary | ICD-10-CM | POA: Diagnosis not present

## 2018-05-18 DIAGNOSIS — D509 Iron deficiency anemia, unspecified: Secondary | ICD-10-CM | POA: Diagnosis not present

## 2018-05-18 DIAGNOSIS — E1129 Type 2 diabetes mellitus with other diabetic kidney complication: Secondary | ICD-10-CM | POA: Diagnosis not present

## 2018-05-18 DIAGNOSIS — N186 End stage renal disease: Secondary | ICD-10-CM | POA: Diagnosis not present

## 2018-05-18 DIAGNOSIS — Z23 Encounter for immunization: Secondary | ICD-10-CM | POA: Diagnosis not present

## 2018-05-18 DIAGNOSIS — N2581 Secondary hyperparathyroidism of renal origin: Secondary | ICD-10-CM | POA: Diagnosis not present

## 2018-05-20 DIAGNOSIS — N2581 Secondary hyperparathyroidism of renal origin: Secondary | ICD-10-CM | POA: Diagnosis not present

## 2018-05-20 DIAGNOSIS — E1129 Type 2 diabetes mellitus with other diabetic kidney complication: Secondary | ICD-10-CM | POA: Diagnosis not present

## 2018-05-20 DIAGNOSIS — N186 End stage renal disease: Secondary | ICD-10-CM | POA: Diagnosis not present

## 2018-05-20 DIAGNOSIS — Z23 Encounter for immunization: Secondary | ICD-10-CM | POA: Diagnosis not present

## 2018-05-20 DIAGNOSIS — D509 Iron deficiency anemia, unspecified: Secondary | ICD-10-CM | POA: Diagnosis not present

## 2018-05-22 DIAGNOSIS — D509 Iron deficiency anemia, unspecified: Secondary | ICD-10-CM | POA: Diagnosis not present

## 2018-05-22 DIAGNOSIS — Z23 Encounter for immunization: Secondary | ICD-10-CM | POA: Diagnosis not present

## 2018-05-22 DIAGNOSIS — E1129 Type 2 diabetes mellitus with other diabetic kidney complication: Secondary | ICD-10-CM | POA: Diagnosis not present

## 2018-05-22 DIAGNOSIS — N186 End stage renal disease: Secondary | ICD-10-CM | POA: Diagnosis not present

## 2018-05-22 DIAGNOSIS — N2581 Secondary hyperparathyroidism of renal origin: Secondary | ICD-10-CM | POA: Diagnosis not present

## 2018-05-25 DIAGNOSIS — Z23 Encounter for immunization: Secondary | ICD-10-CM | POA: Diagnosis not present

## 2018-05-25 DIAGNOSIS — E1129 Type 2 diabetes mellitus with other diabetic kidney complication: Secondary | ICD-10-CM | POA: Diagnosis not present

## 2018-05-25 DIAGNOSIS — D509 Iron deficiency anemia, unspecified: Secondary | ICD-10-CM | POA: Diagnosis not present

## 2018-05-25 DIAGNOSIS — N186 End stage renal disease: Secondary | ICD-10-CM | POA: Diagnosis not present

## 2018-05-25 DIAGNOSIS — N2581 Secondary hyperparathyroidism of renal origin: Secondary | ICD-10-CM | POA: Diagnosis not present

## 2018-05-27 DIAGNOSIS — N2581 Secondary hyperparathyroidism of renal origin: Secondary | ICD-10-CM | POA: Diagnosis not present

## 2018-05-27 DIAGNOSIS — D509 Iron deficiency anemia, unspecified: Secondary | ICD-10-CM | POA: Diagnosis not present

## 2018-05-27 DIAGNOSIS — E1129 Type 2 diabetes mellitus with other diabetic kidney complication: Secondary | ICD-10-CM | POA: Diagnosis not present

## 2018-05-27 DIAGNOSIS — Z23 Encounter for immunization: Secondary | ICD-10-CM | POA: Diagnosis not present

## 2018-05-27 DIAGNOSIS — N186 End stage renal disease: Secondary | ICD-10-CM | POA: Diagnosis not present

## 2018-05-29 DIAGNOSIS — N186 End stage renal disease: Secondary | ICD-10-CM | POA: Diagnosis not present

## 2018-05-29 DIAGNOSIS — N2581 Secondary hyperparathyroidism of renal origin: Secondary | ICD-10-CM | POA: Diagnosis not present

## 2018-05-29 DIAGNOSIS — D509 Iron deficiency anemia, unspecified: Secondary | ICD-10-CM | POA: Diagnosis not present

## 2018-05-29 DIAGNOSIS — E1129 Type 2 diabetes mellitus with other diabetic kidney complication: Secondary | ICD-10-CM | POA: Diagnosis not present

## 2018-05-29 DIAGNOSIS — Z23 Encounter for immunization: Secondary | ICD-10-CM | POA: Diagnosis not present

## 2018-06-01 DIAGNOSIS — E1129 Type 2 diabetes mellitus with other diabetic kidney complication: Secondary | ICD-10-CM | POA: Diagnosis not present

## 2018-06-01 DIAGNOSIS — N186 End stage renal disease: Secondary | ICD-10-CM | POA: Diagnosis not present

## 2018-06-01 DIAGNOSIS — D509 Iron deficiency anemia, unspecified: Secondary | ICD-10-CM | POA: Diagnosis not present

## 2018-06-01 DIAGNOSIS — Z23 Encounter for immunization: Secondary | ICD-10-CM | POA: Diagnosis not present

## 2018-06-01 DIAGNOSIS — N2581 Secondary hyperparathyroidism of renal origin: Secondary | ICD-10-CM | POA: Diagnosis not present

## 2018-06-02 DIAGNOSIS — N186 End stage renal disease: Secondary | ICD-10-CM | POA: Diagnosis not present

## 2018-06-02 DIAGNOSIS — I129 Hypertensive chronic kidney disease with stage 1 through stage 4 chronic kidney disease, or unspecified chronic kidney disease: Secondary | ICD-10-CM | POA: Diagnosis not present

## 2018-06-02 DIAGNOSIS — Z992 Dependence on renal dialysis: Secondary | ICD-10-CM | POA: Diagnosis not present

## 2018-06-03 DIAGNOSIS — N2581 Secondary hyperparathyroidism of renal origin: Secondary | ICD-10-CM | POA: Diagnosis not present

## 2018-06-03 DIAGNOSIS — N186 End stage renal disease: Secondary | ICD-10-CM | POA: Diagnosis not present

## 2018-06-03 DIAGNOSIS — D631 Anemia in chronic kidney disease: Secondary | ICD-10-CM | POA: Diagnosis not present

## 2018-06-03 DIAGNOSIS — E1129 Type 2 diabetes mellitus with other diabetic kidney complication: Secondary | ICD-10-CM | POA: Diagnosis not present

## 2018-06-03 DIAGNOSIS — D509 Iron deficiency anemia, unspecified: Secondary | ICD-10-CM | POA: Diagnosis not present

## 2018-06-05 DIAGNOSIS — E1129 Type 2 diabetes mellitus with other diabetic kidney complication: Secondary | ICD-10-CM | POA: Diagnosis not present

## 2018-06-05 DIAGNOSIS — N186 End stage renal disease: Secondary | ICD-10-CM | POA: Diagnosis not present

## 2018-06-05 DIAGNOSIS — D509 Iron deficiency anemia, unspecified: Secondary | ICD-10-CM | POA: Diagnosis not present

## 2018-06-05 DIAGNOSIS — D631 Anemia in chronic kidney disease: Secondary | ICD-10-CM | POA: Diagnosis not present

## 2018-06-05 DIAGNOSIS — N2581 Secondary hyperparathyroidism of renal origin: Secondary | ICD-10-CM | POA: Diagnosis not present

## 2018-06-08 DIAGNOSIS — N2581 Secondary hyperparathyroidism of renal origin: Secondary | ICD-10-CM | POA: Diagnosis not present

## 2018-06-08 DIAGNOSIS — N186 End stage renal disease: Secondary | ICD-10-CM | POA: Diagnosis not present

## 2018-06-08 DIAGNOSIS — E1129 Type 2 diabetes mellitus with other diabetic kidney complication: Secondary | ICD-10-CM | POA: Diagnosis not present

## 2018-06-08 DIAGNOSIS — D509 Iron deficiency anemia, unspecified: Secondary | ICD-10-CM | POA: Diagnosis not present

## 2018-06-08 DIAGNOSIS — D631 Anemia in chronic kidney disease: Secondary | ICD-10-CM | POA: Diagnosis not present

## 2018-06-10 DIAGNOSIS — E1129 Type 2 diabetes mellitus with other diabetic kidney complication: Secondary | ICD-10-CM | POA: Diagnosis not present

## 2018-06-10 DIAGNOSIS — D509 Iron deficiency anemia, unspecified: Secondary | ICD-10-CM | POA: Diagnosis not present

## 2018-06-10 DIAGNOSIS — D631 Anemia in chronic kidney disease: Secondary | ICD-10-CM | POA: Diagnosis not present

## 2018-06-10 DIAGNOSIS — N2581 Secondary hyperparathyroidism of renal origin: Secondary | ICD-10-CM | POA: Diagnosis not present

## 2018-06-10 DIAGNOSIS — N186 End stage renal disease: Secondary | ICD-10-CM | POA: Diagnosis not present

## 2018-06-15 DIAGNOSIS — N186 End stage renal disease: Secondary | ICD-10-CM | POA: Diagnosis not present

## 2018-06-15 DIAGNOSIS — E1129 Type 2 diabetes mellitus with other diabetic kidney complication: Secondary | ICD-10-CM | POA: Diagnosis not present

## 2018-06-15 DIAGNOSIS — N2581 Secondary hyperparathyroidism of renal origin: Secondary | ICD-10-CM | POA: Diagnosis not present

## 2018-06-15 DIAGNOSIS — D631 Anemia in chronic kidney disease: Secondary | ICD-10-CM | POA: Diagnosis not present

## 2018-06-15 DIAGNOSIS — D509 Iron deficiency anemia, unspecified: Secondary | ICD-10-CM | POA: Diagnosis not present

## 2018-06-17 DIAGNOSIS — N2581 Secondary hyperparathyroidism of renal origin: Secondary | ICD-10-CM | POA: Diagnosis not present

## 2018-06-17 DIAGNOSIS — D631 Anemia in chronic kidney disease: Secondary | ICD-10-CM | POA: Diagnosis not present

## 2018-06-17 DIAGNOSIS — N186 End stage renal disease: Secondary | ICD-10-CM | POA: Diagnosis not present

## 2018-06-17 DIAGNOSIS — E1129 Type 2 diabetes mellitus with other diabetic kidney complication: Secondary | ICD-10-CM | POA: Diagnosis not present

## 2018-06-17 DIAGNOSIS — D509 Iron deficiency anemia, unspecified: Secondary | ICD-10-CM | POA: Diagnosis not present

## 2018-06-19 DIAGNOSIS — D631 Anemia in chronic kidney disease: Secondary | ICD-10-CM | POA: Diagnosis not present

## 2018-06-19 DIAGNOSIS — E1129 Type 2 diabetes mellitus with other diabetic kidney complication: Secondary | ICD-10-CM | POA: Diagnosis not present

## 2018-06-19 DIAGNOSIS — D509 Iron deficiency anemia, unspecified: Secondary | ICD-10-CM | POA: Diagnosis not present

## 2018-06-19 DIAGNOSIS — N186 End stage renal disease: Secondary | ICD-10-CM | POA: Diagnosis not present

## 2018-06-19 DIAGNOSIS — N2581 Secondary hyperparathyroidism of renal origin: Secondary | ICD-10-CM | POA: Diagnosis not present

## 2018-06-22 DIAGNOSIS — N2581 Secondary hyperparathyroidism of renal origin: Secondary | ICD-10-CM | POA: Diagnosis not present

## 2018-06-22 DIAGNOSIS — D631 Anemia in chronic kidney disease: Secondary | ICD-10-CM | POA: Diagnosis not present

## 2018-06-22 DIAGNOSIS — D509 Iron deficiency anemia, unspecified: Secondary | ICD-10-CM | POA: Diagnosis not present

## 2018-06-22 DIAGNOSIS — N186 End stage renal disease: Secondary | ICD-10-CM | POA: Diagnosis not present

## 2018-06-22 DIAGNOSIS — E1129 Type 2 diabetes mellitus with other diabetic kidney complication: Secondary | ICD-10-CM | POA: Diagnosis not present

## 2018-06-23 MED FILL — SEVELAMER CARBONATE 800 MG: 800 | 30 days supply | Qty: 390 | Fill #6

## 2018-06-23 MED FILL — $Viagra 50mg tablet: 50 | 90 days supply | Qty: 30 | Fill #7

## 2018-06-23 MED FILL — AMLODIPINE BESYLATE 10 MG T: 10 | 30 days supply | Qty: 15 | Fill #3

## 2018-06-23 MED FILL — CARVEDILOL 3.125 MG TABLET: 3.125 | 30 days supply | Qty: 60 | Fill #6

## 2018-06-24 DIAGNOSIS — D631 Anemia in chronic kidney disease: Secondary | ICD-10-CM | POA: Diagnosis not present

## 2018-06-24 DIAGNOSIS — N186 End stage renal disease: Secondary | ICD-10-CM | POA: Diagnosis not present

## 2018-06-24 DIAGNOSIS — E1129 Type 2 diabetes mellitus with other diabetic kidney complication: Secondary | ICD-10-CM | POA: Diagnosis not present

## 2018-06-24 DIAGNOSIS — N2581 Secondary hyperparathyroidism of renal origin: Secondary | ICD-10-CM | POA: Diagnosis not present

## 2018-06-24 DIAGNOSIS — D509 Iron deficiency anemia, unspecified: Secondary | ICD-10-CM | POA: Diagnosis not present

## 2018-06-29 DIAGNOSIS — D631 Anemia in chronic kidney disease: Secondary | ICD-10-CM | POA: Diagnosis not present

## 2018-06-29 DIAGNOSIS — E1129 Type 2 diabetes mellitus with other diabetic kidney complication: Secondary | ICD-10-CM | POA: Diagnosis not present

## 2018-06-29 DIAGNOSIS — N186 End stage renal disease: Secondary | ICD-10-CM | POA: Diagnosis not present

## 2018-06-29 DIAGNOSIS — D509 Iron deficiency anemia, unspecified: Secondary | ICD-10-CM | POA: Diagnosis not present

## 2018-06-29 DIAGNOSIS — N2581 Secondary hyperparathyroidism of renal origin: Secondary | ICD-10-CM | POA: Diagnosis not present

## 2018-07-01 DIAGNOSIS — E1129 Type 2 diabetes mellitus with other diabetic kidney complication: Secondary | ICD-10-CM | POA: Diagnosis not present

## 2018-07-01 DIAGNOSIS — N186 End stage renal disease: Secondary | ICD-10-CM | POA: Diagnosis not present

## 2018-07-01 DIAGNOSIS — N2581 Secondary hyperparathyroidism of renal origin: Secondary | ICD-10-CM | POA: Diagnosis not present

## 2018-07-01 DIAGNOSIS — D509 Iron deficiency anemia, unspecified: Secondary | ICD-10-CM | POA: Diagnosis not present

## 2018-07-01 DIAGNOSIS — D631 Anemia in chronic kidney disease: Secondary | ICD-10-CM | POA: Diagnosis not present

## 2018-07-03 DIAGNOSIS — I129 Hypertensive chronic kidney disease with stage 1 through stage 4 chronic kidney disease, or unspecified chronic kidney disease: Secondary | ICD-10-CM | POA: Diagnosis not present

## 2018-07-03 DIAGNOSIS — D509 Iron deficiency anemia, unspecified: Secondary | ICD-10-CM | POA: Diagnosis not present

## 2018-07-03 DIAGNOSIS — N2581 Secondary hyperparathyroidism of renal origin: Secondary | ICD-10-CM | POA: Diagnosis not present

## 2018-07-03 DIAGNOSIS — E1129 Type 2 diabetes mellitus with other diabetic kidney complication: Secondary | ICD-10-CM | POA: Diagnosis not present

## 2018-07-03 DIAGNOSIS — Z992 Dependence on renal dialysis: Secondary | ICD-10-CM | POA: Diagnosis not present

## 2018-07-03 DIAGNOSIS — N186 End stage renal disease: Secondary | ICD-10-CM | POA: Diagnosis not present

## 2018-07-03 DIAGNOSIS — D631 Anemia in chronic kidney disease: Secondary | ICD-10-CM | POA: Diagnosis not present

## 2018-07-06 DIAGNOSIS — D631 Anemia in chronic kidney disease: Secondary | ICD-10-CM | POA: Diagnosis not present

## 2018-07-06 DIAGNOSIS — E1129 Type 2 diabetes mellitus with other diabetic kidney complication: Secondary | ICD-10-CM | POA: Diagnosis not present

## 2018-07-06 DIAGNOSIS — N186 End stage renal disease: Secondary | ICD-10-CM | POA: Diagnosis not present

## 2018-07-06 DIAGNOSIS — N2581 Secondary hyperparathyroidism of renal origin: Secondary | ICD-10-CM | POA: Diagnosis not present

## 2018-07-06 DIAGNOSIS — D509 Iron deficiency anemia, unspecified: Secondary | ICD-10-CM | POA: Diagnosis not present

## 2018-07-10 DIAGNOSIS — N186 End stage renal disease: Secondary | ICD-10-CM | POA: Diagnosis not present

## 2018-07-10 DIAGNOSIS — E1129 Type 2 diabetes mellitus with other diabetic kidney complication: Secondary | ICD-10-CM | POA: Diagnosis not present

## 2018-07-10 DIAGNOSIS — D631 Anemia in chronic kidney disease: Secondary | ICD-10-CM | POA: Diagnosis not present

## 2018-07-10 DIAGNOSIS — N2581 Secondary hyperparathyroidism of renal origin: Secondary | ICD-10-CM | POA: Diagnosis not present

## 2018-07-10 DIAGNOSIS — D509 Iron deficiency anemia, unspecified: Secondary | ICD-10-CM | POA: Diagnosis not present

## 2018-07-13 DIAGNOSIS — D509 Iron deficiency anemia, unspecified: Secondary | ICD-10-CM | POA: Diagnosis not present

## 2018-07-13 DIAGNOSIS — N186 End stage renal disease: Secondary | ICD-10-CM | POA: Diagnosis not present

## 2018-07-13 DIAGNOSIS — E1129 Type 2 diabetes mellitus with other diabetic kidney complication: Secondary | ICD-10-CM | POA: Diagnosis not present

## 2018-07-13 DIAGNOSIS — D631 Anemia in chronic kidney disease: Secondary | ICD-10-CM | POA: Diagnosis not present

## 2018-07-13 DIAGNOSIS — N2581 Secondary hyperparathyroidism of renal origin: Secondary | ICD-10-CM | POA: Diagnosis not present

## 2018-07-14 ENCOUNTER — Ambulatory Visit: Payer: Medicare Other | Admitting: Internal Medicine

## 2018-07-15 DIAGNOSIS — N186 End stage renal disease: Secondary | ICD-10-CM | POA: Diagnosis not present

## 2018-07-15 DIAGNOSIS — D631 Anemia in chronic kidney disease: Secondary | ICD-10-CM | POA: Diagnosis not present

## 2018-07-15 DIAGNOSIS — D509 Iron deficiency anemia, unspecified: Secondary | ICD-10-CM | POA: Diagnosis not present

## 2018-07-15 DIAGNOSIS — E1129 Type 2 diabetes mellitus with other diabetic kidney complication: Secondary | ICD-10-CM | POA: Diagnosis not present

## 2018-07-15 DIAGNOSIS — N2581 Secondary hyperparathyroidism of renal origin: Secondary | ICD-10-CM | POA: Diagnosis not present

## 2018-07-17 DIAGNOSIS — D631 Anemia in chronic kidney disease: Secondary | ICD-10-CM | POA: Diagnosis not present

## 2018-07-17 DIAGNOSIS — N2581 Secondary hyperparathyroidism of renal origin: Secondary | ICD-10-CM | POA: Diagnosis not present

## 2018-07-17 DIAGNOSIS — E1129 Type 2 diabetes mellitus with other diabetic kidney complication: Secondary | ICD-10-CM | POA: Diagnosis not present

## 2018-07-17 DIAGNOSIS — D509 Iron deficiency anemia, unspecified: Secondary | ICD-10-CM | POA: Diagnosis not present

## 2018-07-17 DIAGNOSIS — N186 End stage renal disease: Secondary | ICD-10-CM | POA: Diagnosis not present

## 2018-07-20 DIAGNOSIS — N186 End stage renal disease: Secondary | ICD-10-CM | POA: Diagnosis not present

## 2018-07-20 DIAGNOSIS — D631 Anemia in chronic kidney disease: Secondary | ICD-10-CM | POA: Diagnosis not present

## 2018-07-20 DIAGNOSIS — D509 Iron deficiency anemia, unspecified: Secondary | ICD-10-CM | POA: Diagnosis not present

## 2018-07-20 DIAGNOSIS — N2581 Secondary hyperparathyroidism of renal origin: Secondary | ICD-10-CM | POA: Diagnosis not present

## 2018-07-20 DIAGNOSIS — E1129 Type 2 diabetes mellitus with other diabetic kidney complication: Secondary | ICD-10-CM | POA: Diagnosis not present

## 2018-07-22 DIAGNOSIS — E1129 Type 2 diabetes mellitus with other diabetic kidney complication: Secondary | ICD-10-CM | POA: Diagnosis not present

## 2018-07-22 DIAGNOSIS — N186 End stage renal disease: Secondary | ICD-10-CM | POA: Diagnosis not present

## 2018-07-22 DIAGNOSIS — N2581 Secondary hyperparathyroidism of renal origin: Secondary | ICD-10-CM | POA: Diagnosis not present

## 2018-07-22 DIAGNOSIS — D631 Anemia in chronic kidney disease: Secondary | ICD-10-CM | POA: Diagnosis not present

## 2018-07-22 DIAGNOSIS — D509 Iron deficiency anemia, unspecified: Secondary | ICD-10-CM | POA: Diagnosis not present

## 2018-07-28 DIAGNOSIS — D631 Anemia in chronic kidney disease: Secondary | ICD-10-CM | POA: Diagnosis not present

## 2018-07-28 DIAGNOSIS — D509 Iron deficiency anemia, unspecified: Secondary | ICD-10-CM | POA: Diagnosis not present

## 2018-07-28 DIAGNOSIS — E1129 Type 2 diabetes mellitus with other diabetic kidney complication: Secondary | ICD-10-CM | POA: Diagnosis not present

## 2018-07-28 DIAGNOSIS — N2581 Secondary hyperparathyroidism of renal origin: Secondary | ICD-10-CM | POA: Diagnosis not present

## 2018-07-28 DIAGNOSIS — N186 End stage renal disease: Secondary | ICD-10-CM | POA: Diagnosis not present

## 2018-07-31 DIAGNOSIS — N186 End stage renal disease: Secondary | ICD-10-CM | POA: Diagnosis not present

## 2018-07-31 DIAGNOSIS — E1129 Type 2 diabetes mellitus with other diabetic kidney complication: Secondary | ICD-10-CM | POA: Diagnosis not present

## 2018-07-31 DIAGNOSIS — D631 Anemia in chronic kidney disease: Secondary | ICD-10-CM | POA: Diagnosis not present

## 2018-07-31 DIAGNOSIS — N2581 Secondary hyperparathyroidism of renal origin: Secondary | ICD-10-CM | POA: Diagnosis not present

## 2018-07-31 DIAGNOSIS — D509 Iron deficiency anemia, unspecified: Secondary | ICD-10-CM | POA: Diagnosis not present

## 2018-08-02 DIAGNOSIS — N186 End stage renal disease: Secondary | ICD-10-CM | POA: Diagnosis not present

## 2018-08-02 DIAGNOSIS — Z992 Dependence on renal dialysis: Secondary | ICD-10-CM | POA: Diagnosis not present

## 2018-08-02 DIAGNOSIS — I129 Hypertensive chronic kidney disease with stage 1 through stage 4 chronic kidney disease, or unspecified chronic kidney disease: Secondary | ICD-10-CM | POA: Diagnosis not present

## 2018-08-03 DIAGNOSIS — N186 End stage renal disease: Secondary | ICD-10-CM | POA: Diagnosis not present

## 2018-08-03 DIAGNOSIS — E1129 Type 2 diabetes mellitus with other diabetic kidney complication: Secondary | ICD-10-CM | POA: Diagnosis not present

## 2018-08-03 DIAGNOSIS — N2581 Secondary hyperparathyroidism of renal origin: Secondary | ICD-10-CM | POA: Diagnosis not present

## 2018-08-03 DIAGNOSIS — D631 Anemia in chronic kidney disease: Secondary | ICD-10-CM | POA: Diagnosis not present

## 2018-08-03 DIAGNOSIS — D509 Iron deficiency anemia, unspecified: Secondary | ICD-10-CM | POA: Diagnosis not present

## 2018-08-05 DIAGNOSIS — N186 End stage renal disease: Secondary | ICD-10-CM | POA: Diagnosis not present

## 2018-08-05 DIAGNOSIS — D509 Iron deficiency anemia, unspecified: Secondary | ICD-10-CM | POA: Diagnosis not present

## 2018-08-05 DIAGNOSIS — D631 Anemia in chronic kidney disease: Secondary | ICD-10-CM | POA: Diagnosis not present

## 2018-08-05 DIAGNOSIS — N2581 Secondary hyperparathyroidism of renal origin: Secondary | ICD-10-CM | POA: Diagnosis not present

## 2018-08-05 DIAGNOSIS — E1129 Type 2 diabetes mellitus with other diabetic kidney complication: Secondary | ICD-10-CM | POA: Diagnosis not present

## 2018-08-06 ENCOUNTER — Ambulatory Visit: Payer: Medicare Other | Attending: Internal Medicine | Admitting: Internal Medicine

## 2018-08-06 ENCOUNTER — Encounter: Payer: Self-pay | Admitting: Internal Medicine

## 2018-08-06 VITALS — BP 140/97 | HR 102 | Temp 98.0°F | Wt 240.6 lb

## 2018-08-06 DIAGNOSIS — I10 Essential (primary) hypertension: Secondary | ICD-10-CM | POA: Diagnosis not present

## 2018-08-06 DIAGNOSIS — Z8249 Family history of ischemic heart disease and other diseases of the circulatory system: Secondary | ICD-10-CM | POA: Insufficient documentation

## 2018-08-06 DIAGNOSIS — Z833 Family history of diabetes mellitus: Secondary | ICD-10-CM | POA: Diagnosis not present

## 2018-08-06 DIAGNOSIS — Z992 Dependence on renal dialysis: Secondary | ICD-10-CM | POA: Insufficient documentation

## 2018-08-06 DIAGNOSIS — I509 Heart failure, unspecified: Secondary | ICD-10-CM | POA: Insufficient documentation

## 2018-08-06 DIAGNOSIS — I132 Hypertensive heart and chronic kidney disease with heart failure and with stage 5 chronic kidney disease, or end stage renal disease: Secondary | ICD-10-CM | POA: Insufficient documentation

## 2018-08-06 DIAGNOSIS — D631 Anemia in chronic kidney disease: Secondary | ICD-10-CM | POA: Diagnosis not present

## 2018-08-06 DIAGNOSIS — N186 End stage renal disease: Secondary | ICD-10-CM | POA: Insufficient documentation

## 2018-08-06 DIAGNOSIS — H538 Other visual disturbances: Secondary | ICD-10-CM | POA: Diagnosis not present

## 2018-08-06 DIAGNOSIS — Z79899 Other long term (current) drug therapy: Secondary | ICD-10-CM | POA: Diagnosis not present

## 2018-08-06 DIAGNOSIS — E1122 Type 2 diabetes mellitus with diabetic chronic kidney disease: Secondary | ICD-10-CM | POA: Diagnosis not present

## 2018-08-06 NOTE — Progress Notes (Signed)
Patient ID: Patrick Ibarra, male    DOB: 04-04-1981  MRN: [id number]  CC: Hypertension   Subjective: Patrick Ibarra is a 37 y.o. male who presents for chronic ds management.  Wife is with him.  His concerns today include:  ESRD, HTN, DM  ESRD/HTN: Patient reports compliance with carvedilol and amlodipine.  He is only taken 1 of them for today.  He forgot to take the other 1 and does not recall which one it was.  He limit salt in the foods.  He denies any chest pains or shortness of breath at rest on exertion.  No lower extremity edema at this time.  He goes to dialysis Monday Wednesday and Fridays.   No device to check blood pressure at home.  He states that blood pressure is usually a little bit low during and after dialysis.   His wife has an FMLA form that she would like for me to complete.  She takes him to dialysis sometimes but always picks them up.  She also takes him to all of his doctors appointments.  She is requesting time for about an hour 3 days a week to get him from dialysis.  Referred for eye exam earlier this year.  Patient and wife states that they did not receive the report the appointment.  However on review of the chart it looks like Dr. Sherril Croon office had tried contacting them several times to schedule an appointment.  He would like to reschedule.    Patient with past history of diabetes.  However last 2 A1c's have been good with the last one being in the fours.  He has not been on medication in several years.  He reports that blood sugars are checked at dialysis and have always been normal.  HM:  Had flu shot at dialysis about 2 months ago.   Patient Active Problem List   Diagnosis Date Noted  . Pre-transplant evaluation for kidney transplant 09/05/2017  . Diabetes mellitus type II, uncontrolled ([place]) 09/04/2017  . Mallory-Weiss tear 09/04/2017  . Reactive depression 09/04/2017  . Pancreatitis, acute 04/04/2017  . GI bleed 04/03/2017  . Anemia of chronic disease  04/03/2017  . CHF (congestive heart failure) ([place]) 03/01/2017  . Obesity 03/01/2017  . Anemia of chronic kidney failure 03/01/2017  . Essential hypertension 01/05/2014  . Diabetes mellitus ([place]) 12/24/2011  . ESRD (end stage renal disease) ([place]) 12/24/2011     Current Outpatient Medications on File Prior to Visit  Medication Sig Dispense Refill  . ACCU-CHEK SOFTCLIX LANCETS lancets Use as instructed to check blood sugar once daily. E11.9 100 each 12  . amLODipine (NORVASC) 10 MG tablet Take 0.5 tablets (5 mg total) by mouth daily. 45 tablet 11  . Blood Glucose Monitoring Suppl (ACCU-CHEK AVIVA PLUS) w/Device KIT Use as instructed to check blood sugar once daily. E11.9 1 kit 0  . calcium acetate (PHOSLO) 667 MG capsule Take 1 capsule (667 mg total) by mouth 3 (three) times daily with meals. 90 capsule 5  . calcium carbonate (TUMS) 500 MG chewable tablet 2 tabs PO with each meal and at bedtime. (Patient not taking: Reported on 04/03/2017) 240 tablet 1  . carvedilol (COREG) 3.125 MG tablet Take 1 tablet (3.125 mg total) by mouth 2 (two) times daily with a meal. 60 tablet 11  . cinacalcet (SENSIPAR) 60 MG tablet Take 60 mg by mouth daily.    . Cinnamon 500 MG capsule Take 1,000 mg by mouth daily.    Marland Kitchen  glucose blood test strip USE AS INSTRUCTED  12  . polyethylene glycol powder (GLYCOLAX/MIRALAX) powder DISSOLVE 1 CAPFUL  17 GRAMS  IN WATER ONCE A DAY AS NEEDED FOR CONSTIPATION  6  . sevelamer carbonate (RENVELA) 800 MG tablet Take 2,400 mg by mouth 3 (three) times daily with meals.     . triamcinolone cream (KENALOG) 0.1 % Apply 1 application topically 2 (two) times daily. 80 g 1  . TRUEPLUS LANCETS 28G MISC Use as directed 100 each 1  . VIAGRA 50 MG tablet TAKE 1 TABLET BY MOUTH AS NEEDED FOR ERECTILE DYSFUNCTION  10   No current facility-administered medications on file prior to visit.     No Known Allergies  Social History   Socioeconomic History  . Marital status: Married    Spouse  name: Not on file  . Number of children: Not on file  . Years of education: Not on file  . Highest education level: Not on file  Occupational History  . Not on file  Social Needs  . Financial resource strain: Not on file  . Food insecurity:    Worry: Not on file    Inability: Not on file  . Transportation needs:    Medical: Not on file    Non-medical: Not on file  Tobacco Use  . Smoking status: Never Smoker  . Smokeless tobacco: Never Used  Substance and Sexual Activity  . Alcohol use: Yes    Comment: WINE ONCE A WEEK  . Drug use: No  . Sexual activity: Not on file  Lifestyle  . Physical activity:    Days per week: Not on file    Minutes per session: Not on file  . Stress: Not on file  Relationships  . Social connections:    Talks on phone: Not on file    Gets together: Not on file    Attends religious service: Not on file    Active member of club or organization: Not on file    Attends meetings of clubs or organizations: Not on file    Relationship status: Not on file  . Intimate partner violence:    Fear of current or ex partner: Not on file    Emotionally abused: Not on file    Physically abused: Not on file    Forced sexual activity: Not on file  Other Topics Concern  . Not on file  Social History Narrative   He is married with step kids. He works at [employer].     Family History  Problem Relation Age of Onset  . Hypertension Mother   . Diabetes Mother   . Hypertension Father     Past Surgical History:  Procedure Laterality Date  . AV FISTULA PLACEMENT Left 04/10/2017   Procedure: ARTERIOVENOUS (AV) FISTULA CREATION LEFT ARM;  Surgeon: Conrad Loomis, MD;  Location: [facility];  Service: Vascular;  Laterality: Left;  . ESOPHAGOGASTRODUODENOSCOPY (EGD) WITH PROPOFOL N/A 04/04/2017   Procedure: ESOPHAGOGASTRODUODENOSCOPY (EGD) WITH PROPOFOL;  Surgeon: Carol Ada, MD;  Location: [place] [facility];  Service: Endoscopy;  Laterality: N/A;  . INSERTION OF DIALYSIS  CATHETER Right 04/10/2017   Procedure: INSERTION OF DIALYSIS CATHETER - RIGHT INTERNAL JUGULAR PLACEMENT;  Surgeon: Conrad [place], MD;  Location: [facility];  Service: Vascular;  Laterality: Right;  . TONSILLECTOMY AND ADENOIDECTOMY      ROS: Review of Systems Negative except as above. PHYSICAL EXAM: BP (!) 140/97   Pulse (!) 102   Temp 98 F (36.7 C) (  Oral)   Wt 240 lb 9.6 oz (109.1 kg)   SpO2 98%   BMI 31.74 kg/m   BP 124/88 Physical Exam  General appearance - alert, well appearing, and in no distress Mental status - normal mood, behavior, speech, dress, motor activity, and thought processes Neck - supple, no significant adenopathy Chest - clear to auscultation, no wheezes, rales or rhonchi, symmetric air entry Heart - normal rate, regular rhythm, normal S1, S2, no murmurs, rubs, clicks or gallops Extremities -no lower extremity edema.  His dialysis graft is in the left upper extremity.  ASSESSMENT AND PLAN:  1. ESRD on hemodialysis ([place]) 2. Essential hypertension Diastolic blood pressure is a little above goal.  However he has not taken 1 of his blood pressure medicines as yet for today.  No changes made in the dose amlodipine and carvedilol.  Continue low-salt diet.  3. Blurred vision - Ambulatory referral to Ophthalmology   Patient was given the opportunity to ask questions.  Patient verbalized understanding of the plan and was able to repeat key elements of the plan.   Orders Placed This Encounter  Procedures  . Ambulatory referral to Ophthalmology     Requested Prescriptions    No prescriptions requested or ordered in this encounter    Return in about 4 months (around 12/06/2018).  Karle Plumber, MD, FACP

## 2018-08-07 DIAGNOSIS — N2581 Secondary hyperparathyroidism of renal origin: Secondary | ICD-10-CM | POA: Diagnosis not present

## 2018-08-07 DIAGNOSIS — D631 Anemia in chronic kidney disease: Secondary | ICD-10-CM | POA: Diagnosis not present

## 2018-08-07 DIAGNOSIS — D509 Iron deficiency anemia, unspecified: Secondary | ICD-10-CM | POA: Diagnosis not present

## 2018-08-07 DIAGNOSIS — N186 End stage renal disease: Secondary | ICD-10-CM | POA: Diagnosis not present

## 2018-08-07 DIAGNOSIS — E1129 Type 2 diabetes mellitus with other diabetic kidney complication: Secondary | ICD-10-CM | POA: Diagnosis not present

## 2018-08-10 DIAGNOSIS — D509 Iron deficiency anemia, unspecified: Secondary | ICD-10-CM | POA: Diagnosis not present

## 2018-08-10 DIAGNOSIS — E1129 Type 2 diabetes mellitus with other diabetic kidney complication: Secondary | ICD-10-CM | POA: Diagnosis not present

## 2018-08-10 DIAGNOSIS — D631 Anemia in chronic kidney disease: Secondary | ICD-10-CM | POA: Diagnosis not present

## 2018-08-10 DIAGNOSIS — N186 End stage renal disease: Secondary | ICD-10-CM | POA: Diagnosis not present

## 2018-08-10 DIAGNOSIS — N2581 Secondary hyperparathyroidism of renal origin: Secondary | ICD-10-CM | POA: Diagnosis not present

## 2018-08-13 ENCOUNTER — Telehealth: Payer: Self-pay

## 2018-08-13 NOTE — Telephone Encounter (Signed)
Contacted pt wife and made aware that FMLA paperwork has been faxed and originals are up front for pickup

## 2018-08-14 DIAGNOSIS — N2581 Secondary hyperparathyroidism of renal origin: Secondary | ICD-10-CM | POA: Diagnosis not present

## 2018-08-14 DIAGNOSIS — D631 Anemia in chronic kidney disease: Secondary | ICD-10-CM | POA: Diagnosis not present

## 2018-08-14 DIAGNOSIS — D509 Iron deficiency anemia, unspecified: Secondary | ICD-10-CM | POA: Diagnosis not present

## 2018-08-14 DIAGNOSIS — N186 End stage renal disease: Secondary | ICD-10-CM | POA: Diagnosis not present

## 2018-08-14 DIAGNOSIS — E1129 Type 2 diabetes mellitus with other diabetic kidney complication: Secondary | ICD-10-CM | POA: Diagnosis not present

## 2018-08-17 DIAGNOSIS — N2581 Secondary hyperparathyroidism of renal origin: Secondary | ICD-10-CM | POA: Diagnosis not present

## 2018-08-17 DIAGNOSIS — D631 Anemia in chronic kidney disease: Secondary | ICD-10-CM | POA: Diagnosis not present

## 2018-08-17 DIAGNOSIS — D509 Iron deficiency anemia, unspecified: Secondary | ICD-10-CM | POA: Diagnosis not present

## 2018-08-17 DIAGNOSIS — N186 End stage renal disease: Secondary | ICD-10-CM | POA: Diagnosis not present

## 2018-08-17 DIAGNOSIS — E1129 Type 2 diabetes mellitus with other diabetic kidney complication: Secondary | ICD-10-CM | POA: Diagnosis not present

## 2018-08-19 DIAGNOSIS — N2581 Secondary hyperparathyroidism of renal origin: Secondary | ICD-10-CM | POA: Diagnosis not present

## 2018-08-19 DIAGNOSIS — N186 End stage renal disease: Secondary | ICD-10-CM | POA: Diagnosis not present

## 2018-08-19 DIAGNOSIS — D509 Iron deficiency anemia, unspecified: Secondary | ICD-10-CM | POA: Diagnosis not present

## 2018-08-19 DIAGNOSIS — D631 Anemia in chronic kidney disease: Secondary | ICD-10-CM | POA: Diagnosis not present

## 2018-08-19 DIAGNOSIS — E1129 Type 2 diabetes mellitus with other diabetic kidney complication: Secondary | ICD-10-CM | POA: Diagnosis not present

## 2018-08-25 DIAGNOSIS — E1129 Type 2 diabetes mellitus with other diabetic kidney complication: Secondary | ICD-10-CM | POA: Diagnosis not present

## 2018-08-25 DIAGNOSIS — D509 Iron deficiency anemia, unspecified: Secondary | ICD-10-CM | POA: Diagnosis not present

## 2018-08-25 DIAGNOSIS — N186 End stage renal disease: Secondary | ICD-10-CM | POA: Diagnosis not present

## 2018-08-25 DIAGNOSIS — D631 Anemia in chronic kidney disease: Secondary | ICD-10-CM | POA: Diagnosis not present

## 2018-08-25 DIAGNOSIS — N2581 Secondary hyperparathyroidism of renal origin: Secondary | ICD-10-CM | POA: Diagnosis not present

## 2018-08-25 IMAGING — DX DG CHEST 1V PORT
2 series · 2 of 2 positions shown · non-contrast
Comparison: 03/01/2017

CLINICAL DATA: Dialysis patient.

EXAM:
PORTABLE CHEST 1 VIEW

[chest ap (1 of 2)]
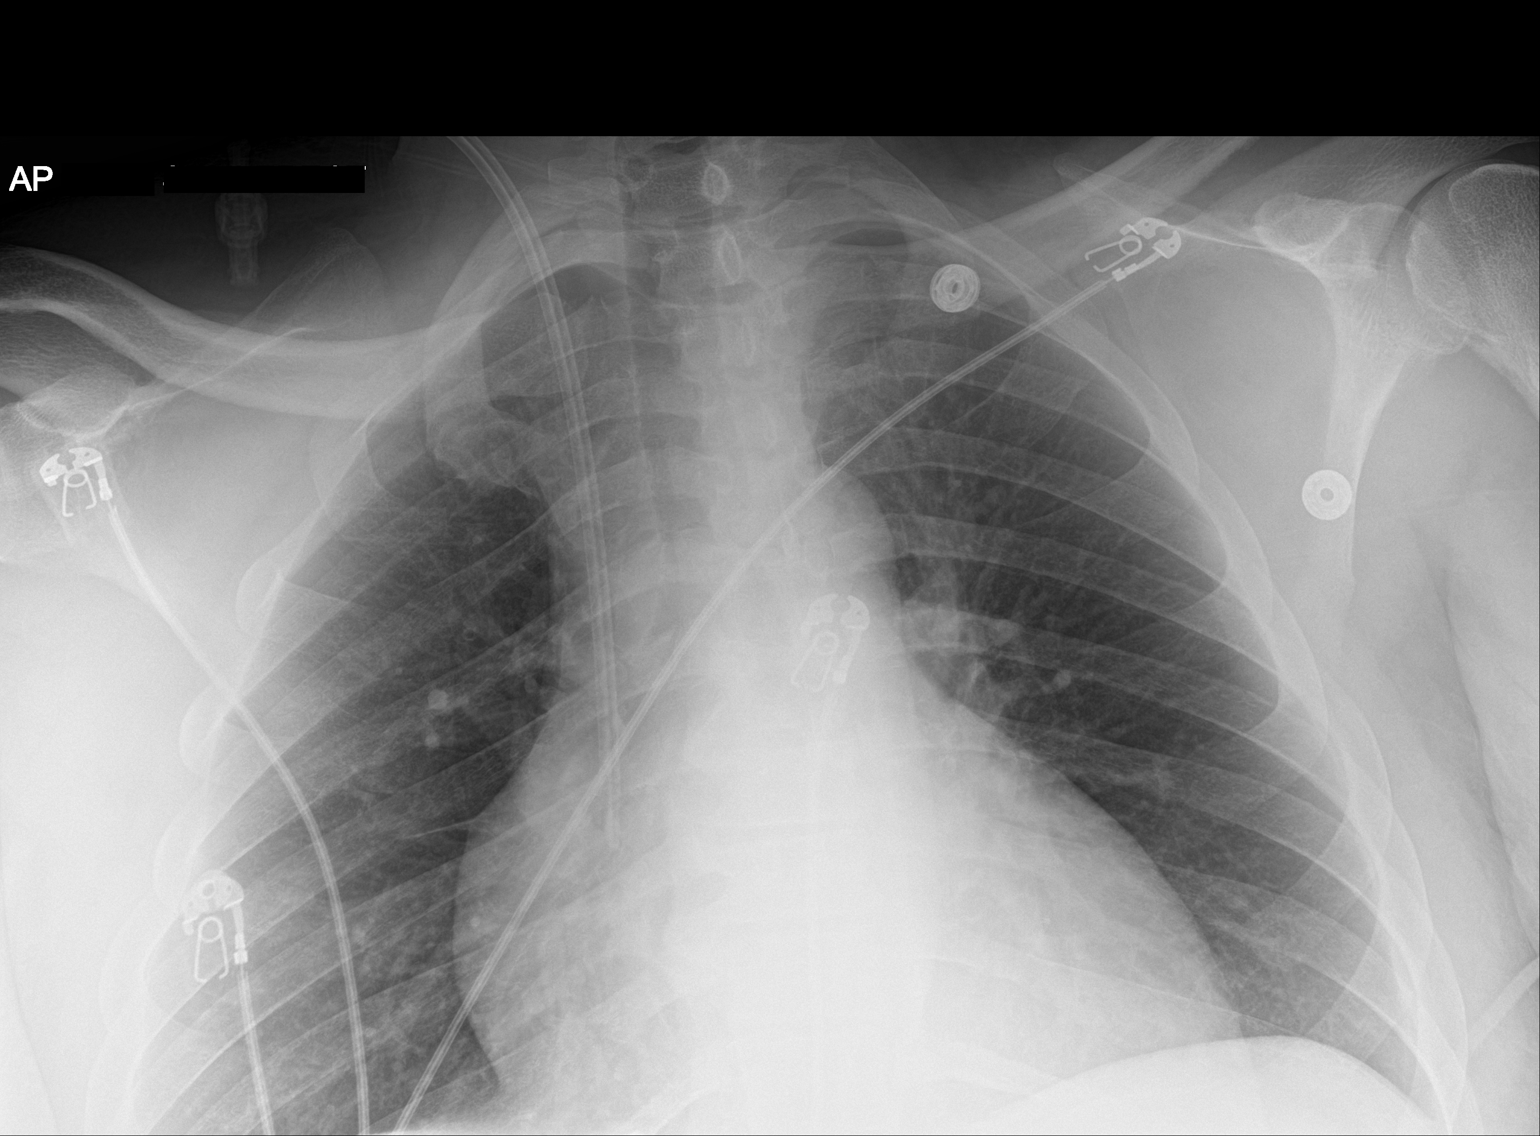

[chest ap (2 of 2)]
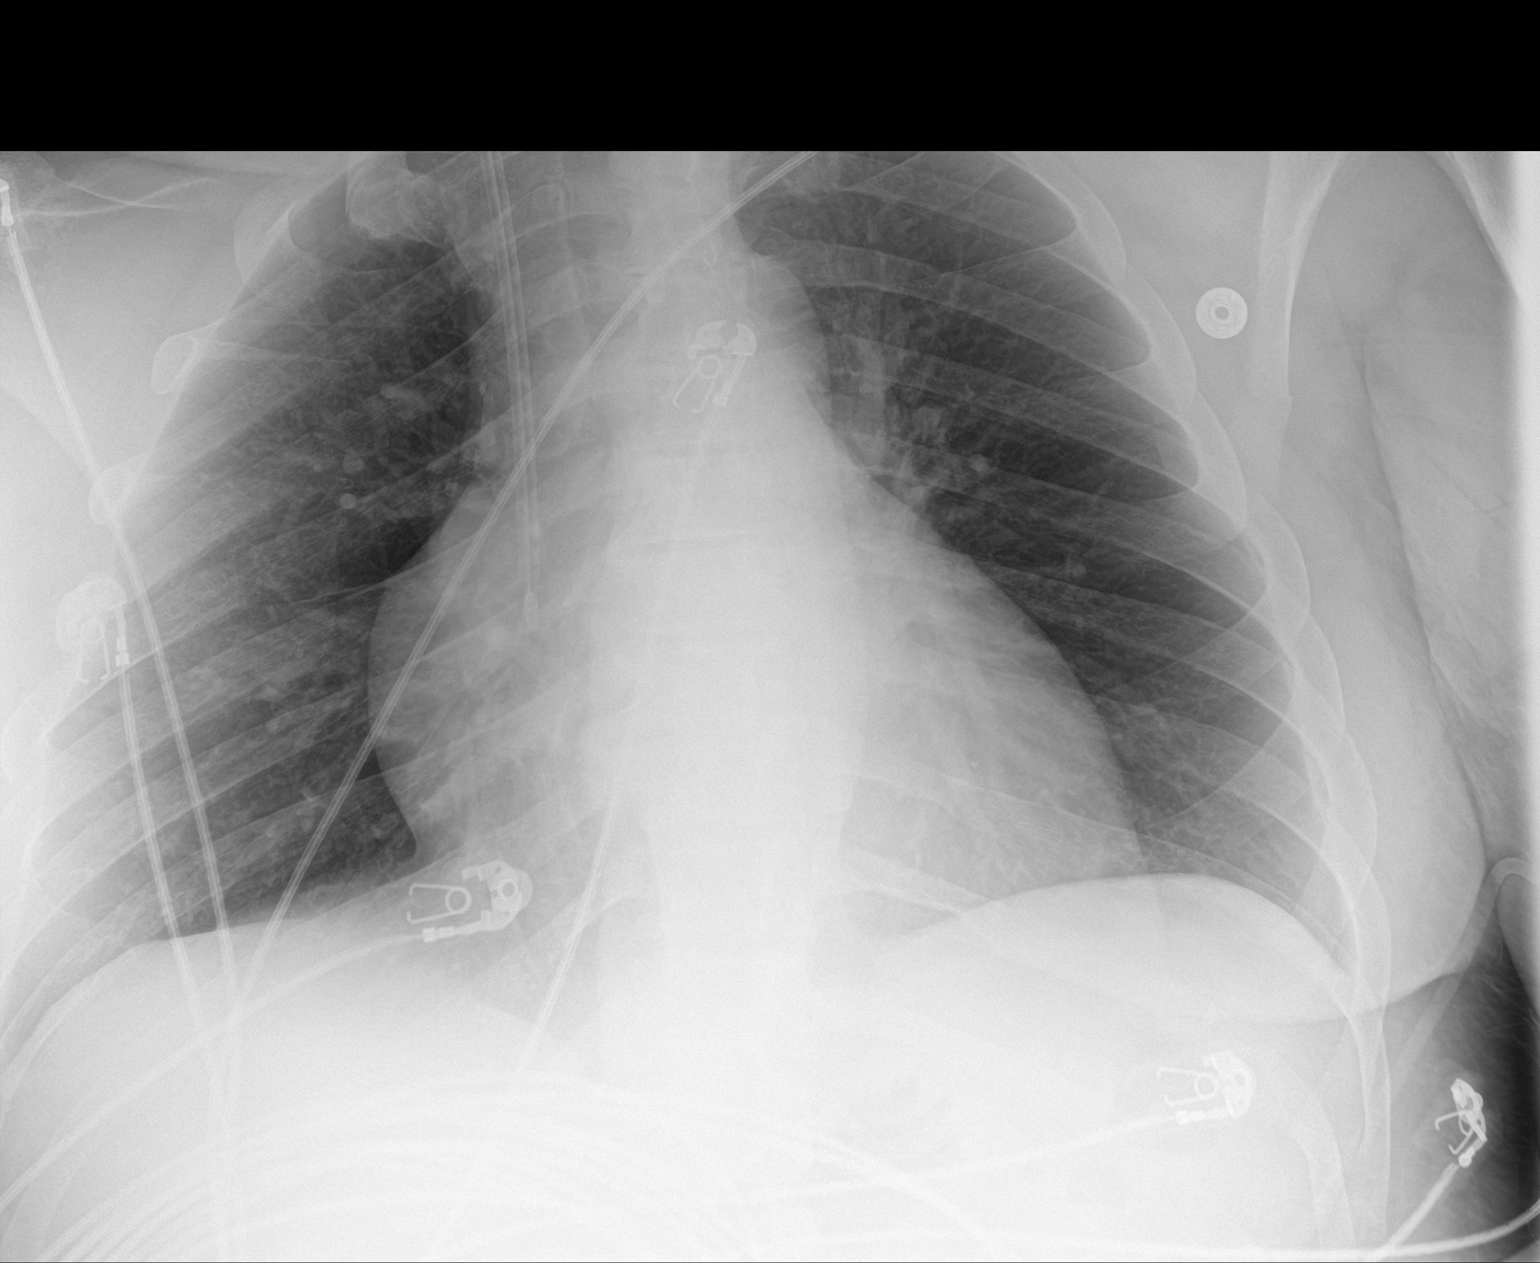

[2 of 2 positions shown; findings below may reference images not displayed]

FINDINGS: AP right central venous catheter has been placed with tip over the
cavoatrial junction region. No pneumothorax. Mild cardiac
enlargement. Normal pulmonary vascularity. Lungs appear clear and
expanded. No blunting of costophrenic angles. Mediastinal contours
appear intact.
IMPRESSION: Right central venous line placed with tip over the cavoatrial
junction region. No pneumothorax.

## 2018-08-28 DIAGNOSIS — D509 Iron deficiency anemia, unspecified: Secondary | ICD-10-CM | POA: Diagnosis not present

## 2018-08-28 DIAGNOSIS — N2581 Secondary hyperparathyroidism of renal origin: Secondary | ICD-10-CM | POA: Diagnosis not present

## 2018-08-28 DIAGNOSIS — N186 End stage renal disease: Secondary | ICD-10-CM | POA: Diagnosis not present

## 2018-08-28 DIAGNOSIS — E1129 Type 2 diabetes mellitus with other diabetic kidney complication: Secondary | ICD-10-CM | POA: Diagnosis not present

## 2018-08-28 DIAGNOSIS — D631 Anemia in chronic kidney disease: Secondary | ICD-10-CM | POA: Diagnosis not present

## 2018-09-01 DIAGNOSIS — D631 Anemia in chronic kidney disease: Secondary | ICD-10-CM | POA: Diagnosis not present

## 2018-09-01 DIAGNOSIS — D509 Iron deficiency anemia, unspecified: Secondary | ICD-10-CM | POA: Diagnosis not present

## 2018-09-01 DIAGNOSIS — E1129 Type 2 diabetes mellitus with other diabetic kidney complication: Secondary | ICD-10-CM | POA: Diagnosis not present

## 2018-09-01 DIAGNOSIS — N186 End stage renal disease: Secondary | ICD-10-CM | POA: Diagnosis not present

## 2018-09-01 DIAGNOSIS — N2581 Secondary hyperparathyroidism of renal origin: Secondary | ICD-10-CM | POA: Diagnosis not present

## 2018-09-02 DIAGNOSIS — N186 End stage renal disease: Secondary | ICD-10-CM | POA: Diagnosis not present

## 2018-09-02 DIAGNOSIS — Z992 Dependence on renal dialysis: Secondary | ICD-10-CM | POA: Diagnosis not present

## 2018-09-02 DIAGNOSIS — I129 Hypertensive chronic kidney disease with stage 1 through stage 4 chronic kidney disease, or unspecified chronic kidney disease: Secondary | ICD-10-CM | POA: Diagnosis not present

## 2018-09-04 DIAGNOSIS — N186 End stage renal disease: Secondary | ICD-10-CM | POA: Diagnosis not present

## 2018-09-04 DIAGNOSIS — D631 Anemia in chronic kidney disease: Secondary | ICD-10-CM | POA: Diagnosis not present

## 2018-09-04 DIAGNOSIS — N2581 Secondary hyperparathyroidism of renal origin: Secondary | ICD-10-CM | POA: Diagnosis not present

## 2018-09-04 DIAGNOSIS — E1129 Type 2 diabetes mellitus with other diabetic kidney complication: Secondary | ICD-10-CM | POA: Diagnosis not present

## 2018-09-04 DIAGNOSIS — D509 Iron deficiency anemia, unspecified: Secondary | ICD-10-CM | POA: Diagnosis not present

## 2018-09-07 DIAGNOSIS — E1129 Type 2 diabetes mellitus with other diabetic kidney complication: Secondary | ICD-10-CM | POA: Diagnosis not present

## 2018-09-07 DIAGNOSIS — D509 Iron deficiency anemia, unspecified: Secondary | ICD-10-CM | POA: Diagnosis not present

## 2018-09-07 DIAGNOSIS — D631 Anemia in chronic kidney disease: Secondary | ICD-10-CM | POA: Diagnosis not present

## 2018-09-07 DIAGNOSIS — N186 End stage renal disease: Secondary | ICD-10-CM | POA: Diagnosis not present

## 2018-09-07 DIAGNOSIS — N2581 Secondary hyperparathyroidism of renal origin: Secondary | ICD-10-CM | POA: Diagnosis not present

## 2018-09-09 DIAGNOSIS — N2581 Secondary hyperparathyroidism of renal origin: Secondary | ICD-10-CM | POA: Diagnosis not present

## 2018-09-09 DIAGNOSIS — D631 Anemia in chronic kidney disease: Secondary | ICD-10-CM | POA: Diagnosis not present

## 2018-09-09 DIAGNOSIS — N186 End stage renal disease: Secondary | ICD-10-CM | POA: Diagnosis not present

## 2018-09-09 DIAGNOSIS — E1129 Type 2 diabetes mellitus with other diabetic kidney complication: Secondary | ICD-10-CM | POA: Diagnosis not present

## 2018-09-09 DIAGNOSIS — D509 Iron deficiency anemia, unspecified: Secondary | ICD-10-CM | POA: Diagnosis not present

## 2018-09-11 DIAGNOSIS — D509 Iron deficiency anemia, unspecified: Secondary | ICD-10-CM | POA: Diagnosis not present

## 2018-09-11 DIAGNOSIS — D631 Anemia in chronic kidney disease: Secondary | ICD-10-CM | POA: Diagnosis not present

## 2018-09-11 DIAGNOSIS — E1129 Type 2 diabetes mellitus with other diabetic kidney complication: Secondary | ICD-10-CM | POA: Diagnosis not present

## 2018-09-11 DIAGNOSIS — N2581 Secondary hyperparathyroidism of renal origin: Secondary | ICD-10-CM | POA: Diagnosis not present

## 2018-09-11 DIAGNOSIS — N186 End stage renal disease: Secondary | ICD-10-CM | POA: Diagnosis not present

## 2018-09-16 DIAGNOSIS — N186 End stage renal disease: Secondary | ICD-10-CM | POA: Diagnosis not present

## 2018-09-16 DIAGNOSIS — N2581 Secondary hyperparathyroidism of renal origin: Secondary | ICD-10-CM | POA: Diagnosis not present

## 2018-09-16 DIAGNOSIS — D631 Anemia in chronic kidney disease: Secondary | ICD-10-CM | POA: Diagnosis not present

## 2018-09-16 DIAGNOSIS — E1129 Type 2 diabetes mellitus with other diabetic kidney complication: Secondary | ICD-10-CM | POA: Diagnosis not present

## 2018-09-16 DIAGNOSIS — D509 Iron deficiency anemia, unspecified: Secondary | ICD-10-CM | POA: Diagnosis not present

## 2018-09-18 DIAGNOSIS — D631 Anemia in chronic kidney disease: Secondary | ICD-10-CM | POA: Diagnosis not present

## 2018-09-18 DIAGNOSIS — D509 Iron deficiency anemia, unspecified: Secondary | ICD-10-CM | POA: Diagnosis not present

## 2018-09-18 DIAGNOSIS — E1129 Type 2 diabetes mellitus with other diabetic kidney complication: Secondary | ICD-10-CM | POA: Diagnosis not present

## 2018-09-18 DIAGNOSIS — N186 End stage renal disease: Secondary | ICD-10-CM | POA: Diagnosis not present

## 2018-09-18 DIAGNOSIS — N2581 Secondary hyperparathyroidism of renal origin: Secondary | ICD-10-CM | POA: Diagnosis not present

## 2018-09-23 DIAGNOSIS — N2581 Secondary hyperparathyroidism of renal origin: Secondary | ICD-10-CM | POA: Diagnosis not present

## 2018-09-23 DIAGNOSIS — N186 End stage renal disease: Secondary | ICD-10-CM | POA: Diagnosis not present

## 2018-09-23 DIAGNOSIS — D509 Iron deficiency anemia, unspecified: Secondary | ICD-10-CM | POA: Diagnosis not present

## 2018-09-23 DIAGNOSIS — D631 Anemia in chronic kidney disease: Secondary | ICD-10-CM | POA: Diagnosis not present

## 2018-09-23 DIAGNOSIS — E1129 Type 2 diabetes mellitus with other diabetic kidney complication: Secondary | ICD-10-CM | POA: Diagnosis not present

## 2018-09-25 DIAGNOSIS — D509 Iron deficiency anemia, unspecified: Secondary | ICD-10-CM | POA: Diagnosis not present

## 2018-09-25 DIAGNOSIS — E1129 Type 2 diabetes mellitus with other diabetic kidney complication: Secondary | ICD-10-CM | POA: Diagnosis not present

## 2018-09-25 DIAGNOSIS — N2581 Secondary hyperparathyroidism of renal origin: Secondary | ICD-10-CM | POA: Diagnosis not present

## 2018-09-25 DIAGNOSIS — N186 End stage renal disease: Secondary | ICD-10-CM | POA: Diagnosis not present

## 2018-09-25 DIAGNOSIS — D631 Anemia in chronic kidney disease: Secondary | ICD-10-CM | POA: Diagnosis not present

## 2018-09-28 DIAGNOSIS — N2581 Secondary hyperparathyroidism of renal origin: Secondary | ICD-10-CM | POA: Diagnosis not present

## 2018-09-28 DIAGNOSIS — D509 Iron deficiency anemia, unspecified: Secondary | ICD-10-CM | POA: Diagnosis not present

## 2018-09-28 DIAGNOSIS — N186 End stage renal disease: Secondary | ICD-10-CM | POA: Diagnosis not present

## 2018-09-28 DIAGNOSIS — D631 Anemia in chronic kidney disease: Secondary | ICD-10-CM | POA: Diagnosis not present

## 2018-09-28 DIAGNOSIS — E1129 Type 2 diabetes mellitus with other diabetic kidney complication: Secondary | ICD-10-CM | POA: Diagnosis not present

## 2018-09-30 ENCOUNTER — Other Ambulatory Visit: Payer: Self-pay | Admitting: Internal Medicine

## 2018-09-30 DIAGNOSIS — N186 End stage renal disease: Secondary | ICD-10-CM | POA: Diagnosis not present

## 2018-09-30 DIAGNOSIS — D509 Iron deficiency anemia, unspecified: Secondary | ICD-10-CM | POA: Diagnosis not present

## 2018-09-30 DIAGNOSIS — N2581 Secondary hyperparathyroidism of renal origin: Secondary | ICD-10-CM | POA: Diagnosis not present

## 2018-09-30 DIAGNOSIS — E1129 Type 2 diabetes mellitus with other diabetic kidney complication: Secondary | ICD-10-CM | POA: Diagnosis not present

## 2018-09-30 DIAGNOSIS — D631 Anemia in chronic kidney disease: Secondary | ICD-10-CM | POA: Diagnosis not present

## 2018-09-30 DIAGNOSIS — I10 Essential (primary) hypertension: Secondary | ICD-10-CM

## 2018-09-30 MED FILL — AMLODIPINE BESYLATE 10 MG T: 10 | 30 days supply | Qty: 15 | Fill #4

## 2018-10-01 MED FILL — CARVEDILOL 3.125 MG TABLET: 3.125 | 30 days supply | Qty: 60 | Fill #0

## 2018-10-02 DIAGNOSIS — D631 Anemia in chronic kidney disease: Secondary | ICD-10-CM | POA: Diagnosis not present

## 2018-10-02 DIAGNOSIS — E1129 Type 2 diabetes mellitus with other diabetic kidney complication: Secondary | ICD-10-CM | POA: Diagnosis not present

## 2018-10-02 DIAGNOSIS — D509 Iron deficiency anemia, unspecified: Secondary | ICD-10-CM | POA: Diagnosis not present

## 2018-10-02 DIAGNOSIS — N2581 Secondary hyperparathyroidism of renal origin: Secondary | ICD-10-CM | POA: Diagnosis not present

## 2018-10-02 DIAGNOSIS — N186 End stage renal disease: Secondary | ICD-10-CM | POA: Diagnosis not present

## 2018-10-03 DIAGNOSIS — N186 End stage renal disease: Secondary | ICD-10-CM | POA: Diagnosis not present

## 2018-10-03 DIAGNOSIS — I129 Hypertensive chronic kidney disease with stage 1 through stage 4 chronic kidney disease, or unspecified chronic kidney disease: Secondary | ICD-10-CM | POA: Diagnosis not present

## 2018-10-03 DIAGNOSIS — Z992 Dependence on renal dialysis: Secondary | ICD-10-CM | POA: Diagnosis not present

## 2018-10-05 DIAGNOSIS — N186 End stage renal disease: Secondary | ICD-10-CM | POA: Diagnosis not present

## 2018-10-05 DIAGNOSIS — D631 Anemia in chronic kidney disease: Secondary | ICD-10-CM | POA: Diagnosis not present

## 2018-10-05 DIAGNOSIS — E1129 Type 2 diabetes mellitus with other diabetic kidney complication: Secondary | ICD-10-CM | POA: Diagnosis not present

## 2018-10-05 DIAGNOSIS — N2581 Secondary hyperparathyroidism of renal origin: Secondary | ICD-10-CM | POA: Diagnosis not present

## 2018-10-05 DIAGNOSIS — D509 Iron deficiency anemia, unspecified: Secondary | ICD-10-CM | POA: Diagnosis not present

## 2018-10-07 DIAGNOSIS — D631 Anemia in chronic kidney disease: Secondary | ICD-10-CM | POA: Diagnosis not present

## 2018-10-07 DIAGNOSIS — E1129 Type 2 diabetes mellitus with other diabetic kidney complication: Secondary | ICD-10-CM | POA: Diagnosis not present

## 2018-10-07 DIAGNOSIS — N2581 Secondary hyperparathyroidism of renal origin: Secondary | ICD-10-CM | POA: Diagnosis not present

## 2018-10-07 DIAGNOSIS — D509 Iron deficiency anemia, unspecified: Secondary | ICD-10-CM | POA: Diagnosis not present

## 2018-10-07 DIAGNOSIS — N186 End stage renal disease: Secondary | ICD-10-CM | POA: Diagnosis not present

## 2018-10-09 DIAGNOSIS — D509 Iron deficiency anemia, unspecified: Secondary | ICD-10-CM | POA: Diagnosis not present

## 2018-10-09 DIAGNOSIS — D631 Anemia in chronic kidney disease: Secondary | ICD-10-CM | POA: Diagnosis not present

## 2018-10-09 DIAGNOSIS — N186 End stage renal disease: Secondary | ICD-10-CM | POA: Diagnosis not present

## 2018-10-09 DIAGNOSIS — E1129 Type 2 diabetes mellitus with other diabetic kidney complication: Secondary | ICD-10-CM | POA: Diagnosis not present

## 2018-10-09 DIAGNOSIS — N2581 Secondary hyperparathyroidism of renal origin: Secondary | ICD-10-CM | POA: Diagnosis not present

## 2018-10-14 DIAGNOSIS — N2581 Secondary hyperparathyroidism of renal origin: Secondary | ICD-10-CM | POA: Diagnosis not present

## 2018-10-14 DIAGNOSIS — D631 Anemia in chronic kidney disease: Secondary | ICD-10-CM | POA: Diagnosis not present

## 2018-10-14 DIAGNOSIS — E1129 Type 2 diabetes mellitus with other diabetic kidney complication: Secondary | ICD-10-CM | POA: Diagnosis not present

## 2018-10-14 DIAGNOSIS — N186 End stage renal disease: Secondary | ICD-10-CM | POA: Diagnosis not present

## 2018-10-14 DIAGNOSIS — D509 Iron deficiency anemia, unspecified: Secondary | ICD-10-CM | POA: Diagnosis not present

## 2018-10-16 DIAGNOSIS — D631 Anemia in chronic kidney disease: Secondary | ICD-10-CM | POA: Diagnosis not present

## 2018-10-16 DIAGNOSIS — D509 Iron deficiency anemia, unspecified: Secondary | ICD-10-CM | POA: Diagnosis not present

## 2018-10-16 DIAGNOSIS — N2581 Secondary hyperparathyroidism of renal origin: Secondary | ICD-10-CM | POA: Diagnosis not present

## 2018-10-16 DIAGNOSIS — E1129 Type 2 diabetes mellitus with other diabetic kidney complication: Secondary | ICD-10-CM | POA: Diagnosis not present

## 2018-10-16 DIAGNOSIS — N186 End stage renal disease: Secondary | ICD-10-CM | POA: Diagnosis not present

## 2018-10-19 DIAGNOSIS — D631 Anemia in chronic kidney disease: Secondary | ICD-10-CM | POA: Diagnosis not present

## 2018-10-19 DIAGNOSIS — E1129 Type 2 diabetes mellitus with other diabetic kidney complication: Secondary | ICD-10-CM | POA: Diagnosis not present

## 2018-10-19 DIAGNOSIS — D509 Iron deficiency anemia, unspecified: Secondary | ICD-10-CM | POA: Diagnosis not present

## 2018-10-19 DIAGNOSIS — N186 End stage renal disease: Secondary | ICD-10-CM | POA: Diagnosis not present

## 2018-10-19 DIAGNOSIS — N2581 Secondary hyperparathyroidism of renal origin: Secondary | ICD-10-CM | POA: Diagnosis not present

## 2018-10-23 DIAGNOSIS — N2581 Secondary hyperparathyroidism of renal origin: Secondary | ICD-10-CM | POA: Diagnosis not present

## 2018-10-23 DIAGNOSIS — D631 Anemia in chronic kidney disease: Secondary | ICD-10-CM | POA: Diagnosis not present

## 2018-10-23 DIAGNOSIS — N186 End stage renal disease: Secondary | ICD-10-CM | POA: Diagnosis not present

## 2018-10-23 DIAGNOSIS — E1129 Type 2 diabetes mellitus with other diabetic kidney complication: Secondary | ICD-10-CM | POA: Diagnosis not present

## 2018-10-23 DIAGNOSIS — D509 Iron deficiency anemia, unspecified: Secondary | ICD-10-CM | POA: Diagnosis not present

## 2018-10-26 DIAGNOSIS — N2581 Secondary hyperparathyroidism of renal origin: Secondary | ICD-10-CM | POA: Diagnosis not present

## 2018-10-26 DIAGNOSIS — E1129 Type 2 diabetes mellitus with other diabetic kidney complication: Secondary | ICD-10-CM | POA: Diagnosis not present

## 2018-10-26 DIAGNOSIS — D509 Iron deficiency anemia, unspecified: Secondary | ICD-10-CM | POA: Diagnosis not present

## 2018-10-26 DIAGNOSIS — N186 End stage renal disease: Secondary | ICD-10-CM | POA: Diagnosis not present

## 2018-10-26 DIAGNOSIS — D631 Anemia in chronic kidney disease: Secondary | ICD-10-CM | POA: Diagnosis not present

## 2018-10-28 DIAGNOSIS — N186 End stage renal disease: Secondary | ICD-10-CM | POA: Diagnosis not present

## 2018-10-28 DIAGNOSIS — D631 Anemia in chronic kidney disease: Secondary | ICD-10-CM | POA: Diagnosis not present

## 2018-10-28 DIAGNOSIS — E1129 Type 2 diabetes mellitus with other diabetic kidney complication: Secondary | ICD-10-CM | POA: Diagnosis not present

## 2018-10-28 DIAGNOSIS — N2581 Secondary hyperparathyroidism of renal origin: Secondary | ICD-10-CM | POA: Diagnosis not present

## 2018-10-28 DIAGNOSIS — D509 Iron deficiency anemia, unspecified: Secondary | ICD-10-CM | POA: Diagnosis not present

## 2018-10-30 DIAGNOSIS — D631 Anemia in chronic kidney disease: Secondary | ICD-10-CM | POA: Diagnosis not present

## 2018-10-30 DIAGNOSIS — N2581 Secondary hyperparathyroidism of renal origin: Secondary | ICD-10-CM | POA: Diagnosis not present

## 2018-10-30 DIAGNOSIS — E1129 Type 2 diabetes mellitus with other diabetic kidney complication: Secondary | ICD-10-CM | POA: Diagnosis not present

## 2018-10-30 DIAGNOSIS — D509 Iron deficiency anemia, unspecified: Secondary | ICD-10-CM | POA: Diagnosis not present

## 2018-10-30 DIAGNOSIS — N186 End stage renal disease: Secondary | ICD-10-CM | POA: Diagnosis not present

## 2018-11-01 DIAGNOSIS — N186 End stage renal disease: Secondary | ICD-10-CM | POA: Diagnosis not present

## 2018-11-01 DIAGNOSIS — Z992 Dependence on renal dialysis: Secondary | ICD-10-CM | POA: Diagnosis not present

## 2018-11-01 DIAGNOSIS — I129 Hypertensive chronic kidney disease with stage 1 through stage 4 chronic kidney disease, or unspecified chronic kidney disease: Secondary | ICD-10-CM | POA: Diagnosis not present

## 2018-11-02 DIAGNOSIS — N186 End stage renal disease: Secondary | ICD-10-CM | POA: Diagnosis not present

## 2018-11-02 DIAGNOSIS — E1129 Type 2 diabetes mellitus with other diabetic kidney complication: Secondary | ICD-10-CM | POA: Diagnosis not present

## 2018-11-02 DIAGNOSIS — D631 Anemia in chronic kidney disease: Secondary | ICD-10-CM | POA: Diagnosis not present

## 2018-11-02 DIAGNOSIS — N2581 Secondary hyperparathyroidism of renal origin: Secondary | ICD-10-CM | POA: Diagnosis not present

## 2018-11-02 DIAGNOSIS — D509 Iron deficiency anemia, unspecified: Secondary | ICD-10-CM | POA: Diagnosis not present

## 2018-11-02 DIAGNOSIS — E8779 Other fluid overload: Secondary | ICD-10-CM | POA: Diagnosis not present

## 2018-11-04 DIAGNOSIS — N2581 Secondary hyperparathyroidism of renal origin: Secondary | ICD-10-CM | POA: Diagnosis not present

## 2018-11-04 DIAGNOSIS — E1129 Type 2 diabetes mellitus with other diabetic kidney complication: Secondary | ICD-10-CM | POA: Diagnosis not present

## 2018-11-04 DIAGNOSIS — N186 End stage renal disease: Secondary | ICD-10-CM | POA: Diagnosis not present

## 2018-11-04 DIAGNOSIS — E8779 Other fluid overload: Secondary | ICD-10-CM | POA: Diagnosis not present

## 2018-11-04 DIAGNOSIS — D509 Iron deficiency anemia, unspecified: Secondary | ICD-10-CM | POA: Diagnosis not present

## 2018-11-04 DIAGNOSIS — D631 Anemia in chronic kidney disease: Secondary | ICD-10-CM | POA: Diagnosis not present

## 2018-11-11 DIAGNOSIS — E8779 Other fluid overload: Secondary | ICD-10-CM | POA: Diagnosis not present

## 2018-11-11 DIAGNOSIS — N2581 Secondary hyperparathyroidism of renal origin: Secondary | ICD-10-CM | POA: Diagnosis not present

## 2018-11-11 DIAGNOSIS — D631 Anemia in chronic kidney disease: Secondary | ICD-10-CM | POA: Diagnosis not present

## 2018-11-11 DIAGNOSIS — E1129 Type 2 diabetes mellitus with other diabetic kidney complication: Secondary | ICD-10-CM | POA: Diagnosis not present

## 2018-11-11 DIAGNOSIS — N186 End stage renal disease: Secondary | ICD-10-CM | POA: Diagnosis not present

## 2018-11-11 DIAGNOSIS — D509 Iron deficiency anemia, unspecified: Secondary | ICD-10-CM | POA: Diagnosis not present

## 2018-11-12 MED FILL — AMLODIPINE BESYLATE 5 MG TA: 5 | 30 days supply | Qty: 30 | Fill #0

## 2018-11-12 MED FILL — CARVEDILOL 3.125 MG TABLET: 3.125 | 30 days supply | Qty: 60 | Fill #0

## 2018-11-13 DIAGNOSIS — E8779 Other fluid overload: Secondary | ICD-10-CM | POA: Diagnosis not present

## 2018-11-13 DIAGNOSIS — D631 Anemia in chronic kidney disease: Secondary | ICD-10-CM | POA: Diagnosis not present

## 2018-11-13 DIAGNOSIS — N2581 Secondary hyperparathyroidism of renal origin: Secondary | ICD-10-CM | POA: Diagnosis not present

## 2018-11-13 DIAGNOSIS — D509 Iron deficiency anemia, unspecified: Secondary | ICD-10-CM | POA: Diagnosis not present

## 2018-11-13 DIAGNOSIS — N186 End stage renal disease: Secondary | ICD-10-CM | POA: Diagnosis not present

## 2018-11-13 DIAGNOSIS — E1129 Type 2 diabetes mellitus with other diabetic kidney complication: Secondary | ICD-10-CM | POA: Diagnosis not present

## 2018-11-14 DIAGNOSIS — N2581 Secondary hyperparathyroidism of renal origin: Secondary | ICD-10-CM | POA: Diagnosis not present

## 2018-11-14 DIAGNOSIS — D509 Iron deficiency anemia, unspecified: Secondary | ICD-10-CM | POA: Diagnosis not present

## 2018-11-14 DIAGNOSIS — E1129 Type 2 diabetes mellitus with other diabetic kidney complication: Secondary | ICD-10-CM | POA: Diagnosis not present

## 2018-11-14 DIAGNOSIS — N186 End stage renal disease: Secondary | ICD-10-CM | POA: Diagnosis not present

## 2018-11-14 DIAGNOSIS — E8779 Other fluid overload: Secondary | ICD-10-CM | POA: Diagnosis not present

## 2018-11-14 DIAGNOSIS — D631 Anemia in chronic kidney disease: Secondary | ICD-10-CM | POA: Diagnosis not present

## 2018-11-16 DIAGNOSIS — E8779 Other fluid overload: Secondary | ICD-10-CM | POA: Diagnosis not present

## 2018-11-16 DIAGNOSIS — E1129 Type 2 diabetes mellitus with other diabetic kidney complication: Secondary | ICD-10-CM | POA: Diagnosis not present

## 2018-11-16 DIAGNOSIS — N2581 Secondary hyperparathyroidism of renal origin: Secondary | ICD-10-CM | POA: Diagnosis not present

## 2018-11-16 DIAGNOSIS — D509 Iron deficiency anemia, unspecified: Secondary | ICD-10-CM | POA: Diagnosis not present

## 2018-11-16 DIAGNOSIS — D631 Anemia in chronic kidney disease: Secondary | ICD-10-CM | POA: Diagnosis not present

## 2018-11-16 DIAGNOSIS — N186 End stage renal disease: Secondary | ICD-10-CM | POA: Diagnosis not present

## 2018-11-18 DIAGNOSIS — N186 End stage renal disease: Secondary | ICD-10-CM | POA: Diagnosis not present

## 2018-11-18 DIAGNOSIS — N2581 Secondary hyperparathyroidism of renal origin: Secondary | ICD-10-CM | POA: Diagnosis not present

## 2018-11-18 DIAGNOSIS — D509 Iron deficiency anemia, unspecified: Secondary | ICD-10-CM | POA: Diagnosis not present

## 2018-11-18 DIAGNOSIS — E1129 Type 2 diabetes mellitus with other diabetic kidney complication: Secondary | ICD-10-CM | POA: Diagnosis not present

## 2018-11-18 DIAGNOSIS — D631 Anemia in chronic kidney disease: Secondary | ICD-10-CM | POA: Diagnosis not present

## 2018-11-18 DIAGNOSIS — E8779 Other fluid overload: Secondary | ICD-10-CM | POA: Diagnosis not present

## 2018-11-20 DIAGNOSIS — D631 Anemia in chronic kidney disease: Secondary | ICD-10-CM | POA: Diagnosis not present

## 2018-11-20 DIAGNOSIS — D509 Iron deficiency anemia, unspecified: Secondary | ICD-10-CM | POA: Diagnosis not present

## 2018-11-20 DIAGNOSIS — E1129 Type 2 diabetes mellitus with other diabetic kidney complication: Secondary | ICD-10-CM | POA: Diagnosis not present

## 2018-11-20 DIAGNOSIS — N2581 Secondary hyperparathyroidism of renal origin: Secondary | ICD-10-CM | POA: Diagnosis not present

## 2018-11-20 DIAGNOSIS — E8779 Other fluid overload: Secondary | ICD-10-CM | POA: Diagnosis not present

## 2018-11-20 DIAGNOSIS — N186 End stage renal disease: Secondary | ICD-10-CM | POA: Diagnosis not present

## 2018-11-23 DIAGNOSIS — N186 End stage renal disease: Secondary | ICD-10-CM | POA: Diagnosis not present

## 2018-11-23 DIAGNOSIS — D509 Iron deficiency anemia, unspecified: Secondary | ICD-10-CM | POA: Diagnosis not present

## 2018-11-23 DIAGNOSIS — D631 Anemia in chronic kidney disease: Secondary | ICD-10-CM | POA: Diagnosis not present

## 2018-11-23 DIAGNOSIS — N2581 Secondary hyperparathyroidism of renal origin: Secondary | ICD-10-CM | POA: Diagnosis not present

## 2018-11-23 DIAGNOSIS — E1129 Type 2 diabetes mellitus with other diabetic kidney complication: Secondary | ICD-10-CM | POA: Diagnosis not present

## 2018-11-23 DIAGNOSIS — E8779 Other fluid overload: Secondary | ICD-10-CM | POA: Diagnosis not present

## 2018-11-25 DIAGNOSIS — D509 Iron deficiency anemia, unspecified: Secondary | ICD-10-CM | POA: Diagnosis not present

## 2018-11-25 DIAGNOSIS — D631 Anemia in chronic kidney disease: Secondary | ICD-10-CM | POA: Diagnosis not present

## 2018-11-25 DIAGNOSIS — E1129 Type 2 diabetes mellitus with other diabetic kidney complication: Secondary | ICD-10-CM | POA: Diagnosis not present

## 2018-11-25 DIAGNOSIS — E8779 Other fluid overload: Secondary | ICD-10-CM | POA: Diagnosis not present

## 2018-11-25 DIAGNOSIS — N2581 Secondary hyperparathyroidism of renal origin: Secondary | ICD-10-CM | POA: Diagnosis not present

## 2018-11-25 DIAGNOSIS — N186 End stage renal disease: Secondary | ICD-10-CM | POA: Diagnosis not present

## 2018-11-27 DIAGNOSIS — D509 Iron deficiency anemia, unspecified: Secondary | ICD-10-CM | POA: Diagnosis not present

## 2018-11-27 DIAGNOSIS — N2581 Secondary hyperparathyroidism of renal origin: Secondary | ICD-10-CM | POA: Diagnosis not present

## 2018-11-27 DIAGNOSIS — E1129 Type 2 diabetes mellitus with other diabetic kidney complication: Secondary | ICD-10-CM | POA: Diagnosis not present

## 2018-11-27 DIAGNOSIS — N186 End stage renal disease: Secondary | ICD-10-CM | POA: Diagnosis not present

## 2018-11-27 DIAGNOSIS — E8779 Other fluid overload: Secondary | ICD-10-CM | POA: Diagnosis not present

## 2018-11-27 DIAGNOSIS — D631 Anemia in chronic kidney disease: Secondary | ICD-10-CM | POA: Diagnosis not present

## 2018-11-30 DIAGNOSIS — N186 End stage renal disease: Secondary | ICD-10-CM | POA: Diagnosis not present

## 2018-11-30 DIAGNOSIS — D631 Anemia in chronic kidney disease: Secondary | ICD-10-CM | POA: Diagnosis not present

## 2018-11-30 DIAGNOSIS — D509 Iron deficiency anemia, unspecified: Secondary | ICD-10-CM | POA: Diagnosis not present

## 2018-11-30 DIAGNOSIS — E1129 Type 2 diabetes mellitus with other diabetic kidney complication: Secondary | ICD-10-CM | POA: Diagnosis not present

## 2018-11-30 DIAGNOSIS — E8779 Other fluid overload: Secondary | ICD-10-CM | POA: Diagnosis not present

## 2018-11-30 DIAGNOSIS — N2581 Secondary hyperparathyroidism of renal origin: Secondary | ICD-10-CM | POA: Diagnosis not present

## 2018-12-02 DIAGNOSIS — E8779 Other fluid overload: Secondary | ICD-10-CM | POA: Diagnosis not present

## 2018-12-02 DIAGNOSIS — N2581 Secondary hyperparathyroidism of renal origin: Secondary | ICD-10-CM | POA: Diagnosis not present

## 2018-12-02 DIAGNOSIS — Z992 Dependence on renal dialysis: Secondary | ICD-10-CM | POA: Diagnosis not present

## 2018-12-02 DIAGNOSIS — D631 Anemia in chronic kidney disease: Secondary | ICD-10-CM | POA: Diagnosis not present

## 2018-12-02 DIAGNOSIS — N186 End stage renal disease: Secondary | ICD-10-CM | POA: Diagnosis not present

## 2018-12-02 DIAGNOSIS — I129 Hypertensive chronic kidney disease with stage 1 through stage 4 chronic kidney disease, or unspecified chronic kidney disease: Secondary | ICD-10-CM | POA: Diagnosis not present

## 2018-12-02 DIAGNOSIS — E1129 Type 2 diabetes mellitus with other diabetic kidney complication: Secondary | ICD-10-CM | POA: Diagnosis not present

## 2018-12-02 DIAGNOSIS — D509 Iron deficiency anemia, unspecified: Secondary | ICD-10-CM | POA: Diagnosis not present

## 2018-12-04 DIAGNOSIS — E1129 Type 2 diabetes mellitus with other diabetic kidney complication: Secondary | ICD-10-CM | POA: Diagnosis not present

## 2018-12-04 DIAGNOSIS — D509 Iron deficiency anemia, unspecified: Secondary | ICD-10-CM | POA: Diagnosis not present

## 2018-12-04 DIAGNOSIS — D631 Anemia in chronic kidney disease: Secondary | ICD-10-CM | POA: Diagnosis not present

## 2018-12-04 DIAGNOSIS — E8779 Other fluid overload: Secondary | ICD-10-CM | POA: Diagnosis not present

## 2018-12-04 DIAGNOSIS — N2581 Secondary hyperparathyroidism of renal origin: Secondary | ICD-10-CM | POA: Diagnosis not present

## 2018-12-04 DIAGNOSIS — N186 End stage renal disease: Secondary | ICD-10-CM | POA: Diagnosis not present

## 2018-12-07 DIAGNOSIS — D509 Iron deficiency anemia, unspecified: Secondary | ICD-10-CM | POA: Diagnosis not present

## 2018-12-07 DIAGNOSIS — D631 Anemia in chronic kidney disease: Secondary | ICD-10-CM | POA: Diagnosis not present

## 2018-12-07 DIAGNOSIS — E1129 Type 2 diabetes mellitus with other diabetic kidney complication: Secondary | ICD-10-CM | POA: Diagnosis not present

## 2018-12-07 DIAGNOSIS — E8779 Other fluid overload: Secondary | ICD-10-CM | POA: Diagnosis not present

## 2018-12-07 DIAGNOSIS — N186 End stage renal disease: Secondary | ICD-10-CM | POA: Diagnosis not present

## 2018-12-07 DIAGNOSIS — N2581 Secondary hyperparathyroidism of renal origin: Secondary | ICD-10-CM | POA: Diagnosis not present

## 2018-12-09 DIAGNOSIS — N2581 Secondary hyperparathyroidism of renal origin: Secondary | ICD-10-CM | POA: Diagnosis not present

## 2018-12-09 DIAGNOSIS — D631 Anemia in chronic kidney disease: Secondary | ICD-10-CM | POA: Diagnosis not present

## 2018-12-09 DIAGNOSIS — E1129 Type 2 diabetes mellitus with other diabetic kidney complication: Secondary | ICD-10-CM | POA: Diagnosis not present

## 2018-12-09 DIAGNOSIS — D509 Iron deficiency anemia, unspecified: Secondary | ICD-10-CM | POA: Diagnosis not present

## 2018-12-09 DIAGNOSIS — N186 End stage renal disease: Secondary | ICD-10-CM | POA: Diagnosis not present

## 2018-12-09 DIAGNOSIS — E8779 Other fluid overload: Secondary | ICD-10-CM | POA: Diagnosis not present

## 2018-12-10 ENCOUNTER — Ambulatory Visit: Payer: Medicare Other | Admitting: Internal Medicine

## 2018-12-11 DIAGNOSIS — E8779 Other fluid overload: Secondary | ICD-10-CM | POA: Diagnosis not present

## 2018-12-11 DIAGNOSIS — N2581 Secondary hyperparathyroidism of renal origin: Secondary | ICD-10-CM | POA: Diagnosis not present

## 2018-12-11 DIAGNOSIS — D509 Iron deficiency anemia, unspecified: Secondary | ICD-10-CM | POA: Diagnosis not present

## 2018-12-11 DIAGNOSIS — N186 End stage renal disease: Secondary | ICD-10-CM | POA: Diagnosis not present

## 2018-12-11 DIAGNOSIS — D631 Anemia in chronic kidney disease: Secondary | ICD-10-CM | POA: Diagnosis not present

## 2018-12-11 DIAGNOSIS — E1129 Type 2 diabetes mellitus with other diabetic kidney complication: Secondary | ICD-10-CM | POA: Diagnosis not present

## 2018-12-12 NOTE — Telephone Encounter (Signed)
done

## 2018-12-12 NOTE — Telephone Encounter (Signed)
ete

## 2018-12-14 DIAGNOSIS — N2581 Secondary hyperparathyroidism of renal origin: Secondary | ICD-10-CM | POA: Diagnosis not present

## 2018-12-14 DIAGNOSIS — D631 Anemia in chronic kidney disease: Secondary | ICD-10-CM | POA: Diagnosis not present

## 2018-12-14 DIAGNOSIS — N186 End stage renal disease: Secondary | ICD-10-CM | POA: Diagnosis not present

## 2018-12-14 DIAGNOSIS — E1129 Type 2 diabetes mellitus with other diabetic kidney complication: Secondary | ICD-10-CM | POA: Diagnosis not present

## 2018-12-14 DIAGNOSIS — E8779 Other fluid overload: Secondary | ICD-10-CM | POA: Diagnosis not present

## 2018-12-14 DIAGNOSIS — D509 Iron deficiency anemia, unspecified: Secondary | ICD-10-CM | POA: Diagnosis not present

## 2018-12-16 DIAGNOSIS — N2581 Secondary hyperparathyroidism of renal origin: Secondary | ICD-10-CM | POA: Diagnosis not present

## 2018-12-16 DIAGNOSIS — D509 Iron deficiency anemia, unspecified: Secondary | ICD-10-CM | POA: Diagnosis not present

## 2018-12-16 DIAGNOSIS — E1129 Type 2 diabetes mellitus with other diabetic kidney complication: Secondary | ICD-10-CM | POA: Diagnosis not present

## 2018-12-16 DIAGNOSIS — N186 End stage renal disease: Secondary | ICD-10-CM | POA: Diagnosis not present

## 2018-12-16 DIAGNOSIS — D631 Anemia in chronic kidney disease: Secondary | ICD-10-CM | POA: Diagnosis not present

## 2018-12-16 DIAGNOSIS — E8779 Other fluid overload: Secondary | ICD-10-CM | POA: Diagnosis not present

## 2018-12-18 DIAGNOSIS — D509 Iron deficiency anemia, unspecified: Secondary | ICD-10-CM | POA: Diagnosis not present

## 2018-12-18 DIAGNOSIS — D631 Anemia in chronic kidney disease: Secondary | ICD-10-CM | POA: Diagnosis not present

## 2018-12-18 DIAGNOSIS — E8779 Other fluid overload: Secondary | ICD-10-CM | POA: Diagnosis not present

## 2018-12-18 DIAGNOSIS — N2581 Secondary hyperparathyroidism of renal origin: Secondary | ICD-10-CM | POA: Diagnosis not present

## 2018-12-18 DIAGNOSIS — E1129 Type 2 diabetes mellitus with other diabetic kidney complication: Secondary | ICD-10-CM | POA: Diagnosis not present

## 2018-12-18 DIAGNOSIS — N186 End stage renal disease: Secondary | ICD-10-CM | POA: Diagnosis not present

## 2018-12-21 DIAGNOSIS — E8779 Other fluid overload: Secondary | ICD-10-CM | POA: Diagnosis not present

## 2018-12-21 DIAGNOSIS — N2581 Secondary hyperparathyroidism of renal origin: Secondary | ICD-10-CM | POA: Diagnosis not present

## 2018-12-21 DIAGNOSIS — E1129 Type 2 diabetes mellitus with other diabetic kidney complication: Secondary | ICD-10-CM | POA: Diagnosis not present

## 2018-12-21 DIAGNOSIS — D509 Iron deficiency anemia, unspecified: Secondary | ICD-10-CM | POA: Diagnosis not present

## 2018-12-21 DIAGNOSIS — D631 Anemia in chronic kidney disease: Secondary | ICD-10-CM | POA: Diagnosis not present

## 2018-12-21 DIAGNOSIS — N186 End stage renal disease: Secondary | ICD-10-CM | POA: Diagnosis not present

## 2018-12-21 IMAGING — US US RENAL
1 series · 14 of 25 positions shown · non-contrast
Comparison: 12/21/2013

CLINICAL DATA: Acute kidney injury

EXAM:
RENAL / URINARY TRACT ULTRASOUND COMPLETE

[Series 1: us renal · 0.28mm/px · 14 of 34 slices shown]
[im 1/34]
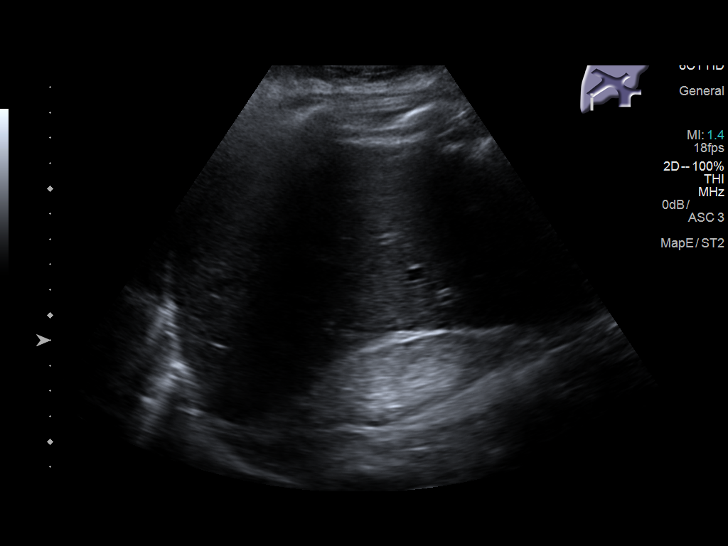
[im 3/34]
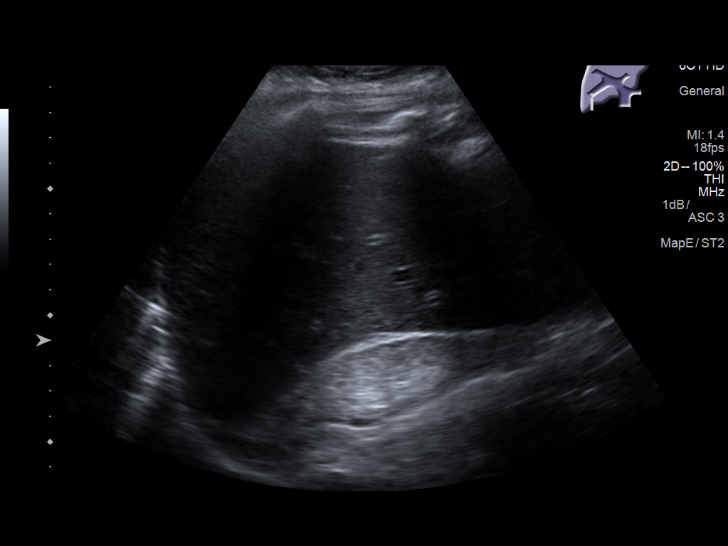
[im 6/34]
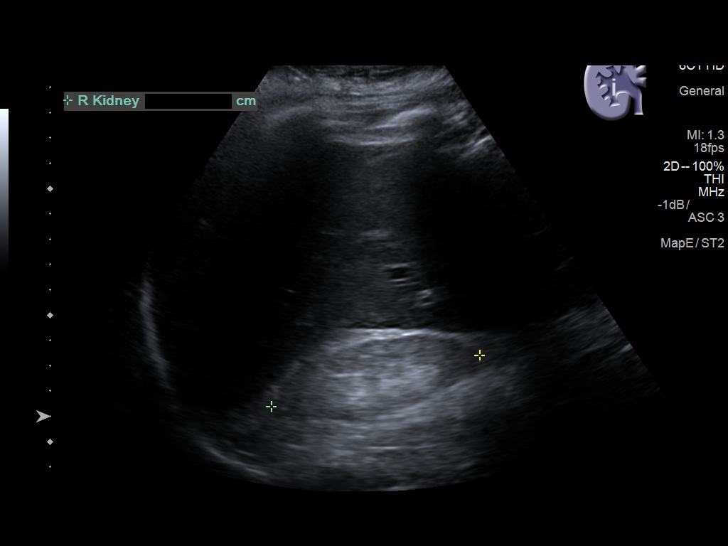
[im 9/34]
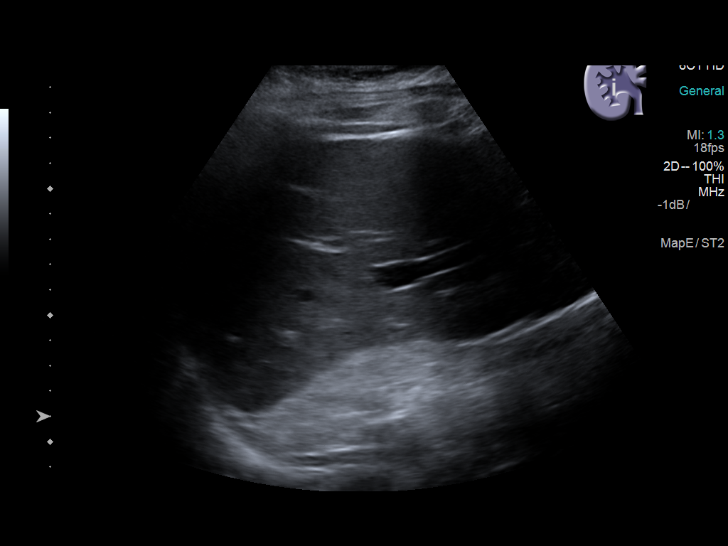
[im 12/34]
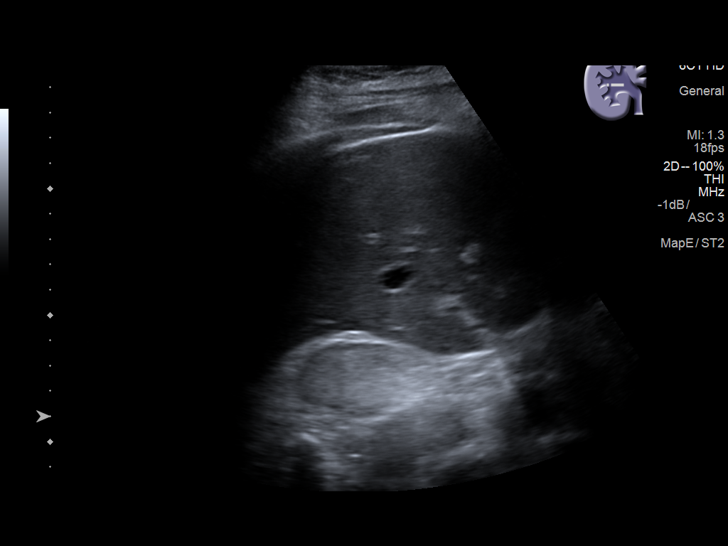
[im 13/34]
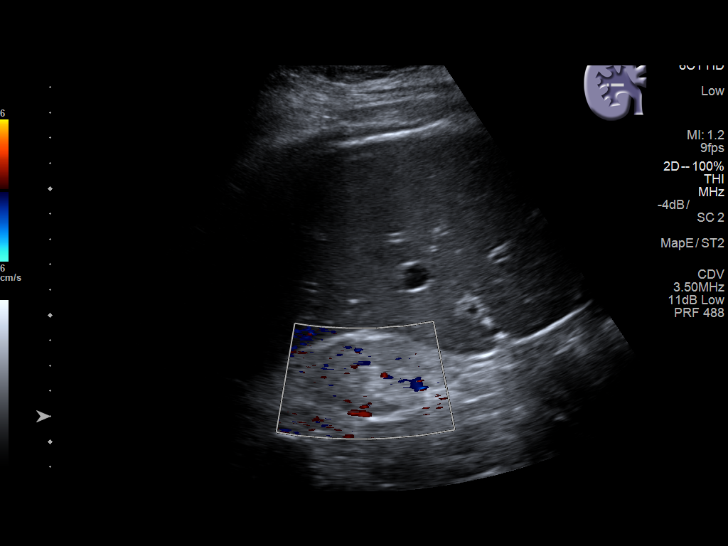
[im 16/34]
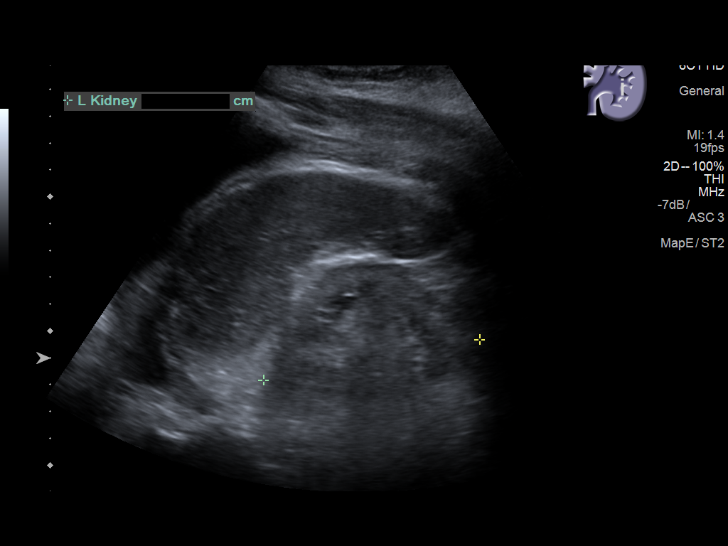
[im 18/34]
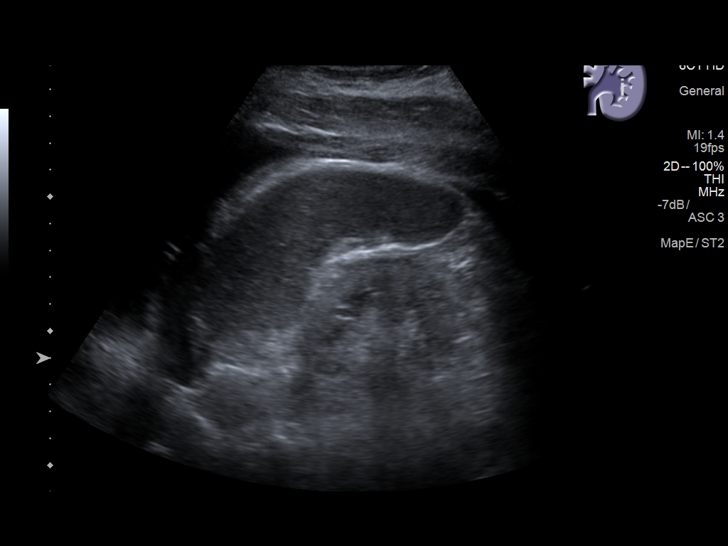
[im 21/34]
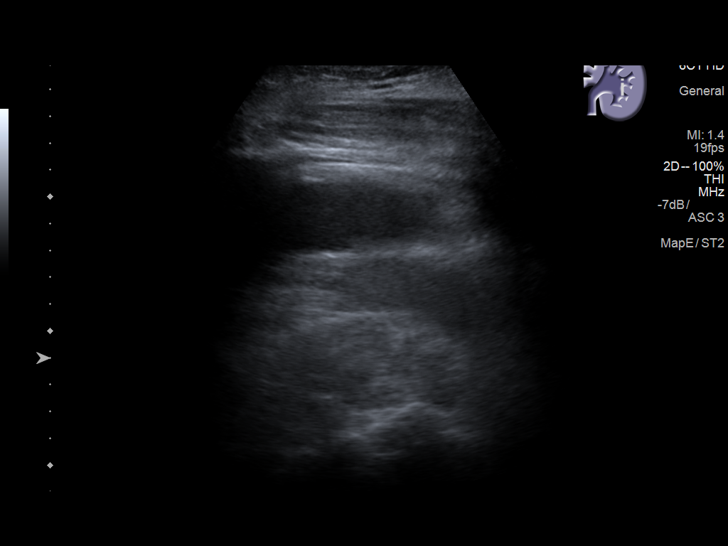
[im 23/34]
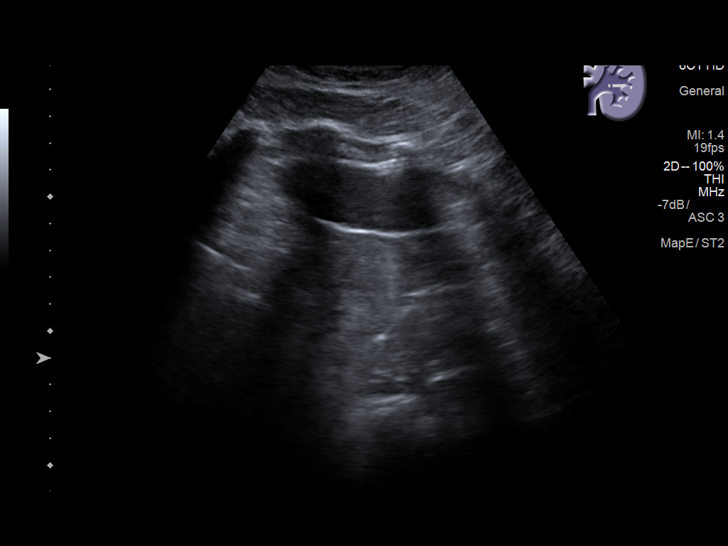
[im 25/34]
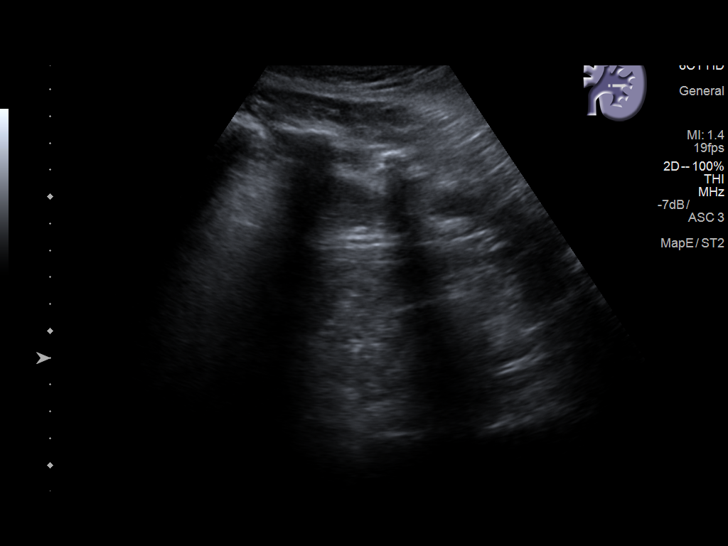
[im 28/34]
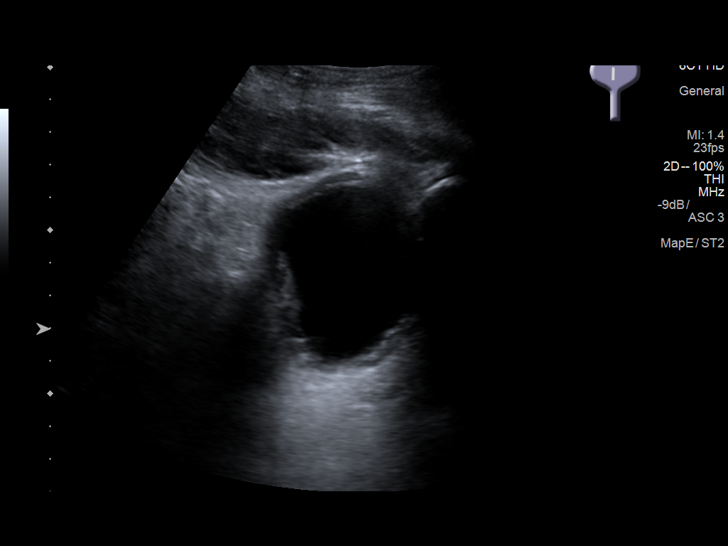
[im 31/34]
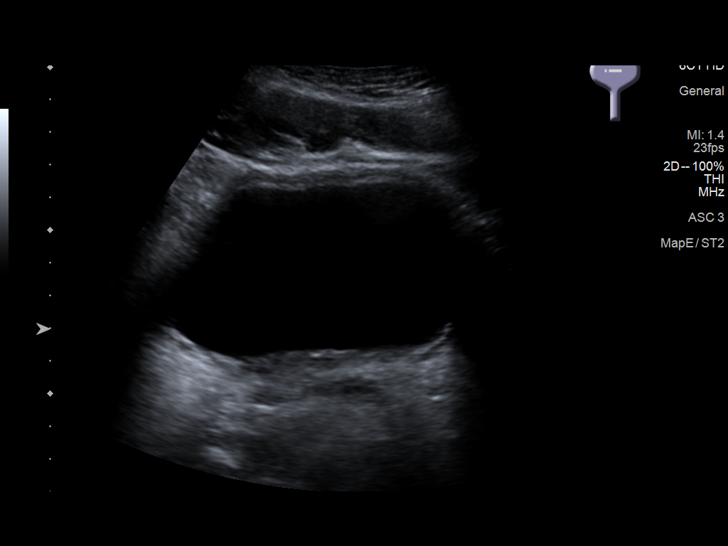
[im 34/34]
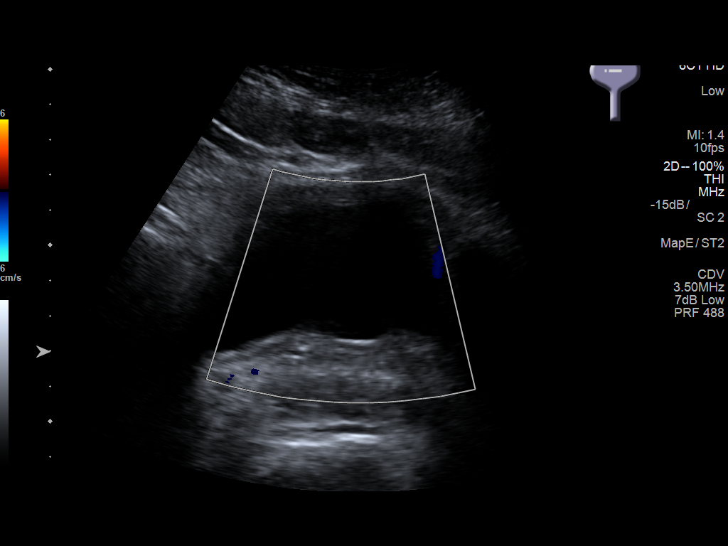

[14 of 25 positions shown; findings below may reference images not displayed]

FINDINGS: Right Kidney:

Length: 8.5 cm. Parenchymal thinning. Marked echogenicity of renal
parenchyma. No hydronephrosis.

Left Kidney:

Length: 8.2 cm. Parenchymal thinning. Marked echogenicity of renal
parenchyma. No hydronephrosis.

Bladder:

Appears normal for degree of bladder distention.
IMPRESSION: Atrophy and markedly echogenic renal parenchyma consistent with
medical renal disease. No hydronephrosis.

## 2018-12-23 DIAGNOSIS — E1129 Type 2 diabetes mellitus with other diabetic kidney complication: Secondary | ICD-10-CM | POA: Diagnosis not present

## 2018-12-23 DIAGNOSIS — E8779 Other fluid overload: Secondary | ICD-10-CM | POA: Diagnosis not present

## 2018-12-23 DIAGNOSIS — N2581 Secondary hyperparathyroidism of renal origin: Secondary | ICD-10-CM | POA: Diagnosis not present

## 2018-12-23 DIAGNOSIS — N186 End stage renal disease: Secondary | ICD-10-CM | POA: Diagnosis not present

## 2018-12-23 DIAGNOSIS — D509 Iron deficiency anemia, unspecified: Secondary | ICD-10-CM | POA: Diagnosis not present

## 2018-12-23 DIAGNOSIS — D631 Anemia in chronic kidney disease: Secondary | ICD-10-CM | POA: Diagnosis not present

## 2018-12-24 MED FILL — AMLODIPINE BESYLATE 5 MG TA: 5 | 30 days supply | Qty: 30 | Fill #1

## 2018-12-25 DIAGNOSIS — D631 Anemia in chronic kidney disease: Secondary | ICD-10-CM | POA: Diagnosis not present

## 2018-12-25 DIAGNOSIS — E1129 Type 2 diabetes mellitus with other diabetic kidney complication: Secondary | ICD-10-CM | POA: Diagnosis not present

## 2018-12-25 DIAGNOSIS — N2581 Secondary hyperparathyroidism of renal origin: Secondary | ICD-10-CM | POA: Diagnosis not present

## 2018-12-25 DIAGNOSIS — N186 End stage renal disease: Secondary | ICD-10-CM | POA: Diagnosis not present

## 2018-12-25 DIAGNOSIS — E8779 Other fluid overload: Secondary | ICD-10-CM | POA: Diagnosis not present

## 2018-12-25 DIAGNOSIS — D509 Iron deficiency anemia, unspecified: Secondary | ICD-10-CM | POA: Diagnosis not present

## 2018-12-28 DIAGNOSIS — D509 Iron deficiency anemia, unspecified: Secondary | ICD-10-CM | POA: Diagnosis not present

## 2018-12-28 DIAGNOSIS — E8779 Other fluid overload: Secondary | ICD-10-CM | POA: Diagnosis not present

## 2018-12-28 DIAGNOSIS — D631 Anemia in chronic kidney disease: Secondary | ICD-10-CM | POA: Diagnosis not present

## 2018-12-28 DIAGNOSIS — N186 End stage renal disease: Secondary | ICD-10-CM | POA: Diagnosis not present

## 2018-12-28 DIAGNOSIS — E1129 Type 2 diabetes mellitus with other diabetic kidney complication: Secondary | ICD-10-CM | POA: Diagnosis not present

## 2018-12-28 DIAGNOSIS — N2581 Secondary hyperparathyroidism of renal origin: Secondary | ICD-10-CM | POA: Diagnosis not present

## 2018-12-30 DIAGNOSIS — E1129 Type 2 diabetes mellitus with other diabetic kidney complication: Secondary | ICD-10-CM | POA: Diagnosis not present

## 2018-12-30 DIAGNOSIS — D631 Anemia in chronic kidney disease: Secondary | ICD-10-CM | POA: Diagnosis not present

## 2018-12-30 DIAGNOSIS — E8779 Other fluid overload: Secondary | ICD-10-CM | POA: Diagnosis not present

## 2018-12-30 DIAGNOSIS — N2581 Secondary hyperparathyroidism of renal origin: Secondary | ICD-10-CM | POA: Diagnosis not present

## 2018-12-30 DIAGNOSIS — N186 End stage renal disease: Secondary | ICD-10-CM | POA: Diagnosis not present

## 2018-12-30 DIAGNOSIS — D509 Iron deficiency anemia, unspecified: Secondary | ICD-10-CM | POA: Diagnosis not present

## 2019-01-01 DIAGNOSIS — N186 End stage renal disease: Secondary | ICD-10-CM | POA: Diagnosis not present

## 2019-01-01 DIAGNOSIS — E1129 Type 2 diabetes mellitus with other diabetic kidney complication: Secondary | ICD-10-CM | POA: Diagnosis not present

## 2019-01-01 DIAGNOSIS — N2581 Secondary hyperparathyroidism of renal origin: Secondary | ICD-10-CM | POA: Diagnosis not present

## 2019-01-01 DIAGNOSIS — D631 Anemia in chronic kidney disease: Secondary | ICD-10-CM | POA: Diagnosis not present

## 2019-01-01 DIAGNOSIS — E8779 Other fluid overload: Secondary | ICD-10-CM | POA: Diagnosis not present

## 2019-01-01 DIAGNOSIS — D509 Iron deficiency anemia, unspecified: Secondary | ICD-10-CM | POA: Diagnosis not present

## 2019-01-04 DIAGNOSIS — E8779 Other fluid overload: Secondary | ICD-10-CM | POA: Diagnosis not present

## 2019-01-04 DIAGNOSIS — E1129 Type 2 diabetes mellitus with other diabetic kidney complication: Secondary | ICD-10-CM | POA: Diagnosis not present

## 2019-01-04 DIAGNOSIS — N186 End stage renal disease: Secondary | ICD-10-CM | POA: Diagnosis not present

## 2019-01-04 DIAGNOSIS — D509 Iron deficiency anemia, unspecified: Secondary | ICD-10-CM | POA: Diagnosis not present

## 2019-01-04 DIAGNOSIS — D631 Anemia in chronic kidney disease: Secondary | ICD-10-CM | POA: Diagnosis not present

## 2019-01-04 DIAGNOSIS — N2581 Secondary hyperparathyroidism of renal origin: Secondary | ICD-10-CM | POA: Diagnosis not present

## 2019-01-06 DIAGNOSIS — N2581 Secondary hyperparathyroidism of renal origin: Secondary | ICD-10-CM | POA: Diagnosis not present

## 2019-01-06 DIAGNOSIS — N186 End stage renal disease: Secondary | ICD-10-CM | POA: Diagnosis not present

## 2019-01-06 DIAGNOSIS — E8779 Other fluid overload: Secondary | ICD-10-CM | POA: Diagnosis not present

## 2019-01-06 DIAGNOSIS — E1129 Type 2 diabetes mellitus with other diabetic kidney complication: Secondary | ICD-10-CM | POA: Diagnosis not present

## 2019-01-06 DIAGNOSIS — D631 Anemia in chronic kidney disease: Secondary | ICD-10-CM | POA: Diagnosis not present

## 2019-01-06 DIAGNOSIS — D509 Iron deficiency anemia, unspecified: Secondary | ICD-10-CM | POA: Diagnosis not present

## 2019-01-08 DIAGNOSIS — D509 Iron deficiency anemia, unspecified: Secondary | ICD-10-CM | POA: Diagnosis not present

## 2019-01-08 DIAGNOSIS — N186 End stage renal disease: Secondary | ICD-10-CM | POA: Diagnosis not present

## 2019-01-08 DIAGNOSIS — E1129 Type 2 diabetes mellitus with other diabetic kidney complication: Secondary | ICD-10-CM | POA: Diagnosis not present

## 2019-01-08 DIAGNOSIS — D631 Anemia in chronic kidney disease: Secondary | ICD-10-CM | POA: Diagnosis not present

## 2019-01-08 DIAGNOSIS — E8779 Other fluid overload: Secondary | ICD-10-CM | POA: Diagnosis not present

## 2019-01-08 DIAGNOSIS — N2581 Secondary hyperparathyroidism of renal origin: Secondary | ICD-10-CM | POA: Diagnosis not present

## 2019-01-13 DIAGNOSIS — N2581 Secondary hyperparathyroidism of renal origin: Secondary | ICD-10-CM | POA: Diagnosis not present

## 2019-01-13 DIAGNOSIS — E1129 Type 2 diabetes mellitus with other diabetic kidney complication: Secondary | ICD-10-CM | POA: Diagnosis not present

## 2019-01-13 DIAGNOSIS — D631 Anemia in chronic kidney disease: Secondary | ICD-10-CM | POA: Diagnosis not present

## 2019-01-13 DIAGNOSIS — D509 Iron deficiency anemia, unspecified: Secondary | ICD-10-CM | POA: Diagnosis not present

## 2019-01-13 DIAGNOSIS — N186 End stage renal disease: Secondary | ICD-10-CM | POA: Diagnosis not present

## 2019-01-13 DIAGNOSIS — E8779 Other fluid overload: Secondary | ICD-10-CM | POA: Diagnosis not present

## 2019-01-13 MED FILL — CARVEDILOL 3.125 MG TABLET: 3.125 | 60 days supply | Qty: 120 | Fill #1

## 2019-01-13 MED FILL — SEVELAMER CARBONATE 800 MG: 800 | 30 days supply | Qty: 480 | Fill #0

## 2019-01-13 MED FILL — AMLODIPINE BESYLATE 5 MG TA: 5 | 90 days supply | Qty: 90 | Fill #2

## 2019-01-15 DIAGNOSIS — N186 End stage renal disease: Secondary | ICD-10-CM | POA: Diagnosis not present

## 2019-01-15 DIAGNOSIS — D509 Iron deficiency anemia, unspecified: Secondary | ICD-10-CM | POA: Diagnosis not present

## 2019-01-15 DIAGNOSIS — E8779 Other fluid overload: Secondary | ICD-10-CM | POA: Diagnosis not present

## 2019-01-15 DIAGNOSIS — D631 Anemia in chronic kidney disease: Secondary | ICD-10-CM | POA: Diagnosis not present

## 2019-01-15 DIAGNOSIS — E1129 Type 2 diabetes mellitus with other diabetic kidney complication: Secondary | ICD-10-CM | POA: Diagnosis not present

## 2019-01-15 DIAGNOSIS — N2581 Secondary hyperparathyroidism of renal origin: Secondary | ICD-10-CM | POA: Diagnosis not present

## 2019-01-18 DIAGNOSIS — E1129 Type 2 diabetes mellitus with other diabetic kidney complication: Secondary | ICD-10-CM | POA: Diagnosis not present

## 2019-01-18 DIAGNOSIS — N186 End stage renal disease: Secondary | ICD-10-CM | POA: Diagnosis not present

## 2019-01-18 DIAGNOSIS — D631 Anemia in chronic kidney disease: Secondary | ICD-10-CM | POA: Diagnosis not present

## 2019-01-18 DIAGNOSIS — D509 Iron deficiency anemia, unspecified: Secondary | ICD-10-CM | POA: Diagnosis not present

## 2019-01-18 DIAGNOSIS — N2581 Secondary hyperparathyroidism of renal origin: Secondary | ICD-10-CM | POA: Diagnosis not present

## 2019-01-18 DIAGNOSIS — E8779 Other fluid overload: Secondary | ICD-10-CM | POA: Diagnosis not present

## 2019-01-20 DIAGNOSIS — D631 Anemia in chronic kidney disease: Secondary | ICD-10-CM | POA: Diagnosis not present

## 2019-01-20 DIAGNOSIS — E1129 Type 2 diabetes mellitus with other diabetic kidney complication: Secondary | ICD-10-CM | POA: Diagnosis not present

## 2019-01-20 DIAGNOSIS — D509 Iron deficiency anemia, unspecified: Secondary | ICD-10-CM | POA: Diagnosis not present

## 2019-01-20 DIAGNOSIS — N2581 Secondary hyperparathyroidism of renal origin: Secondary | ICD-10-CM | POA: Diagnosis not present

## 2019-01-20 DIAGNOSIS — N186 End stage renal disease: Secondary | ICD-10-CM | POA: Diagnosis not present

## 2019-01-20 DIAGNOSIS — E8779 Other fluid overload: Secondary | ICD-10-CM | POA: Diagnosis not present

## 2019-01-22 DIAGNOSIS — D631 Anemia in chronic kidney disease: Secondary | ICD-10-CM | POA: Diagnosis not present

## 2019-01-22 DIAGNOSIS — D509 Iron deficiency anemia, unspecified: Secondary | ICD-10-CM | POA: Diagnosis not present

## 2019-01-22 DIAGNOSIS — N2581 Secondary hyperparathyroidism of renal origin: Secondary | ICD-10-CM | POA: Diagnosis not present

## 2019-01-22 DIAGNOSIS — E8779 Other fluid overload: Secondary | ICD-10-CM | POA: Diagnosis not present

## 2019-01-22 DIAGNOSIS — N186 End stage renal disease: Secondary | ICD-10-CM | POA: Diagnosis not present

## 2019-01-22 DIAGNOSIS — E1129 Type 2 diabetes mellitus with other diabetic kidney complication: Secondary | ICD-10-CM | POA: Diagnosis not present

## 2019-01-27 DIAGNOSIS — D509 Iron deficiency anemia, unspecified: Secondary | ICD-10-CM | POA: Diagnosis not present

## 2019-01-27 DIAGNOSIS — E1129 Type 2 diabetes mellitus with other diabetic kidney complication: Secondary | ICD-10-CM | POA: Diagnosis not present

## 2019-01-27 DIAGNOSIS — N2581 Secondary hyperparathyroidism of renal origin: Secondary | ICD-10-CM | POA: Diagnosis not present

## 2019-01-27 DIAGNOSIS — E8779 Other fluid overload: Secondary | ICD-10-CM | POA: Diagnosis not present

## 2019-01-27 DIAGNOSIS — D631 Anemia in chronic kidney disease: Secondary | ICD-10-CM | POA: Diagnosis not present

## 2019-01-27 DIAGNOSIS — N186 End stage renal disease: Secondary | ICD-10-CM | POA: Diagnosis not present

## 2019-01-29 DIAGNOSIS — D509 Iron deficiency anemia, unspecified: Secondary | ICD-10-CM | POA: Diagnosis not present

## 2019-01-29 DIAGNOSIS — D631 Anemia in chronic kidney disease: Secondary | ICD-10-CM | POA: Diagnosis not present

## 2019-01-29 DIAGNOSIS — E1129 Type 2 diabetes mellitus with other diabetic kidney complication: Secondary | ICD-10-CM | POA: Diagnosis not present

## 2019-01-29 DIAGNOSIS — E8779 Other fluid overload: Secondary | ICD-10-CM | POA: Diagnosis not present

## 2019-01-29 DIAGNOSIS — N186 End stage renal disease: Secondary | ICD-10-CM | POA: Diagnosis not present

## 2019-01-29 DIAGNOSIS — N2581 Secondary hyperparathyroidism of renal origin: Secondary | ICD-10-CM | POA: Diagnosis not present

## 2019-01-31 DIAGNOSIS — Z992 Dependence on renal dialysis: Secondary | ICD-10-CM | POA: Diagnosis not present

## 2019-01-31 DIAGNOSIS — N186 End stage renal disease: Secondary | ICD-10-CM | POA: Diagnosis not present

## 2019-01-31 DIAGNOSIS — I129 Hypertensive chronic kidney disease with stage 1 through stage 4 chronic kidney disease, or unspecified chronic kidney disease: Secondary | ICD-10-CM | POA: Diagnosis not present

## 2019-02-01 DIAGNOSIS — D631 Anemia in chronic kidney disease: Secondary | ICD-10-CM | POA: Diagnosis not present

## 2019-02-01 DIAGNOSIS — E1129 Type 2 diabetes mellitus with other diabetic kidney complication: Secondary | ICD-10-CM | POA: Diagnosis not present

## 2019-02-01 DIAGNOSIS — N186 End stage renal disease: Secondary | ICD-10-CM | POA: Diagnosis not present

## 2019-02-01 DIAGNOSIS — N2581 Secondary hyperparathyroidism of renal origin: Secondary | ICD-10-CM | POA: Diagnosis not present

## 2019-02-01 DIAGNOSIS — D509 Iron deficiency anemia, unspecified: Secondary | ICD-10-CM | POA: Diagnosis not present

## 2019-02-01 DIAGNOSIS — E8779 Other fluid overload: Secondary | ICD-10-CM | POA: Diagnosis not present

## 2019-02-01 DIAGNOSIS — I129 Hypertensive chronic kidney disease with stage 1 through stage 4 chronic kidney disease, or unspecified chronic kidney disease: Secondary | ICD-10-CM | POA: Diagnosis not present

## 2019-02-01 DIAGNOSIS — Z992 Dependence on renal dialysis: Secondary | ICD-10-CM | POA: Diagnosis not present

## 2019-02-03 DIAGNOSIS — E1129 Type 2 diabetes mellitus with other diabetic kidney complication: Secondary | ICD-10-CM | POA: Diagnosis not present

## 2019-02-03 DIAGNOSIS — D509 Iron deficiency anemia, unspecified: Secondary | ICD-10-CM | POA: Diagnosis not present

## 2019-02-03 DIAGNOSIS — E8779 Other fluid overload: Secondary | ICD-10-CM | POA: Diagnosis not present

## 2019-02-03 DIAGNOSIS — D631 Anemia in chronic kidney disease: Secondary | ICD-10-CM | POA: Diagnosis not present

## 2019-02-03 DIAGNOSIS — N2581 Secondary hyperparathyroidism of renal origin: Secondary | ICD-10-CM | POA: Diagnosis not present

## 2019-02-03 DIAGNOSIS — N186 End stage renal disease: Secondary | ICD-10-CM | POA: Diagnosis not present

## 2019-02-05 DIAGNOSIS — N2581 Secondary hyperparathyroidism of renal origin: Secondary | ICD-10-CM | POA: Diagnosis not present

## 2019-02-05 DIAGNOSIS — D631 Anemia in chronic kidney disease: Secondary | ICD-10-CM | POA: Diagnosis not present

## 2019-02-05 DIAGNOSIS — E1129 Type 2 diabetes mellitus with other diabetic kidney complication: Secondary | ICD-10-CM | POA: Diagnosis not present

## 2019-02-05 DIAGNOSIS — E8779 Other fluid overload: Secondary | ICD-10-CM | POA: Diagnosis not present

## 2019-02-05 DIAGNOSIS — N186 End stage renal disease: Secondary | ICD-10-CM | POA: Diagnosis not present

## 2019-02-05 DIAGNOSIS — D509 Iron deficiency anemia, unspecified: Secondary | ICD-10-CM | POA: Diagnosis not present

## 2019-02-08 DIAGNOSIS — E1129 Type 2 diabetes mellitus with other diabetic kidney complication: Secondary | ICD-10-CM | POA: Diagnosis not present

## 2019-02-08 DIAGNOSIS — N186 End stage renal disease: Secondary | ICD-10-CM | POA: Diagnosis not present

## 2019-02-08 DIAGNOSIS — D631 Anemia in chronic kidney disease: Secondary | ICD-10-CM | POA: Diagnosis not present

## 2019-02-08 DIAGNOSIS — N2581 Secondary hyperparathyroidism of renal origin: Secondary | ICD-10-CM | POA: Diagnosis not present

## 2019-02-08 DIAGNOSIS — D509 Iron deficiency anemia, unspecified: Secondary | ICD-10-CM | POA: Diagnosis not present

## 2019-02-08 DIAGNOSIS — E8779 Other fluid overload: Secondary | ICD-10-CM | POA: Diagnosis not present

## 2019-02-10 DIAGNOSIS — D631 Anemia in chronic kidney disease: Secondary | ICD-10-CM | POA: Diagnosis not present

## 2019-02-10 DIAGNOSIS — N2581 Secondary hyperparathyroidism of renal origin: Secondary | ICD-10-CM | POA: Diagnosis not present

## 2019-02-10 DIAGNOSIS — N186 End stage renal disease: Secondary | ICD-10-CM | POA: Diagnosis not present

## 2019-02-10 DIAGNOSIS — E1129 Type 2 diabetes mellitus with other diabetic kidney complication: Secondary | ICD-10-CM | POA: Diagnosis not present

## 2019-02-10 DIAGNOSIS — D509 Iron deficiency anemia, unspecified: Secondary | ICD-10-CM | POA: Diagnosis not present

## 2019-02-10 DIAGNOSIS — E8779 Other fluid overload: Secondary | ICD-10-CM | POA: Diagnosis not present

## 2019-02-12 DIAGNOSIS — N186 End stage renal disease: Secondary | ICD-10-CM | POA: Diagnosis not present

## 2019-02-12 DIAGNOSIS — D631 Anemia in chronic kidney disease: Secondary | ICD-10-CM | POA: Diagnosis not present

## 2019-02-12 DIAGNOSIS — E1129 Type 2 diabetes mellitus with other diabetic kidney complication: Secondary | ICD-10-CM | POA: Diagnosis not present

## 2019-02-12 DIAGNOSIS — E8779 Other fluid overload: Secondary | ICD-10-CM | POA: Diagnosis not present

## 2019-02-12 DIAGNOSIS — N2581 Secondary hyperparathyroidism of renal origin: Secondary | ICD-10-CM | POA: Diagnosis not present

## 2019-02-12 DIAGNOSIS — D509 Iron deficiency anemia, unspecified: Secondary | ICD-10-CM | POA: Diagnosis not present

## 2019-02-15 DIAGNOSIS — N2581 Secondary hyperparathyroidism of renal origin: Secondary | ICD-10-CM | POA: Diagnosis not present

## 2019-02-15 DIAGNOSIS — D509 Iron deficiency anemia, unspecified: Secondary | ICD-10-CM | POA: Diagnosis not present

## 2019-02-15 DIAGNOSIS — D631 Anemia in chronic kidney disease: Secondary | ICD-10-CM | POA: Diagnosis not present

## 2019-02-15 DIAGNOSIS — E8779 Other fluid overload: Secondary | ICD-10-CM | POA: Diagnosis not present

## 2019-02-15 DIAGNOSIS — E1129 Type 2 diabetes mellitus with other diabetic kidney complication: Secondary | ICD-10-CM | POA: Diagnosis not present

## 2019-02-15 DIAGNOSIS — N186 End stage renal disease: Secondary | ICD-10-CM | POA: Diagnosis not present

## 2019-02-17 DIAGNOSIS — D509 Iron deficiency anemia, unspecified: Secondary | ICD-10-CM | POA: Diagnosis not present

## 2019-02-17 DIAGNOSIS — E1129 Type 2 diabetes mellitus with other diabetic kidney complication: Secondary | ICD-10-CM | POA: Diagnosis not present

## 2019-02-17 DIAGNOSIS — N2581 Secondary hyperparathyroidism of renal origin: Secondary | ICD-10-CM | POA: Diagnosis not present

## 2019-02-17 DIAGNOSIS — D631 Anemia in chronic kidney disease: Secondary | ICD-10-CM | POA: Diagnosis not present

## 2019-02-17 DIAGNOSIS — E8779 Other fluid overload: Secondary | ICD-10-CM | POA: Diagnosis not present

## 2019-02-17 DIAGNOSIS — N186 End stage renal disease: Secondary | ICD-10-CM | POA: Diagnosis not present

## 2019-02-19 ENCOUNTER — Other Ambulatory Visit (HOSPITAL_COMMUNITY): Payer: Self-pay | Admitting: Nephrology

## 2019-02-19 DIAGNOSIS — N186 End stage renal disease: Secondary | ICD-10-CM | POA: Diagnosis not present

## 2019-02-19 DIAGNOSIS — E1129 Type 2 diabetes mellitus with other diabetic kidney complication: Secondary | ICD-10-CM | POA: Diagnosis not present

## 2019-02-19 DIAGNOSIS — Z940 Kidney transplant status: Secondary | ICD-10-CM

## 2019-02-19 DIAGNOSIS — D509 Iron deficiency anemia, unspecified: Secondary | ICD-10-CM | POA: Diagnosis not present

## 2019-02-19 DIAGNOSIS — N2581 Secondary hyperparathyroidism of renal origin: Secondary | ICD-10-CM | POA: Diagnosis not present

## 2019-02-19 DIAGNOSIS — E8779 Other fluid overload: Secondary | ICD-10-CM | POA: Diagnosis not present

## 2019-02-19 DIAGNOSIS — D631 Anemia in chronic kidney disease: Secondary | ICD-10-CM | POA: Diagnosis not present

## 2019-02-22 DIAGNOSIS — E8779 Other fluid overload: Secondary | ICD-10-CM | POA: Diagnosis not present

## 2019-02-22 DIAGNOSIS — N2581 Secondary hyperparathyroidism of renal origin: Secondary | ICD-10-CM | POA: Diagnosis not present

## 2019-02-22 DIAGNOSIS — D631 Anemia in chronic kidney disease: Secondary | ICD-10-CM | POA: Diagnosis not present

## 2019-02-22 DIAGNOSIS — D509 Iron deficiency anemia, unspecified: Secondary | ICD-10-CM | POA: Diagnosis not present

## 2019-02-22 DIAGNOSIS — N186 End stage renal disease: Secondary | ICD-10-CM | POA: Diagnosis not present

## 2019-02-22 DIAGNOSIS — E1129 Type 2 diabetes mellitus with other diabetic kidney complication: Secondary | ICD-10-CM | POA: Diagnosis not present

## 2019-02-23 ENCOUNTER — Encounter (HOSPITAL_COMMUNITY): Payer: Medicare Other

## 2019-02-26 DIAGNOSIS — D631 Anemia in chronic kidney disease: Secondary | ICD-10-CM | POA: Diagnosis not present

## 2019-02-26 DIAGNOSIS — N2581 Secondary hyperparathyroidism of renal origin: Secondary | ICD-10-CM | POA: Diagnosis not present

## 2019-02-26 DIAGNOSIS — D509 Iron deficiency anemia, unspecified: Secondary | ICD-10-CM | POA: Diagnosis not present

## 2019-02-26 DIAGNOSIS — E8779 Other fluid overload: Secondary | ICD-10-CM | POA: Diagnosis not present

## 2019-02-26 DIAGNOSIS — E1129 Type 2 diabetes mellitus with other diabetic kidney complication: Secondary | ICD-10-CM | POA: Diagnosis not present

## 2019-02-26 DIAGNOSIS — N186 End stage renal disease: Secondary | ICD-10-CM | POA: Diagnosis not present

## 2019-03-01 DIAGNOSIS — N186 End stage renal disease: Secondary | ICD-10-CM | POA: Diagnosis not present

## 2019-03-01 DIAGNOSIS — E1129 Type 2 diabetes mellitus with other diabetic kidney complication: Secondary | ICD-10-CM | POA: Diagnosis not present

## 2019-03-01 DIAGNOSIS — N2581 Secondary hyperparathyroidism of renal origin: Secondary | ICD-10-CM | POA: Diagnosis not present

## 2019-03-01 DIAGNOSIS — D509 Iron deficiency anemia, unspecified: Secondary | ICD-10-CM | POA: Diagnosis not present

## 2019-03-01 DIAGNOSIS — D631 Anemia in chronic kidney disease: Secondary | ICD-10-CM | POA: Diagnosis not present

## 2019-03-01 DIAGNOSIS — E8779 Other fluid overload: Secondary | ICD-10-CM | POA: Diagnosis not present

## 2019-03-02 ENCOUNTER — Telehealth (HOSPITAL_COMMUNITY): Payer: Self-pay | Admitting: *Deleted

## 2019-03-02 NOTE — Telephone Encounter (Signed)
Patient given detailed  instructions via email per Myocardial Perfusion Study Information Sheet for the test on 03/04/19 at 8:00. Patient notified to arrive 15 minutes early.

## 2019-03-03 DIAGNOSIS — N186 End stage renal disease: Secondary | ICD-10-CM | POA: Diagnosis not present

## 2019-03-03 DIAGNOSIS — Z992 Dependence on renal dialysis: Secondary | ICD-10-CM | POA: Diagnosis not present

## 2019-03-03 DIAGNOSIS — I129 Hypertensive chronic kidney disease with stage 1 through stage 4 chronic kidney disease, or unspecified chronic kidney disease: Secondary | ICD-10-CM | POA: Diagnosis not present

## 2019-03-04 ENCOUNTER — Encounter (HOSPITAL_COMMUNITY): Payer: Medicare Other

## 2019-03-05 DIAGNOSIS — D509 Iron deficiency anemia, unspecified: Secondary | ICD-10-CM | POA: Diagnosis not present

## 2019-03-05 DIAGNOSIS — E1129 Type 2 diabetes mellitus with other diabetic kidney complication: Secondary | ICD-10-CM | POA: Diagnosis not present

## 2019-03-05 DIAGNOSIS — N186 End stage renal disease: Secondary | ICD-10-CM | POA: Diagnosis not present

## 2019-03-05 DIAGNOSIS — E8779 Other fluid overload: Secondary | ICD-10-CM | POA: Diagnosis not present

## 2019-03-05 DIAGNOSIS — N2581 Secondary hyperparathyroidism of renal origin: Secondary | ICD-10-CM | POA: Diagnosis not present

## 2019-03-05 DIAGNOSIS — D631 Anemia in chronic kidney disease: Secondary | ICD-10-CM | POA: Diagnosis not present

## 2019-03-08 DIAGNOSIS — N186 End stage renal disease: Secondary | ICD-10-CM | POA: Diagnosis not present

## 2019-03-08 DIAGNOSIS — E8779 Other fluid overload: Secondary | ICD-10-CM | POA: Diagnosis not present

## 2019-03-08 DIAGNOSIS — D631 Anemia in chronic kidney disease: Secondary | ICD-10-CM | POA: Diagnosis not present

## 2019-03-08 DIAGNOSIS — E1129 Type 2 diabetes mellitus with other diabetic kidney complication: Secondary | ICD-10-CM | POA: Diagnosis not present

## 2019-03-08 DIAGNOSIS — N2581 Secondary hyperparathyroidism of renal origin: Secondary | ICD-10-CM | POA: Diagnosis not present

## 2019-03-08 DIAGNOSIS — D509 Iron deficiency anemia, unspecified: Secondary | ICD-10-CM | POA: Diagnosis not present

## 2019-03-10 DIAGNOSIS — D631 Anemia in chronic kidney disease: Secondary | ICD-10-CM | POA: Diagnosis not present

## 2019-03-10 DIAGNOSIS — E8779 Other fluid overload: Secondary | ICD-10-CM | POA: Diagnosis not present

## 2019-03-10 DIAGNOSIS — E1129 Type 2 diabetes mellitus with other diabetic kidney complication: Secondary | ICD-10-CM | POA: Diagnosis not present

## 2019-03-10 DIAGNOSIS — D509 Iron deficiency anemia, unspecified: Secondary | ICD-10-CM | POA: Diagnosis not present

## 2019-03-10 DIAGNOSIS — N186 End stage renal disease: Secondary | ICD-10-CM | POA: Diagnosis not present

## 2019-03-10 DIAGNOSIS — N2581 Secondary hyperparathyroidism of renal origin: Secondary | ICD-10-CM | POA: Diagnosis not present

## 2019-03-12 DIAGNOSIS — E1129 Type 2 diabetes mellitus with other diabetic kidney complication: Secondary | ICD-10-CM | POA: Diagnosis not present

## 2019-03-12 DIAGNOSIS — D509 Iron deficiency anemia, unspecified: Secondary | ICD-10-CM | POA: Diagnosis not present

## 2019-03-12 DIAGNOSIS — D631 Anemia in chronic kidney disease: Secondary | ICD-10-CM | POA: Diagnosis not present

## 2019-03-12 DIAGNOSIS — E8779 Other fluid overload: Secondary | ICD-10-CM | POA: Diagnosis not present

## 2019-03-12 DIAGNOSIS — N2581 Secondary hyperparathyroidism of renal origin: Secondary | ICD-10-CM | POA: Diagnosis not present

## 2019-03-12 DIAGNOSIS — N186 End stage renal disease: Secondary | ICD-10-CM | POA: Diagnosis not present

## 2019-03-15 DIAGNOSIS — N2581 Secondary hyperparathyroidism of renal origin: Secondary | ICD-10-CM | POA: Diagnosis not present

## 2019-03-15 DIAGNOSIS — E1129 Type 2 diabetes mellitus with other diabetic kidney complication: Secondary | ICD-10-CM | POA: Diagnosis not present

## 2019-03-15 DIAGNOSIS — E8779 Other fluid overload: Secondary | ICD-10-CM | POA: Diagnosis not present

## 2019-03-15 DIAGNOSIS — D509 Iron deficiency anemia, unspecified: Secondary | ICD-10-CM | POA: Diagnosis not present

## 2019-03-15 DIAGNOSIS — N186 End stage renal disease: Secondary | ICD-10-CM | POA: Diagnosis not present

## 2019-03-15 DIAGNOSIS — D631 Anemia in chronic kidney disease: Secondary | ICD-10-CM | POA: Diagnosis not present

## 2019-03-17 ENCOUNTER — Telehealth (HOSPITAL_COMMUNITY): Payer: Self-pay | Admitting: *Deleted

## 2019-03-17 DIAGNOSIS — D631 Anemia in chronic kidney disease: Secondary | ICD-10-CM | POA: Diagnosis not present

## 2019-03-17 DIAGNOSIS — N186 End stage renal disease: Secondary | ICD-10-CM | POA: Diagnosis not present

## 2019-03-17 DIAGNOSIS — E8779 Other fluid overload: Secondary | ICD-10-CM | POA: Diagnosis not present

## 2019-03-17 DIAGNOSIS — N2581 Secondary hyperparathyroidism of renal origin: Secondary | ICD-10-CM | POA: Diagnosis not present

## 2019-03-17 DIAGNOSIS — D509 Iron deficiency anemia, unspecified: Secondary | ICD-10-CM | POA: Diagnosis not present

## 2019-03-17 DIAGNOSIS — E1129 Type 2 diabetes mellitus with other diabetic kidney complication: Secondary | ICD-10-CM | POA: Diagnosis not present

## 2019-03-17 NOTE — Telephone Encounter (Signed)
Patient's wife given detailed instructions per Myocardial Perfusion Study Information Sheet for the test on 02/16/19 at 10:30. Patient notified to arrive 15 minutes early and that it is imperative to arrive on time for appointment to keep from having the test rescheduled.  If you need to cancel or reschedule your appointment, please call the office within 24 hours of your appointment. . Patient's wife verbalized understanding.Veronia Beets

## 2019-03-18 ENCOUNTER — Encounter (HOSPITAL_COMMUNITY): Payer: Medicare Other

## 2019-03-19 DIAGNOSIS — N186 End stage renal disease: Secondary | ICD-10-CM | POA: Diagnosis not present

## 2019-03-19 DIAGNOSIS — D509 Iron deficiency anemia, unspecified: Secondary | ICD-10-CM | POA: Diagnosis not present

## 2019-03-19 DIAGNOSIS — E8779 Other fluid overload: Secondary | ICD-10-CM | POA: Diagnosis not present

## 2019-03-19 DIAGNOSIS — E1129 Type 2 diabetes mellitus with other diabetic kidney complication: Secondary | ICD-10-CM | POA: Diagnosis not present

## 2019-03-19 DIAGNOSIS — N2581 Secondary hyperparathyroidism of renal origin: Secondary | ICD-10-CM | POA: Diagnosis not present

## 2019-03-19 DIAGNOSIS — D631 Anemia in chronic kidney disease: Secondary | ICD-10-CM | POA: Diagnosis not present

## 2019-03-24 DIAGNOSIS — D631 Anemia in chronic kidney disease: Secondary | ICD-10-CM | POA: Diagnosis not present

## 2019-03-24 DIAGNOSIS — E1129 Type 2 diabetes mellitus with other diabetic kidney complication: Secondary | ICD-10-CM | POA: Diagnosis not present

## 2019-03-24 DIAGNOSIS — N2581 Secondary hyperparathyroidism of renal origin: Secondary | ICD-10-CM | POA: Diagnosis not present

## 2019-03-24 DIAGNOSIS — D509 Iron deficiency anemia, unspecified: Secondary | ICD-10-CM | POA: Diagnosis not present

## 2019-03-24 DIAGNOSIS — E8779 Other fluid overload: Secondary | ICD-10-CM | POA: Diagnosis not present

## 2019-03-24 DIAGNOSIS — N186 End stage renal disease: Secondary | ICD-10-CM | POA: Diagnosis not present

## 2019-03-26 DIAGNOSIS — N2581 Secondary hyperparathyroidism of renal origin: Secondary | ICD-10-CM | POA: Diagnosis not present

## 2019-03-26 DIAGNOSIS — D509 Iron deficiency anemia, unspecified: Secondary | ICD-10-CM | POA: Diagnosis not present

## 2019-03-26 DIAGNOSIS — E8779 Other fluid overload: Secondary | ICD-10-CM | POA: Diagnosis not present

## 2019-03-26 DIAGNOSIS — D631 Anemia in chronic kidney disease: Secondary | ICD-10-CM | POA: Diagnosis not present

## 2019-03-26 DIAGNOSIS — N186 End stage renal disease: Secondary | ICD-10-CM | POA: Diagnosis not present

## 2019-03-26 DIAGNOSIS — E1129 Type 2 diabetes mellitus with other diabetic kidney complication: Secondary | ICD-10-CM | POA: Diagnosis not present

## 2019-03-31 DIAGNOSIS — N2581 Secondary hyperparathyroidism of renal origin: Secondary | ICD-10-CM | POA: Diagnosis not present

## 2019-03-31 DIAGNOSIS — D509 Iron deficiency anemia, unspecified: Secondary | ICD-10-CM | POA: Diagnosis not present

## 2019-03-31 DIAGNOSIS — E1129 Type 2 diabetes mellitus with other diabetic kidney complication: Secondary | ICD-10-CM | POA: Diagnosis not present

## 2019-03-31 DIAGNOSIS — N186 End stage renal disease: Secondary | ICD-10-CM | POA: Diagnosis not present

## 2019-03-31 DIAGNOSIS — D631 Anemia in chronic kidney disease: Secondary | ICD-10-CM | POA: Diagnosis not present

## 2019-03-31 DIAGNOSIS — E8779 Other fluid overload: Secondary | ICD-10-CM | POA: Diagnosis not present

## 2019-04-02 DIAGNOSIS — E1129 Type 2 diabetes mellitus with other diabetic kidney complication: Secondary | ICD-10-CM | POA: Diagnosis not present

## 2019-04-02 DIAGNOSIS — N186 End stage renal disease: Secondary | ICD-10-CM | POA: Diagnosis not present

## 2019-04-02 DIAGNOSIS — D509 Iron deficiency anemia, unspecified: Secondary | ICD-10-CM | POA: Diagnosis not present

## 2019-04-02 DIAGNOSIS — D631 Anemia in chronic kidney disease: Secondary | ICD-10-CM | POA: Diagnosis not present

## 2019-04-02 DIAGNOSIS — E8779 Other fluid overload: Secondary | ICD-10-CM | POA: Diagnosis not present

## 2019-04-02 DIAGNOSIS — N2581 Secondary hyperparathyroidism of renal origin: Secondary | ICD-10-CM | POA: Diagnosis not present

## 2019-04-02 MED FILL — AMLODIPINE BESYLATE 5 MG TA: 5 | 90 days supply | Qty: 90 | Fill #0

## 2019-04-02 MED FILL — CARVEDILOL 3.125 MG TABLET: 3.125 | 90 days supply | Qty: 180 | Fill #0

## 2019-04-03 DIAGNOSIS — N186 End stage renal disease: Secondary | ICD-10-CM | POA: Diagnosis not present

## 2019-04-03 DIAGNOSIS — Z992 Dependence on renal dialysis: Secondary | ICD-10-CM | POA: Diagnosis not present

## 2019-04-03 DIAGNOSIS — I129 Hypertensive chronic kidney disease with stage 1 through stage 4 chronic kidney disease, or unspecified chronic kidney disease: Secondary | ICD-10-CM | POA: Diagnosis not present

## 2019-04-05 DIAGNOSIS — D509 Iron deficiency anemia, unspecified: Secondary | ICD-10-CM | POA: Diagnosis not present

## 2019-04-05 DIAGNOSIS — N2581 Secondary hyperparathyroidism of renal origin: Secondary | ICD-10-CM | POA: Diagnosis not present

## 2019-04-05 DIAGNOSIS — Z992 Dependence on renal dialysis: Secondary | ICD-10-CM | POA: Diagnosis not present

## 2019-04-05 DIAGNOSIS — N186 End stage renal disease: Secondary | ICD-10-CM | POA: Diagnosis not present

## 2019-04-05 DIAGNOSIS — D631 Anemia in chronic kidney disease: Secondary | ICD-10-CM | POA: Diagnosis not present

## 2019-04-05 DIAGNOSIS — E8779 Other fluid overload: Secondary | ICD-10-CM | POA: Diagnosis not present

## 2019-04-05 DIAGNOSIS — E1129 Type 2 diabetes mellitus with other diabetic kidney complication: Secondary | ICD-10-CM | POA: Diagnosis not present

## 2019-04-06 ENCOUNTER — Telehealth (HOSPITAL_COMMUNITY): Payer: Self-pay | Admitting: *Deleted

## 2019-04-06 NOTE — Telephone Encounter (Signed)
Attempted to leave a message on voicemail but not set up and other # busy signal x 2in reference to upcoming appointment scheduled for 04/08/19. Phone number given for a call back so details instructions can be given. Shereena Berquist, Ranae Palms

## 2019-04-07 DIAGNOSIS — D509 Iron deficiency anemia, unspecified: Secondary | ICD-10-CM | POA: Diagnosis not present

## 2019-04-07 DIAGNOSIS — Z992 Dependence on renal dialysis: Secondary | ICD-10-CM | POA: Diagnosis not present

## 2019-04-07 DIAGNOSIS — D631 Anemia in chronic kidney disease: Secondary | ICD-10-CM | POA: Diagnosis not present

## 2019-04-07 DIAGNOSIS — E1129 Type 2 diabetes mellitus with other diabetic kidney complication: Secondary | ICD-10-CM | POA: Diagnosis not present

## 2019-04-07 DIAGNOSIS — N186 End stage renal disease: Secondary | ICD-10-CM | POA: Diagnosis not present

## 2019-04-07 DIAGNOSIS — N2581 Secondary hyperparathyroidism of renal origin: Secondary | ICD-10-CM | POA: Diagnosis not present

## 2019-04-08 ENCOUNTER — Other Ambulatory Visit: Payer: Self-pay

## 2019-04-08 ENCOUNTER — Ambulatory Visit (HOSPITAL_COMMUNITY): Payer: Medicare Other | Attending: Nephrology

## 2019-04-08 ENCOUNTER — Encounter (HOSPITAL_COMMUNITY): Payer: Self-pay

## 2019-04-08 VITALS — Ht 73.0 in | Wt 240.0 lb

## 2019-04-08 DIAGNOSIS — I10 Essential (primary) hypertension: Secondary | ICD-10-CM | POA: Diagnosis not present

## 2019-04-08 DIAGNOSIS — Z01810 Encounter for preprocedural cardiovascular examination: Secondary | ICD-10-CM | POA: Diagnosis not present

## 2019-04-08 DIAGNOSIS — Z01818 Encounter for other preprocedural examination: Secondary | ICD-10-CM

## 2019-04-08 DIAGNOSIS — Z940 Kidney transplant status: Secondary | ICD-10-CM

## 2019-04-08 DIAGNOSIS — R0600 Dyspnea, unspecified: Secondary | ICD-10-CM | POA: Insufficient documentation

## 2019-04-08 DIAGNOSIS — E119 Type 2 diabetes mellitus without complications: Secondary | ICD-10-CM | POA: Diagnosis not present

## 2019-04-08 DIAGNOSIS — R110 Nausea: Secondary | ICD-10-CM | POA: Diagnosis not present

## 2019-04-08 LAB — MYOCARDIAL PERFUSION IMAGING
LV dias vol: 141 mL (ref 62–150)
LV sys vol: 71 mL
Peak HR: 90 {beats}/min
Rest HR: 90 {beats}/min
SDS: 1
SRS: 0
SSS: 1
TID: 0.95

## 2019-04-08 MED ORDER — TECHNETIUM TC 99M TETROFOSMIN IV KIT
10.9000 | PACK | Freq: Once | INTRAVENOUS | Status: AC | PRN
  Administered 2019-04-08: 08:00:00 10.9 via INTRAVENOUS
  Filled 2019-04-08: qty 11

## 2019-04-08 MED ORDER — AMINOPHYLLINE 25 MG/ML IV SOLN
75.0000 mg | Freq: Once | INTRAVENOUS | Status: AC
  Administered 2019-04-08: 75 mg via INTRAVENOUS

## 2019-04-08 MED ORDER — REGADENOSON 0.4 MG/5ML IV SOLN
0.4000 mg | Freq: Once | INTRAVENOUS | Status: AC
  Administered 2019-04-08: 0.4 mg via INTRAVENOUS

## 2019-04-08 MED ORDER — TECHNETIUM TC 99M TETROFOSMIN IV KIT
31.7000 | PACK | Freq: Once | INTRAVENOUS | Status: AC | PRN
  Administered 2019-04-08: 31.7 via INTRAVENOUS
  Filled 2019-04-08: qty 32

## 2019-04-09 DIAGNOSIS — N186 End stage renal disease: Secondary | ICD-10-CM | POA: Diagnosis not present

## 2019-04-09 DIAGNOSIS — D509 Iron deficiency anemia, unspecified: Secondary | ICD-10-CM | POA: Diagnosis not present

## 2019-04-09 DIAGNOSIS — Z992 Dependence on renal dialysis: Secondary | ICD-10-CM | POA: Diagnosis not present

## 2019-04-09 DIAGNOSIS — D631 Anemia in chronic kidney disease: Secondary | ICD-10-CM | POA: Diagnosis not present

## 2019-04-09 DIAGNOSIS — E1129 Type 2 diabetes mellitus with other diabetic kidney complication: Secondary | ICD-10-CM | POA: Diagnosis not present

## 2019-04-09 DIAGNOSIS — N2581 Secondary hyperparathyroidism of renal origin: Secondary | ICD-10-CM | POA: Diagnosis not present

## 2019-04-12 DIAGNOSIS — D631 Anemia in chronic kidney disease: Secondary | ICD-10-CM | POA: Diagnosis not present

## 2019-04-12 DIAGNOSIS — D509 Iron deficiency anemia, unspecified: Secondary | ICD-10-CM | POA: Diagnosis not present

## 2019-04-12 DIAGNOSIS — N186 End stage renal disease: Secondary | ICD-10-CM | POA: Diagnosis not present

## 2019-04-12 DIAGNOSIS — Z992 Dependence on renal dialysis: Secondary | ICD-10-CM | POA: Diagnosis not present

## 2019-04-12 DIAGNOSIS — E1129 Type 2 diabetes mellitus with other diabetic kidney complication: Secondary | ICD-10-CM | POA: Diagnosis not present

## 2019-04-12 DIAGNOSIS — N2581 Secondary hyperparathyroidism of renal origin: Secondary | ICD-10-CM | POA: Diagnosis not present

## 2019-04-16 DIAGNOSIS — N2581 Secondary hyperparathyroidism of renal origin: Secondary | ICD-10-CM | POA: Diagnosis not present

## 2019-04-16 DIAGNOSIS — D631 Anemia in chronic kidney disease: Secondary | ICD-10-CM | POA: Diagnosis not present

## 2019-04-16 DIAGNOSIS — D509 Iron deficiency anemia, unspecified: Secondary | ICD-10-CM | POA: Diagnosis not present

## 2019-04-16 DIAGNOSIS — N186 End stage renal disease: Secondary | ICD-10-CM | POA: Diagnosis not present

## 2019-04-16 DIAGNOSIS — E1129 Type 2 diabetes mellitus with other diabetic kidney complication: Secondary | ICD-10-CM | POA: Diagnosis not present

## 2019-04-16 DIAGNOSIS — Z992 Dependence on renal dialysis: Secondary | ICD-10-CM | POA: Diagnosis not present

## 2019-04-19 DIAGNOSIS — N2581 Secondary hyperparathyroidism of renal origin: Secondary | ICD-10-CM | POA: Diagnosis not present

## 2019-04-19 DIAGNOSIS — D631 Anemia in chronic kidney disease: Secondary | ICD-10-CM | POA: Diagnosis not present

## 2019-04-19 DIAGNOSIS — Z992 Dependence on renal dialysis: Secondary | ICD-10-CM | POA: Diagnosis not present

## 2019-04-19 DIAGNOSIS — D509 Iron deficiency anemia, unspecified: Secondary | ICD-10-CM | POA: Diagnosis not present

## 2019-04-19 DIAGNOSIS — N186 End stage renal disease: Secondary | ICD-10-CM | POA: Diagnosis not present

## 2019-04-19 DIAGNOSIS — E1129 Type 2 diabetes mellitus with other diabetic kidney complication: Secondary | ICD-10-CM | POA: Diagnosis not present

## 2019-04-21 DIAGNOSIS — N186 End stage renal disease: Secondary | ICD-10-CM | POA: Diagnosis not present

## 2019-04-21 DIAGNOSIS — D509 Iron deficiency anemia, unspecified: Secondary | ICD-10-CM | POA: Diagnosis not present

## 2019-04-21 DIAGNOSIS — Z992 Dependence on renal dialysis: Secondary | ICD-10-CM | POA: Diagnosis not present

## 2019-04-21 DIAGNOSIS — D631 Anemia in chronic kidney disease: Secondary | ICD-10-CM | POA: Diagnosis not present

## 2019-04-21 DIAGNOSIS — E1129 Type 2 diabetes mellitus with other diabetic kidney complication: Secondary | ICD-10-CM | POA: Diagnosis not present

## 2019-04-21 DIAGNOSIS — N2581 Secondary hyperparathyroidism of renal origin: Secondary | ICD-10-CM | POA: Diagnosis not present

## 2019-04-23 DIAGNOSIS — Z992 Dependence on renal dialysis: Secondary | ICD-10-CM | POA: Diagnosis not present

## 2019-04-23 DIAGNOSIS — E1129 Type 2 diabetes mellitus with other diabetic kidney complication: Secondary | ICD-10-CM | POA: Diagnosis not present

## 2019-04-23 DIAGNOSIS — N186 End stage renal disease: Secondary | ICD-10-CM | POA: Diagnosis not present

## 2019-04-23 DIAGNOSIS — D631 Anemia in chronic kidney disease: Secondary | ICD-10-CM | POA: Diagnosis not present

## 2019-04-23 DIAGNOSIS — D509 Iron deficiency anemia, unspecified: Secondary | ICD-10-CM | POA: Diagnosis not present

## 2019-04-23 DIAGNOSIS — N2581 Secondary hyperparathyroidism of renal origin: Secondary | ICD-10-CM | POA: Diagnosis not present

## 2019-04-28 DIAGNOSIS — N186 End stage renal disease: Secondary | ICD-10-CM | POA: Diagnosis not present

## 2019-04-28 DIAGNOSIS — N2581 Secondary hyperparathyroidism of renal origin: Secondary | ICD-10-CM | POA: Diagnosis not present

## 2019-04-28 DIAGNOSIS — D631 Anemia in chronic kidney disease: Secondary | ICD-10-CM | POA: Diagnosis not present

## 2019-04-28 DIAGNOSIS — D509 Iron deficiency anemia, unspecified: Secondary | ICD-10-CM | POA: Diagnosis not present

## 2019-04-28 DIAGNOSIS — E1129 Type 2 diabetes mellitus with other diabetic kidney complication: Secondary | ICD-10-CM | POA: Diagnosis not present

## 2019-04-28 DIAGNOSIS — Z992 Dependence on renal dialysis: Secondary | ICD-10-CM | POA: Diagnosis not present

## 2019-04-30 DIAGNOSIS — E1129 Type 2 diabetes mellitus with other diabetic kidney complication: Secondary | ICD-10-CM | POA: Diagnosis not present

## 2019-04-30 DIAGNOSIS — D631 Anemia in chronic kidney disease: Secondary | ICD-10-CM | POA: Diagnosis not present

## 2019-04-30 DIAGNOSIS — D509 Iron deficiency anemia, unspecified: Secondary | ICD-10-CM | POA: Diagnosis not present

## 2019-04-30 DIAGNOSIS — Z992 Dependence on renal dialysis: Secondary | ICD-10-CM | POA: Diagnosis not present

## 2019-04-30 DIAGNOSIS — N2581 Secondary hyperparathyroidism of renal origin: Secondary | ICD-10-CM | POA: Diagnosis not present

## 2019-04-30 DIAGNOSIS — N186 End stage renal disease: Secondary | ICD-10-CM | POA: Diagnosis not present

## 2019-05-03 DIAGNOSIS — E1129 Type 2 diabetes mellitus with other diabetic kidney complication: Secondary | ICD-10-CM | POA: Diagnosis not present

## 2019-05-03 DIAGNOSIS — N2581 Secondary hyperparathyroidism of renal origin: Secondary | ICD-10-CM | POA: Diagnosis not present

## 2019-05-03 DIAGNOSIS — Z992 Dependence on renal dialysis: Secondary | ICD-10-CM | POA: Diagnosis not present

## 2019-05-03 DIAGNOSIS — N186 End stage renal disease: Secondary | ICD-10-CM | POA: Diagnosis not present

## 2019-05-03 DIAGNOSIS — D631 Anemia in chronic kidney disease: Secondary | ICD-10-CM | POA: Diagnosis not present

## 2019-05-03 DIAGNOSIS — D509 Iron deficiency anemia, unspecified: Secondary | ICD-10-CM | POA: Diagnosis not present

## 2019-05-04 DIAGNOSIS — I129 Hypertensive chronic kidney disease with stage 1 through stage 4 chronic kidney disease, or unspecified chronic kidney disease: Secondary | ICD-10-CM | POA: Diagnosis not present

## 2019-05-04 DIAGNOSIS — Z992 Dependence on renal dialysis: Secondary | ICD-10-CM | POA: Diagnosis not present

## 2019-05-04 DIAGNOSIS — N186 End stage renal disease: Secondary | ICD-10-CM | POA: Diagnosis not present

## 2019-05-05 DIAGNOSIS — N2581 Secondary hyperparathyroidism of renal origin: Secondary | ICD-10-CM | POA: Diagnosis not present

## 2019-05-05 DIAGNOSIS — E8779 Other fluid overload: Secondary | ICD-10-CM | POA: Diagnosis not present

## 2019-05-05 DIAGNOSIS — Z992 Dependence on renal dialysis: Secondary | ICD-10-CM | POA: Diagnosis not present

## 2019-05-05 DIAGNOSIS — D631 Anemia in chronic kidney disease: Secondary | ICD-10-CM | POA: Diagnosis not present

## 2019-05-05 DIAGNOSIS — N186 End stage renal disease: Secondary | ICD-10-CM | POA: Diagnosis not present

## 2019-05-05 DIAGNOSIS — D509 Iron deficiency anemia, unspecified: Secondary | ICD-10-CM | POA: Diagnosis not present

## 2019-05-05 DIAGNOSIS — E1129 Type 2 diabetes mellitus with other diabetic kidney complication: Secondary | ICD-10-CM | POA: Diagnosis not present

## 2019-05-07 DIAGNOSIS — E1129 Type 2 diabetes mellitus with other diabetic kidney complication: Secondary | ICD-10-CM | POA: Diagnosis not present

## 2019-05-07 DIAGNOSIS — Z992 Dependence on renal dialysis: Secondary | ICD-10-CM | POA: Diagnosis not present

## 2019-05-07 DIAGNOSIS — N2581 Secondary hyperparathyroidism of renal origin: Secondary | ICD-10-CM | POA: Diagnosis not present

## 2019-05-07 DIAGNOSIS — D509 Iron deficiency anemia, unspecified: Secondary | ICD-10-CM | POA: Diagnosis not present

## 2019-05-07 DIAGNOSIS — N186 End stage renal disease: Secondary | ICD-10-CM | POA: Diagnosis not present

## 2019-05-07 DIAGNOSIS — D631 Anemia in chronic kidney disease: Secondary | ICD-10-CM | POA: Diagnosis not present

## 2019-05-10 DIAGNOSIS — Z992 Dependence on renal dialysis: Secondary | ICD-10-CM | POA: Diagnosis not present

## 2019-05-10 DIAGNOSIS — D509 Iron deficiency anemia, unspecified: Secondary | ICD-10-CM | POA: Diagnosis not present

## 2019-05-10 DIAGNOSIS — E1129 Type 2 diabetes mellitus with other diabetic kidney complication: Secondary | ICD-10-CM | POA: Diagnosis not present

## 2019-05-10 DIAGNOSIS — N186 End stage renal disease: Secondary | ICD-10-CM | POA: Diagnosis not present

## 2019-05-10 DIAGNOSIS — N2581 Secondary hyperparathyroidism of renal origin: Secondary | ICD-10-CM | POA: Diagnosis not present

## 2019-05-10 DIAGNOSIS — D631 Anemia in chronic kidney disease: Secondary | ICD-10-CM | POA: Diagnosis not present

## 2019-05-12 DIAGNOSIS — N2581 Secondary hyperparathyroidism of renal origin: Secondary | ICD-10-CM | POA: Diagnosis not present

## 2019-05-12 DIAGNOSIS — E1129 Type 2 diabetes mellitus with other diabetic kidney complication: Secondary | ICD-10-CM | POA: Diagnosis not present

## 2019-05-12 DIAGNOSIS — D631 Anemia in chronic kidney disease: Secondary | ICD-10-CM | POA: Diagnosis not present

## 2019-05-12 DIAGNOSIS — N186 End stage renal disease: Secondary | ICD-10-CM | POA: Diagnosis not present

## 2019-05-12 DIAGNOSIS — D509 Iron deficiency anemia, unspecified: Secondary | ICD-10-CM | POA: Diagnosis not present

## 2019-05-12 DIAGNOSIS — Z992 Dependence on renal dialysis: Secondary | ICD-10-CM | POA: Diagnosis not present

## 2019-05-17 DIAGNOSIS — D631 Anemia in chronic kidney disease: Secondary | ICD-10-CM | POA: Diagnosis not present

## 2019-05-17 DIAGNOSIS — Z992 Dependence on renal dialysis: Secondary | ICD-10-CM | POA: Diagnosis not present

## 2019-05-17 DIAGNOSIS — D509 Iron deficiency anemia, unspecified: Secondary | ICD-10-CM | POA: Diagnosis not present

## 2019-05-17 DIAGNOSIS — N2581 Secondary hyperparathyroidism of renal origin: Secondary | ICD-10-CM | POA: Diagnosis not present

## 2019-05-17 DIAGNOSIS — N186 End stage renal disease: Secondary | ICD-10-CM | POA: Diagnosis not present

## 2019-05-17 DIAGNOSIS — E1129 Type 2 diabetes mellitus with other diabetic kidney complication: Secondary | ICD-10-CM | POA: Diagnosis not present

## 2019-05-19 DIAGNOSIS — D631 Anemia in chronic kidney disease: Secondary | ICD-10-CM | POA: Diagnosis not present

## 2019-05-19 DIAGNOSIS — E1129 Type 2 diabetes mellitus with other diabetic kidney complication: Secondary | ICD-10-CM | POA: Diagnosis not present

## 2019-05-19 DIAGNOSIS — Z992 Dependence on renal dialysis: Secondary | ICD-10-CM | POA: Diagnosis not present

## 2019-05-19 DIAGNOSIS — N186 End stage renal disease: Secondary | ICD-10-CM | POA: Diagnosis not present

## 2019-05-19 DIAGNOSIS — D509 Iron deficiency anemia, unspecified: Secondary | ICD-10-CM | POA: Diagnosis not present

## 2019-05-19 DIAGNOSIS — N2581 Secondary hyperparathyroidism of renal origin: Secondary | ICD-10-CM | POA: Diagnosis not present

## 2019-05-21 DIAGNOSIS — D509 Iron deficiency anemia, unspecified: Secondary | ICD-10-CM | POA: Diagnosis not present

## 2019-05-21 DIAGNOSIS — D631 Anemia in chronic kidney disease: Secondary | ICD-10-CM | POA: Diagnosis not present

## 2019-05-21 DIAGNOSIS — E1129 Type 2 diabetes mellitus with other diabetic kidney complication: Secondary | ICD-10-CM | POA: Diagnosis not present

## 2019-05-21 DIAGNOSIS — Z992 Dependence on renal dialysis: Secondary | ICD-10-CM | POA: Diagnosis not present

## 2019-05-21 DIAGNOSIS — N186 End stage renal disease: Secondary | ICD-10-CM | POA: Diagnosis not present

## 2019-05-21 DIAGNOSIS — N2581 Secondary hyperparathyroidism of renal origin: Secondary | ICD-10-CM | POA: Diagnosis not present

## 2019-05-24 DIAGNOSIS — N2581 Secondary hyperparathyroidism of renal origin: Secondary | ICD-10-CM | POA: Diagnosis not present

## 2019-05-24 DIAGNOSIS — Z992 Dependence on renal dialysis: Secondary | ICD-10-CM | POA: Diagnosis not present

## 2019-05-24 DIAGNOSIS — D631 Anemia in chronic kidney disease: Secondary | ICD-10-CM | POA: Diagnosis not present

## 2019-05-24 DIAGNOSIS — E1129 Type 2 diabetes mellitus with other diabetic kidney complication: Secondary | ICD-10-CM | POA: Diagnosis not present

## 2019-05-24 DIAGNOSIS — D509 Iron deficiency anemia, unspecified: Secondary | ICD-10-CM | POA: Diagnosis not present

## 2019-05-24 DIAGNOSIS — N186 End stage renal disease: Secondary | ICD-10-CM | POA: Diagnosis not present

## 2019-05-26 ENCOUNTER — Emergency Department (HOSPITAL_COMMUNITY)
Admission: EM | Admit: 2019-05-26 | Discharge: 2019-05-26 | Discharge status: Against Medical Advice | Payer: Medicare Other | Attending: Emergency Medicine | Admitting: Emergency Medicine

## 2019-05-26 ENCOUNTER — Other Ambulatory Visit: Payer: Self-pay

## 2019-05-26 ENCOUNTER — Encounter (HOSPITAL_COMMUNITY): Payer: Self-pay

## 2019-05-26 DIAGNOSIS — N186 End stage renal disease: Secondary | ICD-10-CM | POA: Diagnosis not present

## 2019-05-26 DIAGNOSIS — R2232 Localized swelling, mass and lump, left upper limb: Secondary | ICD-10-CM | POA: Diagnosis present

## 2019-05-26 DIAGNOSIS — R609 Edema, unspecified: Secondary | ICD-10-CM | POA: Diagnosis not present

## 2019-05-26 DIAGNOSIS — I10 Essential (primary) hypertension: Secondary | ICD-10-CM | POA: Diagnosis not present

## 2019-05-26 DIAGNOSIS — Z5321 Procedure and treatment not carried out due to patient leaving prior to being seen by health care provider: Secondary | ICD-10-CM | POA: Diagnosis not present

## 2019-05-26 DIAGNOSIS — D631 Anemia in chronic kidney disease: Secondary | ICD-10-CM | POA: Diagnosis not present

## 2019-05-26 DIAGNOSIS — Z992 Dependence on renal dialysis: Secondary | ICD-10-CM | POA: Diagnosis not present

## 2019-05-26 DIAGNOSIS — N2581 Secondary hyperparathyroidism of renal origin: Secondary | ICD-10-CM | POA: Diagnosis not present

## 2019-05-26 DIAGNOSIS — D509 Iron deficiency anemia, unspecified: Secondary | ICD-10-CM | POA: Diagnosis not present

## 2019-05-26 DIAGNOSIS — E1129 Type 2 diabetes mellitus with other diabetic kidney complication: Secondary | ICD-10-CM | POA: Diagnosis not present

## 2019-05-26 NOTE — ED Triage Notes (Signed)
Patient arrived by [facility] to have left arm dialysis graft checked for awelling. Had full dialysis treatment today. Reports has appt with vascular on 10/1 but dialysis wants evaluated sooner

## 2019-05-26 NOTE — ED Notes (Signed)
No answer for vitals recheck

## 2019-05-26 NOTE — ED Notes (Signed)
Called to recheck Vitals. No answer 

## 2019-05-26 NOTE — ED Notes (Signed)
Called to recheck vitals. No answer

## 2019-05-28 ENCOUNTER — Telehealth: Payer: Self-pay

## 2019-05-28 DIAGNOSIS — N186 End stage renal disease: Secondary | ICD-10-CM | POA: Diagnosis not present

## 2019-05-28 DIAGNOSIS — D631 Anemia in chronic kidney disease: Secondary | ICD-10-CM | POA: Diagnosis not present

## 2019-05-28 DIAGNOSIS — E1129 Type 2 diabetes mellitus with other diabetic kidney complication: Secondary | ICD-10-CM | POA: Diagnosis not present

## 2019-05-28 DIAGNOSIS — D509 Iron deficiency anemia, unspecified: Secondary | ICD-10-CM | POA: Diagnosis not present

## 2019-05-28 DIAGNOSIS — N2581 Secondary hyperparathyroidism of renal origin: Secondary | ICD-10-CM | POA: Diagnosis not present

## 2019-05-28 DIAGNOSIS — Z992 Dependence on renal dialysis: Secondary | ICD-10-CM | POA: Diagnosis not present

## 2019-05-28 NOTE — Telephone Encounter (Signed)
[facility] called to see if they could move patients appt up some due to some thinning of the aneurysm. Appt moved up a couple days and advised if issues became more of a concern that patient could go to ER or call back to see if we have had any cancellations.   York Cerise, CMA

## 2019-05-31 DIAGNOSIS — Z992 Dependence on renal dialysis: Secondary | ICD-10-CM | POA: Diagnosis not present

## 2019-05-31 DIAGNOSIS — D509 Iron deficiency anemia, unspecified: Secondary | ICD-10-CM | POA: Diagnosis not present

## 2019-05-31 DIAGNOSIS — D631 Anemia in chronic kidney disease: Secondary | ICD-10-CM | POA: Diagnosis not present

## 2019-05-31 DIAGNOSIS — N186 End stage renal disease: Secondary | ICD-10-CM | POA: Diagnosis not present

## 2019-05-31 DIAGNOSIS — E1129 Type 2 diabetes mellitus with other diabetic kidney complication: Secondary | ICD-10-CM | POA: Diagnosis not present

## 2019-05-31 DIAGNOSIS — N2581 Secondary hyperparathyroidism of renal origin: Secondary | ICD-10-CM | POA: Diagnosis not present

## 2019-06-02 ENCOUNTER — Other Ambulatory Visit: Payer: Self-pay

## 2019-06-02 ENCOUNTER — Encounter: Payer: Self-pay | Admitting: *Deleted

## 2019-06-02 ENCOUNTER — Other Ambulatory Visit: Payer: Self-pay | Admitting: *Deleted

## 2019-06-02 ENCOUNTER — Ambulatory Visit (INDEPENDENT_AMBULATORY_CARE_PROVIDER_SITE_OTHER): Payer: Medicare Other | Admitting: Physician Assistant

## 2019-06-02 VITALS — BP 150/99 | HR 102 | Temp 97.4°F | Resp 20 | Ht 73.0 in | Wt 240.3 lb

## 2019-06-02 DIAGNOSIS — L98499 Non-pressure chronic ulcer of skin of other sites with unspecified severity: Secondary | ICD-10-CM | POA: Diagnosis not present

## 2019-06-02 DIAGNOSIS — N186 End stage renal disease: Secondary | ICD-10-CM

## 2019-06-02 DIAGNOSIS — Z992 Dependence on renal dialysis: Secondary | ICD-10-CM | POA: Diagnosis not present

## 2019-06-02 DIAGNOSIS — N2581 Secondary hyperparathyroidism of renal origin: Secondary | ICD-10-CM | POA: Diagnosis not present

## 2019-06-02 DIAGNOSIS — D509 Iron deficiency anemia, unspecified: Secondary | ICD-10-CM | POA: Diagnosis not present

## 2019-06-02 DIAGNOSIS — E1129 Type 2 diabetes mellitus with other diabetic kidney complication: Secondary | ICD-10-CM | POA: Diagnosis not present

## 2019-06-02 DIAGNOSIS — D631 Anemia in chronic kidney disease: Secondary | ICD-10-CM | POA: Diagnosis not present

## 2019-06-02 NOTE — Progress Notes (Signed)
  Established Dialysis Access   History of Present Illness   Patrick Ibarra is a 38 y.o. (01/19/1981) male who presents for re-evaluation of permanent dialysis access.  He is status post left brachiocephalic fistula creation by Dr. Chen in July 2018.  He was referred back to our office due to thinning skin and ulceration near common access sites of fistula.  He denies any spontaneous bleeding however does notice a worsening ulcer and thinning skin in 2 different areas.  He is dialyzing at the [facility] on a Monday Wednesday Friday schedule.  He does not take any blood thinners.  [name] PMH, PSH, SH, and FamHx were reviewed and are unchanged from prior visit.  Current Outpatient Medications  Medication Sig Dispense Refill  . ACCU-CHEK SOFTCLIX LANCETS lancets Use as instructed to check blood sugar once daily. E11.9 100 each 12  . amLODipine (NORVASC) 10 MG tablet Take 0.5 tablets (5 mg total) by mouth daily. 45 tablet 11  . Blood Glucose Monitoring Suppl (ACCU-CHEK AVIVA PLUS) w/Device KIT Use as instructed to check blood sugar once daily. E11.9 1 kit 0  . calcium acetate (PHOSLO) 667 MG capsule Take 1 capsule (667 mg total) by mouth 3 (three) times daily with meals. 90 capsule 5  . carvedilol (COREG) 3.125 MG tablet TAKE 1 TABLET (3.125 MG TOTAL) BY MOUTH 2 (TWO) TIMES DAILY WITH A MEAL. 60 tablet 2  . Cinnamon 500 MG capsule Take 1,000 mg by mouth daily.    . glucose blood test strip USE AS INSTRUCTED  12  . sevelamer carbonate (RENVELA) 800 MG tablet Take 2,400 mg by mouth 3 (three) times daily with meals.     . TRUEPLUS LANCETS 28G MISC Use as directed 100 each 1  . VIAGRA 50 MG tablet TAKE 1 TABLET BY MOUTH AS NEEDED FOR ERECTILE DYSFUNCTION  10   No current facility-administered medications for this visit.     On ROS today: 10 system ROS is negative unless otherwise noted in HPI   Physical Examination   Vitals:   06/02/19 1324  BP: (!) 150/99  Pulse:  (!) 102  Resp: 20  Temp: (!) 97.4 F (36.3 C)  SpO2: 97%  Weight: 240 lb 4.8 oz (109 kg)  Height: 6' 1" (1.854 m)   Body mass index is 31.7 kg/m.  General Alert, O x 3, WD, NAD  Pulmonary Sym exp, good B air movt, CTA B  Cardiac RRR, Nl S1, S2  Vascular Vessel Right Left  Radial Palpable Palpable  Brachial Palpable Palpable  Ulnar Not palpable Not palpable    Musculo- skeletal M/S 5/5 throughout  , Extremities without ischemic changes    Neurologic A&O; CN grossly intact      Medical Decision Making   Patrick Ibarra is a 38 y.o. male who presents with ESRD requiring hemodialysis.   -Patent left arm brachiocephalic fistula with a palpable thrill -Two areas of thinning skin at recurrent access sites; area closest to elbow is more concerning for further erosion and spontaneous hemorrhage -Plan will be for plication procedure of left arm AV fistula.  To avoid a TDC we will likely have to proceed with staged surgeries addressing the more concerning ulcerated area first.  After this incision has healed we can reassess need for plication of second area of thinning skin -Risks and benefits of surgery were discussed with the patient and he agrees to proceed -We will schedule the surgery for the next available Tuesday   or Thursday OR opening   Sallee Hogrefe PA-C [facility] Office: [phone]  Clinic MD: Dr. Fields and Dr. Dickson  

## 2019-06-02 NOTE — H&P (View-Only) (Signed)
Established Dialysis Access   History of Present Illness   Patrick Ibarra is a 38 y.o. (September 10, 1980) male who presents for re-evaluation of permanent dialysis access.  He is status post left brachiocephalic fistula creation by Dr. Bridgett Larsson in July 2018.  He was referred back to our office due to thinning skin and ulceration near common access sites of fistula.  He denies any spontaneous bleeding however does notice a worsening ulcer and thinning skin in 2 different areas.  He is dialyzing at the [facility] center on a Monday Wednesday Friday schedule.  He does not take any blood thinners.  [name] PMH, PSH, SH, and FamHx were reviewed and are unchanged from prior visit.  Current Outpatient Medications  Medication Sig Dispense Refill  . ACCU-CHEK SOFTCLIX LANCETS lancets Use as instructed to check blood sugar once daily. E11.9 100 each 12  . amLODipine (NORVASC) 10 MG tablet Take 0.5 tablets (5 mg total) by mouth daily. 45 tablet 11  . Blood Glucose Monitoring Suppl (ACCU-CHEK AVIVA PLUS) w/Device KIT Use as instructed to check blood sugar once daily. E11.9 1 kit 0  . calcium acetate (PHOSLO) 667 MG capsule Take 1 capsule (667 mg total) by mouth 3 (three) times daily with meals. 90 capsule 5  . carvedilol (COREG) 3.125 MG tablet TAKE 1 TABLET (3.125 MG TOTAL) BY MOUTH 2 (TWO) TIMES DAILY WITH A MEAL. 60 tablet 2  . Cinnamon 500 MG capsule Take 1,000 mg by mouth daily.    Marland Kitchen glucose blood test strip USE AS INSTRUCTED  12  . sevelamer carbonate (RENVELA) 800 MG tablet Take 2,400 mg by mouth 3 (three) times daily with meals.     . TRUEPLUS LANCETS 28G MISC Use as directed 100 each 1  . VIAGRA 50 MG tablet TAKE 1 TABLET BY MOUTH AS NEEDED FOR ERECTILE DYSFUNCTION  10   No current facility-administered medications for this visit.     On ROS today: 10 system ROS is negative unless otherwise noted in HPI   Physical Examination   Vitals:   06/02/19 1324  BP: (!) 150/99  Pulse:  (!) 102  Resp: 20  Temp: (!) 97.4 F (36.3 C)  SpO2: 97%  Weight: 240 lb 4.8 oz (109 kg)  Height: _0  (1.854 m)   Body mass index is 31.7 kg/m.  General Alert, O x 3, WD, NAD  Pulmonary Sym exp, good B air movt, CTA B  Cardiac RRR, Nl S1, S2  Vascular Vessel Right Left  Radial Palpable Palpable  Brachial Palpable Palpable  Ulnar Not palpable Not palpable    Musculo- skeletal M/S 5/5 throughout  , Extremities without ischemic changes    Neurologic A&O; CN grossly intact      Medical Decision Making   Stevenson Windmiller Tanzi is a 38 y.o. male who presents with ESRD requiring hemodialysis.   -Patent left arm brachiocephalic fistula with a palpable thrill -Two areas of thinning skin at recurrent access sites; area closest to elbow is more concerning for further erosion and spontaneous hemorrhage -Plan will be for plication procedure of left arm AV fistula.  To avoid a TDC we will likely have to proceed with staged surgeries addressing the more concerning ulcerated area first.  After this incision has healed we can reassess need for plication of second area of thinning skin -Risks and benefits of surgery were discussed with the patient and he agrees to proceed -We will schedule the surgery for the next available Tuesday  or Thursday OR opening   Dagoberto Ligas PA-C [facility] [place] Office: [phone]  Clinic MD: Dr. Oneida Alar and Dr. Scot Dock

## 2019-06-03 DIAGNOSIS — N186 End stage renal disease: Secondary | ICD-10-CM | POA: Diagnosis not present

## 2019-06-03 DIAGNOSIS — I129 Hypertensive chronic kidney disease with stage 1 through stage 4 chronic kidney disease, or unspecified chronic kidney disease: Secondary | ICD-10-CM | POA: Diagnosis not present

## 2019-06-03 DIAGNOSIS — Z992 Dependence on renal dialysis: Secondary | ICD-10-CM | POA: Diagnosis not present

## 2019-06-04 ENCOUNTER — Ambulatory Visit: Payer: Medicare Other

## 2019-06-04 DIAGNOSIS — E1129 Type 2 diabetes mellitus with other diabetic kidney complication: Secondary | ICD-10-CM | POA: Diagnosis not present

## 2019-06-04 DIAGNOSIS — N186 End stage renal disease: Secondary | ICD-10-CM | POA: Diagnosis not present

## 2019-06-04 DIAGNOSIS — D509 Iron deficiency anemia, unspecified: Secondary | ICD-10-CM | POA: Diagnosis not present

## 2019-06-04 DIAGNOSIS — D631 Anemia in chronic kidney disease: Secondary | ICD-10-CM | POA: Diagnosis not present

## 2019-06-04 DIAGNOSIS — Z992 Dependence on renal dialysis: Secondary | ICD-10-CM | POA: Diagnosis not present

## 2019-06-04 DIAGNOSIS — N2581 Secondary hyperparathyroidism of renal origin: Secondary | ICD-10-CM | POA: Diagnosis not present

## 2019-06-07 ENCOUNTER — Other Ambulatory Visit (HOSPITAL_COMMUNITY)
Admission: RE | Admit: 2019-06-07 | Discharge: 2019-06-07 | Disposition: A | Discharge status: Home / Self Care | Payer: Medicare Other | Source: Ambulatory Visit | Attending: Vascular Surgery | Admitting: Vascular Surgery

## 2019-06-07 DIAGNOSIS — Z20828 Contact with and (suspected) exposure to other viral communicable diseases: Secondary | ICD-10-CM | POA: Diagnosis not present

## 2019-06-07 DIAGNOSIS — Z01812 Encounter for preprocedural laboratory examination: Secondary | ICD-10-CM | POA: Diagnosis not present

## 2019-06-08 ENCOUNTER — Other Ambulatory Visit: Payer: Self-pay

## 2019-06-08 ENCOUNTER — Encounter (HOSPITAL_COMMUNITY): Payer: Self-pay | Admitting: *Deleted

## 2019-06-08 LAB — NOVEL CORONAVIRUS, NAA (HOSP ORDER, SEND-OUT TO REF LAB; TAT 18-24 HRS): SARS-CoV-2, NAA: NOT DETECTED

## 2019-06-08 NOTE — Progress Notes (Signed)
Pt denies SOB, chest pain, and being under the care of a cardiologist. Pt requested that spouse complete SDW-pre-op call. Spouse stated that pt PCP is Dr. Alan Mulder. Spouse denies that pt had a cardiac cath. Spouse denies that pt had an EKG and chest x ray in the last year. Spouse made aware to have pt stop taking  vitamins, fish oil and herbal medications. Do not take any NSAIDs ie: Ibuprofen, Advil, Naproxen (Aleve), Motrin, BC and Goody Powder. Spouse made aware to have pt check CBG every 2 hours prior to arrival to hospital on DOS. Spouse made aware to treat a BG < 70 with  4 ounces of apple or cranberry juice, wait 15 minutes after intervention to recheck BG, if BG remains < 70, call Short Stay unit to speak with a nurse. Spouse verbalized understanding of all pre-op instructions.

## 2019-06-09 DIAGNOSIS — Z992 Dependence on renal dialysis: Secondary | ICD-10-CM | POA: Diagnosis not present

## 2019-06-09 DIAGNOSIS — N186 End stage renal disease: Secondary | ICD-10-CM | POA: Diagnosis not present

## 2019-06-09 DIAGNOSIS — D631 Anemia in chronic kidney disease: Secondary | ICD-10-CM | POA: Diagnosis not present

## 2019-06-09 DIAGNOSIS — E1129 Type 2 diabetes mellitus with other diabetic kidney complication: Secondary | ICD-10-CM | POA: Diagnosis not present

## 2019-06-09 DIAGNOSIS — N2581 Secondary hyperparathyroidism of renal origin: Secondary | ICD-10-CM | POA: Diagnosis not present

## 2019-06-09 DIAGNOSIS — D509 Iron deficiency anemia, unspecified: Secondary | ICD-10-CM | POA: Diagnosis not present

## 2019-06-10 ENCOUNTER — Other Ambulatory Visit: Payer: Self-pay

## 2019-06-10 ENCOUNTER — Encounter (HOSPITAL_COMMUNITY): Payer: Self-pay | Admitting: Certified Registered"

## 2019-06-10 ENCOUNTER — Telehealth: Payer: Self-pay | Admitting: Internal Medicine

## 2019-06-10 ENCOUNTER — Encounter: Payer: Self-pay | Admitting: Internal Medicine

## 2019-06-10 ENCOUNTER — Encounter (HOSPITAL_COMMUNITY): Payer: Self-pay | Admitting: Anesthesiology

## 2019-06-10 ENCOUNTER — Encounter (HOSPITAL_COMMUNITY)
Admission: RE | Disposition: A | Discharge status: Home / Self Care | Payer: Self-pay | Source: Home / Self Care | Attending: Vascular Surgery

## 2019-06-10 ENCOUNTER — Ambulatory Visit (HOSPITAL_BASED_OUTPATIENT_CLINIC_OR_DEPARTMENT_OTHER): Payer: Medicare Other | Admitting: Internal Medicine

## 2019-06-10 ENCOUNTER — Ambulatory Visit (HOSPITAL_COMMUNITY)
Admission: RE | Admit: 2019-06-10 | Discharge: 2019-06-10 | Disposition: A | Discharge status: Home / Self Care | Payer: Medicare Other | Attending: Vascular Surgery | Admitting: Vascular Surgery

## 2019-06-10 VITALS — BP 162/95 | HR 89 | Temp 98.2°F | Resp 18 | Ht 73.0 in | Wt 245.0 lb

## 2019-06-10 DIAGNOSIS — Z79899 Other long term (current) drug therapy: Secondary | ICD-10-CM | POA: Diagnosis not present

## 2019-06-10 DIAGNOSIS — Z992 Dependence on renal dialysis: Secondary | ICD-10-CM | POA: Diagnosis not present

## 2019-06-10 DIAGNOSIS — Z23 Encounter for immunization: Secondary | ICD-10-CM

## 2019-06-10 DIAGNOSIS — E669 Obesity, unspecified: Secondary | ICD-10-CM | POA: Insufficient documentation

## 2019-06-10 DIAGNOSIS — L602 Onychogryphosis: Secondary | ICD-10-CM

## 2019-06-10 DIAGNOSIS — I132 Hypertensive heart and chronic kidney disease with heart failure and with stage 5 chronic kidney disease, or end stage renal disease: Secondary | ICD-10-CM | POA: Diagnosis not present

## 2019-06-10 DIAGNOSIS — N186 End stage renal disease: Secondary | ICD-10-CM | POA: Diagnosis not present

## 2019-06-10 DIAGNOSIS — I509 Heart failure, unspecified: Secondary | ICD-10-CM | POA: Diagnosis not present

## 2019-06-10 DIAGNOSIS — I10 Essential (primary) hypertension: Secondary | ICD-10-CM | POA: Diagnosis not present

## 2019-06-10 DIAGNOSIS — D631 Anemia in chronic kidney disease: Secondary | ICD-10-CM | POA: Insufficient documentation

## 2019-06-10 DIAGNOSIS — Z6832 Body mass index (BMI) 32.0-32.9, adult: Secondary | ICD-10-CM | POA: Insufficient documentation

## 2019-06-10 DIAGNOSIS — E119 Type 2 diabetes mellitus without complications: Secondary | ICD-10-CM | POA: Diagnosis not present

## 2019-06-10 DIAGNOSIS — E1122 Type 2 diabetes mellitus with diabetic chronic kidney disease: Secondary | ICD-10-CM | POA: Diagnosis not present

## 2019-06-10 DIAGNOSIS — Z5309 Procedure and treatment not carried out because of other contraindication: Secondary | ICD-10-CM | POA: Diagnosis not present

## 2019-06-10 HISTORY — DX: Type 2 diabetes mellitus without complications: E11.9

## 2019-06-10 HISTORY — DX: Chronic kidney disease, unspecified: N18.9

## 2019-06-10 LAB — POCT GLYCOSYLATED HEMOGLOBIN (HGB A1C): Hemoglobin A1C: 4.7 % (ref 4.0–5.6)

## 2019-06-10 LAB — POCT I-STAT, CHEM 8
BUN: 50 mg/dL — ABNORMAL HIGH (ref 6–20)
Calcium, Ion: 1.06 mmol/L — ABNORMAL LOW (ref 1.15–1.40)
Chloride: 98 mmol/L (ref 98–111)
Creatinine, Ser: 16.1 mg/dL — ABNORMAL HIGH (ref 0.61–1.24)
Glucose, Bld: 90 mg/dL (ref 70–99)
HCT: 26 % — ABNORMAL LOW (ref 39.0–52.0)
Hemoglobin: 8.8 g/dL — ABNORMAL LOW (ref 13.0–17.0)
Potassium: 4.7 mmol/L (ref 3.5–5.1)
Sodium: 138 mmol/L (ref 135–145)
TCO2: 27 mmol/L (ref 22–32)

## 2019-06-10 SURGERY — FISTULA SUPERFICIALIZATION
Anesthesia: Monitor Anesthesia Care | Laterality: Left

## 2019-06-10 MED ORDER — LABETALOL HCL 5 MG/ML IV SOLN
INTRAVENOUS | Status: AC
  Filled 2019-06-10: qty 4

## 2019-06-10 MED ORDER — LIDOCAINE 2% (20 MG/ML) 5 ML SYRINGE
INTRAMUSCULAR | Status: AC
  Filled 2019-06-10: qty 5

## 2019-06-10 MED ORDER — MIDAZOLAM HCL 2 MG/2ML IJ SOLN
INTRAMUSCULAR | Status: AC
  Filled 2019-06-10: qty 2

## 2019-06-10 MED ORDER — CHLORHEXIDINE GLUCONATE 4 % EX LIQD: 60.0000 mL | Freq: Once | CUTANEOUS | Status: DC

## 2019-06-10 MED ORDER — ONDANSETRON HCL 4 MG/2ML IJ SOLN
INTRAMUSCULAR | Status: AC
  Filled 2019-06-10: qty 2

## 2019-06-10 MED ORDER — SODIUM CHLORIDE 0.9 % IV SOLN
INTRAVENOUS | Status: DC
  Administered 2019-06-10: 08:00:00 via INTRAVENOUS

## 2019-06-10 MED ORDER — LABETALOL HCL 5 MG/ML IV SOLN
10.0000 mg | Freq: Once | INTRAVENOUS | Status: AC
  Administered 2019-06-10: 09:00:00 10 mg via INTRAVENOUS

## 2019-06-10 MED ORDER — FENTANYL CITRATE (PF) 250 MCG/5ML IJ SOLN
INTRAMUSCULAR | Status: AC
  Filled 2019-06-10: qty 5

## 2019-06-10 MED ORDER — HYDRALAZINE HCL 20 MG/ML IJ SOLN
INTRAMUSCULAR | Status: AC
  Administered 2019-06-10: 10 mg via INTRAVENOUS
  Filled 2019-06-10: qty 1

## 2019-06-10 MED ORDER — CEFAZOLIN SODIUM-DEXTROSE 2-4 GM/100ML-% IV SOLN
2.0000 g | INTRAVENOUS | Status: DC
  Filled 2019-06-10: qty 100

## 2019-06-10 MED ORDER — HYDRALAZINE HCL 20 MG/ML IJ SOLN
10.0000 mg | Freq: Once | INTRAMUSCULAR | Status: AC
  Administered 2019-06-10: 09:00:00 10 mg via INTRAVENOUS

## 2019-06-10 MED ORDER — AMLODIPINE BESYLATE 10 MG PO TABS: 10.0000 mg | ORAL_TABLET | Freq: Every day | ORAL | 5 refills | Status: DC

## 2019-06-10 MED ORDER — PROPOFOL 1000 MG/100ML IV EMUL
INTRAVENOUS | Status: AC
  Filled 2019-06-10: qty 100

## 2019-06-10 MED ORDER — SODIUM CHLORIDE 0.9 % IV SOLN: INTRAVENOUS | Status: DC

## 2019-06-10 MED FILL — AMLODIPINE BESYLATE 10 MG T: 10 | 30 days supply | Qty: 30 | Fill #0

## 2019-06-10 NOTE — Progress Notes (Signed)
Dr. Donnetta Hutching met w/patient at bedside to discuss cancellation of surgery; encouraged medical follow-up as soon as possible to get BP under control; hope to reschedule surgery in the next week or so.  This RN walked patient out to his car; answered questions of wife and patient; also encouraged them to f/u w/PCP as soon as possible.

## 2019-06-10 NOTE — Telephone Encounter (Signed)
Patient is here.. fax is needed

## 2019-06-10 NOTE — Progress Notes (Signed)
Pt w/BP 204/124.  Dr. Renold Don, anesthesiologist, notified.  Verbal order received for Hydralazine 10mg  IVP.

## 2019-06-10 NOTE — Progress Notes (Signed)
Patient ID: Patrick Ibarra, male    DOB: 1981-05-18  MRN: [id number]  CC:   Subjective: Patrick Ibarra is a 38 y.o. male who presents for chronic ds management.  Wife is with him today His concerns today include:  ESRD, HTN, DM  ESRD/HTN:  On HD M/W/F Was scheduled to have surgery on his dialysis graft in the left upper arm by vascular today.  However surgery had to be canceled due to elevated blood pressure.  Patient advised to see his PCP to have blood pressure addressed.  They hope to do the surgery next week.  Patient is on carvedilol and Norvasc.  He did not take his Norvasc this morning before the procedure.  Looks like he was given some labetalol to try to get his blood pressure down. Does have device at home but not checking blood pressure because it is checked during hemodialysis.  He states that blood pressure is normally high before dialysis session and lower post HD Limits salt in foods No CP/SOB/LE  DM: He is been well controlled off medications for some time.  Reports that blood sugars checked at hemodialysis session and usual range is  90-110 Does strength straining but not much cardio exercise Wife requests referral to podiatrist to have his nails cut. He thinks he had an eye exam done last year.  Denies any blurred vision at this time. Denies any numbness or tingling in the feet.  HM:  Agreeable to flu shot Patient Active Problem List   Diagnosis Date Noted  . Skin ulcer of upper arm ([place]) 06/02/2019  . Pre-transplant evaluation for kidney transplant 09/05/2017  . Diabetes mellitus type II, uncontrolled ([place]) 09/04/2017  . Mallory-Weiss tear 09/04/2017  . Reactive depression 09/04/2017  . Pancreatitis, acute 04/04/2017  . GI bleed 04/03/2017  . Anemia of chronic disease 04/03/2017  . CHF (congestive heart failure) ([place]) 03/01/2017  . Obesity 03/01/2017  . Anemia of chronic kidney failure 03/01/2017  . Essential hypertension 01/05/2014  . Diabetes mellitus ([place])  12/24/2011  . ESRD (end stage renal disease) ([facility] [facility]) 12/24/2011     Current Outpatient Medications on File Prior to Visit  Medication Sig Dispense Refill  . ACCU-CHEK SOFTCLIX LANCETS lancets Use as instructed to check blood sugar once daily. E11.9 100 each 12  . amLODipine (NORVASC) 5 MG tablet Take 5 mg by mouth daily.    . Blood Glucose Monitoring Suppl (ACCU-CHEK AVIVA PLUS) w/Device KIT Use as instructed to check blood sugar once daily. E11.9 1 kit 0  . carvedilol (COREG) 3.125 MG tablet TAKE 1 TABLET (3.125 MG TOTAL) BY MOUTH 2 (TWO) TIMES DAILY WITH A MEAL. 60 tablet 2  . glucose blood test strip USE AS INSTRUCTED  12  . Magnesium Sulfate, Laxative, (EPSOM SALT PO) Apply 1 application topically as needed (foot pain.). Epsom Salts Cream    . sevelamer carbonate (RENVELA) 800 MG tablet Take 2,400-3,200 mg by mouth See admin instructions. Take 4 tablets (3200 mg) by mouth with each meal & take 3 tablets (2400 mg) by mouth with each snack    . TRUEPLUS LANCETS 28G MISC Use as directed 100 each 1   No current facility-administered medications on file prior to visit.     No Known Allergies  Social History   Socioeconomic History  . Marital status: Married    Spouse name: Not on file  . Number of children: Not on file  . Years of education: Not on file  . Highest education level:  Not on file  Occupational History  . Not on file  Social Needs  . Financial resource strain: Not on file  . Food insecurity    Worry: Not on file    Inability: Not on file  . Transportation needs    Medical: Not on file    Non-medical: Not on file  Tobacco Use  . Smoking status: Never Smoker  . Smokeless tobacco: Never Used  Substance and Sexual Activity  . Alcohol use: Not Currently  . Drug use: No  . Sexual activity: Yes  Lifestyle  . Physical activity    Days per week: Not on file    Minutes per session: Not on file  . Stress: Not on file  Relationships  . Social Herbalist on  phone: Not on file    Gets together: Not on file    Attends religious service: Not on file    Active member of club or organization: Not on file    Attends meetings of clubs or organizations: Not on file    Relationship status: Not on file  . Intimate partner violence    Fear of current or ex partner: Not on file    Emotionally abused: Not on file    Physically abused: Not on file    Forced sexual activity: Not on file  Other Topics Concern  . Not on file  Social History Narrative   He is married with step kids. He works at [employer].     Family History  Problem Relation Age of Onset  . Hypertension Mother   . Diabetes Mother   . Hypertension Father     Past Surgical History:  Procedure Laterality Date  . AV FISTULA PLACEMENT Left 04/10/2017   Procedure: ARTERIOVENOUS (AV) FISTULA CREATION LEFT ARM;  Surgeon: Conrad [place], MD;  Location: [facility];  Service: Vascular;  Laterality: Left;  . ESOPHAGOGASTRODUODENOSCOPY (EGD) WITH PROPOFOL N/A 04/04/2017   Procedure: ESOPHAGOGASTRODUODENOSCOPY (EGD) WITH PROPOFOL;  Surgeon: Carol Ada, MD;  Location: [facility];  Service: Endoscopy;  Laterality: N/A;  . INSERTION OF DIALYSIS CATHETER Right 04/10/2017   Procedure: INSERTION OF DIALYSIS CATHETER - RIGHT INTERNAL JUGULAR PLACEMENT;  Surgeon: Conrad Benton, MD;  Location: [facility];  Service: Vascular;  Laterality: Right;  . TONSILLECTOMY AND ADENOIDECTOMY      ROS: Review of Systems Negative except as stated above  PHYSICAL EXAM: BP (!) 162/95 (BP Location: Right Arm, Patient Position: Sitting, Cuff Size: Normal)   Pulse 89   Temp 98.2 F (36.8 C) (Oral)   Resp 18   Ht 6' 1" (1.854 m)   Wt 245 lb (111.1 kg)   SpO2 100%   BMI 32.32 kg/m   Wt Readings from Last 3 Encounters:  06/10/19 245 lb (111.1 kg)  06/10/19 240 lb (108.9 kg)  06/02/19 240 lb 4.8 oz (109 kg)  Repeat BP 190/120 Physical Exam General appearance - alert, well appearing, middle-aged African-American  male and in no distress Mental status - normal mood, behavior, speech, dress, motor activity, and thought processes Eyes -pale conjunctiva  nose - normal and patent, no erythema, discharge or polyps Mouth - mucous membranes moist, pharynx normal without lesions Neck - supple, no significant adenopathy Chest - clear to auscultation, no wheezes, rales or rhonchi, symmetric air entry Heart - normal rate, regular rhythm, normal S1, S2, no murmurs, rubs, clicks or gallops Extremities -serpiginous graft in the left upper arm. Diabetic Foot Exam - Simple   Simple  Foot Form Visual Inspection See comments: Yes Sensation Testing Intact to touch and monofilament testing bilaterally: Yes Pulse Check Posterior Tibialis and Dorsalis pulse intact bilaterally: Yes Comments Flat-footed.  Toenails mildly overgrown     CMP Latest Ref Rng & Units 06/10/2019 12/31/2017 04/11/2017  Glucose 70 - 99 mg/dL 90 115(H) 115(H)  BUN 6 - 20 mg/dL 50(H) 24(H) 38(H)  Creatinine 0.61 - 1.24 mg/dL 16.10(H) 8.09(H) 12.17(H)  Sodium 135 - 145 mmol/L 138 137 136  Potassium 3.5 - 5.1 mmol/L 4.7 3.7 4.6  Chloride 98 - 111 mmol/L 98 91(L) 101  CO2 22 - 32 mmol/L - 27 23  Calcium 8.9 - 10.3 mg/dL - 8.6(L) 8.2(L)  Total Protein 6.5 - 8.1 g/dL - - -  Total Bilirubin 0.3 - 1.2 mg/dL - - -  Alkaline Phos 38 - 126 U/L - - -  AST 15 - 41 U/L - - -  ALT 17 - 63 U/L - - -   Lipid Panel  No results found for: CHOL, TRIG, HDL, CHOLHDL, VLDL, LDLCALC, LDLDIRECT  CBC    Component Value Date/Time   WBC 7.5 12/31/2017 1819   RBC 4.38 12/31/2017 1819   HGB 8.8 (L) 06/10/2019 0843   HGB 7.3 (L) 03/18/2017 0942   HCT 26.0 (L) 06/10/2019 0843   HCT 21.8 (L) 03/18/2017 0942   PLT 253 12/31/2017 1819   PLT 143 (L) 03/18/2017 0942   MCV 90.0 12/31/2017 1819   MCV 81 03/18/2017 0942   MCH 30.4 12/31/2017 1819   MCHC 33.8 12/31/2017 1819   RDW 14.9 12/31/2017 1819   RDW 17.0 (H) 03/18/2017 0942   LYMPHSABS 1.0 12/21/2013 0510    MONOABS 0.1 12/21/2013 0510   EOSABS 0.0 12/21/2013 0510   BASOSABS 0.0 12/21/2013 0510   Results for orders placed or performed during the hospital encounter of 06/10/19  I-STAT, chem 8  Result Value Ref Range   Sodium 138 135 - 145 mmol/L   Potassium 4.7 3.5 - 5.1 mmol/L   Chloride 98 98 - 111 mmol/L   BUN 50 (H) 6 - 20 mg/dL   Creatinine, Ser 16.10 (H) 0.61 - 1.24 mg/dL   Glucose, Bld 90 70 - 99 mg/dL   Calcium, Ion 1.06 (L) 1.15 - 1.40 mmol/L   TCO2 27 22 - 32 mmol/L   Hemoglobin 8.8 (L) 13.0 - 17.0 g/dL   HCT 26.0 (L) 39.0 - 52.0 %   A1C 4.7  ASSESSMENT AND PLAN: 1. Essential hypertension Not at goal.  Increase Norvasc to 10 mg daily.  Advised to take both blood pressure medications on the morning of his surgery.  We will have him see a clinical pharmacist in 1 week for blood pressure recheck - amLODipine (NORVASC) 10 MG tablet; Take 1 tablet (10 mg total) by mouth daily.  Dispense: 30 tablet; Refill: 5 - Lipid panel - CBC - Hepatic Function Panel  2. Controlled type 2 diabetes mellitus without complication, without long-term current use of insulin (HCC) Well-controlled off medications.  Continue healthy eating habits.  Encouraged him to try to walk several days a week for 30 minutes as tolerated - Ambulatory referral to Ophthalmology  3. Obesity (BMI 30.0-34.9) See #2 above  4. ESRD on hemodialysis ([facility])  5. Need for influenza vaccination Given  6. Overgrown toenails - Ambulatory referral to Podiatry    Patient was given the opportunity to ask questions.  Patient verbalized understanding of the plan and was able to repeat key elements of the plan.  No orders of the defined types were placed in this encounter.    Requested Prescriptions    No prescriptions requested or ordered in this encounter    No follow-ups on file.  Karle Plumber, MD, FACP

## 2019-06-10 NOTE — Progress Notes (Signed)
Pt BP 190/112; HR 86.  Dr. Fransisco Beau updated once again.  States he has spoken w/Dr. Donnetta Hutching and surgery will be cancelled for today.  Dr. Donnetta Hutching will be in to speak to patient re: follow-up.

## 2019-06-10 NOTE — Anesthesia Preprocedure Evaluation (Deleted)
Anesthesia Evaluation    Reviewed: Allergy & Precautions, Patient's Chart, lab work & pertinent test results  History of Anesthesia Complications Negative for: history of anesthetic complications  Airway        Dental   Pulmonary neg pulmonary ROS,           Cardiovascular hypertension, Pt. on medications and Pt. on home beta blockers +CHF     '20 Myoperfusion - Nuclear stress EF: 49%. There was no ST segment deviation noted during stress. The study is normal. This is a low risk study. The LVEF is mildly decreased (45-54%).    Neuro/Psych PSYCHIATRIC DISORDERS Depression negative neurological ROS     GI/Hepatic Neg liver ROS,  Hx mallory-weiss tear    Endo/Other  diabetes, Type 2 Obesity   Renal/GU ESRF and DialysisRenal disease     Musculoskeletal negative musculoskeletal ROS (+)   Abdominal   Peds  Hematology negative hematology ROS (+)   Anesthesia Other Findings   Reproductive/Obstetrics                             Anesthesia Physical Anesthesia Plan  ASA: III  Anesthesia Plan: MAC   Post-op Pain Management:    Induction: Intravenous  PONV Risk Score and Plan: 1 and Propofol infusion and Treatment may vary due to age or medical condition  Airway Management Planned: Natural Airway and Simple Face Mask  Additional Equipment: None  Intra-op Plan:   Post-operative Plan:   Informed Consent:   Plan Discussed with: CRNA and Anesthesiologist  Anesthesia Plan Comments: (Case postponed due to elevated blood pressure measurements in preop despite multiple antihypertensives )       Anesthesia Quick Evaluation

## 2019-06-10 NOTE — Patient Instructions (Signed)
Increase Norvasc to 10 mg daily.

## 2019-06-10 NOTE — Telephone Encounter (Signed)
Becky from [facility] called to let providers know a Surgery Clearance paperwork will be faxed over today as patient has an appt. Please complete and fax back as soon as possible.

## 2019-06-10 NOTE — Progress Notes (Signed)
BP now 189/114 s/p Hydralazine IVP.  Dr. Fransisco Beau again notified of BP, as well as iSTAT 8 - Creat 16.1, Hgb 8.8.  HR 86.  New verbal order received for Labetalol 10mg  IVP.  Continue to monitor closely.

## 2019-06-11 ENCOUNTER — Telehealth: Payer: Self-pay | Admitting: Internal Medicine

## 2019-06-11 DIAGNOSIS — N186 End stage renal disease: Secondary | ICD-10-CM | POA: Diagnosis not present

## 2019-06-11 DIAGNOSIS — Z992 Dependence on renal dialysis: Secondary | ICD-10-CM | POA: Diagnosis not present

## 2019-06-11 DIAGNOSIS — N2581 Secondary hyperparathyroidism of renal origin: Secondary | ICD-10-CM | POA: Diagnosis not present

## 2019-06-11 DIAGNOSIS — D509 Iron deficiency anemia, unspecified: Secondary | ICD-10-CM | POA: Diagnosis not present

## 2019-06-11 DIAGNOSIS — D631 Anemia in chronic kidney disease: Secondary | ICD-10-CM | POA: Diagnosis not present

## 2019-06-11 DIAGNOSIS — E1129 Type 2 diabetes mellitus with other diabetic kidney complication: Secondary | ICD-10-CM | POA: Diagnosis not present

## 2019-06-11 LAB — HEPATIC FUNCTION PANEL
ALT: 9 IU/L (ref 0–44)
AST: 8 IU/L (ref 0–40)
Albumin: 4.2 g/dL (ref 4.0–5.0)
Alkaline Phosphatase: 41 IU/L (ref 39–117)
Bilirubin Total: 0.3 mg/dL (ref 0.0–1.2)
Bilirubin, Direct: 0.1 mg/dL (ref 0.00–0.40)
Total Protein: 8 g/dL (ref 6.0–8.5)

## 2019-06-11 LAB — CBC
Hematocrit: 27.8 % — ABNORMAL LOW (ref 37.5–51.0)
Hemoglobin: 9.6 g/dL — ABNORMAL LOW (ref 13.0–17.7)
MCH: 31.3 pg (ref 26.6–33.0)
MCHC: 34.5 g/dL (ref 31.5–35.7)
MCV: 91 fL (ref 79–97)
Platelets: 227 10*3/uL (ref 150–450)
RBC: 3.07 x10E6/uL — ABNORMAL LOW (ref 4.14–5.80)
RDW: 14.5 % (ref 11.6–15.4)
WBC: 9 10*3/uL (ref 3.4–10.8)

## 2019-06-11 LAB — LIPID PANEL
Chol/HDL Ratio: 4.3 ratio (ref 0.0–5.0)
Cholesterol, Total: 232 mg/dL — ABNORMAL HIGH (ref 100–199)
HDL: 54 mg/dL (ref >39)
LDL Chol Calc (NIH): 136 mg/dL — ABNORMAL HIGH (ref 0–99)
Triglycerides: 234 mg/dL — ABNORMAL HIGH (ref 0–149)
VLDL Cholesterol Cal: 42 mg/dL — ABNORMAL HIGH (ref 5–40)

## 2019-06-11 MED ORDER — ACCU-CHEK AVIVA PLUS W/DEVICE KIT: PACK | 0 refills | Status: DC

## 2019-06-11 MED ORDER — ACCU-CHEK SOFTCLIX LANCETS MISC: 12 refills | Status: DC

## 2019-06-11 NOTE — Telephone Encounter (Signed)
Patients meter continues to give error codes when checking sugar levels. New meter reordered.

## 2019-06-11 NOTE — Telephone Encounter (Signed)
Patients wife called for new meter and would like it sent to [facility] and she wants the RMA to call her at [phone]

## 2019-06-12 ENCOUNTER — Other Ambulatory Visit: Payer: Self-pay | Admitting: Internal Medicine

## 2019-06-12 MED ORDER — LOVASTATIN 20 MG PO TABS: 20.0000 mg | ORAL_TABLET | Freq: Every day | ORAL | 3 refills | Status: DC

## 2019-06-14 ENCOUNTER — Telehealth: Payer: Self-pay | Admitting: *Deleted

## 2019-06-14 DIAGNOSIS — D631 Anemia in chronic kidney disease: Secondary | ICD-10-CM | POA: Diagnosis not present

## 2019-06-14 DIAGNOSIS — Z992 Dependence on renal dialysis: Secondary | ICD-10-CM | POA: Diagnosis not present

## 2019-06-14 DIAGNOSIS — D509 Iron deficiency anemia, unspecified: Secondary | ICD-10-CM | POA: Diagnosis not present

## 2019-06-14 DIAGNOSIS — E1129 Type 2 diabetes mellitus with other diabetic kidney complication: Secondary | ICD-10-CM | POA: Diagnosis not present

## 2019-06-14 DIAGNOSIS — N2581 Secondary hyperparathyroidism of renal origin: Secondary | ICD-10-CM | POA: Diagnosis not present

## 2019-06-14 DIAGNOSIS — N186 End stage renal disease: Secondary | ICD-10-CM | POA: Diagnosis not present

## 2019-06-14 MED FILL — LOVASTATIN 20 MG TABS: 20 | 30 days supply | Qty: 30 | Fill #0

## 2019-06-14 NOTE — Telephone Encounter (Signed)
Spoke with patient's wife. Instructed patient to call this nurse after follow-up visit for B/P check with PCP next week to re-schedule surgery if blood pressure improved. Verbalized understanding.

## 2019-06-17 ENCOUNTER — Other Ambulatory Visit: Payer: Self-pay | Admitting: Pharmacist

## 2019-06-17 ENCOUNTER — Ambulatory Visit: Payer: Medicare Other | Attending: Internal Medicine | Admitting: Pharmacist

## 2019-06-17 ENCOUNTER — Encounter: Payer: Self-pay | Admitting: Pharmacist

## 2019-06-17 ENCOUNTER — Other Ambulatory Visit: Payer: Self-pay | Admitting: *Deleted

## 2019-06-17 ENCOUNTER — Other Ambulatory Visit: Payer: Self-pay

## 2019-06-17 VITALS — BP 137/92 | HR 92

## 2019-06-17 DIAGNOSIS — E119 Type 2 diabetes mellitus without complications: Secondary | ICD-10-CM

## 2019-06-17 DIAGNOSIS — I10 Essential (primary) hypertension: Secondary | ICD-10-CM | POA: Diagnosis not present

## 2019-06-17 MED ORDER — ACCU-CHEK FASTCLIX LANCETS MISC: 11 refills | Status: AC

## 2019-06-17 MED ORDER — ACCU-CHEK GUIDE VI STRP: ORAL_STRIP | 12 refills | Status: AC

## 2019-06-17 MED ORDER — ACCU-CHEK GUIDE ME W/DEVICE KIT: 1.0000 | PACK | Freq: Every day | 0 refills | Status: AC

## 2019-06-17 NOTE — Patient Instructions (Signed)

## 2019-06-17 NOTE — Progress Notes (Signed)
   S:    PCP: Dr. Wynetta Emery  Patient arrives in good spirits. Presents to the clinic for hypertension evaluation, counseling, and management.  Patient was referred and last seen by Primary Care Provider on 06/10/19. Dr. Wynetta Emery increased his amlodipine.    Patient reports adherence with medications.  Current BP Medications include:  Amlodipine 10 mg daily, carvedilol 3.125 mg BID  Dietary habits include: limits salt; restricts caffeine Exercise habits include: endorses 45 minutes daily Family / Social history:  - HTN (mother, father), DM (mother) - Never smoker  - Denies drinking alcohol  O:  L arm after 5 minutes rest: 137/92, HR 92  Home BP readings: not checking at home; checked at dialysis (MWF)  Last 3 Office BP readings: BP Readings from Last 3 Encounters:  06/17/19 (!) 137/92  06/10/19 (!) 162/95  06/10/19 (!) 190/109   BMET    Component Value Date/Time   NA 138 06/10/2019 0843   NA 134 03/18/2017 0942   K 4.7 06/10/2019 0843   CL 98 06/10/2019 0843   CO2 27 12/31/2017 1819   GLUCOSE 90 06/10/2019 0843   BUN 50 (H) 06/10/2019 0843   BUN 135 (HH) 03/18/2017 0942   CREATININE 16.10 (H) 06/10/2019 0843   CREATININE 3.16 (H) 01/05/2014 1459   CALCIUM 8.6 (L) 12/31/2017 1819   CALCIUM 7.4 (L) 03/01/2017 0752   GFRNONAA 8 (L) 12/31/2017 1819   GFRAA 9 (L) 12/31/2017 1819   Renal function: Estimated Creatinine Clearance: 8.3 mL/min (A) (by C-G formula based on SCr of 16.1 mg/dL (H)).  Clinical ASCVD: No  The ASCVD Risk score Mikey Bussing DC Jr., et al., 2013) failed to calculate for the following reasons:   The 2013 ASCVD risk score is only valid for ages 74 to 69   A/P: Hypertension longstanding currently above goal but improved on current medications. BP Goal = <130/80 mmHg. Patient is adherent with current medications.  Will have him follow-up in two weeks to recheck. -Continued current regimen.  -Counseled on lifestyle modifications for blood pressure control  including reduced dietary sodium, increased exercise, adequate sleep  Results reviewed and written information provided.   Total time in face-to-face counseling 15 minutes.   F/U Clinic Visit in 2 weeks.    Benard Halsted, PharmD, [place] [phone]

## 2019-06-17 NOTE — Progress Notes (Signed)
Spoke with patient to reschedule surgery after visit at clinic for elevated B/P. Improved after increase in medication x 1 week and approved to re-schedule. Instructed to be at [facility] admitting at 7:00 am or as directed by the hospital. NPO past MN night prior and must have a driver for discharge. Expect a call and follow the detailed surgery instructions received from the hospital preadmission department. Nasal swab testing at 9:30 am on 06/19/2019 at [facility]. Verbalized understanding of all instructions.

## 2019-06-18 DIAGNOSIS — Z992 Dependence on renal dialysis: Secondary | ICD-10-CM | POA: Diagnosis not present

## 2019-06-18 DIAGNOSIS — N186 End stage renal disease: Secondary | ICD-10-CM | POA: Diagnosis not present

## 2019-06-18 DIAGNOSIS — D631 Anemia in chronic kidney disease: Secondary | ICD-10-CM | POA: Diagnosis not present

## 2019-06-18 DIAGNOSIS — D509 Iron deficiency anemia, unspecified: Secondary | ICD-10-CM | POA: Diagnosis not present

## 2019-06-18 DIAGNOSIS — N2581 Secondary hyperparathyroidism of renal origin: Secondary | ICD-10-CM | POA: Diagnosis not present

## 2019-06-18 DIAGNOSIS — E1129 Type 2 diabetes mellitus with other diabetic kidney complication: Secondary | ICD-10-CM | POA: Diagnosis not present

## 2019-06-19 ENCOUNTER — Other Ambulatory Visit (HOSPITAL_COMMUNITY)
Admission: RE | Admit: 2019-06-19 | Discharge: 2019-06-19 | Disposition: A | Discharge status: Home / Self Care | Payer: Medicare Other | Source: Ambulatory Visit | Attending: Vascular Surgery | Admitting: Vascular Surgery

## 2019-06-19 DIAGNOSIS — Z01812 Encounter for preprocedural laboratory examination: Secondary | ICD-10-CM | POA: Diagnosis not present

## 2019-06-19 DIAGNOSIS — Z20828 Contact with and (suspected) exposure to other viral communicable diseases: Secondary | ICD-10-CM | POA: Insufficient documentation

## 2019-06-20 LAB — NOVEL CORONAVIRUS, NAA (HOSP ORDER, SEND-OUT TO REF LAB; TAT 18-24 HRS): SARS-CoV-2, NAA: NOT DETECTED

## 2019-06-21 ENCOUNTER — Other Ambulatory Visit: Payer: Self-pay

## 2019-06-21 ENCOUNTER — Encounter (HOSPITAL_COMMUNITY): Payer: Self-pay | Admitting: *Deleted

## 2019-06-21 NOTE — Progress Notes (Signed)
Pt spouse Leane Platt denies that pt C/O SOB and chest pain.  Spouse denies that pt is under the care of a cardiologist but  stated that pt PCP is Dr. Alan Mulder. Spouse denies that pt had a cardiac cath. Spouse denies that pt had a chest x ray in the last year. Spouse made aware to have pt stop taking  vitamins, fish oil and herbal medications. Do not take any NSAIDs ie: Ibuprofen, Advil, Naproxen (Aleve), Motrin, BC and Goody Powder. Spouse made aware to have pt check CBG every 2 hours prior to arrival to hospital on DOS. Spouse made aware to treat a BG < 70 with  4 ounces of apple or cranberry juice, wait 15 minutes after intervention to recheck BG, if BG remains < 70, call Short Stay unit to speak with a nurse. Spouse verbalized understanding of all pre-op instructions.

## 2019-06-22 ENCOUNTER — Encounter (HOSPITAL_COMMUNITY)
Admission: RE | Disposition: A | Discharge status: Home / Self Care | Payer: Self-pay | Source: Home / Self Care | Attending: Vascular Surgery

## 2019-06-22 ENCOUNTER — Other Ambulatory Visit: Payer: Self-pay

## 2019-06-22 ENCOUNTER — Encounter (HOSPITAL_COMMUNITY): Payer: Self-pay | Admitting: *Deleted

## 2019-06-22 ENCOUNTER — Ambulatory Visit (HOSPITAL_COMMUNITY): Payer: Medicare Other | Admitting: Anesthesiology

## 2019-06-22 ENCOUNTER — Ambulatory Visit (HOSPITAL_COMMUNITY)
Admission: RE | Admit: 2019-06-22 | Discharge: 2019-06-22 | Disposition: A | Discharge status: Home / Self Care | Payer: Medicare Other | Attending: Vascular Surgery | Admitting: Vascular Surgery

## 2019-06-22 DIAGNOSIS — Z992 Dependence on renal dialysis: Secondary | ICD-10-CM | POA: Diagnosis not present

## 2019-06-22 DIAGNOSIS — E1122 Type 2 diabetes mellitus with diabetic chronic kidney disease: Secondary | ICD-10-CM | POA: Insufficient documentation

## 2019-06-22 DIAGNOSIS — N186 End stage renal disease: Secondary | ICD-10-CM | POA: Diagnosis not present

## 2019-06-22 DIAGNOSIS — Y832 Surgical operation with anastomosis, bypass or graft as the cause of abnormal reaction of the patient, or of later complication, without mention of misadventure at the time of the procedure: Secondary | ICD-10-CM | POA: Insufficient documentation

## 2019-06-22 DIAGNOSIS — I120 Hypertensive chronic kidney disease with stage 5 chronic kidney disease or end stage renal disease: Secondary | ICD-10-CM | POA: Diagnosis not present

## 2019-06-22 DIAGNOSIS — Z79899 Other long term (current) drug therapy: Secondary | ICD-10-CM | POA: Insufficient documentation

## 2019-06-22 DIAGNOSIS — T82898A Other specified complication of vascular prosthetic devices, implants and grafts, initial encounter: Secondary | ICD-10-CM | POA: Diagnosis not present

## 2019-06-22 HISTORY — PX: FISTULA SUPERFICIALIZATION: SHX6341

## 2019-06-22 LAB — GLUCOSE, CAPILLARY
Glucose-Capillary: 85 mg/dL (ref 70–99)
Glucose-Capillary: 75 mg/dL (ref 70–99)
Glucose-Capillary: 97 mg/dL (ref 70–99)

## 2019-06-22 LAB — POCT I-STAT, CHEM 8
BUN: 113 mg/dL — ABNORMAL HIGH (ref 6–20)
Calcium, Ion: 0.98 mmol/L — ABNORMAL LOW (ref 1.15–1.40)
Chloride: 97 mmol/L — ABNORMAL LOW (ref 98–111)
Creatinine, Ser: >18 mg/dL — ABNORMAL HIGH (ref 0.61–1.24)
Glucose, Bld: 98 mg/dL (ref 70–99)
HCT: 25 % — ABNORMAL LOW (ref 39.0–52.0)
Hemoglobin: 8.5 g/dL — ABNORMAL LOW (ref 13.0–17.0)
Potassium: 5 mmol/L (ref 3.5–5.1)
Sodium: 137 mmol/L (ref 135–145)
TCO2: 26 mmol/L (ref 22–32)

## 2019-06-22 SURGERY — FISTULA SUPERFICIALIZATION
Anesthesia: General | Site: Arm Upper | Laterality: Left

## 2019-06-22 MED ORDER — SODIUM CHLORIDE 0.9 % IV SOLN
INTRAVENOUS | Status: DC | PRN
  Administered 2019-06-22: 500 mL

## 2019-06-22 MED ORDER — HEPARIN SODIUM (PORCINE) 1000 UNIT/ML IJ SOLN
INTRAMUSCULAR | Status: DC | PRN
  Administered 2019-06-22: 10000 [IU] via INTRAVENOUS

## 2019-06-22 MED ORDER — LIDOCAINE-EPINEPHRINE (PF) 1 %-1:200000 IJ SOLN
INTRAMUSCULAR | Status: AC
  Filled 2019-06-22: qty 30

## 2019-06-22 MED ORDER — PROPOFOL 10 MG/ML IV BOLUS
INTRAVENOUS | Status: AC
  Filled 2019-06-22: qty 20

## 2019-06-22 MED ORDER — FENTANYL CITRATE (PF) 250 MCG/5ML IJ SOLN
INTRAMUSCULAR | Status: AC
  Filled 2019-06-22: qty 5

## 2019-06-22 MED ORDER — HYDROCODONE-ACETAMINOPHEN 5-325 MG PO TABS: 1.0000 | ORAL_TABLET | Freq: Once | ORAL | Status: DC

## 2019-06-22 MED ORDER — ONDANSETRON HCL 4 MG/2ML IJ SOLN
4.0000 mg | Freq: Once | INTRAMUSCULAR | Status: AC
  Administered 2019-06-22: 4 mg via INTRAVENOUS

## 2019-06-22 MED ORDER — ONDANSETRON HCL 4 MG/2ML IJ SOLN
INTRAMUSCULAR | Status: AC
  Filled 2019-06-22: qty 2

## 2019-06-22 MED ORDER — CEFAZOLIN SODIUM-DEXTROSE 2-3 GM-%(50ML) IV SOLR
INTRAVENOUS | Status: DC | PRN
  Administered 2019-06-22: 2 g via INTRAVENOUS

## 2019-06-22 MED ORDER — FENTANYL CITRATE (PF) 100 MCG/2ML IJ SOLN
INTRAMUSCULAR | Status: DC | PRN
  Administered 2019-06-22 (×4): 25 ug via INTRAVENOUS
  Administered 2019-06-22: 50 ug via INTRAVENOUS

## 2019-06-22 MED ORDER — PROMETHAZINE HCL 25 MG/ML IJ SOLN
INTRAMUSCULAR | Status: AC
  Filled 2019-06-22: qty 1

## 2019-06-22 MED ORDER — CEFAZOLIN SODIUM-DEXTROSE 2-4 GM/100ML-% IV SOLN
INTRAVENOUS | Status: AC
  Filled 2019-06-22: qty 100

## 2019-06-22 MED ORDER — ONDANSETRON HCL 4 MG/2ML IJ SOLN: 4.0000 mg | Freq: Once | INTRAMUSCULAR | Status: DC

## 2019-06-22 MED ORDER — SODIUM CHLORIDE 0.9 % IV SOLN
INTRAVENOUS | Status: DC
  Administered 2019-06-22 (×2): via INTRAVENOUS

## 2019-06-22 MED ORDER — PROMETHAZINE HCL 25 MG/ML IJ SOLN
6.2500 mg | Freq: Four times a day (QID) | INTRAMUSCULAR | Status: DC | PRN
  Administered 2019-06-22: 6.25 mg via INTRAVENOUS

## 2019-06-22 MED ORDER — LIDOCAINE HCL (CARDIAC) PF 100 MG/5ML IV SOSY
PREFILLED_SYRINGE | INTRAVENOUS | Status: DC | PRN
  Administered 2019-06-22: 100 mg via INTRAVENOUS

## 2019-06-22 MED ORDER — LIDOCAINE HCL (PF) 1 % IJ SOLN
INTRAMUSCULAR | Status: AC
  Filled 2019-06-22: qty 30

## 2019-06-22 MED ORDER — PROTAMINE SULFATE 10 MG/ML IV SOLN
INTRAVENOUS | Status: DC | PRN
  Administered 2019-06-22 (×7): 10 mg via INTRAVENOUS

## 2019-06-22 MED ORDER — 0.9 % SODIUM CHLORIDE (POUR BTL) OPTIME
TOPICAL | Status: DC | PRN
  Administered 2019-06-22: 1000 mL

## 2019-06-22 MED ORDER — ONDANSETRON HCL 4 MG/2ML IJ SOLN
INTRAMUSCULAR | Status: DC | PRN
  Administered 2019-06-22: 4 mg via INTRAVENOUS

## 2019-06-22 MED ORDER — HYDROCODONE-ACETAMINOPHEN 5-325 MG PO TABS: 1.0000 | ORAL_TABLET | Freq: Four times a day (QID) | ORAL | 0 refills | Status: DC | PRN

## 2019-06-22 MED ORDER — LIDOCAINE-EPINEPHRINE (PF) 1 %-1:200000 IJ SOLN
INTRAMUSCULAR | Status: DC | PRN
  Administered 2019-06-22: 21 mL

## 2019-06-22 MED ORDER — SODIUM CHLORIDE 0.9 % IV SOLN
INTRAVENOUS | Status: AC
  Filled 2019-06-22: qty 1.2

## 2019-06-22 MED ORDER — PROPOFOL 10 MG/ML IV BOLUS
INTRAVENOUS | Status: DC | PRN
  Administered 2019-06-22: 200 mg via INTRAVENOUS

## 2019-06-22 MED ORDER — SODIUM CHLORIDE 0.9 % IV SOLN
INTRAVENOUS | Status: DC | PRN
  Administered 2019-06-22: 13:00:00 25 ug/min via INTRAVENOUS

## 2019-06-22 SURGICAL SUPPLY — 45 items
ARMBAND PINK RESTRICT EXTREMIT (MISCELLANEOUS) ×3 IMPLANT
BNDG ELASTIC 4X5.8 VLCR STR LF (GAUZE/BANDAGES/DRESSINGS) ×2 IMPLANT
BNDG ESMARK 4X9 LF (GAUZE/BANDAGES/DRESSINGS) ×2 IMPLANT
BNDG GAUZE ELAST 4 BULKY (GAUZE/BANDAGES/DRESSINGS) ×2 IMPLANT
CANISTER SUCT 3000ML PPV (MISCELLANEOUS) ×3 IMPLANT
CANNULA VESSEL 3MM 2 BLNT TIP (CANNULA) ×3 IMPLANT
CLIP VESOCCLUDE MED 6/CT (CLIP) ×3 IMPLANT
CLIP VESOCCLUDE SM WIDE 6/CT (CLIP) ×3 IMPLANT
COVER PROBE W GEL 5X96 (DRAPES) ×3 IMPLANT
COVER WAND RF STERILE (DRAPES) ×3 IMPLANT
CUFF TOURN SGL QUICK 24 (TOURNIQUET CUFF) ×2
CUFF TRNQT CYL 24X4X16.5-23 (TOURNIQUET CUFF) IMPLANT
DECANTER SPIKE VIAL GLASS SM (MISCELLANEOUS) ×3 IMPLANT
DERMABOND ADVANCED (GAUZE/BANDAGES/DRESSINGS) ×2
DERMABOND ADVANCED .7 DNX12 (GAUZE/BANDAGES/DRESSINGS) ×1 IMPLANT
ELECT REM PT RETURN 9FT ADLT (ELECTROSURGICAL) ×3
ELECTRODE REM PT RTRN 9FT ADLT (ELECTROSURGICAL) ×1 IMPLANT
GAUZE SPONGE 4X4 12PLY STRL (GAUZE/BANDAGES/DRESSINGS) ×2 IMPLANT
GLOVE BIO SURGEON STRL SZ7.5 (GLOVE) ×3 IMPLANT
GLOVE BIO SURGEON STRL SZ8 (GLOVE) ×2 IMPLANT
GLOVE BIOGEL M 8.0 STRL (GLOVE) ×2 IMPLANT
GLOVE BIOGEL PI IND STRL 7.0 (GLOVE) IMPLANT
GLOVE BIOGEL PI IND STRL 7.5 (GLOVE) IMPLANT
GLOVE BIOGEL PI IND STRL 8 (GLOVE) ×1 IMPLANT
GLOVE BIOGEL PI INDICATOR 7.0 (GLOVE) ×2
GLOVE BIOGEL PI INDICATOR 7.5 (GLOVE) ×2
GLOVE BIOGEL PI INDICATOR 8 (GLOVE) ×4
GOWN STRL REUS W/ TWL LRG LVL3 (GOWN DISPOSABLE) ×3 IMPLANT
GOWN STRL REUS W/TWL LRG LVL3 (GOWN DISPOSABLE) ×6
KIT BASIN OR (CUSTOM PROCEDURE TRAY) ×3 IMPLANT
KIT TURNOVER KIT B (KITS) ×3 IMPLANT
NS IRRIG 1000ML POUR BTL (IV SOLUTION) ×3 IMPLANT
PACK CV ACCESS (CUSTOM PROCEDURE TRAY) ×3 IMPLANT
PAD ARMBOARD 7.5X6 YLW CONV (MISCELLANEOUS) ×6 IMPLANT
PAD CAST 4YDX4 CTTN HI CHSV (CAST SUPPLIES) IMPLANT
PADDING CAST COTTON 4X4 STRL (CAST SUPPLIES) ×2
SPONGE SURGIFOAM ABS GEL 100 (HEMOSTASIS) IMPLANT
SUT PROLENE 5 0 C 1 24 (SUTURE) ×10 IMPLANT
SUT PROLENE 6 0 BV (SUTURE) ×3 IMPLANT
SUT VIC AB 3-0 SH 27 (SUTURE) ×6
SUT VIC AB 3-0 SH 27X BRD (SUTURE) ×1 IMPLANT
SUT VICRYL 4-0 PS2 18IN ABS (SUTURE) ×3 IMPLANT
TOWEL GREEN STERILE (TOWEL DISPOSABLE) ×3 IMPLANT
UNDERPAD 30X30 (UNDERPADS AND DIAPERS) ×3 IMPLANT
WATER STERILE IRR 1000ML POUR (IV SOLUTION) ×3 IMPLANT

## 2019-06-22 NOTE — Anesthesia Postprocedure Evaluation (Signed)
Anesthesia Post Note  Patient: Patrick Ibarra  Procedure(s) Performed: 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 Primary repair. (Left Arm Upper)     Patient location during evaluation: PACU Anesthesia Type: General Level of consciousness: awake and alert Pain management: pain level controlled Vital Signs Assessment: post-procedure vital signs reviewed and stable Respiratory status: spontaneous breathing, nonlabored ventilation, respiratory function stable and patient connected to nasal cannula oxygen Cardiovascular status: blood pressure returned to baseline and stable Postop Assessment: no apparent nausea or vomiting Anesthetic complications: no    Last Vitals:  Vitals:   06/22/19 1530 06/22/19 1545  BP: (!) 160/101 (!) 164/102  Pulse: 83 81  Resp: 16 15  Temp:    SpO2: 97% 97%    Last Pain:  Vitals:   06/22/19 1545  TempSrc:   PainSc: [place] DAVID

## 2019-06-22 NOTE — Interval H&P Note (Signed)
History and Physical Interval Note:  06/22/2019 11:50 AM  Patrick Ibarra  has presented today for surgery, with the diagnosis of COMPLICATION OF ARTERIOVENOUS FISTULA LEFT ARM.  The various methods of treatment have been discussed with the patient and family. After consideration of risks, benefits and other options for treatment, the patient has consented to  Procedure(s): PLICATION OF ARTERIOVENOUS FISTULA LEFT ARM (Left) as a surgical intervention.  [name] history has been reviewed, patient examined, no change in status, stable for surgery.  I have reviewed the patient's chart and labs.  Questions were answered to the patient's satisfaction.     Deitra Mayo

## 2019-06-22 NOTE — Discharge Instructions (Signed)
° °  [facility] ° °Discharge Instructions ° °AV Fistula or Graft Surgery for Dialysis Access ° °Please refer to the following instructions for your post-procedure care. Your surgeon or physician assistant will discuss any changes with you. ° °Activity ° °You may drive the day following your surgery, if you are comfortable and no longer taking prescription pain medication. Resume full activity as the soreness in your incision resolves. ° °Bathing/Showering ° °You may shower after you go home. Keep your incision dry for 48 hours. Do not soak in a bathtub, hot tub, or swim until the incision heals completely. You may not shower if you have a hemodialysis catheter. ° °Incision Care ° °Clean your incision with mild soap and water after 48 hours. Pat the area dry with a clean towel. You do not need a bandage unless otherwise instructed. Do not apply any ointments or creams to your incision. You may have skin glue on your incision. Do not peel it off. It will come off on its own in about one week. Your arm may swell a bit after surgery. To reduce swelling use pillows to elevate your arm so it is above your heart. Your doctor will tell you if you need to lightly wrap your arm with an ACE bandage. ° °Diet ° °Resume your normal diet. There are not special food restrictions following this procedure. In order to heal from your surgery, it is CRITICAL to get adequate nutrition. Your body requires vitamins, minerals, and protein. Vegetables are the best source of vitamins and minerals. Vegetables also provide the perfect balance of protein. Processed food has little nutritional value, so try to avoid this. ° °Medications ° °Resume taking all of your medications. If your incision is causing pain, you may take over-the counter pain relievers such as acetaminophen (Tylenol). If you were prescribed a stronger pain medication, please be aware these medications can cause nausea and constipation. Prevent  nausea by taking the medication with a snack or meal. Avoid constipation by drinking plenty of fluids and eating foods with high amount of fiber, such as fruits, vegetables, and grains. Do not take Tylenol if you are taking prescription pain medications. ° ° ° ° °Follow up °Your surgeon may want to see you in the office following your access surgery. If so, this will be arranged at the time of your surgery. ° °Please call us immediately for any of the following conditions: ° °Increased pain, redness, drainage (pus) from your incision site °Fever of 101 degrees or higher °Severe or worsening pain at your incision site °Hand pain or numbness. ° °Reduce your risk of vascular disease: ° °Stop smoking. If you would like help, call [facility] at 1-800-QUIT-NOW (1-800-784-8669) or [facility] at 336-586-4000 ° °Manage your cholesterol °Maintain a desired weight °Control your diabetes °Keep your blood pressure down ° °Dialysis ° °It will take several weeks to several months for your new dialysis access to be ready for use. Your surgeon will determine when it is OK to use it. Your nephrologist will continue to direct your dialysis. You can continue to use your Permcath until your new access is ready for use. ° °If you have any questions, please call the office at [phone]. ° °

## 2019-06-22 NOTE — Op Note (Signed)
    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 Scot Dock, MD, FACS  ASSIST: Arlee Muslim, PA  ANESTHESIA: General  EBL: Minimal  INDICATIONS:    [name] is a 38 y.o. male with an aneurysmal left upper arm fistula.  He had 2 aneurysms 1 of which had a significant ulceration overlying it it was felt to be at risk for bleeding.  He was scheduled for plication of this.  FINDINGS:   The fistula was markedly degenerative and aneurysmal it was also quite tortuous.  Ultimately the only way to salvage the fistula was to excise the aneurysm and so the vein back into and.  It would be difficult to revise this fistula much more and I have recommended he be evaluated for new access.  TECHNIQUE:   The patient was taken to the operating room and received a general anesthetic.  The left arm was prepped and draped in usual sterile fashion.  An elliptical incision was made encompassing this large aneurysm.  The wall of the aneurysm was very thin making difficult dissection.  It was difficult to delineate where the true fistula actually was.  Ultimately I felt the safest approach was to apply a tourniquet.  The patient was heparinized.  Tourniquet was placed on the upper arm.  The arm was exsanguinated with an Esmarch bandage and the tourniquet inflated to 250 mmHg.  Under tourniquet control I open the aneurysm at that point could identify the fistula in the depths of the wound.  The aneurysm was excised and I was able to mobilize the vein proximally and distally after dividing it so that I could sew the fistula back into and with running 5-0 Prolene suture.  At the completion there was a good thrill in the fistula all the  fistula was pulsatile which was the same that it was preop.  It was difficult to close over the remaining fistula given the scar tissue and that the fistula was quite degenerative along the lateral aspect of this incision.  However I was able to close over the fistula with a deep layer of 3-0 Vicryl and the skin closed closed with 4-0 Vicryl.  Dermabond was applied.  The patient tolerated the procedure well was transferred to recovery room in stable condition.  All needle and sponge counts were correct.  Deitra Mayo, MD, FACS [facility]  DATE OF DICTATION:   06/22/2019

## 2019-06-22 NOTE — Anesthesia Procedure Notes (Signed)
Procedure Name: LMA Insertion Date/Time: 06/22/2019 1:23 AM Performed by: Eligha Bridegroom, CRNA Pre-anesthesia Checklist: Patient identified, Emergency Drugs available, Suction available, Patient being monitored and Timeout performed Patient Re-evaluated:Patient Re-evaluated prior to induction Oxygen Delivery Method: Circle system utilized Preoxygenation: Pre-oxygenation with 100% oxygen Induction Type: IV induction LMA: LMA inserted LMA Size: 5.0 Placement Confirmation: positive ETCO2 and breath sounds checked- equal and bilateral Tube secured with: Tape Dental Injury: Teeth and Oropharynx as per pre-operative assessment

## 2019-06-22 NOTE — Transfer of Care (Signed)
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 (Left Arm Upper)  Patient Location: PACU  Anesthesia Type:General  Level of Consciousness: awake and alert   Airway & Oxygen Therapy: Patient Spontanous Breathing  Post-op Assessment: Report given to RN and Post -op Vital signs reviewed and stable  Post vital signs: Reviewed and stable  Last Vitals:  Vitals Value Taken Time  BP 182/102 06/22/19 1446  Temp    Pulse 105 06/22/19 1452  Resp 19 06/22/19 1452  SpO2 97 % 06/22/19 1452  Vitals shown include unvalidated device data.  Last Pain:  Vitals:   06/22/19 1035  TempSrc:   PainSc: 0-No pain      Patients Stated Pain Goal: 3 ([id number])  Complications: No apparent anesthesia complications

## 2019-06-22 NOTE — Anesthesia Preprocedure Evaluation (Signed)
Anesthesia Evaluation  Patient identified by MRN, date of birth, ID band Patient awake    Reviewed: Allergy & Precautions, NPO status , Patient's Chart, lab work & pertinent test results  Airway Mallampati: I  TM Distance: >3 FB Neck ROM: Full    Dental   Pulmonary    Pulmonary exam normal        Cardiovascular hypertension, Pt. on medications Normal cardiovascular exam     Neuro/Psych    GI/Hepatic   Endo/Other  diabetes  Renal/GU Dialysis and ESRFRenal disease     Musculoskeletal   Abdominal   Peds  Hematology   Anesthesia Other Findings   Reproductive/Obstetrics                             Anesthesia Physical Anesthesia Plan  ASA: III  Anesthesia Plan: General   Post-op Pain Management:    Induction: Intravenous  PONV Risk Score and Plan: 2 and Ondansetron and Midazolam  Airway Management Planned: LMA  Additional Equipment:   Intra-op Plan:   Post-operative Plan: Extubation in OR  Informed Consent: I have reviewed the patients History and Physical, chart, labs and discussed the procedure including the risks, benefits and alternatives for the proposed anesthesia with the patient or authorized representative who has indicated his/her understanding and acceptance.       Plan Discussed with: CRNA and Surgeon  Anesthesia Plan Comments:         Anesthesia Quick Evaluation

## 2019-06-23 ENCOUNTER — Encounter (HOSPITAL_COMMUNITY): Payer: Self-pay | Admitting: Vascular Surgery

## 2019-06-25 ENCOUNTER — Other Ambulatory Visit: Payer: Self-pay

## 2019-06-25 DIAGNOSIS — N186 End stage renal disease: Secondary | ICD-10-CM

## 2019-06-28 ENCOUNTER — Telehealth (HOSPITAL_COMMUNITY): Payer: Self-pay

## 2019-06-28 ENCOUNTER — Telehealth: Payer: Self-pay | Admitting: Internal Medicine

## 2019-06-28 DIAGNOSIS — E119 Type 2 diabetes mellitus without complications: Secondary | ICD-10-CM

## 2019-06-28 NOTE — Telephone Encounter (Signed)

## 2019-06-28 NOTE — Telephone Encounter (Signed)
Patients wife called in regards to patients medications, states insurance Medicare and medicaid do not cover his current  -Accu-Chek FastClix Lancets MISC  -Blood Glucose Monitoring Suppl (ACCU-CHEK GUIDE ME) w/Device KIT  -glucose blood (ACCU-CHEK GUIDE) test strip  Please follow up to -[place], [place]

## 2019-06-28 NOTE — Telephone Encounter (Signed)
Spoke to the pharmacist at patient's pharmacy. They need information from patient's Medicare card. I called the patient and instructed him to bring his Medicare card to the pharmacy.

## 2019-06-29 ENCOUNTER — Other Ambulatory Visit: Payer: Self-pay

## 2019-06-29 ENCOUNTER — Ambulatory Visit (HOSPITAL_BASED_OUTPATIENT_CLINIC_OR_DEPARTMENT_OTHER): Payer: Medicare Other | Admitting: Pharmacist

## 2019-06-29 ENCOUNTER — Ambulatory Visit (INDEPENDENT_AMBULATORY_CARE_PROVIDER_SITE_OTHER): Payer: Medicare Other | Admitting: Vascular Surgery

## 2019-06-29 ENCOUNTER — Encounter: Payer: Self-pay | Admitting: Vascular Surgery

## 2019-06-29 ENCOUNTER — Ambulatory Visit (HOSPITAL_COMMUNITY)
Admission: RE | Admit: 2019-06-29 | Discharge: 2019-06-29 | Disposition: A | Discharge status: Home / Self Care | Payer: Medicare Other | Source: Ambulatory Visit | Attending: Family | Admitting: Family

## 2019-06-29 VITALS — BP 162/99 | HR 64 | Temp 98.4°F | Resp 18 | Ht 73.0 in | Wt 257.0 lb

## 2019-06-29 DIAGNOSIS — N186 End stage renal disease: Secondary | ICD-10-CM | POA: Diagnosis not present

## 2019-06-29 DIAGNOSIS — I10 Essential (primary) hypertension: Secondary | ICD-10-CM

## 2019-06-29 NOTE — Progress Notes (Signed)
Patient name: Patrick Ibarra MRN: [id number] DOB: July 01, 1981 Sex: male  REASON FOR VISIT: Evaluate for new AV fistula access  HPI: Patrick Ibarra is a 38 y.o. male with end-stage renal disease secondary to hypertension who presents for new AV fistula access evaluation.  Patient is well-known to the vascular surgery service and previously had a left brachiocephalic AV fistula placed by Dr. Bridgett Larsson 04/10/2017.  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 Scot Dock.  His notes indicate he did not think that the fistula was going to be salvageable long-term and the patient had to be evaluated for new access.  Patient comes in today and has never had any right arm access before.  He is right-handed.  No catheters at this time.  States he has not use the fistula since last Tuesday and has not dialyzed in the last week and did not feel like going.  States he is not sure he wants to continue dialysis.  Past Medical History:  Diagnosis Date  . Chronic kidney disease    dialysis M-W-F  . Diabetes mellitus without complication ([place])    type 2  . Hypertension   . LV dysfunction   . Obesity     Past Surgical History:  Procedure Laterality Date  . AV FISTULA PLACEMENT Left 04/10/2017   Procedure: ARTERIOVENOUS (AV) FISTULA CREATION LEFT ARM;  Surgeon: Conrad [place], MD;  Location: [place];  Service: Vascular;  Laterality: Left;  . ESOPHAGOGASTRODUODENOSCOPY (EGD) WITH PROPOFOL N/A 04/04/2017   Procedure: ESOPHAGOGASTRODUODENOSCOPY (EGD) WITH PROPOFOL;  Surgeon: Carol Ada, MD;  Location: [place];  Service: Endoscopy;  Laterality: N/A;  . 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 INSERTION OF DIALYSIS CATHETER Right 04/10/2017   Procedure: INSERTION  OF DIALYSIS CATHETER - RIGHT INTERNAL JUGULAR PLACEMENT;  Surgeon: Conrad Granville, MD;  Location: [place];  Service: Vascular;  Laterality: Right;  . TONSILLECTOMY AND ADENOIDECTOMY      Family History  Problem Relation Age of Onset  . Hypertension Mother   . Diabetes Mother   . Hypertension Father     SOCIAL HISTORY: Social History   Tobacco Use  . Smoking status: Never Smoker  . Smokeless tobacco: Never Used  Substance Use Topics  . Alcohol use: Not Currently    No Known Allergies  Current Outpatient Medications  Medication Sig Dispense Refill  . Accu-Chek FastClix Lancets MISC Use as instructed to check blood sugar once daily. E11.9 102 each 11  . amLODipine (NORVASC) 10 MG tablet Take 1 tablet (10 mg total) by mouth daily. 30 tablet 5  . Blood Glucose Monitoring Suppl (ACCU-CHEK GUIDE ME) w/Device KIT 1 kit by Does not apply route daily. Use as instructed to check blood sugar once daily. E11.9 1 kit 0  . carvedilol (COREG) 3.125 MG tablet TAKE 1 TABLET (3.125 MG TOTAL) BY MOUTH 2 (TWO) TIMES DAILY WITH A MEAL. 60 tablet 2  . glucose blood (ACCU-CHEK GUIDE) test strip Use as instructed to check blood sugar once daily. E11.9 100 each 12  . HYDROcodone-acetaminophen (NORCO) 5-325 MG tablet Take 1 tablet by mouth every 6 (six) hours as needed for moderate pain. 15 tablet 0  . lovastatin (MEVACOR) 20 MG tablet Take 1 tablet (20  mg total) by mouth at bedtime. 30 tablet 3  . Magnesium Sulfate, Laxative, (EPSOM SALT PO) Apply 1 application topically as needed (foot pain.). Epsom Salts Cream    . sevelamer carbonate (RENVELA) 800 MG tablet Take 2,400-3,200 mg by mouth See admin instructions. Take 4 tablets (3200 mg) by mouth with each meal & take 3 tablets (2400 mg) by mouth with each snack     No current facility-administered medications for this visit.     REVIEW OF SYSTEMS:  _0  denotes positive finding, _1  denotes negative finding Cardiac  Comments:  Chest pain or chest  pressure:    Shortness of breath upon exertion:    Short of breath when lying flat:    Irregular heart rhythm:        Vascular    Pain in calf, thigh, or hip brought on by ambulation:    Pain in feet at night that wakes you up from your sleep:     Blood clot in your veins:    Leg swelling:         Pulmonary    Oxygen at home:    Productive cough:     Wheezing:         Neurologic    Sudden weakness in arms or legs:     Sudden numbness in arms or legs:     Sudden onset of difficulty speaking or slurred speech:    Temporary loss of vision in one eye:     Problems with dizziness:         Gastrointestinal    Blood in stool:     Vomited blood:         Genitourinary    Burning when urinating:     Blood in urine:        Psychiatric    Major depression:         Hematologic    Bleeding problems:    Problems with blood clotting too easily:        Skin    Rashes or ulcers:        Constitutional    Fever or chills:      PHYSICAL EXAM: Vitals:   06/29/19 1152  BP: (!) 162/99  Pulse: 64  Resp: 18  Temp: 98.4 F (36.9 C)  TempSrc: Temporal  SpO2: 99%  Weight: 257 lb (116.6 kg)  Height: _2  (1.854 m)    GENERAL: The patient is a well-nourished male, in no acute distress. The vital signs are documented above. CARDIAC: There is a regular rate and rhythm.  VASCULAR:  Left upper arm AV fistula with healing incision after ulcer resection.  Good thrill in the fistula. Left arm fistula appears very tortuous.  He has a second aneurysmal area in the upper arm separate from the aneurysm just repaired by Dr. Scot Dock. Right radial and brachial pulse palpable. PULMONARY: There is good air exchange bilaterally without wheezing or rales. ABDOMEN: Soft and non-tender with normal pitched bowel sounds.  MUSCULOSKELETAL: There are no major deformities or cyanosis. NEUROLOGIC: No focal weakness or paresthesias are detected. SKIN: There are no ulcers or rashes noted. PSYCHIATRIC:  The patient has a normal affect.  DATA:   Vein mapping shows usable right basilic vein but cephalic vein appears small.  Assessment/Plan:  38 year old male that is has end-stage renal disease due to hypertension.  Presents for new AV fistula access evaluation.  He has a left arm brachiocephalic AV fistula previously placed June 2018.  This was  recently repaired by Dr. Scot Dock last week for excision of a large aneurysm and primary repair.  Dr. Nicole Cella notes indicate he did not feel the fistula was salvageable long-term and he presents for new access evaluation in right arm.  Right arm vein mapping was obtained today that shows a usable basilic vein.  I discussed steps of basilic vein fistula access including typically this being done in two steps.  Patient is hesitant to sign up for surgery.  States he was hesitant to begin  dialysis initially.  States he would like to see how his left arm fistula works in dialysis first.  Hanley Seamen him my contact information if he changes his mind.  Discussed he needs to get dialyzed since no dialysis in one week.   Marty Heck, MD [facility] Office: [phone] Pager: [phone]

## 2019-06-29 NOTE — Progress Notes (Signed)
Patient never presented to the office but was checked in accidentally. I have reached out and scheduled him for Thursday.

## 2019-06-30 DIAGNOSIS — N2581 Secondary hyperparathyroidism of renal origin: Secondary | ICD-10-CM | POA: Diagnosis not present

## 2019-06-30 DIAGNOSIS — N186 End stage renal disease: Secondary | ICD-10-CM | POA: Diagnosis not present

## 2019-06-30 DIAGNOSIS — E1129 Type 2 diabetes mellitus with other diabetic kidney complication: Secondary | ICD-10-CM | POA: Diagnosis not present

## 2019-06-30 DIAGNOSIS — D631 Anemia in chronic kidney disease: Secondary | ICD-10-CM | POA: Diagnosis not present

## 2019-06-30 DIAGNOSIS — Z992 Dependence on renal dialysis: Secondary | ICD-10-CM | POA: Diagnosis not present

## 2019-06-30 DIAGNOSIS — D509 Iron deficiency anemia, unspecified: Secondary | ICD-10-CM | POA: Diagnosis not present

## 2019-07-01 ENCOUNTER — Ambulatory Visit: Payer: Medicare Other | Admitting: Pharmacist

## 2019-07-02 DIAGNOSIS — N186 End stage renal disease: Secondary | ICD-10-CM | POA: Diagnosis not present

## 2019-07-02 DIAGNOSIS — D631 Anemia in chronic kidney disease: Secondary | ICD-10-CM | POA: Diagnosis not present

## 2019-07-02 DIAGNOSIS — E1129 Type 2 diabetes mellitus with other diabetic kidney complication: Secondary | ICD-10-CM | POA: Diagnosis not present

## 2019-07-02 DIAGNOSIS — D509 Iron deficiency anemia, unspecified: Secondary | ICD-10-CM | POA: Diagnosis not present

## 2019-07-02 DIAGNOSIS — N2581 Secondary hyperparathyroidism of renal origin: Secondary | ICD-10-CM | POA: Diagnosis not present

## 2019-07-02 DIAGNOSIS — Z992 Dependence on renal dialysis: Secondary | ICD-10-CM | POA: Diagnosis not present

## 2019-07-04 DIAGNOSIS — N186 End stage renal disease: Secondary | ICD-10-CM | POA: Diagnosis not present

## 2019-07-04 DIAGNOSIS — Z992 Dependence on renal dialysis: Secondary | ICD-10-CM | POA: Diagnosis not present

## 2019-07-04 DIAGNOSIS — I129 Hypertensive chronic kidney disease with stage 1 through stage 4 chronic kidney disease, or unspecified chronic kidney disease: Secondary | ICD-10-CM | POA: Diagnosis not present

## 2019-07-05 DIAGNOSIS — E1129 Type 2 diabetes mellitus with other diabetic kidney complication: Secondary | ICD-10-CM | POA: Diagnosis not present

## 2019-07-05 DIAGNOSIS — Z992 Dependence on renal dialysis: Secondary | ICD-10-CM | POA: Diagnosis not present

## 2019-07-05 DIAGNOSIS — N186 End stage renal disease: Secondary | ICD-10-CM | POA: Diagnosis not present

## 2019-07-05 DIAGNOSIS — D509 Iron deficiency anemia, unspecified: Secondary | ICD-10-CM | POA: Diagnosis not present

## 2019-07-05 DIAGNOSIS — N2581 Secondary hyperparathyroidism of renal origin: Secondary | ICD-10-CM | POA: Diagnosis not present

## 2019-07-05 DIAGNOSIS — D631 Anemia in chronic kidney disease: Secondary | ICD-10-CM | POA: Diagnosis not present

## 2019-07-07 DIAGNOSIS — D631 Anemia in chronic kidney disease: Secondary | ICD-10-CM | POA: Diagnosis not present

## 2019-07-07 DIAGNOSIS — N2581 Secondary hyperparathyroidism of renal origin: Secondary | ICD-10-CM | POA: Diagnosis not present

## 2019-07-07 DIAGNOSIS — E1129 Type 2 diabetes mellitus with other diabetic kidney complication: Secondary | ICD-10-CM | POA: Diagnosis not present

## 2019-07-07 DIAGNOSIS — N186 End stage renal disease: Secondary | ICD-10-CM | POA: Diagnosis not present

## 2019-07-07 DIAGNOSIS — Z992 Dependence on renal dialysis: Secondary | ICD-10-CM | POA: Diagnosis not present

## 2019-07-07 DIAGNOSIS — D509 Iron deficiency anemia, unspecified: Secondary | ICD-10-CM | POA: Diagnosis not present

## 2019-07-09 DIAGNOSIS — E1129 Type 2 diabetes mellitus with other diabetic kidney complication: Secondary | ICD-10-CM | POA: Diagnosis not present

## 2019-07-09 DIAGNOSIS — D509 Iron deficiency anemia, unspecified: Secondary | ICD-10-CM | POA: Diagnosis not present

## 2019-07-09 DIAGNOSIS — Z992 Dependence on renal dialysis: Secondary | ICD-10-CM | POA: Diagnosis not present

## 2019-07-09 DIAGNOSIS — N2581 Secondary hyperparathyroidism of renal origin: Secondary | ICD-10-CM | POA: Diagnosis not present

## 2019-07-09 DIAGNOSIS — D631 Anemia in chronic kidney disease: Secondary | ICD-10-CM | POA: Diagnosis not present

## 2019-07-09 DIAGNOSIS — N186 End stage renal disease: Secondary | ICD-10-CM | POA: Diagnosis not present

## 2019-07-12 DIAGNOSIS — Z992 Dependence on renal dialysis: Secondary | ICD-10-CM | POA: Diagnosis not present

## 2019-07-12 DIAGNOSIS — N186 End stage renal disease: Secondary | ICD-10-CM | POA: Diagnosis not present

## 2019-07-12 DIAGNOSIS — N2581 Secondary hyperparathyroidism of renal origin: Secondary | ICD-10-CM | POA: Diagnosis not present

## 2019-07-12 DIAGNOSIS — D631 Anemia in chronic kidney disease: Secondary | ICD-10-CM | POA: Diagnosis not present

## 2019-07-12 DIAGNOSIS — E1129 Type 2 diabetes mellitus with other diabetic kidney complication: Secondary | ICD-10-CM | POA: Diagnosis not present

## 2019-07-12 DIAGNOSIS — D509 Iron deficiency anemia, unspecified: Secondary | ICD-10-CM | POA: Diagnosis not present

## 2019-07-14 DIAGNOSIS — E1129 Type 2 diabetes mellitus with other diabetic kidney complication: Secondary | ICD-10-CM | POA: Diagnosis not present

## 2019-07-14 DIAGNOSIS — N186 End stage renal disease: Secondary | ICD-10-CM | POA: Diagnosis not present

## 2019-07-14 DIAGNOSIS — Z992 Dependence on renal dialysis: Secondary | ICD-10-CM | POA: Diagnosis not present

## 2019-07-14 DIAGNOSIS — N2581 Secondary hyperparathyroidism of renal origin: Secondary | ICD-10-CM | POA: Diagnosis not present

## 2019-07-14 DIAGNOSIS — D509 Iron deficiency anemia, unspecified: Secondary | ICD-10-CM | POA: Diagnosis not present

## 2019-07-14 DIAGNOSIS — D631 Anemia in chronic kidney disease: Secondary | ICD-10-CM | POA: Diagnosis not present

## 2019-07-16 DIAGNOSIS — D509 Iron deficiency anemia, unspecified: Secondary | ICD-10-CM | POA: Diagnosis not present

## 2019-07-16 DIAGNOSIS — E1129 Type 2 diabetes mellitus with other diabetic kidney complication: Secondary | ICD-10-CM | POA: Diagnosis not present

## 2019-07-16 DIAGNOSIS — D631 Anemia in chronic kidney disease: Secondary | ICD-10-CM | POA: Diagnosis not present

## 2019-07-16 DIAGNOSIS — Z992 Dependence on renal dialysis: Secondary | ICD-10-CM | POA: Diagnosis not present

## 2019-07-16 DIAGNOSIS — N186 End stage renal disease: Secondary | ICD-10-CM | POA: Diagnosis not present

## 2019-07-16 DIAGNOSIS — N2581 Secondary hyperparathyroidism of renal origin: Secondary | ICD-10-CM | POA: Diagnosis not present

## 2019-07-21 DIAGNOSIS — D509 Iron deficiency anemia, unspecified: Secondary | ICD-10-CM | POA: Diagnosis not present

## 2019-07-21 DIAGNOSIS — D631 Anemia in chronic kidney disease: Secondary | ICD-10-CM | POA: Diagnosis not present

## 2019-07-21 DIAGNOSIS — E1129 Type 2 diabetes mellitus with other diabetic kidney complication: Secondary | ICD-10-CM | POA: Diagnosis not present

## 2019-07-21 DIAGNOSIS — N186 End stage renal disease: Secondary | ICD-10-CM | POA: Diagnosis not present

## 2019-07-21 DIAGNOSIS — N2581 Secondary hyperparathyroidism of renal origin: Secondary | ICD-10-CM | POA: Diagnosis not present

## 2019-07-21 DIAGNOSIS — Z992 Dependence on renal dialysis: Secondary | ICD-10-CM | POA: Diagnosis not present

## 2019-07-23 DIAGNOSIS — N186 End stage renal disease: Secondary | ICD-10-CM | POA: Diagnosis not present

## 2019-07-23 DIAGNOSIS — D631 Anemia in chronic kidney disease: Secondary | ICD-10-CM | POA: Diagnosis not present

## 2019-07-23 DIAGNOSIS — D509 Iron deficiency anemia, unspecified: Secondary | ICD-10-CM | POA: Diagnosis not present

## 2019-07-23 DIAGNOSIS — N2581 Secondary hyperparathyroidism of renal origin: Secondary | ICD-10-CM | POA: Diagnosis not present

## 2019-07-23 DIAGNOSIS — E1129 Type 2 diabetes mellitus with other diabetic kidney complication: Secondary | ICD-10-CM | POA: Diagnosis not present

## 2019-07-23 DIAGNOSIS — Z992 Dependence on renal dialysis: Secondary | ICD-10-CM | POA: Diagnosis not present

## 2019-07-27 DIAGNOSIS — E1129 Type 2 diabetes mellitus with other diabetic kidney complication: Secondary | ICD-10-CM | POA: Diagnosis not present

## 2019-07-27 DIAGNOSIS — N2581 Secondary hyperparathyroidism of renal origin: Secondary | ICD-10-CM | POA: Diagnosis not present

## 2019-07-27 DIAGNOSIS — D631 Anemia in chronic kidney disease: Secondary | ICD-10-CM | POA: Diagnosis not present

## 2019-07-27 DIAGNOSIS — Z992 Dependence on renal dialysis: Secondary | ICD-10-CM | POA: Diagnosis not present

## 2019-07-27 DIAGNOSIS — D509 Iron deficiency anemia, unspecified: Secondary | ICD-10-CM | POA: Diagnosis not present

## 2019-07-27 DIAGNOSIS — N186 End stage renal disease: Secondary | ICD-10-CM | POA: Diagnosis not present

## 2019-07-30 DIAGNOSIS — N186 End stage renal disease: Secondary | ICD-10-CM | POA: Diagnosis not present

## 2019-07-30 DIAGNOSIS — N2581 Secondary hyperparathyroidism of renal origin: Secondary | ICD-10-CM | POA: Diagnosis not present

## 2019-07-30 DIAGNOSIS — Z992 Dependence on renal dialysis: Secondary | ICD-10-CM | POA: Diagnosis not present

## 2019-07-30 DIAGNOSIS — D631 Anemia in chronic kidney disease: Secondary | ICD-10-CM | POA: Diagnosis not present

## 2019-07-30 DIAGNOSIS — D509 Iron deficiency anemia, unspecified: Secondary | ICD-10-CM | POA: Diagnosis not present

## 2019-07-30 DIAGNOSIS — E1129 Type 2 diabetes mellitus with other diabetic kidney complication: Secondary | ICD-10-CM | POA: Diagnosis not present

## 2019-08-02 DIAGNOSIS — N2581 Secondary hyperparathyroidism of renal origin: Secondary | ICD-10-CM | POA: Diagnosis not present

## 2019-08-02 DIAGNOSIS — D509 Iron deficiency anemia, unspecified: Secondary | ICD-10-CM | POA: Diagnosis not present

## 2019-08-02 DIAGNOSIS — E1129 Type 2 diabetes mellitus with other diabetic kidney complication: Secondary | ICD-10-CM | POA: Diagnosis not present

## 2019-08-02 DIAGNOSIS — N186 End stage renal disease: Secondary | ICD-10-CM | POA: Diagnosis not present

## 2019-08-02 DIAGNOSIS — Z992 Dependence on renal dialysis: Secondary | ICD-10-CM | POA: Diagnosis not present

## 2019-08-02 DIAGNOSIS — D631 Anemia in chronic kidney disease: Secondary | ICD-10-CM | POA: Diagnosis not present

## 2019-08-04 DIAGNOSIS — E1129 Type 2 diabetes mellitus with other diabetic kidney complication: Secondary | ICD-10-CM | POA: Diagnosis not present

## 2019-08-04 DIAGNOSIS — Z992 Dependence on renal dialysis: Secondary | ICD-10-CM | POA: Diagnosis not present

## 2019-08-04 DIAGNOSIS — D631 Anemia in chronic kidney disease: Secondary | ICD-10-CM | POA: Diagnosis not present

## 2019-08-04 DIAGNOSIS — N2581 Secondary hyperparathyroidism of renal origin: Secondary | ICD-10-CM | POA: Diagnosis not present

## 2019-08-04 DIAGNOSIS — D509 Iron deficiency anemia, unspecified: Secondary | ICD-10-CM | POA: Diagnosis not present

## 2019-08-04 DIAGNOSIS — N186 End stage renal disease: Secondary | ICD-10-CM | POA: Diagnosis not present

## 2019-08-06 DIAGNOSIS — N186 End stage renal disease: Secondary | ICD-10-CM | POA: Diagnosis not present

## 2019-08-06 DIAGNOSIS — D631 Anemia in chronic kidney disease: Secondary | ICD-10-CM | POA: Diagnosis not present

## 2019-08-06 DIAGNOSIS — D509 Iron deficiency anemia, unspecified: Secondary | ICD-10-CM | POA: Diagnosis not present

## 2019-08-06 DIAGNOSIS — N2581 Secondary hyperparathyroidism of renal origin: Secondary | ICD-10-CM | POA: Diagnosis not present

## 2019-08-06 DIAGNOSIS — E1129 Type 2 diabetes mellitus with other diabetic kidney complication: Secondary | ICD-10-CM | POA: Diagnosis not present

## 2019-08-06 DIAGNOSIS — Z992 Dependence on renal dialysis: Secondary | ICD-10-CM | POA: Diagnosis not present

## 2019-08-12 ENCOUNTER — Ambulatory Visit: Payer: Medicare Other | Admitting: Internal Medicine

## 2019-08-13 DIAGNOSIS — N186 End stage renal disease: Secondary | ICD-10-CM | POA: Diagnosis not present

## 2019-08-13 DIAGNOSIS — N2581 Secondary hyperparathyroidism of renal origin: Secondary | ICD-10-CM | POA: Diagnosis not present

## 2019-08-13 DIAGNOSIS — Z992 Dependence on renal dialysis: Secondary | ICD-10-CM | POA: Diagnosis not present

## 2019-08-13 DIAGNOSIS — E1129 Type 2 diabetes mellitus with other diabetic kidney complication: Secondary | ICD-10-CM | POA: Diagnosis not present

## 2019-08-13 DIAGNOSIS — D509 Iron deficiency anemia, unspecified: Secondary | ICD-10-CM | POA: Diagnosis not present

## 2019-08-13 DIAGNOSIS — D631 Anemia in chronic kidney disease: Secondary | ICD-10-CM | POA: Diagnosis not present

## 2019-08-18 DIAGNOSIS — D631 Anemia in chronic kidney disease: Secondary | ICD-10-CM | POA: Diagnosis not present

## 2019-08-18 DIAGNOSIS — E1129 Type 2 diabetes mellitus with other diabetic kidney complication: Secondary | ICD-10-CM | POA: Diagnosis not present

## 2019-08-18 DIAGNOSIS — D509 Iron deficiency anemia, unspecified: Secondary | ICD-10-CM | POA: Diagnosis not present

## 2019-08-18 DIAGNOSIS — Z992 Dependence on renal dialysis: Secondary | ICD-10-CM | POA: Diagnosis not present

## 2019-08-18 DIAGNOSIS — N186 End stage renal disease: Secondary | ICD-10-CM | POA: Diagnosis not present

## 2019-08-18 DIAGNOSIS — N2581 Secondary hyperparathyroidism of renal origin: Secondary | ICD-10-CM | POA: Diagnosis not present

## 2019-08-20 DIAGNOSIS — D509 Iron deficiency anemia, unspecified: Secondary | ICD-10-CM | POA: Diagnosis not present

## 2019-08-20 DIAGNOSIS — N2581 Secondary hyperparathyroidism of renal origin: Secondary | ICD-10-CM | POA: Diagnosis not present

## 2019-08-20 DIAGNOSIS — D631 Anemia in chronic kidney disease: Secondary | ICD-10-CM | POA: Diagnosis not present

## 2019-08-20 DIAGNOSIS — N186 End stage renal disease: Secondary | ICD-10-CM | POA: Diagnosis not present

## 2019-08-20 DIAGNOSIS — Z992 Dependence on renal dialysis: Secondary | ICD-10-CM | POA: Diagnosis not present

## 2019-08-20 DIAGNOSIS — E1129 Type 2 diabetes mellitus with other diabetic kidney complication: Secondary | ICD-10-CM | POA: Diagnosis not present

## 2019-08-23 DIAGNOSIS — Z992 Dependence on renal dialysis: Secondary | ICD-10-CM | POA: Diagnosis not present

## 2019-08-23 DIAGNOSIS — D631 Anemia in chronic kidney disease: Secondary | ICD-10-CM | POA: Diagnosis not present

## 2019-08-23 DIAGNOSIS — N2581 Secondary hyperparathyroidism of renal origin: Secondary | ICD-10-CM | POA: Diagnosis not present

## 2019-08-23 DIAGNOSIS — N186 End stage renal disease: Secondary | ICD-10-CM | POA: Diagnosis not present

## 2019-08-23 DIAGNOSIS — E1129 Type 2 diabetes mellitus with other diabetic kidney complication: Secondary | ICD-10-CM | POA: Diagnosis not present

## 2019-08-23 DIAGNOSIS — D509 Iron deficiency anemia, unspecified: Secondary | ICD-10-CM | POA: Diagnosis not present

## 2019-08-30 DIAGNOSIS — E1129 Type 2 diabetes mellitus with other diabetic kidney complication: Secondary | ICD-10-CM | POA: Diagnosis not present

## 2019-08-30 DIAGNOSIS — D631 Anemia in chronic kidney disease: Secondary | ICD-10-CM | POA: Diagnosis not present

## 2019-08-30 DIAGNOSIS — Z992 Dependence on renal dialysis: Secondary | ICD-10-CM | POA: Diagnosis not present

## 2019-08-30 DIAGNOSIS — N186 End stage renal disease: Secondary | ICD-10-CM | POA: Diagnosis not present

## 2019-08-30 DIAGNOSIS — D509 Iron deficiency anemia, unspecified: Secondary | ICD-10-CM | POA: Diagnosis not present

## 2019-08-30 DIAGNOSIS — N2581 Secondary hyperparathyroidism of renal origin: Secondary | ICD-10-CM | POA: Diagnosis not present

## 2019-08-30 MED FILL — AMLODIPINE BESYLATE 10 MG T: 10 | 30 days supply | Qty: 30 | Fill #1

## 2019-08-30 MED FILL — CARVEDILOL 6.25 MG TABLET: 6.25 | 30 days supply | Qty: 60 | Fill #0

## 2019-08-30 MED FILL — LOVASTATIN 20 MG TABS: 20 | 30 days supply | Qty: 30 | Fill #1

## 2019-08-31 ENCOUNTER — Ambulatory Visit: Payer: Medicare Other | Admitting: Podiatry

## 2019-09-01 DIAGNOSIS — D509 Iron deficiency anemia, unspecified: Secondary | ICD-10-CM | POA: Diagnosis not present

## 2019-09-01 DIAGNOSIS — Z992 Dependence on renal dialysis: Secondary | ICD-10-CM | POA: Diagnosis not present

## 2019-09-01 DIAGNOSIS — N2581 Secondary hyperparathyroidism of renal origin: Secondary | ICD-10-CM | POA: Diagnosis not present

## 2019-09-01 DIAGNOSIS — D631 Anemia in chronic kidney disease: Secondary | ICD-10-CM | POA: Diagnosis not present

## 2019-09-01 DIAGNOSIS — E1129 Type 2 diabetes mellitus with other diabetic kidney complication: Secondary | ICD-10-CM | POA: Diagnosis not present

## 2019-09-01 DIAGNOSIS — N186 End stage renal disease: Secondary | ICD-10-CM | POA: Diagnosis not present

## 2019-09-06 DIAGNOSIS — N186 End stage renal disease: Secondary | ICD-10-CM | POA: Diagnosis not present

## 2019-09-06 DIAGNOSIS — D631 Anemia in chronic kidney disease: Secondary | ICD-10-CM | POA: Diagnosis not present

## 2019-09-06 DIAGNOSIS — E1129 Type 2 diabetes mellitus with other diabetic kidney complication: Secondary | ICD-10-CM | POA: Diagnosis not present

## 2019-09-06 DIAGNOSIS — N2581 Secondary hyperparathyroidism of renal origin: Secondary | ICD-10-CM | POA: Diagnosis not present

## 2019-09-06 DIAGNOSIS — Z992 Dependence on renal dialysis: Secondary | ICD-10-CM | POA: Diagnosis not present

## 2019-09-06 DIAGNOSIS — D509 Iron deficiency anemia, unspecified: Secondary | ICD-10-CM | POA: Diagnosis not present

## 2019-09-08 DIAGNOSIS — N2581 Secondary hyperparathyroidism of renal origin: Secondary | ICD-10-CM | POA: Diagnosis not present

## 2019-09-08 DIAGNOSIS — D631 Anemia in chronic kidney disease: Secondary | ICD-10-CM | POA: Diagnosis not present

## 2019-09-08 DIAGNOSIS — Z992 Dependence on renal dialysis: Secondary | ICD-10-CM | POA: Diagnosis not present

## 2019-09-08 DIAGNOSIS — E1129 Type 2 diabetes mellitus with other diabetic kidney complication: Secondary | ICD-10-CM | POA: Diagnosis not present

## 2019-09-08 DIAGNOSIS — D509 Iron deficiency anemia, unspecified: Secondary | ICD-10-CM | POA: Diagnosis not present

## 2019-09-08 DIAGNOSIS — N186 End stage renal disease: Secondary | ICD-10-CM | POA: Diagnosis not present

## 2019-09-13 DIAGNOSIS — E1129 Type 2 diabetes mellitus with other diabetic kidney complication: Secondary | ICD-10-CM | POA: Diagnosis not present

## 2019-09-13 DIAGNOSIS — N2581 Secondary hyperparathyroidism of renal origin: Secondary | ICD-10-CM | POA: Diagnosis not present

## 2019-09-13 DIAGNOSIS — N186 End stage renal disease: Secondary | ICD-10-CM | POA: Diagnosis not present

## 2019-09-13 DIAGNOSIS — Z992 Dependence on renal dialysis: Secondary | ICD-10-CM | POA: Diagnosis not present

## 2019-09-13 DIAGNOSIS — D509 Iron deficiency anemia, unspecified: Secondary | ICD-10-CM | POA: Diagnosis not present

## 2019-09-13 DIAGNOSIS — D631 Anemia in chronic kidney disease: Secondary | ICD-10-CM | POA: Diagnosis not present

## 2019-09-15 ENCOUNTER — Telehealth (HOSPITAL_COMMUNITY): Payer: Self-pay

## 2019-09-15 DIAGNOSIS — D509 Iron deficiency anemia, unspecified: Secondary | ICD-10-CM | POA: Diagnosis not present

## 2019-09-15 DIAGNOSIS — N2581 Secondary hyperparathyroidism of renal origin: Secondary | ICD-10-CM | POA: Diagnosis not present

## 2019-09-15 DIAGNOSIS — E1129 Type 2 diabetes mellitus with other diabetic kidney complication: Secondary | ICD-10-CM | POA: Diagnosis not present

## 2019-09-15 DIAGNOSIS — Z992 Dependence on renal dialysis: Secondary | ICD-10-CM | POA: Diagnosis not present

## 2019-09-15 DIAGNOSIS — N186 End stage renal disease: Secondary | ICD-10-CM | POA: Diagnosis not present

## 2019-09-15 DIAGNOSIS — D631 Anemia in chronic kidney disease: Secondary | ICD-10-CM | POA: Diagnosis not present

## 2019-09-15 NOTE — Telephone Encounter (Signed)

## 2019-09-15 NOTE — Progress Notes (Signed)
HISTORY AND PHYSICAL     CC:  dialysis access Requesting Provider:  Ladell Pier, MD  HPI: This is a 39 y.o. male here for evaluation of his hemodialysis access.  Surgical history significant for left brachiocephalic AV fistula placed by Dr. Bridgett Larsson 04/10/2017.  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 Scot Dock.  His notes indicate he did not think that the fistula was going to be salvageable long-term and the patient had to be evaluated for new access.  He returns today with scabbing and thinning skin overlying a new aneurysm of L arm fistula.  Patient states they are avoiding this area during cannulation.  He is concerned about the appearance of the overlying skin.  He is now agreeable to proceed with new access in right arm.  Vein mapping performed in October of last year demonstrates a potential for brachiobasilic fistula creation in right arm.  Although patient is agreeable he is also concerned about missing time at work as he is still trying to work a full-time schedule in addition to receiving hemodialysis on a Monday Wednesday Friday schedule on [employer].  The pt is on a statin for cholesterol management.  The pt is not on a daily aspirin.  Other AC:  none The pt is on CCB, BB for hypertension.   The pt is diabetic.   Tobacco hx:  never   Past Medical History:  Diagnosis Date  . Chronic kidney disease    dialysis M-W-F  . Diabetes mellitus without complication ([place])    type 2  . Hypertension   . LV dysfunction   . Obesity     Past Surgical History:  Procedure Laterality Date  . AV FISTULA PLACEMENT Left 04/10/2017   Procedure: ARTERIOVENOUS (AV) FISTULA CREATION LEFT ARM;  Surgeon: Conrad Guayanilla, MD;  Location: [place];  Service: Vascular;  Laterality: Left;  . ESOPHAGOGASTRODUODENOSCOPY (EGD) WITH PROPOFOL N/A 04/04/2017   Procedure: ESOPHAGOGASTRODUODENOSCOPY (EGD) WITH PROPOFOL;  Surgeon: Carol Ada, MD;   Location: [place];  Service: Endoscopy;  Laterality: N/A;  . 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 INSERTION OF DIALYSIS CATHETER Right 04/10/2017   Procedure: INSERTION OF DIALYSIS CATHETER - RIGHT INTERNAL JUGULAR PLACEMENT;  Surgeon: Conrad Weston, MD;  Location: [place];  Service: Vascular;  Laterality: Right;  . TONSILLECTOMY AND ADENOIDECTOMY      No Known Allergies  Current Outpatient Medications  Medication Sig Dispense Refill  . Accu-Chek FastClix Lancets MISC Use as instructed to check blood sugar once daily. E11.9 102 each 11  . amLODipine (NORVASC) 10 MG tablet Take 1 tablet (10 mg total) by mouth daily. 30 tablet 5  . Blood Glucose Monitoring Suppl (ACCU-CHEK GUIDE ME) w/Device KIT 1 kit by Does not apply route daily. Use as instructed to check blood sugar once daily. E11.9 1 kit 0  . carvedilol (COREG) 3.125 MG tablet TAKE 1 TABLET (3.125 MG TOTAL) BY MOUTH 2 (TWO) TIMES DAILY WITH A MEAL. 60 tablet 2  . glucose blood (ACCU-CHEK GUIDE) test strip Use as instructed to check blood sugar once daily. E11.9 100 each 12  . HYDROcodone-acetaminophen (NORCO) 5-325 MG tablet Take 1 tablet by mouth every 6 (six) hours as needed for moderate pain. 15 tablet 0  . lovastatin (  MEVACOR) 20 MG tablet Take 1 tablet (20 mg total) by mouth at bedtime. 30 tablet 3  . Magnesium Sulfate, Laxative, (EPSOM SALT PO) Apply 1 application topically as needed (foot pain.). Epsom Salts Cream    . sevelamer carbonate (RENVELA) 800 MG tablet Take 2,400-3,200 mg by mouth See admin instructions. Take 4 tablets (3200 mg) by mouth with each meal & take 3 tablets (2400 mg) by mouth with each snack     No current facility-administered medications for this visit.    Family History  Problem Relation Age of Onset  .  Hypertension Mother   . Diabetes Mother   . Hypertension Father     Social History   Socioeconomic History  . Marital status: Married    Spouse name: Not on file  . Number of children: Not on file  . Years of education: Not on file  . Highest education level: Not on file  Occupational History  . Not on file  Tobacco Use  . Smoking status: Never Smoker  . Smokeless tobacco: Never Used  Substance and Sexual Activity  . Alcohol use: Not Currently  . Drug use: No  . Sexual activity: Yes  Other Topics Concern  . Not on file  Social History Narrative   He is married with step kids. He works at [employer].    Social Determinants of Health   Financial Resource Strain:   . Difficulty of Paying Living Expenses: Not on file  Food Insecurity:   . Worried About Charity fundraiser in the Last Year: Not on file  . Ran Out of Food in the Last Year: Not on file  Transportation Needs:   . Lack of Transportation (Medical): Not on file  . Lack of Transportation (Non-Medical): Not on file  Physical Activity:   . Days of Exercise per Week: Not on file  . Minutes of Exercise per Session: Not on file  Stress:   . Feeling of Stress : Not on file  Social Connections:   . Frequency of Communication with Friends and Family: Not on file  . Frequency of Social Gatherings with Friends and Family: Not on file  . Attends Religious Services: Not on file  . Active Member of Clubs or Organizations: Not on file  . Attends Archivist Meetings: Not on file  . Marital Status: Not on file  Intimate Partner Violence:   . Fear of Current or Ex-Partner: Not on file  . Emotionally Abused: Not on file  . Physically Abused: Not on file  . Sexually Abused: Not on file     ROS: '[x]'  Positive   '[ ]'  Negative   '[ ]'  All sytems reviewed and are negative  Cardiac: '[]'  chest pain/pressure '[]'  SOB '[]'  DOE  Vascular: '[]'  pain in legs while walking '[]'  pain in feet when lying flat '[]'  hx of DVT  '[]'  swelling in legs  Pulmonary: '[]'  asthma '[]'  wheezing  Neurologic: '[]'  weakness in '[]'  arms '[]'  legs '[]'  numbness in '[]'  arms '[]'  legs '[]' difficulty speaking or slurred speech '[]'  temporary loss of vision in one eye '[]'  dizziness  Hematologic: '[]'  bleeding problems  GI '[]'  GERD  GU: '[x]'  CKD/renal failure  '[]'  HD---'[]'  M/W/F '[]'  T/T/S '[]'  burning with urination '[]'  blood in urine  Psychiatric: '[]'  hx of major depression  Integumentary: '[]'  rashes '[]'  ulcers  Constitutional: '[]'  fever '[]'  chills  PHYSICAL EXAMINATION:     General:  WDWN male in NAD Gait: Not observed HENT: WNL  Pulmonary: normal non-labored breathing , without Rales, rhonchi,  wheezing Cardiac: regular Abdomen: soft, NT, no masses  Vascular Exam/Pulses:   Right Left  Radial 2+ (normal) 2+ (normal)   Extremities:  without ischemic changes, without Gangrene, without cellulitis; without open wounds;  Musculoskeletal: no muscle wasting or atrophy  Neurologic: A&O X 3; Moving all extremities equally;  Speech is fluent/normal  Non-Invasive Vascular Imaging:   Upper Extremity Vein Mapping on 06/29/2019: +-----------------+-------------+----------+--------+ Right Cephalic   Diameter (cm)Depth (cm)Findings +-----------------+-------------+----------+--------+ Shoulder             0.18                        +-----------------+-------------+----------+--------+ Prox upper arm       0.14                        +-----------------+-------------+----------+--------+ Mid upper arm        0.16                        +-----------------+-------------+----------+--------+ Dist upper arm       0.21                        +-----------------+-------------+----------+--------+ Antecubital fossa    0.31                        +-----------------+-------------+----------+--------+ Prox forearm         0.15                        +-----------------+-------------+----------+--------+ Mid forearm           0.20                        +-----------------+-------------+----------+--------+ Dist forearm         0.10                        +-----------------+-------------+----------+--------+  +-----------------+-------------+----------+------------------------+ Right Basilic    Diameter (cm)Depth (cm)        Findings         +-----------------+-------------+----------+------------------------+ Prox upper arm       0.49                                        +-----------------+-------------+----------+------------------------+ Mid upper arm        0.40                        0.322           +-----------------+-------------+----------+------------------------+ Dist upper arm       0.25                                        +-----------------+-------------+----------+------------------------+ Antecubital fossa    0.32               joins distal brachial v. +-----------------+-------------+----------+------------------------+  Summary: Right: Patent cephalic and basilic veins.        Brachial, radial, and ulnar arteries are patent.   ASSESSMENT/PLAN: 39 y.o. male with ESRD here for evaluation of his left brachiocephalic fistula with  overlying scabbing and ulceration of aneurysm  -Based on appearance of ulceration overlying fistula aneurysm, I am concerned for spontaneous bleeding if hemodialysis is continued from current access. -Dr. Scot Dock has mentioned after last revision that current access is no longer salvageable and patient should be considered for new access -I recommended to the patient ligation of left arm fistula, creation of right arm fistula, and placement of TDC to accommodate his work schedule -Patient initially was unwilling to receive a catheter and wanted to dialyze from left arm fistula while a new fistula in his right arm matures, however I expressed again my concern for spontaneous rupture and bleeding from current access with the  patient -I also recommended that the surgery be scheduled first available next week however patient would prefer an additional week to make his employer aware.  He understands the risk of delayed surgery and we will proceed with the above-mentioned procedure on a nondialysis day in 2 weeks with the next available surgeon.   Dagoberto Ligas, PA-C [facility] [phone]  Clinic MD:   Scot Dock

## 2019-09-16 ENCOUNTER — Ambulatory Visit (INDEPENDENT_AMBULATORY_CARE_PROVIDER_SITE_OTHER): Payer: Medicare Other | Admitting: Physician Assistant

## 2019-09-16 ENCOUNTER — Encounter: Payer: Self-pay | Admitting: *Deleted

## 2019-09-16 ENCOUNTER — Other Ambulatory Visit: Payer: Self-pay

## 2019-09-16 VITALS — BP 140/92 | HR 100 | Temp 98.7°F | Resp 20 | Ht 73.0 in | Wt 236.0 lb

## 2019-09-16 DIAGNOSIS — N186 End stage renal disease: Secondary | ICD-10-CM | POA: Diagnosis not present

## 2019-09-17 ENCOUNTER — Other Ambulatory Visit: Payer: Self-pay | Admitting: *Deleted

## 2019-09-24 ENCOUNTER — Encounter (HOSPITAL_COMMUNITY): Payer: Self-pay | Admitting: Vascular Surgery

## 2019-09-24 ENCOUNTER — Other Ambulatory Visit: Payer: Self-pay

## 2019-09-24 NOTE — Progress Notes (Addendum)
Spoke with Wife Cassell Smiles who states patient does not have shortness of breath, fever, cough or chest pain.  PCP - Dr. Wynetta Emery Cardiologist - denies  Chest x-ray - denies EKG - 06/10/19 Stress Test - 04/08/19 ECHO - 10/21/17 Cardiac Cath - denies  Fasting Blood Sugar - 90s-100s  Checks Blood Sugar _2_ times a day  . If your blood sugar is less than 70 mg/dL, you will need to treat for low blood sugar: o Treat a low blood sugar (less than 70 mg/dL) with  cup of clear juice (cranberry or apple), 4 glucose tablets,. o Recheck blood sugar in 15 minutes after treatment (to make sure it is greater than 70 mg/dL). If your blood sugar is not greater than 70 mg/dL on recheck, call [phone] for further instructions.  Anesthesia review: Yes  STOP now taking any Aspirin (unless otherwise instructed by your surgeon), Aleve, Naproxen, Ibuprofen, Motrin, Advil, Goody's, BC's, all herbal medications, fish oil, and all vitamins.   Coronavirus Screening Have you experienced the following symptoms:  Cough yes/no: No Fever (>100.29F)  yes/no: No Runny nose yes/no: No Sore throat yes/no: No Difficulty breathing/shortness of breath  yes/no: No  Have you traveled in the last 14 days and where? yes/no: No  Wife Cassell Smiles verbalized understanding of instructions that were given via phone.

## 2019-09-25 ENCOUNTER — Other Ambulatory Visit (HOSPITAL_COMMUNITY)
Admission: RE | Admit: 2019-09-25 | Discharge: 2019-09-25 | Disposition: A | Discharge status: Home / Self Care | Payer: Medicare Other | Source: Ambulatory Visit | Attending: Vascular Surgery | Admitting: Vascular Surgery

## 2019-09-25 DIAGNOSIS — Z20822 Contact with and (suspected) exposure to covid-19: Secondary | ICD-10-CM | POA: Diagnosis not present

## 2019-09-25 DIAGNOSIS — Z01812 Encounter for preprocedural laboratory examination: Secondary | ICD-10-CM | POA: Diagnosis not present

## 2019-09-25 LAB — SARS CORONAVIRUS 2 (TAT 6-24 HRS): SARS Coronavirus 2: NEGATIVE

## 2019-09-26 ENCOUNTER — Other Ambulatory Visit: Payer: Self-pay

## 2019-09-26 ENCOUNTER — Observation Stay (HOSPITAL_COMMUNITY)
Admission: EM | Admit: 2019-09-26 | Discharge: 2019-09-27 | Disposition: A | Discharge status: Home / Self Care | Payer: Medicare Other | Attending: Vascular Surgery | Admitting: Vascular Surgery

## 2019-09-26 ENCOUNTER — Encounter (HOSPITAL_COMMUNITY)
Admission: EM | Disposition: A | Discharge status: Home / Self Care | Payer: Self-pay | Source: Home / Self Care | Attending: Emergency Medicine

## 2019-09-26 ENCOUNTER — Emergency Department (HOSPITAL_COMMUNITY): Payer: Medicare Other | Admitting: Certified Registered Nurse Anesthetist

## 2019-09-26 ENCOUNTER — Encounter (HOSPITAL_COMMUNITY): Payer: Self-pay | Admitting: Anesthesiology

## 2019-09-26 DIAGNOSIS — E669 Obesity, unspecified: Secondary | ICD-10-CM | POA: Insufficient documentation

## 2019-09-26 DIAGNOSIS — I770 Arteriovenous fistula, acquired: Secondary | ICD-10-CM | POA: Diagnosis not present

## 2019-09-26 DIAGNOSIS — Z6831 Body mass index (BMI) 31.0-31.9, adult: Secondary | ICD-10-CM | POA: Diagnosis not present

## 2019-09-26 DIAGNOSIS — Z8249 Family history of ischemic heart disease and other diseases of the circulatory system: Secondary | ICD-10-CM | POA: Insufficient documentation

## 2019-09-26 DIAGNOSIS — R000 Tachycardia, unspecified: Secondary | ICD-10-CM | POA: Diagnosis not present

## 2019-09-26 DIAGNOSIS — Z992 Dependence on renal dialysis: Secondary | ICD-10-CM | POA: Diagnosis not present

## 2019-09-26 DIAGNOSIS — Z833 Family history of diabetes mellitus: Secondary | ICD-10-CM | POA: Diagnosis not present

## 2019-09-26 DIAGNOSIS — I96 Gangrene, not elsewhere classified: Secondary | ICD-10-CM | POA: Diagnosis not present

## 2019-09-26 DIAGNOSIS — E1152 Type 2 diabetes mellitus with diabetic peripheral angiopathy with gangrene: Secondary | ICD-10-CM | POA: Diagnosis not present

## 2019-09-26 DIAGNOSIS — Z79899 Other long term (current) drug therapy: Secondary | ICD-10-CM | POA: Insufficient documentation

## 2019-09-26 DIAGNOSIS — E1122 Type 2 diabetes mellitus with diabetic chronic kidney disease: Secondary | ICD-10-CM | POA: Insufficient documentation

## 2019-09-26 DIAGNOSIS — T82898A Other specified complication of vascular prosthetic devices, implants and grafts, initial encounter: Secondary | ICD-10-CM | POA: Diagnosis not present

## 2019-09-26 DIAGNOSIS — Z20822 Contact with and (suspected) exposure to covid-19: Secondary | ICD-10-CM | POA: Diagnosis not present

## 2019-09-26 DIAGNOSIS — I120 Hypertensive chronic kidney disease with stage 5 chronic kidney disease or end stage renal disease: Secondary | ICD-10-CM | POA: Insufficient documentation

## 2019-09-26 DIAGNOSIS — Z9115 Patient's noncompliance with renal dialysis: Secondary | ICD-10-CM | POA: Insufficient documentation

## 2019-09-26 DIAGNOSIS — Z03818 Encounter for observation for suspected exposure to other biological agents ruled out: Secondary | ICD-10-CM | POA: Diagnosis not present

## 2019-09-26 DIAGNOSIS — T82590A Other mechanical complication of surgically created arteriovenous fistula, initial encounter: Secondary | ICD-10-CM | POA: Diagnosis not present

## 2019-09-26 DIAGNOSIS — R58 Hemorrhage, not elsewhere classified: Secondary | ICD-10-CM | POA: Diagnosis not present

## 2019-09-26 DIAGNOSIS — N186 End stage renal disease: Secondary | ICD-10-CM | POA: Insufficient documentation

## 2019-09-26 DIAGNOSIS — E875 Hyperkalemia: Secondary | ICD-10-CM | POA: Diagnosis not present

## 2019-09-26 DIAGNOSIS — I10 Essential (primary) hypertension: Secondary | ICD-10-CM | POA: Diagnosis not present

## 2019-09-26 HISTORY — PX: THROMBECTOMY AND REVISION OF ARTERIOVENTOUS (AV) GORETEX  GRAFT: SHX6120

## 2019-09-26 LAB — CBC
HCT: 23.1 % — ABNORMAL LOW (ref 39.0–52.0)
Hemoglobin: 7.2 g/dL — ABNORMAL LOW (ref 13.0–17.0)
MCH: 28.5 pg (ref 26.0–34.0)
MCHC: 31.2 g/dL (ref 30.0–36.0)
MCV: 91.3 fL (ref 80.0–100.0)
Platelets: 214 10*3/uL (ref 150–400)
RBC: 2.53 MIL/uL — ABNORMAL LOW (ref 4.22–5.81)
RDW: 15.5 % (ref 11.5–15.5)
WBC: 7.2 10*3/uL (ref 4.0–10.5)
nRBC: 0 % (ref 0.0–0.2)

## 2019-09-26 LAB — BASIC METABOLIC PANEL
Anion gap: 23 — ABNORMAL HIGH (ref 5–15)
BUN: 121 mg/dL — ABNORMAL HIGH (ref 6–20)
CO2: 18 mmol/L — ABNORMAL LOW (ref 22–32)
Calcium: 7.5 mg/dL — ABNORMAL LOW (ref 8.9–10.3)
Chloride: 96 mmol/L — ABNORMAL LOW (ref 98–111)
Creatinine, Ser: 30.96 mg/dL — ABNORMAL HIGH (ref 0.61–1.24)
GFR calc Af Amer: 2 mL/min — ABNORMAL LOW (ref >60)
GFR calc non Af Amer: 2 mL/min — ABNORMAL LOW (ref >60)
Glucose, Bld: 118 mg/dL — ABNORMAL HIGH (ref 70–99)
Potassium: 5.8 mmol/L — ABNORMAL HIGH (ref 3.5–5.1)
Sodium: 137 mmol/L (ref 135–145)

## 2019-09-26 LAB — RESPIRATORY PANEL BY RT PCR (FLU A&B, COVID)
Influenza A by PCR: NEGATIVE
Influenza B by PCR: NEGATIVE
SARS Coronavirus 2 by RT PCR: NEGATIVE

## 2019-09-26 LAB — GLUCOSE, CAPILLARY
Glucose-Capillary: 90 mg/dL (ref 70–99)
Glucose-Capillary: 153 mg/dL — ABNORMAL HIGH (ref 70–99)
Glucose-Capillary: 169 mg/dL — ABNORMAL HIGH (ref 70–99)
Glucose-Capillary: 180 mg/dL — ABNORMAL HIGH (ref 70–99)

## 2019-09-26 LAB — CBG MONITORING, ED: Glucose-Capillary: 112 mg/dL — ABNORMAL HIGH (ref 70–99)

## 2019-09-26 LAB — HIV ANTIBODY (ROUTINE TESTING W REFLEX): HIV Screen 4th Generation wRfx: NONREACTIVE

## 2019-09-26 SURGERY — THROMBECTOMY AND REVISION OF ARTERIOVENTOUS (AV) GORETEX  GRAFT
Anesthesia: General | Laterality: Left

## 2019-09-26 MED ORDER — INSULIN ASPART 100 UNIT/ML ~~LOC~~ SOLN
0.0000 [IU] | Freq: Three times a day (TID) | SUBCUTANEOUS | Status: DC
  Administered 2019-09-26 (×2): 1 [IU] via SUBCUTANEOUS

## 2019-09-26 MED ORDER — LABETALOL HCL 5 MG/ML IV SOLN: 10.0000 mg | INTRAVENOUS | Status: DC | PRN

## 2019-09-26 MED ORDER — LABETALOL HCL 5 MG/ML IV SOLN
5.0000 mg | INTRAVENOUS | Status: DC | PRN
  Administered 2019-09-26: 10:00:00 10 mg via INTRAVENOUS
  Administered 2019-09-26 (×2): 5 mg via INTRAVENOUS

## 2019-09-26 MED ORDER — PROPOFOL 10 MG/ML IV BOLUS
INTRAVENOUS | Status: AC
  Filled 2019-09-26: qty 20

## 2019-09-26 MED ORDER — MIDAZOLAM HCL 5 MG/5ML IJ SOLN
INTRAMUSCULAR | Status: DC | PRN
  Administered 2019-09-26: 2 mg via INTRAVENOUS

## 2019-09-26 MED ORDER — CARVEDILOL 6.25 MG PO TABS
6.2500 mg | ORAL_TABLET | Freq: Two times a day (BID) | ORAL | Status: DC
  Administered 2019-09-26 – 2019-09-27 (×2): 6.25 mg via ORAL
  Filled 2019-09-26 (×2): qty 1

## 2019-09-26 MED ORDER — SENNOSIDES-DOCUSATE SODIUM 8.6-50 MG PO TABS: 1.0000 | ORAL_TABLET | Freq: Every evening | ORAL | Status: DC | PRN

## 2019-09-26 MED ORDER — ONDANSETRON HCL 4 MG/2ML IJ SOLN
INTRAMUSCULAR | Status: DC | PRN
  Administered 2019-09-26: 4 mg via INTRAVENOUS

## 2019-09-26 MED ORDER — HYDRALAZINE HCL 20 MG/ML IJ SOLN: 5.0000 mg | INTRAMUSCULAR | Status: DC | PRN

## 2019-09-26 MED ORDER — HYDROMORPHONE HCL 1 MG/ML IJ SOLN
INTRAMUSCULAR | Status: AC
  Filled 2019-09-26: qty 1

## 2019-09-26 MED ORDER — PANTOPRAZOLE SODIUM 40 MG PO TBEC
40.0000 mg | DELAYED_RELEASE_TABLET | Freq: Every day | ORAL | Status: DC
  Administered 2019-09-27: 09:00:00 40 mg via ORAL
  Filled 2019-09-26: qty 1

## 2019-09-26 MED ORDER — PHENYLEPHRINE 40 MCG/ML (10ML) SYRINGE FOR IV PUSH (FOR BLOOD PRESSURE SUPPORT)
PREFILLED_SYRINGE | INTRAVENOUS | Status: DC | PRN
  Administered 2019-09-26: 120 ug via INTRAVENOUS

## 2019-09-26 MED ORDER — PROPOFOL 10 MG/ML IV BOLUS
INTRAVENOUS | Status: DC | PRN
  Administered 2019-09-26: 150 mg via INTRAVENOUS

## 2019-09-26 MED ORDER — FENTANYL CITRATE (PF) 100 MCG/2ML IJ SOLN
INTRAMUSCULAR | Status: DC | PRN
  Administered 2019-09-26: 50 ug via INTRAVENOUS
  Administered 2019-09-26: 100 ug via INTRAVENOUS
  Administered 2019-09-26: 50 ug via INTRAVENOUS

## 2019-09-26 MED ORDER — ONDANSETRON HCL 4 MG/2ML IJ SOLN
INTRAMUSCULAR | Status: AC
  Filled 2019-09-26: qty 2

## 2019-09-26 MED ORDER — METOPROLOL TARTRATE 5 MG/5ML IV SOLN: 2.0000 mg | INTRAVENOUS | Status: DC | PRN

## 2019-09-26 MED ORDER — PRAVASTATIN SODIUM 10 MG PO TABS
20.0000 mg | ORAL_TABLET | Freq: Every day | ORAL | Status: DC
  Administered 2019-09-26: 20 mg via ORAL
  Filled 2019-09-26: qty 2

## 2019-09-26 MED ORDER — PHENOL 1.4 % MT LIQD: 1.0000 | OROMUCOSAL | Status: DC | PRN

## 2019-09-26 MED ORDER — LIDOCAINE 2% (20 MG/ML) 5 ML SYRINGE
INTRAMUSCULAR | Status: AC
  Filled 2019-09-26: qty 5

## 2019-09-26 MED ORDER — HYDROMORPHONE HCL 1 MG/ML IJ SOLN
0.2500 mg | INTRAMUSCULAR | Status: DC | PRN
  Administered 2019-09-26 (×4): 0.25 mg via INTRAVENOUS

## 2019-09-26 MED ORDER — ACETAMINOPHEN 325 MG PO TABS: 325.0000 mg | ORAL_TABLET | ORAL | Status: DC | PRN

## 2019-09-26 MED ORDER — SODIUM CHLORIDE 0.9 % IV SOLN
INTRAVENOUS | Status: AC
  Filled 2019-09-26: qty 1.2

## 2019-09-26 MED ORDER — DEXAMETHASONE SODIUM PHOSPHATE 10 MG/ML IJ SOLN
INTRAMUSCULAR | Status: AC
  Filled 2019-09-26: qty 1

## 2019-09-26 MED ORDER — MIDAZOLAM HCL 2 MG/2ML IJ SOLN
INTRAMUSCULAR | Status: AC
  Filled 2019-09-26: qty 2

## 2019-09-26 MED ORDER — SODIUM CHLORIDE 0.9 % IV SOLN
INTRAVENOUS | Status: DC | PRN
  Administered 2019-09-26: 500 mL

## 2019-09-26 MED ORDER — CARVEDILOL 3.125 MG PO TABS
ORAL_TABLET | ORAL | Status: AC
  Filled 2019-09-26: qty 2

## 2019-09-26 MED ORDER — CEFAZOLIN SODIUM-DEXTROSE 2-4 GM/100ML-% IV SOLN
2.0000 g | Freq: Once | INTRAVENOUS | Status: AC
  Administered 2019-09-26: 2 g via INTRAVENOUS

## 2019-09-26 MED ORDER — PHENYLEPHRINE HCL-NACL 10-0.9 MG/250ML-% IV SOLN
INTRAVENOUS | Status: DC | PRN
  Administered 2019-09-26: 50 ug/min via INTRAVENOUS

## 2019-09-26 MED ORDER — ONDANSETRON HCL 4 MG/2ML IJ SOLN: 4.0000 mg | Freq: Four times a day (QID) | INTRAMUSCULAR | Status: DC | PRN

## 2019-09-26 MED ORDER — ALUM & MAG HYDROXIDE-SIMETH 200-200-20 MG/5ML PO SUSP: 15.0000 mL | ORAL | Status: DC | PRN

## 2019-09-26 MED ORDER — MORPHINE SULFATE (PF) 2 MG/ML IV SOLN: 2.0000 mg | INTRAVENOUS | Status: DC | PRN

## 2019-09-26 MED ORDER — GUAIFENESIN-DM 100-10 MG/5ML PO SYRP: 15.0000 mL | ORAL_SOLUTION | ORAL | Status: DC | PRN

## 2019-09-26 MED ORDER — LABETALOL HCL 5 MG/ML IV SOLN
INTRAVENOUS | Status: AC
  Filled 2019-09-26: qty 4

## 2019-09-26 MED ORDER — CEFAZOLIN SODIUM-DEXTROSE 2-4 GM/100ML-% IV SOLN
INTRAVENOUS | Status: AC
  Filled 2019-09-26: qty 100

## 2019-09-26 MED ORDER — CARVEDILOL 3.125 MG PO TABS
6.2500 mg | ORAL_TABLET | Freq: Once | ORAL | Status: AC
  Administered 2019-09-26: 08:00:00 6.25 mg via ORAL

## 2019-09-26 MED ORDER — FENTANYL CITRATE (PF) 250 MCG/5ML IJ SOLN
INTRAMUSCULAR | Status: AC
  Filled 2019-09-26: qty 5

## 2019-09-26 MED ORDER — AMLODIPINE BESYLATE 10 MG PO TABS
10.0000 mg | ORAL_TABLET | Freq: Every day | ORAL | Status: DC
  Administered 2019-09-26 – 2019-09-27 (×2): 10 mg via ORAL
  Filled 2019-09-26 (×2): qty 1

## 2019-09-26 MED ORDER — BISACODYL 10 MG RE SUPP: 10.0000 mg | Freq: Every day | RECTAL | Status: DC | PRN

## 2019-09-26 MED ORDER — ONDANSETRON HCL 4 MG/2ML IJ SOLN: 4.0000 mg | Freq: Once | INTRAMUSCULAR | Status: DC | PRN

## 2019-09-26 MED ORDER — DEXAMETHASONE SODIUM PHOSPHATE 10 MG/ML IJ SOLN
INTRAMUSCULAR | Status: DC | PRN
  Administered 2019-09-26: 10 mg via INTRAVENOUS

## 2019-09-26 MED ORDER — PHENYLEPHRINE 40 MCG/ML (10ML) SYRINGE FOR IV PUSH (FOR BLOOD PRESSURE SUPPORT)
PREFILLED_SYRINGE | INTRAVENOUS | Status: AC
  Filled 2019-09-26: qty 10

## 2019-09-26 MED ORDER — ACETAMINOPHEN 325 MG RE SUPP: 325.0000 mg | RECTAL | Status: DC | PRN

## 2019-09-26 MED ORDER — MEPERIDINE HCL 25 MG/ML IJ SOLN: 6.2500 mg | INTRAMUSCULAR | Status: DC | PRN

## 2019-09-26 MED ORDER — HEPARIN SODIUM (PORCINE) 5000 UNIT/ML IJ SOLN
5000.0000 [IU] | Freq: Three times a day (TID) | INTRAMUSCULAR | Status: DC
  Administered 2019-09-26 – 2019-09-27 (×3): 5000 [IU] via SUBCUTANEOUS
  Filled 2019-09-26 (×3): qty 1

## 2019-09-26 MED ORDER — LIDOCAINE HCL (CARDIAC) PF 100 MG/5ML IV SOSY
PREFILLED_SYRINGE | INTRAVENOUS | Status: DC | PRN
  Administered 2019-09-26: 100 mg via INTRAVENOUS

## 2019-09-26 MED ORDER — SODIUM CHLORIDE 0.9 % IV SOLN: INTRAVENOUS | Status: DC

## 2019-09-26 MED ORDER — OXYCODONE-ACETAMINOPHEN 5-325 MG PO TABS
1.0000 | ORAL_TABLET | ORAL | Status: DC | PRN
  Administered 2019-09-26 – 2019-09-27 (×3): 2 via ORAL
  Filled 2019-09-26 (×3): qty 2

## 2019-09-26 MED ORDER — PHENYLEPHRINE HCL (PRESSORS) 10 MG/ML IV SOLN
INTRAVENOUS | Status: AC
  Filled 2019-09-26: qty 1

## 2019-09-26 MED ORDER — 0.9 % SODIUM CHLORIDE (POUR BTL) OPTIME
TOPICAL | Status: DC | PRN
  Administered 2019-09-26: 1000 mL

## 2019-09-26 SURGICAL SUPPLY — 42 items
ARMBAND PINK RESTRICT EXTREMIT (MISCELLANEOUS) ×2 IMPLANT
BNDG ELASTIC 4X5.8 VLCR STR LF (GAUZE/BANDAGES/DRESSINGS) ×2 IMPLANT
BNDG GAUZE ELAST 4 BULKY (GAUZE/BANDAGES/DRESSINGS) ×2 IMPLANT
CANISTER SUCT 3000ML PPV (MISCELLANEOUS) ×2 IMPLANT
CANNULA VESSEL 3MM 2 BLNT TIP (CANNULA) ×2 IMPLANT
CATH EMB 3FR 80CM (CATHETERS) IMPLANT
CATH EMB 4FR 80CM (CATHETERS) IMPLANT
CATH EMB 5FR 80CM (CATHETERS) IMPLANT
CLIP VESOCCLUDE MED 6/CT (CLIP) ×2 IMPLANT
CLIP VESOCCLUDE SM WIDE 6/CT (CLIP) ×2 IMPLANT
COVER WAND RF STERILE (DRAPES) ×2 IMPLANT
DECANTER SPIKE VIAL GLASS SM (MISCELLANEOUS) IMPLANT
DERMABOND ADVANCED (GAUZE/BANDAGES/DRESSINGS) ×1
DERMABOND ADVANCED .7 DNX12 (GAUZE/BANDAGES/DRESSINGS) ×1 IMPLANT
DRAPE X-RAY CASS 24X20 (DRAPES) IMPLANT
ELECT REM PT RETURN 9FT ADLT (ELECTROSURGICAL) ×2
ELECTRODE REM PT RTRN 9FT ADLT (ELECTROSURGICAL) ×1 IMPLANT
GAUZE SPONGE 4X4 12PLY STRL LF (GAUZE/BANDAGES/DRESSINGS) ×2 IMPLANT
GLOVE BIO SURGEON STRL SZ7.5 (GLOVE) ×2 IMPLANT
GOWN STRL REUS W/ TWL LRG LVL3 (GOWN DISPOSABLE) ×3 IMPLANT
GOWN STRL REUS W/TWL LRG LVL3 (GOWN DISPOSABLE) ×3
HEMOSTAT SPONGE AVITENE ULTRA (HEMOSTASIS) IMPLANT
KIT BASIN OR (CUSTOM PROCEDURE TRAY) ×2 IMPLANT
KIT TURNOVER KIT B (KITS) ×2 IMPLANT
LOOP VESSEL MINI RED (MISCELLANEOUS) IMPLANT
NS IRRIG 1000ML POUR BTL (IV SOLUTION) ×2 IMPLANT
PACK CV ACCESS (CUSTOM PROCEDURE TRAY) ×2 IMPLANT
PAD ARMBOARD 7.5X6 YLW CONV (MISCELLANEOUS) ×4 IMPLANT
SET COLLECT BLD 21X3/4 12 (NEEDLE) IMPLANT
STOPCOCK 4 WAY LG BORE MALE ST (IV SETS) IMPLANT
SUT ETHILON 3 0 PS 1 (SUTURE) ×8 IMPLANT
SUT PROLENE 5 0 C 1 24 (SUTURE) ×8 IMPLANT
SUT PROLENE 6 0 CC (SUTURE) ×4 IMPLANT
SUT VIC AB 2-0 SH 27 (SUTURE) ×2
SUT VIC AB 2-0 SH 27XBRD (SUTURE) ×2 IMPLANT
SUT VIC AB 3-0 SH 27 (SUTURE) ×1
SUT VIC AB 3-0 SH 27X BRD (SUTURE) ×1 IMPLANT
SUT VICRYL 4-0 PS2 18IN ABS (SUTURE) ×2 IMPLANT
TOWEL GREEN STERILE (TOWEL DISPOSABLE) ×2 IMPLANT
TUBING EXTENTION W/L.L. (IV SETS) IMPLANT
UNDERPAD 30X30 (UNDERPADS AND DIAPERS) ×2 IMPLANT
WATER STERILE IRR 1000ML POUR (IV SOLUTION) ×2 IMPLANT

## 2019-09-26 NOTE — Progress Notes (Signed)
Pt transferred to 4E-14 via bed from PACU. Pt given CHG bath. Tele applied, CCMD notified. Pt oriented to call bell, room and bed. Call bell within reach. VSS. L arm compression wrap in place. Will continue to monitor. Amanda Cockayne, RN

## 2019-09-26 NOTE — Anesthesia Procedure Notes (Signed)
Procedure Name: LMA Insertion Date/Time: 09/26/2019 8:37 AM Performed by: Jenne Campus, CRNA Pre-anesthesia Checklist: Patient identified, Emergency Drugs available, Suction available and Patient being monitored Patient Re-evaluated:Patient Re-evaluated prior to induction Oxygen Delivery Method: Circle System Utilized Preoxygenation: Pre-oxygenation with 100% oxygen Induction Type: IV induction Ventilation: Mask ventilation without difficulty LMA: LMA inserted LMA Size: 5.0 Number of attempts: 1 Airway Equipment and Method: Bite block Placement Confirmation: positive ETCO2 and breath sounds checked- equal and bilateral Tube secured with: Tape Dental Injury: Teeth and Oropharynx as per pre-operative assessment

## 2019-09-26 NOTE — ED Notes (Signed)
Vascular MD at bedside.

## 2019-09-26 NOTE — Transfer of Care (Signed)
Immediate Anesthesia Transfer of Care Note  Patient: Patrick Ibarra  Procedure(s) Performed: LIGATION OF ARTERIOVENTOUS (AV) GORETEX  GRAFT (Left )  Patient Location: PACU  Anesthesia Type:General  Level of Consciousness: oriented, drowsy and patient cooperative  Airway & Oxygen Therapy: Patient Spontanous Breathing and Patient connected to face mask oxygen  Post-op Assessment: Report given to RN and Post -op Vital signs reviewed and stable  Post vital signs: Reviewed  Last Vitals:  Vitals Value Taken Time  BP    Temp    Pulse    Resp    SpO2      Last Pain:  Vitals:   09/26/19 0511  PainSc: Asleep         Complications: No apparent anesthesia complications

## 2019-09-26 NOTE — Op Note (Signed)
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  The area of skin necrosis was about 4 x 5 cm in diameter.  It was full-thickness.  There was an erosion at the center of this which had been oozing and was at high risk for significant bleeding.  Operative findings: Large aneurysm with necrotic skin  Operative details: After team informed consent, the patient was taken to the operating room.  The patient was placed in supine position operating table.  After induction general anesthesia and placement of a laryngeal mask patient's entire left upper extremities prepped and draped in usual sterile fashion.  A tourniquet was placed on the upper arm in case we needed vascular control.  Next a transverse incision was made near the antecubital crease carried down through subcutaneous tissues down to the level of her pre-existing fistula.  The fistula was about 10 mm in diameter in this location.  It was clamped proximally and distally.  It was then divided and the proximal aspect oversewn with a running 5-0 Prolene suture.  The distal aspect of the vein was also oversewn with a running 5-0 Prolene suture.  At this point attention was turned to the patient's area of aneurysmal degeneration.  Again this, encompassed an area of about 5 to 7 cm of necrotic skin and erosion of the fistula through the skin.  An ellipse of skin was made around the necrotic skin back to reasonable healthy skin tissue.  Incision was carried down through subcutaneous tissues down the level aneurysm.  The aneurysm was divided proximally and distally and completely excised.  Hemostasis was  obtained.  The distal end of the vein was oversewn with a running 5-0 Prolene suture.  Hemostasis was obtained.  The wound was thoroughly irrigated with normal saline solution.  Subcutaneous tissues were then reapproximated with interrupted 2-0 Vicryl sutures.  The skin was closed with alternating 3 0 vertical mattress and simple nylon sutures.  A dry sterile dressing was applied as well as an Ace wrap.  The patient tolerated procedure well and there were no complications.  The instrument sponge and needle count was correct at the end of the case.  Patient was taken to recovery in stable condition.  Ruta Hinds, MD [facility] [place] Office: [phone]

## 2019-09-26 NOTE — ED Provider Notes (Signed)
[facility] Emergency Department Provider Note MRN:  [id number]  Arrival date & time: 09/26/19     Chief Complaint   Bleeding fistula History of Present Illness   Patrick Ibarra is a 39 y.o. year-old male with a history of ESRD, diabetes presenting to the ED with chief complaint of bleeding fistula.  Patient explains that he has had expansion of his AV fistula over the past several months or years and is scheduled for a revision surgery in 2 days.  He explains that he picked a scab off of the fistula early this morning and noted some small bleeding during the day.  He was watching TV shortly prior to arrival when the bleeding became much more significant.  Bleeding has improved significantly with EMS intervention.  Denies any other symptoms.  Last dialysis was Friday.  Review of Systems  A complete 10 system review of systems was obtained and all systems are negative except as noted in the HPI and PMH.   Patient's Health History    Past Medical History:  Diagnosis Date  . Chronic kidney disease    dialysis M-W-F  . Diabetes mellitus without complication (HCC)    type 2, diet controlled  . Hypertension   . LV dysfunction   . Obesity     Past Surgical History:  Procedure Laterality Date  . AV FISTULA PLACEMENT Left 04/10/2017   Procedure: ARTERIOVENOUS (AV) FISTULA CREATION LEFT ARM;  Surgeon: Conrad Portola, MD;  Location: [place];  Service: Vascular;  Laterality: Left;  . ESOPHAGOGASTRODUODENOSCOPY (EGD) WITH PROPOFOL N/A 04/04/2017   Procedure: ESOPHAGOGASTRODUODENOSCOPY (EGD) WITH PROPOFOL;  Surgeon: Carol Ada, MD;  Location: [place];  Service: Endoscopy;  Laterality: N/A;  . FISTULA SUPERFICIALIZATION Left 06/22/2019   Procedure: 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 Primary repair.;  Surgeon: Angelia Mould, MD;  Location: [facility] OR;  Service: Vascular;  Laterality: Left;  . INSERTION OF DIALYSIS CATHETER Right 04/10/2017   Procedure: INSERTION OF DIALYSIS CATHETER - RIGHT INTERNAL JUGULAR PLACEMENT;  Surgeon: Conrad Cornwall, MD;  Location: [place];  Service: Vascular;  Laterality: Right;  . TONSILLECTOMY AND ADENOIDECTOMY      Family History  Problem Relation Age of Onset  . Hypertension Mother   . Diabetes Mother   . Hypertension Father     Social History   Socioeconomic History  . Marital status: Married    Spouse name: Not on file  . Number of children: Not on file  . Years of education: Not on file  . Highest education level: Not on file  Occupational History  . Not on file  Tobacco Use  . Smoking status: Never Smoker  . Smokeless tobacco: Never Used  Substance and Sexual Activity  . Alcohol use: Not Currently  . Drug use: No  . Sexual activity: Yes  Other Topics Concern  . Not on file  Social History Narrative   He is married with step kids. He works at [employer].    Social Determinants of Health   Financial Resource Strain:   . Difficulty of Paying Living Expenses: Not on file  Food Insecurity:   . Worried About Charity fundraiser in the Last Year: Not on file  . Ran Out of Food in the Last Year: Not on file  Transportation Needs:   . Lack of Transportation (Medical): Not on file  . Lack of Transportation (Non-Medical): Not on file  Physical Activity:   . Days of  Exercise per Week: Not on file  . Minutes of Exercise per Session: Not on file  Stress:   . Feeling of Stress : Not on file  Social Connections:   . Frequency of Communication with Friends and Family: Not on file  . Frequency of Social Gatherings with Friends and Family: Not on file  . Attends Religious Services: Not on file  . Active Member of Clubs or Organizations: Not on file  . Attends Archivist Meetings: Not on file  . Marital Status: Not on file  Intimate Partner Violence:   . Fear of Current or Ex-Partner: Not on file  . Emotionally Abused: Not on file  . Physically Abused: Not on file  .  Sexually Abused: Not on file     Physical Exam   Vitals:   09/26/19 0500 09/26/19 0600  BP: (!) 172/115   Pulse: 98 90  Resp: 17 12  SpO2: 100% 99%    CONSTITUTIONAL: Well-appearing, NAD NEURO:  Alert and oriented x 3, no focal deficits EYES:  eyes equal and reactive ENT/NECK:  no LAD, no JVD CARDIO: Tachycardic rate, well-perfused, normal S1 and S2 PULM:  CTAB no wheezing or rhonchi GI/GU:  normal bowel sounds, non-distended, non-tender MSK/SPINE:  No gross deformities, no edema SKIN:  no rash, atraumatic; large bulbous left arm AV fistula, no active bleeding PSYCH:  Appropriate speech and behavior  *Additional and/or pertinent findings included in MDM below  Diagnostic and Interventional Summary    EKG Interpretation  Date/Time:    Ventricular Rate:    PR Interval:    QRS Duration:   QT Interval:    QTC Calculation:   R Axis:     Text Interpretation:        Cardiac Monitoring Interpretation:  Labs Reviewed  RESPIRATORY PANEL BY RT PCR (FLU A&B, COVID)  CBC  BASIC METABOLIC PANEL    No orders to display    Medications - No data to display   Procedures  /  Critical Care .Critical Care Performed by: Maudie Flakes, MD Authorized by: Maudie Flakes, MD   Critical care provider statement:    Critical care time (minutes):  33   Critical care was necessary to treat or prevent imminent or life-threatening deterioration of the following conditions: Bleeding AV fistula with aneurysm.   Critical care was time spent personally by me on the following activities:  Discussions with consultants, evaluation of patient's response to treatment, examination of patient, ordering and performing treatments and interventions, ordering and review of laboratory studies, ordering and review of radiographic studies, pulse oximetry, re-evaluation of patient's condition, obtaining history from patient or surrogate and review of old charts    ED Course and Medical Decision Making    I have reviewed the triage vital signs, the nursing notes, and pertinent available records from the EMR.  Pertinent labs & imaging results that were available during my care of the patient were reviewed by me and considered in my medical decision making (see below for details).     Bleeding is well controlled, patient has a preadmission establish for operative management of this AV fistula in 2 days.  On my evaluation patient is with heart rate 99, well-appearing, no other complaints.  The AV fistula is large, pulsatile, with ulcer.  Will observe overnight and consult vascular in the morning to determine need for more urgent surgical intervention.  Signed out to oncoming provider at shift change.    Barth Kirks. Sedonia Small, MD [facility] [place] [email or web address]  Final Clinical Impressions(s) / ED Diagnoses     ICD-10-CM   1. Malfunction of arteriovenous dialysis fistula, initial encounter [facility])  T82.590A     ED Discharge Orders    None       Discharge Instructions Discussed with and Provided to Patient:   Discharge Instructions   None       Maudie Flakes, MD 09/26/19 [phone]

## 2019-09-26 NOTE — ED Triage Notes (Signed)
Pt coming by EMS with complaints of his fistula bleeding. Pt said he had a scab on it earlier today and then noticed blood was running down the side of his arm while watching tv tonight. Said he could not get bleeding to stop. Site oozing upon arrival but bleeding well control. All vitals WDL with EMS. Pt states he is not having any pain. Said he was a little whoozy during the initial bleeding, but feels fine now

## 2019-09-26 NOTE — Anesthesia Preprocedure Evaluation (Addendum)
Anesthesia Evaluation  Patient identified by MRN, date of birth, ID band Patient awake    Reviewed: Allergy & Precautions, NPO status , Patient's Chart, lab work & pertinent test results, reviewed documented beta blocker date and time   Airway Mallampati: III  TM Distance: >3 FB Neck ROM: Full    Dental  (+) Poor Dentition, Dental Advisory Given   Pulmonary    Pulmonary exam normal        Cardiovascular hypertension, Pt. on medications and Pt. on home beta blockers Normal cardiovascular exam     Neuro/Psych Depression    GI/Hepatic   Endo/Other  diabetes, Type 2  Renal/GU ESRF and DialysisRenal diseaseLast HD Friday 1/22     Musculoskeletal   Abdominal   Peds  Hematology   Anesthesia Other Findings   Reproductive/Obstetrics                            Anesthesia Physical Anesthesia Plan  ASA: III  Anesthesia Plan: General   Post-op Pain Management:    Induction: Intravenous  PONV Risk Score and Plan: 2 and Ondansetron and Midazolam  Airway Management Planned: LMA  Additional Equipment:   Intra-op Plan:   Post-operative Plan: Extubation in OR  Informed Consent: I have reviewed the patients History and Physical, chart, labs and discussed the procedure including the risks, benefits and alternatives for the proposed anesthesia with the patient or authorized representative who has indicated his/her understanding and acceptance.       Plan Discussed with: CRNA and Surgeon  Anesthesia Plan Comments:         Anesthesia Quick Evaluation

## 2019-09-26 NOTE — Anesthesia Postprocedure Evaluation (Signed)
Anesthesia Post Note  Patient: Patrick Ibarra  Procedure(s) Performed: LIGATION OF ARTERIOVENTOUS (AV) GORETEX  GRAFT (Left )     Patient location during evaluation: PACU Anesthesia Type: General Level of consciousness: awake and alert Pain management: pain level controlled Vital Signs Assessment: post-procedure vital signs reviewed and stable Respiratory status: spontaneous breathing, nonlabored ventilation, respiratory function stable and patient connected to nasal cannula oxygen Cardiovascular status: blood pressure returned to baseline and stable Postop Assessment: no apparent nausea or vomiting Anesthetic complications: no    Last Vitals:  Vitals:   09/26/19 1130 09/26/19 1145  BP: (!) 145/111 (!) 141/106  Pulse: 68 72  Resp: 10 14  Temp:    SpO2: 100% 100%    Last Pain:  Vitals:   09/26/19 1117  TempSrc: Tympanic  PainSc:                  Rodney Wigger DAVID

## 2019-09-26 NOTE — Plan of Care (Signed)
  Problem: Clinical Measurements: Goal: Respiratory complications will improve Outcome: Progressing Goal: Cardiovascular complication will be avoided Outcome: Progressing   Problem: Coping: Goal: Level of anxiety will decrease Outcome: Progressing   

## 2019-09-26 NOTE — H&P (Signed)
Referring Physician: [place]  Patient name: Patrick Ibarra MRN: [id number] DOB: 1980/09/17 Sex: male  REASON FOR CONSULT: Aneurysmal degeneration left arm AV fistula  HPI: Patrick Ibarra is a 39 y.o. male, who presented to the emergency room at [facility] this morning around 1 AM with aneurysmal degeneration left arm AV fistula with some bleeding.  The bleeding was controlled in the emergency room.  Patient currently dialyzes on Monday Wednesday Friday.  He had a similar episode which was repaired by Dr. Scot Dock 3 months ago.  At that time Dr. Scot Dock had recommended new access in the right arm as the left arm AV fistula would most likely not be salvageable long-term.  He was then subsequently seen by my partner Dr. Carlis Abbott in October and it was recommended that he have a right arm AV fistula placed.  The patient declined at that point.  He is not on any anticoagulation.  Patient states his last hemodialysis session was on Friday, 2 days ago.  He states he had some water before he came to the emergency room this morning.  Other medical problems include diabetes hypertension obesity all of which are currently stable.  Past Medical History:  Diagnosis Date  . Chronic kidney disease    dialysis M-W-F  . Diabetes mellitus without complication (HCC)    type 2, diet controlled  . Hypertension   . LV dysfunction   . Obesity    Past Surgical History:  Procedure Laterality Date  . AV FISTULA PLACEMENT Left 04/10/2017   Procedure: ARTERIOVENOUS (AV) FISTULA CREATION LEFT ARM;  Surgeon: Conrad Appleton, MD;  Location: [place];  Service: Vascular;  Laterality: Left;  . ESOPHAGOGASTRODUODENOSCOPY (EGD) WITH PROPOFOL N/A 04/04/2017   Procedure: ESOPHAGOGASTRODUODENOSCOPY (EGD) WITH PROPOFOL;  Surgeon: Carol Ada, MD;  Location: [place];  Service: Endoscopy;  Laterality: N/A;  . FISTULA SUPERFICIALIZATION Left 06/22/2019   Procedure: 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 Primary repair.;   Surgeon: Angelia Mould, MD;  Location: [facility] OR;  Service: Vascular;  Laterality: Left;  . INSERTION OF DIALYSIS CATHETER Right 04/10/2017   Procedure: INSERTION OF DIALYSIS CATHETER - RIGHT INTERNAL JUGULAR PLACEMENT;  Surgeon: Conrad Chalkyitsik, MD;  Location: [place];  Service: Vascular;  Laterality: Right;  . TONSILLECTOMY AND ADENOIDECTOMY      Family History  Problem Relation Age of Onset  . Hypertension Mother   . Diabetes Mother   . Hypertension Father     SOCIAL HISTORY: Social History   Socioeconomic History  . Marital status: Married    Spouse name: Not on file  . Number of children: Not on file  . Years of education: Not on file  . Highest education level: Not on file  Occupational History  . Not on file  Tobacco Use  . Smoking status: Never Smoker  . Smokeless tobacco: Never Used  Substance and Sexual Activity  . Alcohol use: Not Currently  . Drug use: No  . Sexual activity: Yes  Other Topics Concern  . Not on file  Social History Narrative   He is married with step kids. He works at [employer].    Social Determinants of Health   Financial Resource Strain:   . Difficulty of Paying Living Expenses: Not on file  Food Insecurity:   . Worried About Charity fundraiser in the Last Year: Not on file  . Ran Out of Food in the Last Year: Not on file  Transportation Needs:   .  Lack of Transportation (Medical): Not on file  . Lack of Transportation (Non-Medical): Not on file  Physical Activity:   . Days of Exercise per Week: Not on file  . Minutes of Exercise per Session: Not on file  Stress:   . Feeling of Stress : Not on file  Social Connections:   . Frequency of Communication with Friends and Family: Not on file  . Frequency of Social Gatherings with Friends and Family: Not on file  . Attends Religious Services: Not on file  . Active Member of Clubs or Organizations: Not on file  . Attends Archivist Meetings: Not on file  . Marital  Status: Not on file  Intimate Partner Violence:   . Fear of Current or Ex-Partner: Not on file  . Emotionally Abused: Not on file  . Physically Abused: Not on file  . Sexually Abused: Not on file    No Known Allergies  No current facility-administered medications for this encounter.   Current Outpatient Medications  Medication Sig Dispense Refill  . amLODipine (NORVASC) 10 MG tablet Take 1 tablet (10 mg total) by mouth daily. 30 tablet 5  . carvedilol (COREG) 6.25 MG tablet Take 6.25 mg by mouth 2 (two) times daily with a meal.    . lovastatin (MEVACOR) 20 MG tablet Take 1 tablet (20 mg total) by mouth at bedtime. 30 tablet 3  . Accu-Chek FastClix Lancets MISC Use as instructed to check blood sugar once daily. E11.9 102 each 11  . Blood Glucose Monitoring Suppl (ACCU-CHEK GUIDE ME) w/Device KIT 1 kit by Does not apply route daily. Use as instructed to check blood sugar once daily. E11.9 1 kit 0  . carvedilol (COREG) 3.125 MG tablet TAKE 1 TABLET (3.125 MG TOTAL) BY MOUTH 2 (TWO) TIMES DAILY WITH A MEAL. (Patient not taking: Reported on 09/26/2019) 60 tablet 2  . glucose blood (ACCU-CHEK GUIDE) test strip Use as instructed to check blood sugar once daily. E11.9 100 each 12    ROS:   General:  No weight loss, Fever, chills  HEENT: No recent headaches, no nasal bleeding, no visual changes, no sore throat  Neurologic: No dizziness, blackouts, seizures. No recent symptoms of stroke or mini- stroke. No recent episodes of slurred speech, or temporary blindness.  Cardiac: No recent episodes of chest pain/pressure, no shortness of breath at rest.  No shortness of breath with exertion.  Denies history of atrial fibrillation or irregular heartbeat  Vascular: No history of rest pain in feet.  No history of claudication.  No history of non-healing ulcer, No history of DVT   Pulmonary: No home oxygen, no productive cough, no hemoptysis,  No asthma or wheezing  Musculoskeletal:  '[ ]'  Arthritis, [  ] Low back pain,  '[ ]'  Joint pain  Hematologic:No history of hypercoagulable state.  No history of easy bleeding.  No history of anemia  Gastrointestinal: No hematochezia or melena,  No gastroesophageal reflux, no trouble swallowing  Urinary: '[X]'  chronic Kidney disease, '[X]'  on HD - '[X]'  MWF or '[ ]'  TTHS, '[ ]'  Burning with urination, '[ ]'  Frequent urination, '[ ]'  Difficulty urinating;   Skin: No rashes  Psychological: No history of anxiety,  No history of depression   Physical Examination  Vitals:   09/26/19 0315 09/26/19 0400 09/26/19 0500 09/26/19 0600  BP: (!) 167/114  (!) 172/115   Pulse: 94 89 98 90  Resp: '12 10 17 12  ' SpO2: 100% 100% 100% 99%    There is  no height or weight on file to calculate BMI.  General:  Alert and oriented, no acute distress HEENT: Normal Neck: No JVD Pulmonary: Clear to auscultation bilaterally Cardiac: Regular Rate and Rhythm  Abdomen: Soft, non-tender, non-distended,obese Skin: No rash, very thinned out skin over the proximal third of the left arm AV fistula encompassing an area of about 7 cm.  The skin is necrotic.  It is probably less than 1 mm thickness.  Pulsatility from the AV fistula can be seen through this.  Distal half of the AV fistula is about 8 mm in diameter and very tortuous. Extremity Pulses:  2+ radial, brachial pulse Musculoskeletal: No deformity or edema  Neurologic: Upper and lower extremity motor 5/5 and symmetric  DATA:  CBC    Component Value Date/Time   WBC 9.0 06/10/2019 1214   WBC 7.5 12/31/2017 1819   RBC 3.07 (L) 06/10/2019 1214   RBC 4.38 12/31/2017 1819   HGB 8.5 (L) 06/22/2019 1042   HGB 9.6 (L) 06/10/2019 1214   HCT 25.0 (L) 06/22/2019 1042   HCT 27.8 (L) 06/10/2019 1214   PLT 227 06/10/2019 1214   MCV 91 06/10/2019 1214   MCH 31.3 06/10/2019 1214   MCH 30.4 12/31/2017 1819   MCHC 34.5 06/10/2019 1214   MCHC 33.8 12/31/2017 1819   RDW 14.5 06/10/2019 1214   LYMPHSABS 1.0 12/21/2013 0510   MONOABS 0.1  12/21/2013 0510   EOSABS 0.0 12/21/2013 0510   BASOSABS 0.0 12/21/2013 0510    BMET    Component Value Date/Time   NA 137 06/22/2019 1042   NA 134 03/18/2017 0942   K 5.0 06/22/2019 1042   CL 97 (L) 06/22/2019 1042   CO2 27 12/31/2017 1819   GLUCOSE 98 06/22/2019 1042   BUN 113 (H) 06/22/2019 1042   BUN 135 (HH) 03/18/2017 0942   CREATININE >18.00 (H) 06/22/2019 1042   CREATININE 3.16 (H) 01/05/2014 1459   CALCIUM 8.6 (L) 12/31/2017 1819   CALCIUM 7.4 (L) 03/01/2017 0752   GFRNONAA 8 (L) 12/31/2017 1819   GFRAA 9 (L) 12/31/2017 1819     ASSESSMENT: Aneurysmal degeneration left arm AV fistula at high risk for bleeding.  I discussed with the patient revision of the fistula but also discussed with him that most likely this fistula is not salvageable.  I discussed with him the possibility of revising the fistula ligating the fistula and placing a tunneled dialysis catheter today.  He was consenting to revision or ligation of the fistula but did not want a catheter placed today.  I discussed with him at length that he would not be able to get hemodialysis tomorrow without a catheter most likely.  He states that he is willing to discuss this again tomorrow.   PLAN: Revision left arm AV fistula possible ligation today in the operating room emergently.  Patient will be taken to the operating room as soon as we are aware of his rapid Covid test result which is pending  I spoke with the patient's wife at [phone] and updated her on the current situation and the plan to go to the operating room.  I will update her postoperatively.  Ruta Hinds, MD [facility] [place] Office: [place], MD [facility] [place] Office: [phone] Pager: [phone]

## 2019-09-27 ENCOUNTER — Other Ambulatory Visit: Payer: Self-pay | Admitting: Physician Assistant

## 2019-09-27 DIAGNOSIS — I770 Arteriovenous fistula, acquired: Secondary | ICD-10-CM | POA: Diagnosis not present

## 2019-09-27 DIAGNOSIS — T82590A Other mechanical complication of surgically created arteriovenous fistula, initial encounter: Secondary | ICD-10-CM | POA: Diagnosis not present

## 2019-09-27 DIAGNOSIS — N186 End stage renal disease: Secondary | ICD-10-CM | POA: Diagnosis not present

## 2019-09-27 DIAGNOSIS — Z992 Dependence on renal dialysis: Secondary | ICD-10-CM | POA: Diagnosis not present

## 2019-09-27 DIAGNOSIS — E875 Hyperkalemia: Secondary | ICD-10-CM | POA: Diagnosis not present

## 2019-09-27 DIAGNOSIS — I96 Gangrene, not elsewhere classified: Secondary | ICD-10-CM | POA: Diagnosis not present

## 2019-09-27 DIAGNOSIS — E1152 Type 2 diabetes mellitus with diabetic peripheral angiopathy with gangrene: Secondary | ICD-10-CM | POA: Diagnosis not present

## 2019-09-27 LAB — BASIC METABOLIC PANEL
Anion gap: 21 — ABNORMAL HIGH (ref 5–15)
BUN: 126 mg/dL — ABNORMAL HIGH (ref 6–20)
CO2: 18 mmol/L — ABNORMAL LOW (ref 22–32)
Calcium: 7.4 mg/dL — ABNORMAL LOW (ref 8.9–10.3)
Chloride: 97 mmol/L — ABNORMAL LOW (ref 98–111)
Creatinine, Ser: 31.58 mg/dL — ABNORMAL HIGH (ref 0.61–1.24)
GFR calc Af Amer: 2 mL/min — ABNORMAL LOW (ref >60)
GFR calc non Af Amer: 1 mL/min — ABNORMAL LOW (ref >60)
Glucose, Bld: 152 mg/dL — ABNORMAL HIGH (ref 70–99)
Potassium: >7.5 mmol/L (ref 3.5–5.1)
Sodium: 136 mmol/L (ref 135–145)

## 2019-09-27 LAB — POTASSIUM: Potassium: 6 mmol/L — ABNORMAL HIGH (ref 3.5–5.1)

## 2019-09-27 LAB — GLUCOSE, CAPILLARY
Glucose-Capillary: 139 mg/dL — ABNORMAL HIGH (ref 70–99)
Glucose-Capillary: 96 mg/dL (ref 70–99)

## 2019-09-27 LAB — GLUCOSE, RANDOM: Glucose, Bld: 101 mg/dL — ABNORMAL HIGH (ref 70–99)

## 2019-09-27 MED ORDER — SODIUM BICARBONATE 8.4 % IV SOLN
50.0000 meq | Freq: Once | INTRAVENOUS | Status: AC
  Administered 2019-09-27: 09:00:00 50 meq via INTRAVENOUS
  Filled 2019-09-27: qty 50

## 2019-09-27 MED ORDER — CALCIUM GLUCONATE-NACL 1-0.675 GM/50ML-% IV SOLN
1.0000 g | Freq: Once | INTRAVENOUS | Status: AC
  Administered 2019-09-27: 07:00:00 1000 mg via INTRAVENOUS
  Filled 2019-09-27: qty 50

## 2019-09-27 MED ORDER — OXYCODONE-ACETAMINOPHEN 5-325 MG PO TABS: 1.0000 | ORAL_TABLET | Freq: Four times a day (QID) | ORAL | 0 refills | Status: DC | PRN

## 2019-09-27 MED ORDER — DEXTROSE 50 % IV SOLN
1.0000 | Freq: Once | INTRAVENOUS | Status: AC
  Administered 2019-09-27: 50 mL via INTRAVENOUS
  Filled 2019-09-27: qty 50

## 2019-09-27 MED ORDER — INSULIN ASPART 100 UNIT/ML IV SOLN
10.0000 [IU] | Freq: Once | INTRAVENOUS | Status: AC
  Administered 2019-09-27: 06:00:00 10 [IU] via INTRAVENOUS

## 2019-09-27 MED ORDER — ALBUTEROL SULFATE (2.5 MG/3ML) 0.083% IN NEBU
10.0000 mg | INHALATION_SOLUTION | Freq: Once | RESPIRATORY_TRACT | Status: AC
  Administered 2019-09-27: 11:00:00 10 mg via RESPIRATORY_TRACT
  Filled 2019-09-27: qty 12

## 2019-09-27 MED ORDER — CALCIUM GLUCONATE 10 % IV SOLN: 1.0000 g | Freq: Once | INTRAVENOUS | Status: DC

## 2019-09-27 MED ORDER — SODIUM POLYSTYRENE SULFONATE 15 GM/60ML PO SUSP
40.0000 g | Freq: Once | ORAL | Status: AC
  Administered 2019-09-27: 06:00:00 40 g via ORAL
  Filled 2019-09-27: qty 180

## 2019-09-27 MED ORDER — DEXTROSE 50 % IV SOLN: 1.0000 | Freq: Once | INTRAVENOUS | Status: DC

## 2019-09-27 MED ORDER — INSULIN ASPART 100 UNIT/ML IV SOLN: 5.0000 [IU] | Freq: Once | INTRAVENOUS | Status: DC

## 2019-09-27 MED ORDER — SODIUM ZIRCONIUM CYCLOSILICATE 10 G PO PACK
10.0000 g | PACK | ORAL | Status: AC
  Administered 2019-09-27: 07:00:00 10 g via ORAL
  Filled 2019-09-27: qty 1

## 2019-09-27 NOTE — Progress Notes (Signed)
CRITICAL VALUE ALERT  Critical Value:  K+ >7.5  Date & Time Notied: 09/27/19 0508  Provider Notified: Oneida Alar  Orders Received/Actions taken: EKG; 40g Kayaxalate; nephrology on call paged to Dr. Oneida Alar

## 2019-09-27 NOTE — Progress Notes (Signed)
Patient given D50; 10 units insulin; I gram calcium gluconate; 40 Kayexalate; lokelma

## 2019-09-27 NOTE — Progress Notes (Signed)
Pt admitted after fistula rupture requiring ligation.  Received a call from Dr. Oneida Alar this morning patient had refused dialysis catheter placement at the time of surgery.  This morning labs show K > 7.5 which has been medically managed to temporize.   I called the patient via his floor RN this morning.  He continues to clearly state he does not desire dialysis catheter placement and is aware that untreated his condition will be fatal.  He is unable to verbalize a particular reason except stating "personal reason."  He tells me he has had a prior dialysis catheter and understands the process but does not wish to proceed.  He does not have another plan of how to receive dialysis treatments but will consider his option.    I clearly stated to him that should he sustain a cardiac arrest he will likely die by forgoing the treatment options we recommend and he is willing to accept this.   He tells me he has spoken with his wife and I recommended he call her now to continue the conversation.

## 2019-09-27 NOTE — Progress Notes (Signed)
Dose of sodium bicarb IV push administered. Pt stated he still has not made a decision about cath placement and dialysis. Pt aware that we are treating is critical labs right now, but is aware of the consequences of not getting dialysis.

## 2019-09-27 NOTE — Progress Notes (Signed)
  Progress Note    09/27/2019 7:34 AM 1 Day Post-Op  Subjective: Status post ligation left upper arm AV fistula with excision of ulcerated skin over aneurysmal dilatation.  Nephrology's note reviewed.  The patient is currently on the phone with his wife discussing treatment.  Postoperative pain is controlled.  He is elevating his left arm on 2 pillows.    Vitals:   09/26/19 2100 09/27/19 0443  BP: (!) 148/99 (!) 160/100  Pulse: 81 95  Resp: 12 14  Temp: 98 F (36.7 C) 97.6 F (36.4 C)  SpO2: 96% 99%    Physical Exam: Cardiac: Regular rhythm and rate Lungs: There to auscultation bilaterally Incisions: Incision is well approximated Extremities: Left upper extremity with mild edema.  2+ radial pulse  CBC    Component Value Date/Time   WBC 7.2 09/26/2019 0731   RBC 2.53 (L) 09/26/2019 0731   HGB 7.2 (L) 09/26/2019 0731   HGB 9.6 (L) 06/10/2019 1214   HCT 23.1 (L) 09/26/2019 0731   HCT 27.8 (L) 06/10/2019 1214   PLT 214 09/26/2019 0731   PLT 227 06/10/2019 1214   MCV 91.3 09/26/2019 0731   MCV 91 06/10/2019 1214   MCH 28.5 09/26/2019 0731   MCHC 31.2 09/26/2019 0731   RDW 15.5 09/26/2019 0731   RDW 14.5 06/10/2019 1214   LYMPHSABS 1.0 12/21/2013 0510   MONOABS 0.1 12/21/2013 0510   EOSABS 0.0 12/21/2013 0510   BASOSABS 0.0 12/21/2013 0510    BMET    Component Value Date/Time   NA 136 09/27/2019 0319   NA 134 03/18/2017 0942   K >7.5 (HH) 09/27/2019 0319   CL 97 (L) 09/27/2019 0319   CO2 18 (L) 09/27/2019 0319   GLUCOSE 152 (H) 09/27/2019 0319   BUN 126 (H) 09/27/2019 0319   BUN 135 (HH) 03/18/2017 0942   CREATININE 31.58 (H) 09/27/2019 0319   CREATININE 3.16 (H) 01/05/2014 1459   CALCIUM 7.4 (L) 09/27/2019 0319   CALCIUM 7.4 (L) 03/01/2017 0752   GFRNONAA 1 (L) 09/27/2019 0319   GFRAA 2 (L) 09/27/2019 0319     Intake/Output Summary (Last 24 hours) at 09/27/2019 0734 Last data filed at 09/27/2019 0524 Gross per 24 hour  Intake 600 ml  Output 300 ml    Net 300 ml    HOSPITAL MEDICATIONS Scheduled Meds: . amLODipine  10 mg Oral Daily  . carvedilol  6.25 mg Oral BID WC  . heparin  5,000 Units Subcutaneous Q8H  . insulin aspart  0-6 Units Subcutaneous TID WC  . pantoprazole  40 mg Oral Daily  . pravastatin  20 mg Oral q1800   Continuous Infusions: . calcium gluconate 1,000 mg (09/27/19 0643)   PRN Meds:.acetaminophen **OR** acetaminophen, alum & mag hydroxide-simeth, bisacodyl, guaiFENesin-dextromethorphan, hydrALAZINE, labetalol, metoprolol tartrate, morphine injection, ondansetron, oxyCODONE-acetaminophen, phenol, senna-docusate  Assessment:  39 y.o. male is s/p:  Ligation of left upper arm AV fistula with excision of aneurysm and ulcerated skin.  The patient is currently refusing hemodialysis at the present time.  He is aware of the implications of this.  Hgb 7.2.  K+7.5 1 Day Post-Op  Plan: -He is currently n.p.o. in anticipation of placing TDC if he consents. Nephrology medically treating hyperkalemia. Continue elevation LUE -DVT prophylaxis: SCDs   Risa Grill, PA-C [facility] [phone] 09/27/2019  7:34 AM

## 2019-09-27 NOTE — Progress Notes (Signed)
Pt discharging home. NT did routine vitals since pt is still here and found BP 191/105. Pt's IV has been removed for discharge. PA made aware. Pt will still be discharged as planned.

## 2019-09-27 NOTE — Care Management CC44 (Signed)
Condition Code 44 Documentation Completed  Patient Details  Name: MARWAN KISSELL MRN: [id number] Date of Birth: 05/28/1981   Condition Code 44 given:  Yes Patient signature on Condition Code 44 notice:  Yes Documentation of 2 MD's agreement:  Yes Code 44 added to claim:  Yes    Dawayne Patricia, RN 09/27/2019, 12:44 PM

## 2019-09-27 NOTE — Discharge Summary (Signed)
  Discharge Summary  Patient ID: Patrick Ibarra [id number] 39 y.o. 10/11/1980  Admit date: 09/26/2019  Discharge date and time:  09/27/19  Admitting Physician: Elam Dutch, MD   Discharge Physician: same  Admission Diagnoses: Malfunction of arteriovenous dialysis fistula, initial encounter [facility]) [T82.590A] Arteriovenous fistula for hemodialysis in place, primary [facility]) [Z99.2] Hyperkalemia  Discharge Diagnoses: same  Admission Condition: fair  Discharged Condition: poor  Indication for Admission: Tissue loss overlying AV fistula  Hospital Course: [name] is a 39y.o male who presented to the emergency department with necrotic ulceration overlying left upper arm AV fistula.  He was brought urgently to the operating room and underwent ligation of left arm AV fistula with excision of fistula aneurysm by Dr. Oneida Alar on 09/26/2019.  He tolerated this procedure well and was admitted to the hospital postoperatively.  POD #1 morning draw labs demonstrate potassium of greater than 7.5.  It has been well documented that patient has been delaying/refusing new access placement for some time now.  Now that dialysis access has been ligated patient is now refusing catheter placement.  Patient is aware of the ramifications if he does not receive dialysis or treatment for hyperkalemia.  Nephrology has also been consulted and have also discussed the need for treatment for hyperkalem and to continue hemodialysis.  Patient again expressed his choice along with his wife to refuse any further hemodialysis treatment.  Patient also declined palliative care as well as home hospice.  Plan is for discharge home this morning.  Consults: nephrology  Treatments: surgery: Ligation of left arm AV fistula with excision of fistula aneurysm by Dr. Oneida Alar on 09/26/19  Discharge Exam: see progress note 09/27/19 Vitals:   09/27/19 0850 09/27/19 0943  BP: (!) 165/102   Pulse: 90 (!) 104  Resp: 11 13  Temp:      SpO2:      Disposition: Discharge disposition: 01-Home or Self Care       Patient Instructions:  Allergies as of 09/27/2019   No Known Allergies     Medication List    TAKE these medications   Accu-Chek FastClix Lancets Misc Use as instructed to check blood sugar once daily. E11.9   Accu-Chek Guide Me w/Device Kit 1 kit by Does not apply route daily. Use as instructed to check blood sugar once daily. E11.9   Accu-Chek Guide test strip Generic drug: glucose blood Use as instructed to check blood sugar once daily. E11.9   amLODipine 10 MG tablet Commonly known as: NORVASC Take 1 tablet (10 mg total) by mouth daily.   carvedilol 6.25 MG tablet Commonly known as: COREG Take 6.25 mg by mouth 2 (two) times daily with a meal.   carvedilol 3.125 MG tablet Commonly known as: COREG TAKE 1 TABLET (3.125 MG TOTAL) BY MOUTH 2 (TWO) TIMES DAILY WITH A MEAL.   lovastatin 20 MG tablet Commonly known as: MEVACOR Take 1 tablet (20 mg total) by mouth at bedtime.   oxyCODONE-acetaminophen 5-325 MG tablet Commonly known as: PERCOCET/ROXICET Take 1 tablet by mouth every 6 (six) hours as needed for moderate pain.      Activity: activity as tolerated Diet: regular diet Wound Care: keep wound clean and dry  Signed: Dagoberto Ligas, PA-C 09/27/2019 10:08 AM [facility] Office: [phone]

## 2019-09-27 NOTE — Care Management Obs Status (Signed)
Oneida NOTIFICATION   Patient Details  Name: ELDRIC JOACHIM MRN: [id number] Date of Birth: 31-May-1981   Medicare Observation Status Notification Given:  Yes    Dawayne Patricia, RN 09/27/2019, 12:43 PM

## 2019-09-27 NOTE — Consult Note (Addendum)
Renal Service Consult Note [place] 09/27/2019 Sol Blazing Requesting Physician:  Dr Oneida Alar  Reason for Consult:  ESRD pt w/ hyperkalemia HPI: The patient is a 39 y.o. year-old with hx of obestiy, HTN, LV dysfunction, DM2 and ESRD on HD.  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  Vincenza Hews was admitted and pt underwent excision of aneurysm w/ ligation of the L AVF. Pt refused having any HD catheter placement. 3 mos ago he had a prior revision to the AVF and at that time was recommended to have a R arm AVF placed but pt declined. Pt met w/ another [facility] doctor in Oct 2020 and again declined recommended R arm AVF placement.  Now pt is w/o HD access given he has refused to have new AVF placed, the old AVF is now ligated and he has refused HD cath placement. His last OP dialysis was on Sep 15, 2019 (12 days ago).  Asked to see for ESRD.    Pt is calm and states he does not want HD cath placement.  I explained the high likelihood that patient will succumb from resp or cardiac arrest from hyperkalemia if not dialyzed in the short term, or from uremia if not dialyzed over several weeks/months.  Pt said he is aware. I offered palliative care consult which pt politely declined saying that he has "talked w/ them before".  I offered home hospice which he also declined.  With pt's consent I spoke w/ pt's wife who acknowledged her husband's decisions and supports him, saying that they have spoken to several clergy members about this and they are both comfortable with his decisions and the potential ramifications.    Patient declines any CP, SOB, abd pain, no cough ,chills or fevers.       ROS  denies CP  no joint pain   no HA  no blurry vision  no rash  no diarrhea  no nausea/ vomiting  no dysuria  no difficulty voiding  no change in urine color    Past Medical History  Past Medical History:  Diagnosis Date  . Chronic kidney disease     dialysis M-W-F  . Diabetes mellitus without complication (HCC)    type 2, diet controlled  . Hypertension   . LV dysfunction   . Obesity    Past Surgical History  Past Surgical History:  Procedure Laterality Date  . AV FISTULA PLACEMENT Left 04/10/2017   Procedure: ARTERIOVENOUS (AV) FISTULA CREATION LEFT ARM;  Surgeon: Conrad Felicity, MD;  Location: [place];  Service: Vascular;  Laterality: Left;  . ESOPHAGOGASTRODUODENOSCOPY (EGD) WITH PROPOFOL N/A 04/04/2017   Procedure: ESOPHAGOGASTRODUODENOSCOPY (EGD) WITH PROPOFOL;  Surgeon: Carol Ada, MD;  Location: [place];  Service: Endoscopy;  Laterality: N/A;  . FISTULA SUPERFICIALIZATION Left 06/22/2019   Procedure: 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 Primary repair.;  Surgeon: Angelia Mould, MD;  Location: [facility] OR;  Service: Vascular;  Laterality: Left;  . INSERTION OF DIALYSIS CATHETER Right 04/10/2017   Procedure: INSERTION OF DIALYSIS CATHETER - RIGHT INTERNAL JUGULAR PLACEMENT;  Surgeon: Conrad Hillsdale, MD;  Location: [place];  Service: Vascular;  Laterality: Right;  . THROMBECTOMY AND REVISION OF ARTERIOVENTOUS (AV) GORETEX  GRAFT Left 09/26/2019   Procedure: Ligation left arm AV fistula with excision of left arm AV fistula aneurysm;  Surgeon: Elam Dutch, MD;  Location: [facility] OR;  Service: Vascular;  Laterality: Left;  .  TONSILLECTOMY AND ADENOIDECTOMY     Family History  Family History  Problem Relation Age of Onset  . Hypertension Mother   . Diabetes Mother   . Hypertension Father    Social History  reports that he has never smoked. He has never used smokeless tobacco. He reports previous alcohol use. He reports that he does not use drugs. Allergies No Known Allergies Home medications Prior to Admission medications   Medication Sig Start Date End Date Taking? Authorizing Provider  amLODipine (NORVASC) 10 MG tablet Take 1 tablet (10 mg total) by mouth daily. 06/10/19  Yes Ladell Pier, MD   carvedilol (COREG) 6.25 MG tablet Take 6.25 mg by mouth 2 (two) times daily with a meal.   Yes [provider]  lovastatin (MEVACOR) 20 MG tablet Take 1 tablet (20 mg total) by mouth at bedtime. 06/12/19  Yes Ladell Pier, MD  Accu-Chek FastClix Lancets MISC Use as instructed to check blood sugar once daily. E11.9 06/17/19   Ladell Pier, MD  Blood Glucose Monitoring Suppl (ACCU-CHEK GUIDE ME) w/Device KIT 1 kit by Does not apply route daily. Use as instructed to check blood sugar once daily. E11.9 06/17/19   Ladell Pier, MD  carvedilol (COREG) 3.125 MG tablet TAKE 1 TABLET (3.125 MG TOTAL) BY MOUTH 2 (TWO) TIMES DAILY WITH A MEAL. Patient not taking: Reported on 09/26/2019 10/01/18   Ladell Pier, MD  glucose blood (ACCU-CHEK GUIDE) test strip Use as instructed to check blood sugar once daily. E11.9 06/17/19   Ladell Pier, MD   Liver Function Tests No results for input(s): AST, ALT, ALKPHOS, BILITOT, PROT, ALBUMIN in the last 168 hours. No results for input(s): LIPASE, AMYLASE in the last 168 hours. CBC Recent Labs  Lab 09/26/19 0731  WBC 7.2  HGB 7.2*  HCT 23.1*  MCV 91.3  PLT 297   Basic Metabolic Panel Recent Labs  Lab 09/26/19 0731 09/27/19 0319  NA 137 136  K 5.8* >7.5*  CL 96* 97*  CO2 18* 18*  GLUCOSE 118* 152*  BUN 121* 126*  CREATININE 30.96* 31.58*  CALCIUM 7.5* 7.4*   Iron/TIBC/Ferritin/ %Sat    Component Value Date/Time   IRON 27 (L) 03/04/2017 0551   TIBC 245 (L) 03/04/2017 0551   FERRITIN 119 03/04/2017 0551   IRONPCTSAT 11 (L) 03/04/2017 0551    Vitals:   09/26/19 1700 09/26/19 2100 09/27/19 0443 09/27/19 0850  BP: (!) 155/92 (!) 148/99 (!) 160/100 (!) 165/102  Pulse: 80 81 95 90  Resp: '13 12 14 11  ' Temp:  98 F (36.7 C) 97.6 F (36.4 C)   TempSrc:  Oral Oral   SpO2: 97% 96% 99%   Weight:   112.2 kg   Height:        Exam Gen alert, no distress and calm No rash, cyanosis or gangrene Sclera anicteric,  throat clear No jvd or bruit Chest clear bilat RRR no MRG Abd soft ntnd no mass or ascites +bs GU defer MS no joint effusions or deformity Ext no LE  edema, no wounds or ulcers Neuro is alert, Ox 3 , nf, pleasant      Assessment/ Plan: 1. ESRD - pt declines having new access placed, and has been declining this for several months now.  Pt has been advised ,as is his family, that he will likely succumb in the near future to complications of uremia/ hyperkalemia without dialysis.  Pt's wife supports his decisions regarding these matters. Both  pt and his wife declined offers for hospice/ palliative care assistance. Have d/w [facility], they will dc later today.  2. Hyperkalemia - was treated acutely this am w/ IV Ca, ins/ glu, bicarb/ albuterol, kayexalate and lokelma for K> 7.5.       Kelly Splinter  MD 09/27/2019, 9:38 AM

## 2019-09-28 ENCOUNTER — Ambulatory Visit (HOSPITAL_COMMUNITY): Admission: RE | Admit: 2019-09-28 | Payer: Medicare Other | Source: Home / Self Care | Admitting: Vascular Surgery

## 2019-09-28 SURGERY — LIGATION OF ARTERIOVENOUS  FISTULA
Anesthesia: Choice | Laterality: Right

## 2019-09-29 ENCOUNTER — Encounter: Payer: Self-pay | Admitting: *Deleted

## 2019-10-01 ENCOUNTER — Inpatient Hospital Stay (HOSPITAL_COMMUNITY): Payer: Medicare Other

## 2019-10-01 ENCOUNTER — Other Ambulatory Visit: Payer: Self-pay

## 2019-10-01 ENCOUNTER — Inpatient Hospital Stay (HOSPITAL_COMMUNITY)
Admission: EM | Admit: 2019-10-01 | Discharge: 2019-10-05 | DRG: 189 | Discharge status: Against Medical Advice | Payer: Medicare Other | Attending: Internal Medicine | Admitting: Internal Medicine

## 2019-10-01 ENCOUNTER — Emergency Department (HOSPITAL_COMMUNITY): Payer: Medicare Other

## 2019-10-01 ENCOUNTER — Encounter (HOSPITAL_COMMUNITY): Payer: Self-pay

## 2019-10-01 DIAGNOSIS — Z79899 Other long term (current) drug therapy: Secondary | ICD-10-CM | POA: Diagnosis not present

## 2019-10-01 DIAGNOSIS — Z7189 Other specified counseling: Secondary | ICD-10-CM

## 2019-10-01 DIAGNOSIS — Z20822 Contact with and (suspected) exposure to covid-19: Secondary | ICD-10-CM | POA: Diagnosis present

## 2019-10-01 DIAGNOSIS — I161 Hypertensive emergency: Secondary | ICD-10-CM | POA: Diagnosis present

## 2019-10-01 DIAGNOSIS — I10 Essential (primary) hypertension: Secondary | ICD-10-CM | POA: Diagnosis not present

## 2019-10-01 DIAGNOSIS — Z9119 Patient's noncompliance with other medical treatment and regimen: Secondary | ICD-10-CM

## 2019-10-01 DIAGNOSIS — E669 Obesity, unspecified: Secondary | ICD-10-CM | POA: Diagnosis present

## 2019-10-01 DIAGNOSIS — Z8249 Family history of ischemic heart disease and other diseases of the circulatory system: Secondary | ICD-10-CM | POA: Diagnosis not present

## 2019-10-01 DIAGNOSIS — Z79891 Long term (current) use of opiate analgesic: Secondary | ICD-10-CM

## 2019-10-01 DIAGNOSIS — J810 Acute pulmonary edema: Secondary | ICD-10-CM | POA: Diagnosis not present

## 2019-10-01 DIAGNOSIS — J189 Pneumonia, unspecified organism: Secondary | ICD-10-CM

## 2019-10-01 DIAGNOSIS — Z452 Encounter for adjustment and management of vascular access device: Secondary | ICD-10-CM | POA: Diagnosis not present

## 2019-10-01 DIAGNOSIS — Z5329 Procedure and treatment not carried out because of patient's decision for other reasons: Secondary | ICD-10-CM

## 2019-10-01 DIAGNOSIS — R0902 Hypoxemia: Secondary | ICD-10-CM | POA: Diagnosis not present

## 2019-10-01 DIAGNOSIS — E1122 Type 2 diabetes mellitus with diabetic chronic kidney disease: Secondary | ICD-10-CM | POA: Diagnosis present

## 2019-10-01 DIAGNOSIS — J9601 Acute respiratory failure with hypoxia: Secondary | ICD-10-CM | POA: Diagnosis not present

## 2019-10-01 DIAGNOSIS — I120 Hypertensive chronic kidney disease with stage 5 chronic kidney disease or end stage renal disease: Secondary | ICD-10-CM | POA: Diagnosis not present

## 2019-10-01 DIAGNOSIS — J811 Chronic pulmonary edema: Secondary | ICD-10-CM | POA: Diagnosis present

## 2019-10-01 DIAGNOSIS — Z833 Family history of diabetes mellitus: Secondary | ICD-10-CM

## 2019-10-01 DIAGNOSIS — Z91199 Patient's noncompliance with other medical treatment and regimen due to unspecified reason: Secondary | ICD-10-CM

## 2019-10-01 DIAGNOSIS — E872 Acidosis: Secondary | ICD-10-CM | POA: Diagnosis present

## 2019-10-01 DIAGNOSIS — D631 Anemia in chronic kidney disease: Secondary | ICD-10-CM | POA: Diagnosis not present

## 2019-10-01 DIAGNOSIS — N186 End stage renal disease: Secondary | ICD-10-CM | POA: Diagnosis present

## 2019-10-01 DIAGNOSIS — Z992 Dependence on renal dialysis: Secondary | ICD-10-CM

## 2019-10-01 DIAGNOSIS — R0689 Other abnormalities of breathing: Secondary | ICD-10-CM | POA: Diagnosis not present

## 2019-10-01 DIAGNOSIS — Z9115 Patient's noncompliance with renal dialysis: Secondary | ICD-10-CM | POA: Diagnosis not present

## 2019-10-01 DIAGNOSIS — Z6830 Body mass index (BMI) 30.0-30.9, adult: Secondary | ICD-10-CM

## 2019-10-01 DIAGNOSIS — Z209 Contact with and (suspected) exposure to unspecified communicable disease: Secondary | ICD-10-CM | POA: Diagnosis not present

## 2019-10-01 DIAGNOSIS — R5383 Other fatigue: Secondary | ICD-10-CM

## 2019-10-01 DIAGNOSIS — E8770 Fluid overload, unspecified: Secondary | ICD-10-CM | POA: Diagnosis not present

## 2019-10-01 DIAGNOSIS — E11649 Type 2 diabetes mellitus with hypoglycemia without coma: Secondary | ICD-10-CM | POA: Diagnosis not present

## 2019-10-01 DIAGNOSIS — E8889 Other specified metabolic disorders: Secondary | ICD-10-CM | POA: Diagnosis present

## 2019-10-01 DIAGNOSIS — IMO0002 Reserved for concepts with insufficient information to code with codable children: Secondary | ICD-10-CM | POA: Diagnosis present

## 2019-10-01 DIAGNOSIS — Z01810 Encounter for preprocedural cardiovascular examination: Secondary | ICD-10-CM | POA: Diagnosis not present

## 2019-10-01 DIAGNOSIS — E1165 Type 2 diabetes mellitus with hyperglycemia: Secondary | ICD-10-CM | POA: Diagnosis present

## 2019-10-01 DIAGNOSIS — R0603 Acute respiratory distress: Secondary | ICD-10-CM | POA: Diagnosis not present

## 2019-10-01 DIAGNOSIS — N250 Renal osteodystrophy: Secondary | ICD-10-CM | POA: Diagnosis not present

## 2019-10-01 DIAGNOSIS — D649 Anemia, unspecified: Secondary | ICD-10-CM

## 2019-10-01 DIAGNOSIS — Z515 Encounter for palliative care: Secondary | ICD-10-CM | POA: Diagnosis present

## 2019-10-01 DIAGNOSIS — R0602 Shortness of breath: Secondary | ICD-10-CM | POA: Diagnosis not present

## 2019-10-01 DIAGNOSIS — Z09 Encounter for follow-up examination after completed treatment for conditions other than malignant neoplasm: Secondary | ICD-10-CM

## 2019-10-01 DIAGNOSIS — R918 Other nonspecific abnormal finding of lung field: Secondary | ICD-10-CM | POA: Diagnosis not present

## 2019-10-01 DIAGNOSIS — R000 Tachycardia, unspecified: Secondary | ICD-10-CM | POA: Diagnosis not present

## 2019-10-01 DIAGNOSIS — E875 Hyperkalemia: Secondary | ICD-10-CM | POA: Diagnosis not present

## 2019-10-01 LAB — COMPREHENSIVE METABOLIC PANEL
ALT: 8 U/L (ref 0–44)
AST: 35 U/L (ref 15–41)
Albumin: 2.9 g/dL — ABNORMAL LOW (ref 3.5–5.0)
Alkaline Phosphatase: 39 U/L (ref 38–126)
Anion gap: 25 — ABNORMAL HIGH (ref 5–15)
BUN: 142 mg/dL — ABNORMAL HIGH (ref 6–20)
CO2: 19 mmol/L — ABNORMAL LOW (ref 22–32)
Calcium: 7.7 mg/dL — ABNORMAL LOW (ref 8.9–10.3)
Chloride: 94 mmol/L — ABNORMAL LOW (ref 98–111)
Creatinine, Ser: 34.27 mg/dL — ABNORMAL HIGH (ref 0.61–1.24)
GFR calc Af Amer: 2 mL/min — ABNORMAL LOW (ref >60)
GFR calc non Af Amer: 1 mL/min — ABNORMAL LOW (ref >60)
Glucose, Bld: 134 mg/dL — ABNORMAL HIGH (ref 70–99)
Potassium: 6.7 mmol/L (ref 3.5–5.1)
Sodium: 138 mmol/L (ref 135–145)
Total Bilirubin: 0.3 mg/dL (ref 0.3–1.2)
Total Protein: 7 g/dL (ref 6.5–8.1)

## 2019-10-01 LAB — PROCALCITONIN: Procalcitonin: 3.34 ng/mL

## 2019-10-01 LAB — PROTIME-INR
INR: 1.1 (ref 0.8–1.2)
Prothrombin Time: 14.5 seconds (ref 11.4–15.2)

## 2019-10-01 LAB — POCT I-STAT 7, (LYTES, BLD GAS, ICA,H+H)
Acid-base deficit: 8 mmol/L — ABNORMAL HIGH (ref 0.0–2.0)
Acid-base deficit: 7 mmol/L — ABNORMAL HIGH (ref 0.0–2.0)
Bicarbonate: 17.2 mmol/L — ABNORMAL LOW (ref 20.0–28.0)
Bicarbonate: 19.4 mmol/L — ABNORMAL LOW (ref 20.0–28.0)
Calcium, Ion: 0.93 mmol/L — ABNORMAL LOW (ref 1.15–1.40)
Calcium, Ion: 0.91 mmol/L — ABNORMAL LOW (ref 1.15–1.40)
HCT: 20 % — ABNORMAL LOW (ref 39.0–52.0)
HCT: 36 % — ABNORMAL LOW (ref 39.0–52.0)
Hemoglobin: 6.8 g/dL — CL (ref 13.0–17.0)
Hemoglobin: 12.2 g/dL — ABNORMAL LOW (ref 13.0–17.0)
O2 Saturation: 100 %
O2 Saturation: 92 %
Patient temperature: 97.5
Patient temperature: 98.6
Potassium: 6 mmol/L — ABNORMAL HIGH (ref 3.5–5.1)
Potassium: 7.2 mmol/L (ref 3.5–5.1)
Sodium: 134 mmol/L — ABNORMAL LOW (ref 135–145)
Sodium: 133 mmol/L — ABNORMAL LOW (ref 135–145)
TCO2: 18 mmol/L — ABNORMAL LOW (ref 22–32)
TCO2: 21 mmol/L — ABNORMAL LOW (ref 22–32)
pCO2 arterial: 33.4 mmHg (ref 32.0–48.0)
pCO2 arterial: 40.9 mmHg (ref 32.0–48.0)
pH, Arterial: 7.317 — ABNORMAL LOW (ref 7.350–7.450)
pH, Arterial: 7.284 — ABNORMAL LOW (ref 7.350–7.450)
pO2, Arterial: 180 mmHg — ABNORMAL HIGH (ref 83.0–108.0)
pO2, Arterial: 70 mmHg — ABNORMAL LOW (ref 83.0–108.0)

## 2019-10-01 LAB — BASIC METABOLIC PANEL
Anion gap: 30 — ABNORMAL HIGH (ref 5–15)
BUN: 139 mg/dL — ABNORMAL HIGH (ref 6–20)
CO2: 14 mmol/L — ABNORMAL LOW (ref 22–32)
Calcium: 7.9 mg/dL — ABNORMAL LOW (ref 8.9–10.3)
Chloride: 94 mmol/L — ABNORMAL LOW (ref 98–111)
Creatinine, Ser: 35.76 mg/dL — ABNORMAL HIGH (ref 0.61–1.24)
GFR calc Af Amer: 1 mL/min — ABNORMAL LOW (ref >60)
GFR calc non Af Amer: 1 mL/min — ABNORMAL LOW (ref >60)
Glucose, Bld: 131 mg/dL — ABNORMAL HIGH (ref 70–99)
Potassium: 5.7 mmol/L — ABNORMAL HIGH (ref 3.5–5.1)
Sodium: 138 mmol/L (ref 135–145)

## 2019-10-01 LAB — CBC
HCT: 20.1 % — ABNORMAL LOW (ref 39.0–52.0)
HCT: 19 % — ABNORMAL LOW (ref 39.0–52.0)
HCT: 23.5 % — ABNORMAL LOW (ref 39.0–52.0)
Hemoglobin: 6.5 g/dL — CL (ref 13.0–17.0)
Hemoglobin: 6.1 g/dL — CL (ref 13.0–17.0)
Hemoglobin: 7.7 g/dL — ABNORMAL LOW (ref 13.0–17.0)
MCH: 29 pg (ref 26.0–34.0)
MCH: 29 pg (ref 26.0–34.0)
MCH: 29.3 pg (ref 26.0–34.0)
MCHC: 32.3 g/dL (ref 30.0–36.0)
MCHC: 32.1 g/dL (ref 30.0–36.0)
MCHC: 32.8 g/dL (ref 30.0–36.0)
MCV: 89.7 fL (ref 80.0–100.0)
MCV: 90.5 fL (ref 80.0–100.0)
MCV: 89.4 fL (ref 80.0–100.0)
Platelets: 282 10*3/uL (ref 150–400)
Platelets: 230 10*3/uL (ref 150–400)
Platelets: 275 10*3/uL (ref 150–400)
RBC: 2.24 MIL/uL — ABNORMAL LOW (ref 4.22–5.81)
RBC: 2.1 MIL/uL — ABNORMAL LOW (ref 4.22–5.81)
RBC: 2.63 MIL/uL — ABNORMAL LOW (ref 4.22–5.81)
RDW: 15.9 % — ABNORMAL HIGH (ref 11.5–15.5)
RDW: 15.9 % — ABNORMAL HIGH (ref 11.5–15.5)
RDW: 15.6 % — ABNORMAL HIGH (ref 11.5–15.5)
WBC: 15.6 10*3/uL — ABNORMAL HIGH (ref 4.0–10.5)
WBC: 22.1 10*3/uL — ABNORMAL HIGH (ref 4.0–10.5)
WBC: 20 10*3/uL — ABNORMAL HIGH (ref 4.0–10.5)
nRBC: 0 % (ref 0.0–0.2)
nRBC: 0 % (ref 0.0–0.2)
nRBC: 0 % (ref 0.0–0.2)

## 2019-10-01 LAB — POCT I-STAT EG7
Acid-base deficit: 10 mmol/L — ABNORMAL HIGH (ref 0.0–2.0)
Bicarbonate: 15.2 mmol/L — ABNORMAL LOW (ref 20.0–28.0)
Calcium, Ion: 0.86 mmol/L — CL (ref 1.15–1.40)
HCT: 20 % — ABNORMAL LOW (ref 39.0–52.0)
Hemoglobin: 6.8 g/dL — CL (ref 13.0–17.0)
O2 Saturation: 76 %
Potassium: 5.5 mmol/L — ABNORMAL HIGH (ref 3.5–5.1)
Sodium: 134 mmol/L — ABNORMAL LOW (ref 135–145)
TCO2: 16 mmol/L — ABNORMAL LOW (ref 22–32)
pCO2, Ven: 31.6 mmHg — ABNORMAL LOW (ref 44.0–60.0)
pH, Ven: 7.29 (ref 7.250–7.430)
pO2, Ven: 45 mmHg (ref 32.0–45.0)

## 2019-10-01 LAB — HEPATITIS PANEL, ACUTE
HCV Ab: NONREACTIVE
Hep A IgM: NONREACTIVE
Hep B C IgM: NONREACTIVE
Hepatitis B Surface Ag: NONREACTIVE

## 2019-10-01 LAB — RESPIRATORY PANEL BY RT PCR (FLU A&B, COVID)
Influenza A by PCR: NEGATIVE
Influenza B by PCR: NEGATIVE
SARS Coronavirus 2 by RT PCR: NEGATIVE

## 2019-10-01 LAB — POC OCCULT BLOOD, ED: Fecal Occult Bld: NEGATIVE

## 2019-10-01 LAB — RAPID URINE DRUG SCREEN, HOSP PERFORMED
Amphetamines: NOT DETECTED
Barbiturates: NOT DETECTED
Benzodiazepines: NOT DETECTED
Cocaine: NOT DETECTED
Opiates: NOT DETECTED
Tetrahydrocannabinol: NOT DETECTED

## 2019-10-01 LAB — APTT: aPTT: 32 seconds (ref 24–36)

## 2019-10-01 LAB — PREPARE RBC (CROSSMATCH)

## 2019-10-01 LAB — GLUCOSE, CAPILLARY
Glucose-Capillary: 125 mg/dL — ABNORMAL HIGH (ref 70–99)
Glucose-Capillary: 88 mg/dL (ref 70–99)
Glucose-Capillary: 82 mg/dL (ref 70–99)
Glucose-Capillary: 87 mg/dL (ref 70–99)

## 2019-10-01 LAB — LACTIC ACID, PLASMA
Lactic Acid, Venous: 2.2 mmol/L (ref 0.5–1.9)
Lactic Acid, Venous: 1.5 mmol/L (ref 0.5–1.9)

## 2019-10-01 LAB — STREP PNEUMONIAE URINARY ANTIGEN: Strep Pneumo Urinary Antigen: NEGATIVE

## 2019-10-01 LAB — CORTISOL: Cortisol, Plasma: 35.4 ug/dL

## 2019-10-01 LAB — HEPATITIS B CORE ANTIBODY, TOTAL: Hep B Core Total Ab: NONREACTIVE

## 2019-10-01 LAB — PHOSPHORUS: Phosphorus: >30 mg/dL — ABNORMAL HIGH (ref 2.5–4.6)

## 2019-10-01 LAB — MRSA PCR SCREENING: MRSA by PCR: NEGATIVE

## 2019-10-01 LAB — MAGNESIUM: Magnesium: 2.5 mg/dL — ABNORMAL HIGH (ref 1.7–2.4)

## 2019-10-01 MED ORDER — LIDOCAINE HCL (PF) 1 % IJ SOLN: 5.0000 mL | INTRAMUSCULAR | Status: DC | PRN

## 2019-10-01 MED ORDER — ONDANSETRON HCL 4 MG/2ML IJ SOLN
4.0000 mg | Freq: Once | INTRAMUSCULAR | Status: AC
  Administered 2019-10-01: 4 mg via INTRAVENOUS
  Filled 2019-10-01: qty 2

## 2019-10-01 MED ORDER — HEPARIN SODIUM (PORCINE) 1000 UNIT/ML DIALYSIS
1000.0000 [IU] | INTRAMUSCULAR | Status: DC | PRN
  Filled 2019-10-01: qty 1

## 2019-10-01 MED ORDER — FENTANYL CITRATE (PF) 100 MCG/2ML IJ SOLN
INTRAMUSCULAR | Status: AC
  Filled 2019-10-01: qty 2

## 2019-10-01 MED ORDER — CHLORHEXIDINE GLUCONATE 0.12 % MT SOLN
15.0000 mL | Freq: Two times a day (BID) | OROMUCOSAL | Status: DC
  Administered 2019-10-02 – 2019-10-05 (×5): 15 mL via OROMUCOSAL
  Filled 2019-10-01 (×4): qty 15

## 2019-10-01 MED ORDER — NITROGLYCERIN IN D5W 200-5 MCG/ML-% IV SOLN
0.0000 ug/min | INTRAVENOUS | Status: DC
  Administered 2019-10-01: 5 ug/min via INTRAVENOUS
  Administered 2019-10-01: 25 ug/min via INTRAVENOUS
  Administered 2019-10-02: 130 ug/min via INTRAVENOUS
  Administered 2019-10-02 (×3): 200 ug/min via INTRAVENOUS
  Administered 2019-10-03: 195 ug/min via INTRAVENOUS
  Administered 2019-10-03: 150 ug/min via INTRAVENOUS
  Administered 2019-10-03: 170 ug/min via INTRAVENOUS
  Filled 2019-10-01 (×9): qty 250

## 2019-10-01 MED ORDER — ALTEPLASE 2 MG IJ SOLR: 2.0000 mg | Freq: Once | INTRAMUSCULAR | Status: DC | PRN

## 2019-10-01 MED ORDER — PENTAFLUOROPROP-TETRAFLUOROETH EX AERO: 1.0000 | INHALATION_SPRAY | CUTANEOUS | Status: DC | PRN

## 2019-10-01 MED ORDER — LIDOCAINE-PRILOCAINE 2.5-2.5 % EX CREA: 1.0000 "application " | TOPICAL_CREAM | CUTANEOUS | Status: DC | PRN

## 2019-10-01 MED ORDER — CHLORHEXIDINE GLUCONATE CLOTH 2 % EX PADS
6.0000 | MEDICATED_PAD | Freq: Every day | CUTANEOUS | Status: DC
  Administered 2019-10-01 – 2019-10-03 (×3): 6 via TOPICAL

## 2019-10-01 MED ORDER — SODIUM CHLORIDE 0.9 % IV SOLN
500.0000 mg | INTRAVENOUS | Status: AC
  Administered 2019-10-01 – 2019-10-05 (×5): 500 mg via INTRAVENOUS
  Filled 2019-10-01 (×6): qty 500

## 2019-10-01 MED ORDER — SODIUM CHLORIDE 0.9 % IV SOLN: 100.0000 mL | INTRAVENOUS | Status: DC | PRN

## 2019-10-01 MED ORDER — ONDANSETRON HCL 4 MG/2ML IJ SOLN
INTRAMUSCULAR | Status: AC
  Filled 2019-10-01: qty 2

## 2019-10-01 MED ORDER — ACETAMINOPHEN 325 MG PO TABS
650.0000 mg | ORAL_TABLET | Freq: Four times a day (QID) | ORAL | Status: DC | PRN
  Administered 2019-10-01 – 2019-10-03 (×5): 650 mg via ORAL
  Filled 2019-10-01 (×4): qty 2

## 2019-10-01 MED ORDER — PANTOPRAZOLE SODIUM 40 MG IV SOLR
40.0000 mg | Freq: Every day | INTRAVENOUS | Status: DC
  Administered 2019-10-01: 40 mg via INTRAVENOUS
  Filled 2019-10-01: qty 40

## 2019-10-01 MED ORDER — ALBUTEROL SULFATE (2.5 MG/3ML) 0.083% IN NEBU
5.0000 mg | INHALATION_SOLUTION | Freq: Once | RESPIRATORY_TRACT | Status: AC
  Administered 2019-10-01: 5 mg via RESPIRATORY_TRACT
  Filled 2019-10-01: qty 6

## 2019-10-01 MED ORDER — SODIUM CHLORIDE 0.9% IV SOLUTION: Freq: Once | INTRAVENOUS | Status: DC

## 2019-10-01 MED ORDER — NITROGLYCERIN 0.4 MG SL SUBL
0.4000 mg | SUBLINGUAL_TABLET | SUBLINGUAL | Status: DC | PRN
  Administered 2019-10-01: 0.4 mg via SUBLINGUAL
  Filled 2019-10-01: qty 1

## 2019-10-01 MED ORDER — FUROSEMIDE 10 MG/ML IJ SOLN
160.0000 mg | Freq: Once | INTRAVENOUS | Status: AC
  Administered 2019-10-01: 160 mg via INTRAVENOUS
  Filled 2019-10-01: qty 10

## 2019-10-01 MED ORDER — ORAL CARE MOUTH RINSE
15.0000 mL | Freq: Two times a day (BID) | OROMUCOSAL | Status: DC
  Administered 2019-10-02 – 2019-10-04 (×5): 15 mL via OROMUCOSAL

## 2019-10-01 MED ORDER — ONDANSETRON HCL 4 MG/2ML IJ SOLN
4.0000 mg | Freq: Once | INTRAMUSCULAR | Status: AC
  Administered 2019-10-01: 4 mg via INTRAVENOUS

## 2019-10-01 MED ORDER — MIDAZOLAM HCL 2 MG/2ML IJ SOLN
INTRAMUSCULAR | Status: AC
  Filled 2019-10-01: qty 2

## 2019-10-01 MED ORDER — FUROSEMIDE 10 MG/ML IJ SOLN: 80.0000 mg | Freq: Once | INTRAMUSCULAR | Status: DC

## 2019-10-01 MED ORDER — SODIUM CHLORIDE 0.9 % IV SOLN: INTRAVENOUS | Status: DC

## 2019-10-01 MED ORDER — SODIUM CHLORIDE 0.9 % IV SOLN
2.0000 g | INTRAVENOUS | Status: DC
  Administered 2019-10-01 – 2019-10-05 (×5): 2 g via INTRAVENOUS
  Filled 2019-10-01 (×5): qty 20

## 2019-10-01 NOTE — ED Notes (Signed)
Dr. Lorin Mercy at bedside-- calling wife to discuss dialysis. Pt states that he refuses dialysis- procedure for getting temp cath placed explained.

## 2019-10-01 NOTE — ED Notes (Signed)
Dr. Justin Mend notified -- will place emergent dialysis orders.

## 2019-10-01 NOTE — Consult Note (Addendum)
NAME:  Patrick Ibarra, MRN:  [id number], DOB:  Aug 17, 1981, LOS: 0 ADMISSION DATE:  10/01/2019, CONSULTATION DATE:  10/01/19 REFERRING MD:  Karmen Bongo, MD CHIEF COMPLAINT:  Dialysis access  Brief History   39 year old male with ESRD on HD who presents with shortness of breath after missing dialysis x 1 week. Found in acute hypoxemic respiratory failure, hyperkalemia and metabolic acidosis. PCCM consulted for dialysis access.  History of present illness   Patrick Ibarra is a 39 year old male with ESRD on HD who presents with shortness of breath after missing dialysis x 1 week and found in acute hypoxemic respiratory failure, hyperkalemia and metabolic acidosis. Of note, he recently underwent ligation of left arm AVF with excision of fistrula aneurysm on 09/26/19.  In the ED, he had O2 saturations to 79% which improved on NRB. Labs significant for K 6.0, CO2 14 and BUN/Cr 139/35. WBC 15.6 and and Hg 6.8. CXR with patchy bilateral airspace diseaseHe was given lasix and started on antibiotics. He was also started on nitro gtt for hypertensive emergency. PCCM and Nephrology consulted.  Past Medical History  ESRD, HTN, DM2  Significant Hospital Events   1/29 Admitted to [facility]  Consults:  PCCM  Procedures:  Pending Vas Cath  Significant Diagnostic Tests:  CXR 1/29 - Patchy bilateral airspace disease  Micro Data:  SARS-COVID 1/29 - negative  Antimicrobials:  Ceftriaxone 1/29> Azithro 1/29>  Interim history/subjective:  As above  Objective   Blood pressure (!) 163/100, pulse (!) 114, temperature (!) 96.5 F (35.8 C), temperature source Rectal, resp. rate (!) 32, SpO2 93 %.    Vent Mode: BIPAP;PCV FiO2 (%):  [80 %-100 %] 80 % Set Rate:  [10 bmp] 10 bmp PEEP:  [5 cmH20] 5 cmH20   Intake/Output Summary (Last 24 hours) at 10/01/2019 8250 Last data filed at 10/01/2019 0370 Gross per 24 hour  Intake 400 ml  Output --  Net 400 ml   There were no vitals filed for this  visit.  Physical Exam: General: Obese, well-appearing, no acute distress HENT: [place], AT, OP clear, MMM Eyes: EOMI, no scleral icterus Respiratory: Tachypneic, bibasilar rales, no wheezing Cardiovascular: Tachycardic, regular rate, -M/R/G, no JVD GI: BS+, soft, nontender Extremities: LUE AVF, 1+ lower extremity edema Neuro: AAO x4, CNII-XII grossly intact Skin: Intact, no rashes or bruising Psych: Normal mood, normal affect  Resolved Hospital Problem list     Assessment & Plan:   Acute hypoxemic respiratory failure secondary to volume overload --Emergent dialysis catheter placement --Continue antibiotics for CAP coverage --F/u cultures  Hyperkalemia, overload --Dialysis once catheter is placed  HTN emergency --On nitro gtt  Acute on chronic anemia --Transfuse 1 U PRBC during dialysis --Trend CBC  Best practice:  Diet: NPO Pain/Anxiety/Delirium protocol (if indicated): -- VAP protocol (if indicated): -- DVT prophylaxis: Per primary GI prophylaxis: Per primary Glucose control: Per primary Mobility: BR Code Status: Full. Confirmed at bedside. Family Communication: Updated patient at bedside Disposition: ICU  Labs   CBC: Recent Labs  Lab 09/26/19 0731 10/01/19 0228 10/01/19 0305 10/01/19 0354  WBC 7.2 15.6*  --   --   HGB 7.2* 6.5* 6.8* 6.8*  HCT 23.1* 20.1* 20.0* 20.0*  MCV 91.3 89.7  --   --   PLT 214 282  --   --     Basic Metabolic Panel: Recent Labs  Lab 09/26/19 0731 09/26/19 0731 09/27/19 0319 09/27/19 0931 10/01/19 0228 10/01/19 0305 10/01/19 0354  NA 137  --  136  --  138 134* 134*  K 5.8*   < > >7.5* 6.0* 5.7* 5.5* 6.0*  CL 96*  --  97*  --  94*  --   --   CO2 18*  --  18*  --  14*  --   --   GLUCOSE 118*  --  152* 101* 131*  --   --   BUN 121*  --  126*  --  139*  --   --   CREATININE 30.96*  --  31.58*  --  35.76*  --   --   CALCIUM 7.5*  --  7.4*  --  7.9*  --   --    < > = values in this interval not displayed.   GFR: Estimated  Creatinine Clearance: 3.7 mL/min (A) (by C-G formula based on SCr of 35.76 mg/dL (H)). Recent Labs  Lab 09/26/19 0731 10/01/19 0228 10/01/19 0440  WBC 7.2 15.6*  --   LATICACIDVEN  --   --  2.2*    Liver Function Tests: No results for input(s): AST, ALT, ALKPHOS, BILITOT, PROT, ALBUMIN in the last 168 hours. No results for input(s): LIPASE, AMYLASE in the last 168 hours. No results for input(s): AMMONIA in the last 168 hours.  ABG    Component Value Date/Time   PHART 7.317 (L) 10/01/2019 0354   PCO2ART 33.4 10/01/2019 0354   PO2ART 180.0 (H) 10/01/2019 0354   HCO3 17.2 (L) 10/01/2019 0354   TCO2 18 (L) 10/01/2019 0354   ACIDBASEDEF 8.0 (H) 10/01/2019 0354   O2SAT 100.0 10/01/2019 0354     Coagulation Profile: No results for input(s): INR, PROTIME in the last 168 hours.  Cardiac Enzymes: No results for input(s): CKTOTAL, CKMB, CKMBINDEX, TROPONINI in the last 168 hours.  HbA1C: Hemoglobin A1C  Date/Time Value Ref Range Status  06/10/2019 01:59 PM 4.7 4.0 - 5.6 % Final  09/08/2017 01:49 PM 4.9  Final   Hgb A1c MFr Bld  Date/Time Value Ref Range Status  03/01/2017 03:53 AM 5.8 (H) 4.8 - 5.6 % Final    Comment:    (NOTE)         Pre-diabetes: 5.7 - 6.4         Diabetes: >6.4         Glycemic control for adults with diabetes: <7.0   12/21/2013 05:10 AM 7.5 (H) <5.7 % Final    Comment:    (NOTE)                                                                       According to the ADA Clinical Practice Recommendations for 2011, when HbA1c is used as a screening test:  >=6.5%   Diagnostic of Diabetes Mellitus           (if abnormal result is confirmed) 5.7-6.4%   Increased risk of developing Diabetes Mellitus References:Diagnosis and Classification of Diabetes Mellitus,Diabetes XOVA,9191,66(MAYOK 1):S62-S69 and Standards of Medical Care in         Diabetes - 2011,Diabetes Care,2011,34 (Suppl 1):S11-S61.    CBG: Recent Labs  Lab 09/26/19 1204 09/26/19 1632  09/26/19 2103 09/27/19 0623 09/27/19 1137  GLUCAP 153* 169* 180* 139* 96    Review of Systems:  Reports shortness of breath and fatigue  Past Medical History  He,  has a past medical history of Chronic kidney disease, Diabetes mellitus without complication ([place]), Hypertension, LV dysfunction, and Obesity.   Surgical History    Past Surgical History:  Procedure Laterality Date  . AV FISTULA PLACEMENT Left 04/10/2017   Procedure: ARTERIOVENOUS (AV) FISTULA CREATION LEFT ARM;  Surgeon: Conrad [place], MD;  Location: [place];  Service: Vascular;  Laterality: Left;  . ESOPHAGOGASTRODUODENOSCOPY (EGD) WITH PROPOFOL N/A 04/04/2017   Procedure: ESOPHAGOGASTRODUODENOSCOPY (EGD) WITH PROPOFOL;  Surgeon: Carol Ada, MD;  Location: [place];  Service: Endoscopy;  Laterality: N/A;  . FISTULA SUPERFICIALIZATION Left 06/22/2019   Procedure: 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 Primary repair.;  Surgeon: Angelia Mould, MD;  Location: [facility] OR;  Service: Vascular;  Laterality: Left;  . INSERTION OF DIALYSIS CATHETER Right 04/10/2017   Procedure: INSERTION OF DIALYSIS CATHETER - RIGHT INTERNAL JUGULAR PLACEMENT;  Surgeon: Conrad Leslie, MD;  Location: [place];  Service: Vascular;  Laterality: Right;  . 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 TONSILLECTOMY AND ADENOIDECTOMY       Social History   reports that he has never smoked. He has never used smokeless tobacco. He reports previous alcohol use. He reports that he does not use drugs.   Family History   His family history includes Diabetes in his mother; Hypertension in his father and mother.   Allergies No Known Allergies   Home Medications  Prior to Admission medications   Medication Sig Start Date  End Date Taking? Authorizing Provider  Accu-Chek FastClix Lancets MISC Use as instructed to check blood sugar once daily. E11.9 06/17/19   Ladell Pier, MD  amLODipine (NORVASC) 10 MG tablet Take 1 tablet (10 mg total) by mouth daily. 06/10/19   Ladell Pier, MD  Blood Glucose Monitoring Suppl (ACCU-CHEK GUIDE ME) w/Device KIT 1 kit by Does not apply route daily. Use as instructed to check blood sugar once daily. E11.9 06/17/19   Ladell Pier, MD  carvedilol (COREG) 3.125 MG tablet TAKE 1 TABLET (3.125 MG TOTAL) BY MOUTH 2 (TWO) TIMES DAILY WITH A MEAL. Patient not taking: Reported on 09/26/2019 10/01/18   Ladell Pier, MD  carvedilol (COREG) 6.25 MG tablet Take 6.25 mg by mouth 2 (two) times daily with a meal.    [provider]  glucose blood (ACCU-CHEK GUIDE) test strip Use as instructed to check blood sugar once daily. E11.9 06/17/19   Ladell Pier, MD  lovastatin (MEVACOR) 20 MG tablet Take 1 tablet (20 mg total) by mouth at bedtime. 06/12/19   Ladell Pier, MD  oxyCODONE-acetaminophen (PERCOCET/ROXICET) 5-325 MG tablet Take 1 tablet by mouth every 6 (six) hours as needed for moderate pain. 09/27/19   Dagoberto Ligas, PA-C     Critical care time: 39 min    The patient is critically ill with multiple organ systems failure and requires high complexity decision making for assessment and support, frequent evaluation and titration of therapies, application of advanced monitoring technologies and extensive interpretation of multiple databases.   Rodman Pickle, M.D. [facility] Pulmonary/Critical Care Medicine 10/01/2019 8:29 AM   Please see Amion for pager number to reach on-call Pulmonary and Critical Care Team.

## 2019-10-01 NOTE — ED Provider Notes (Signed)
[facility] EMERGENCY DEPARTMENT Provider Note   CSN: [id number] Arrival date & time: 10/01/19  0205     History Chief Complaint  Patient presents with  . Respiratory Distress   Level 5 caveat due to acuity of condition Patrick Ibarra is a 39 y.o. male.  The history is provided by the patient.  Shortness of Breath Severity:  Severe Onset quality:  Sudden Duration:  10 minutes Timing:  Constant Progression:  Worsening Chronicity:  New Relieved by:  Nothing Worsened by:  Nothing    Patient with history of ESRD on dialysis, diabetes, hypertension presents with shortness of breath.  He reports sudden onset of shortness of breath over 10 hours ago.  It is worsening. Patient also reports vomiting Patient reports it has been over a week since dialysis Past Medical History:  Diagnosis Date  . Chronic kidney disease    dialysis M-W-F  . Diabetes mellitus without complication (HCC)    type 2, diet controlled  . Hypertension   . LV dysfunction   . Obesity     Patient Active Problem List   Diagnosis Date Noted  . Arteriovenous fistula for hemodialysis in place, primary ([place]) 09/26/2019  . Skin ulcer of upper arm ([place]) 06/02/2019  . Pre-transplant evaluation for kidney transplant 09/05/2017  . Diabetes mellitus type II, uncontrolled ([place]) 09/04/2017  . Mallory-Weiss tear 09/04/2017  . Reactive depression 09/04/2017  . Pancreatitis, acute 04/04/2017  . GI bleed 04/03/2017  . Anemia of chronic disease 04/03/2017  . Obesity 03/01/2017  . Anemia of chronic kidney failure 03/01/2017  . Essential hypertension 01/05/2014  . Diabetes mellitus ([place]) 12/24/2011  . ESRD (end stage renal disease) ([place]) 12/24/2011    Past Surgical History:  Procedure Laterality Date  . AV FISTULA PLACEMENT Left 04/10/2017   Procedure: ARTERIOVENOUS (AV) FISTULA CREATION LEFT ARM;  Surgeon: Conrad [place], MD;  Location: [facility];  Service: Vascular;  Laterality: Left;  .  ESOPHAGOGASTRODUODENOSCOPY (EGD) WITH PROPOFOL N/A 04/04/2017   Procedure: ESOPHAGOGASTRODUODENOSCOPY (EGD) WITH PROPOFOL;  Surgeon: Carol Ada, MD;  Location: [facility];  Service: Endoscopy;  Laterality: N/A;  . 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 INSERTION OF DIALYSIS CATHETER Right 04/10/2017   Procedure: INSERTION OF DIALYSIS CATHETER - RIGHT INTERNAL JUGULAR PLACEMENT;  Surgeon: Conrad [place], MD;  Location: [facility];  Service: Vascular;  Laterality: Right;  . 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 TONSILLECTOMY AND ADENOIDECTOMY         Family History  Problem Relation Age of Onset  . Hypertension Mother   . Diabetes Mother   . Hypertension Father     Social History   Tobacco Use  . Smoking status: Never Smoker  . Smokeless tobacco: Never Used  Substance Use Topics  . Alcohol use: Not Currently  . Drug use: No    Home Medications Prior to Admission medications   Medication Sig Start Date End Date Taking? Authorizing Provider  Accu-Chek FastClix Lancets MISC Use as instructed to check blood sugar once daily. E11.9 06/17/19   Ladell Pier, MD  amLODipine (NORVASC) 10 MG  tablet Take 1 tablet (10 mg total) by mouth daily. 06/10/19   Ladell Pier, MD  Blood Glucose Monitoring Suppl (ACCU-CHEK GUIDE ME) w/Device KIT 1 kit by Does not apply route daily. Use as instructed to check blood sugar once daily. E11.9 06/17/19   Ladell Pier, MD  carvedilol (COREG) 3.125 MG tablet TAKE 1 TABLET (3.125 MG TOTAL) BY MOUTH 2 (TWO) TIMES DAILY WITH A MEAL. Patient not  taking: Reported on 09/26/2019 10/01/18   Ladell Pier, MD  carvedilol (COREG) 6.25 MG tablet Take 6.25 mg by mouth 2 (two) times daily with a meal.    [provider]  glucose blood (ACCU-CHEK GUIDE) test strip Use as instructed to check blood sugar once daily. E11.9 06/17/19   Ladell Pier, MD  lovastatin (MEVACOR) 20 MG tablet Take 1 tablet (20 mg total) by mouth at bedtime. 06/12/19   Ladell Pier, MD  oxyCODONE-acetaminophen (PERCOCET/ROXICET) 5-325 MG tablet Take 1 tablet by mouth every 6 (six) hours as needed for moderate pain. 09/27/19   Dagoberto Ligas, PA-C    Allergies    Patient has no known allergies.  Review of Systems   Review of Systems  Unable to perform ROS: Acuity of condition  Respiratory: Positive for shortness of breath.     Physical Exam Updated Vital Signs BP (!) 173/114   Pulse (!) 124   Temp 97.8 F (36.6 C) (Oral)   Resp 11   SpO2 97%   Physical Exam CONSTITUTIONAL: Well developed, ill-appearing HEAD: Normocephalic/atraumatic EYES: EOMI/PERRL ENMT: Mucous membranes moist NECK: supple no meningeal signs SPINE/BACK:entire spine nontender CV: S1/S2 noted, tachycardic LUNGS: Crackles bilaterally, tachypnea ABDOMEN: soft, nontender, no rebound or guarding, bowel sounds noted throughout abdomen GU:no cva tenderness NEURO: Pt is awake/alert/appropriate, moves all extremitiesx4.  No facial droop.   EXTREMITIES: pulses normal/equal, full ROM, bandage noted over left upper extremity SKIN: warm, diaphoretic PSYCH: Mildly anxious  ED Results / Procedures / Treatments   Labs (all labs ordered are listed, but only abnormal results are displayed) Labs Reviewed  CBC - Abnormal; Notable for the following components:      Result Value   WBC 15.6 (*)    RBC 2.24 (*)    Hemoglobin 6.5 (*)    HCT 20.1 (*)    RDW 15.9 (*)    All other components within normal limits  BASIC METABOLIC PANEL - Abnormal; Notable for the following  components:   Potassium 5.7 (*)    Chloride 94 (*)    CO2 14 (*)    Glucose, Bld 131 (*)    BUN 139 (*)    Creatinine, Ser 35.76 (*)    Calcium 7.9 (*)    GFR calc non Af Amer 1 (*)    GFR calc Af Amer 1 (*)    Anion gap 30 (*)    All other components within normal limits  LACTIC ACID, PLASMA - Abnormal; Notable for the following components:   Lactic Acid, Venous 2.2 (*)    All other components within normal limits  POCT I-STAT EG7 - Abnormal; Notable for the following components:   pCO2, Ven 31.6 (*)    Bicarbonate 15.2 (*)    TCO2 16 (*)    Acid-base deficit 10.0 (*)    Sodium 134 (*)    Potassium 5.5 (*)    Calcium, Ion 0.86 (*)    HCT 20.0 (*)    Hemoglobin 6.8 (*)    All other components within normal limits  POCT I-STAT 7, (LYTES, BLD GAS, ICA,H+H) - Abnormal; Notable for the following components:   pH, Arterial 7.317 (*)    pO2, Arterial 180.0 (*)    Bicarbonate 17.2 (*)    TCO2 18 (*)    Acid-base deficit 8.0 (*)    Sodium 134 (*)    Potassium 6.0 (*)    Calcium, Ion 0.93 (*)    HCT 20.0 (*)    Hemoglobin 6.8 (*)    All other components within normal limits  RESPIRATORY PANEL BY RT PCR (FLU A&B, COVID)  CULTURE, BLOOD (ROUTINE X 2)  CULTURE, BLOOD (ROUTINE X 2)  LACTIC ACID, PLASMA  POC OCCULT BLOOD, ED    EKG EKG Interpretation  Date/Time:  Friday October 01 2019 02:14:40 EST Ventricular Rate:  121 PR Interval:    QRS Duration: 84 QT Interval:  292 QTC Calculation: 415 R Axis:   44 Text Interpretation: Sinus tachycardia Ventricular premature complex Nonspecific repol abnormality, diffuse leads Confirmed by Ripley Fraise [phone]) on 10/01/2019 2:28:17 AM   Radiology DG Chest Port 1 View  Result Date: 10/01/2019 CLINICAL DATA:  Shortness of breath EXAM: PORTABLE CHEST 1 VIEW COMPARISON:  12/31/2017 FINDINGS: Patchy airspace disease throughout both lungs. Heart is normal size. No effusions. No acute bony abnormality. IMPRESSION: Patchy bilateral  airspace disease concerning for pneumonia. Electronically Signed   By: Rolm Baptise M.D.   On: 10/01/2019 02:40    Procedures .Critical Care Performed by: Ripley Fraise, MD Authorized by: Ripley Fraise, MD   Critical care provider statement:    Critical care time (minutes):  60   Critical care start time:  10/01/2019 3:00 AM   Critical care end time:  10/01/2019 4:00 AM   Critical care time was exclusive of:  Separately billable procedures and treating other patients   Critical care was necessary to treat or prevent imminent or life-threatening deterioration of the following conditions:  Renal failure and respiratory failure   Critical care was time spent personally by me on the following activities:  Ordering and review of radiographic studies, ordering and review of laboratory studies, pulse oximetry, re-evaluation of patient's condition, review of old charts, examination of patient, evaluation of patient's response to treatment, development of treatment plan with patient or surrogate, obtaining history from patient or surrogate, discussions with consultants and ordering and performing treatments and interventions   I assumed direction of critical care for this patient from another provider in my specialty: no       Medications Ordered in ED Medications  nitroGLYCERIN (NITROSTAT) SL tablet 0.4 mg (0.4 mg Sublingual Given 10/01/19 0425)  cefTRIAXone (ROCEPHIN) 2 g in sodium chloride 0.9 % 100 mL IVPB (0 g Intravenous Stopped 10/01/19 0547)  azithromycin (ZITHROMAX) 500 mg in sodium chloride 0.9 % 250 mL IVPB (500 mg Intravenous New Bag/Given 10/01/19 0437)  furosemide (LASIX) 160 mg in dextrose 5 % 50 mL IVPB (has no administration in time range)  nitroGLYCERIN 50 mg in dextrose 5 % 250 mL (0.2 mg/mL) infusion (has no administration in time range)  ondansetron (ZOFRAN) injection 4 mg (4 mg Intravenous Given 10/01/19 0304)  ondansetron (ZOFRAN) injection 4 mg (4 mg Intravenous Given  10/01/19 0426)  albuterol (PROVENTIL) (2.5 MG/3ML) 0.083% nebulizer solution 5 mg (5 mg Nebulization Given 10/01/19 0527)    ED Course  I have reviewed the triage vital signs and the nursing notes.  Pertinent labs & imaging results that were available during my care of the patient were reviewed by me and considered  in my medical decision making (see chart for details).    MDM Rules/Calculators/A&P                      2:55 AM Patient arrives in respiratory distress.  He is on nonrebreather oxygen.  EMS reports that he was 74% on room air. He is awake alert and is able to speak.  He does cough frequently during exam He reports it has been at least a week since his last dialysis session Patient was discharged from the hospital on January 25 after undergoing operative repair of left arm AV fistula.  Patient refused any further dialysis access.  He was counseled at that time that he would potentially go into respiratory failure or die from hyperkalemic arrest and patient was comfortable with these risks Labs are pending at this time Pt is critically ill at this time 3:48 AM Patient reports he continues to refuse dialysis Potassium 5.5 on initial blood draw Patient does have acute on chronic anemia. COVID-19 testing pending 5:27 AM Covid test is negative. Patient with acute on chronic anemia, but no signs of GI bleed, Hemoccult negative X-rays likely combination of pneumonia/edema Patient continues to refuse dialysis.  I had a long conversation with the patient and he understands the risk of death by refusing dialysis Also had a conversation with patient about mechanical ventilation/intubation.  At this time he would like to think more about this issue.  I did order noninvasive ventilation for the patient.  He has been given nitroglycerin.  He has been treated for pneumonia also given albuterol. Discussed the case with his wife via phone at [phone] She also reports that he has been  refusing dialysis. I did inform her that he may  need to be intubated and he is thinking about this. I called for admission to the hospitalist, they request nephrology consultation first 6:15 AM Discussed with Dr. Moshe Cipro with nephrology. Since patient is refusing dialysis, she recommends Lasix 160 mg IV, and can continue nitroglycerin. Patient is not tolerating noninvasive ventilation at this time.  Discussed with Dr. Marlowe Sax with the hospitalist for admission  I discussed with the patient again about dialysis.  He continues to refuse hemodialysis I discussed with the patient if he would like to be intubated or have CPR.  Patient now reports he would agree to mechanical ventilation and CPR if he went to cardiac arrest. I did advise him that if he were to be intubated but continued to refuse dialysis, it would likely be difficult to be extubated Pt understands this   Patrick Ibarra was evaluated in Emergency Department on 10/01/2019 for the symptoms described in the history of present illness. He was evaluated in the context of the global COVID-19 pandemic, which necessitated consideration that the patient might be at risk for infection with the SARS-CoV-2 virus that causes COVID-19. Institutional protocols and algorithms that pertain to the evaluation of patients at risk for COVID-19 are in a state of rapid change based on information released by regulatory bodies including the CDC and federal and state organizations. These policies and algorithms were followed during the patient's care in the ED.   Final Clinical Impression(s) / ED Diagnoses Final diagnoses:  ESRD (end stage renal disease) ([place])  Acute respiratory failure with hypoxia ([place])  Acute anemia  Multifocal pneumonia  Hypertensive emergency  Acute pulmonary edema ([place])    Rx / DC Orders ED Discharge Orders    None  Ripley Fraise, MD 10/01/19 [phone]

## 2019-10-01 NOTE — Progress Notes (Signed)
Pt placed on bi-pap. RT will continue to monitor.

## 2019-10-01 NOTE — Procedures (Signed)
Central Venous Catheter Insertion Procedure Note MARKAIL MICHELS [id number] [birth year] Procedure: Insertion of Central Venous Catheter Indications: Hemodialysis    Procedure Details Consent: Risks of procedure as well as the alternatives and risks of each were explained to the (patient/caregiver).  Consent for procedure obtained. Time Out: Verified patient identification, verified procedure, site/side was marked, verified correct patient position, special equipment/implants available, medications/allergies/relevent history reviewed, required imaging and test results available.  Performed  Maximum sterile technique was used including antiseptics, cap, gloves, gown, hand hygiene, mask and sheet. Skin prep: Chlorhexidine; local anesthetic administered A antimicrobial bonded/coated triple lumen catheter was placed in the right internal jugular vein using the Seldinger technique.  Evaluation Blood flow good Complications: No apparent complications Patient did tolerate procedure well. Chest X-ray ordered to verify placement.  CXR: pending.  Lorene Dy 10/01/2019, 9:34 AM

## 2019-10-01 NOTE — ED Notes (Signed)
Report given to nurse on 3E- pt will go to dialysis before going to [facility]

## 2019-10-01 NOTE — Progress Notes (Signed)
BIPAP at bedside on STBY patient on NCAN 5L and resting comfortably. RT to cont to monitor.

## 2019-10-01 NOTE — Consult Note (Signed)
[facility] Renal Consultation Note    Indication for Consultation:  Management of ESRD/hemodialysis; anemia, hypertension/volume and secondary hyperparathyroidism PCP:  HPI: Patrick Ibarra is a 39 y.o. male with ESRD secondary to HTN on MWF HD at [facility] on HD almost 2.5 years. Patient was admitted to the hospital 1/24 with necrotic ulceration of AVF and underwent excision of fistula aneurysm and ligation of AVF at that time.  POD 1 K was greater than 7.5  The patient refused catheter placement fo emergenty HD and was very much aware that he would diet if he didn't received treatment. Surgery and nephrology discussed this with him.  He expressed his choice along with his wife to forgo future HD.  He also declined palliative care and home hospice. His hyperkalemia was treated chemically prior to his d/c.   The patient returned to the ED this am c/o SOB and vomiting.  He had not gone to dialysis as he did not have an access. He was evaluated by EDP Sats were74% upon arrival.  COVID neg,  Na 138 K 5.7 CO2 14 BUN 139 Cr 35 glu 131 Ca 7.9 WBC 15.6 hgb 6.5 plts 282 CXR suggested PNA/pulmonary edema He was empirically given IV lasx and NTG drip.  He initially refused HD but at the same time wished to be a full code.  His breathing worsened.  EDP had a frank discussion with him and his wife and told him he would die soon without HD. Apparently per EDP note, he and his wife had been researching a naturopathic treatment for ESRD instead of HD and that the urgency of the situation did not fully allow them to implement this plan.  He is placed on BiPAP and currently dialyzing.  Last outpatient HD of record was 1/13.  Past Medical History:  Diagnosis Date  . Chronic kidney disease    dialysis M-W-F  . Diabetes mellitus without complication (HCC)    type 2, diet controlled  . Hypertension   . LV dysfunction   . Obesity    Past Surgical History:  Procedure Laterality Date  . AV FISTULA PLACEMENT  Left 04/10/2017   Procedure: ARTERIOVENOUS (AV) FISTULA CREATION LEFT ARM;  Surgeon: Conrad Otter Tail, MD;  Location: [place];  Service: Vascular;  Laterality: Left;  . ESOPHAGOGASTRODUODENOSCOPY (EGD) WITH PROPOFOL N/A 04/04/2017   Procedure: ESOPHAGOGASTRODUODENOSCOPY (EGD) WITH PROPOFOL;  Surgeon: Carol Ada, MD;  Location: [place];  Service: Endoscopy;  Laterality: N/A;  . FISTULA SUPERFICIALIZATION Left 06/22/2019   Procedure: 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 Primary repair.;  Surgeon: Angelia Mould, MD;  Location: [facility] OR;  Service: Vascular;  Laterality: Left;  . INSERTION OF DIALYSIS CATHETER Right 04/10/2017   Procedure: INSERTION OF DIALYSIS CATHETER - RIGHT INTERNAL JUGULAR PLACEMENT;  Surgeon: Conrad [place], MD;  Location: [place];  Service: Vascular;  Laterality: Right;  . THROMBECTOMY AND REVISION OF ARTERIOVENTOUS (AV) GORETEX  GRAFT Left 09/26/2019   Procedure: Ligation left arm AV fistula with excision of left arm AV fistula aneurysm;  Surgeon: Elam Dutch, MD;  Location: [facility] OR;  Service: Vascular;  Laterality: Left;  . TONSILLECTOMY AND ADENOIDECTOMY     Family History  Problem Relation Age of Onset  . Hypertension Mother   . Diabetes Mother   . Hypertension Father    Social History:  reports that he has never smoked. He has never used smokeless tobacco. He reports previous alcohol use. He reports that he does not  use drugs. No Known Allergies Prior to Admission medications   Medication Sig Start Date End Date Taking? Authorizing Provider  Accu-Chek FastClix Lancets MISC Use as instructed to check blood sugar once daily. E11.9 06/17/19   Ladell Pier, MD  amLODipine (NORVASC) 10 MG tablet Take 1 tablet (10 mg total) by mouth daily. 06/10/19   Ladell Pier, MD  Blood Glucose Monitoring Suppl (ACCU-CHEK GUIDE ME) w/Device KIT 1 kit by Does not apply route daily. Use as instructed to check blood sugar once daily. E11.9 06/17/19    Ladell Pier, MD  carvedilol (COREG) 3.125 MG tablet TAKE 1 TABLET (3.125 MG TOTAL) BY MOUTH 2 (TWO) TIMES DAILY WITH A MEAL. Patient not taking: Reported on 09/26/2019 10/01/18   Ladell Pier, MD  carvedilol (COREG) 6.25 MG tablet Take 6.25 mg by mouth 2 (two) times daily with a meal.    [provider]  glucose blood (ACCU-CHEK GUIDE) test strip Use as instructed to check blood sugar once daily. E11.9 06/17/19   Ladell Pier, MD  lovastatin (MEVACOR) 20 MG tablet Take 1 tablet (20 mg total) by mouth at bedtime. 06/12/19   Ladell Pier, MD  oxyCODONE-acetaminophen (PERCOCET/ROXICET) 5-325 MG tablet Take 1 tablet by mouth every 6 (six) hours as needed for moderate pain. 09/27/19   Dagoberto Ligas, PA-C   Current Facility-Administered Medications  Medication Dose Route Frequency Provider Last Rate Last Admin  . 0.9 %  sodium chloride infusion (Manually program via Guardrails IV Fluids)   Intravenous Once Minor, Grace Bushy, NP      . 0.9 %  sodium chloride infusion   Intravenous Continuous Minor, Grace Bushy, NP      . azithromycin (ZITHROMAX) 500 mg in sodium chloride 0.9 % 250 mL IVPB  500 mg Intravenous Q24H Minor, Grace Bushy, NP   Stopped at 10/01/19 0537  . cefTRIAXone (ROCEPHIN) 2 g in sodium chloride 0.9 % 100 mL IVPB  2 g Intravenous Q24H Minor, Grace Bushy, NP   Stopped at 10/01/19 0547  . chlorhexidine (PERIDEX) 0.12 % solution 15 mL  15 mL Mouth Rinse BID Margaretha Seeds, MD   Stopped at 10/01/19 1307  . Chlorhexidine Gluconate Cloth 2 % PADS 6 each  6 each Topical Q0600 Minor, Grace Bushy, NP   6 each at 10/01/19 1045  . MEDLINE mouth rinse  15 mL Mouth Rinse q12n4p Margaretha Seeds, MD   Stopped at 10/01/19 1307  . nitroGLYCERIN (NITROSTAT) SL tablet 0.4 mg  0.4 mg Sublingual Q5 min PRN Minor, Grace Bushy, NP   0.4 mg at 10/01/19 0425  . nitroGLYCERIN 50 mg in dextrose 5 % 250 mL (0.2 mg/mL) infusion  0-200 mcg/min Intravenous Continuous Minor, Grace Bushy, NP    Stopped at 10/01/19 1330  . pantoprazole (PROTONIX) injection 40 mg  40 mg Intravenous QHS Minor, Grace Bushy, NP       Labs: Basic Metabolic Panel: Recent Labs  Lab 09/27/19 0319 09/27/19 0319 09/27/19 0931 10/01/19 0228 10/01/19 0305 10/01/19 0354 10/01/19 1031 10/01/19 1129  NA 136   < >  --  138   < > 134* 133* 138  K >7.5*   < > 6.0* 5.7*   < > 6.0* 7.2* 6.7*  CL 97*  --   --  94*  --   --   --  94*  CO2 18*  --   --  14*  --   --   --  19*  GLUCOSE  152*   < > 101* 131*  --   --   --  134*  BUN 126*  --   --  139*  --   --   --  142*  CREATININE 31.58*  --   --  35.76*  --   --   --  34.27*  CALCIUM 7.4*  --   --  7.9*  --   --   --  7.7*  PHOS  --   --   --   --   --   --   --  >30.0*   < > = values in this interval not displayed.   Liver Function Tests: Recent Labs  Lab 10/01/19 1129  AST 35  ALT 8  ALKPHOS 39  BILITOT 0.3  PROT 7.0  ALBUMIN 2.9*   No results for input(s): LIPASE, AMYLASE in the last 168 hours. No results for input(s): AMMONIA in the last 168 hours. CBC: Recent Labs  Lab 09/26/19 0731 09/26/19 0731 10/01/19 0228 10/01/19 0305 10/01/19 0354 10/01/19 1031 10/01/19 1129  WBC 7.2  --  15.6*  --   --   --  22.1*  HGB 7.2*   < > 6.5*   < > 6.8* 12.2* 6.1*  HCT 23.1*   < > 20.1*   < > 20.0* 36.0* 19.0*  MCV 91.3  --  89.7  --   --   --  90.5  PLT 214  --  282  --   --   --  230   < > = values in this interval not displayed.   Cardiac Enzymes: No results for input(s): CKTOTAL, CKMB, CKMBINDEX, TROPONINI in the last 168 hours. CBG: Recent Labs  Lab 09/26/19 1632 09/26/19 2103 09/27/19 0623 09/27/19 1137 10/01/19 1057  GLUCAP 169* 180* 139* 96 125*   Iron Studies: No results for input(s): IRON, TIBC, TRANSFERRIN, FERRITIN in the last 72 hours. Studies/Results: DG Chest Portable 1 View  Result Date: 10/01/2019 CLINICAL DATA:  Respiratory distress.  Central line. EXAM: PORTABLE CHEST 1 VIEW COMPARISON:  10/01/2019. FINDINGS: Right IJ  line no with tip over SVC. Stable cardiomegaly. Diffuse severe bilateral pulmonary infiltrates are again noted. Small left pleural effusion cannot be excluded. No pneumothorax. IMPRESSION: 1.  Right IJ line noted with tip over SVC. 2.  Stable cardiomegaly. 3. Diffuse severe bilateral pulmonary infiltrates again noted. No interim change. Small left pleural effusion cannot be excluded. Electronically Signed   By: Marcello Moores  Register   On: 10/01/2019 09:58   DG Chest Port 1 View  Result Date: 10/01/2019 CLINICAL DATA:  Shortness of breath EXAM: PORTABLE CHEST 1 VIEW COMPARISON:  12/31/2017 FINDINGS: Patchy airspace disease throughout both lungs. Heart is normal size. No effusions. No acute bony abnormality. IMPRESSION: Patchy bilateral airspace disease concerning for pneumonia. Electronically Signed   By: Rolm Baptise M.D.   On: 10/01/2019 02:40    ROS: Too acutely ill to answer questions  Physical Exam: Vitals:   10/01/19 1430 10/01/19 1445 10/01/19 1500 10/01/19 1515  BP: (!) 123/96 106/87  115/86  Pulse: (!) 236 (!) 236 (!) 226 (!) 226  Resp: (!) 23 (!) 27 19 (!) 21  Temp:  97.9 F (36.6 C)    TempSrc:  Axillary    SpO2: 100% 100% 100% 100%     General: large well noursished young male on BiPAP SOB with talking Head: NCAT  Neck: Supple.  Lungs:  Dim [email or web address] tachypnea  (above HR is not correct -  his tele reads 110s) Heart: tachy reg Abdomen: soft NT + BS M-S:  muscular Lower  1 + LE edema Neuro: arouseable Psych:  Responds to questions to limited extent at present difficult to hear Dialysis Access: right IJ temp cath - old ligated left AVF no bruit -doesn't look like dressing has been changed since surgery - nursing to clean up - sutures in place  Dialysis Orders:  MWF [facility] 4.5 hr 200 Optiflux 2 K 2 Ca EDW 105 425/800 -  Noncompliant with HD missed 6 tmt in last 30 days and 11 tmt in the last 60 days- last HD tmt 1/13  hgb 10.3 1/13 Mircera 100 q 2 weeks- last dose 1/10   venofer 50/week  -calcitriol 4.5previously   parsabiv 7.5  Assessment/Plan: 1. acute hypoxic respiratory failure - secondary to Pulmonary edema/ prob PNA, culture done - emperic antibiotics started 2. ESRD with severe uremia, metabolic acidosis and hyperkalemia - serial HD to lower Cr avoid disequilibrium syndrome; plan HD again tomorrow-  3. Hypertension/volume  - Excess volume contributing to hypertensive emergency - serial HD to lower volume goal 3.5 today - HD; meds per primary  4. Anemia  - hgb 6.5 - transfusing 1 unit PRBC - resume ESA 5. Metabolic bone disease -  parsabiv not available here - start binders when eating 6. Nutrition - NPO for now  7. Hx of noncompliance with dialysis - missing and shortening treatments prior to recent events 8. Goals of care - when off bipap and stable need frank dicussion about goals of care and need for permanent access and interim B[place], PA-C [place] [phone] 10/01/2019, 3:32 PM

## 2019-10-01 NOTE — ED Notes (Signed)
Consent signed for trialysis cath placement by Dr. Loanne Drilling

## 2019-10-01 NOTE — Progress Notes (Addendum)
Pt unable to tolerate BIPAP. Only wore for about 30 minutes, states he felt like he was suffocating. Pt back on NRB. RT Will continue to monitor.

## 2019-10-01 NOTE — Progress Notes (Signed)
Renal Navigator aware of patient's return to the hospital. Patient's OP HD clinic/[facility] had been previously notified upon his discharge on 09/27/19 of his refusal for ongoing HD, and therefore, Navigator is unsure if patient still has a seat in the OP HD clinic. Renal Navigator will follow along for patient's plan/discussion with MD as he may need to be re-referred for OP HD treatment.   Alphonzo Cruise, [place] Renal Navigator [phone]

## 2019-10-01 NOTE — ED Notes (Signed)
[facility]- [phone] Call with any concerns or updates

## 2019-10-01 NOTE — Consult Note (Signed)
ER Consult Note   Patrick Ibarra WPV:[id number] DOB: [birth year] DOA: 10/01/2019  PCP: Ladell Pier, MD Consultants:  Vascular; nephrology Patient coming from:  Home - lives with wife; NOK:  Wife, Birt Reinoso, [phone]  Chief Complaint: SOB  HPI: Patrick Ibarra is a 39 y.o. male with medical history significant of obesity (BMI 33); ESRD on MWF HD; DM; and HTN presenting with SOB after missing HD all week.  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  He had a similar episode 3 months ago, and at that time was recommended to have new access placed in the right arm but he declined.  The AV fistula was ligated on 1/24 and he then refused HD catheter placement.  He was specifically counseled about the high risk of death associated with this decision and he was offered and declined palliative care and hospice.  He was discharged on 1/25.  He has since developed n/v with severe SOB.  Upon arrival in the ER he was 74% on RA and was placed on a NRB.  His last HD was a week ago.  At 348 he was continuing to refuse HD.  At 527, his COVID test was negative and he was thought to have a combination of PNA and edema.  Patient continued to refuse HD.  He was offered BIPAP and NTG and treated with PNA. Dr. Christy Gentles spoke with his wife at that time.  At 615, Dr. Moshe Cipro was consulted.  Since the patient was refusing HD, she recommended 160 mg IV Lasix and NTG drip ongoing. He did not tolerate BIPAP.  He once again refused HD but declared himself to be a full code.  At the time of my evaluation, the patient was continuing to refuse HD.  However, he was visibly drowning in front of me.  He was coughing, choking, and sputtering with O2 sats 89-90% on NRB.  I had a very frank conversation with the patient and explained that he is actively dying without HD and that he would very likely drown from his secretions.  CPR would be ineffective at curing this  issue.  I explained about his kidneys inability to filter toxins leading to hyperkalemia but also his inability to clear fluid leading to critical volume overload.  He continued to refuse.  I told him that his only 2 options were to agree to HD or to proceed with hospice since he is very likely to die soon without HD.  I eventually called his wife and we had a conversation with her over speaker; they eventually agreed to a [facility] for emergent HD and then will proceed from there.  Apparently, they have done a significant amount of research and are planning to implement a naturopathic treatment for his ESRD rather than proceeding with long-term HD.  Unfortunately, the urgency of the situation did not allow them to fully implement this plan yet.    ED Course:  Carryover, per Dr. Marlowe Sax:  39 year old male with history of end-stage renal disease on dialysis presenting with complaints of shortness of breath after missing dialysis for the past 1 week. Patient was recently discharged from the hospital on 1/25 after operative repair of left arm AV fistula. He refused further dialysis access. On arrival to the ED oxygen saturation 74% on room air, placed on nonrebreather. Tachycardic with heart rate in the 120s and hypertensive. Labs showing potassium 5.7, bicarb 14. Hemoglobin 6.5, FOBT negative. BUN 139, creatinine 35.7.  Covid test negative. Chest x-ray showing bilateral airspace disease concerning for pneumonia versus pulmonary edema. Patient again continues to refuse dialysis. ED provider discussed the case with nephrology who advised giving Lasix which was ordered by ED provider. Ceftriaxone and azithromycin given for pneumonia coverage. Progressive care bed requested.  Review of Systems: Unable to perform given significant respiratory distress   Ambulatory Status:  Ambulates without assistance  Past Medical History:  Diagnosis Date  . Chronic kidney disease    dialysis M-W-F  . Diabetes  mellitus without complication (HCC)    type 2, diet controlled  . Hypertension   . LV dysfunction   . Obesity     Past Surgical History:  Procedure Laterality Date  . AV FISTULA PLACEMENT Left 04/10/2017   Procedure: ARTERIOVENOUS (AV) FISTULA CREATION LEFT ARM;  Surgeon: Conrad Lawtell, MD;  Location: [place];  Service: Vascular;  Laterality: Left;  . ESOPHAGOGASTRODUODENOSCOPY (EGD) WITH PROPOFOL N/A 04/04/2017   Procedure: ESOPHAGOGASTRODUODENOSCOPY (EGD) WITH PROPOFOL;  Surgeon: Carol Ada, MD;  Location: [place];  Service: Endoscopy;  Laterality: N/A;  . FISTULA SUPERFICIALIZATION Left 06/22/2019   Procedure: 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 Primary repair.;  Surgeon: Angelia Mould, MD;  Location[facility] OR;  Service: Vascular;  Laterality: Left;  . INSERTION OF DIALYSIS CATHETER Right 04/10/2017   Procedure: INSERTION OF DIALYSIS CATHETER - RIGHT INTERNAL JUGULAR PLACEMENT;  Surgeon: Conrad Peters, MD;  Location: [place];  Service: Vascular;  Laterality: Right;  . THROMBECTOMY AND REVISION OF ARTERIOVENTOUS (AV) GORETEX  GRAFT Left 09/26/2019   Procedure: Ligation left arm AV fistula with excision of left arm AV fistula aneurysm;  Surgeon: Elam Dutch, MD;  Location: [facility] OR;  Service: Vascular;  Laterality: Left;  . TONSILLECTOMY AND ADENOIDECTOMY      Social History   Socioeconomic History  . Marital status: Married    Spouse name: Not on file  . Number of children: Not on file  . Years of education: Not on file  . Highest education level: Not on file  Occupational History  . Not on file  Tobacco Use  . Smoking status: Never Smoker  . Smokeless tobacco: Never Used  Substance and Sexual Activity  . Alcohol use: Not Currently  . Drug use: No  . Sexual activity: Yes  Other Topics Concern  . Not on file  Social History Narrative   He is married with step kids. He works at [employer].    Social Determinants of Health   Financial  Resource Strain:   . Difficulty of Paying Living Expenses: Not on file  Food Insecurity:   . Worried About Charity fundraiser in the Last Year: Not on file  . Ran Out of Food in the Last Year: Not on file  Transportation Needs:   . Lack of Transportation (Medical): Not on file  . Lack of Transportation (Non-Medical): Not on file  Physical Activity:   . Days of Exercise per Week: Not on file  . Minutes of Exercise per Session: Not on file  Stress:   . Feeling of Stress : Not on file  Social Connections:   . Frequency of Communication with Friends and Family: Not on file  . Frequency of Social Gatherings with Friends and Family: Not on file  . Attends Religious Services: Not on file  . Active Member of Clubs or Organizations: Not on file  . Attends Archivist Meetings: Not on file  . Marital  Status: Not on file  Intimate Partner Violence:   . Fear of Current or Ex-Partner: Not on file  . Emotionally Abused: Not on file  . Physically Abused: Not on file  . Sexually Abused: Not on file    No Known Allergies  Family History  Problem Relation Age of Onset  . Hypertension Mother   . Diabetes Mother   . Hypertension Father     Prior to Admission medications   Medication Sig Start Date End Date Taking? Authorizing Provider  Accu-Chek FastClix Lancets MISC Use as instructed to check blood sugar once daily. E11.9 06/17/19   Ladell Pier, MD  amLODipine (NORVASC) 10 MG tablet Take 1 tablet (10 mg total) by mouth daily. 06/10/19   Ladell Pier, MD  Blood Glucose Monitoring Suppl (ACCU-CHEK GUIDE ME) w/Device KIT 1 kit by Does not apply route daily. Use as instructed to check blood sugar once daily. E11.9 06/17/19   Ladell Pier, MD  carvedilol (COREG) 3.125 MG tablet TAKE 1 TABLET (3.125 MG TOTAL) BY MOUTH 2 (TWO) TIMES DAILY WITH A MEAL. Patient not taking: Reported on 09/26/2019 10/01/18   Ladell Pier, MD  carvedilol (COREG) 6.25 MG tablet Take 6.25 mg  by mouth 2 (two) times daily with a meal.    [provider]  glucose blood (ACCU-CHEK GUIDE) test strip Use as instructed to check blood sugar once daily. E11.9 06/17/19   Ladell Pier, MD  lovastatin (MEVACOR) 20 MG tablet Take 1 tablet (20 mg total) by mouth at bedtime. 06/12/19   Ladell Pier, MD  oxyCODONE-acetaminophen (PERCOCET/ROXICET) 5-325 MG tablet Take 1 tablet by mouth every 6 (six) hours as needed for moderate pain. 09/27/19   Dagoberto Ligas, PA-C    Physical Exam: Vitals:   10/01/19 0830 10/01/19 0845 10/01/19 0900 10/01/19 0947  BP: (!) 159/143 (!) 151/110 (!) 160/114 (!) 157/110  Pulse: (!) 118 (!) 117 (!) 114 (!) 110  Resp: (!) 29 (!) 33 (!) 36 (!) 34  Temp:      TempSrc:      SpO2: 94% 100% 100% 100%     . General:  Critically ill with respiratory failure, gurgling from secretions, intermittent cough . Eyes:  PERRL, EOMI, normal lids, iris . ENT:  grossly normal hearing, lips & tongue, mmm . Neck:  no LAD, masses or thyromegaly; +JVD . Cardiovascular:  RR with tachycardia, no m/r/g. No LE edema.  Marland Kitchen Respiratory:   Diffuse crackles, gurgling at times, coughing periodically.  Significantly increased respiratory effort. . Abdomen:  soft, NT, ND, NABS . Skin:  no rash or induration seen on limited exam . Musculoskeletal:  grossly normal tone BUE/BLE, good ROM, no bony abnormality . Psychiatric:  blunted mood and affect, speech fluent and appropriate but limited by dyspnea, AOx3 . Neurologic:  CN 2-12 grossly intact, moves all extremities in coordinated fashion    Radiological Exams on Admission: DG Chest Portable 1 View  Result Date: 10/01/2019 CLINICAL DATA:  Respiratory distress.  Central line. EXAM: PORTABLE CHEST 1 VIEW COMPARISON:  10/01/2019. FINDINGS: Right IJ line no with tip over SVC. Stable cardiomegaly. Diffuse severe bilateral pulmonary infiltrates are again noted. Small left pleural effusion cannot be excluded. No pneumothorax.  IMPRESSION: 1.  Right IJ line noted with tip over SVC. 2.  Stable cardiomegaly. 3. Diffuse severe bilateral pulmonary infiltrates again noted. No interim change. Small left pleural effusion cannot be excluded. Electronically Signed   By: Marcello Moores  Register   On: 10/01/2019  09:58   DG Chest Port 1 View  Result Date: 10/01/2019 CLINICAL DATA:  Shortness of breath EXAM: PORTABLE CHEST 1 VIEW COMPARISON:  12/31/2017 FINDINGS: Patchy airspace disease throughout both lungs. Heart is normal size. No effusions. No acute bony abnormality. IMPRESSION: Patchy bilateral airspace disease concerning for pneumonia. Electronically Signed   By: Rolm Baptise M.D.   On: 10/01/2019 02:40    EKG: Independently reviewed.  Sinus tachycardia with rate 121; nonspecific ST changes with no evidence of acute ischemia   Labs on Admission: I have personally reviewed the available labs and imaging studies at the time of the admission.  Pertinent labs:   ABG: 7.317/33.4/180.0/17.2 K+ 5.7 CO2 14 Glucose 131 BUN 139/Creatinine 35.76/GFR 1 WBC 15.6 Hgb 6.5 Lactate 2.2 Heme negative Respiratory panel PCR negative  Assessment/Plan Active Problems:   ESRD (end stage renal disease) (HCC)   Essential hypertension   Anemia of chronic kidney failure   Diabetes mellitus type II, uncontrolled (HCC)   SOB (shortness of breath)   Noncompliance by refusing service    -Patient presenting in acute respiratory failure associated with volume overload/pulmonary edema associated with lack of HD -He was previously admitted x 2 with fistula aneurysm formation and had ligation performed on 1/25 -He refused further access and refused HD -He presented to the ER with critical volume overload in need of emergent HD -He refused HD for several hours and by the time I saw him was starting to drown in his secretions -He continued to refuse HD - apparently he and his wife have developed a naturopathic plan for treatment of his ESRD although  they have not had time to fully implement this plan -Based on his progressive respiratory failure while in the ER and inability to tolerate HD, there really were only 2 options left to him - [facility] placement or hospice/comfort care -Eventually, the patient and his wife were willing to agree to [facility] placement with emergent HD for possibly multiple treatments -They were not in agreement with ongoing HD, just currently life-saving HD -Initially, I placed an emergent consult with Dr. Loanne Drilling for [facility] placement only -I also spoke with Dr. Justin Mend in an attempt to arrange for emergent HD -However, given his progressive respiratory failure and inability to arrange for immediate HD, the patient will instead be admitted to the ICU where HD can be arranged more quickly -[facility] will be happy to resume caring for this patient following stabilization of his currently critical volume overload situation -Ongoing discussion will need to take place, as the patient remains at very high risk for death should he continue to refuse short-term or long-term HD     Note: This patient has been tested and is negative for the novel coronavirus COVID-19.   Total critical care time: 75 minutes Critical care time was exclusive of separately billable procedures and treating other patients. Critical care was necessary to treat or prevent imminent or life-threatening deterioration. Critical care was time spent personally by me on the following activities: development of treatment plan with patient and/or surrogate as well as nursing, discussions with consultants, evaluation of patient's response to treatment, examination of patient, obtaining history from patient or surrogate, ordering and performing treatments and interventions, ordering and review of laboratory studies, ordering and review of radiographic studies, pulse oximetry and re-evaluation of patient's condition.   Karmen Bongo MD [facility]   How to contact the  [facility] Attending or Consulting provider [place] or covering provider during after hours [place], for  this patient?  1. Check the care team in [facility] and look for a) attending/consulting [facility] provider listed and b) the [facility] team listed 2. Log into [email or web address] and use [place]'s universal password to access. If you do not have the password, please contact the hospital operator. 3. Locate the [facility] provider you are looking for under [facility] and page to a number that you can be directly reached. 4. If you still have difficulty reaching the provider, please page the [facility] (Director on Call) for the Hospitalists listed on amion for assistance.   10/01/2019, 10:11 AM

## 2019-10-01 NOTE — ED Triage Notes (Signed)
Pt comes from home via [facility] ro SOB that has been going on all day, along with vomiting, pt is a dialysis pt, has missed his dialysis all week due to aneurysm in arm and recent surgery. 74% on RA upon EMS arrival, on non rebreather.

## 2019-10-01 NOTE — Procedures (Signed)
   I was present at this dialysis session, have reviewed the session itself and made  appropriate changes Kelly Splinter MD [place] pager [phone]   10/01/2019, 5:13 PM

## 2019-10-01 NOTE — Progress Notes (Signed)
Attempted to trial pt off of 100% NRB to HFNC 15L, SATs dropped from 100% to 79% with an increased WOB and an increase in BP. NRB placed back on pt. RT asked if pt was okay with being placed back on Bi-PAP, pt is refusing at this time. Coarse crackles heard bilaterally. RN aware, RT will continue to monitor.

## 2019-10-01 NOTE — Plan of Care (Signed)
  Problem: Education: Goal: Knowledge of General Education information will improve Description: Including pain rating scale, medication(s)/side effects and non-pharmacologic comfort measures Outcome: Progressing   Problem: Clinical Measurements: Goal: Will remain free from infection Outcome: Progressing Goal: Diagnostic test results will improve Outcome: Progressing Goal: Respiratory complications will improve Outcome: Progressing Goal: Cardiovascular complication will be avoided Outcome: Progressing   Problem: Nutrition: Goal: Adequate nutrition will be maintained Outcome: Progressing   

## 2019-10-02 DIAGNOSIS — Z5329 Procedure and treatment not carried out because of patient's decision for other reasons: Secondary | ICD-10-CM

## 2019-10-02 DIAGNOSIS — I161 Hypertensive emergency: Secondary | ICD-10-CM

## 2019-10-02 DIAGNOSIS — J810 Acute pulmonary edema: Secondary | ICD-10-CM

## 2019-10-02 LAB — RENAL FUNCTION PANEL
Albumin: 3.3 g/dL — ABNORMAL LOW (ref 3.5–5.0)
Albumin: 2.6 g/dL — ABNORMAL LOW (ref 3.5–5.0)
Anion gap: 23 — ABNORMAL HIGH (ref 5–15)
Anion gap: 18 — ABNORMAL HIGH (ref 5–15)
BUN: 75 mg/dL — ABNORMAL HIGH (ref 6–20)
BUN: 86 mg/dL — ABNORMAL HIGH (ref 6–20)
CO2: 21 mmol/L — ABNORMAL LOW (ref 22–32)
CO2: 24 mmol/L (ref 22–32)
Calcium: 8.5 mg/dL — ABNORMAL LOW (ref 8.9–10.3)
Calcium: 7.8 mg/dL — ABNORMAL LOW (ref 8.9–10.3)
Chloride: 92 mmol/L — ABNORMAL LOW (ref 98–111)
Chloride: 92 mmol/L — ABNORMAL LOW (ref 98–111)
Creatinine, Ser: 20.39 mg/dL — ABNORMAL HIGH (ref 0.61–1.24)
Creatinine, Ser: 24.3 mg/dL — ABNORMAL HIGH (ref 0.61–1.24)
GFR calc Af Amer: 3 mL/min — ABNORMAL LOW (ref >60)
GFR calc Af Amer: 2 mL/min — ABNORMAL LOW (ref >60)
GFR calc non Af Amer: 3 mL/min — ABNORMAL LOW (ref >60)
GFR calc non Af Amer: 2 mL/min — ABNORMAL LOW (ref >60)
Glucose, Bld: 99 mg/dL (ref 70–99)
Glucose, Bld: 96 mg/dL (ref 70–99)
Phosphorus: 9.3 mg/dL — ABNORMAL HIGH (ref 2.5–4.6)
Phosphorus: >30 mg/dL — ABNORMAL HIGH (ref 2.5–4.6)
Potassium: 4.2 mmol/L (ref 3.5–5.1)
Potassium: 5 mmol/L (ref 3.5–5.1)
Sodium: 136 mmol/L (ref 135–145)
Sodium: 134 mmol/L — ABNORMAL LOW (ref 135–145)

## 2019-10-02 LAB — URINE CULTURE: Culture: NO GROWTH

## 2019-10-02 LAB — HEPATITIS B SURFACE ANTIBODY, QUANTITATIVE: Hep B S AB Quant (Post): 12.6 m[IU]/mL (ref >9.9)

## 2019-10-02 LAB — CBC
HCT: 18.4 % — ABNORMAL LOW (ref 39.0–52.0)
HCT: 20.3 % — ABNORMAL LOW (ref 39.0–52.0)
HCT: 24.9 % — ABNORMAL LOW (ref 39.0–52.0)
Hemoglobin: 6 g/dL — CL (ref 13.0–17.0)
Hemoglobin: 6.7 g/dL — CL (ref 13.0–17.0)
Hemoglobin: 8.2 g/dL — ABNORMAL LOW (ref 13.0–17.0)
MCH: 29 pg (ref 26.0–34.0)
MCH: 29.6 pg (ref 26.0–34.0)
MCH: 29.7 pg (ref 26.0–34.0)
MCHC: 32.6 g/dL (ref 30.0–36.0)
MCHC: 33 g/dL (ref 30.0–36.0)
MCHC: 32.9 g/dL (ref 30.0–36.0)
MCV: 88.9 fL (ref 80.0–100.0)
MCV: 89.8 fL (ref 80.0–100.0)
MCV: 90.2 fL (ref 80.0–100.0)
Platelets: 229 10*3/uL (ref 150–400)
Platelets: 197 10*3/uL (ref 150–400)
Platelets: 216 10*3/uL (ref 150–400)
RBC: 2.07 MIL/uL — ABNORMAL LOW (ref 4.22–5.81)
RBC: 2.26 MIL/uL — ABNORMAL LOW (ref 4.22–5.81)
RBC: 2.76 MIL/uL — ABNORMAL LOW (ref 4.22–5.81)
RDW: 15.6 % — ABNORMAL HIGH (ref 11.5–15.5)
RDW: 14.9 % (ref 11.5–15.5)
RDW: 14.7 % (ref 11.5–15.5)
WBC: 12.3 10*3/uL — ABNORMAL HIGH (ref 4.0–10.5)
WBC: 10.9 10*3/uL — ABNORMAL HIGH (ref 4.0–10.5)
WBC: 11.3 10*3/uL — ABNORMAL HIGH (ref 4.0–10.5)
nRBC: 0 % (ref 0.0–0.2)
nRBC: 0 % (ref 0.0–0.2)
nRBC: 0 % (ref 0.0–0.2)

## 2019-10-02 LAB — GLUCOSE, CAPILLARY
Glucose-Capillary: 91 mg/dL (ref 70–99)
Glucose-Capillary: 91 mg/dL (ref 70–99)
Glucose-Capillary: 93 mg/dL (ref 70–99)
Glucose-Capillary: 128 mg/dL — ABNORMAL HIGH (ref 70–99)

## 2019-10-02 LAB — HEPATITIS B E ANTIGEN: Hep B E Ag: NEGATIVE

## 2019-10-02 LAB — PREPARE RBC (CROSSMATCH)

## 2019-10-02 MED ORDER — HEPARIN SODIUM (PORCINE) 1000 UNIT/ML IJ SOLN
INTRAMUSCULAR | Status: AC
  Administered 2019-10-02: 2400 [IU] via INTRAVENOUS_CENTRAL
  Filled 2019-10-02: qty 3

## 2019-10-02 MED ORDER — ACETAMINOPHEN 325 MG PO TABS
ORAL_TABLET | ORAL | Status: AC
  Filled 2019-10-02: qty 2

## 2019-10-02 MED ORDER — AMLODIPINE BESYLATE 10 MG PO TABS
10.0000 mg | ORAL_TABLET | Freq: Every day | ORAL | Status: DC
  Administered 2019-10-03 – 2019-10-05 (×3): 10 mg via ORAL
  Filled 2019-10-02 (×3): qty 1

## 2019-10-02 MED ORDER — SODIUM CHLORIDE 0.9% IV SOLUTION: Freq: Once | INTRAVENOUS | Status: AC

## 2019-10-02 MED ORDER — CARVEDILOL 12.5 MG PO TABS
6.2500 mg | ORAL_TABLET | Freq: Two times a day (BID) | ORAL | Status: DC
  Administered 2019-10-02 – 2019-10-03 (×2): 6.25 mg via ORAL
  Filled 2019-10-02 (×2): qty 1

## 2019-10-02 MED ORDER — AMLODIPINE BESYLATE 5 MG PO TABS
5.0000 mg | ORAL_TABLET | Freq: Once | ORAL | Status: AC
  Administered 2019-10-02: 5 mg via ORAL
  Filled 2019-10-02: qty 1

## 2019-10-02 MED ORDER — CARVEDILOL 3.125 MG PO TABS
3.1250 mg | ORAL_TABLET | Freq: Two times a day (BID) | ORAL | Status: DC
  Administered 2019-10-02: 3.125 mg via ORAL
  Filled 2019-10-02 (×3): qty 1

## 2019-10-02 MED ORDER — RAMELTEON 8 MG PO TABS
8.0000 mg | ORAL_TABLET | Freq: Every day | ORAL | Status: DC
  Administered 2019-10-02 – 2019-10-04 (×3): 8 mg via ORAL
  Filled 2019-10-02 (×5): qty 1

## 2019-10-02 MED ORDER — AMLODIPINE BESYLATE 5 MG PO TABS
5.0000 mg | ORAL_TABLET | Freq: Every day | ORAL | Status: DC
  Administered 2019-10-02: 5 mg via ORAL
  Filled 2019-10-02: qty 1

## 2019-10-02 NOTE — Progress Notes (Signed)
RT note: patient resting comfortably on nasal cannula at this time with no distress noted.  Bipap currently not indicated.  Will continue to monitor.

## 2019-10-02 NOTE — Progress Notes (Addendum)
[facility] Progress Note  Subjective: 3L off w/ HD yest, breathing better  Vitals:   10/02/19 0645 10/02/19 0700 10/02/19 0752 10/02/19 0800  BP: 140/90 (!) 147/92  (!) 138/98  Pulse: (!) 109 (!) 106  (!) 108  Resp: 19 16  17   Temp: 98.5 F (36.9 C)  98.6 F (37 C)   TempSrc: Oral  Oral   SpO2: 93% 100%  93%  Weight:        Exam: General: large framed AAM, no distress, lying 30Deg Lungs:  occ bibasilar crackles, o/w clear Heart: tachy reg Abdomen: soft NT + BS M-S:  muscular Lower  1 + LE edema Neuro: awakens easily Dialysis Access: right IJ temp cath - old ligated left AVF no bruit  Dialysis:  MWF [facility]    4.5h  F200  2/2 bath  105kg  425/800  Hep 10,000   - last HD tmt 1/13   - mircera 100 q 2 weeks- last dose 1/10 ,  hgb 10.3 1/13   - venofer 50/week   - calcitriol 4.5  - parsabiv 7.5  Assessment/Plan: 1. Acute hypoxic respiratory failure - secondary to Pulmonary edema/ poss PNA, culture done - emperic antibiotics started 2. ESRD - pt wanted to quit dialysis and was dc'd from ED last time here w/ no access. Back now w/ severe uremia, metabolic acidosis and SOB / vol overload. Temp cath placed and pt had HD yest w/ 3L off, plan HD again today.   3. HTN/volume  - vol overload w/ uncont HTN, still requiring IV NTG. Plan HD again today   4. Anemia  - hgb low at 6.8, has had 2u prbc's here so far. Will order another 1u for today on HD 5. Metabolic bone disease -  parsabiv not available here - start binders when eating 6. Goals of care - when more stable need frank dicussion about goals of care and need for permanent access and interim [place] 10/02/2019, 8:37 AM  Inpatient medications: . sodium chloride   Intravenous Once  . chlorhexidine  15 mL Mouth Rinse BID  . Chlorhexidine Gluconate Cloth  6 each Topical Q0600  . mouth rinse  15 mL Mouth Rinse q12n4p  . pantoprazole (PROTONIX) IV  40 mg Intravenous QHS   . sodium chloride    .  sodium chloride    . sodium chloride    . azithromycin Stopped (10/02/19 0604)  . cefTRIAXone (ROCEPHIN)  IV Stopped (10/02/19 0452)  . nitroGLYCERIN 200 mcg/min (10/02/19 0800)   sodium chloride, sodium chloride, acetaminophen, alteplase, heparin, lidocaine (PF), lidocaine-prilocaine, nitroGLYCERIN, pentafluoroprop-tetrafluoroeth Recent Labs  Lab 10/01/19 1630 10/02/19 0314  NA 136 134*  K 4.2 5.0  CL 92* 92*  CO2 21* 24  GLUCOSE 99 96  BUN 75* 86*  CREATININE 20.39* 24.30*  CALCIUM 8.5* 7.8*  PHOS 9.3* >30.0*

## 2019-10-02 NOTE — Progress Notes (Signed)
eLink Physician-Brief Progress Note Patient Name: Patrick Ibarra DOB: 04-29-1981 MRN: [id number]   Date of Service  10/02/2019  HPI/Events of Note  Notified of Hgb 6 No sign of active bleed. HR 102, BP 142/99  eICU Interventions  Ordered to transfuse 1 unit PRBC     Intervention Category Major Interventions: Other:  Judd Lien 10/02/2019, 4:53 AM

## 2019-10-02 NOTE — Progress Notes (Signed)
eLink Physician-Brief Progress Note Patient Name: ASHRITH MIELNICKI DOB: 09-Feb-1981 MRN: [id number]   Date of Service  10/02/2019  HPI/Events of Note  Notified of patient request for sleep aide.  Melatonin has not helped in the past.   eICU Interventions  Ramelteon ordered.     Intervention Category Minor Interventions: Other:  Elsie Lincoln 10/02/2019, 9:50 PM

## 2019-10-02 NOTE — Progress Notes (Signed)
BIPAP at bedside on STBY patient on NCAN 2L and resting comfortably. RT to cont to monitor.

## 2019-10-02 NOTE — Progress Notes (Signed)
Pt to hemodialysis at this time. Transported by this RN and transporter via bed on cardiac monitoring. Report given to HD RN. Per attending, pt appropriate to send to dialysis department for HD session. Wife called as requested.

## 2019-10-02 NOTE — Progress Notes (Signed)
NAME:  Patrick Ibarra, MRN:  [id number], DOB:  [birth year], LOS: 1 ADMISSION DATE:  10/01/2019, CONSULTATION DATE:  10/01/19 REFERRING MD:  Karmen Bongo, MD CHIEF COMPLAINT:  Dialysis access  Brief History   39 year old male with ESRD on HD who presents with shortness of breath after missing dialysis x 1 week. Found in acute hypoxemic respiratory failure, hyperkalemia and metabolic acidosis. PCCM consulted for dialysis access.  History of present illness   Patrick Ibarra is a 39 year old male with ESRD on HD who presents with shortness of breath after missing dialysis x 1 week and found in acute hypoxemic respiratory failure, hyperkalemia and metabolic acidosis. Of note, he recently underwent ligation of left arm AVF with excision of fistrula aneurysm on 09/26/19.  In the ED, he had O2 saturations to 79% which improved on NRB. Labs significant for K 6.0, CO2 14 and BUN/Cr 139/35. WBC 15.6 and and Hg 6.8. CXR with patchy bilateral airspace diseaseHe was given lasix and started on antibiotics. He was also started on nitro gtt for hypertensive emergency. PCCM and Nephrology consulted.  Past Medical History  ESRD, HTN, DM2  Significant Hospital Events   1/29 Admitted to [facility]  Consults:  PCCM  Procedures:  Pending Vas Cath  Significant Diagnostic Tests:  CXR 1/29 - Patchy bilateral airspace disease  Micro Data:  SARS-COVID 1/29 - negative  Antimicrobials:  Ceftriaxone 1/29> Azithro 1/29>  Interim history/subjective:  Hg drop overnight, transfused 1 unit Remain on NTG drip  Objective   Blood pressure (!) 138/98, pulse (!) 108, temperature 98.6 F (37 C), temperature source Oral, resp. rate 17, weight 109.3 kg, SpO2 93 %.    Vent Mode: BIPAP;PCV FiO2 (%):  [50 %-60 %] 50 % Set Rate:  [10 bmp] 10 bmp PEEP:  [5 cmH20] 5 cmH20   Intake/Output Summary (Last 24 hours) at 10/02/2019 [id number] Last data filed at 10/02/2019 0800 Gross per 24 hour  Intake 2168.87 ml  Output 250 ml  Net  1918.87 ml   Filed Weights   10/01/19 1045 10/02/19 0423  Weight: 112.5 kg 109.3 kg    Physical Exam: General: Obese male, NAD HENT: [place]/AT, PERRL, EOM-I and MMM,  in place Respiratory: Mild tachypnea but clear Cardiovascular: RRR, Nl S1/S2 and -M/R/G GI: Soft, NT, ND and +BS Extremities: LUE AVF, 1+ lower extremity edema Neuro: Alert and interactive, moving all ext to command Skin: Intact, no rashes or bruising Psych: Normal mood, normal affect  I reviewed CXR myself, pulmonary edema noted  Discussed with bedside RN and RT  Resolved Hospital Problem list     Assessment & Plan:   Acute hypoxemic respiratory failure secondary to volume overload --HD catheter placed --Continue CAP coverage for now --F/U on cultures --Change BiPAP to PRN  Hyperkalemia, overload --HD per renal --BMET in AM  HTN emergency --On nitro gtt, attempt to remove once PO medications are in place -- Start norvasc at half home dose 5 mg daily with holding parameters -- Start Coreg at half home dose 3.125 BID with holding parameters  Acute on chronic anemia --Transfuse 1 U PRBC during dialysis --Trend CBC  Hold in the ICU while requiring BiPAP and titratable drip for HTN emergency  Remains on BP drips, will increase anti-HTN to home PO doses and continue to monitor, hold in the ICU  Best practice:  Diet: NPO Pain/Anxiety/Delirium protocol (if indicated): -- VAP protocol (if indicated): -- DVT prophylaxis: Per primary GI prophylaxis: Per primary Glucose control: Per  primary Mobility: BR Code Status: Full. Confirmed at bedside. Family Communication: Updated patient at bedside Disposition: ICU  Labs   CBC: Recent Labs  Lab 09/26/19 0731 09/26/19 0731 10/01/19 0228 10/01/19 0305 10/01/19 0354 10/01/19 1031 10/01/19 1129 10/01/19 1630 10/02/19 0314  WBC 7.2  --  15.6*  --   --   --  22.1* 20.0* 12.3*  HGB 7.2*   < > 6.5*   < > 6.8* 12.2* 6.1* 7.7* 6.0*  HCT 23.1*   < > 20.1*    < > 20.0* 36.0* 19.0* 23.5* 18.4*  MCV 91.3  --  89.7  --   --   --  90.5 89.4 88.9  PLT 214  --  282  --   --   --  230 275 229   < > = values in this interval not displayed.    Basic Metabolic Panel: Recent Labs  Lab 09/27/19 0319 09/27/19 0319 09/27/19 0931 10/01/19 0228 10/01/19 0305 10/01/19 0354 10/01/19 1031 10/01/19 1129 10/01/19 1630 10/02/19 0314  NA 136   < >  --  138   < > 134* 133* 138 136 134*  K >7.5*   < > 6.0* 5.7*   < > 6.0* 7.2* 6.7* 4.2 5.0  CL 97*  --   --  94*  --   --   --  94* 92* 92*  CO2 18*  --   --  14*  --   --   --  19* 21* 24  GLUCOSE 152*   < > 101* 131*  --   --   --  134* 99 96  BUN 126*  --   --  139*  --   --   --  142* 75* 86*  CREATININE 31.58*  --   --  35.76*  --   --   --  34.27* 20.39* 24.30*  CALCIUM 7.4*  --   --  7.9*  --   --   --  7.7* 8.5* 7.8*  MG  --   --   --   --   --   --   --  2.5*  --   --   PHOS  --   --   --   --   --   --   --  >30.0* 9.3* >30.0*   < > = values in this interval not displayed.   GFR: Estimated Creatinine Clearance: 5.5 mL/min (A) (by C-G formula based on SCr of 24.3 mg/dL (H)). Recent Labs  Lab 10/01/19 0228 10/01/19 0440 10/01/19 1129 10/01/19 1451 10/01/19 1630 10/02/19 0314  PROCALCITON  --   --  3.34  --   --   --   WBC 15.6*  --  22.1*  --  20.0* 12.3*  LATICACIDVEN  --  2.2*  --  1.5  --   --     Liver Function Tests: Recent Labs  Lab 10/01/19 1129 10/01/19 1630 10/02/19 0314  AST 35  --   --   ALT 8  --   --   ALKPHOS 39  --   --   BILITOT 0.3  --   --   PROT 7.0  --   --   ALBUMIN 2.9* 3.3* 2.6*   No results for input(s): LIPASE, AMYLASE in the last 168 hours. No results for input(s): AMMONIA in the last 168 hours.  ABG    Component Value Date/Time   PHART 7.284 (L) 10/01/2019 1031  PCO2ART 40.9 10/01/2019 1031   PO2ART 70.0 (L) 10/01/2019 1031   HCO3 19.4 (L) 10/01/2019 1031   TCO2 21 (L) 10/01/2019 1031   ACIDBASEDEF 7.0 (H) 10/01/2019 1031   O2SAT 92.0  10/01/2019 1031     Coagulation Profile: Recent Labs  Lab 10/01/19 1129  INR 1.1    Cardiac Enzymes: No results for input(s): CKTOTAL, CKMB, CKMBINDEX, TROPONINI in the last 168 hours.  HbA1C: Hemoglobin A1C  Date/Time Value Ref Range Status  06/10/2019 01:59 PM 4.7 4.0 - 5.6 % Final  09/08/2017 01:49 PM 4.9  Final   Hgb A1c MFr Bld  Date/Time Value Ref Range Status  03/01/2017 03:53 AM 5.8 (H) 4.8 - 5.6 % Final    Comment:    (NOTE)         Pre-diabetes: 5.7 - 6.4         Diabetes: >6.4         Glycemic control for adults with diabetes: <7.0   12/21/2013 05:10 AM 7.5 (H) <5.7 % Final    Comment:    (NOTE)                                                                       According to the ADA Clinical Practice Recommendations for 2011, when HbA1c is used as a screening test:  >=6.5%   Diagnostic of Diabetes Mellitus           (if abnormal result is confirmed) 5.7-6.4%   Increased risk of developing Diabetes Mellitus References:Diagnosis and Classification of Diabetes Mellitus,Diabetes [id number] 1):S62-S69 and Standards of Medical Care in         Diabetes - 2011,Diabetes Care,2011,34 (Suppl 1):S11-S61.    CBG: Recent Labs  Lab 10/01/19 1555 10/01/19 1942 10/01/19 2329 10/02/19 0340 10/02/19 0751  GLUCAP 88 82 87 91 91   The patient is critically ill with multiple organ systems failure and requires high complexity decision making for assessment and support, frequent evaluation and titration of therapies, application of advanced monitoring technologies and extensive interpretation of multiple databases.   Critical Care Time devoted to patient care services described in this note is  31  Minutes. This time reflects time of care of this signee Dr Jennet Maduro. This critical care time does not reflect procedure time, or teaching time or supervisory time of PA/NP/Med student/Med Resident etc but could involve care discussion time.  Rush Farmer,  M.D. [facility] Pulmonary/Critical Care Medicine.

## 2019-10-03 ENCOUNTER — Inpatient Hospital Stay (HOSPITAL_COMMUNITY): Payer: Medicare Other

## 2019-10-03 ENCOUNTER — Encounter (HOSPITAL_COMMUNITY): Payer: Medicare Other

## 2019-10-03 DIAGNOSIS — E11649 Type 2 diabetes mellitus with hypoglycemia without coma: Secondary | ICD-10-CM

## 2019-10-03 LAB — GLUCOSE, CAPILLARY
Glucose-Capillary: 105 mg/dL — ABNORMAL HIGH (ref 70–99)
Glucose-Capillary: 116 mg/dL — ABNORMAL HIGH (ref 70–99)
Glucose-Capillary: 134 mg/dL — ABNORMAL HIGH (ref 70–99)
Glucose-Capillary: 83 mg/dL (ref 70–99)
Glucose-Capillary: 84 mg/dL (ref 70–99)
Glucose-Capillary: 91 mg/dL (ref 70–99)
Glucose-Capillary: 97 mg/dL (ref 70–99)

## 2019-10-03 LAB — BASIC METABOLIC PANEL
Anion gap: 16 — ABNORMAL HIGH (ref 5–15)
BUN: 47 mg/dL — ABNORMAL HIGH (ref 6–20)
CO2: 24 mmol/L (ref 22–32)
Calcium: 7.8 mg/dL — ABNORMAL LOW (ref 8.9–10.3)
Chloride: 92 mmol/L — ABNORMAL LOW (ref 98–111)
Creatinine, Ser: 17.06 mg/dL — ABNORMAL HIGH (ref 0.61–1.24)
GFR calc Af Amer: 4 mL/min — ABNORMAL LOW (ref >60)
GFR calc non Af Amer: 3 mL/min — ABNORMAL LOW (ref >60)
Glucose, Bld: 102 mg/dL — ABNORMAL HIGH (ref 70–99)
Potassium: 3.8 mmol/L (ref 3.5–5.1)
Sodium: 132 mmol/L — ABNORMAL LOW (ref 135–145)

## 2019-10-03 LAB — CBC
HCT: 22.6 % — ABNORMAL LOW (ref 39.0–52.0)
Hemoglobin: 7.5 g/dL — ABNORMAL LOW (ref 13.0–17.0)
MCH: 29.6 pg (ref 26.0–34.0)
MCHC: 33.2 g/dL (ref 30.0–36.0)
MCV: 89.3 fL (ref 80.0–100.0)
Platelets: 211 10*3/uL (ref 150–400)
RBC: 2.53 MIL/uL — ABNORMAL LOW (ref 4.22–5.81)
RDW: 14.7 % (ref 11.5–15.5)
WBC: 10.3 10*3/uL (ref 4.0–10.5)
nRBC: 0 % (ref 0.0–0.2)

## 2019-10-03 LAB — MAGNESIUM: Magnesium: 2 mg/dL (ref 1.7–2.4)

## 2019-10-03 LAB — PHOSPHORUS: Phosphorus: 8 mg/dL — ABNORMAL HIGH (ref 2.5–4.6)

## 2019-10-03 MED ORDER — LABETALOL HCL 5 MG/ML IV SOLN
10.0000 mg | INTRAVENOUS | Status: DC | PRN
  Administered 2019-10-03: 10 mg via INTRAVENOUS
  Administered 2019-10-04: 04:00:00 20 mg via INTRAVENOUS
  Administered 2019-10-05: 10 mg via INTRAVENOUS
  Filled 2019-10-03 (×3): qty 4

## 2019-10-03 MED ORDER — HYDRALAZINE HCL 50 MG PO TABS
50.0000 mg | ORAL_TABLET | Freq: Three times a day (TID) | ORAL | Status: DC
  Administered 2019-10-03 – 2019-10-05 (×6): 50 mg via ORAL
  Filled 2019-10-03 (×6): qty 1

## 2019-10-03 MED ORDER — CARVEDILOL 25 MG PO TABS: 25.0000 mg | ORAL_TABLET | Freq: Two times a day (BID) | ORAL | Status: DC

## 2019-10-03 MED ORDER — CARVEDILOL 25 MG PO TABS
25.0000 mg | ORAL_TABLET | Freq: Two times a day (BID) | ORAL | Status: DC
  Administered 2019-10-03 – 2019-10-05 (×5): 25 mg via ORAL
  Filled 2019-10-03 (×6): qty 1

## 2019-10-03 MED ORDER — CHLORHEXIDINE GLUCONATE CLOTH 2 % EX PADS
6.0000 | MEDICATED_PAD | Freq: Every day | CUTANEOUS | Status: DC
  Administered 2019-10-03 – 2019-10-04 (×2): 6 via TOPICAL

## 2019-10-03 NOTE — Consult Note (Signed)
Hospital Consult    Reason for Consult: New dialysis access Referring Physician: Dr. Jonnie Finner MRN #:  [id number]  History of Present Illness: This is a 39 y.o. male recently underwent ligation of left arm fistula with excision of necrotic pseudoaneurysm.  At that time patient wanted noted dialysis access.  He is now readmitted with respiratory distress now requires new access.   Past Medical History:  Diagnosis Date  . Chronic kidney disease    dialysis M-W-F  . Diabetes mellitus without complication (HCC)    type 2, diet controlled  . Hypertension   . LV dysfunction   . Obesity     Past Surgical History:  Procedure Laterality Date  . AV FISTULA PLACEMENT Left 04/10/2017   Procedure: ARTERIOVENOUS (AV) FISTULA CREATION LEFT ARM;  Surgeon: Conrad Brookhaven, MD;  Location: [place];  Service: Vascular;  Laterality: Left;  . ESOPHAGOGASTRODUODENOSCOPY (EGD) WITH PROPOFOL N/A 04/04/2017   Procedure: ESOPHAGOGASTRODUODENOSCOPY (EGD) WITH PROPOFOL;  Surgeon: Carol Ada, MD;  Location: [place];  Service: Endoscopy;  Laterality: N/A;  . FISTULA SUPERFICIALIZATION Left 06/22/2019   Procedure: 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 Primary repair.;  Surgeon: Angelia Mould, MD;  Location: [facility] OR;  Service: Vascular;  Laterality: Left;  . INSERTION OF DIALYSIS CATHETER Right 04/10/2017   Procedure: INSERTION OF DIALYSIS CATHETER - RIGHT INTERNAL JUGULAR PLACEMENT;  Surgeon: Conrad Polk, MD;  Location: [place];  Service: Vascular;  Laterality: Right;  . THROMBECTOMY AND REVISION OF ARTERIOVENTOUS (AV) GORETEX  GRAFT Left 09/26/2019   Procedure: Ligation left arm AV fistula with excision of left arm AV fistula aneurysm;  Surgeon: Elam Dutch, MD;  Location: [facility] OR;  Service: Vascular;  Laterality: Left;  . TONSILLECTOMY AND ADENOIDECTOMY      No Known Allergies  Prior to Admission medications   Medication Sig Start Date End Date Taking? Authorizing Provider   Accu-Chek FastClix Lancets MISC Use as instructed to check blood sugar once daily. E11.9 06/17/19   Ladell Pier, MD  amLODipine (NORVASC) 10 MG tablet Take 1 tablet (10 mg total) by mouth daily. 06/10/19   Ladell Pier, MD  Blood Glucose Monitoring Suppl (ACCU-CHEK GUIDE ME) w/Device KIT 1 kit by Does not apply route daily. Use as instructed to check blood sugar once daily. E11.9 06/17/19   Ladell Pier, MD  carvedilol (COREG) 3.125 MG tablet TAKE 1 TABLET (3.125 MG TOTAL) BY MOUTH 2 (TWO) TIMES DAILY WITH A MEAL. Patient not taking: Reported on 09/26/2019 10/01/18   Ladell Pier, MD  carvedilol (COREG) 6.25 MG tablet Take 6.25 mg by mouth 2 (two) times daily with a meal.    [provider]  glucose blood (ACCU-CHEK GUIDE) test strip Use as instructed to check blood sugar once daily. E11.9 06/17/19   Ladell Pier, MD  lovastatin (MEVACOR) 20 MG tablet Take 1 tablet (20 mg total) by mouth at bedtime. 06/12/19   Ladell Pier, MD  oxyCODONE-acetaminophen (PERCOCET/ROXICET) 5-325 MG tablet Take 1 tablet by mouth every 6 (six) hours as needed for moderate pain. 09/27/19   Dagoberto Ligas, PA-C    Social History   Socioeconomic History  . Marital status: Married    Spouse name: Not on file  . Number of children: Not on file  . Years of education: Not on file  . Highest education level: Not on file  Occupational History  . Not on file  Tobacco Use  . Smoking status: Never Smoker  .  Smokeless tobacco: Never Used  Substance and Sexual Activity  . Alcohol use: Not Currently  . Drug use: No  . Sexual activity: Yes  Other Topics Concern  . Not on file  Social History Narrative   He is married with step kids. He works at [employer].    Social Determinants of Health   Financial Resource Strain:   . Difficulty of Paying Living Expenses: Not on file  Food Insecurity:   . Worried About Charity fundraiser in the Last Year: Not on file  . Ran  Out of Food in the Last Year: Not on file  Transportation Needs:   . Lack of Transportation (Medical): Not on file  . Lack of Transportation (Non-Medical): Not on file  Physical Activity:   . Days of Exercise per Week: Not on file  . Minutes of Exercise per Session: Not on file  Stress:   . Feeling of Stress : Not on file  Social Connections:   . Frequency of Communication with Friends and Family: Not on file  . Frequency of Social Gatherings with Friends and Family: Not on file  . Attends Religious Services: Not on file  . Active Member of Clubs or Organizations: Not on file  . Attends Archivist Meetings: Not on file  . Marital Status: Not on file  Intimate Partner Violence:   . Fear of Current or Ex-Partner: Not on file  . Emotionally Abused: Not on file  . Physically Abused: Not on file  . Sexually Abused: Not on file     Family History  Problem Relation Age of Onset  . Hypertension Mother   . Diabetes Mother   . Hypertension Father     ROS:  No complaints today  Physical Examination  Vitals:   10/03/19 1015 10/03/19 1030  BP: 138/85 (!) 142/91  Pulse: 95 90  Resp: 19 15  Temp:    SpO2: 93% 98%   Body mass index is 31.85 kg/m.  General:  nad Pulmonary: normal non-labored breathing Cardiac: palpable bilateral radial pulses Abdomen:  soft, NT/ND, no masses Extremities: left arm incision healing well with sutures inplace Musculoskeletal: no muscle wasting or atrophy  Neurologic: A&O X 3; Appropriate Affect ; SENSATION: normal; MOTOR FUNCTION:  moving all extremities equally. Speech is fluent/normal   CBC    Component Value Date/Time   WBC 10.3 10/03/2019 0348   RBC 2.53 (L) 10/03/2019 0348   HGB 7.5 (L) 10/03/2019 0348   HGB 9.6 (L) 06/10/2019 1214   HCT 22.6 (L) 10/03/2019 0348   HCT 27.8 (L) 06/10/2019 1214   PLT 211 10/03/2019 0348   PLT 227 06/10/2019 1214   MCV 89.3 10/03/2019 0348   MCV 91 06/10/2019 1214   MCH 29.6 10/03/2019 0348    MCHC 33.2 10/03/2019 0348   RDW 14.7 10/03/2019 0348   RDW 14.5 06/10/2019 1214   LYMPHSABS 1.0 12/21/2013 0510   MONOABS 0.1 12/21/2013 0510   EOSABS 0.0 12/21/2013 0510   BASOSABS 0.0 12/21/2013 0510    BMET    Component Value Date/Time   NA 132 (L) 10/03/2019 0348   NA 134 03/18/2017 0942   K 3.8 10/03/2019 0348   CL 92 (L) 10/03/2019 0348   CO2 24 10/03/2019 0348   GLUCOSE 102 (H) 10/03/2019 0348   BUN 47 (H) 10/03/2019 0348   BUN 135 (HH) 03/18/2017 0942   CREATININE 17.06 (H) 10/03/2019 0348   CREATININE 3.16 (H) 01/05/2014 1459   CALCIUM 7.8 (L)  10/03/2019 0348   CALCIUM 7.4 (L) 03/01/2017 0752   GFRNONAA 3 (L) 10/03/2019 0348   GFRAA 4 (L) 10/03/2019 0348    COAGS: Lab Results  Component Value Date   INR 1.1 10/01/2019   INR 1.59 04/03/2017     Non-Invasive Vascular Imaging:   Vein mapping reordered  ASSESSMENT/PLAN: This is a 39 y.o. male status post ligation left arm AV fistula and excision of pseudoaneurysm.  Now in need of new dialysis access.  Vein mapping has been ordered for permanent access but patient again is indecisive on what he wants moving forward.  Would recommend palliative or psychiatry evaluation for plans moving forward.  We will plan access based on further decision-making when patient is agreeable.  Deniece Rankin C. Donzetta Matters, MD [facility] Office: [phone] Pager: [phone]

## 2019-10-03 NOTE — Progress Notes (Signed)
NAME:  CHANTE MIDENCE, MRN:  [id number], DOB:  06-May-1981, LOS: 2 ADMISSION DATE:  10/01/2019, CONSULTATION DATE:  10/01/19 REFERRING MD:  Karmen Bongo, MD CHIEF COMPLAINT:  Dialysis access  Brief History   39 year old male with ESRD on HD who presents with shortness of breath after missing dialysis x 1 week. Found in acute hypoxemic respiratory failure, hyperkalemia and metabolic acidosis. PCCM consulted for dialysis access.  History of present illness   Mr. Andry Barfuss is a 39 year old male with ESRD on HD who presents with shortness of breath after missing dialysis x 1 week and found in acute hypoxemic respiratory failure, hyperkalemia and metabolic acidosis. Of note, he recently underwent ligation of left arm AVF with excision of fistrula aneurysm on 09/26/19.  In the ED, he had O2 saturations to 79% which improved on NRB. Labs significant for K 6.0, CO2 14 and BUN/Cr 139/35. WBC 15.6 and and Hg 6.8. CXR with patchy bilateral airspace diseaseHe was given lasix and started on antibiotics. He was also started on nitro gtt for hypertensive emergency. PCCM and Nephrology consulted.  Past Medical History  ESRD, HTN, DM2  Significant Hospital Events   1/29 Admitted to [facility]  Consults:  PCCM  Procedures:  Pending Vas Cath  Significant Diagnostic Tests:  CXR 1/29 - Patchy bilateral airspace disease  Micro Data:  SARS-COVID 1/29 - negative  Antimicrobials:  Ceftriaxone 1/29> Azithro 1/29>  Interim history/subjective:  Remains on NTG for BP control  Objective   Blood pressure (!) 149/97, pulse 90, temperature 98.1 F (36.7 C), temperature source Oral, resp. rate 12, weight 109.5 kg, SpO2 100 %.        Intake/Output Summary (Last 24 hours) at 10/03/2019 0837 Last data filed at 10/03/2019 0600 Gross per 24 hour  Intake 2787.06 ml  Output 3200 ml  Net -412.94 ml   Filed Weights   10/02/19 1048 10/02/19 1400 10/03/19 0410  Weight: 110.7 kg 107.5 kg 109.5 kg   Physical  Exam: General: Obese male, resting comfortably in bed, NAD HENT: [place]/AT, PERRL, EOM-I and MMM,  in place Respiratory: Hypoxemic but clear bilaterally Cardiovascular: RRR, Nl S1/S2 and -M/R/G GI: Soft, NT, ND and +BS Extremities: LUE AVF, mild edema bilaterally Neuro: Alert and interactive, moving all ext to command Skin: Intact  I reviewed CXR myself, mild edema noted  Discussed with bedside RN and RT  Resolved Hospital Problem list     Assessment & Plan:   Acute hypoxemic respiratory failure secondary to volume overload - HD catheter placed, will defer to renal as to when to remove - Continue CAP coverage for now but place stop date on rocephin/zithromax at 8/5 days respectively - F/U on cultures - May remove BiPAP from room at this point - Patient in acute hypoxemic failure still, no O2 at home, will attempt to titrate to off if able for sat of 92-95% and continue volume negative - If unable to get off O2 then will need an ambulatory desaturation study prior to discharge for ?home O2  Hyperkalemia, overload - HD per renal, may go to HD unit - BMET in AM - Replace electrolytes as indicated  HTN emergency - On nitro gtt, attempt to remove once PO medications are in place - Increased norvasc to 10 mg daily (home dose) - Increased Coreg to 6.25 mg BID (home dose) - Add low dose PO hydralazine in an attempt to D/C titratable NGT, 10 mg QID with holding parameters  Acute on chronic anemia -  No need for further transfusion today, target is 7 - CBC in AM  Hold in the ICU for NTG drip that is titratable, if able to d/c in the afternoon will consider transfer to progressive care.  Best practice:  Diet: NPO Pain/Anxiety/Delirium protocol (if indicated): -- VAP protocol (if indicated): -- DVT prophylaxis: Per primary GI prophylaxis: Per primary Glucose control: Per primary Mobility: BR Code Status: Full. Confirmed at bedside. Family Communication: Updated patient at  bedside Disposition: ICU  Labs   CBC: Recent Labs  Lab 10/01/19 1630 10/02/19 0314 10/02/19 0822 10/02/19 1543 10/03/19 0348  WBC 20.0* 12.3* 10.9* 11.3* 10.3  HGB 7.7* 6.0* 6.7* 8.2* 7.5*  HCT 23.5* 18.4* 20.3* 24.9* 22.6*  MCV 89.4 88.9 89.8 90.2 89.3  PLT 275 229 197 216 [id number]    Basic Metabolic Panel: Recent Labs  Lab 10/01/19 0228 10/01/19 0305 10/01/19 1031 10/01/19 1129 10/01/19 1630 10/02/19 0314 10/03/19 0348  NA 138   < > 133* 138 136 134* 132*  K 5.7*   < > 7.2* 6.7* 4.2 5.0 3.8  CL 94*  --   --  94* 92* 92* 92*  CO2 14*  --   --  19* 21* 24 24  GLUCOSE 131*  --   --  134* 99 96 102*  BUN 139*  --   --  142* 75* 86* 47*  CREATININE 35.76*  --   --  34.27* 20.39* 24.30* 17.06*  CALCIUM 7.9*  --   --  7.7* 8.5* 7.8* 7.8*  MG  --   --   --  2.5*  --   --  2.0  PHOS  --   --   --  >30.0* 9.3* >30.0* 8.0*   < > = values in this interval not displayed.   GFR: Estimated Creatinine Clearance: 7.8 mL/min (A) (by C-G formula based on SCr of 17.06 mg/dL (H)). Recent Labs  Lab 10/01/19 0228 10/01/19 0440 10/01/19 1129 10/01/19 1451 10/01/19 1630 10/02/19 0314 10/02/19 0822 10/02/19 1543 10/03/19 0348  PROCALCITON  --   --  3.34  --   --   --   --   --   --   WBC   < >  --  22.1*  --    < > 12.3* 10.9* 11.3* 10.3  LATICACIDVEN  --  2.2*  --  1.5  --   --   --   --   --    < > = values in this interval not displayed.    Liver Function Tests: Recent Labs  Lab 10/01/19 1129 10/01/19 1630 10/02/19 0314  AST 35  --   --   ALT 8  --   --   ALKPHOS 39  --   --   BILITOT 0.3  --   --   PROT 7.0  --   --   ALBUMIN 2.9* 3.3* 2.6*   No results for input(s): LIPASE, AMYLASE in the last 168 hours. No results for input(s): AMMONIA in the last 168 hours.  ABG    Component Value Date/Time   PHART 7.284 (L) 10/01/2019 1031   PCO2ART 40.9 10/01/2019 1031   PO2ART 70.0 (L) 10/01/2019 1031   HCO3 19.4 (L) 10/01/2019 1031   TCO2 21 (L) 10/01/2019 1031    ACIDBASEDEF 7.0 (H) 10/01/2019 1031   O2SAT 92.0 10/01/2019 1031     Coagulation Profile: Recent Labs  Lab 10/01/19 1129  INR 1.1    Cardiac Enzymes: No results for  input(s): CKTOTAL, CKMB, CKMBINDEX, TROPONINI in the last 168 hours.  HbA1C: Hemoglobin A1C  Date/Time Value Ref Range Status  06/10/2019 01:59 PM 4.7 4.0 - 5.6 % Final  09/08/2017 01:49 PM 4.9  Final   Hgb A1c MFr Bld  Date/Time Value Ref Range Status  03/01/2017 03:53 AM 5.8 (H) 4.8 - 5.6 % Final    Comment:    (NOTE)         Pre-diabetes: 5.7 - 6.4         Diabetes: >6.4         Glycemic control for adults with diabetes: <7.0   12/21/2013 05:10 AM 7.5 (H) <5.7 % Final    Comment:    (NOTE)                                                                       According to the ADA Clinical Practice Recommendations for 2011, when HbA1c is used as a screening test:  >=6.5%   Diagnostic of Diabetes Mellitus           (if abnormal result is confirmed) 5.7-6.4%   Increased risk of developing Diabetes Mellitus References:Diagnosis and Classification of Diabetes Mellitus,Diabetes [id number] 1):S62-S69 and Standards of Medical Care in         Diabetes - 2011,Diabetes Care,2011,34 (Suppl 1):S11-S61.    CBG: Recent Labs  Lab 10/02/19 1539 10/02/19 2035 10/03/19 0014 10/03/19 0406 10/03/19 0811  GLUCAP 93 128* 105* 91 84   The patient is critically ill with multiple organ systems failure and requires high complexity decision making for assessment and support, frequent evaluation and titration of therapies, application of advanced monitoring technologies and extensive interpretation of multiple databases.   Critical Care Time devoted to patient care services described in this note is  32  Minutes. This time reflects time of care of this signee Dr Jennet Maduro. This critical care time does not reflect procedure time, or teaching time or supervisory time of PA/NP/Med student/Med Resident etc but could  involve care discussion time.  Rush Farmer, M.D. [facility] Pulmonary/Critical Care Medicine.

## 2019-10-03 NOTE — Progress Notes (Signed)
[facility] Progress Note  Subjective: having headaches, no other c/o  Vitals:   10/03/19 0830 10/03/19 0845 10/03/19 0900 10/03/19 0915  BP: (!) 138/105 (!) 142/99 (!) 145/90 (!) 140/98  Pulse: 93 91 95 93  Resp: (!) 22 13 15 11   Temp:      TempSrc:      SpO2: 99% 95% 97% 97%  Weight:        Exam: General: large framed AAM, no distress, nasal O2 Lungs:  bibasilar crackles Heart: tachy reg Abdomen: soft NT + BS M-S:  muscular Lower  no sig LE edema Neuro:NF< ox3 Dialysis Access: right IJ temp cath - old ligated left AVF no bruit   Home meds:  - amlodipine 10/ carvedilol 3.125 bid  - lovastatin / oxycodone prn   CXR today sig improvement  Dialysis:  MWF [facility]    4.5h  F200  2/2 bath  105kg  425/800  Hep 10,000   - last HD tmt 1/13   - mircera 100 q 2 weeks- last dose 1/10 ,  hgb 10.3 1/13   - venofer 50/week   - calcitriol 4.5  - parsabiv 7.5  Assessment/Plan: 1. Acute hypoxic respiratory failure - secondary to Pulmonary edema/ poss PNA. Repeat CXR today after HD x 2 2. ESRD - pt wanted to quit dialysis and was dc'd from ED last time here w/ no access as AVF required ligation. Back now w/ severe uremia, metabolic acidosis and SOB/vol overload. Creat 36 on admit, down 24 yest and 17 today. Plan next HD tomorrow. Pt agreeable to [facility] eval for new TDC and new perm access. 3. HTN/volume  - vol overload w/ uncont HTN, still requiring IV NTG, would like to get this off today; ^coreg 25 bid, add po hydral 50 tid and IV labetalol q 4h prn.  4. Anemia  - hgb low at 6.8, has had 2u prbc's here so far. Will order another 1u for today on HD 5. Metabolic bone disease -  parsabiv not available here - start binders when eating 6. Goals of care - patient agreeable to new HD access as above     Rob Savior Himebaugh 10/03/2019, 9:56 AM  Inpatient medications: . sodium chloride   Intravenous Once  . amLODipine  10 mg Oral Daily  . carvedilol  6.25 mg Oral BID WC  .  chlorhexidine  15 mL Mouth Rinse BID  . Chlorhexidine Gluconate Cloth  6 each Topical Q0600  . mouth rinse  15 mL Mouth Rinse q12n4p  . ramelteon  8 mg Oral QHS   . sodium chloride    . sodium chloride    . sodium chloride    . azithromycin Stopped (10/03/19 0446)  . cefTRIAXone (ROCEPHIN)  IV Stopped (10/03/19 0430)  . nitroGLYCERIN 165 mcg/min (10/03/19 0900)   sodium chloride, sodium chloride, acetaminophen, alteplase, heparin, lidocaine (PF), lidocaine-prilocaine, nitroGLYCERIN, pentafluoroprop-tetrafluoroeth Recent Labs  Lab 10/02/19 0314 10/03/19 0348  NA 134* 132*  K 5.0 3.8  CL 92* 92*  CO2 24 24  GLUCOSE 96 102*  BUN 86* 47*  CREATININE 24.30* 17.06*  CALCIUM 7.8* 7.8*  PHOS >30.0* 8.0*

## 2019-10-03 NOTE — Progress Notes (Addendum)
Spoke w/ patient again about plans from here after he told [facility] he "might not want to" have new fistula placed.  Told patient that we need him to make a decision about dialysis vs hospice.  To be discharged this time he will have to have a TDC at the minimum, unless he is going to be dc'd to a hospice program w/ the expectancy of death.  If he is not sure about continuing dialysis he will still need at minimum a TDC in order to be dc'd. Patient seemed to understand.  He wants to meet with hospice but I suggested palliative care would be the best the way our system is set up and they can help him to figure out what he wants regarding dialysis vs hospice.  He said this past time when he refused HD access he was hoping that "my kidneys might gain some function", but I explained to him that this is extremely unlikely to happen now having been on HD for 2 years and that going without dialysis would only result in his getting very sick again, like this time, and needing dialysis again, or worse, dying at home from cardiac/ resp arrest.    Kelly Splinter, MD 10/03/2019, 7:36 PM

## 2019-10-04 ENCOUNTER — Inpatient Hospital Stay (HOSPITAL_COMMUNITY): Payer: Medicare Other

## 2019-10-04 DIAGNOSIS — Z01810 Encounter for preprocedural cardiovascular examination: Secondary | ICD-10-CM

## 2019-10-04 DIAGNOSIS — I10 Essential (primary) hypertension: Secondary | ICD-10-CM

## 2019-10-04 DIAGNOSIS — Z515 Encounter for palliative care: Secondary | ICD-10-CM

## 2019-10-04 DIAGNOSIS — J9601 Acute respiratory failure with hypoxia: Principal | ICD-10-CM

## 2019-10-04 DIAGNOSIS — R5383 Other fatigue: Secondary | ICD-10-CM

## 2019-10-04 DIAGNOSIS — Z7189 Other specified counseling: Secondary | ICD-10-CM

## 2019-10-04 LAB — CBC
HCT: 25.4 % — ABNORMAL LOW (ref 39.0–52.0)
Hemoglobin: 8.5 g/dL — ABNORMAL LOW (ref 13.0–17.0)
MCH: 30.1 pg (ref 26.0–34.0)
MCHC: 33.5 g/dL (ref 30.0–36.0)
MCV: 90.1 fL (ref 80.0–100.0)
Platelets: 278 10*3/uL (ref 150–400)
RBC: 2.82 MIL/uL — ABNORMAL LOW (ref 4.22–5.81)
RDW: 14.4 % (ref 11.5–15.5)
WBC: 10.6 10*3/uL — ABNORMAL HIGH (ref 4.0–10.5)
nRBC: 0 % (ref 0.0–0.2)

## 2019-10-04 LAB — BASIC METABOLIC PANEL
Anion gap: 17 — ABNORMAL HIGH (ref 5–15)
BUN: 56 mg/dL — ABNORMAL HIGH (ref 6–20)
CO2: 22 mmol/L (ref 22–32)
Calcium: 8 mg/dL — ABNORMAL LOW (ref 8.9–10.3)
Chloride: 94 mmol/L — ABNORMAL LOW (ref 98–111)
Creatinine, Ser: 18.79 mg/dL — ABNORMAL HIGH (ref 0.61–1.24)
GFR calc Af Amer: 3 mL/min — ABNORMAL LOW (ref >60)
GFR calc non Af Amer: 3 mL/min — ABNORMAL LOW (ref >60)
Glucose, Bld: 92 mg/dL (ref 70–99)
Potassium: 3.9 mmol/L (ref 3.5–5.1)
Sodium: 133 mmol/L — ABNORMAL LOW (ref 135–145)

## 2019-10-04 LAB — GLUCOSE, CAPILLARY
Glucose-Capillary: 99 mg/dL (ref 70–99)
Glucose-Capillary: 75 mg/dL (ref 70–99)
Glucose-Capillary: 91 mg/dL (ref 70–99)
Glucose-Capillary: 84 mg/dL (ref 70–99)
Glucose-Capillary: 117 mg/dL — ABNORMAL HIGH (ref 70–99)

## 2019-10-04 LAB — MAGNESIUM: Magnesium: 2 mg/dL (ref 1.7–2.4)

## 2019-10-04 LAB — PHOSPHORUS: Phosphorus: 8.4 mg/dL — ABNORMAL HIGH (ref 2.5–4.6)

## 2019-10-04 MED ORDER — HEPARIN SODIUM (PORCINE) 1000 UNIT/ML IJ SOLN
INTRAMUSCULAR | Status: AC
  Filled 2019-10-04: qty 1

## 2019-10-04 MED ORDER — CHLORHEXIDINE GLUCONATE 4 % EX LIQD
60.0000 mL | Freq: Once | CUTANEOUS | Status: DC
  Filled 2019-10-04: qty 60

## 2019-10-04 MED ORDER — HEPARIN SODIUM (PORCINE) 1000 UNIT/ML DIALYSIS: 7000.0000 [IU] | Freq: Once | INTRAMUSCULAR | Status: DC

## 2019-10-04 MED ORDER — CEFAZOLIN SODIUM-DEXTROSE 2-4 GM/100ML-% IV SOLN
2.0000 g | INTRAVENOUS | Status: DC
  Filled 2019-10-04: qty 100

## 2019-10-04 MED ORDER — SODIUM CHLORIDE 0.9 % IV SOLN: INTRAVENOUS | Status: DC

## 2019-10-04 MED ORDER — HEPARIN SODIUM (PORCINE) 5000 UNIT/ML IJ SOLN
5000.0000 [IU] | Freq: Three times a day (TID) | INTRAMUSCULAR | Status: DC
  Administered 2019-10-04 – 2019-10-05 (×3): 5000 [IU] via SUBCUTANEOUS
  Filled 2019-10-04 (×3): qty 1

## 2019-10-04 NOTE — Progress Notes (Signed)
[facility] Progress Note  Subjective: no complaints.  Continues to be unclear about goals of care.  [place] with dialysis today but tells me he does not plan for long term dialysis.  Also does not plan DNR.  I cannot elicit motivations.   Vitals:   10/04/19 0713 10/04/19 0800 10/04/19 0900 10/04/19 1000  BP:  (!) 153/102 (!) 140/100 (!) 166/102  Pulse:  98 (!) 104 (!) 102  Resp:  16 (!) 27 (!) 25  Temp: 98.8 F (37.1 C)     TempSrc: Oral     SpO2:  97% 97% 95%  Weight:        Exam: General: large framed AAM, no distress Lungs:  normal WOB Heart: tachy reg Abdomen: soft NT + BS M-S:  muscular Lower  no sig LE edema Neuro:NF< ox3, fluidly conversant Dialysis Access: right IJ temp cath - old ligated left AVF no bruit   Home meds:  - amlodipine 10/ carvedilol 3.125 bid  - lovastatin / oxycodone prn   CXR 1/31 sig improvement  Dialysis:  MWF [facility]    4.5h  F200  2/2 bath  105kg  425/800  Hep 10,000   - last HD tmt 1/13   - mircera 100 q 2 weeks- last dose 1/10 ,  hgb 10.3 1/13   - venofer 50/week   - calcitriol 4.5  - parsabiv 7.5  Assessment/Plan: 1. Acute hypoxic respiratory failure - secondary to Pulmonary edema/ poss PNA. CXR 1/31 improved. 2. ESRD - pt wanted to quit dialysis and was dc'd from ED last time here w/ no access as AVF required ligation. Presented agtain w/ severe uremia, metabolic acidosis and SOB/vol overload. Creat 36 on admit.  He is completely unclear on goals -- currently plans HD today then remove HD catheter is his desire, however he does not commit to hospice care or DNR > I explained several times these are discordant and I would not remove the catheter today unless he is very clearly going to be DNR and hospice.  I explained this to him several ways.  3. HTN/volume  - off NTG gtt since yesterday, improved to 140s today. 4. Anemia  - hgb low at 6.8, has had 2u prbc's; Hb today 8.5 5. Metabolic bone disease -  parsabiv not available here -  start binders when eating 6. Goals of care - Palliative care has been consulted.  I hope they are able to connect with him and help him figure out his [place].     Jannifer Hick MD [facility]   Inpatient medications: . sodium chloride   Intravenous Once  . amLODipine  10 mg Oral Daily  . carvedilol  25 mg Oral BID WC  . chlorhexidine  15 mL Mouth Rinse BID  . Chlorhexidine Gluconate Cloth  6 each Topical Q0600  . Chlorhexidine Gluconate Cloth  6 each Topical Q0600  . hydrALAZINE  50 mg Oral Q8H  . mouth rinse  15 mL Mouth Rinse q12n4p  . ramelteon  8 mg Oral QHS   . sodium chloride    . sodium chloride    . sodium chloride    . azithromycin Stopped (10/04/19 0442)  . cefTRIAXone (ROCEPHIN)  IV Stopped (10/04/19 0536)  . nitroGLYCERIN Stopped (10/03/19 1155)   sodium chloride, sodium chloride, acetaminophen, alteplase, heparin, labetalol, nitroGLYCERIN Recent Labs  Lab 10/03/19 0348 10/04/19 0405  NA 132* 133*  K 3.8 3.9  CL 92* 94*  CO2 24 22  GLUCOSE 102*  92  BUN 47* 56*  CREATININE 17.06* 18.79*  CALCIUM 7.8* 8.0*  PHOS 8.0* 8.4*

## 2019-10-04 NOTE — Progress Notes (Signed)
NAME:  Patrick Ibarra, MRN:  [id number], DOB:  1981-04-11, LOS: 3 ADMISSION DATE:  10/01/2019, CONSULTATION DATE:  10/01/19 REFERRING MD:  Karmen Bongo, MD CHIEF COMPLAINT:  Dialysis access  Brief History   39 year old male with ESRD on HD who presents with shortness of breath after missing dialysis x 1 week. Found in acute hypoxemic respiratory failure, hyperkalemia and metabolic acidosis. PCCM consulted for dialysis access requiring emergent iHD.   Past Medical History  ESRD, HTN, DM2  Significant Hospital Events   1/29 Admitted to [facility], iHD- 3L off  1/30 tx ICU requiring NTG gtt, transfused 1 unit PRBC  1/31 off NTG gtt  2/1 tx out of ICU  Consults:  PCCM  Procedures:  1/29 R IJ trialysis >>  Significant Diagnostic Tests:  CXR 1/29 - Patchy bilateral airspace disease  Micro Data:  1/29 SARS2 / flu A/ B >> neg 1/29 MRSA PCR >> neg 1/29 UC >> neg 1/29 BC x 2 >>    Antimicrobials:  Ceftriaxone 1/29> Azithro 1/29>  Interim history/subjective:  No complaints.  On room air.   Objective   Blood pressure (!) 166/102, pulse (!) 102, temperature 98.8 F (37.1 C), temperature source Oral, resp. rate (!) 25, weight 111.4 kg, SpO2 95 %.        Intake/Output Summary (Last 24 hours) at 10/04/2019 1035 Last data filed at 10/04/2019 0600 Gross per 24 hour  Intake 539.69 ml  Output 1000 ml  Net -460.31 ml   Filed Weights   10/02/19 1400 10/03/19 0410 10/04/19 0358  Weight: 107.5 kg 109.5 kg 111.4 kg   Physical Exam: General:  Adult male sitting in bed in NAD HEENT: MM pink/moist Neuro: Alert, oriented x 3, MAE CV: rr, no murmur PULM:  Non labored, clear, diminished in bases GI: soft, bs + Extremities: warm/dry, trace LE edema, prior LUE AVF Skin: no rashes   Resolved Hospital Problem list     Assessment & Plan:   Acute hypoxemic respiratory failure secondary to volume overload and CAP  - resolved with iHD P:  - rocephin/zithromax to complete 5 day therapy  -  Follow cultures - supplemental O2 prn, currently on RA - volume removal per iHD   Hyperkalemia Hypervolemia  ESRD, still makes some urine P:  - Per nephrology  - iHD plans for today, patient agrees to  - Trend BMP - Appreciate PMT assistance with GOCs- as patient has discordant plans- does not want to continue on dialysis long term but does not want to be DNR/ hospice.   - will need TDC prior to discharge    Prior left arm AV fistuala s/p ligation with excision of necrotic pseudoaneurysm P:  - [facility] consulted, vein mapping ordered   HTN emergency P:  - NGT gtt off since 1/31 - continue norvasc 10 mg daily  - continue coreg 25 mg BID - continue hydralazine 50 mg q 8 hrs - prn labetalol    Acute on chronic anemia - Hgb stable - transfuse for goal <7 - trend CBC   Best practice:  Diet: renal diet Pain/Anxiety/Delirium protocol (if indicated): n/a VAP protocol (if indicated): n/a DVT prophylaxis: Per primary GI prophylaxis: Per primary Glucose control: Per primary Mobility: BR Code Status: Full. Confirmed at bedside. Family Communication: Updated patient at bedside Disposition: ICU- tx to tele medsurg/ renal floor and to [facility] as primary as of 2/2  Labs   CBC: Recent Labs  Lab 10/02/19 0314 10/02/19 [id number] 10/02/19 1543 10/03/19 0348  10/04/19 0405  WBC 12.3* 10.9* 11.3* 10.3 10.6*  HGB 6.0* 6.7* 8.2* 7.5* 8.5*  HCT 18.4* 20.3* 24.9* 22.6* 25.4*  MCV 88.9 89.8 90.2 89.3 90.1  PLT 229 197 216 211 0000000    Basic Metabolic Panel: Recent Labs  Lab 10/01/19 1129 10/01/19 1630 10/02/19 0314 10/03/19 0348 10/04/19 0405  NA 138 136 134* 132* 133*  K 6.7* 4.2 5.0 3.8 3.9  CL 94* 92* 92* 92* 94*  CO2 19* 21* 24 24 22   GLUCOSE 134* 99 96 102* 92  BUN 142* 75* 86* 47* 56*  CREATININE 34.27* 20.39* 24.30* 17.06* 18.79*  CALCIUM 7.7* 8.5* 7.8* 7.8* 8.0*  MG 2.5*  --   --  2.0 2.0  PHOS >30.0* 9.3* >30.0* 8.0* 8.4*   GFR: Estimated Creatinine Clearance: 7.1  mL/min (A) (by C-G formula based on SCr of 18.79 mg/dL (H)). Recent Labs  Lab 10/01/19 0228 10/01/19 0440 10/01/19 1129 10/01/19 1451 10/01/19 1630 10/02/19 0822 10/02/19 1543 10/03/19 0348 10/04/19 0405  PROCALCITON  --   --  3.34  --   --   --   --   --   --   WBC   < >  --  22.1*  --    < > 10.9* 11.3* 10.3 10.6*  LATICACIDVEN  --  2.2*  --  1.5  --   --   --   --   --    < > = values in this interval not displayed.    Liver Function Tests: Recent Labs  Lab 10/01/19 1129 10/01/19 1630 10/02/19 0314  AST 35  --   --   ALT 8  --   --   ALKPHOS 39  --   --   BILITOT 0.3  --   --   PROT 7.0  --   --   ALBUMIN 2.9* 3.3* 2.6*   No results for input(s): LIPASE, AMYLASE in the last 168 hours. No results for input(s): AMMONIA in the last 168 hours.  ABG    Component Value Date/Time   PHART 7.284 (L) 10/01/2019 1031   PCO2ART 40.9 10/01/2019 1031   PO2ART 70.0 (L) 10/01/2019 1031   HCO3 19.4 (L) 10/01/2019 1031   TCO2 21 (L) 10/01/2019 1031   ACIDBASEDEF 7.0 (H) 10/01/2019 1031   O2SAT 92.0 10/01/2019 1031     Coagulation Profile: Recent Labs  Lab 10/01/19 1129  INR 1.1    Cardiac Enzymes: No results for input(s): CKTOTAL, CKMB, CKMBINDEX, TROPONINI in the last 168 hours.  HbA1C: Hemoglobin A1C  Date/Time Value Ref Range Status  06/10/2019 01:59 PM 4.7 4.0 - 5.6 % Final  09/08/2017 01:49 PM 4.9  Final   Hgb A1c MFr Bld  Date/Time Value Ref Range Status  03/01/2017 03:53 AM 5.8 (H) 4.8 - 5.6 % Final    Comment:    (NOTE)         Pre-diabetes: 5.7 - 6.4         Diabetes: >6.4         Glycemic control for adults with diabetes: <7.0   12/21/2013 05:10 AM 7.5 (H) <5.7 % Final    Comment:    (NOTE)  According to the ADA Clinical Practice Recommendations for 2011, when HbA1c is used as a screening test:  >=6.5%   Diagnostic of Diabetes Mellitus           (if abnormal result is  confirmed) 5.7-6.4%   Increased risk of developing Diabetes Mellitus References:Diagnosis and Classification of Diabetes Mellitus,Diabetes [id number] 1):S62-S69 and Standards of Medical Care in         Diabetes - 2011,Diabetes [id number] (Suppl 1):S11-S61.    CBG: Recent Labs  Lab 10/03/19 1611 10/03/19 1927 10/03/19 2346 10/04/19 0349 10/04/19 0711  [address], MSN, AGACNP-BC [facility] 10/04/2019, 10:36 AM

## 2019-10-04 NOTE — Plan of Care (Signed)
  Problem: Education: Goal: Knowledge of General Education information will improve Description: Including pain rating scale, medication(s)/side effects and non-pharmacologic comfort measures Outcome: Progressing   Problem: Coping: Goal: Level of anxiety will decrease Outcome: Progressing   

## 2019-10-04 NOTE — Consult Note (Signed)
Consultation Note Date: 10/04/2019   Patient Name: Patrick Ibarra  DOB: November 06, 1980  MRN: [id number]  Age / Sex: 39 y.o., male  PCP: Ladell Pier, MD Referring Physician: Rush Farmer, MD  Reason for Consultation: Establishing goals of care and Psychosocial/spiritual support  HPI/Patient Profile: 39 y.o. male   admitted on 10/01/2019 with significant past medical history of obestiy, HTN, LV dysfunction, DM2 and ESRD on HD/2 years.  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  Was admitted and pt underwent excision of aneurysm w/ ligation of the L AVF. Pt refused having any HD catheter placement. 3 mos ago he had a prior revision to the AVF and at that time was recommended to have a R arm AVF placed but pt declined.  Pt met w/ another [facility] doctor in Oct 2020 and again declined recommended R arm AVF placement.  Now pt is w/o HD access given he has refused to have new AVF placed, the old AVF is now ligated and he has refused HD cath placement. His last OP dialysis was on Sep 15, 2019 (12 days ago).      He reports to me "I am not perfect" with compliance with dialysis.  Patient is currently refusing HD cath placement.  He tells me he is concerned that the same thing that happened to his left arm will now happen to the right.    He is requesting a dialysis treatment today  before discharging home (he anticipates tomorrow), and hopes to begin an alternative/holistic approach to treating end-stage renal disease which does not include dialysis.   Patient and his family face the emotional, physical and financial struggles of living with serious life limiting illness.  He faces treatment option decisions, advanced directive decisions and anticipatory care needs.   Clinical Assessment and Goals of Care:   This NP Wadie Lessen reviewed medical records, received report from team, assessed  the patient and then meet at the patient's bedside along with his wife by telephone to discuss diagnosis, prognosis, GOC, EOL wishes disposition and options.  Concept of Hospice and Palliative Care were discussed  A  discussion was had today regarding advanced directives.  Concepts specific to code status, artifical feeding and hydration, continued IV antibiotics and rehospitalization was had.  The difference between a aggressive medical intervention path  and a palliative comfort care path for this patient at this time was had.  Values and goals of care important to patient and family were attempted to be elicited.  MOST form introduced  Patient was able to clearly articulate and explain his rationale for refusing dialysis at this time.  He is frustrated with the complications he has had with access.  Both he and his wife have contemplated in the past a more holistic approach to treat his end-stage renal disease.  Up until now he was moving forward with the recommended medical approach of dialysis.  However now secondary to the need for mapping/revascular placement for dialysis catheter he feels  that "now is the time" to "try" a holistic plan that has been presented to them.    Both he and his wife describe the plan as holistic, including spirituality, supplements, diet and exercise.  His family including his children support this approach along with their spiritual guidance in the community.  I spoke to both he and his wife concerning the fact that this is not recommended by any provider caring for him here during this hospitalization.  I expressed my concern that an end-stage renal disease patient that is not dialyzed, is high risk for decompensation and death within a relatively short period of time days to weeks.  Both patient and wife verbalizes an understanding of this but expressed their deep  'need" to try this alternative treatment plan now hoping to change the course of the disease.    Patient does verbalize that if indeed this plan does not work, and he decompensates, he would consider rehospitalization and initiation of dialysis once again at that time.  He "does not want to die"    Questions and concerns addressed.   Family encouraged to call with questions or concerns.    PMT will continue to support holistically.      NEXT OF KIN    no documented healthcare power of attorney or advanced directive.    Discussed with patient regardless of the treatment path he chooses the importance of advanced care planning.  He verbalizes understanding    SUMMARY OF RECOMMENDATIONS     Code Status/Advance Care Planning:  Full code  Psycho-social/Spiritual:   Desire for further Chaplaincy support:no  Additional Recommendations: Education on Hospice  Prognosis:   Unable to determine  Discharge Planning: Home with Palliative Services      Primary Diagnoses: Present on Admission: . SOB (shortness of breath) . ESRD (end stage renal disease) ([place]) . Anemia of chronic kidney failure . Diabetes mellitus type II, uncontrolled ([place]) . Essential hypertension   I have reviewed the medical record, interviewed the patient and family, and examined the patient. The following aspects are pertinent.  Past Medical History:  Diagnosis Date  . Chronic kidney disease    dialysis M-W-F  . Diabetes mellitus without complication (HCC)    type 2, diet controlled  . Hypertension   . LV dysfunction   . Obesity    Social History   Socioeconomic History  . Marital status: Married    Spouse name: Not on file  . Number of children: Not on file  . Years of education: Not on file  . Highest education level: Not on file  Occupational History  . Not on file  Tobacco Use  . Smoking status: Never Smoker  . Smokeless tobacco: Never Used  Substance and Sexual Activity  . Alcohol use: Not Currently  . Drug use: No  . Sexual activity: Yes  Other Topics Concern  . Not on file   Social History Narrative   He is married with step kids. He works at [employer].    Social Determinants of Health   Financial Resource Strain:   . Difficulty of Paying Living Expenses: Not on file  Food Insecurity:   . Worried About Charity fundraiser in the Last Year: Not on file  . Ran Out of Food in the Last Year: Not on file  Transportation Needs:   . Lack of Transportation (Medical): Not on file  . Lack of Transportation (Non-Medical): Not on file  Physical Activity:   . Days of Exercise  per Week: Not on file  . Minutes of Exercise per Session: Not on file  Stress:   . Feeling of Stress : Not on file  Social Connections:   . Frequency of Communication with Friends and Family: Not on file  . Frequency of Social Gatherings with Friends and Family: Not on file  . Attends Religious Services: Not on file  . Active Member of Clubs or Organizations: Not on file  . Attends Archivist Meetings: Not on file  . Marital Status: Not on file   Family History  Problem Relation Age of Onset  . Hypertension Mother   . Diabetes Mother   . Hypertension Father    Scheduled Meds: . sodium chloride   Intravenous Once  . amLODipine  10 mg Oral Daily  . carvedilol  25 mg Oral BID WC  . chlorhexidine  15 mL Mouth Rinse BID  . Chlorhexidine Gluconate Cloth  6 each Topical Q0600  . hydrALAZINE  50 mg Oral Q8H  . mouth rinse  15 mL Mouth Rinse q12n4p  . ramelteon  8 mg Oral QHS   Continuous Infusions: . sodium chloride    . sodium chloride    . sodium chloride    . azithromycin Stopped (10/04/19 0442)  . cefTRIAXone (ROCEPHIN)  IV Stopped (10/04/19 0536)  . nitroGLYCERIN Stopped (10/03/19 1155)   PRN Meds:.sodium chloride, sodium chloride, acetaminophen, alteplase, heparin, labetalol, nitroGLYCERIN Medications Prior to Admission:  Prior to Admission medications   Medication Sig Start Date End Date Taking? Authorizing Provider  Accu-Chek FastClix Lancets MISC Use as  instructed to check blood sugar once daily. E11.9 06/17/19   Ladell Pier, MD  amLODipine (NORVASC) 10 MG tablet Take 1 tablet (10 mg total) by mouth daily. 06/10/19   Ladell Pier, MD  Blood Glucose Monitoring Suppl (ACCU-CHEK GUIDE ME) w/Device KIT 1 kit by Does not apply route daily. Use as instructed to check blood sugar once daily. E11.9 06/17/19   Ladell Pier, MD  carvedilol (COREG) 3.125 MG tablet TAKE 1 TABLET (3.125 MG TOTAL) BY MOUTH 2 (TWO) TIMES DAILY WITH A MEAL. Patient not taking: Reported on 09/26/2019 10/01/18   Ladell Pier, MD  carvedilol (COREG) 6.25 MG tablet Take 6.25 mg by mouth 2 (two) times daily with a meal.    [provider]  glucose blood (ACCU-CHEK GUIDE) test strip Use as instructed to check blood sugar once daily. E11.9 06/17/19   Ladell Pier, MD  lovastatin (MEVACOR) 20 MG tablet Take 1 tablet (20 mg total) by mouth at bedtime. 06/12/19   Ladell Pier, MD  oxyCODONE-acetaminophen (PERCOCET/ROXICET) 5-325 MG tablet Take 1 tablet by mouth every 6 (six) hours as needed for moderate pain. 09/27/19   Dagoberto Ligas, PA-C   No Known Allergies Review of Systems  Constitutional: Positive for fatigue.    Physical Exam Constitutional:      Appearance: He is normal weight.  Neurological:     Mental Status: He is alert.     Vital Signs: BP (!) 166/102   Pulse (!) 102   Temp 98.8 F (37.1 C) (Oral)   Resp (!) 25   Wt 111.4 kg   SpO2 95%   BMI 32.40 kg/m  Pain Scale: 0-10   Pain Score: 0-No pain   SpO2: SpO2: 95 % O2 Device:SpO2: 95 % O2 Flow Rate: .O2 Flow Rate (L/min): 2 L/min  IO: Intake/output summary:   Intake/Output Summary (Last 24 hours) at 10/04/2019 1032 Last  data filed at 10/04/2019 0600 Gross per 24 hour  Intake 539.69 ml  Output 1000 ml  Net -460.31 ml    LBM: Last BM Date: 10/03/19 Baseline Weight: Weight: 112.5 kg Most recent weight: Weight: 111.4 kg     Palliative Assessment/Data:      Discussed with Dr Johnney Ou    Patient will have a dialysis treatment today.  Time In: 1200 Time Out: 1315 Time Total: 75 minutes Greater than 50%  of this time was spent counseling and coordinating care related to the above assessment and plan.  Signed by: Wadie Lessen, NP   Please contact Palliative Medicine Team phone at [phone] for questions and concerns.  For individual provider: See Shea Evans

## 2019-10-04 NOTE — Progress Notes (Signed)
Upper extremity vein mapping has been completed.   Preliminary results in CV Proc.   Abram Sander 10/04/2019 11:43 AM

## 2019-10-05 ENCOUNTER — Other Ambulatory Visit: Payer: Self-pay

## 2019-10-05 ENCOUNTER — Encounter (HOSPITAL_COMMUNITY): Payer: Self-pay | Admitting: Pulmonary Disease

## 2019-10-05 DIAGNOSIS — D649 Anemia, unspecified: Secondary | ICD-10-CM

## 2019-10-05 LAB — BPAM RBC
Blood Product Expiration Date: 202102232359
Blood Product Expiration Date: 202102242359
Blood Product Expiration Date: 202102242359
Blood Product Expiration Date: 202102242359
ISSUE DATE / TIME: 202101291439
ISSUE DATE / TIME: 202101300511
ISSUE DATE / TIME: 202101301148
Unit Type and Rh: 5100
Unit Type and Rh: 5100
Unit Type and Rh: 5100
Unit Type and Rh: 5100

## 2019-10-05 LAB — TYPE AND SCREEN
ABO/RH(D): O POS
Antibody Screen: NEGATIVE
Unit division: 0
Unit division: 0
Unit division: 0
Unit division: 0

## 2019-10-05 LAB — GLUCOSE, CAPILLARY: Glucose-Capillary: 87 mg/dL (ref 70–99)

## 2019-10-05 NOTE — Consult Note (Addendum)
   [facility] Inpatient Consult   10/05/2019  Patrick Ibarra [birth year] [id number]   Patient screened for high risk score for unplanned readmission score  and for less than 7 days admission hospitalizations to check if potential [place] [facility] services are needed.  Review of patient's medical record reveals patient is desireadmitted with acute hypoxic respiratory failure with a HX of ESRD.  Patient chart review of difficulty with Hemodialysis follow up.  Primary Care Provider is Karle Plumber, MD with [facility] and [place] is: [place] [facility] [employer]  Plan:  Follow up with inpatient [facility] RNCM regarding patient is in the [facility] and can be followed for transition of care and disease mangement. 3:00 pm Attempts to reach patient by phone at the bedside was currently unsuccessful.  Will continue to follow. 4:30 pm, another attempt to reach to patient at cell phone listed in demographics and left a generic voicemail requesting a return call. Continue to follow progress and disposition to assess for post hospital care management needs.    Please place a [facility] Care Management consult as appropriate and for questions contact:   Natividad Brood, RN BSN [facility] Liaison  [phone] business mobile phone Toll free office [phone]  Fax number: [phone] [email or web address]

## 2019-10-05 NOTE — Discharge Summary (Signed)
Physician Discharge Summary  Patrick Ibarra [id number] DOB: [birth year] DOA: 10/01/2019  PCP: Ladell Pier, MD  Admit date: 10/01/2019 Discharge date: 10/05/2019  Time spent: 45 minutes  Recommendations for Outpatient Follow-up:  Left AGAINST MEDICAL ADVICE without permanent dialysis access, has capacity however wishes to try holistic and alternative medicine for ESRD -Unfortunately we worry he will be back in extremis in the emergency room requiring emergent dialysis   Discharge Diagnoses:  Noncompliance   ESRD (end stage renal disease) ([place])   Essential hypertension   Anemia of chronic kidney failure   Diabetes mellitus type II, uncontrolled ([place])   DNR (do not resuscitate) discussion   SOB (shortness of breath)   Noncompliance by refusing service   Palliative care by specialist   Fatigue   Filed Weights   10/04/19 0358 10/04/19 1355 10/04/19 1736  Weight: 111.4 kg 109.1 kg 105.6 kg    History of present illness:  39 year old male with ESRD on hemodialysis, with history of noncompliance presented to the emergency room with acute hypoxic respiratory failure, hyperkalemia and metabolic acidosis after missing dialysis for a week He had a similar episode 3 months ago, and at that time was recommended to have new access placed in the right arm but he declined.  The AV fistula was ligated on 1/24 and he then refused HD catheter placement.  He was specifically counseled about the high risk of death associated with this decision and he was offered and declined palliative care and hospice.  He was discharged on 1/25. -On 1/29 he developed n/v with severe SOB.  Upon arrival in the ER he was 74% on RA and was placed on a NRB.  -Dr. Lorin Mercy had multiple discussions with patient and wife,  At 62 he was continuing to refuse HD.  At 527, his COVID test was negative and he was thought to have pulm edema.  Patient continued to refuse HD.  He was offered BIPAP and NTG and treated with PNA. Dr.  Christy Gentles spoke with his wife at that time.  At 615, Dr. Moshe Cipro was consulted.  Since the patient was refusing HD, she recommended 160 mg IV Lasix and NTG drip ongoing. He did not tolerate BIPAP.  He once again refused HD but declared himself to be a full code finally his respiratory distress worsened, his wife was called again over speaker phone and finally agreed to emergent HD catheter placement followed by dialysis  Hospital Course:   Acute hypoxic respiratory failure Pulmonary edema ESRD on hemodialysis with noncompliance -Patient presented to the emergency room with respiratory failure and hypoxia, symptoms of uremia from missed dialysis for over 1 week -On the day of his admission he continued to refuse hemodialysis and finally when he was in severe respiratory distress agreed to temporary HD catheter followed by dialysis -He was followed by nephrology this admission, initially admitted to the ICU, underwent few dialysis treatments, last HD was yesterday -Continues to decline permanent dialysis access and permacath also refused AV fistula placement by [facility] -He wishes to try holistic, alternative medicine approach -Numerous medical providers and nephrologist have counseled the patient about high risk of death should he continue to refuse dialysis unless he chooses to go the hospice route. -Palliative medicine was consulted, underwent palliative care meeting, his wife was also involved in these discussions, patient is AAO x3 however is extremely frustrated with his fistula complications in the past and at this time continues to make poor choices for his health and future.  All  this was discussed with patient's wife who also understands high risk of complications soon however she acknowledges and accepts his wish and desire to seek alternative treatment path. -He remains a full code and continues to decline hospice at this time. -Risk of death, cardiac arrhythmias, other cardiopulmonary  complications explained to patient multiple times -Anticipate he will be back again in extremis   Discharge Exam: Vitals:   10/05/19 0534 10/05/19 0933  BP: (!) 144/90 (!) 156/108  Pulse: (!) 105 100  Resp:  18  Temp: 99.8 F (37.7 C) 100 F (37.8 C)  SpO2: 95% 99%    General: AAOx3 Cardiovascular: S1S2/RRR Respiratory: CTAB  Discharge Instructions    Allergies as of 10/05/2019   No Known Allergies   Med Rec must be completed prior to using this SMARTLINK      No Known Allergies    The results of significant diagnostics from this hospitalization (including imaging, microbiology, ancillary and laboratory) are listed below for reference.    Significant Diagnostic Studies: DG CHEST PORT 1 VIEW  Result Date: 10/03/2019 CLINICAL DATA:  Respiratory distress EXAM: PORTABLE CHEST 1 VIEW COMPARISON:  Chest radiograph 10/01/2019 FINDINGS: Right IJ central venous catheter tip projects over the superior vena cava. Monitoring leads overlie the patient. Stable cardiac and mediastinal contours. Interval improvement in diffuse bilateral airspace opacities. No pleural effusion or pneumothorax. IMPRESSION: Significant interval improvement in bilateral airspace opacities. Electronically Signed   By: Lovey Newcomer M.D.   On: 10/03/2019 09:24   DG Chest Portable 1 View  Result Date: 10/01/2019 CLINICAL DATA:  Respiratory distress.  Central line. EXAM: PORTABLE CHEST 1 VIEW COMPARISON:  10/01/2019. FINDINGS: Right IJ line no with tip over SVC. Stable cardiomegaly. Diffuse severe bilateral pulmonary infiltrates are again noted. Small left pleural effusion cannot be excluded. No pneumothorax. IMPRESSION: 1.  Right IJ line noted with tip over SVC. 2.  Stable cardiomegaly. 3. Diffuse severe bilateral pulmonary infiltrates again noted. No interim change. Small left pleural effusion cannot be excluded. Electronically Signed   By: Marcello Moores  Register   On: 10/01/2019 09:58   DG Chest Port 1  View  Result Date: 10/01/2019 CLINICAL DATA:  Shortness of breath EXAM: PORTABLE CHEST 1 VIEW COMPARISON:  12/31/2017 FINDINGS: Patchy airspace disease throughout both lungs. Heart is normal size. No effusions. No acute bony abnormality. IMPRESSION: Patchy bilateral airspace disease concerning for pneumonia. Electronically Signed   By: Rolm Baptise M.D.   On: 10/01/2019 02:40   VAS [place] UPPER EXT VEIN MAPPING (PRE-OP AVF)  Result Date: 10/04/2019 UPPER EXTREMITY VEIN MAPPING  Indications: Pre-access. Comparison Study: 06/29/19 previous Performing Technologist: Abram Sander RVS  Examination Guidelines: A complete evaluation includes B-mode imaging, spectral Doppler, color Doppler, and power Doppler as needed of all accessible portions of each vessel. Bilateral testing is considered an integral part of a complete examination. Limited examinations for reoccurring indications may be performed as noted. +-----------------+-------------+----------+--------+ Right Cephalic   Diameter (cm)Depth (cm)Findings +-----------------+-------------+----------+--------+ Shoulder             0.29        0.91            +-----------------+-------------+----------+--------+ Prox upper arm       0.30        0.87            +-----------------+-------------+----------+--------+ Mid upper arm        0.36        0.50            +-----------------+-------------+----------+--------+  Dist upper arm       0.45        0.33            +-----------------+-------------+----------+--------+ Antecubital fossa    0.36        0.33            +-----------------+-------------+----------+--------+ Prox forearm         0.40        0.46            +-----------------+-------------+----------+--------+ Mid forearm          0.36        0.50            +-----------------+-------------+----------+--------+ Dist forearm         0.31        0.37            +-----------------+-------------+----------+--------+ Wrist                 0.30        0.32            +-----------------+-------------+----------+--------+ +-----------------+-------------+----------+----------------------------+ Right Basilic    Diameter (cm)Depth (cm)          Findings           +-----------------+-------------+----------+----------------------------+ Prox upper arm       0.65        1.67                                +-----------------+-------------+----------+----------------------------+ Mid upper arm        0.60        1.87                                +-----------------+-------------+----------+----------------------------+ Dist upper arm       0.52        0.73                                +-----------------+-------------+----------+----------------------------+ Antecubital fossa    0.47        0.65                                +-----------------+-------------+----------+----------------------------+ Prox forearm         0.33        0.53                                +-----------------+-------------+----------+----------------------------+ Mid forearm          0.26        0.67            branching           +-----------------+-------------+----------+----------------------------+ Distal forearm                          branching and not visualized +-----------------+-------------+----------+----------------------------+ +-----------------+-------------+----------+------------------+ Left Cephalic    Diameter (cm)Depth (cm)     Findings      +-----------------+-------------+----------+------------------+ Shoulder                                aneurysmal fistula +-----------------+-------------+----------+------------------+ Prox upper arm  aneurysmal fistula +-----------------+-------------+----------+------------------+ Mid upper arm                           aneurysmal fistula +-----------------+-------------+----------+------------------+ Dist upper arm                           aneurysmal fistula +-----------------+-------------+----------+------------------+ Antecubital fossa                       aneurysmal fistula +-----------------+-------------+----------+------------------+ Prox forearm         0.37        0.81                      +-----------------+-------------+----------+------------------+ Mid forearm          0.29        0.51                      +-----------------+-------------+----------+------------------+ Dist forearm         0.27        0.36                      +-----------------+-------------+----------+------------------+ Wrist                0.19        0.30                      +-----------------+-------------+----------+------------------+ +-----------------+-------------+----------+--------------+ Left Basilic     Diameter (cm)Depth (cm)   Findings    +-----------------+-------------+----------+--------------+ Shoulder                                not visualized +-----------------+-------------+----------+--------------+ Prox upper arm                          not visualized +-----------------+-------------+----------+--------------+ Mid upper arm                           not visualized +-----------------+-------------+----------+--------------+ Dist upper arm                          not visualized +-----------------+-------------+----------+--------------+ Antecubital fossa    0.65        0.69                  +-----------------+-------------+----------+--------------+ Prox forearm         0.35        0.69                  +-----------------+-------------+----------+--------------+ Mid forearm          0.25        0.41                  +-----------------+-------------+----------+--------------+ Distal forearm       0.19        0.35                  +-----------------+-------------+----------+--------------+ *See table(s) above for measurements and observations.   Diagnosing physician: Ruta Hinds MD Electronically signed by Ruta Hinds MD on 10/04/2019 at 5:06:25 PM.    Final     Microbiology: Recent Results (from the past 240 hour(s))  Respiratory Panel by RT PCR (  Flu A&B, Covid) - Nasopharyngeal Swab     Status: None   Collection Time: 09/26/19  6:57 AM   Specimen: Nasopharyngeal Swab  Result Value Ref Range Status   SARS Coronavirus 2 by RT PCR NEGATIVE NEGATIVE Final    Comment: (NOTE) SARS-CoV-2 target nucleic acids are NOT DETECTED. The SARS-CoV-2 RNA is generally detectable in upper respiratoy specimens during the acute phase of infection. The lowest concentration of SARS-CoV-2 viral copies this assay can detect is 131 copies/mL. A negative result does not preclude SARS-Cov-2 infection and should not be used as the sole basis for treatment or other patient management decisions. A negative result may occur with  improper specimen collection/handling, submission of specimen other than nasopharyngeal swab, presence of viral mutation(s) within the areas targeted by this assay, and inadequate number of viral copies (<131 copies/mL). A negative result must be combined with clinical observations, patient history, and epidemiological information. The expected result is Negative. Fact Sheet for Patients:  PinkCheek.be Fact Sheet for Healthcare Providers:  GravelBags.it This test is not yet ap proved or cleared by the [place] FDA and  has been authorized for detection and/or diagnosis of SARS-CoV-2 by FDA under an Emergency Use Authorization (EUA). This EUA will remain  in effect (meaning this test can be used) for the duration of the COVID-19 declaration under Section 564(b)(1) of the Act, 21 U.S.C. section 360bbb-3(b)(1), unless the authorization is terminated or revoked sooner.    Influenza A by PCR NEGATIVE NEGATIVE Final   Influenza B by PCR NEGATIVE NEGATIVE Final     Comment: (NOTE) The Xpert Xpress SARS-CoV-2/FLU/RSV assay is intended as an aid in  the diagnosis of influenza from Nasopharyngeal swab specimens and  should not be used as a sole basis for treatment. Nasal washings and  aspirates are unacceptable for Xpert Xpress SARS-CoV-2/FLU/RSV  testing. Fact Sheet for Patients: PinkCheek.be Fact Sheet for Healthcare Providers: GravelBags.it This test is not yet approved or cleared by the [place] FDA and  has been authorized for detection and/or diagnosis of SARS-CoV-2 by  FDA under an Emergency Use Authorization (EUA). This EUA will remain  in effect (meaning this test can be used) for the duration of the  Covid-19 declaration under Section 564(b)(1) of the Act, 21  U.S.C. section 360bbb-3(b)(1), unless the authorization is  terminated or revoked. Performed at [facility], [place] [address], [place], [place] [address]   Respiratory Panel by RT PCR (Flu A&B, Covid) - Nasopharyngeal Swab     Status: None   Collection Time: 10/01/19  2:30 AM   Specimen: Nasopharyngeal Swab  Result Value Ref Range Status   SARS Coronavirus 2 by RT PCR NEGATIVE NEGATIVE Final    Comment: (NOTE) SARS-CoV-2 target nucleic acids are NOT DETECTED. The SARS-CoV-2 RNA is generally detectable in upper respiratoy specimens during the acute phase of infection. The lowest concentration of SARS-CoV-2 viral copies this assay can detect is 131 copies/mL. A negative result does not preclude SARS-Cov-2 infection and should not be used as the sole basis for treatment or other patient management decisions. A negative result may occur with  improper specimen collection/handling, submission of specimen other than nasopharyngeal swab, presence of viral mutation(s) within the areas targeted by this assay, and inadequate number of viral copies (<131 copies/mL). A negative result must be combined with  clinical observations, patient history, and epidemiological information. The expected result is Negative. Fact Sheet for Patients:  PinkCheek.be Fact Sheet for Healthcare Providers:  GravelBags.it This test  is not yet ap proved or cleared by the [place] and  has been authorized for detection and/or diagnosis of SARS-CoV-2 by FDA under an Emergency Use Authorization (EUA). This EUA will remain  in effect (meaning this test can be used) for the duration of the COVID-19 declaration under Section 564(b)(1) of the Act, 21 U.S.C. section 360bbb-3(b)(1), unless the authorization is terminated or revoked sooner.    Influenza A by PCR NEGATIVE NEGATIVE Final   Influenza B by PCR NEGATIVE NEGATIVE Final    Comment: (NOTE) The Xpert Xpress SARS-CoV-2/FLU/RSV assay is intended as an aid in  the diagnosis of influenza from Nasopharyngeal swab specimens and  should not be used as a sole basis for treatment. Nasal washings and  aspirates are unacceptable for Xpert Xpress SARS-CoV-2/FLU/RSV  testing. Fact Sheet for Patients: PinkCheek.be Fact Sheet for Healthcare Providers: GravelBags.it This test is not yet approved or cleared by the [place] FDA and  has been authorized for detection and/or diagnosis of SARS-CoV-2 by  FDA under an Emergency Use Authorization (EUA). This EUA will remain  in effect (meaning this test can be used) for the duration of the  Covid-19 declaration under Section 564(b)(1) of the Act, 21  U.S.C. section 360bbb-3(b)(1), unless the authorization is  terminated or revoked. Performed at [facility], [place] [address], [place], [place] [address]   Blood culture (routine x 2)     Status: None (Preliminary result)   Collection Time: 10/01/19  4:31 AM   Specimen: BLOOD  Result Value Ref Range Status   Specimen Description BLOOD RIGHT HAND   Final   Special Requests   Final    BOTTLES DRAWN AEROBIC AND ANAEROBIC Blood Culture adequate volume Performed at [facility], [place] [address], [place], [place] [address]    Culture NO GROWTH 4 DAYS  Final   Report Status PENDING  Incomplete  Blood culture (routine x 2)     Status: None (Preliminary result)   Collection Time: 10/01/19  4:40 AM   Specimen: BLOOD  Result Value Ref Range Status   Specimen Description BLOOD RIGHT ARM  Final   Special Requests   Final    BOTTLES DRAWN AEROBIC AND ANAEROBIC Blood Culture adequate volume Performed at [facility], [place] [address] [place], [place] [address]    Culture NO GROWTH 4 DAYS  Final   Report Status PENDING  Incomplete  Culture, blood (routine x 2)     Status: None (Preliminary result)   Collection Time: 10/01/19  3:20 PM   Specimen: BLOOD RIGHT ARM  Result Value Ref Range Status   Specimen Description BLOOD RIGHT ARM  Final   Special Requests   Final    BOTTLES DRAWN AEROBIC ONLY Blood Culture results may not be optimal due to an inadequate volume of blood received in culture bottles Performed at [facility], [place] [address] [place],  [address]    Culture NO GROWTH 4 DAYS  Final   Report Status PENDING  Incomplete  Culture, blood (routine x 2)     Status: None (Preliminary result)   Collection Time: 10/01/19  3:20 PM   Specimen: BLOOD RIGHT ARM  Result Value Ref Range Status   Specimen Description BLOOD RIGHT ARM  Final   Special Requests   Final    BOTTLES DRAWN AEROBIC ONLY Blood Culture results may not be optimal due to an inadequate volume of blood received in culture bottles Performed at [facility], [place] [address] [place], [place] [address]    Culture NO GROWTH 4 DAYS  Final   Report Status PENDING  Incomplete  MRSA PCR Screening     Status: None   Collection Time: 10/01/19  6:37 PM   Specimen: Nasal Mucosa; Nasopharyngeal  Result Value Ref Range Status   MRSA by PCR NEGATIVE  NEGATIVE Final    Comment:        The GeneXpert MRSA Assay (FDA approved for NASAL specimens only), is one component of a comprehensive MRSA colonization surveillance program. It is not intended to diagnose MRSA infection nor to guide or monitor treatment for MRSA infections. Performed at [facility], [place] [address], [place], [place] [address]   Urine culture     Status: None   Collection Time: 10/01/19  7:12 PM   Specimen: Urine, Clean Catch  Result Value Ref Range Status   Specimen Description URINE, CLEAN CATCH  Final   Special Requests NONE  Final   Culture   Final    NO GROWTH Performed at [facility], [place] [address] [place], [place] [address]    Report Status 10/02/2019 FINAL  Final     Labs: Basic Metabolic Panel: Recent Labs  Lab 10/01/19 1129 10/01/19 1630 10/02/19 0314 10/03/19 0348 10/04/19 0405  NA 138 136 134* 132* 133*  K 6.7* 4.2 5.0 3.8 3.9  CL 94* 92* 92* 92* 94*  CO2 19* 21* 24 24 22   GLUCOSE 134* 99 96 102* 92  BUN 142* 75* 86* 47* 56*  CREATININE 34.27* 20.39* 24.30* 17.06* 18.79*  CALCIUM 7.7* 8.5* 7.8* 7.8* 8.0*  MG 2.5*  --   --  2.0 2.0  PHOS >30.0* 9.3* >30.0* 8.0* 8.4*   Liver Function Tests: Recent Labs  Lab 10/01/19 1129 10/01/19 1630 10/02/19 0314  AST 35  --   --   ALT 8  --   --   ALKPHOS 39  --   --   BILITOT 0.3  --   --   PROT 7.0  --   --   ALBUMIN 2.9* 3.3* 2.6*   No results for input(s): LIPASE, AMYLASE in the last 168 hours. No results for input(s): AMMONIA in the last 168 hours. CBC: Recent Labs  Lab 10/02/19 0314 10/02/19 0822 10/02/19 1543 10/03/19 0348 10/04/19 0405  WBC 12.3* 10.9* 11.3* 10.3 10.6*  HGB 6.0* 6.7* 8.2* 7.5* 8.5*  HCT 18.4* 20.3* 24.9* 22.6* 25.4*  MCV 88.9 89.8 90.2 89.3 90.1  PLT 229 197 216 211 278   Cardiac Enzymes: No results for input(s): CKTOTAL, CKMB, CKMBINDEX, TROPONINI in the last 168 hours. BNP: BNP (last 3 results) No results for input(s): BNP in the  last 8760 hours.  ProBNP (last 3 results) No results for input(s): PROBNP in the last 8760 hours.  CBG: Recent Labs  Lab 10/04/19 0711 10/04/19 1125 10/04/19 1757 10/04/19 2134 10/05/19 0700  GLUCAP 75 91 84 117* 87       Signed:  Domenic Polite MD.  [facility] 10/05/2019, 3:21 PM

## 2019-10-05 NOTE — Progress Notes (Signed)
Patient left AMA.  Dr. Broadus John aware.  RIJ, and both IVs were removed and Telemetry box taken off.  Patient walked off unit.  Wife waiting downstairs for pickup.

## 2019-10-05 NOTE — Plan of Care (Signed)
  Problem: Education: Goal: Knowledge of General Education information will improve Description: Including pain rating scale, medication(s)/side effects and non-pharmacologic comfort measures Outcome: Progressing   Problem: Activity: Goal: Risk for activity intolerance will decrease Outcome: Progressing   Problem: Nutrition: Goal: Adequate nutrition will be maintained Outcome: Progressing   Problem: Coping: Goal: Level of anxiety will decrease Outcome: Progressing   

## 2019-10-05 NOTE — Progress Notes (Signed)
[facility] Progress Note  Subjective: pt feeling well after HD yesterday.  No complaints just requesting temp HD catheter removal.  Long discussion, see below.   Vitals:   10/05/19 0028 10/05/19 0125 10/05/19 0534 10/05/19 0933  BP: (!) 159/100 (!) 155/95 (!) 144/90 (!) 156/108  Pulse:  (!) 105 (!) 105 100  Resp:    18  Temp:   99.8 F (37.7 C) 100 F (37.8 C)  TempSrc:   Oral Oral  SpO2:   95% 99%  Weight:        Exam: General: large framed AAM, no distress Lungs:  normal WOB Heart: tachy reg Abdomen: soft NT + BS M-S:  muscular Lower  no sig LE edema Neuro:NF< ox3, fluidly conversant Dialysis Access: right IJ temp cath - old ligated left AVF no bruit   Home meds:  - amlodipine 10/ carvedilol 3.125 bid  - lovastatin / oxycodone prn   CXR 1/31 sig improvement  Dialysis:  MWF [facility]    4.5h  F200  2/2 bath  105kg  425/800  Hep 10,000   - last HD tmt 1/13   - mircera 100 q 2 weeks- last dose 1/10 ,  hgb 10.3 1/13   - venofer 50/week   - calcitriol 4.5  - parsabiv 7.5  Assessment/Plan: 1. Acute hypoxic respiratory failure - secondary to Pulmonary edema/ poss PNA. CXR 1/31 improved. Resolved with HD 2. ESRD - Long discussions yesterday and today with patient.  At this time his plan is for Temp HD catheter to be removed, discharge, try 'holistic' therapy but if that doesn't work to return for dialysis care.  I reluctantly recommended leaving with a tunneled HD catheter for access as in my opinion it's certain he will return in need of dialysis, at least if he survives to return.  He refuses this plan.  I explained risks for permanent injury, death again today and he is willing to accept those.  He is making decision which are clearly against my medical advice.  I recommended he seek a second opinion if that will give him peace of mind.   He will plan to return to the ED for care when he elects to do so should he feel poorly.  3. HTN/volume  - normotensive today.   4. Anemia  - hgb low at 6.8, has had 2u prbc's; Hb yesterday 8.5 5. Metabolic bone disease -  binders on discharge if he will take 6. Goals of care - Per above.  Palliative care has also been heavily involved and has encouraged him to complete advanced directive and MOST form which I completely agree with and hope he does.  He has clearly stated he does not want to be DNR.      Jannifer Hick MD [facility]   Inpatient medications: . sodium chloride   Intravenous Once  . amLODipine  10 mg Oral Daily  . carvedilol  25 mg Oral BID WC  . chlorhexidine  60 mL Topical Once   And  . chlorhexidine  60 mL Topical Once  . chlorhexidine  15 mL Mouth Rinse BID  . Chlorhexidine Gluconate Cloth  6 each Topical Q0600  . heparin injection (subcutaneous)  5,000 Units Subcutaneous Q8H  . hydrALAZINE  50 mg Oral Q8H  . mouth rinse  15 mL Mouth Rinse q12n4p  . ramelteon  8 mg Oral QHS   . sodium chloride    . sodium chloride 10 mL/hr at 10/05/19 0539  .  ceFAZolin (ANCEF) IV    . cefTRIAXone (ROCEPHIN)  IV 2 g (10/05/19 0543)   acetaminophen, labetalol, nitroGLYCERIN Recent Labs  Lab 10/03/19 0348 10/04/19 0405  NA 132* 133*  K 3.8 3.9  CL 92* 94*  CO2 24 22  GLUCOSE 102* 92  BUN 47* 56*  CREATININE 17.06* 18.79*  CALCIUM 7.8* 8.0*  PHOS 8.0* 8.4*

## 2019-10-06 ENCOUNTER — Telehealth: Payer: Self-pay | Admitting: Internal Medicine

## 2019-10-06 ENCOUNTER — Telehealth: Payer: Self-pay

## 2019-10-06 LAB — CULTURE, BLOOD (ROUTINE X 2)
Culture: NO GROWTH
Culture: NO GROWTH
Culture: NO GROWTH
Culture: NO GROWTH
Special Requests: ADEQUATE
Special Requests: ADEQUATE

## 2019-10-06 NOTE — Telephone Encounter (Signed)
Pt is aware and Scheduled

## 2019-10-06 NOTE — Telephone Encounter (Signed)
Transition Care Management Follow-up Telephone Call  Date of discharge and from where: 10/05/2019, [facility]  - left AMA  How have you been since you were released from the hospital? He stated that he is " feeling fine."   Any questions or concerns? No questions or concerns reported.     Items Reviewed:  Did the pt receive and understand the discharge instructions provided? Did not receive - left AMA  Medications obtained and verified? No new medications ordered - left AMA.  He said that he has all of the medications that he was taking prior to his hospitalization and is taking those.    Any new allergies since your discharge? None reported   Do you have support at home? Yes, his wife  Other (ie: DME, Home Health, etc) no home health or DME ordered.   Functional Questionnaire: (I = Independent and D = Dependent) ADL's: indpendent   Follow up appointments reviewed:    PCP Hospital f/u appt confirmed? televisit -10/12/2019 @ 1610 with Dr [facility] f/u appt confirmed? No other appts scheduled at this time  Are transportation arrangements needed?no   If their condition worsens, is the pt aware to call  their PCP or go to the ED?yes  Was the patient provided with contact information for the PCP's office or ED? He has the phone number  Was the pt encouraged to call back with questions or concerns?yes

## 2019-10-06 NOTE — Telephone Encounter (Signed)
Patient called to request a Letter to be cleared for work he states he lost his benefits and would like the letter to state he is able to return to work on 10/06/2019. Pt says he would like the letter emailed to him when completed.Please follow up

## 2019-10-08 NOTE — Telephone Encounter (Signed)
Pt is rescheduled for Monday for in person visit

## 2019-10-11 ENCOUNTER — Encounter: Payer: Self-pay | Admitting: Internal Medicine

## 2019-10-11 ENCOUNTER — Ambulatory Visit: Payer: Medicare Other | Attending: Internal Medicine | Admitting: Internal Medicine

## 2019-10-11 ENCOUNTER — Other Ambulatory Visit: Payer: Self-pay

## 2019-10-11 VITALS — BP 172/105 | HR 91 | Temp 97.4°F | Resp 16 | Wt 235.2 lb

## 2019-10-11 DIAGNOSIS — Z79899 Other long term (current) drug therapy: Secondary | ICD-10-CM | POA: Diagnosis not present

## 2019-10-11 DIAGNOSIS — I129 Hypertensive chronic kidney disease with stage 1 through stage 4 chronic kidney disease, or unspecified chronic kidney disease: Secondary | ICD-10-CM | POA: Diagnosis not present

## 2019-10-11 DIAGNOSIS — Z9115 Patient's noncompliance with renal dialysis: Secondary | ICD-10-CM | POA: Diagnosis not present

## 2019-10-11 DIAGNOSIS — E669 Obesity, unspecified: Secondary | ICD-10-CM | POA: Diagnosis not present

## 2019-10-11 DIAGNOSIS — Z6831 Body mass index (BMI) 31.0-31.9, adult: Secondary | ICD-10-CM | POA: Diagnosis not present

## 2019-10-11 DIAGNOSIS — I120 Hypertensive chronic kidney disease with stage 5 chronic kidney disease or end stage renal disease: Secondary | ICD-10-CM | POA: Diagnosis not present

## 2019-10-11 DIAGNOSIS — D631 Anemia in chronic kidney disease: Secondary | ICD-10-CM | POA: Diagnosis not present

## 2019-10-11 DIAGNOSIS — Z992 Dependence on renal dialysis: Secondary | ICD-10-CM | POA: Diagnosis not present

## 2019-10-11 DIAGNOSIS — N186 End stage renal disease: Secondary | ICD-10-CM | POA: Diagnosis not present

## 2019-10-11 DIAGNOSIS — E1122 Type 2 diabetes mellitus with diabetic chronic kidney disease: Secondary | ICD-10-CM | POA: Insufficient documentation

## 2019-10-11 DIAGNOSIS — N189 Chronic kidney disease, unspecified: Secondary | ICD-10-CM

## 2019-10-11 MED FILL — LOVASTATIN 20 MG TABS: 20 | 60 days supply | Qty: 60 | Fill #2

## 2019-10-11 MED FILL — AMLODIPINE BESYLATE 10 MG T: 10 | 90 days supply | Qty: 90 | Fill #2

## 2019-10-11 NOTE — Patient Instructions (Signed)
Give appointment with Lurena Joiner in 1 week for recheck of blood pressure.

## 2019-10-11 NOTE — Progress Notes (Signed)
Patient ID: Patrick Ibarra, male    DOB: Jan 31, 1981  MRN: [id number]  CC: Hospitalization Follow-up and Letter for School/Work   Subjective: Patrick Ibarra is a 39 y.o. male who presents for transition of care for hospital follow-up.  His wife is with him His concerns today include:  ESRD, HTN, DM  Date of admission: 10/01/2019 Date of discharge 10/05/2019 Date of call with case worker 10/06/2019  Patient was hospitalized with acute hypoxic respiratory failure, hyperkalemia and metabolic acidosis after missing hemodialysis for a week.  He refused emergency hemodialysis at first but as his respiratory status declined he agreed to receive hemodialysis during this admission.  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  He refused HD catheter placement and AV fistula placement by vascular surgeon.  He wishes to try holistic alternative medicine approach.  He declined hospice and wishes to remain full code.  Today: Since discharge patient reports no shortness of breath or lower extremity edema.  Denies any PND orthopnea.  He has not been back to hemodialysis and does not plan to.  He states he is trying holistic approach by taking natural vitamins for kidney and inflammation.  He tells me he also takes iron and vitamin D.  He would like a note from me to be able to work.  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  They were notified in November that he will no longer be receiving disability check as of December 2020 because his wife made too much money for him to qualify for it any longer.  He states that things have been tight financially since then.  Blood pressure today is noted to be elevated.  He is on carvedilol and amlodipine.  He has not taken these medicines as yet for today.  His wife tells me that they just picked up the amlodipine. .  Patient  Active Problem List   Diagnosis Date Noted  . Palliative care by specialist   . Fatigue   . SOB (shortness of breath) 10/01/2019  . Noncompliance by refusing service 10/01/2019  . Arteriovenous fistula for hemodialysis in place, primary ([place]) 09/26/2019  . Skin ulcer of upper arm ([place]) 06/02/2019  . DNR (do not resuscitate) discussion 09/05/2017  . Diabetes mellitus type II, uncontrolled ([place]) 09/04/2017  . Mallory-Weiss tear 09/04/2017  . Reactive depression 09/04/2017  . Pancreatitis, acute 04/04/2017  . GI bleed 04/03/2017  . Anemia of chronic disease 04/03/2017  . Obesity 03/01/2017  . Anemia of chronic kidney failure 03/01/2017  . Essential hypertension 01/05/2014  . Diabetes mellitus ([place]) 12/24/2011  . ESRD (end stage renal disease) ([place]) 12/24/2011     Current Outpatient Medications on File Prior to Visit  Medication Sig Dispense Refill  . Accu-Chek FastClix Lancets MISC Use as instructed to check blood sugar once daily. E11.9 102 each 11  . amLODipine (NORVASC) 10 MG tablet Take 1 tablet (10 mg total) by mouth daily. 30 tablet 5  . Blood Glucose Monitoring Suppl (ACCU-CHEK GUIDE ME) w/Device KIT 1 kit by Does not apply route daily. Use as instructed to check blood sugar once daily. E11.9 1 kit 0  . carvedilol (COREG) 3.125 MG tablet TAKE 1 TABLET (3.125 MG TOTAL) BY MOUTH 2 (TWO) TIMES DAILY WITH A MEAL. 60 tablet 2  . carvedilol (COREG) 6.25 MG tablet Take 6.25 mg by mouth 2 (two) times daily  with a meal.    . glucose blood (ACCU-CHEK GUIDE) test strip Use as instructed to check blood sugar once daily. E11.9 100 each 12  . lovastatin (MEVACOR) 20 MG tablet Take 1 tablet (20 mg total) by mouth at bedtime. 30 tablet 3  . oxyCODONE-acetaminophen (PERCOCET/ROXICET) 5-325 MG tablet Take 1 tablet by mouth every 6 (six) hours as needed for moderate pain. 20 tablet 0   No current facility-administered medications on file prior to visit.    No Known Allergies  Social History    Socioeconomic History  . Marital status: Married    Spouse name: Not on file  . Number of children: Not on file  . Years of education: Not on file  . Highest education level: Not on file  Occupational History  . Not on file  Tobacco Use  . Smoking status: Never Smoker  . Smokeless tobacco: Never Used  Substance and Sexual Activity  . Alcohol use: Not Currently  . Drug use: No  . Sexual activity: Yes  Other Topics Concern  . Not on file  Social History Narrative   He is married with step kids. He works at [employer].    Social Determinants of Health   Financial Resource Strain:   . Difficulty of Paying Living Expenses: Not on file  Food Insecurity:   . Worried About Charity fundraiser in the Last Year: Not on file  . Ran Out of Food in the Last Year: Not on file  Transportation Needs:   . Lack of Transportation (Medical): Not on file  . Lack of Transportation (Non-Medical): Not on file  Physical Activity:   . Days of Exercise per Week: Not on file  . Minutes of Exercise per Session: Not on file  Stress:   . Feeling of Stress : Not on file  Social Connections:   . Frequency of Communication with Friends and Family: Not on file  . Frequency of Social Gatherings with Friends and Family: Not on file  . Attends Religious Services: Not on file  . Active Member of Clubs or Organizations: Not on file  . Attends Archivist Meetings: Not on file  . Marital Status: Not on file  Intimate Partner Violence:   . Fear of Current or Ex-Partner: Not on file  . Emotionally Abused: Not on file  . Physically Abused: Not on file  . Sexually Abused: Not on file    Family History  Problem Relation Age of Onset  . Hypertension Mother   . Diabetes Mother   . Hypertension Father     Past Surgical History:  Procedure Laterality Date  . AV FISTULA PLACEMENT Left 04/10/2017   Procedure: ARTERIOVENOUS (AV) FISTULA CREATION LEFT ARM;  Surgeon: Conrad Hart, MD;   Location: [place];  Service: Vascular;  Laterality: Left;  . ESOPHAGOGASTRODUODENOSCOPY (EGD) WITH PROPOFOL N/A 04/04/2017   Procedure: ESOPHAGOGASTRODUODENOSCOPY (EGD) WITH PROPOFOL;  Surgeon: Carol Ada, MD;  Location: [place];  Service: Endoscopy;  Laterality: N/A;  . FISTULA SUPERFICIALIZATION Left 06/22/2019   Procedure: 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 Primary repair.;  Surgeon: Angelia Mould, MD;  Location: [facility] OR;  Service: Vascular;  Laterality: Left;  . INSERTION OF DIALYSIS CATHETER Right 04/10/2017   Procedure: INSERTION OF DIALYSIS CATHETER - RIGHT INTERNAL JUGULAR PLACEMENT;  Surgeon: Conrad Livingston, MD;  Location: [place];  Service: Vascular;  Laterality: Right;  . THROMBECTOMY AND REVISION OF ARTERIOVENTOUS (AV) GORETEX  GRAFT Left 09/26/2019  Procedure: Ligation left arm AV fistula with excision of left arm AV fistula aneurysm;  Surgeon: Elam Dutch, MD;  Location[facility];  Service: Vascular;  Laterality: Left;  . TONSILLECTOMY AND ADENOIDECTOMY      ROS: Review of Systems Negative except as stated above  PHYSICAL EXAM: BP (!) 172/105   Pulse 91   Temp (!) 97.4 F (36.3 C)   Resp 16   Wt 235 lb 3.2 oz (106.7 kg)   SpO2 100%   BMI 31.03 kg/m   Wt Readings from Last 3 Encounters:  10/11/19 235 lb 3.2 oz (106.7 kg)  10/04/19 232 lb 12.9 oz (105.6 kg)  09/27/19 247 lb 6.4 oz (112.2 kg)    Physical Exam General appearance - alert, well appearing, and in no distress Mental status - normal mood, behavior, speech, dress, motor activity, and thought processes Neck - supple, no significant adenopathy Chest -breath sounds mildly decreased bilaterally  heart -regular rate rhythm.  No gallops Extremities -trace lower extremity edema CMP Latest Ref Rng & Units 10/04/2019 10/03/2019 10/02/2019  Glucose 70 - 99 mg/dL 92 102(H) 96  BUN 6 - 20 mg/dL 56(H) 47(H) 86(H)  Creatinine 0.61 - 1.24 mg/dL 18.79(H) 17.06(H) 24.30(H)  Sodium 135 - 145  mmol/L 133(L) 132(L) 134(L)  Potassium 3.5 - 5.1 mmol/L 3.9 3.8 5.0  Chloride 98 - 111 mmol/L 94(L) 92(L) 92(L)  CO2 22 - 32 mmol/L '22 24 24  ' Calcium 8.9 - 10.3 mg/dL 8.0(L) 7.8(L) 7.8(L)  Total Protein 6.5 - 8.1 g/dL - - -  Total Bilirubin 0.3 - 1.2 mg/dL - - -  Alkaline Phos 38 - 126 U/L - - -  AST 15 - 41 U/L - - -  ALT 0 - 44 U/L - - -   Lipid Panel     Component Value Date/Time   CHOL 232 (H) 06/10/2019 1214   TRIG 234 (H) 06/10/2019 1214   HDL 54 06/10/2019 1214   CHOLHDL 4.3 06/10/2019 1214   LDLCALC 136 (H) 06/10/2019 1214    CBC    Component Value Date/Time   WBC 10.6 (H) 10/04/2019 0405   RBC 2.82 (L) 10/04/2019 0405   HGB 8.5 (L) 10/04/2019 0405   HGB 9.6 (L) 06/10/2019 1214   HCT 25.4 (L) 10/04/2019 0405   HCT 27.8 (L) 06/10/2019 1214   PLT 278 10/04/2019 0405   PLT 227 06/10/2019 1214   MCV 90.1 10/04/2019 0405   MCV 91 06/10/2019 1214   MCH 30.1 10/04/2019 0405   MCHC 33.5 10/04/2019 0405   RDW 14.4 10/04/2019 0405   RDW 14.5 06/10/2019 1214   LYMPHSABS 1.0 12/21/2013 0510   MONOABS 0.1 12/21/2013 0510   EOSABS 0.0 12/21/2013 0510   BASOSABS 0.0 12/21/2013 0510    ASSESSMENT AND PLAN: 1. 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  I would not want him to put himself or his potential employer at wrists.  Would also like to touch base with his nephrologist Dr. Ditterding doing to get his opinion on the situation also.  Patient and his wife was not happy about this decision. He will follow up with me in 1 month  2. Hypertensive kidney disease Advised to take the carvedilol and amlodipine when he returns home today.  Follow-up with the clinical pharmacist in 1 week for repeat blood pressure check  3. Anemia of chronic renal  failure, unspecified CKD stage Continue iron supplement  Patient was given the opportunity to ask questions.  Patient verbalized understanding of the plan and was able to repeat key elements of the plan.   No orders of the defined types were placed in this encounter.    Requested Prescriptions    No prescriptions requested or ordered in this encounter    No follow-ups on file.  Karle Plumber, MD, FACP

## 2019-10-12 ENCOUNTER — Ambulatory Visit: Payer: Medicare Other | Admitting: Internal Medicine

## 2019-10-15 ENCOUNTER — Inpatient Hospital Stay: Payer: Medicare Other | Admitting: Internal Medicine

## 2019-10-25 ENCOUNTER — Telehealth: Payer: Self-pay

## 2019-10-25 NOTE — Telephone Encounter (Signed)
Pt called regarding a follow up ov from surgery.   Called patient x3 regarding an appt with no answer or return call.   York Cerise, CMA

## 2019-11-03 MED FILL — CARVEDILOL 6.25 MG TABLET: 6.25 | 30 days supply | Qty: 60 | Fill #1

## 2019-11-09 ENCOUNTER — Ambulatory Visit: Payer: Medicare Other | Attending: Internal Medicine | Admitting: Internal Medicine

## 2019-11-09 ENCOUNTER — Other Ambulatory Visit: Payer: Self-pay

## 2019-11-09 ENCOUNTER — Encounter: Payer: Self-pay | Admitting: Internal Medicine

## 2019-11-09 DIAGNOSIS — N186 End stage renal disease: Secondary | ICD-10-CM | POA: Diagnosis not present

## 2019-11-09 DIAGNOSIS — I129 Hypertensive chronic kidney disease with stage 1 through stage 4 chronic kidney disease, or unspecified chronic kidney disease: Secondary | ICD-10-CM | POA: Diagnosis not present

## 2019-11-09 MED ORDER — CARVEDILOL 12.5 MG PO TABS: 12.5000 mg | ORAL_TABLET | Freq: Two times a day (BID) | ORAL | 4 refills | Status: DC

## 2019-11-09 MED FILL — CARVEDILOL 12.5 MG TABLET: 12.5 | 30 days supply | Qty: 60 | Fill #0

## 2019-11-09 NOTE — Progress Notes (Signed)
Virtual Visit via Telephone Note Due to current restrictions/limitations of in-office visits due to the COVID-19 pandemic, this scheduled clinical appointment was converted to a telehealth visit  I connected with Patrick Ibarra on 11/09/19 at 10:08 a.m by telephone and verified that I am speaking with the correct person using two identifiers. I am in my office.  The patient is at home.  Only the patient and myself participated in this encounter.  I discussed the limitations, risks, security and privacy concerns of performing an evaluation and management service by telephone and the availability of in person appointments. I also discussed with the patient that there may be a patient responsible charge related to this service. The patient expressed understanding and agreed to proceed.   History of Present Illness: ESRD, HTN, DM.  Last seen 10/11/2019.    Since last visit, he reports compliance with Coreg and Amlodipine.  Reports BP range has been 150-160/100. -limits salt in foods -reports having a "little swelling" in the legs.  "I have had a little SOB but not as much as they told me I would have without having dialysis for a month or so."  He does not feel his breathing has diminished to the point where he can not work. No CP.  Occasional cough. -still wanting to go back to work.  Wants to return to the work he was doing prior to being dx with ESRD. 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  He was on disability but they were notified in November that he will no longer be receiving disability check as of December 2020 because his wife made too much money for him to qualify for it any longer.  Also received letter stating that they own SS $[id number].  He states that things have been tight financially since then.   Outpatient Encounter Medications as of 11/09/2019  Medication Sig  . Accu-Chek FastClix Lancets MISC Use as  instructed to check blood sugar once daily. E11.9  . amLODipine (NORVASC) 10 MG tablet Take 1 tablet (10 mg total) by mouth daily.  . Blood Glucose Monitoring Suppl (ACCU-CHEK GUIDE ME) w/Device KIT 1 kit by Does not apply route daily. Use as instructed to check blood sugar once daily. E11.9  . carvedilol (COREG) 6.25 MG tablet Take 6.25 mg by mouth 2 (two) times daily with a meal.  . glucose blood (ACCU-CHEK GUIDE) test strip Use as instructed to check blood sugar once daily. E11.9  . lovastatin (MEVACOR) 20 MG tablet Take 1 tablet (20 mg total) by mouth at bedtime.  . oxyCODONE-acetaminophen (PERCOCET/ROXICET) 5-325 MG tablet Take 1 tablet by mouth every 6 (six) hours as needed for moderate pain.   No facility-administered encounter medications on file as of 11/09/2019.      Observations/Objective: No direct observation  Assessment and Plan: 1. ESRD (end stage renal disease) (HCC) Advised patient that I am still cautious and think it is not wise for him to try to work given his end-stage renal disease and uncontrolled blood pressure.  To this and I am not comfortable writing a letter releasing him to work.  Encouraged him to reconsider getting back on dialysis but he is not wanting to do that at this time.  I did speak with the nephrologist Dr. Colodonato and he agrees with me. -I will send a message to our caseworker to see if legal aid is able to help   him reapply for disability - CBC - Comprehensive metabolic panel  2. Hypertensive kidney disease Not at goal.  Increase carvedilol to 12.5 mg twice a day - carvedilol (COREG) 12.5 MG tablet; Take 1 tablet (12.5 mg total) by mouth 2 (two) times daily with a meal.  Dispense: 60 tablet; Refill: 4   Follow Up Instructions: 1 mth in person   I discussed the assessment and treatment plan with the patient. The patient was provided an opportunity to ask questions and all were answered. The patient agreed with the plan and demonstrated an  understanding of the instructions.   The patient was advised to call back or seek an in-person evaluation if the symptoms worsen or if the condition fails to improve as anticipated.  I provided 19 minutes of non-face-to-face time during this encounter.   Deborah Johnson, MD   

## 2019-11-09 NOTE — Progress Notes (Deleted)
POST OPERATIVE OFFICE NOTE    CC:  F/u for surgery  HPI:  This is a 39 y.o. male who is s/p ligation of left arm fistula with excision of left arm AVF aneurysm on 09/26/2019 by Dr. Oneida Alar.   He was seen in the hospital by Dr. Donzetta Matters on 10/03/2019 and was in need of new dialysis access.  He did get vein mapping on 10/04/2019.  He did not get new access.  A palliative consult was obtained during that admission for goals of care as pt wanted to quit HD and take a holistic approach.   He presents today for follow up. He still has sutures in his left upper extremity in area of AVF aneurysm excision. He denies any steal symptoms. He is not having any numbness, pain, coldness in his left upper extremity.  The pt is not currently on dialysis. He has been trying "holistic" treatment since his discharge. He and his wife are still not open to HD and he is not interested in having a new fistula placed at this time  No Known Allergies  Current Outpatient Medications  Medication Sig Dispense Refill  . Accu-Chek FastClix Lancets MISC Use as instructed to check blood sugar once daily. E11.9 102 each 11  . amLODipine (NORVASC) 10 MG tablet Take 1 tablet (10 mg total) by mouth daily. 30 tablet 5  . Blood Glucose Monitoring Suppl (ACCU-CHEK GUIDE ME) w/Device KIT 1 kit by Does not apply route daily. Use as instructed to check blood sugar once daily. E11.9 1 kit 0  . carvedilol (COREG) 12.5 MG tablet Take 1 tablet (12.5 mg total) by mouth 2 (two) times daily with a meal. 60 tablet 4  . glucose blood (ACCU-CHEK GUIDE) test strip Use as instructed to check blood sugar once daily. E11.9 100 each 12  . lovastatin (MEVACOR) 20 MG tablet Take 1 tablet (20 mg total) by mouth at bedtime. 30 tablet 3  . oxyCODONE-acetaminophen (PERCOCET/ROXICET) 5-325 MG tablet Take 1 tablet by mouth every 6 (six) hours as needed for moderate pain. (Patient not taking: Reported on 11/10/2019) 20 tablet 0   No current facility-administered  medications for this visit.     ROS: Review of Systems  Constitutional: Positive for malaise/fatigue. Negative for chills and fever.  HENT: Positive for congestion. Negative for sore throat.   Respiratory: Positive for shortness of breath. Negative for cough.   Cardiovascular: Positive for orthopnea and leg swelling. Negative for chest pain, palpitations and claudication.  Gastrointestinal: Positive for nausea. Negative for abdominal pain, blood in stool and vomiting.       Intermittent diarrhea and constipation  Genitourinary: Negative for dysuria, flank pain and frequency.  Musculoskeletal: Negative for joint pain and myalgias.  Skin: Negative for rash.   Today's Vitals   11/10/19 0825  BP: (!) 149/92  Pulse: 97  Resp: 18  Temp: (!) 97.3 F (36.3 C)  TempSrc: Temporal  Weight: 248 lb (112.5 kg)  Height: '6\' 1"'  (1.854 m)   Body mass index is 32.72 kg/m.  Physical Exam: General: well nourished, not in any distress, does have some dyspnea when speaking Lungs: Clear to auscultation, no wheezing, non labored Heart: regular rate and rhythm Incision: left distal upper arm, healed, sutures removed Extremities:  There 2+ palpable radial and brachial pulse bilaterally.  Motor and sensory in tact.  There is no thrill/bruit present. Left upper extremity fistula is s/p ligation.  Aneurysmal area in distal upper arm. No tenderness. No swelling. Left arm and  hand warm  Vein mapping Duplex on 10/04/2019: +-----------------+-------------+----------+--------+  Right Cephalic  Diameter (cm)Depth (cm)Findings  +-----------------+-------------+----------+--------+  Shoulder       0.29     0.91        +-----------------+-------------+----------+--------+  Prox upper arm    0.30     0.87        +-----------------+-------------+----------+--------+  Mid upper arm    0.36     0.50        +-----------------+-------------+----------+--------+    Dist upper arm    0.45     0.33        +-----------------+-------------+----------+--------+  Antecubital fossa  0.36     0.33        +-----------------+-------------+----------+--------+  Prox forearm     0.40     0.46        +-----------------+-------------+----------+--------+  Mid forearm     0.36     0.50        +-----------------+-------------+----------+--------+  Dist forearm     0.31     0.37        +-----------------+-------------+----------+--------+  Wrist        0.30     0.32        +-----------------+-------------+----------+--------+   +-----------------+-------------+----------+----------------------------+  Right Basilic  Diameter (cm)Depth (cm)     Findings       +-----------------+-------------+----------+----------------------------+  Prox upper arm    0.65     1.67                  +-----------------+-------------+----------+----------------------------+  Mid upper arm    0.60     1.87                  +-----------------+-------------+----------+----------------------------+  Dist upper arm    0.52     0.73                  +-----------------+-------------+----------+----------------------------+  Antecubital fossa  0.47     0.65                  +-----------------+-------------+----------+----------------------------+  Prox forearm     0.33     0.53                  +-----------------+-------------+----------+----------------------------+  Mid forearm     0.26     0.67       branching       +-----------------+-------------+----------+----------------------------+  Distal forearm              branching and not visualized   +-----------------+-------------+----------+----------------------------+   +-----------------+-------------+----------+------------------+  Left Cephalic  Diameter (cm)Depth (cm)   Findings     +-----------------+-------------+----------+------------------+  Shoulder                 aneurysmal fistula  +-----------------+-------------+----------+------------------+  Prox upper arm              aneurysmal fistula  +-----------------+-------------+----------+------------------+  Mid upper arm              aneurysmal fistula  +-----------------+-------------+----------+------------------+  Dist upper arm              aneurysmal fistula  +-----------------+-------------+----------+------------------+  Antecubital fossa            aneurysmal fistula  +-----------------+-------------+----------+------------------+  Prox forearm     0.37     0.81             +-----------------+-------------+----------+------------------+  Mid forearm     0.29     0.51             +-----------------+-------------+----------+------------------+  Dist forearm     0.27     0.36             +-----------------+-------------+----------+------------------+  Wrist        0.19     0.30             +-----------------+-------------+----------+------------------+   +-----------------+-------------+----------+--------------+  Left Basilic   Diameter (cm)Depth (cm)  Findings    +-----------------+-------------+----------+--------------+  Shoulder                 not visualized  +-----------------+-------------+----------+--------------+  Prox upper arm              not visualized  +-----------------+-------------+----------+--------------+  Mid upper arm              not  visualized  +-----------------+-------------+----------+--------------+  Dist upper arm              not visualized  +-----------------+-------------+----------+--------------+  Antecubital fossa  0.65     0.69           +-----------------+-------------+----------+--------------+  Prox forearm     0.35     0.69           +-----------------+-------------+----------+--------------+  Mid forearm     0.25     0.41           +-----------------+-------------+----------+--------------+  Distal forearm    0.19     0.35           +-----------------+-------------+----------+--------------+   Assessment/Plan:  This is a 39 y.o. male who is s/p: Ligation left arm AV fistula with excision of left arm AV fistula aneurysm by Dr. Oneida Alar on 09/26/2019 who presents today for follow up. He has adequate veins in RUE for new access however patient is still proceeding with holistic options for his ESRD. He saw his PCP yesterday 11/09/19 and had labs ordered. I advised him to follow up with PCP regarding these lab results and referral to Nephrologist. He stated he does not have nephrologist but he does apparently see Dr. Arty Baumgartner. He has had multiple discussions with his PCP and other providers advising him to go back on dialysis but he is not wanting to at this time  -the pt does not have evidence of steal. -will need a new access placement when or if patient decides to get back on HD - Advised the patient to return for follow up for re-evaluation if his symptoms worsen and he wants to consider hemodialysis -the pt will follow up as needed   Paulo Fruit, PA-C [facility] [phone]  Clinic MD:  Scot Dock

## 2019-11-09 NOTE — Progress Notes (Signed)
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 Pt states the company is just needing a letter stating that he is able to drive

## 2019-11-10 ENCOUNTER — Telehealth: Payer: Self-pay

## 2019-11-10 ENCOUNTER — Other Ambulatory Visit: Payer: Self-pay

## 2019-11-10 ENCOUNTER — Ambulatory Visit (INDEPENDENT_AMBULATORY_CARE_PROVIDER_SITE_OTHER): Payer: Self-pay | Admitting: Physician Assistant

## 2019-11-10 VITALS — BP 149/92 | HR 97 | Temp 97.3°F | Resp 18 | Ht 73.0 in | Wt 248.0 lb

## 2019-11-10 DIAGNOSIS — N186 End stage renal disease: Secondary | ICD-10-CM

## 2019-11-10 NOTE — Telephone Encounter (Signed)
At request of Dr Wynetta Emery, call placed to patient # [phone] regarding re-applying for disability and the services that [employer] [facility] [place] may be able to provide.  Message left with call back requested to this CM.  Call placed to # [phone], patient's wife answered and she was not with the patient.  No DPR signed with her name on it.  She said that she would need to speak with him tonight and call back in the evening.  Explained to her that the clinic closes at 1730.

## 2019-11-10 NOTE — Telephone Encounter (Signed)
Call received from patient.  Explained the services that Legal Aid may be able to provide. He said that he had been approved for disability and received benefits for a year and then was denied.  He now owes money to [employer].  He would like to return to work to help pay bills but he said that Dr Wynetta Emery would not release him to return to work. He also said that he was told his wife makes too much money to qualify for disability.  He was in agreement to placing a referral to [employer] [facility] [place] and give this CM authorization to release medical information to them if needed.    Informed him that there is not a DPR authorizing [facility] staff to speak to his wife. This CM left a message with her earlier to have him call this CM.  This CM could not release any information to her.    Referral faxed to [employer] [facility] [place].

## 2019-11-11 ENCOUNTER — Telehealth: Payer: Self-pay | Admitting: Internal Medicine

## 2019-11-11 NOTE — Telephone Encounter (Signed)
Will forward to pcp

## 2019-11-11 NOTE — Telephone Encounter (Signed)
Patient called and requested for a refill on his insulin. Patient stated that he doesn't take it everyday he just uses it when it runs high. Patient stated that his blood sugars have been running high the past few times he has checked it. Please follow up at your earliest convenience.   insulin aspart (novoLOG) injection 10 Units  [[id number]]  [facility] pharmacy

## 2019-11-12 NOTE — Telephone Encounter (Signed)
Contacted pt to go over Dr. Wynetta Emery response. Pt states he checks his sugars everyday. Pt states he checks his sugar before and after meals. Pt states his sugar ranges from the 130's-200's. Pt states his sugar normally doesn't range that high. Pt states he hasn't taken insulin since 2015

## 2019-11-14 MED ORDER — NOVOLOG FLEXPEN 100 UNIT/ML ~~LOC~~ SOPN: PEN_INJECTOR | SUBCUTANEOUS | 2 refills | Status: DC

## 2019-11-14 MED ORDER — PEN NEEDLES 31G X 8 MM MISC: 6 refills | Status: DC

## 2019-11-15 MED FILL — NOVOLOG FLEXPEN SYRINGE: 100 | 50 days supply | Qty: 3 | Fill #0

## 2019-11-15 MED FILL — TRUEPLUS PEN NDL 31GX5/16: 31G X 8 MM | 30 days supply | Qty: 100 | Fill #0

## 2019-11-15 NOTE — Telephone Encounter (Signed)
Contacted pt to go over Dr. Wynetta Emery response pt didn't answer left pt a detailed vm and if he has any questions or concerns to give me a call

## 2019-11-16 ENCOUNTER — Inpatient Hospital Stay (HOSPITAL_COMMUNITY)
Admission: EM | Admit: 2019-11-16 | Discharge: 2019-11-20 | DRG: 177 | Disposition: A | Discharge status: Home / Self Care | Payer: Medicare Other | Attending: Internal Medicine | Admitting: Internal Medicine

## 2019-11-16 ENCOUNTER — Emergency Department (HOSPITAL_COMMUNITY): Payer: Medicare Other

## 2019-11-16 ENCOUNTER — Encounter (HOSPITAL_COMMUNITY): Payer: Self-pay | Admitting: Emergency Medicine

## 2019-11-16 ENCOUNTER — Other Ambulatory Visit: Payer: Self-pay

## 2019-11-16 DIAGNOSIS — J1282 Pneumonia due to coronavirus disease 2019: Secondary | ICD-10-CM | POA: Diagnosis present

## 2019-11-16 DIAGNOSIS — Z992 Dependence on renal dialysis: Secondary | ICD-10-CM | POA: Diagnosis not present

## 2019-11-16 DIAGNOSIS — D631 Anemia in chronic kidney disease: Secondary | ICD-10-CM | POA: Diagnosis present

## 2019-11-16 DIAGNOSIS — R001 Bradycardia, unspecified: Secondary | ICD-10-CM | POA: Diagnosis not present

## 2019-11-16 DIAGNOSIS — I10 Essential (primary) hypertension: Secondary | ICD-10-CM | POA: Diagnosis not present

## 2019-11-16 DIAGNOSIS — Z79891 Long term (current) use of opiate analgesic: Secondary | ICD-10-CM

## 2019-11-16 DIAGNOSIS — U071 COVID-19: Secondary | ICD-10-CM | POA: Diagnosis not present

## 2019-11-16 DIAGNOSIS — J811 Chronic pulmonary edema: Secondary | ICD-10-CM | POA: Diagnosis present

## 2019-11-16 DIAGNOSIS — Z8249 Family history of ischemic heart disease and other diseases of the circulatory system: Secondary | ICD-10-CM | POA: Diagnosis not present

## 2019-11-16 DIAGNOSIS — N189 Chronic kidney disease, unspecified: Secondary | ICD-10-CM | POA: Diagnosis not present

## 2019-11-16 DIAGNOSIS — Z515 Encounter for palliative care: Secondary | ICD-10-CM | POA: Diagnosis not present

## 2019-11-16 DIAGNOSIS — E875 Hyperkalemia: Secondary | ICD-10-CM

## 2019-11-16 DIAGNOSIS — N179 Acute kidney failure, unspecified: Secondary | ICD-10-CM | POA: Diagnosis not present

## 2019-11-16 DIAGNOSIS — I120 Hypertensive chronic kidney disease with stage 5 chronic kidney disease or end stage renal disease: Secondary | ICD-10-CM | POA: Diagnosis present

## 2019-11-16 DIAGNOSIS — R4020 Unspecified coma: Secondary | ICD-10-CM | POA: Diagnosis not present

## 2019-11-16 DIAGNOSIS — N186 End stage renal disease: Secondary | ICD-10-CM | POA: Diagnosis present

## 2019-11-16 DIAGNOSIS — Z79899 Other long term (current) drug therapy: Secondary | ICD-10-CM | POA: Diagnosis not present

## 2019-11-16 DIAGNOSIS — R410 Disorientation, unspecified: Secondary | ICD-10-CM | POA: Diagnosis not present

## 2019-11-16 DIAGNOSIS — E1129 Type 2 diabetes mellitus with other diabetic kidney complication: Secondary | ICD-10-CM | POA: Diagnosis not present

## 2019-11-16 DIAGNOSIS — N19 Unspecified kidney failure: Secondary | ICD-10-CM | POA: Diagnosis present

## 2019-11-16 DIAGNOSIS — Z7189 Other specified counseling: Secondary | ICD-10-CM | POA: Diagnosis not present

## 2019-11-16 DIAGNOSIS — E872 Acidosis: Secondary | ICD-10-CM | POA: Diagnosis present

## 2019-11-16 DIAGNOSIS — J9601 Acute respiratory failure with hypoxia: Secondary | ICD-10-CM | POA: Diagnosis present

## 2019-11-16 DIAGNOSIS — Z833 Family history of diabetes mellitus: Secondary | ICD-10-CM

## 2019-11-16 DIAGNOSIS — R404 Transient alteration of awareness: Secondary | ICD-10-CM | POA: Diagnosis not present

## 2019-11-16 DIAGNOSIS — Z4682 Encounter for fitting and adjustment of non-vascular catheter: Secondary | ICD-10-CM | POA: Diagnosis not present

## 2019-11-16 DIAGNOSIS — R0689 Other abnormalities of breathing: Secondary | ICD-10-CM | POA: Diagnosis not present

## 2019-11-16 DIAGNOSIS — N185 Chronic kidney disease, stage 5: Secondary | ICD-10-CM

## 2019-11-16 DIAGNOSIS — R0902 Hypoxemia: Secondary | ICD-10-CM | POA: Diagnosis not present

## 2019-11-16 DIAGNOSIS — E1122 Type 2 diabetes mellitus with diabetic chronic kidney disease: Secondary | ICD-10-CM | POA: Diagnosis not present

## 2019-11-16 DIAGNOSIS — E8770 Fluid overload, unspecified: Secondary | ICD-10-CM | POA: Diagnosis present

## 2019-11-16 LAB — GLUCOSE, CAPILLARY
Glucose-Capillary: 72 mg/dL (ref 70–99)
Glucose-Capillary: 73 mg/dL (ref 70–99)
Glucose-Capillary: 57 mg/dL — ABNORMAL LOW (ref 70–99)
Glucose-Capillary: 91 mg/dL (ref 70–99)
Glucose-Capillary: 39 mg/dL — CL (ref 70–99)
Glucose-Capillary: 120 mg/dL — ABNORMAL HIGH (ref 70–99)
Glucose-Capillary: 18 mg/dL — CL (ref 70–99)
Glucose-Capillary: 103 mg/dL — ABNORMAL HIGH (ref 70–99)
Glucose-Capillary: 103 mg/dL — ABNORMAL HIGH (ref 70–99)
Glucose-Capillary: 86 mg/dL (ref 70–99)

## 2019-11-16 LAB — BASIC METABOLIC PANEL
Anion gap: 30 — ABNORMAL HIGH (ref 5–15)
Anion gap: 26 — ABNORMAL HIGH (ref 5–15)
BUN: 160 mg/dL — ABNORMAL HIGH (ref 6–20)
BUN: 123 mg/dL — ABNORMAL HIGH (ref 6–20)
CO2: 14 mmol/L — ABNORMAL LOW (ref 22–32)
CO2: 20 mmol/L — ABNORMAL LOW (ref 22–32)
Calcium: 7.8 mg/dL — ABNORMAL LOW (ref 8.9–10.3)
Calcium: 8.7 mg/dL — ABNORMAL LOW (ref 8.9–10.3)
Chloride: 97 mmol/L — ABNORMAL LOW (ref 98–111)
Chloride: 92 mmol/L — ABNORMAL LOW (ref 98–111)
Creatinine, Ser: 44.67 mg/dL — ABNORMAL HIGH (ref 0.61–1.24)
Creatinine, Ser: 36.73 mg/dL — ABNORMAL HIGH (ref 0.61–1.24)
GFR calc Af Amer: 1 mL/min — ABNORMAL LOW (ref >60)
GFR calc Af Amer: 1 mL/min — ABNORMAL LOW (ref >60)
GFR calc non Af Amer: 1 mL/min — ABNORMAL LOW (ref >60)
GFR calc non Af Amer: 1 mL/min — ABNORMAL LOW (ref >60)
Glucose, Bld: 190 mg/dL — ABNORMAL HIGH (ref 70–99)
Glucose, Bld: 128 mg/dL — ABNORMAL HIGH (ref 70–99)
Potassium: >7.5 mmol/L (ref 3.5–5.1)
Potassium: 5.9 mmol/L — ABNORMAL HIGH (ref 3.5–5.1)
Sodium: 141 mmol/L (ref 135–145)
Sodium: 138 mmol/L (ref 135–145)

## 2019-11-16 LAB — CBC WITH DIFFERENTIAL/PLATELET
Abs Immature Granulocytes: 0.03 10*3/uL (ref 0.00–0.07)
Basophils Absolute: 0 10*3/uL (ref 0.0–0.1)
Basophils Relative: 0 %
Eosinophils Absolute: 0 10*3/uL (ref 0.0–0.5)
Eosinophils Relative: 1 %
HCT: 15.9 % — ABNORMAL LOW (ref 39.0–52.0)
Hemoglobin: 4.8 g/dL — CL (ref 13.0–17.0)
Immature Granulocytes: 1 %
Lymphocytes Relative: 15 %
Lymphs Abs: 0.7 10*3/uL (ref 0.7–4.0)
MCH: 28.6 pg (ref 26.0–34.0)
MCHC: 30.2 g/dL (ref 30.0–36.0)
MCV: 94.6 fL (ref 80.0–100.0)
Monocytes Absolute: 0.3 10*3/uL (ref 0.1–1.0)
Monocytes Relative: 7 %
Neutro Abs: 3.5 10*3/uL (ref 1.7–7.7)
Neutrophils Relative %: 76 %
Platelets: 125 10*3/uL — ABNORMAL LOW (ref 150–400)
RBC: 1.68 MIL/uL — ABNORMAL LOW (ref 4.22–5.81)
RDW: 18.4 % — ABNORMAL HIGH (ref 11.5–15.5)
WBC: 4.6 10*3/uL (ref 4.0–10.5)
nRBC: 0.4 % — ABNORMAL HIGH (ref 0.0–0.2)

## 2019-11-16 LAB — COMPREHENSIVE METABOLIC PANEL
ALT: 303 U/L — ABNORMAL HIGH (ref 0–44)
AST: 403 U/L — ABNORMAL HIGH (ref 15–41)
Albumin: 2.8 g/dL — ABNORMAL LOW (ref 3.5–5.0)
Alkaline Phosphatase: 83 U/L (ref 38–126)
Anion gap: 31 — ABNORMAL HIGH (ref 5–15)
BUN: 190 mg/dL — ABNORMAL HIGH (ref 6–20)
CO2: 20 mmol/L — ABNORMAL LOW (ref 22–32)
Calcium: 9 mg/dL (ref 8.9–10.3)
Chloride: 89 mmol/L — ABNORMAL LOW (ref 98–111)
Creatinine, Ser: >50 mg/dL — ABNORMAL HIGH (ref 0.61–1.24)
Glucose, Bld: 81 mg/dL (ref 70–99)
Potassium: >7.5 mmol/L (ref 3.5–5.1)
Sodium: 140 mmol/L (ref 135–145)
Total Bilirubin: 2.5 mg/dL — ABNORMAL HIGH (ref 0.3–1.2)
Total Protein: 6.9 g/dL (ref 6.5–8.1)

## 2019-11-16 LAB — PHOSPHORUS: Phosphorus: 19.6 mg/dL — ABNORMAL HIGH (ref 2.5–4.6)

## 2019-11-16 LAB — CBC
HCT: 20 % — ABNORMAL LOW (ref 39.0–52.0)
Hemoglobin: 6.1 g/dL — CL (ref 13.0–17.0)
MCH: 28.6 pg (ref 26.0–34.0)
MCHC: 30.5 g/dL (ref 30.0–36.0)
MCV: 93.9 fL (ref 80.0–100.0)
Platelets: 137 10*3/uL — ABNORMAL LOW (ref 150–400)
RBC: 2.13 MIL/uL — ABNORMAL LOW (ref 4.22–5.81)
RDW: 17.8 % — ABNORMAL HIGH (ref 11.5–15.5)
WBC: 8 10*3/uL (ref 4.0–10.5)
nRBC: 0 % (ref 0.0–0.2)

## 2019-11-16 LAB — RESPIRATORY PANEL BY RT PCR (FLU A&B, COVID)
Influenza A by PCR: NEGATIVE
Influenza B by PCR: NEGATIVE
SARS Coronavirus 2 by RT PCR: POSITIVE — AB

## 2019-11-16 LAB — MAGNESIUM: Magnesium: 4.1 mg/dL — ABNORMAL HIGH (ref 1.7–2.4)

## 2019-11-16 LAB — PREPARE RBC (CROSSMATCH)

## 2019-11-16 LAB — MRSA PCR SCREENING: MRSA by PCR: NEGATIVE

## 2019-11-16 LAB — FIBRINOGEN: Fibrinogen: >800 mg/dL — ABNORMAL HIGH (ref 210–475)

## 2019-11-16 MED ORDER — DEXTROSE 50 % IV SOLN
INTRAVENOUS | Status: AC
  Filled 2019-11-16: qty 50

## 2019-11-16 MED ORDER — DEXAMETHASONE SODIUM PHOSPHATE 10 MG/ML IJ SOLN
6.0000 mg | Freq: Every day | INTRAMUSCULAR | Status: DC
  Administered 2019-11-17 – 2019-11-19 (×3): 6 mg via INTRAVENOUS
  Filled 2019-11-16 (×3): qty 1

## 2019-11-16 MED ORDER — SODIUM CHLORIDE 0.9 % IV SOLN
100.0000 mg | Freq: Every day | INTRAVENOUS | Status: AC
  Administered 2019-11-17 – 2019-11-20 (×4): 100 mg via INTRAVENOUS
  Filled 2019-11-16 (×4): qty 20

## 2019-11-16 MED ORDER — SODIUM CHLORIDE 0.9 % IV SOLN
100.0000 mg | INTRAVENOUS | Status: AC
  Administered 2019-11-16 (×2): 100 mg via INTRAVENOUS
  Filled 2019-11-16 (×5): qty 20

## 2019-11-16 MED ORDER — ACETAMINOPHEN 325 MG PO TABS
650.0000 mg | ORAL_TABLET | ORAL | Status: DC | PRN
  Administered 2019-11-16: 650 mg via ORAL
  Filled 2019-11-16: qty 2

## 2019-11-16 MED ORDER — SODIUM BICARBONATE 8.4 % IV SOLN
50.0000 meq | Freq: Once | INTRAVENOUS | Status: AC
  Administered 2019-11-16: 50 meq via INTRAVENOUS

## 2019-11-16 MED ORDER — DARBEPOETIN ALFA 150 MCG/0.3ML IJ SOSY: 150.0000 ug | PREFILLED_SYRINGE | INTRAMUSCULAR | Status: DC

## 2019-11-16 MED ORDER — AMLODIPINE BESYLATE 10 MG PO TABS
10.0000 mg | ORAL_TABLET | Freq: Every day | ORAL | Status: DC
  Administered 2019-11-16 – 2019-11-20 (×5): 10 mg via ORAL
  Filled 2019-11-16 (×6): qty 1

## 2019-11-16 MED ORDER — SODIUM ZIRCONIUM CYCLOSILICATE 10 G PO PACK
10.0000 g | PACK | ORAL | Status: AC
  Administered 2019-11-16: 10 g via ORAL
  Filled 2019-11-16: qty 1

## 2019-11-16 MED ORDER — SODIUM CHLORIDE 0.9 % IV SOLN
20.0000 ug | Freq: Once | INTRAVENOUS | Status: AC
  Administered 2019-11-16: 20 ug via INTRAVENOUS
  Filled 2019-11-16: qty 5

## 2019-11-16 MED ORDER — ALBUTEROL SULFATE (2.5 MG/3ML) 0.083% IN NEBU: 10.0000 mg | INHALATION_SOLUTION | Freq: Once | RESPIRATORY_TRACT | Status: DC

## 2019-11-16 MED ORDER — CARVEDILOL 12.5 MG PO TABS
12.5000 mg | ORAL_TABLET | Freq: Two times a day (BID) | ORAL | Status: DC
  Administered 2019-11-16 – 2019-11-20 (×8): 12.5 mg via ORAL
  Filled 2019-11-16 (×9): qty 1

## 2019-11-16 MED ORDER — CALCIUM GLUCONATE-NACL 2-0.675 GM/100ML-% IV SOLN
2.0000 g | Freq: Once | INTRAVENOUS | Status: AC
  Administered 2019-11-16: 2000 mg via INTRAVENOUS
  Filled 2019-11-16: qty 100

## 2019-11-16 MED ORDER — DEXTROSE 50 % IV SOLN
12.5000 g | INTRAVENOUS | Status: AC
  Administered 2019-11-16: 12.5 g via INTRAVENOUS

## 2019-11-16 MED ORDER — SODIUM POLYSTYRENE SULFONATE 15 GM/60ML PO SUSP: 30.0000 g | Freq: Once | ORAL | Status: DC

## 2019-11-16 MED ORDER — DEXTROSE 250 MG/ML IV SOLN
25.0000 g | Freq: Once | INTRAVENOUS | Status: DC
  Filled 2019-11-16 (×2): qty 100

## 2019-11-16 MED ORDER — HYDRALAZINE HCL 10 MG PO TABS
10.0000 mg | ORAL_TABLET | Freq: Four times a day (QID) | ORAL | Status: DC | PRN
  Administered 2019-11-17: 10 mg via ORAL
  Filled 2019-11-16: qty 1

## 2019-11-16 MED ORDER — DEXTROSE 50 % IV SOLN
1.0000 | Freq: Once | INTRAVENOUS | Status: AC
  Administered 2019-11-16: 50 mL via INTRAVENOUS
  Filled 2019-11-16: qty 50

## 2019-11-16 MED ORDER — INSULIN ASPART 100 UNIT/ML ~~LOC~~ SOLN: 5.0000 [IU] | Freq: Once | SUBCUTANEOUS | Status: DC

## 2019-11-16 MED ORDER — INSULIN ASPART 100 UNIT/ML ~~LOC~~ SOLN
5.0000 [IU] | Freq: Once | SUBCUTANEOUS | Status: AC
  Administered 2019-11-16: 5 [IU] via INTRAVENOUS

## 2019-11-16 MED ORDER — AMLODIPINE BESYLATE 5 MG PO TABS: 5.0000 mg | ORAL_TABLET | Freq: Every day | ORAL | Status: DC

## 2019-11-16 MED ORDER — DEXTROSE 10 % IV SOLN: INTRAVENOUS | Status: DC

## 2019-11-16 MED ORDER — SODIUM CHLORIDE 0.9 % IV SOLN: 200.0000 mg | Freq: Once | INTRAVENOUS | Status: DC

## 2019-11-16 MED ORDER — DARBEPOETIN ALFA 150 MCG/0.3ML IJ SOSY
150.0000 ug | PREFILLED_SYRINGE | Freq: Once | INTRAMUSCULAR | Status: AC
  Administered 2019-11-16: 150 ug via SUBCUTANEOUS
  Filled 2019-11-16: qty 0.3

## 2019-11-16 MED ORDER — SODIUM CHLORIDE 0.9 % IV SOLN: 1.0000 g | Freq: Once | INTRAVENOUS | Status: DC

## 2019-11-16 MED ORDER — CALCIUM CHLORIDE 10 % IV SOLN
1.0000 g | Freq: Once | INTRAVENOUS | Status: AC
  Administered 2019-11-16: 1 g via INTRAVENOUS

## 2019-11-16 MED ORDER — DEXTROSE 50 % IV SOLN
25.0000 g | INTRAVENOUS | Status: AC
  Administered 2019-11-16: 25 g via INTRAVENOUS

## 2019-11-16 MED ORDER — SODIUM CHLORIDE 0.9 % IV SOLN: 10.0000 mL/h | Freq: Once | INTRAVENOUS | Status: DC

## 2019-11-16 MED ORDER — DEXTROSE 50 % IV SOLN
1.0000 | Freq: Once | INTRAVENOUS | Status: AC
  Administered 2019-11-16: 50 mL via INTRAVENOUS

## 2019-11-16 MED ORDER — "THROMBI-PAD 3""X3"" EX PADS"
1.0000 | MEDICATED_PAD | Freq: Once | CUTANEOUS | Status: DC
  Filled 2019-11-16: qty 1

## 2019-11-16 MED ORDER — INSULIN ASPART 100 UNIT/ML ~~LOC~~ SOLN
3.0000 [IU] | SUBCUTANEOUS | Status: DC
  Administered 2019-11-17: 3 [IU] via SUBCUTANEOUS
  Administered 2019-11-17: 9 [IU] via SUBCUTANEOUS
  Administered 2019-11-18 (×2): 3 [IU] via SUBCUTANEOUS

## 2019-11-16 MED ORDER — INSULIN ASPART 100 UNIT/ML ~~LOC~~ SOLN
10.0000 [IU] | Freq: Once | SUBCUTANEOUS | Status: AC
  Administered 2019-11-16: 10 [IU] via INTRAVENOUS

## 2019-11-16 MED ORDER — LABETALOL HCL 5 MG/ML IV SOLN: 10.0000 mg | INTRAVENOUS | Status: DC | PRN

## 2019-11-16 MED ORDER — SODIUM CHLORIDE 0.9 % IV SOLN: 100.0000 mg | Freq: Every day | INTRAVENOUS | Status: DC

## 2019-11-16 MED ORDER — ONDANSETRON HCL 4 MG/2ML IJ SOLN
4.0000 mg | Freq: Four times a day (QID) | INTRAMUSCULAR | Status: DC | PRN
  Administered 2019-11-16: 4 mg via INTRAVENOUS
  Filled 2019-11-16: qty 2

## 2019-11-16 MED ORDER — CHLORHEXIDINE GLUCONATE CLOTH 2 % EX PADS
6.0000 | MEDICATED_PAD | Freq: Every day | CUTANEOUS | Status: DC
  Administered 2019-11-17 – 2019-11-20 (×4): 6 via TOPICAL

## 2019-11-16 MED ORDER — DARBEPOETIN ALFA 150 MCG/0.3ML IJ SOSY
150.0000 ug | PREFILLED_SYRINGE | INTRAMUSCULAR | Status: DC
  Filled 2019-11-16 (×2): qty 0.3

## 2019-11-16 MED ORDER — ENOXAPARIN SODIUM 30 MG/0.3ML ~~LOC~~ SOLN: 30.0000 mg | Freq: Every day | SUBCUTANEOUS | Status: DC

## 2019-11-16 MED ORDER — HEPARIN SODIUM (PORCINE) 1000 UNIT/ML IJ SOLN
4000.0000 [IU] | Freq: Once | INTRAMUSCULAR | Status: AC
  Administered 2019-11-16: 4000 [IU] via INTRAVENOUS
  Filled 2019-11-16: qty 4

## 2019-11-16 NOTE — Progress Notes (Signed)
Blood glucose 18 after oral hypoglycemia interventions, checked twice. Patient received 1 amp D50. Spoke with Dr. Lucile Shutters. Will start D10 infusion at 14mL/hr, check blood glucose q1hr x4 then q2hr x2.

## 2019-11-16 NOTE — Progress Notes (Addendum)
eLink Physician-Brief Progress Note Patient Name: Patrick Ibarra DOB: 12-22-1980 MRN: [id number]   Date of Service  11/16/2019  HPI/Events of Note  Repeat K+ is 5.9, Pt with desaturations into the 70's followed by recovery when he is asleep, simulating OSA.  eICU Interventions  Night time CPAP of 8 cm H20 ordered, mild hyperkalemia treatment orders entered.        Kerry Kass Ogan 11/16/2019, 11:31 PM

## 2019-11-16 NOTE — ED Notes (Signed)
Pts wife Darcus Pester [phone]

## 2019-11-16 NOTE — Progress Notes (Signed)
Pt rectal temperature taken upon arrival from ED, temp 93.4 F. Bear hugger applied at this time, will continue to monitor

## 2019-11-16 NOTE — ED Triage Notes (Addendum)
Pt transported from home by EMS after becoming altered last evening. Pt elective to not return to HD after loss of graft in L arm. Pt being paced by EMS, #20 R hand, 1gm calcium gluconate, Bicarb 1 amp given. Pt talking on arrival, answering question appropriately. Last HD over 1 mo ago

## 2019-11-16 NOTE — Consult Note (Signed)
Patrick Ibarra Admit Date: 11/16/2019 11/16/2019 Patrick Ibarra Requesting Physician:  Patrick Holiday MD  Reason for Consult:  ESRD, Hyperkalemia HPI:  10M ESRD who chose to stop HD about a month ago as he thought it was not necessary, and with recent of AVF after ligration 09/26/19 and no current vascular access who called EMS earlier today feeling weak and with AMS.  EMS identified wide QRS and suspected hyperkalemia, he has received Calcium, insulin/dextrose, NaHCO3, lokelma.  K resuled > 7.5, HCO3 14, BUN 160, ,SCr 44.7.  Further he is COVID+ and with Hb 4.8.  EKS with QRS widened, junctional rhythm.  BPs up.  Stable on RA  Wife is at bedside, both she and the patient agree for HD emergently.    PMH Incudes:  HTN  DM2   Balance of 12 systems is negative w/ exceptions as above  PMH  Past Medical History:  Diagnosis Date  . Chronic kidney disease    dialysis M-W-F  . Diabetes mellitus without complication (HCC)    type 2, diet controlled  . Hypertension   . LV dysfunction   . Obesity    PSH  Past Surgical History:  Procedure Laterality Date  . AV FISTULA PLACEMENT Left 04/10/2017   Procedure: ARTERIOVENOUS (AV) FISTULA CREATION LEFT ARM;  Surgeon: Conrad Hosmer, MD;  Location: [facility];  Service: Vascular;  Laterality: Left;  . ESOPHAGOGASTRODUODENOSCOPY (EGD) WITH PROPOFOL N/A 04/04/2017   Procedure: ESOPHAGOGASTRODUODENOSCOPY (EGD) WITH PROPOFOL;  Surgeon: Carol Ada, MD;  Location: [facility];  Service: Endoscopy;  Laterality: N/A;  . FISTULA SUPERFICIALIZATION Left 06/22/2019   Procedure: 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 Primary repair.;  Surgeon: Angelia Mould, MD;  Location: [facility] OR;  Service: Vascular;  Laterality: Left;  . INSERTION OF DIALYSIS CATHETER Right 04/10/2017   Procedure: INSERTION OF DIALYSIS CATHETER - RIGHT INTERNAL JUGULAR PLACEMENT;  Surgeon: Conrad Limestone, MD;  Location: [facility];  Service: Vascular;  Laterality: Right;  .  THROMBECTOMY AND REVISION OF ARTERIOVENTOUS (AV) GORETEX  GRAFT Left 09/26/2019   Procedure: Ligation left arm AV fistula with excision of left arm AV fistula aneurysm;  Surgeon: Elam Dutch, MD;  Location: [facility] OR;  Service: Vascular;  Laterality: Left;  . TONSILLECTOMY AND ADENOIDECTOMY     FH  Family History  Problem Relation Age of Onset  . Hypertension Mother   . Diabetes Mother   . Hypertension Father    SH  reports that he has never smoked. He has never used smokeless tobacco. He reports previous alcohol use. He reports that he does not use drugs. Allergies No Known Allergies Home medications Prior to Admission medications   Medication Sig Start Date End Date Taking? Authorizing Provider  Accu-Chek FastClix Lancets MISC Use as instructed to check blood sugar once daily. E11.9 06/17/19   Ladell Pier, MD  amLODipine (NORVASC) 10 MG tablet Take 1 tablet (10 mg total) by mouth daily. 06/10/19   Ladell Pier, MD  Blood Glucose Monitoring Suppl (ACCU-CHEK GUIDE ME) w/Device KIT 1 kit by Does not apply route daily. Use as instructed to check blood sugar once daily. E11.9 06/17/19   Ladell Pier, MD  carvedilol (COREG) 12.5 MG tablet Take 1 tablet (12.5 mg total) by mouth 2 (two) times daily with a meal. 11/09/19   Ladell Pier, MD  glucose blood (ACCU-CHEK GUIDE) test strip Use as instructed to check blood sugar once daily. E11.9 06/17/19   Ladell Pier,  MD  insulin aspart (NOVOLOG FLEXPEN) 100 UNIT/ML FlexPen 2 units subcut with meals for BS greater than 150. 11/14/19   Ladell Pier, MD  Insulin Pen Needle (PEN NEEDLES) 31G X 8 MM MISC UAD 11/14/19   Ladell Pier, MD  lovastatin (MEVACOR) 20 MG tablet Take 1 tablet (20 mg total) by mouth at bedtime. 06/12/19   Ladell Pier, MD  oxyCODONE-acetaminophen (PERCOCET/ROXICET) 5-325 MG tablet Take 1 tablet by mouth every 6 (six) hours as needed for moderate pain. Patient not taking: Reported on  11/10/2019 09/27/19   Dagoberto Ligas, PA-C    Current Medications Scheduled Meds: Continuous Infusions: . sodium chloride    . desmopressin (DDAVP) IV     PRN Meds:.  CBC Recent Labs  Lab 11/16/19 0401  WBC 4.6  NEUTROABS 3.5  HGB 4.8*  HCT 15.9*  MCV 94.6  PLT 883*   Basic Metabolic Panel Recent Labs  Lab 11/16/19 0401  NA 141  K >7.5*  CL 97*  CO2 14*  GLUCOSE 190*  BUN 160*  CREATININE 44.67*  CALCIUM 7.8*    Physical Exam  Blood pressure (!) 148/99, pulse 71, temperature (!) 87.9 F (31.1 C), temperature source Temporal, resp. rate (!) 22, weight 112 kg, SpO2 100 %. GEN: NAD, in ED Trauma bay ENT: NGT in place  EYES: EOMI CV: regular, no appreciable rub (ambient noise very loud) PULM: nl wob ABD: obese, soft SKIN: LUE AVF with small aneurysm overlying it EXT:3+ LEE, b/l, symmetrical  Assessment 45M ESRD with severe hyperkalemia after no HD for > 58mo    1. Severe hyeprkalemia, EKG changes; s/p med stabilization 2. ESRD, no HD for > 19mo. No vascular access 4. COVID19+ 5. Uremia, azotemia 6. Severe anemia, Hb 4.8 7. Metabolic acidosis 8. CKD-BMD 9. HTN/Vol  Plan 1. Appreciate CCM assistance for placing HD cath 2. HD thereafter: abbreviated to 2.5h and limited Qb to limit risk of dysequilibrium, no heparin, 1K, 2L UF 3. Will likely req daily HD for next sev days  4. Attempt 1u pRBC with HD 5. Will start ESA, check Fe indicies 6. Check Phos 7. COVID therapies per [facility] Patrick Ibarra  [id number] 11/16/2019, 5:50 AM

## 2019-11-16 NOTE — Progress Notes (Addendum)
eLink Physician-Brief Progress Note Patient Name: Patrick Ibarra DOB: March 14, 1981 MRN: [id number]   Date of Service  11/16/2019  HPI/Events of Note  Hypoglycemia s/p D 90 W 1 amp bolus, he's had a number of episodes today, ESRD.  eICU Interventions  D 10 W at 50 ml/ hour, CBG Q 1 hour x 4 then Q 2 hours x 2 then Q 4 hours if stable, BMET result is pending to check K+        Frederik Pear 11/16/2019, 8:40 PM

## 2019-11-16 NOTE — Progress Notes (Addendum)
PCCM progress note  Patient tolerated dialysis well Now hypertensive with SBP's in the 170s Restart home Norvasc, Coreg Hydralazine as needed  Check BMP, mag, Phos, CBC to reevaluate labs  Marshell Garfinkel MD [facility] Please see [email or web address] for pager details.  11/16/2019, 4:48 PM

## 2019-11-16 NOTE — Progress Notes (Signed)
Blood sugar 39. Patient given 8 oz sprite per adult hypoglycemia protocol and ate 4 oz italian ice. Elink notified, will recheck blood glucose.

## 2019-11-16 NOTE — Procedures (Signed)
Central Venous Hemodialysis Catheter Insertion Procedure Note Patrick Ibarra [id number] 06/13/1981  Procedure: Insertion of Central Venous Hemodialysis Catheter Indications: Drug and/or fluid administration and Emergent dialysis   Procedure Details Consent: Risks of procedure as well as the alternatives and risks of each were explained to the (patient/caregiver).  Consent for procedure obtained. Time Out: Verified patient identification, verified procedure, site/side was marked, verified correct patient position, special equipment/implants available, medications/allergies/relevent history reviewed, required imaging and test results available.  Performed  Maximum sterile technique was used including antiseptics, cap, gloves, gown, hand hygiene, mask and sheet. Skin prep: Chlorhexidine; local anesthetic administered A 24 cm trialysis triple lumen catheter was placed in the right femoral vein due to bleeding risk using the Seldinger technique.  Line sutured.  Biopatch and sterile dressing applied.    Evaluation Blood flow good Complications: No apparent complications Patient did tolerate procedure well. Chest X-ray ordered to verify placement.  CXR: n/a.  Procedure performed with ultrasound guidance for real time vessel cannulation.      Kennieth Rad, MSN, AGACNP-BC [facility] 11/16/2019, 7:00 AM

## 2019-11-16 NOTE — Progress Notes (Signed)
Elink notified patient desatting to 70's while sleeping, possible sleep apnea. Also notified of Potassium of 5.9.

## 2019-11-16 NOTE — H&P (Signed)
NAME:  Patrick Ibarra, MRN:  [id number], DOB:  [birth year], LOS: 0 ADMISSION DATE:  11/16/2019, CONSULTATION DATE:  11/16/2019 REFERRING MD:  Dr. Betsey Holiday, CHIEF COMPLAINT:  AMS   Brief History   60 yoM with hx of ESRD-DD who elected to stop dialysis last month presenting from home with AMS, found with sine wave requiring intermittent pacing by EMS found to have K >7.5, COVID positive, and Hgb 4.8.  PCCM called for admit and emergency placement of dialysis placement.  History of present illness    39 year old male with history ESRD, HTN, and DMT2 presenting from home with altered mental status.  Wife reports he had some shortness of breath last week.  Got tested for COVID which was negative.  Over the last two days, he has had ongoing shortness of breath, not feeling well, nausea/ vomiting, and increasing altered mental status.  Wife denies any sick contacts or COVID exposures.  No reports of bleeding.    Of note, patient most recently hospitalized 1/29- 2/2 with hypoxemic respiratory failure, hyperkalemia, and metabolic acidosis requiring emergent dialysis however patient after much discussion, decided to go against medical advice, stop dialysis to follow a holistic approach, and leave hospital without any vascular access. Last iHD 2/1.  Found by EMS altered, bradycardic with sine wave, given calcium gluconate and bicarb.  On arrival to ER, noted to be hypothermic, normotensive to hypertensive, requiring NRB.  Labs noted for K >7.5, BUN 160, sCr 44.67, AG 30, Hgb 4.8, platelets 125, and SARS 2 positive.  Given additional calcium chloride, bicarb, insulin and lokelmia after NGT placed in ER.  Nephrology consulted, PCCM called for admit and placement of emergent dialysis catheter.   Past Medical History  ESRD, HTN, DMT2  Significant Hospital Events   3/16 Admitted   Consults:  Nephrology   Procedures:  3/16 R femoral trialysis catheter   Significant Diagnostic Tests:   Micro Data:  3/16 SARS  2 >> positive  3/16 Flu A/ B negative  Antimicrobials:   Interim history/subjective:    Objective   Blood pressure (!) 148/99, pulse 71, temperature (!) 87.9 F (31.1 C), temperature source Temporal, resp. rate (!) 22, weight 112 kg, SpO2 100 %.       No intake or output data in the 24 hours ending 11/16/19 0544 Filed Weights   11/16/19 0416  Weight: 112 kg   Examination:  Seen with airborne/ contact precautions General:  Ill appearing adult male in mild distress HEENT: MM pink/dry, NGT with some mild nare bleeding/ clot formation, pupils 4/reactive Neuro: Lethargic at times but able to wake up and answer questions appropriately, MAE, generalized weakness  CV: rr, no murmur PULM:  Appears to have some sleep apnea, intermittent snoring, then wakes up, diffuse rhonchi GI: obese, soft, bs active, NT Extremities: cool/dry, +3 LE edema  Skin: no rashes   Resolved Hospital Problem list    Assessment & Plan:   Hyperkalemia  ESRD- last iHD 2/1 Severe AGMA P:  S/p temporizing measures and lokelmia DDAVP x 1 given uremia, risk of bleeding  Emergent placement of temporary HD cath now and emergent iHD Appreciate nephrology input Follow up labs post iHD   Acute hypoxic respiratory failure  COVID positive - CXR c/w pulmonary edema P:  tenuous airway, high risk for intubation Continue airborne and contact precautions Checking inflammatory markers remdesivir and decadron  CXR in am  Hold on cultures and empiric abx for now Check PCT  Fluid removal per iHD  Acute on chronic anemia P:  T&S sent, to be transfused 2 units then post transfusion labs  Sent stool for guaiac  Checking coags, DIC panel   HTN P:  Hold norvasc, coreg for now   DMT2 P:  SSI and CBG q 4   Best practice:  Diet: NPO Pain/Anxiety/Delirium protocol (if indicated): n/a VAP protocol (if indicated): n/a DVT prophylaxis: SCDs for now  GI prophylaxis: PPI Glucose control: CBG Mobility:  BR Code Status: Full  Family Communication: patient and wife updated on plan of care Disposition: ICU   Labs   CBC: Recent Labs  Lab 11/16/19 0401  WBC 4.6  NEUTROABS 3.5  HGB 4.8*  HCT 15.9*  MCV 94.6  PLT 125*    Basic Metabolic Panel: Recent Labs  Lab 11/16/19 0401  NA 141  K >7.5*  CL 97*  CO2 14*  GLUCOSE 190*  BUN 160*  CREATININE 44.67*  CALCIUM 7.8*   GFR: Estimated Creatinine Clearance: 3 mL/min (A) (by C-G formula based on SCr of 44.67 mg/dL (H)). Recent Labs  Lab 11/16/19 0401  WBC 4.6    Liver Function Tests: No results for input(s): AST, ALT, ALKPHOS, BILITOT, PROT, ALBUMIN in the last 168 hours. No results for input(s): LIPASE, AMYLASE in the last 168 hours. No results for input(s): AMMONIA in the last 168 hours.  ABG    Component Value Date/Time   PHART 7.284 (L) 10/01/2019 1031   PCO2ART 40.9 10/01/2019 1031   PO2ART 70.0 (L) 10/01/2019 1031   HCO3 19.4 (L) 10/01/2019 1031   TCO2 21 (L) 10/01/2019 1031   ACIDBASEDEF 7.0 (H) 10/01/2019 1031   O2SAT 92.0 10/01/2019 1031     Coagulation Profile: No results for input(s): INR, PROTIME in the last 168 hours.  Cardiac Enzymes: No results for input(s): CKTOTAL, CKMB, CKMBINDEX, TROPONINI in the last 168 hours.  HbA1C: Hemoglobin A1C  Date/Time Value Ref Range Status  06/10/2019 01:59 PM 4.7 4.0 - 5.6 % Final  09/08/2017 01:49 PM 4.9  Final   Hgb A1c MFr Bld  Date/Time Value Ref Range Status  03/01/2017 03:53 AM 5.8 (H) 4.8 - 5.6 % Final    Comment:    (NOTE)         Pre-diabetes: 5.7 - 6.4         Diabetes: >6.4         Glycemic control for adults with diabetes: <7.0   12/21/2013 05:10 AM 7.5 (H) <5.7 % Final    Comment:    (NOTE)                                                                       According to the ADA Clinical Practice Recommendations for 2011, when HbA1c is used as a screening test:  >=6.5%   Diagnostic of Diabetes Mellitus           (if abnormal result is  confirmed) 5.7-6.4%   Increased risk of developing Diabetes Mellitus References:Diagnosis and Classification of Diabetes Mellitus,Diabetes FEOF,1219,75(OITGP 1):S62-S69 and Standards of Medical Care in         Diabetes - 2011,Diabetes Care,2011,34 (Suppl 1):S11-S61.    CBG: No results for input(s): GLUCAP in the last 168 hours.  Review of Systems:  Review of Systems  Constitutional: Negative for chills and fever.  Respiratory: Positive for cough, hemoptysis and shortness of breath. Negative for sputum production and wheezing.   Cardiovascular: Positive for leg swelling. Negative for chest pain and palpitations.  Gastrointestinal: Positive for nausea and vomiting. Negative for blood in stool and melena.  Neurological: Positive for weakness. Negative for focal weakness.  noted for increasing confusion Decreased UOP  Past Medical History  He,  has a past medical history of Chronic kidney disease, Diabetes mellitus without complication ([place]), Hypertension, LV dysfunction, and Obesity.   Surgical History    Past Surgical History:  Procedure Laterality Date  . AV FISTULA PLACEMENT Left 04/10/2017   Procedure: ARTERIOVENOUS (AV) FISTULA CREATION LEFT ARM;  Surgeon: Conrad Grafton, MD;  Location: [facility];  Service: Vascular;  Laterality: Left;  . ESOPHAGOGASTRODUODENOSCOPY (EGD) WITH PROPOFOL N/A 04/04/2017   Procedure: ESOPHAGOGASTRODUODENOSCOPY (EGD) WITH PROPOFOL;  Surgeon: Carol Ada, MD;  Location: [place];  Service: Endoscopy;  Laterality: N/A;  . 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 INSERTION OF DIALYSIS CATHETER Right 04/10/2017   Procedure: INSERTION OF DIALYSIS CATHETER - RIGHT INTERNAL JUGULAR PLACEMENT;  Surgeon: Conrad , MD;  Location: [facility];  Service: Vascular;  Laterality: Right;    . 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 TONSILLECTOMY AND ADENOIDECTOMY       Social History   reports that he has never smoked. He has never used smokeless tobacco. He reports previous alcohol use. He reports that he does not use drugs.   Family History   His family history includes Diabetes in his mother; Hypertension in his father and mother.   Allergies No Known Allergies   Home Medications  Prior to Admission medications   Medication Sig Start Date End Date Taking? Authorizing Provider  Accu-Chek FastClix Lancets MISC Use as instructed to check blood sugar once daily. E11.9 06/17/19   Ladell Pier, MD  amLODipine (NORVASC) 10 MG tablet Take 1 tablet (10 mg total) by mouth daily. 06/10/19   Ladell Pier, MD  Blood Glucose Monitoring Suppl (ACCU-CHEK GUIDE ME) w/Device KIT 1 kit by Does not apply route daily. Use as instructed to check blood sugar once daily. E11.9 06/17/19   Ladell Pier, MD  carvedilol (COREG) 12.5 MG tablet Take 1 tablet (12.5 mg total) by mouth 2 (two) times daily with a meal. 11/09/19   Ladell Pier, MD  glucose blood (ACCU-CHEK GUIDE) test strip Use as instructed to check blood sugar once daily. E11.9 06/17/19   Ladell Pier, MD  insulin aspart (NOVOLOG FLEXPEN) 100 UNIT/ML FlexPen 2 units subcut with meals for BS greater than 150. 11/14/19   Ladell Pier, MD  Insulin Pen Needle (PEN NEEDLES) 31G X 8 MM MISC UAD 11/14/19   Ladell Pier, MD  lovastatin (MEVACOR) 20 MG tablet Take 1 tablet (20 mg total) by mouth at bedtime. 06/12/19   Ladell Pier, MD  oxyCODONE-acetaminophen (PERCOCET/ROXICET) 5-325 MG tablet Take 1 tablet by mouth every 6 (six) hours as needed for moderate pain. Patient not taking:  Reported on  11/10/2019 09/27/19   Dagoberto Ligas, PA-C     CRITICAL CARE Performed by: Kennieth Rad   Total critical care time: 60 minutes   Critical care time was exclusive of separately billable procedures and treating other patients.   Critical care was necessary to treat or prevent imminent or life-threatening deterioration.   Critical care was time spent personally by me on the following activities: development of treatment plan with patient and/or surrogate as well as nursing, discussions with consultants, evaluation of patient's response to treatment, examination of patient, obtaining history from patient or surrogate, ordering and performing treatments and interventions, ordering and review of laboratory studies, ordering and review of radiographic studies, pulse oximetry and re-evaluation of patient's condition.  Kennieth Rad, MSN, AGACNP-BC [facility] 11/16/2019, 7:13 AM

## 2019-11-16 NOTE — Progress Notes (Signed)
Pt given PO meds, gagged and vomited back up soon after taking. Zofran given and pt states NG tube is what made him gag. Dr. Vaughan Browner says ok to take out NGT and to give PO meds again after NG is out and patient feels ok to swallow. NG removed and meds given and tolerated at 1800.

## 2019-11-16 NOTE — ED Provider Notes (Signed)
[facility] [facility] EMERGENCY DEPARTMENT Provider Note   CSN: [id number] Arrival date & time: 11/16/19  0400     History Chief Complaint  Patient presents with  . Altered Mental Status    Patrick Ibarra is a 39 y.o. male.  Patient presents to the emergency department in severe stress.  Patient has a history of end-stage renal disease on dialysis.  He has not, however, been to dialysis in over a month.  He has brought from home by EMS.  Patient has had an extremely wide QRS with bradycardia.  He is brought to the ER with transcutaneous pacing in place.  Patient not answering any questions at this time.        Past Medical History:  Diagnosis Date  . Chronic kidney disease    dialysis M-W-F  . Diabetes mellitus without complication (HCC)    type 2, diet controlled  . Hypertension   . LV dysfunction   . Obesity     Patient Active Problem List   Diagnosis Date Noted  . Renal failure 11/16/2019  . Chronic renal failure 11/16/2019  . Palliative care by specialist   . Fatigue   . SOB (shortness of breath) 10/01/2019  . Noncompliance by refusing service 10/01/2019  . Arteriovenous fistula for hemodialysis in place, primary ([place]) 09/26/2019  . DNR (do not resuscitate) discussion 09/05/2017  . Diabetes mellitus type II, uncontrolled ([place]) 09/04/2017  . Mallory-Weiss tear 09/04/2017  . Reactive depression 09/04/2017  . Pancreatitis, acute 04/04/2017  . GI bleed 04/03/2017  . Anemia of chronic disease 04/03/2017  . Obesity 03/01/2017  . Anemia of chronic kidney failure 03/01/2017  . Essential hypertension 01/05/2014  . Diabetes mellitus ([place]) 12/24/2011  . ESRD (end stage renal disease) ([place]) 12/24/2011    Past Surgical History:  Procedure Laterality Date  . AV FISTULA PLACEMENT Left 04/10/2017   Procedure: ARTERIOVENOUS (AV) FISTULA CREATION LEFT ARM;  Surgeon: Conrad Sedalia, MD;  Location: [place] [facility];  Service: Vascular;  Laterality: Left;  .  ESOPHAGOGASTRODUODENOSCOPY (EGD) WITH PROPOFOL N/A 04/04/2017   Procedure: ESOPHAGOGASTRODUODENOSCOPY (EGD) WITH PROPOFOL;  Surgeon: Carol Ada, MD;  Location: [place];  Service: Endoscopy;  Laterality: N/A;  . 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 INSERTION OF DIALYSIS CATHETER Right 04/10/2017   Procedure: INSERTION OF DIALYSIS CATHETER - RIGHT INTERNAL JUGULAR PLACEMENT;  Surgeon: Conrad [place] Juliet, MD;  Location: [facility];  Service: Vascular;  Laterality: Right;  . 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 TONSILLECTOMY AND ADENOIDECTOMY         Family History  Problem Relation Age of Onset  . Hypertension Mother   . Diabetes Mother   . Hypertension Father     Social History   Tobacco Use  . Smoking status: Never Smoker  . Smokeless tobacco: Never Used  Substance Use Topics  . Alcohol use: Not Currently  . Drug use: No    Home Medications Prior to Admission medications   Medication Sig Start Date End Date Taking? Authorizing Provider  Accu-Chek FastClix Lancets MISC Use as instructed to check blood sugar once daily. E11.9  06/17/19   Ladell Pier, MD  amLODipine (NORVASC) 10 MG tablet Take 1 tablet (10 mg total) by mouth daily. 06/10/19   Ladell Pier, MD  Blood Glucose Monitoring Suppl (ACCU-CHEK GUIDE ME) w/Device KIT 1 kit by Does not apply route daily. Use as instructed to check blood sugar once daily. E11.9 06/17/19   Ladell Pier, MD  carvedilol (COREG) 12.5 MG tablet Take 1 tablet (12.5 mg total) by mouth 2 (two) times daily with a meal. 11/09/19   Ladell Pier, MD  glucose blood (ACCU-CHEK GUIDE) test strip Use as instructed to check blood sugar once daily. E11.9 06/17/19   Ladell Pier, MD  insulin aspart (NOVOLOG FLEXPEN) 100 UNIT/ML FlexPen 2 units subcut with meals for BS greater than 150. 11/14/19   Ladell Pier, MD  Insulin Pen Needle (PEN NEEDLES) 31G X 8 MM MISC UAD 11/14/19   Ladell Pier, MD  lovastatin (MEVACOR) 20 MG tablet Take 1 tablet (20 mg total) by mouth at bedtime. 06/12/19   Ladell Pier, MD  oxyCODONE-acetaminophen (PERCOCET/ROXICET) 5-325 MG tablet Take 1 tablet by mouth every 6 (six) hours as needed for moderate pain. Patient not taking: Reported on 11/10/2019 09/27/19   Dagoberto Ligas, PA-C    Allergies    Patient has no known allergies.  Review of Systems   Review of Systems  Unable to perform ROS: Acuity of condition    Physical Exam Updated Vital Signs BP (!) 134/91   Pulse 70   Temp (!) 87.9 F (31.1 C) (Temporal)   Resp 18   Wt 112 kg   SpO2 100%   BMI 32.58 kg/m   Physical Exam Constitutional:      General: He is in acute distress.  HENT:     Head: Atraumatic.  Eyes:     Pupils: Pupils are equal, round, and reactive to light.  Cardiovascular:     Rate and Rhythm: Bradycardia present.  Pulmonary:     Breath sounds: Rales present.  Abdominal:     Palpations: Abdomen is soft.  Musculoskeletal:        General: No swelling.     Cervical back: Neck supple.  Skin:    General: Skin is warm.  Neurological:     Mental Status: He is lethargic.     ED Results / Procedures / Treatments   Labs (all labs ordered are listed, but only abnormal results are displayed) Labs Reviewed  RESPIRATORY PANEL BY RT PCR (FLU A&B, COVID) - Abnormal; Notable for the following components:      Result Value   SARS Coronavirus 2 by RT PCR POSITIVE (*)    All other components within normal limits  CBC WITH DIFFERENTIAL/PLATELET - Abnormal; Notable for the following components:   RBC  1.68 (*)    Hemoglobin 4.8 (*)    HCT 15.9 (*)    RDW 18.4 (*)    Platelets 125 (*)    nRBC 0.4 (*)    All other components within normal limits  BASIC METABOLIC PANEL - Abnormal; Notable for the following components:   Potassium >7.5 (*)    Chloride 97 (*)    CO2 14 (*)    Glucose, Bld 190 (*)    BUN 160 (*)    Creatinine, Ser 44.67 (*)    Calcium 7.8 (*)    GFR calc non Af Amer 1 (*)    GFR calc Af Amer 1 (*)    Anion gap 30 (*)  All other components within normal limits  FIBRINOGEN - Abnormal; Notable for the following components:   Fibrinogen >800 (*)    All other components within normal limits  MAGNESIUM  PHOSPHORUS  COMPREHENSIVE METABOLIC PANEL  CBC  I-STAT CHEM 8, ED  PREPARE RBC (CROSSMATCH)  TYPE AND SCREEN    EKG None  Radiology DG Abdomen 1 View  Result Date: 11/16/2019 CLINICAL DATA:  NG tube placement. EXAM: PORTABLE CHEST 1 VIEW COMPARISON:  One-view chest x-ray 11/16/2019 FINDINGS: Side port of the NG tube is in the stomach. Bowel gas pattern is unremarkable. IMPRESSION: Side port of the NG tube is in the stomach. Electronically Signed   By: San Morelle M.D.   On: 11/16/2019 04:54   DG Chest Port 1 View  Result Date: 11/16/2019 CLINICAL DATA:  NG tube placement. EXAM: PORTABLE CHEST 1 VIEW COMPARISON:  One-view chest x-ray 11/16/2019 FINDINGS: Side port of the NG tube is in the stomach. Bowel gas pattern is unremarkable. IMPRESSION: Side port of the NG tube is in the stomach. Electronically Signed   By: San Morelle M.D.   On: 11/16/2019 04:54    Procedures Procedures (including critical care time)  Medications Ordered in ED Medications  0.9 %  sodium chloride infusion (has no administration in time range)  dexamethasone (DECADRON) injection 6 mg (has no administration in time range)  remdesivir 100 mg in sodium chloride 0.9 % 100 mL IVPB (100 mg Intravenous Transfusing/Transfer 11/16/19 0815)    Followed by  remdesivir 100 mg in  sodium chloride 0.9 % 100 mL IVPB (has no administration in time range)  Darbepoetin Alfa (ARANESP) injection 150 mcg (has no administration in time range)  enoxaparin (LOVENOX) injection 30 mg (has no administration in time range)  acetaminophen (TYLENOL) tablet 650 mg (has no administration in time range)  ondansetron (ZOFRAN) injection 4 mg (has no administration in time range)  Thrombi-Pad 3"X3" pad 1 each (has no administration in time range)  insulin aspart (novoLOG) injection 3-9 Units (has no administration in time range)  sodium zirconium cyclosilicate (LOKELMA) packet 10 g (10 g Oral Given 11/16/19 0504)  dextrose 50 % solution 50 mL (50 mLs Intravenous Given 11/16/19 0353)  insulin aspart (novoLOG) injection 10 Units (10 Units Intravenous Given 11/16/19 0358)  sodium bicarbonate injection 50 mEq (50 mEq Intravenous Given 11/16/19 0352)  calcium chloride injection 1 g (1 g Intravenous Given 11/16/19 0400)  desmopressin (DDAVP) 20 mcg in sodium chloride 0.9 % 50 mL IVPB (0 mcg Intravenous Stopped 11/16/19 0644)  heparin injection 4,000 Units (4,000 Units Intravenous Given 11/16/19 0809)    ED Course  I have reviewed the triage vital signs and the nursing notes.  Pertinent labs & imaging results that were available during my care of the patient were reviewed by me and considered in my medical decision making (see chart for details).    MDM Rules/Calculators/A&P                      Patient presented to the emergency department by ambulance.  Patient has a known history of end-stage renal disease requiring dialysis but has not had dialysis in more than a month.  EKG provided by EMS was very consistent with hyperkalemia.  He was being paced at arrival.  Patient had been given a dose of calcium and bicarb during transport.  Pacing was paused and his underlying rhythm was still very bradycardic.  He was given an additional dose of calcium and bicarb  at which time the pacing was discontinued.   He was given insulin for hyperkalemia as well.  This was complicated by the fact that there were notes in the patient's records where there was indicated that he no longer wanted to undergo dialysis.  Patient was not awake and alert enough to give consent or refusal.  I contacted his wife who initially did not want to make that decision for him but then ultimately did state that she would like him to get at least 1 dialysis session.  Nephrology was contacted and arrangements were made for emergent dialysis.  Critical care medicine consulted for admission.  CRITICAL CARE Performed by: Orpah Greek   Total critical care time: 40 minutes  Critical care time was exclusive of separately billable procedures and treating other patients.  Critical care was necessary to treat or prevent imminent or life-threatening deterioration.  Critical care was time spent personally by me on the following activities: development of treatment plan with patient and/or surrogate as well as nursing, discussions with consultants, evaluation of patient's response to treatment, examination of patient, obtaining history from patient or surrogate, ordering and performing treatments and interventions, ordering and review of laboratory studies, ordering and review of radiographic studies, pulse oximetry and re-evaluation of patient's condition.  Final Clinical Impression(s) / ED Diagnoses Final diagnoses:  Hyperkalemia  Uremia    Rx / DC Orders ED Discharge Orders    None       Jeyren Danowski, Gwenyth Allegra, MD 11/16/19 [phone]

## 2019-11-16 NOTE — Procedures (Signed)
I was present at this dialysis session. I have reviewed the session itself and made appropriate changes.   1K. Goal 2.5L. Temp HD cath working well.  Plan for 2nd tx tomorrow. Check labs later today after time for equilibration.  Filed Weights   11/16/19 0416  Weight: 112 kg    Recent Labs  Lab 11/16/19 0700  NA 140  K >7.5*  CL 89*  CO2 20*  GLUCOSE 81  BUN 190*  CREATININE >50.00*  CALCIUM 9.0  PHOS 19.6*    Recent Labs  Lab 11/16/19 0401 11/16/19 0900  WBC 4.6 8.0  NEUTROABS 3.5  --   HGB 4.8* 6.1*  HCT 15.9* 20.0*  MCV 94.6 93.9  PLT 125* 137*    Scheduled Meds: . albuterol  10 mg Nebulization Once  . darbepoetin (ARANESP) injection - DIALYSIS  150 mcg Intravenous Q Tue-HD  . dexamethasone (DECADRON) injection  6 mg Intravenous Daily  . insulin aspart  3-9 Units Subcutaneous Q4H  . Thrombi-Pad  1 each Topical Once   Continuous Infusions: . sodium chloride    . [START ON 11/17/2019] remdesivir 100 mg in NS 100 mL     PRN Meds:.acetaminophen, ondansetron (ZOFRAN) IV   Pearson Grippe  MD 11/16/2019, 2:21 PM

## 2019-11-17 LAB — BASIC METABOLIC PANEL
Anion gap: 25 — ABNORMAL HIGH (ref 5–15)
Anion gap: 26 — ABNORMAL HIGH (ref 5–15)
Anion gap: 22 — ABNORMAL HIGH (ref 5–15)
BUN: 121 mg/dL — ABNORMAL HIGH (ref 6–20)
BUN: 122 mg/dL — ABNORMAL HIGH (ref 6–20)
BUN: 73 mg/dL — ABNORMAL HIGH (ref 6–20)
CO2: 22 mmol/L (ref 22–32)
CO2: 22 mmol/L (ref 22–32)
CO2: 24 mmol/L (ref 22–32)
Calcium: 8.6 mg/dL — ABNORMAL LOW (ref 8.9–10.3)
Calcium: 8.5 mg/dL — ABNORMAL LOW (ref 8.9–10.3)
Calcium: 8.4 mg/dL — ABNORMAL LOW (ref 8.9–10.3)
Chloride: 90 mmol/L — ABNORMAL LOW (ref 98–111)
Chloride: 89 mmol/L — ABNORMAL LOW (ref 98–111)
Chloride: 91 mmol/L — ABNORMAL LOW (ref 98–111)
Creatinine, Ser: 36.84 mg/dL — ABNORMAL HIGH (ref 0.61–1.24)
Creatinine, Ser: 36.63 mg/dL — ABNORMAL HIGH (ref 0.61–1.24)
Creatinine, Ser: 25.15 mg/dL — ABNORMAL HIGH (ref 0.61–1.24)
GFR calc Af Amer: 1 mL/min — ABNORMAL LOW (ref >60)
GFR calc Af Amer: 2 mL/min — ABNORMAL LOW (ref >60)
GFR calc non Af Amer: 1 mL/min — ABNORMAL LOW (ref >60)
GFR calc non Af Amer: 2 mL/min — ABNORMAL LOW (ref >60)
Glucose, Bld: 104 mg/dL — ABNORMAL HIGH (ref 70–99)
Glucose, Bld: 93 mg/dL (ref 70–99)
Glucose, Bld: 151 mg/dL — ABNORMAL HIGH (ref 70–99)
Potassium: 6.3 mmol/L (ref 3.5–5.1)
Potassium: 6.5 mmol/L (ref 3.5–5.1)
Potassium: 4.9 mmol/L (ref 3.5–5.1)
Sodium: 137 mmol/L (ref 135–145)
Sodium: 137 mmol/L (ref 135–145)
Sodium: 137 mmol/L (ref 135–145)

## 2019-11-17 LAB — CBC
HCT: 24.8 % — ABNORMAL LOW (ref 39.0–52.0)
Hemoglobin: 7.6 g/dL — ABNORMAL LOW (ref 13.0–17.0)
MCH: 28.4 pg (ref 26.0–34.0)
MCHC: 30.6 g/dL (ref 30.0–36.0)
MCV: 92.5 fL (ref 80.0–100.0)
Platelets: 149 10*3/uL — ABNORMAL LOW (ref 150–400)
RBC: 2.68 MIL/uL — ABNORMAL LOW (ref 4.22–5.81)
RDW: 17.8 % — ABNORMAL HIGH (ref 11.5–15.5)
WBC: 6.4 10*3/uL (ref 4.0–10.5)
nRBC: 0 % (ref 0.0–0.2)

## 2019-11-17 LAB — TYPE AND SCREEN
ABO/RH(D): O POS
Antibody Screen: NEGATIVE
Unit division: 0
Unit division: 0

## 2019-11-17 LAB — FERRITIN: Ferritin: >7500 ng/mL — ABNORMAL HIGH (ref 24–336)

## 2019-11-17 LAB — BPAM RBC
Blood Product Expiration Date: 202104132359
Blood Product Expiration Date: 202104142359
ISSUE DATE / TIME: 202103161228
ISSUE DATE / TIME: 202103161442
Unit Type and Rh: 5100
Unit Type and Rh: 5100

## 2019-11-17 LAB — GLUCOSE, CAPILLARY
Glucose-Capillary: 55 mg/dL — ABNORMAL LOW (ref 70–99)
Glucose-Capillary: 89 mg/dL (ref 70–99)
Glucose-Capillary: 92 mg/dL (ref 70–99)
Glucose-Capillary: 70 mg/dL (ref 70–99)
Glucose-Capillary: 102 mg/dL — ABNORMAL HIGH (ref 70–99)
Glucose-Capillary: 131 mg/dL — ABNORMAL HIGH (ref 70–99)
Glucose-Capillary: 202 mg/dL — ABNORMAL HIGH (ref 70–99)

## 2019-11-17 LAB — POTASSIUM
Potassium: 6.5 mmol/L (ref 3.5–5.1)
Potassium: 4.2 mmol/L (ref 3.5–5.1)

## 2019-11-17 LAB — PHOSPHORUS: Phosphorus: 12.6 mg/dL — ABNORMAL HIGH (ref 2.5–4.6)

## 2019-11-17 LAB — D-DIMER, QUANTITATIVE: D-Dimer, Quant: 6.53 ug/mL-FEU — ABNORMAL HIGH (ref 0.00–0.50)

## 2019-11-17 LAB — C-REACTIVE PROTEIN: CRP: 13.9 mg/dL — ABNORMAL HIGH (ref <1.0)

## 2019-11-17 LAB — MAGNESIUM: Magnesium: 3.3 mg/dL — ABNORMAL HIGH (ref 1.7–2.4)

## 2019-11-17 MED ORDER — SODIUM POLYSTYRENE SULFONATE 15 GM/60ML PO SUSP
30.0000 g | Freq: Once | ORAL | Status: AC
  Administered 2019-11-17: 30 g via ORAL
  Filled 2019-11-17: qty 120

## 2019-11-17 MED ORDER — SODIUM BICARBONATE 8.4 % IV SOLN
50.0000 meq | Freq: Once | INTRAVENOUS | Status: AC
  Administered 2019-11-17: 50 meq via INTRAVENOUS
  Filled 2019-11-17: qty 50

## 2019-11-17 MED ORDER — SODIUM POLYSTYRENE SULFONATE 15 GM/60ML PO SUSP
15.0000 g | Freq: Once | ORAL | Status: AC
  Administered 2019-11-17: 15 g
  Filled 2019-11-17: qty 60

## 2019-11-17 MED ORDER — HEPARIN SODIUM (PORCINE) 5000 UNIT/ML IJ SOLN
5000.0000 [IU] | Freq: Three times a day (TID) | INTRAMUSCULAR | Status: DC
  Administered 2019-11-17 – 2019-11-18 (×3): 5000 [IU] via SUBCUTANEOUS
  Filled 2019-11-17 (×3): qty 1

## 2019-11-17 NOTE — Progress Notes (Signed)
NURSING PROGRESS NOTE  Patrick Ibarra [id number] Transfer Data: 11/17/2019 4:39 PM Attending Provider: Marshell Garfinkel, MD Patrick Ibarra:[id number], Patrick Batman, MD Code Status: Full  Patrick Ibarra is a 39 y.o. male patient transferred from 28M  -No acute distress noted.  -No complaints of shortness of breath.  -No complaints of chest pain.     Last Documented Vital Signs: Blood pressure (!) 132/102, pulse 88, temperature 98.3 F (36.8 C), temperature source Oral, resp. rate 15, weight 112.6 kg, SpO2 100 %.  IV Fluids:  IV in place, occlusive dsg intact without redness, IV cath hand right, condition patent and no redness   Allergies:  Patient has no known allergies.  Past Medical History:   has a past medical history of Chronic kidney disease, Diabetes mellitus without complication ([place]), Hypertension, LV dysfunction, and Obesity.  Past Surgical History:   has a past surgical history that includes Tonsillectomy and adenoidectomy; Esophagogastroduodenoscopy (egd) with propofol (N/A, 04/04/2017); AV fistula placement (Left, 04/10/2017); Insertion of dialysis catheter (Right, 04/10/2017); Fistula superficialization (Left, 06/22/2019); and Thrombectomy and revision of arterioventous (av) goretex  graft (Left, 09/26/2019).  Social History:   reports that he has never smoked. He has never used smokeless tobacco. He reports previous alcohol use. He reports that he does not use drugs.  Skin: intact no skin breakdown noted.   Patient/Family orientated to room. Information packet given to patient/family. Admission inpatient armband information verified with patient/family to include name and date of birth and placed on patient arm. Side rails up x 2, fall assessment and education completed with patient/family. Patient/family able to verbalize understanding of risk associated with falls and verbalized understanding to call for assistance before getting out of bed. Call light within reach. Patient/family able to voice and  demonstrate understanding of unit orientation instructions.

## 2019-11-17 NOTE — Progress Notes (Signed)
Admit: 11/16/2019 LOS: 1  83M ESRD stopped HD x6wk presented with severe hyperkalemia, anemia, COVID19  Subjective:  . HD yesterday, reduced Tx dose 2/2 risk of dysequilibrium, still hyperkalemic afterwards . Remains on [place] .  BPs stable/elevated . On dex/remdesivir . Began discussion of long term plan, he wants wife present in conversation, is vague in his responses  03/16 0701 - 03/17 0700 In: 1546.4 [P.O.:660; I.V.:511.4; Blood:275; IV Piggyback:100] Out: 300 [Emesis/NG output:300]  Filed Weights   11/16/19 0416 11/17/19 0423  Weight: 112 kg 112.6 kg    Scheduled Meds: . albuterol  10 mg Nebulization Once  . amLODipine  10 mg Oral Daily  . carvedilol  12.5 mg Oral BID  . Chlorhexidine Gluconate Cloth  6 each Topical Daily  . [START ON 11/23/2019] darbepoetin (ARANESP) injection - DIALYSIS  150 mcg Intravenous Q Tue-HD  . dexamethasone (DECADRON) injection  6 mg Intravenous Daily  . heparin injection (subcutaneous)  5,000 Units Subcutaneous Q8H  . insulin aspart  3-9 Units Subcutaneous Q4H  . Thrombi-Pad  1 each Topical Once   Continuous Infusions: . sodium chloride    . dextrose 50 mL/hr at 11/17/19 0700  . remdesivir 100 mg in NS 100 mL     PRN Meds:.acetaminophen, hydrALAZINE, labetalol, ondansetron (ZOFRAN) IV  Current Labs: reviewed    Physical Exam:  Blood pressure (!) 149/112, pulse 82, temperature 97.7 F (36.5 C), temperature source Oral, resp. rate 12, weight 112.6 kg, SpO2 100 %. NAD RRR nl s1s2 no rub CTAB nl wob 3+ LEE Femoral Temp HD cath bandaged  A 1. Severe hyeprkalemia, EKG changes; s/p med stabilization and emergent HD 3/16; improved not resolved yet 2. ESRD, no HD for > 43mo; long term plan TBD moving forward 3. No vascular access; s/p temp femoral cath 11/16/19 CCM 4. COVID19+ remdisiver/dexamethason 5. Uremia, azotemia; improving with HD 6. Severe anemia, Hb 4.8; transfued, 7.6 this AM; high ferritin 2/2 #4; on ESA 7. Metabolic acidosis;  resolved 8. CKD-BMD;uncontrolled, hyperphosphatemic and noncompliant 9. HTN/Vol; remains hypervolemic  P . HD again today, 1K, 3L UF, gradually inc dialysis dose to limit risk of dysequilibrium; likey Tx #3 tomorrow, will assess first . Will discuss [facility] with pt again tomorrow . Will need GOC / palliative involvement .  Marland Kitchen Medication Issues; o Preferred narcotic agents for pain control are hydromorphone, fentanyl, and methadone. Morphine should not be used.  o Baclofen should be avoided o Avoid oral sodium phosphate and magnesium citrate based laxatives / bowel preps    Pearson Grippe MD 11/17/2019, 9:28 AM  Recent Labs  Lab 11/16/19 0700 11/16/19 0700 11/16/19 2013 11/16/19 2013 11/17/19 0023 11/17/19 0254 11/17/19 0426  NA 140   < > 138  --  137  --  137  K >7.5*   < > 5.9*   < > 6.3* 6.5* 6.5*  CL 89*   < > 92*  --  90*  --  89*  CO2 20*   < > 20*  --  22  --  22  GLUCOSE 81   < > 128*  --  104*  --  93  BUN 190*   < > 123*  --  121*  --  122*  CREATININE >50.00*   < > 36.73*  --  36.84*  --  36.63*  CALCIUM 9.0   < > 8.7*  --  8.6*  --  8.5*  PHOS 19.6*  --   --   --   --  12.6*  --    < > = values in this interval not displayed.   Recent Labs  Lab 11/16/19 0401 11/16/19 0900 11/17/19 0254  WBC 4.6 8.0 6.4  NEUTROABS 3.5  --   --   HGB 4.8* 6.1* 7.6*  HCT 15.9* 20.0* 24.8*  MCV 94.6 93.9 92.5  PLT 125* 137* 149*

## 2019-11-17 NOTE — Progress Notes (Signed)
Patient not placed on CPAP due to COVID positive.

## 2019-11-17 NOTE — Progress Notes (Addendum)
   NAME:  Patrick Ibarra, MRN:  [id number], DOB:  [birth year], LOS: 1 ADMISSION DATE:  11/16/2019, CONSULTATION DATE:  11/16/2019 REFERRING MD:  Dr. Betsey Holiday, CHIEF COMPLAINT:  AMS   Brief History   35 yoM with hx of ESRD-DD who elected to stop dialysis last month presenting from home with AMS, found with sine wave requiring intermittent pacing by EMS found to have K >7.5, COVID positive, and Hgb 4.8.  PCCM called for admit and emergency placement of dialysis placement.  Of note, patient most recently hospitalized 1/29- 2/2 with hypoxemic respiratory failure, hyperkalemia, and metabolic acidosis requiring emergent dialysis however patient after much discussion, decided to go against medical advice, stop dialysis to follow a holistic approach, and leave hospital without any vascular access. Last iHD 2/1.  Past Medical History  ESRD, HTN, DMT2  Significant Hospital Events   3/16 Admitted   Consults:  Nephrology   Procedures:  3/16 R femoral trialysis catheter   Significant Diagnostic Tests:   Micro Data:  3/16 SARS 2 >> positive  3/16 Flu A/ B negative  Antimicrobials:   Interim history/subjective:  Feeling better with reduced nausea.  Taking clears without issue K remains high  Objective   Blood pressure (!) 149/112, pulse 82, temperature 97.7 F (36.5 C), temperature source Oral, resp. rate 12, weight 112.6 kg, SpO2 100 %.        Intake/Output Summary (Last 24 hours) at 11/17/2019 0854 Last data filed at 11/17/2019 0700 Gross per 24 hour  Intake 1446.39 ml  Output 300 ml  Net 1146.39 ml   Filed Weights   11/16/19 0416 11/17/19 0423  Weight: 112 kg 112.6 kg   Examination:   Gen:      No acute distress HEENT:  EOMI, sclera anicteric Neck:     No masses; no thyromegaly Lungs:    Clear to auscultation bilaterally; normal respiratory effort CV:         Regular rate and rhythm; no murmurs Abd:      + bowel sounds; soft, non-tender; no palpable masses, no distension Ext:     No edema; adequate peripheral perfusion Skin:      Warm and dry; no rash Neuro: alert and oriented x 3  Resolved Hospital Problem list    Assessment & Plan:  Hyperkalemia  ESRD- last iHD 2/1 Severe AGMA P:  Underwent dialysis yesterday through a temporary femoral catheter Plan for repeat hemodialysis today He needs permanent dialysis access if he wants to continue renal replacement as an outpatient. Discussed with patient.  He wants to talk to his wife first before making any decision. Management per nephrology  Acute hypoxic respiratory failure > improved COVID positive - CXR c/w pulmonary edema P:  Do not think COVID-19 is the main issue here Continue remdesivir, Decadron Wean down oxygen as tolerated  Anemia of chronic disease, no active bleeding noted. P:  Hemoglobin stable posttransfusion Follow CBC  HTN P:  Restart Norvasc, Coreg  DMT2 P:  SSI and CBG q 4   Best practice:  Diet: Advance diet Pain/Anxiety/Delirium protocol (if indicated): n/a VAP protocol (if indicated): n/a DVT prophylaxis: SCDs for now  GI prophylaxis: PPI Glucose control: CBG Mobility: BR Code Status: Full  Family Communication: patient updated on plan of care Disposition: Stable for transfer out of ICU after second round of hemodialysis and to [place] Please see [email or web address] for pager details.  11/17/2019, 8:54 AM

## 2019-11-17 NOTE — Progress Notes (Signed)
eLink Physician-Brief Progress Note Patient Name: Patrick Ibarra DOB: 1981-05-23 MRN: [id number]   Date of Service  11/17/2019  HPI/Events of Note  K+ 6.5  eICU Interventions  Moderate hyperkalemia protocol orders entered.        [name] 11/17/2019, 5:17 AM

## 2019-11-17 NOTE — Progress Notes (Signed)
eLink Physician-Brief Progress Note Patient Name: Patrick Ibarra DOB: 1980-11-09 MRN: [id number]   Date of Service  11/17/2019  HPI/Events of Note  K+ 6.5  eICU Interventions  Pt is for hemodialysis this a.m.          Kerry Kass Tennile Styles 11/17/2019, 6:59 AM

## 2019-11-17 NOTE — Progress Notes (Signed)
Family not called per pts request. Pt states he has already spoken with them.

## 2019-11-18 ENCOUNTER — Inpatient Hospital Stay (HOSPITAL_COMMUNITY): Payer: Medicare Other

## 2019-11-18 DIAGNOSIS — N186 End stage renal disease: Secondary | ICD-10-CM

## 2019-11-18 DIAGNOSIS — U071 COVID-19: Principal | ICD-10-CM

## 2019-11-18 DIAGNOSIS — I10 Essential (primary) hypertension: Secondary | ICD-10-CM

## 2019-11-18 DIAGNOSIS — E875 Hyperkalemia: Secondary | ICD-10-CM

## 2019-11-18 DIAGNOSIS — J1282 Pneumonia due to coronavirus disease 2019: Secondary | ICD-10-CM

## 2019-11-18 LAB — BASIC METABOLIC PANEL
Anion gap: 19 — ABNORMAL HIGH (ref 5–15)
Anion gap: 22 — ABNORMAL HIGH (ref 5–15)
BUN: 77 mg/dL — ABNORMAL HIGH (ref 6–20)
BUN: 79 mg/dL — ABNORMAL HIGH (ref 6–20)
CO2: 24 mmol/L (ref 22–32)
CO2: 23 mmol/L (ref 22–32)
Calcium: 8 mg/dL — ABNORMAL LOW (ref 8.9–10.3)
Calcium: 8.1 mg/dL — ABNORMAL LOW (ref 8.9–10.3)
Chloride: 93 mmol/L — ABNORMAL LOW (ref 98–111)
Chloride: 91 mmol/L — ABNORMAL LOW (ref 98–111)
Creatinine, Ser: 26.17 mg/dL — ABNORMAL HIGH (ref 0.61–1.24)
Creatinine, Ser: 26.69 mg/dL — ABNORMAL HIGH (ref 0.61–1.24)
GFR calc Af Amer: 2 mL/min — ABNORMAL LOW (ref >60)
GFR calc Af Amer: 2 mL/min — ABNORMAL LOW (ref >60)
GFR calc non Af Amer: 2 mL/min — ABNORMAL LOW (ref >60)
GFR calc non Af Amer: 2 mL/min — ABNORMAL LOW (ref >60)
Glucose, Bld: 148 mg/dL — ABNORMAL HIGH (ref 70–99)
Glucose, Bld: 141 mg/dL — ABNORMAL HIGH (ref 70–99)
Potassium: 4.8 mmol/L (ref 3.5–5.1)
Potassium: 4.7 mmol/L (ref 3.5–5.1)
Sodium: 136 mmol/L (ref 135–145)
Sodium: 136 mmol/L (ref 135–145)

## 2019-11-18 LAB — COMPREHENSIVE METABOLIC PANEL
ALT: 150 U/L — ABNORMAL HIGH (ref 0–44)
AST: 87 U/L — ABNORMAL HIGH (ref 15–41)
Albumin: 2.3 g/dL — ABNORMAL LOW (ref 3.5–5.0)
Alkaline Phosphatase: 57 U/L (ref 38–126)
Anion gap: 21 — ABNORMAL HIGH (ref 5–15)
BUN: 79 mg/dL — ABNORMAL HIGH (ref 6–20)
CO2: 24 mmol/L (ref 22–32)
Calcium: 7.9 mg/dL — ABNORMAL LOW (ref 8.9–10.3)
Chloride: 92 mmol/L — ABNORMAL LOW (ref 98–111)
Creatinine, Ser: 26.67 mg/dL — ABNORMAL HIGH (ref 0.61–1.24)
GFR calc Af Amer: 2 mL/min — ABNORMAL LOW (ref >60)
GFR calc non Af Amer: 2 mL/min — ABNORMAL LOW (ref >60)
Glucose, Bld: 118 mg/dL — ABNORMAL HIGH (ref 70–99)
Potassium: 4.5 mmol/L (ref 3.5–5.1)
Sodium: 137 mmol/L (ref 135–145)
Total Bilirubin: 1.2 mg/dL (ref 0.3–1.2)
Total Protein: 6 g/dL — ABNORMAL LOW (ref 6.5–8.1)

## 2019-11-18 LAB — GLUCOSE, CAPILLARY
Glucose-Capillary: 106 mg/dL — ABNORMAL HIGH (ref 70–99)
Glucose-Capillary: 126 mg/dL — ABNORMAL HIGH (ref 70–99)
Glucose-Capillary: 148 mg/dL — ABNORMAL HIGH (ref 70–99)
Glucose-Capillary: 154 mg/dL — ABNORMAL HIGH (ref 70–99)
Glucose-Capillary: 154 mg/dL — ABNORMAL HIGH (ref 70–99)
Glucose-Capillary: 158 mg/dL — ABNORMAL HIGH (ref 70–99)

## 2019-11-18 LAB — C-REACTIVE PROTEIN: CRP: 9.9 mg/dL — ABNORMAL HIGH (ref <1.0)

## 2019-11-18 LAB — CBC
HCT: 23.1 % — ABNORMAL LOW (ref 39.0–52.0)
Hemoglobin: 7.4 g/dL — ABNORMAL LOW (ref 13.0–17.0)
MCH: 28.9 pg (ref 26.0–34.0)
MCHC: 32 g/dL (ref 30.0–36.0)
MCV: 90.2 fL (ref 80.0–100.0)
Platelets: 172 10*3/uL (ref 150–400)
RBC: 2.56 MIL/uL — ABNORMAL LOW (ref 4.22–5.81)
RDW: 16.7 % — ABNORMAL HIGH (ref 11.5–15.5)
WBC: 5.5 10*3/uL (ref 4.0–10.5)
nRBC: 0 % (ref 0.0–0.2)

## 2019-11-18 LAB — FERRITIN: Ferritin: 4744 ng/mL — ABNORMAL HIGH (ref 24–336)

## 2019-11-18 LAB — MAGNESIUM: Magnesium: 2.5 mg/dL — ABNORMAL HIGH (ref 1.7–2.4)

## 2019-11-18 LAB — PHOSPHORUS: Phosphorus: 9.5 mg/dL — ABNORMAL HIGH (ref 2.5–4.6)

## 2019-11-18 LAB — D-DIMER, QUANTITATIVE: D-Dimer, Quant: 5.04 ug/mL-FEU — ABNORMAL HIGH (ref 0.00–0.50)

## 2019-11-18 MED ORDER — INSULIN ASPART 100 UNIT/ML ~~LOC~~ SOLN: 0.0000 [IU] | Freq: Every day | SUBCUTANEOUS | Status: DC

## 2019-11-18 MED ORDER — INSULIN ASPART 100 UNIT/ML ~~LOC~~ SOLN
0.0000 [IU] | Freq: Three times a day (TID) | SUBCUTANEOUS | Status: DC
  Administered 2019-11-18 – 2019-11-19 (×2): 2 [IU] via SUBCUTANEOUS
  Administered 2019-11-19: 1 [IU] via SUBCUTANEOUS
  Administered 2019-11-19: 2 [IU] via SUBCUTANEOUS
  Administered 2019-11-20: 1 [IU] via SUBCUTANEOUS

## 2019-11-18 MED ORDER — MIDAZOLAM HCL 2 MG/2ML IJ SOLN
INTRAMUSCULAR | Status: AC
  Filled 2019-11-18: qty 2

## 2019-11-18 MED ORDER — HEPARIN SODIUM (PORCINE) 5000 UNIT/ML IJ SOLN
5000.0000 [IU] | Freq: Three times a day (TID) | INTRAMUSCULAR | Status: DC
  Administered 2019-11-19 – 2019-11-20 (×3): 5000 [IU] via SUBCUTANEOUS
  Filled 2019-11-18 (×3): qty 1

## 2019-11-18 MED ORDER — FENTANYL CITRATE (PF) 100 MCG/2ML IJ SOLN
INTRAMUSCULAR | Status: AC
  Filled 2019-11-18: qty 2

## 2019-11-18 MED ORDER — LIDOCAINE-EPINEPHRINE 1 %-1:100000 IJ SOLN
INTRAMUSCULAR | Status: AC
  Filled 2019-11-18: qty 1

## 2019-11-18 MED ORDER — HEPARIN SODIUM (PORCINE) 1000 UNIT/ML IJ SOLN
INTRAMUSCULAR | Status: AC
  Filled 2019-11-18: qty 1

## 2019-11-18 MED ORDER — CEFAZOLIN SODIUM-DEXTROSE 2-4 GM/100ML-% IV SOLN
INTRAVENOUS | Status: AC
  Filled 2019-11-18: qty 100

## 2019-11-18 NOTE — Progress Notes (Signed)
Admit: 11/16/2019 LOS: 2  67M ESRD stopped HD x6wk presented with severe hyperkalemia, anemia, COVID19  Subjective:  . 2nd HD yesterday, K now normal. Labs improved . Out of ICU to 5W . BP stable, still up a tad, 3L UF yesterday . Again discussed long term HD plans. Has temp fem cath, discussed bare minimum needs TDC and will need permanent access too; he remains noncommittal about AVG/F, but is willing to have TDC . He says the holistic approach was working, I reminded him he was near death on 24-Dec-2022 morning.  . On dex/remdesivir  03/17 0701 - 03/18 0700 In: 1137.8 [P.O.:480; I.V.:548.2; IV Piggyback:109.6] Out: 3000   Filed Weights   11/16/19 0416 11/17/19 0423 11/18/19 0500  Weight: 112 kg 112.6 kg 110.8 kg    Scheduled Meds: . albuterol  10 mg Nebulization Once  . amLODipine  10 mg Oral Daily  . carvedilol  12.5 mg Oral BID  . Chlorhexidine Gluconate Cloth  6 each Topical Daily  . [START ON 11/23/2019] darbepoetin (ARANESP) injection - DIALYSIS  150 mcg Intravenous Q Tue-HD  . dexamethasone (DECADRON) injection  6 mg Intravenous Daily  . [START ON 11/19/2019] heparin injection (subcutaneous)  5,000 Units Subcutaneous Q8H  . insulin aspart  3-9 Units Subcutaneous Q4H  . Thrombi-Pad  1 each Topical Once   Continuous Infusions: . sodium chloride    . dextrose 50 mL/hr at 11/17/19 0800  . remdesivir 100 mg in NS 100 mL 100 mg (11/18/19 0849)   PRN Meds:.acetaminophen, hydrALAZINE, labetalol, ondansetron (ZOFRAN) IV  Current Labs: reviewed    Physical Exam:  Blood pressure (!) 147/95, pulse 90, temperature 98 F (36.7 C), temperature source Oral, resp. rate 15, weight 110.8 kg, SpO2 94 %. NAD RRR nl s1s2 no rub CTAB nl wob 3+ LEE Femoral Temp HD cath bandaged  A 1. Severe hyeprkalemia, EKG changes; s/p med stabilization and emergent HD 3/16; resolved 2. ESRD, no HD for > 62mo; long term plan TBD moving forward; agrees to further HD while in hospital 3. No vascular  access; s/p temp femoral cath 11/16/19 CCM 4. COVID19+ remdisiver/dexamethason 5. Uremia, azotemia; improving with HD 6. Severe anemia, Hb 4.8; transfued, 7.4 this AM; high ferritin 2/2 #4; on ESA 7. Metabolic acidosis; resolved 8. CKD-BMD;uncontrolled, hyperphosphatemic and noncompliant 9. HTN/Vol; remains hypervolemic  P . IR consult for TDC . Cont to discuss AVG/F . He as of now won't commit to cont HD when leaving hospital . HD tomorrow 2K, 3.5h, 4L UF, tight heparin . Medication Issues; o Preferred narcotic agents for pain control are hydromorphone, fentanyl, and methadone. Morphine should not be used.  o Baclofen should be avoided o Avoid oral sodium phosphate and magnesium citrate based laxatives / bowel preps    Pearson Grippe MD 11/18/2019, 9:22 AM  Recent Labs  Lab 11/16/19 0700 11/16/19 2013 11/17/19 0254 11/17/19 0426 11/17/19 1800 11/17/19 2356 11/18/19 0556  NA 140   < >  --    < > 137 136 137  K >7.5*   < > 6.5*   < > 4.9 4.8 4.5  CL 89*   < >  --    < > 91* 93* 92*  CO2 20*   < >  --    < > 24 24 24   GLUCOSE 81   < >  --    < > 151* 148* 118*  BUN 190*   < >  --    < > 73* 77* 79*  CREATININE >50.00*   < >  --    < > 25.15* 26.17* PENDING  CALCIUM 9.0   < >  --    < > 8.4* 8.0* 7.9*  PHOS 19.6*  --  12.6*  --   --   --  9.5*   < > = values in this interval not displayed.   Recent Labs  Lab 11/16/19 0401 11/16/19 0401 11/16/19 0900 11/17/19 0254 11/18/19 0556  WBC 4.6   < > 8.0 6.4 5.5  NEUTROABS 3.5  --   --   --   --   HGB 4.8*   < > 6.1* 7.6* 7.4*  HCT 15.9*   < > 20.0* 24.8* 23.1*  MCV 94.6   < > 93.9 92.5 90.2  PLT 125*   < > 137* 149* 172   < > = values in this interval not displayed.

## 2019-11-18 NOTE — Progress Notes (Addendum)
PROGRESS NOTE                                                                                                                                                                                                             Patient Demographics:    Patrick Ibarra, is a 39 y.o. male, DOB - 05/16/1981, JIR:[id number]  Outpatient Primary MD for the patient is Ladell Pier, MD   Admit date - 11/16/2019   LOS - 2  Chief Complaint  Patient presents with  . Altered Mental Status       Brief Narrative: Patient is a 39 y.o. male with PMHx of-used to be on hemodialysis-but elected to stop hemodialysis last month-presented with acute uremic encephalopathy, hyperkalemia acute hypoxic respiratory failure secondary to volume overload, severe anemia, was found to be Covid positive-admitted to the [facility] clinical stability-transfer to the [facility].  See below for further details.  Significant Events: 1/29-2/2: Admit for hypoxic respiratory failure, hyperkalemia, metabolic acidosis-requiring emergent HD-left AMA-did not want any further HD-as patient wanted to pursue holistic therapy.  Last HD on 2/1. 3/16>> admit to [facility] ICU-volume overload, hyperkalemia, bradycardia with sine wave, hemoglobin of 4.8 and acute hypoxic respiratory failure secondary to pulmonary pneumonia.    Microbiology data: 3/16>> COVID-19 positive  COVID-19 medications Remdesivir: 3/15>> Decadron: 3/17>>  DVT prophylaxis: Subcutaneous heparin  Procedures: 3/16>> right femoral paralysis catheter  Consults: PCCM, nephrology   Subjective:    Patrick Ibarra today was lying in bed comfortably.  Denies any chest pain or shortness of breath.   Assessment  & Plan :   Acute Hypoxic Resp Failure due to pulmonary edema in the setting of volume overload due to missed HD: Improved after HD-on room air this morning.  COVID-19 pneumonia: On room  air-suspect respiratory failure was from pulmonary edema rather than COVID-19 pneumonia.  CRP downtrending-continue steroids and remdesivir.  Fever: afebrile  O2 requirements:  SpO2: 94 % O2 Flow Rate (L/min): 2 L/min   COVID-19 Labs: Recent Labs    11/17/19 0254 11/18/19 0556  DDIMER 6.53* 5.04*  FERRITIN >7,500* 4,744*  CRP 13.9* 9.9*       Component Value Date/Time   BNP 1,812.7 (H) 03/01/2017 0405    No results for input(s): PROCALCITON in the last 168 hours.  Lab Results  Component Value Date   SARSCOV2NAA  POSITIVE (A) 11/16/2019   [place] NEGATIVE 10/01/2019   [place] NEGATIVE 09/26/2019   [place] NEGATIVE 09/25/2019     Prone/Incentive Spirometry: encouraged  incentive spirometry use 3-4/hour.  Severe hyperkalemia: Resolved-after emergent HD.  ESRD: Refused HD last admit-and favor a more holistic approach and subsequently signed out AMA.  He still is debating whether or not to go ahead with a tunneled hemodialysis catheter placement today.  I have had a long discussion with this patient-counseled him regarding the importance of hemodialysis-the life-threatening/life disabling effects of being noncompliant/refusing dialysis treatment.  He is awake/alert-and seems to comprehend the risks-however at this time is still debating whether or not he wants to pursue holistic treatment.  Nephrology following.  Transaminitis: Not sure what the exact etiology is-could be from COVID-19-or from hepatic congestion due to volume overload in the setting of missed HD.  LFTs slowly downtrending.  Nontender abdominal exam-plan is to follow for now.  Elevated D-dimer: Not hypoxic-doubt VTE-but given COVID-19-we will go out and check lower extremity Dopplers.  Severe anemia: Secondary to noncompliant with HD-missing Aranesp injections with hemodialysis-2 units of PRBC on 3/16-hemoglobin relatively stable.  No indication of any blood loss at this point.  Continue Aranesp per  nephrology.  Hypertension: Controlled-continue with amlodipine, Coreg  DM-2: Change to SSI-sensitive scale and follow CBGs.  Recent Labs    11/18/19 0439 11/18/19 0744 11/18/19 1143  GLUCAP 126* 106* 158*   Obesity: Estimated body mass index is 32.23 kg/m as calculated from the following:   Height as of 11/10/19: 6\' 1"  (1.854 m).   Weight as of this encounter: 110.8 kg.    ABG:    Component Value Date/Time   PHART 7.284 (L) 10/01/2019 1031   PCO2ART 40.9 10/01/2019 1031   PO2ART 70.0 (L) 10/01/2019 1031   HCO3 19.4 (L) 10/01/2019 1031   TCO2 21 (L) 10/01/2019 1031   ACIDBASEDEF 7.0 (H) 10/01/2019 1031   O2SAT 92.0 10/01/2019 1031    Vent Settings: N/A  Condition - Extremely Guarded-  Family Communication  :  Called spouse-getting busy ring tone 3/18  Code Status :  Full Code  Diet :  Diet Order            Diet NPO time specified Except for: Sips with Meds  Diet effective now               Disposition Plan  :  Remain hospitalized  Barriers to discharge:Complete 5 days of IV Remdesivir  Antimicorbials  :    Anti-infectives (From admission, onward)   Start     Dose/Rate Route Frequency Ordered Stop   11/17/19 1000  remdesivir 100 mg in sodium chloride 0.9 % 100 mL IVPB  Status:  Discontinued     100 mg 200 mL/hr over 30 Minutes Intravenous Daily 11/16/19 0557 11/16/19 0557   11/17/19 1000  remdesivir 100 mg in sodium chloride 0.9 % 100 mL IVPB     100 mg 200 mL/hr over 30 Minutes Intravenous Daily 11/16/19 0558 11/21/19 0959   11/16/19 0630  remdesivir 100 mg in sodium chloride 0.9 % 100 mL IVPB     100 mg 200 mL/hr over 30 Minutes Intravenous Every 30 min 11/16/19 0558 11/16/19 0844   11/16/19 0600  remdesivir 200 mg in sodium chloride 0.9% 250 mL IVPB  Status:  Discontinued     200 mg 580 mL/hr over 30 Minutes Intravenous Once 11/16/19 0557 11/16/19 0557      Inpatient Medications  Scheduled Meds: . albuterol  10 mg Nebulization Once  .  amLODipine  10 mg Oral Daily  . carvedilol  12.5 mg Oral BID  . Chlorhexidine Gluconate Cloth  6 each Topical Daily  . [START ON 11/23/2019] darbepoetin (ARANESP) injection - DIALYSIS  150 mcg Intravenous Q Tue-HD  . dexamethasone (DECADRON) injection  6 mg Intravenous Daily  . [START ON 11/19/2019] heparin injection (subcutaneous)  5,000 Units Subcutaneous Q8H  . insulin aspart  3-9 Units Subcutaneous Q4H  . Thrombi-Pad  1 each Topical Once   Continuous Infusions: . sodium chloride    . dextrose 50 mL/hr at 11/18/19 1207  . remdesivir 100 mg in NS 100 mL 100 mg (11/18/19 0849)   PRN Meds:.acetaminophen, hydrALAZINE, labetalol, ondansetron (ZOFRAN) IV   Time Spent in minutes  25  See all Orders from today for further details   Oren Binet M.D on 11/18/2019 at 1:33 PM  To page go to [email or web address] - use universal password  [facility] -  Office  [phone]    Objective:   Vitals:   11/17/19 2201 11/18/19 0500 11/18/19 0606 11/18/19 0835  BP:   (!) 142/93 (!) 147/95  Pulse:   88 90  Resp:    15  Temp: 98.1 F (36.7 C)  98.5 F (36.9 C) 98 F (36.7 C)  TempSrc:    Oral  SpO2:   95% 94%  Weight:  110.8 kg      Wt Readings from Last 3 Encounters:  11/18/19 110.8 kg  11/10/19 112.5 kg  10/11/19 106.7 kg     Intake/Output Summary (Last 24 hours) at 11/18/2019 1333 Last data filed at 11/18/2019 0849 Gross per 24 hour  Intake 638.01 ml  Output --  Net 638.01 ml     Physical Exam Gen Exam:Alert awake-not in any distress HEENT:atraumatic, normocephalic Chest: B/L clear to auscultation anteriorly CVS:S1S2 regular Abdomen:soft non tender, non distended Extremities:no edema Neurology: Non focal Skin: no rash   Data Review:    CBC Recent Labs  Lab 11/16/19 0401 11/16/19 0900 11/17/19 0254 11/18/19 0556  WBC 4.6 8.0 6.4 5.5  HGB 4.8* 6.1* 7.6* 7.4*  HCT 15.9* 20.0* 24.8* 23.1*  PLT 125* 137* 149* 172  MCV 94.6 93.9 92.5 90.2  MCH 28.6 28.6  28.4 28.9  MCHC 30.2 30.5 30.6 32.0  RDW 18.4* 17.8* 17.8* 16.7*  LYMPHSABS 0.7  --   --   --   MONOABS 0.3  --   --   --   EOSABS 0.0  --   --   --   BASOSABS 0.0  --   --   --     Chemistries  Recent Labs  Lab 11/16/19 0700 11/16/19 2013 11/17/19 0023 11/17/19 0254 11/17/19 0426 11/17/19 0426 11/17/19 1402 11/17/19 1800 11/17/19 2356 11/18/19 0556 11/18/19 1209  NA 140   < >   < >  --  137  --   --  137 136 137 136  K >7.5*   < >   < > 6.5* 6.5*   < > 4.2 4.9 4.8 4.5 4.7  CL 89*   < >   < >  --  89*  --   --  91* 93* 92* 91*  CO2 20*   < >   < >  --  22  --   --  24 24 24 23   GLUCOSE 81   < >   < >  --  93  --   --  151* 148* 118*  141*  BUN 190*   < >   < >  --  122*  --   --  73* 77* 79* 79*  CREATININE >50.00*   < >   < >  --  36.63*  --   --  25.15* 26.17* 26.67* PENDING  CALCIUM 9.0   < >   < >  --  8.5*  --   --  8.4* 8.0* 7.9* 8.1*  MG 4.1*  --   --  3.3*  --   --   --   --   --  2.5*  --   AST 403*  --   --   --   --   --   --   --   --  87*  --   ALT 303*  --   --   --   --   --   --   --   --  150*  --   ALKPHOS 83  --   --   --   --   --   --   --   --  57  --   BILITOT 2.5*  --   --   --   --   --   --   --   --  1.2  --    < > = values in this interval not displayed.   ------------------------------------------------------------------------------------------------------------------ No results for input(s): CHOL, HDL, LDLCALC, TRIG, CHOLHDL, LDLDIRECT in the last 72 hours.  Lab Results  Component Value Date   HGBA1C 4.7 06/10/2019   ------------------------------------------------------------------------------------------------------------------ No results for input(s): TSH, T4TOTAL, T3FREE, THYROIDAB in the last 72 hours.  Invalid input(s): FREET3 ------------------------------------------------------------------------------------------------------------------ Recent Labs    11/17/19 0254 11/18/19 0556  FERRITIN >7,500* 4,744*    Coagulation  profile No results for input(s): INR, PROTIME in the last 168 hours.  Recent Labs    11/17/19 0254 11/18/19 0556  DDIMER 6.53* 5.04*    Cardiac Enzymes No results for input(s): CKMB, TROPONINI, MYOGLOBIN in the last 168 hours.  Invalid input(s): CK ------------------------------------------------------------------------------------------------------------------    Component Value Date/Time   BNP 1,812.7 (H) 03/01/2017 0405    Micro Results Recent Results (from the past 240 hour(s))  Respiratory Panel by RT PCR (Flu A&B, Covid) - Nasopharyngeal Swab     Status: Abnormal   Collection Time: 11/16/19  4:01 AM   Specimen: Nasopharyngeal Swab  Result Value Ref Range Status   SARS Coronavirus 2 by RT PCR POSITIVE (A) NEGATIVE Final    Comment: RESULT CALLED TO, READ BACK BY AND VERIFIED WITH: J. GLOSTER,RN 0500 11/16/2019 T. TYSOR (NOTE) SARS-CoV-2 target nucleic acids are DETECTED. SARS-CoV-2 RNA is generally detectable in upper respiratory specimens  during the acute phase of infection. Positive results are indicative of the presence of the identified virus, but do not rule out bacterial infection or co-infection with other pathogens not detected by the test. Clinical correlation with patient history and other diagnostic information is necessary to determine patient infection status. The expected result is Negative. Fact Sheet for Patients:  PinkCheek.be Fact Sheet for Healthcare Providers: GravelBags.it This test is not yet approved or cleared by the [place] FDA and  has been authorized for detection and/or diagnosis of SARS-CoV-2 by FDA under an Emergency Use Authorization (EUA).  This EUA will remain in effect (meaning this test can be used ) for the duration of  the COVID-19 declaration under Section 564(b)(1) of the Act, 21 U.S.C. section 360bbb-3(b)(1), unless the  authorization is terminated or revoked  sooner.    Influenza A by PCR NEGATIVE NEGATIVE Final   Influenza B by PCR NEGATIVE NEGATIVE Final    Comment: (NOTE) The Xpert Xpress SARS-CoV-2/FLU/RSV assay is intended as an aid in  the diagnosis of influenza from Nasopharyngeal swab specimens and  should not be used as a sole basis for treatment. Nasal washings and  aspirates are unacceptable for Xpert Xpress SARS-CoV-2/FLU/RSV  testing. Fact Sheet for Patients: PinkCheek.be Fact Sheet for Healthcare Providers: GravelBags.it This test is not yet approved or cleared by the [place] FDA and  has been authorized for detection and/or diagnosis of SARS-CoV-2 by  FDA under an Emergency Use Authorization (EUA). This EUA will remain  in effect (meaning this test can be used) for the duration of the  Covid-19 declaration under Section 564(b)(1) of the Act, 21  U.S.C. section 360bbb-3(b)(1), unless the authorization is  terminated or revoked. Performed at [facility], [place] [address] [place], [place] [address]   MRSA PCR Screening     Status: None   Collection Time: 11/16/19  8:00 AM   Specimen: Nasal Mucosa; Nasopharyngeal  Result Value Ref Range Status   MRSA by PCR NEGATIVE NEGATIVE Final    Comment:        The GeneXpert MRSA Assay (FDA approved for NASAL specimens only), is one component of a comprehensive MRSA colonization surveillance program. It is not intended to diagnose MRSA infection nor to guide or monitor treatment for MRSA infections. Performed at [facility], [place] [address] [place], [place] [address]     Radiology Reports DG Abdomen 1 View  Result Date: 11/16/2019 CLINICAL DATA:  NG tube placement. EXAM: PORTABLE CHEST 1 VIEW COMPARISON:  One-view chest x-ray 11/16/2019 FINDINGS: Side port of the NG tube is in the stomach. Bowel gas pattern is unremarkable. IMPRESSION: Side port of the NG tube is in the stomach. Electronically Signed    By: San Morelle M.D.   On: 11/16/2019 04:54   DG Chest Port 1 View  Result Date: 11/16/2019 CLINICAL DATA:  NG tube placement. EXAM: PORTABLE CHEST 1 VIEW COMPARISON:  One-view chest x-ray 11/16/2019 FINDINGS: Side port of the NG tube is in the stomach. Bowel gas pattern is unremarkable. IMPRESSION: Side port of the NG tube is in the stomach. Electronically Signed   By: San Morelle M.D.   On: 11/16/2019 04:54

## 2019-11-18 NOTE — Progress Notes (Signed)
Patient refused to be transported to IR for procedure.  He stated, "I want to postpone it until I talk to my wife."  Patient attempted to contact wife on several occasions, but was unsuccessful.  Dr. Joelyn Oms the nephrologist made aware of patient's refusal.  Dr. Sloan Leiter also made aware.  Will continue to monitor patient.

## 2019-11-18 NOTE — Consult Note (Signed)
   [facility] Inpatient Consult   11/18/2019  Patrick Ibarra 03-23-1981 [id number]   Patient screened for high risk score for unplanned readmission and for 3 hospitalizations in the past 6 months.  Chart reviewed to check if potential [place] Management services are needed. Patient is in the roster with [employer] with [facility] and benefits for post hospital care management follow up for support and disease management.  Review of patient's medical record reveals patient is admitted with ESRD, COVID positive, admitted with AMS (altered mental status).  Primary Care Provider is Karle Plumber, MD with [facility].  Pharmacy is:  [place] [facility].  Transportation to provider: self  Plan:  Follow up with inpatient [facility] team to follow up on needs and introduce eligibility of [place] Management for support needs. Continue to follow progress and disposition to assess for post hospital care management needs.   Please place a [facility] Care Management consult as appropriate and for questions contact:   Natividad Brood, RN BSN [facility] Liaison  [phone] business mobile phone Toll free office [phone]  Fax number: [phone] [email or web address]

## 2019-11-18 NOTE — Plan of Care (Signed)
  Problem: Clinical Measurements: Goal: Respiratory complications will improve Outcome: Progressing   

## 2019-11-19 ENCOUNTER — Encounter (HOSPITAL_COMMUNITY): Payer: Medicare Other

## 2019-11-19 LAB — COMPREHENSIVE METABOLIC PANEL
ALT: 115 U/L — ABNORMAL HIGH (ref 0–44)
AST: 54 U/L — ABNORMAL HIGH (ref 15–41)
Albumin: 2.5 g/dL — ABNORMAL LOW (ref 3.5–5.0)
Alkaline Phosphatase: 59 U/L (ref 38–126)
Anion gap: 22 — ABNORMAL HIGH (ref 5–15)
BUN: 83 mg/dL — ABNORMAL HIGH (ref 6–20)
CO2: 21 mmol/L — ABNORMAL LOW (ref 22–32)
Calcium: 7.9 mg/dL — ABNORMAL LOW (ref 8.9–10.3)
Chloride: 91 mmol/L — ABNORMAL LOW (ref 98–111)
Creatinine, Ser: 27.25 mg/dL — ABNORMAL HIGH (ref 0.61–1.24)
GFR calc Af Amer: 2 mL/min — ABNORMAL LOW (ref >60)
GFR calc non Af Amer: 2 mL/min — ABNORMAL LOW (ref >60)
Glucose, Bld: 163 mg/dL — ABNORMAL HIGH (ref 70–99)
Potassium: 4.4 mmol/L (ref 3.5–5.1)
Sodium: 134 mmol/L — ABNORMAL LOW (ref 135–145)
Total Bilirubin: 1.1 mg/dL (ref 0.3–1.2)
Total Protein: 6.3 g/dL — ABNORMAL LOW (ref 6.5–8.1)

## 2019-11-19 LAB — CBC
HCT: 23.9 % — ABNORMAL LOW (ref 39.0–52.0)
Hemoglobin: 7.5 g/dL — ABNORMAL LOW (ref 13.0–17.0)
MCH: 29 pg (ref 26.0–34.0)
MCHC: 31.4 g/dL (ref 30.0–36.0)
MCV: 92.3 fL (ref 80.0–100.0)
Platelets: 222 10*3/uL (ref 150–400)
RBC: 2.59 MIL/uL — ABNORMAL LOW (ref 4.22–5.81)
RDW: 16.6 % — ABNORMAL HIGH (ref 11.5–15.5)
WBC: 8.6 10*3/uL (ref 4.0–10.5)
nRBC: 0.2 % (ref 0.0–0.2)

## 2019-11-19 LAB — GLUCOSE, CAPILLARY
Glucose-Capillary: 133 mg/dL — ABNORMAL HIGH (ref 70–99)
Glucose-Capillary: 123 mg/dL — ABNORMAL HIGH (ref 70–99)
Glucose-Capillary: 168 mg/dL — ABNORMAL HIGH (ref 70–99)
Glucose-Capillary: 192 mg/dL — ABNORMAL HIGH (ref 70–99)
Glucose-Capillary: 187 mg/dL — ABNORMAL HIGH (ref 70–99)

## 2019-11-19 LAB — PHOSPHORUS: Phosphorus: 9.3 mg/dL — ABNORMAL HIGH (ref 2.5–4.6)

## 2019-11-19 LAB — MAGNESIUM: Magnesium: 2.4 mg/dL (ref 1.7–2.4)

## 2019-11-19 LAB — C-REACTIVE PROTEIN: CRP: 7.7 mg/dL — ABNORMAL HIGH (ref <1.0)

## 2019-11-19 LAB — D-DIMER, QUANTITATIVE: D-Dimer, Quant: 3.95 ug/mL-FEU — ABNORMAL HIGH (ref 0.00–0.50)

## 2019-11-19 LAB — FERRITIN: Ferritin: 2949 ng/mL — ABNORMAL HIGH (ref 24–336)

## 2019-11-19 MED ORDER — CHLORHEXIDINE GLUCONATE CLOTH 2 % EX PADS: 6.0000 | MEDICATED_PAD | Freq: Every day | CUTANEOUS | Status: DC

## 2019-11-19 MED ORDER — HEPARIN SODIUM (PORCINE) 1000 UNIT/ML IJ SOLN
INTRAMUSCULAR | Status: AC
  Filled 2019-11-19: qty 4

## 2019-11-19 NOTE — Progress Notes (Signed)
PROGRESS NOTE                                                                                                                                                                                                             Patient Demographics:    Patrick Ibarra, is a 39 y.o. male, DOB - 1981-07-04, UXL:[id number]  Outpatient Primary MD for the patient is Ladell Pier, MD   Admit date - 11/16/2019   LOS - 3  Chief Complaint  Patient presents with  . Altered Mental Status       Brief Narrative: Patient is a 39 y.o. male with PMHx of-used to be on hemodialysis-but elected to stop hemodialysis last month-presented with acute uremic encephalopathy, hyperkalemia acute hypoxic respiratory failure secondary to volume overload, severe anemia, was found to be Covid positive-admitted to the [facility]-transfer to the [facility].  See below for further details.  Significant Events: 1/29-2/2: Admit for hypoxic respiratory failure, hyperkalemia, metabolic acidosis-requiring emergent HD-left AMA-did not want any further HD-as patient wanted to pursue holistic therapy.  Last HD on 2/1.  3/16>> admit to [facility] ICU-volume overload, hyperkalemia, bradycardia with sine wave, hemoglobin of 4.8 and acute hypoxic respiratory failure secondary to pulmonary pneumonia.    Microbiology data: 3/16>> COVID-19 positive  COVID-19 medications Remdesivir: 3/15>>3/19 Decadron: 3/17>>  DVT prophylaxis: Subcutaneous heparin  Procedures: 3/16>> right femoral paralysis catheter  Consults: PCCM, nephrology   Subjective:   No major issues-denies any chest pain or shortness of breath.  Still has not made up his mind whether he wants to pursue tunneled hemodialysis catheter or even a AV fistula.  Instead is inquiring about getting dialysis at home.   Assessment  & Plan :   Acute Hypoxic Resp Failure due to pulmonary edema  in the setting of volume overload due to missed HD: Improved-with HD-on room air  COVID-19 pneumonia: Remains on room air-will complete remdesivir today.  Since on room air-does not require steroids.    Fever: afebrile  O2 requirements:  SpO2: (!) 89 % O2 Flow Rate (L/min): 2 L/min   COVID-19 Labs: Recent Labs    11/17/19 0254 11/18/19 0556 11/19/19 0227  DDIMER 6.53* 5.04* 3.95*  FERRITIN >7,500* 4,744* 2,949*  CRP 13.9* 9.9* 7.7*       Component Value Date/Time   BNP 1,812.7 (H) 03/01/2017  0405    No results for input(s): PROCALCITON in the last 168 hours.  Lab Results  Component Value Date   [place] (A) 11/16/2019   [place] NEGATIVE 10/01/2019   [place] NEGATIVE 09/26/2019   [place] NEGATIVE 09/25/2019     Prone/Incentive Spirometry: encouraged  incentive spirometry use 3-4/hour.  Severe hyperkalemia: Resolved-after emergent HD.  ESRD: Refused HD last admit-and favor a more holistic approach and subsequently signed out AMA.  Unfortunately-did not go to get [facility] yesterday-he still is debating whether or not to have a tunneled dialysis catheter or a AV fistula placed.  I have counseled him at length regarding importance of compliance to dialysis-he is already had a life-threatening event this admission.  He is aware of the life-threatening and life disabling effects of missing hemodialysis.  I also counseled him that the dialysis catheter that he has in his groin is temporary-is prone to get infected-and hence ideally we need to place a tunneled dialysis catheter so we can remove the temporary catheter before infection/complication setting.  Note-patient is completely awake and alert-answers all questions appropriately-is very coherent-seems to understand the risks of not having dialysis-has the capacity to make decisions for himself.  Transaminitis: Likely from Covid pneumonitis (congestion in the setting of volume overload.  Improving slowly.   Follow.   Elevated D-dimer: Not hypoxic-doubt VTE-but given COVID-19-Dopplers pending  Severe anemia: Secondary to noncompliant with HD-missing Aranesp injections with hemodialysis-2 units of PRBC on 3/16-hemoglobin relatively stable.  No indication of any blood loss at this point.  Continue Aranesp per nephrology.  Hypertension: Controlled-continue with amlodipine, Coreg  DM-2: Change to SSI-sensitive scale and follow CBGs.  Recent Labs    11/19/19 0737 11/19/19 1127 11/19/19 1251  GLUCAP 133* 123* 168*   Obesity: Estimated body mass index is 32.23 kg/m as calculated from the following:   Height as of 11/10/19: 6\' 1"  (1.854 m).   Weight as of this encounter: 110.8 kg.    ABG:    Component Value Date/Time   PHART 7.284 (L) 10/01/2019 1031   PCO2ART 40.9 10/01/2019 1031   PO2ART 70.0 (L) 10/01/2019 1031   HCO3 19.4 (L) 10/01/2019 1031   TCO2 21 (L) 10/01/2019 1031   ACIDBASEDEF 7.0 (H) 10/01/2019 1031   O2SAT 92.0 10/01/2019 1031    Vent Settings: N/A  Condition - Extremely Guarded-  Family Communication  :  Called spouse-getting busy ring tone 3/19  Code Status :  Full Code  Diet :  Diet Order            Diet renal/carb modified with fluid restriction Diet-HS Snack? Nothing; Fluid restriction: 1200 mL Fluid; Room service appropriate? Yes; Fluid consistency: Thin  Diet effective now               Disposition Plan  :  Remain hospitalized  Barriers to discharge:Complete 5 days of IV Remdesivir  Antimicorbials  :    Anti-infectives (From admission, onward)   Start     Dose/Rate Route Frequency Ordered Stop   11/18/19 1555  ceFAZolin (ANCEF) 2-4 GM/100ML-% IVPB  Status:  Discontinued    Note to Pharmacy: Fredric Dine   : cabinet override      11/18/19 1555 11/18/19 1628   11/17/19 1000  remdesivir 100 mg in sodium chloride 0.9 % 100 mL IVPB  Status:  Discontinued     100 mg 200 mL/hr over 30 Minutes Intravenous Daily 11/16/19 0557 11/16/19 0557    11/17/19 1000  remdesivir 100 mg in sodium chloride  0.9 % 100 mL IVPB     100 mg 200 mL/hr over 30 Minutes Intravenous Daily 11/16/19 0558 11/21/19 0959   11/16/19 0630  remdesivir 100 mg in sodium chloride 0.9 % 100 mL IVPB     100 mg 200 mL/hr over 30 Minutes Intravenous Every 30 min 11/16/19 0558 11/16/19 0844   11/16/19 0600  remdesivir 200 mg in sodium chloride 0.9% 250 mL IVPB  Status:  Discontinued     200 mg 580 mL/hr over 30 Minutes Intravenous Once 11/16/19 0557 11/16/19 0557      Inpatient Medications  Scheduled Meds: . amLODipine  10 mg Oral Daily  . carvedilol  12.5 mg Oral BID  . Chlorhexidine Gluconate Cloth  6 each Topical Daily  . [START ON 11/23/2019] darbepoetin (ARANESP) injection - DIALYSIS  150 mcg Intravenous Q Tue-HD  . dexamethasone (DECADRON) injection  6 mg Intravenous Daily  . heparin injection (subcutaneous)  5,000 Units Subcutaneous Q8H  . insulin aspart  0-5 Units Subcutaneous QHS  . insulin aspart  0-9 Units Subcutaneous TID WC   Continuous Infusions: . dextrose 50 mL/hr at 11/19/19 0913  . remdesivir 100 mg in NS 100 mL 100 mg (11/19/19 1119)   PRN Meds:.acetaminophen, hydrALAZINE, labetalol, ondansetron (ZOFRAN) IV   Time Spent in minutes  25  See all Orders from today for further details   Oren Binet M.D on 11/19/2019 at 1:50 PM  To page go to [email or web address] - use universal password  [facility] -  Office  [phone]    Objective:   Vitals:   11/18/19 2123 11/18/19 2124 11/19/19 0649 11/19/19 0909  BP: (!) 149/104 (!) 156/97 (!) 150/97 (!) 156/101  Pulse: 98 98 90   Resp:      Temp: 99.3 F (37.4 C)  98.5 F (36.9 C)   TempSrc: Oral  Oral   SpO2: 91% 92% (!) 89%   Weight:        Wt Readings from Last 3 Encounters:  11/18/19 110.8 kg  11/10/19 112.5 kg  10/11/19 106.7 kg     Intake/Output Summary (Last 24 hours) at 11/19/2019 1350 Last data filed at 11/18/2019 1735 Gross per 24 hour  Intake 1350.85 ml    Output --  Net 1350.85 ml     Physical Exam Gen Exam:Alert awake-not in any distress HEENT:atraumatic, normocephalic Chest: B/L clear to auscultation anteriorly CVS:S1S2 regular Abdomen:soft non tender, non distended Extremities:no edema Neurology: Non focal Skin: no rash   Data Review:    CBC Recent Labs  Lab 11/16/19 0401 11/16/19 0900 11/17/19 0254 11/18/19 0556 11/19/19 0227  WBC 4.6 8.0 6.4 5.5 8.6  HGB 4.8* 6.1* 7.6* 7.4* 7.5*  HCT 15.9* 20.0* 24.8* 23.1* 23.9*  PLT 125* 137* 149* 172 222  MCV 94.6 93.9 92.5 90.2 92.3  MCH 28.6 28.6 28.4 28.9 29.0  MCHC 30.2 30.5 30.6 32.0 31.4  RDW 18.4* 17.8* 17.8* 16.7* 16.6*  LYMPHSABS 0.7  --   --   --   --   MONOABS 0.3  --   --   --   --   EOSABS 0.0  --   --   --   --   BASOSABS 0.0  --   --   --   --     Chemistries  Recent Labs  Lab 11/16/19 0700 11/16/19 2013 11/17/19 0254 11/17/19 0426 11/17/19 1800 11/17/19 2356 11/18/19 0556 11/18/19 1209 11/19/19 0227  NA 140   < >  --    < >  137 136 137 136 134*  K >7.5*   < > 6.5*   < > 4.9 4.8 4.5 4.7 4.4  CL 89*   < >  --    < > 91* 93* 92* 91* 91*  CO2 20*   < >  --    < > 24 24 24 23  21*  GLUCOSE 81   < >  --    < > 151* 148* 118* 141* 163*  BUN 190*   < >  --    < > 73* 77* 79* 79* 83*  CREATININE >50.00*   < >  --    < > 25.15* 26.17* 26.67* 26.69* 27.25*  CALCIUM 9.0   < >  --    < > 8.4* 8.0* 7.9* 8.1* 7.9*  MG 4.1*  --  3.3*  --   --   --  2.5*  --  2.4  AST 403*  --   --   --   --   --  87*  --  54*  ALT 303*  --   --   --   --   --  150*  --  115*  ALKPHOS 83  --   --   --   --   --  57  --  59  BILITOT 2.5*  --   --   --   --   --  1.2  --  1.1   < > = values in this interval not displayed.   ------------------------------------------------------------------------------------------------------------------ No results for input(s): CHOL, HDL, LDLCALC, TRIG, CHOLHDL, LDLDIRECT in the last 72 hours.  Lab Results  Component Value Date   HGBA1C 4.7  06/10/2019   ------------------------------------------------------------------------------------------------------------------ No results for input(s): TSH, T4TOTAL, T3FREE, THYROIDAB in the last 72 hours.  Invalid input(s): FREET3 ------------------------------------------------------------------------------------------------------------------ Recent Labs    11/18/19 0556 11/19/19 0227  FERRITIN 4,744* 2,949*    Coagulation profile No results for input(s): INR, PROTIME in the last 168 hours.  Recent Labs    11/18/19 0556 11/19/19 0227  DDIMER 5.04* 3.95*    Cardiac Enzymes No results for input(s): CKMB, TROPONINI, MYOGLOBIN in the last 168 hours.  Invalid input(s): CK ------------------------------------------------------------------------------------------------------------------    Component Value Date/Time   BNP 1,812.7 (H) 03/01/2017 0405    Micro Results Recent Results (from the past 240 hour(s))  Respiratory Panel by RT PCR (Flu A&B, Covid) - Nasopharyngeal Swab     Status: Abnormal   Collection Time: 11/16/19  4:01 AM   Specimen: Nasopharyngeal Swab  Result Value Ref Range Status   SARS Coronavirus 2 by RT PCR POSITIVE (A) NEGATIVE Final    Comment: RESULT CALLED TO, READ BACK BY AND VERIFIED WITH: J. GLOSTER,RN 0500 11/16/2019 T. TYSOR (NOTE) SARS-CoV-2 target nucleic acids are DETECTED. SARS-CoV-2 RNA is generally detectable in upper respiratory specimens  during the acute phase of infection. Positive results are indicative of the presence of the identified virus, but do not rule out bacterial infection or co-infection with other pathogens not detected by the test. Clinical correlation with patient history and other diagnostic information is necessary to determine patient infection status. The expected result is Negative. Fact Sheet for Patients:  PinkCheek.be Fact Sheet for Healthcare  Providers: GravelBags.it This test is not yet approved or cleared by the [place] FDA and  has been authorized for detection and/or diagnosis of SARS-CoV-2 by FDA under an Emergency Use Authorization (EUA).  This EUA will remain in effect (meaning this test can be used )  for the duration of  the COVID-19 declaration under Section 564(b)(1) of the Act, 21 U.S.C. section 360bbb-3(b)(1), unless the authorization is terminated or revoked sooner.    Influenza A by PCR NEGATIVE NEGATIVE Final   Influenza B by PCR NEGATIVE NEGATIVE Final    Comment: (NOTE) The Xpert Xpress SARS-CoV-2/FLU/RSV assay is intended as an aid in  the diagnosis of influenza from Nasopharyngeal swab specimens and  should not be used as a sole basis for treatment. Nasal washings and  aspirates are unacceptable for Xpert Xpress SARS-CoV-2/FLU/RSV  testing. Fact Sheet for Patients: PinkCheek.be Fact Sheet for Healthcare Providers: GravelBags.it This test is not yet approved or cleared by the [place] FDA and  has been authorized for detection and/or diagnosis of SARS-CoV-2 by  FDA under an Emergency Use Authorization (EUA). This EUA will remain  in effect (meaning this test can be used) for the duration of the  Covid-19 declaration under Section 564(b)(1) of the Act, 21  U.S.C. section 360bbb-3(b)(1), unless the authorization is  terminated or revoked. Performed at [facility], [place] [address], [place], [place] [address]   MRSA PCR Screening     Status: None   Collection Time: 11/16/19  8:00 AM   Specimen: Nasal Mucosa; Nasopharyngeal  Result Value Ref Range Status   MRSA by PCR NEGATIVE NEGATIVE Final    Comment:        The GeneXpert MRSA Assay (FDA approved for NASAL specimens only), is one component of a comprehensive MRSA colonization surveillance program. It is not intended to diagnose MRSA infection nor  to guide or monitor treatment for MRSA infections. Performed at [facility], [place] [address], [place],  [address]     Radiology Reports DG Abdomen 1 View  Result Date: 11/16/2019 CLINICAL DATA:  NG tube placement. EXAM: PORTABLE CHEST 1 VIEW COMPARISON:  One-view chest x-ray 11/16/2019 FINDINGS: Side port of the NG tube is in the stomach. Bowel gas pattern is unremarkable. IMPRESSION: Side port of the NG tube is in the stomach. Electronically Signed   By: San Morelle M.D.   On: 11/16/2019 04:54   DG Chest Port 1 View  Result Date: 11/16/2019 CLINICAL DATA:  NG tube placement. EXAM: PORTABLE CHEST 1 VIEW COMPARISON:  One-view chest x-ray 11/16/2019 FINDINGS: Side port of the NG tube is in the stomach. Bowel gas pattern is unremarkable. IMPRESSION: Side port of the NG tube is in the stomach. Electronically Signed   By: San Morelle M.D.   On: 11/16/2019 04:54

## 2019-11-19 NOTE — Progress Notes (Signed)
Pt states he does not use one.

## 2019-11-19 NOTE — Progress Notes (Signed)
Admit: 11/16/2019 LOS: 3  36M ESRD stopped HD x6wk presented with severe hyperkalemia, anemia, COVID19  Subjective: no new c/o's, legs still swollen  03/18 0701 - 03/19 0700 In: 1450.9 [P.O.:240; I.V.:1110.9; IV Piggyback:100] Out: -   Filed Weights   11/16/19 0416 11/17/19 0423 11/18/19 0500  Weight: 112 kg 112.6 kg 110.8 kg    Scheduled Meds: . amLODipine  10 mg Oral Daily  . carvedilol  12.5 mg Oral BID  . Chlorhexidine Gluconate Cloth  6 each Topical Daily  . Chlorhexidine Gluconate Cloth  6 each Topical Q0600  . [START ON 11/23/2019] darbepoetin (ARANESP) injection - DIALYSIS  150 mcg Intravenous Q Tue-HD  . dexamethasone (DECADRON) injection  6 mg Intravenous Daily  . heparin injection (subcutaneous)  5,000 Units Subcutaneous Q8H  . insulin aspart  0-5 Units Subcutaneous QHS  . insulin aspart  0-9 Units Subcutaneous TID WC   Continuous Infusions: . dextrose 50 mL/hr at 11/19/19 0913  . remdesivir 100 mg in NS 100 mL 100 mg (11/19/19 1119)   PRN Meds:.acetaminophen, hydrALAZINE, labetalol, ondansetron (ZOFRAN) IV  Current Labs: reviewed    Physical Exam:  Blood pressure (!) 156/101, pulse 90, temperature 98.5 F (36.9 C), temperature source Oral, resp. rate (!) 21, weight 110.8 kg, SpO2 (!) 89 %. NAD RRR nl s1s2 no rub CTAB nl wob 1-2+ LE edema Femoral Temp HD cath bandaged  A 1. Severe hyperkalemia/+ EKG changes - s/p med stabilization+ emergent HD 3/16, resolved 2. ESRD -  no HD for 6 wks; long term plan TBD moving forward; agrees to HD in hospital 3. No vascular access- s/p temp femoral cath 11/16/19 CCM 4. COVID19+ remdisiver/dexamethason 5. Uremia, azotemia- improving with HD 6. Severe anemia of CKD - Hb 4.8; transfued, 7.4 this AM; high ferritin 2/2 #4; on ESA 7. Metabolic acidosis- resolved 8. CKD-BMD- uncontrolled, hyperphosphatemic and noncompliant 9. HTN/Vol- remains hypervolemic  P . HD today and tomorrow . He as of now won't commit to cont HD  when leaving hospital . Not consenting to [facility] as this time, or AVG  Kelly Splinter, MD 11/19/2019, 11:57 AM     Recent Labs  Lab 11/17/19 0254 11/17/19 0426 11/18/19 0556 11/18/19 1209 11/19/19 0227  NA  --    < > 137 136 134*  K 6.5*   < > 4.5 4.7 4.4  CL  --    < > 92* 91* 91*  CO2  --    < > 24 23 21*  GLUCOSE  --    < > 118* 141* 163*  BUN  --    < > 79* 79* 83*  CREATININE  --    < > 26.67* 26.69* 27.25*  CALCIUM  --    < > 7.9* 8.1* 7.9*  PHOS 12.6*  --  9.5*  --  9.3*   < > = values in this interval not displayed.   Recent Labs  Lab 11/16/19 0401 11/16/19 0900 11/17/19 0254 11/18/19 0556 11/19/19 0227  WBC 4.6   < > 6.4 5.5 8.6  NEUTROABS 3.5  --   --   --   --   HGB 4.8*   < > 7.6* 7.4* 7.5*  HCT 15.9*   < > 24.8* 23.1* 23.9*  MCV 94.6   < > 92.5 90.2 92.3  PLT 125*   < > 149* 172 222   < > = values in this interval not displayed.

## 2019-11-20 ENCOUNTER — Encounter (HOSPITAL_COMMUNITY): Payer: Medicare Other

## 2019-11-20 DIAGNOSIS — N19 Unspecified kidney failure: Secondary | ICD-10-CM

## 2019-11-20 DIAGNOSIS — Z515 Encounter for palliative care: Secondary | ICD-10-CM

## 2019-11-20 DIAGNOSIS — Z7189 Other specified counseling: Secondary | ICD-10-CM

## 2019-11-20 LAB — GLUCOSE, CAPILLARY
Glucose-Capillary: 137 mg/dL — ABNORMAL HIGH (ref 70–99)
Glucose-Capillary: 154 mg/dL — ABNORMAL HIGH (ref 70–99)

## 2019-11-20 LAB — PHOSPHORUS
Phosphorus: 6.3 mg/dL — ABNORMAL HIGH (ref 2.5–4.6)
Phosphorus: 6.9 mg/dL — ABNORMAL HIGH (ref 2.5–4.6)

## 2019-11-20 LAB — CBC
HCT: 25.3 % — ABNORMAL LOW (ref 39.0–52.0)
Hemoglobin: 8 g/dL — ABNORMAL LOW (ref 13.0–17.0)
MCH: 28.9 pg (ref 26.0–34.0)
MCHC: 31.6 g/dL (ref 30.0–36.0)
MCV: 91.3 fL (ref 80.0–100.0)
Platelets: 276 10*3/uL (ref 150–400)
RBC: 2.77 MIL/uL — ABNORMAL LOW (ref 4.22–5.81)
RDW: 16.3 % — ABNORMAL HIGH (ref 11.5–15.5)
WBC: 10.3 10*3/uL (ref 4.0–10.5)
nRBC: 0.2 % (ref 0.0–0.2)

## 2019-11-20 LAB — MAGNESIUM: Magnesium: 1.9 mg/dL (ref 1.7–2.4)

## 2019-11-20 LAB — COMPREHENSIVE METABOLIC PANEL
ALT: 74 U/L — ABNORMAL HIGH (ref 0–44)
AST: 33 U/L (ref 15–41)
Albumin: 2.3 g/dL — ABNORMAL LOW (ref 3.5–5.0)
Alkaline Phosphatase: 56 U/L (ref 38–126)
Anion gap: 17 — ABNORMAL HIGH (ref 5–15)
BUN: 44 mg/dL — ABNORMAL HIGH (ref 6–20)
CO2: 24 mmol/L (ref 22–32)
Calcium: 8 mg/dL — ABNORMAL LOW (ref 8.9–10.3)
Chloride: 95 mmol/L — ABNORMAL LOW (ref 98–111)
Creatinine, Ser: 16.73 mg/dL — ABNORMAL HIGH (ref 0.61–1.24)
GFR calc Af Amer: 4 mL/min — ABNORMAL LOW (ref >60)
GFR calc non Af Amer: 3 mL/min — ABNORMAL LOW (ref >60)
Glucose, Bld: 137 mg/dL — ABNORMAL HIGH (ref 70–99)
Potassium: 3.8 mmol/L (ref 3.5–5.1)
Sodium: 136 mmol/L (ref 135–145)
Total Bilirubin: 0.9 mg/dL (ref 0.3–1.2)
Total Protein: 5.8 g/dL — ABNORMAL LOW (ref 6.5–8.1)

## 2019-11-20 LAB — HEPATITIS B SURFACE ANTIBODY,QUALITATIVE: Hep B S Ab: REACTIVE — AB

## 2019-11-20 LAB — HEPATITIS B SURFACE ANTIGEN: Hepatitis B Surface Ag: NONREACTIVE

## 2019-11-20 LAB — C-REACTIVE PROTEIN: CRP: 6 mg/dL — ABNORMAL HIGH (ref <1.0)

## 2019-11-20 LAB — D-DIMER, QUANTITATIVE: D-Dimer, Quant: 6.4 ug/mL-FEU — ABNORMAL HIGH (ref 0.00–0.50)

## 2019-11-20 LAB — ALBUMIN: Albumin: 2.3 g/dL — ABNORMAL LOW (ref 3.5–5.0)

## 2019-11-20 LAB — FERRITIN: Ferritin: 2024 ng/mL — ABNORMAL HIGH (ref 24–336)

## 2019-11-20 MED ORDER — HEPARIN SODIUM (PORCINE) 1000 UNIT/ML IJ SOLN
3000.0000 [IU] | Freq: Once | INTRAMUSCULAR | Status: AC
  Administered 2019-11-20: 3000 [IU] via INTRAVENOUS

## 2019-11-20 MED ORDER — HEPARIN SODIUM (PORCINE) 1000 UNIT/ML IJ SOLN
3200.0000 [IU] | Freq: Once | INTRAMUSCULAR | Status: AC
  Administered 2019-11-20: 3200 [IU]

## 2019-11-20 NOTE — Consult Note (Addendum)
Consultation Note Date: 11/20/2019   Patient Name: Patrick Ibarra  DOB: [birth year]  MRN: [id number]  Age / Sex: 39 y.o., male  PCP: Ladell Pier, MD Referring Physician: Jonetta Osgood, MD  Reason for Consultation: Establishing goals of care  HPI/Patient Profile:  Patient is a 39 y.o. male with PMHx of-used to be on hemodialysis-but elected to stop hemodialysis last month-presented with acute uremic encephalopathy, hyperkalemia acute hypoxic respiratory failure secondary to volume overload, severe anemia, was found to be Covid positive-admitted to the [facility] clinical stability-transfer to the [facility] hospitalist service.  Patrick Ibarra is known to the Palliative care service as he was seen in July of 2018 and February of this year for ongoing conversations related to his kidney function. More recently he had been resistant to outpatient dialysis in the setting of needing long term access. He and his wife had decided that a more holistic approach would be favorable. It was explained to the both of them that in his case this would likely be unsuccessful and it was recommended to continue with the medical plan that was recommended by nephrology. Despite this the family expressed a "need" to try the alternatives first. He has now been re-hospitalized due to not having received HD for a six week period of time resulting in hyperkalemia requiring emergent dialysis.   Clinical Assessment and Goals of Care: I have reviewed medical records including EPIC notes, labs and imaging, received report from bedside RN, assessed the patient. Patrick Ibarra is resting in the bed in no distress, he is breathing on RA. On the monitor he is noted to be in NSR.   I met with Patrick Ibarra to further discuss diagnosis prognosis, GOC, EOL wishes, disposition and options.  I introduced Palliative Medicine as specialized medical care for people  living with serious illness. It focuses on providing relief from the symptoms and stress of a serious illness. The goal is to improve quality of life for both the patient and the family.  I asked Patrick Ibarra to share a little about himself with me. He has been married to his wife, Patrick Ibarra since 2012. Patrick Ibarra is eight years older than Patrick Ibarra and has two children a son and a daughter. Patrick Ibarra states that he considers his step children his children. Thy have also recently gains custody of Patrick Ibarra's nephew who is 80 years old. His step son goes to school in [place] and plays university football. His stepdaughter lives with him and has a two year old daughter herself. Patrick Ibarra works as a Artist. He is a man of nondenominational faith.  I asked him to provide a narrative of his kidney disease. He shares that he has had renal disease, or known about his renal disease since 2018. He previously was on hemodialysis for a year and a half. He experienced complications with his left arm AVF requiring two surgical interventions. The last incident involved bleeding from his AVF requiring immediate surgical intervention. After this he and his wife made the decision to choose a more  alternative holistic path of care. Patrick Ibarra is not seeing an [facility] physician and is treating his disease through self research. He shares that his Doristine Bosworth had recommended an [facility] specialist but they were not taking new patients in the setting of COVID. He describes his most recent occurrence leading to hospitalization as "my whole body locked up, like I was paralyzed". We discussed the electrolytes in his body and talked extensively about potassium and the reality that if this level is too high it can and will cause a cardiac arrest. He shares that this is information he is aware of. I asked him what his plan is if this happens. He said that he would want CPR and imminent dialysis. I shared with him that if his heart stopped the  medical teams might not be able to get him resuscitated which would mean that he would be dead. He does seem aware of these risks.   Bertis feels that with close nephrology follow up and frequent laboratory draws he can manage his disease without hemodialysis. He said that he has a plan in place and now knows he will require closer monitoring of his pelectrolytes. He does realize that despite whatever holistic approach he chooses he will likely not be able to continue on without dialysis. He said that he has not ruled dialysis out but with the complications he had with his AVF he wants to try any and everything before committing to long term dialysis.   Concepts specific to code status, artifical feeding and hydration, continued IV antibiotics and rehospitalization was had.  Jaycen is very clear that he would want to be FULL CODE. He would want all interventions offered if he were in a life threatening situation.He realizes that he was very close to death prior to coming in and is very aware that he could have gone into cardiac arrest.   I asked if an outpatient Palliative provider could follow with Patrick Ibarra, he says that he would welcome this as "more help is better than less."  Discussed with patient the importance of continued conversation with family and their  medical providers regarding overall plan of care and treatment options, ensuring decisions are within the context of the patients values and GOCs.  Decision Maker: Patient can make decisions for himself however if her were unable to he would rely on his wife, Patrick Ibarra to make decisions on his behalf.   SUMMARY OF RECOMMENDATIONS   Full Code  TOC - OP Palliative Follow Up  Chaplain Consult  TOC --> SW for kidney disease support grups  Code Status/Advance Care Planning:  Full code  Palliative Prophylaxis:   Eye Care and Oral Care  Additional Recommendations (Limitations, Scope, Preferences):  Full Scope  Treatment  Psycho-social/Spiritual:   Desire for further Chaplaincy support: Yes  Additional Recommendations: Disease process support  Prognosis:   Unable to determine  Discharge Planning: Home with Palliative Services     Primary Diagnoses: Present on Admission: . Renal failure . Chronic renal failure  I have reviewed the medical record, interviewed the patient and family, and examined the patient. The following aspects are pertinent.  Past Medical History:  Diagnosis Date  . Chronic kidney disease    dialysis M-W-F  . Diabetes mellitus without complication (HCC)    type 2, diet controlled  . Hypertension   . LV dysfunction   . Obesity    Social History   Socioeconomic History  . Marital status: Married    Spouse name: Not  on file  . Number of children: Not on file  . Years of education: Not on file  . Highest education level: Not on file  Occupational History  . Not on file  Tobacco Use  . Smoking status: Never Smoker  . Smokeless tobacco: Never Used  Substance and Sexual Activity  . Alcohol use: Not Currently  . Drug use: No  . Sexual activity: Yes  Other Topics Concern  . Not on file  Social History Narrative   He is married with step kids. He works at [employer], [employer]/product.    Social Determinants of Health   Financial Resource Strain:   . Difficulty of Paying Living Expenses:   Food Insecurity:   . Worried About Charity fundraiser in the Last Year:   . Arboriculturist in the Last Year:   Transportation Needs:   . Film/video editor (Medical):   Marland Kitchen Lack of Transportation (Non-Medical):   Physical Activity:   . Days of Exercise per Week:   . Minutes of Exercise per Session:   Stress:   . Feeling of Stress :   Social Connections:   . Frequency of Communication with Friends and Family:   . Frequency of Social Gatherings with Friends and Family:   . Attends Religious Services:   . Active Member of Clubs or Organizations:   . Attends Theatre manager Meetings:   Marland Kitchen Marital Status:    Family History  Problem Relation Age of Onset  . Hypertension Mother   . Diabetes Mother   . Hypertension Father    Scheduled Meds: . amLODipine  10 mg Oral Daily  . carvedilol  12.5 mg Oral BID  . Chlorhexidine Gluconate Cloth  6 each Topical Daily  . [START ON 11/23/2019] darbepoetin (ARANESP) injection - DIALYSIS  150 mcg Intravenous Q Tue-HD  . heparin      . heparin injection (subcutaneous)  5,000 Units Subcutaneous Q8H  . insulin aspart  0-5 Units Subcutaneous QHS  . insulin aspart  0-9 Units Subcutaneous TID WC   Continuous Infusions: . dextrose 50 mL/hr at 11/19/19 0913  . remdesivir 100 mg in NS 100 mL 100 mg (11/19/19 1119)   PRN Meds:.acetaminophen, hydrALAZINE, labetalol, ondansetron (ZOFRAN) IV Medications Prior to Admission:  Prior to Admission medications   Medication Sig Start Date End Date Taking? Authorizing Provider  acetaminophen (TYLENOL) 500 MG tablet Take 500-1,000 mg by mouth every 6 (six) hours as needed for mild pain or headache.   Yes [provider]  amLODipine (NORVASC) 10 MG tablet Take 1 tablet (10 mg total) by mouth daily. 06/10/19  Yes Ladell Pier, MD  carvedilol (COREG) 12.5 MG tablet Take 1 tablet (12.5 mg total) by mouth 2 (two) times daily with a meal. 11/09/19  Yes Ladell Pier, MD  Ferrous Sulfate (FERROUSUL PO) Take 5 mLs by mouth daily after breakfast.   Yes [provider]  lovastatin (MEVACOR) 20 MG tablet Take 1 tablet (20 mg total) by mouth at bedtime. 06/12/19  Yes Ladell Pier, MD  Accu-Chek FastClix Lancets MISC Use as instructed to check blood sugar once daily. E11.9 06/17/19   Ladell Pier, MD  Blood Glucose Monitoring Suppl (ACCU-CHEK GUIDE ME) w/Device KIT 1 kit by Does not apply route daily. Use as instructed to check blood sugar once daily. E11.9 06/17/19   Ladell Pier, MD  glucose blood (ACCU-CHEK GUIDE) test strip Use as instructed to  check blood sugar once daily.  E11.9 06/17/19   Ladell Pier, MD  insulin aspart (NOVOLOG FLEXPEN) 100 UNIT/ML FlexPen 2 units subcut with meals for BS greater than 150. Patient taking differently: Inject 2 Units into the skin See admin instructions. Inject 2 units into the skin three times a day with meals for a BGL greater than 150 11/14/19   Ladell Pier, MD  Insulin Pen Needle (PEN NEEDLES) 31G X 8 MM MISC UAD 11/14/19   Ladell Pier, MD  oxyCODONE-acetaminophen (PERCOCET/ROXICET) 5-325 MG tablet Take 1 tablet by mouth every 6 (six) hours as needed for moderate pain. Patient not taking: Reported on 11/16/2019 09/27/19   Dagoberto Ligas, PA-C   No Known Allergies Review of Systems  Constitutional: Positive for fatigue.  Neurological: Positive for weakness.   Physical Exam Vitals and nursing note reviewed.  HENT:     Head: Normocephalic.     Nose: Nose normal.     Mouth/Throat:     Mouth: Mucous membranes are moist.  Eyes:     Pupils: Pupils are equal, round, and reactive to light.  Cardiovascular:     Rate and Rhythm: Normal rate and regular rhythm.  Pulmonary:     Effort: Pulmonary effort is normal.  Abdominal:     Palpations: Abdomen is soft.  Musculoskeletal:        General: Normal range of motion.     Cervical back: Normal range of motion.  Skin:    General: Skin is warm.     Capillary Refill: Capillary refill takes less than 2 seconds.  Neurological:     Mental Status: He is alert and oriented to person, place, and time.  Psychiatric:        Mood and Affect: Mood normal.    Vital Signs: BP (!) 164/103 (BP Location: Left Arm)   Pulse 93   Temp 97.8 F (36.6 C) (Oral)   Resp 17   Wt 112 kg   SpO2 93%   BMI 32.58 kg/m  Pain Scale: 0-10   Pain Score: 0-No pain  SpO2: SpO2: 93 % O2 Device:SpO2: 93 % O2 Flow Rate: .O2 Flow Rate (L/min): 2 L/min  IO: Intake/output summary:   Intake/Output Summary (Last 24 hours) at 11/20/2019 0648 Last data  filed at 11/20/2019 0110 Gross per 24 hour  Intake 240 ml  Output 4000 ml  Net -3760 ml   LBM: Last BM Date: 11/17/19 Baseline Weight: Weight: 112 kg Most recent weight: Weight: 112 kg     Palliative Assessment/Data:  80% Time In: 0640 Time Out: 0750 Time Total: 70 Greater than 50%  of this time was spent counseling and coordinating care related to the above assessment and plan.  Signed by: Rosezella Rumpf, NP   Please contact Palliative Medicine Team phone at [phone] for questions and concerns.  For individual provider: See Shea Evans

## 2019-11-20 NOTE — Progress Notes (Signed)
Long d/w Mr Hoffmann this am-he has talked with his wife-he has thought through matters relating to HD for the past several days-he has decided AGAINST continuing outpatient HD-he wishes to continue HOLISTIC treatment  This MD then spoke with his spouse over the phone [phone] confirms that they have discussed this, and that she supports her husbands decision  I have AGAIN clearly told the patient that OUR recommendation is to continue with HD.  He is aware of the [facility] [place] of continuing to refuse Dialysis. His wife was also made aware of the risks.  Patient requests that he be discharged home TODAY after HD, he wants the temp HD catheter to be removed RIGHT after HD.  Note-he had refused Tunneled HD catheter placement a few days back   DIFFICULT SITUATION   Will notify the renal team.

## 2019-11-20 NOTE — Discharge Summary (Addendum)
PATIENT DETAILS Name: Patrick Ibarra Age: 39 y.o. Sex: male Date of Birth: 08-May-1981 MRN: [id number]. Admitting Physician: Collier Bullock, MD PJA:[id number], Dalbert Batman, MD  Admit Date: 11/16/2019 Discharge date: 11/20/2019  Recommendations for Outpatient Follow-up:  1. Follow up with PCP in 1-2 weeks 2. Please obtain CMP/CBC in one week 3. Repeat Chest Xray in 4-6 week 4. Refusing outpatient HD-refusing tunneled dialysis catheter- will need very close monitoring in the outpatient setting-but will probably come back to the emergency with acute/life-threatening issues.  Admitted From:  Home  Disposition: [place]: No  Equipment/Devices: None  Discharge Condition: Stable  CODE STATUS: FULL CODE  Diet recommendation:  Diet Order            Diet - low sodium heart healthy        Diet renal/carb modified with fluid restriction Diet-HS Snack? Nothing; Fluid restriction: 1200 mL Fluid; Room service appropriate? Yes; Fluid consistency: Thin  Diet effective now               Brief Narrative: Patient is a 39 y.o. male with PMHx of-used to be on hemodialysis-but elected to stop hemodialysis last month-presented with acute uremic encephalopathy, hyperkalemia acute hypoxic respiratory failure secondary to volume overload, severe anemia, was found to be Covid positive-admitted to the [facility] clinical stability-transfer to the [facility] hospitalist service.  See below for further details.  Significant Events: 1/29-2/2: Admit for hypoxic respiratory failure, hyperkalemia, metabolic acidosis-requiring emergent HD-left AMA-did not want any further HD-as patient wanted to pursue holistic therapy.  Last HD on 2/1.  3/16>> admit to [facility] ICU-volume overload, hyperkalemia, bradycardia with sine wave, hemoglobin of 4.8 and acute hypoxic respiratory failure secondary to pulmonary pneumonia.    3/20>> has made up his mind to pursue holistic treatment-refusing outpatient HD-going  home this afternoon after he completes inpatient HD.  Microbiology data: 3/16>> COVID-19 positive  DVT prophylaxis: Subcutaneous heparin  Procedures: 3/16>> right femoral paralysis catheter  Consults: PCCM, nephrology,Palliative care  Brief Hospital Course: Acute Hypoxic Resp Failure due to pulmonary edema in the setting of volume overload due to missed HD: Improved-with HD-on room air  COVID-19 pneumonia: Remains on room air-has completed a course of steroids and remdesivir.  COVID-19 Labs:  Recent Labs    11/18/19 0556 11/19/19 0227 11/20/19 0305  DDIMER 5.04* 3.95* 6.40*  FERRITIN 4,744* 2,949* 2,024*  CRP 9.9* 7.7* 6.0*    Lab Results  Component Value Date   SARSCOV2NAA POSITIVE (A) 11/16/2019   [place] NEGATIVE 10/01/2019   [place] NEGATIVE 09/26/2019   [place] NEGATIVE 09/25/2019     ESRD: Refused HD last admission in favor for a more holistic approach-and had left AMA.  Presented with severe anemia/hyperkalemia-with EKG changes and volume overload requiring emergent hemodialysis.  Numerous physicians-including this MD have extensive discussion with patient.  He has been counseled regarding importance of continuing regular outpatient hemodialysis.  Unfortunately-even after extensive counseling-patient has refused to continue hemodialysis in the outpatient setting-wants to continue a holistic therapy approach.  I have in the past few days-explained the life-threatening-life disabling consequences of not undergoing dialysis.  He understands that he could potentially die-and sustain permanent anoxic injury from a cardiac arrest.  He understands the risks-but feels like he could monitor his blood work-and continue working on the holistic approach.  He also refused to have a tunneled hemodialysis catheter placed this admission.  He did inquire briefly about home hemodialysis and peritoneal dialysis-but per nephrology he first needs to show compliance to  outpatient center dialysis.  In any event-this morning-he did ask me to discharge him home after hemodialysis-and wanted the temporary dialysis catheter in his groin removed.  I subsequently spoke with his spouse over the phone-who confirmed that they had a long discussion-and she supports the patient's decision.  She was made aware of the life-threatening and life disabling effects of missing dialysis treatments.    Patient is completely awake alert oriented-understands the need for dialysis-understands the risks of missing hemodialysis-has inquired about peritoneal dialysis/home hemodialysis-but at the end of the day-still wants to continue with holistic therapy in spite of being admitted this admission with a life-threatening event.  Have spoken with nephrology-they really do not have any other recommendations-patient is being discharged home at his own request.  Transaminitis: Likely from Covid pneumonitis (congestion in the setting of volume overload.  Improving slowly.  Follow.   Severe anemia: Secondary to noncompliant with HD-missing Aranesp injections with hemodialysis-2 units of PRBC on 3/16-hemoglobin relatively stable.  No indication of any blood loss at this point.    Managed with Aranesp-hemoglobin 8.0 on discharge (4.8 on admission)  Hypertension: Controlled-continue with amlodipine, Coreg  DM-2: CBGs were stable with SSI.  Obesity: Estimated body mass index is 31.3 kg/m as calculated from the following:   Height as of 11/10/19: 6\' 1"  (1.854 m).   Weight as of this encounter: 107.6 kg.   Discharge Instructions:    Person Under Monitoring Name: Patrick Ibarra  Location: [place] [id number]   Infection Prevention Recommendations for Individuals Confirmed to have, or Being Evaluated for, 2019 Novel Coronavirus (COVID-19) Infection Who Receive Care at Home  Individuals who are confirmed to have, or are being evaluated for, COVID-19 should follow  the prevention steps below until a healthcare provider or local or state health department says they can return to normal activities.  Stay home except to get medical care You should restrict activities outside your home, except for getting medical care. Do not go to work, school, or public areas, and do not use public transportation or taxis.  Call ahead before visiting your doctor Before your medical appointment, call the healthcare provider and tell them that you have, or are being evaluated for, COVID-19 infection. This will help the healthcare provider's office take steps to keep other people from getting infected. Ask your healthcare provider to call the local or state health department.  Monitor your symptoms Seek prompt medical attention if your illness is worsening (e.g., difficulty breathing). Before going to your medical appointment, call the healthcare provider and tell them that you have, or are being evaluated for, COVID-19 infection. Ask your healthcare provider to call the local or state health department.  Wear a facemask You should wear a facemask that covers your nose and mouth when you are in the same room with other people and when you visit a healthcare provider. People who live with or visit you should also wear a facemask while they are in the same room with you.  Separate yourself from other people in your home As much as possible, you should stay in a different room from other people in your home. Also, you should use a separate bathroom, if available.  Avoid sharing household items You should not share dishes, drinking glasses, cups, eating utensils, towels, bedding, or other items with other people in your home. After using these items, you should wash them thoroughly with soap and water.  Cover your coughs and sneezes Cover your mouth  and nose with a tissue when you cough or sneeze, or you can cough or sneeze into your sleeve. Throw used tissues in a lined  trash can, and immediately wash your hands with soap and water for at least 20 seconds or use an alcohol-based hand rub.  Wash your [facility] your hands often and thoroughly with soap and water for at least 20 seconds. You can use an alcohol-based hand sanitizer if soap and water are not available and if your hands are not visibly dirty. Avoid touching your eyes, nose, and mouth with unwashed hands.   Prevention Steps for Caregivers and Household Members of Individuals Confirmed to have, or Being Evaluated for, COVID-19 Infection Being Cared for in the Home  If you live with, or provide care at home for, a person confirmed to have, or being evaluated for, COVID-19 infection please follow these guidelines to prevent infection:  Follow healthcare provider's instructions Make sure that you understand and can help the patient follow any healthcare provider instructions for all care.  Provide for the patient's basic needs You should help the patient with basic needs in the home and provide support for getting groceries, prescriptions, and other personal needs.  Monitor the patient's symptoms If they are getting sicker, call his or her medical provider and tell them that the patient has, or is being evaluated for, COVID-19 infection. This will help the healthcare provider's office take steps to keep other people from getting infected. Ask the healthcare provider to call the local or state health department.  Limit the number of people who have contact with the patient  If possible, have only one caregiver for the patient.  Other household members should stay in another home or place of residence. If this is not possible, they should stay  in another room, or be separated from the patient as much as possible. Use a separate bathroom, if available.  Restrict visitors who do not have an essential need to be in the home.  Keep older adults, very young children, and other sick people away  from the patient Keep older adults, very young children, and those who have compromised immune systems or chronic health conditions away from the patient. This includes people with chronic heart, lung, or kidney conditions, diabetes, and cancer.  Ensure good ventilation Make sure that shared spaces in the home have good air flow, such as from an air conditioner or an opened window, weather permitting.  Wash your hands often  Wash your hands often and thoroughly with soap and water for at least 20 seconds. You can use an alcohol based hand sanitizer if soap and water are not available and if your hands are not visibly dirty.  Avoid touching your eyes, nose, and mouth with unwashed hands.  Use disposable paper towels to dry your hands. If not available, use dedicated cloth towels and replace them when they become wet.  Wear a facemask and gloves  Wear a disposable facemask at all times in the room and gloves when you touch or have contact with the patient's blood, body fluids, and/or secretions or excretions, such as sweat, saliva, sputum, nasal mucus, vomit, urine, or feces.  Ensure the mask fits over your nose and mouth tightly, and do not touch it during use.  Throw out disposable facemasks and gloves after using them. Do not reuse.  Wash your hands immediately after removing your facemask and gloves.  If your personal clothing becomes contaminated, carefully remove clothing and launder. Wash your  hands after handling contaminated clothing.  Place all used disposable facemasks, gloves, and other waste in a lined container before disposing them with other household waste.  Remove gloves and wash your hands immediately after handling these items.  Do not share dishes, glasses, or other household items with the patient  Avoid sharing household items. You should not share dishes, drinking glasses, cups, eating utensils, towels, bedding, or other items with a patient who is confirmed to  have, or being evaluated for, COVID-19 infection.  After the person uses these items, you should wash them thoroughly with soap and water.  Wash laundry thoroughly  Immediately remove and wash clothes or bedding that have blood, body fluids, and/or secretions or excretions, such as sweat, saliva, sputum, nasal mucus, vomit, urine, or feces, on them.  Wear gloves when handling laundry from the patient.  Read and follow directions on labels of laundry or clothing items and detergent. In general, wash and dry with the warmest temperatures recommended on the label.  Clean all areas the individual has used often  Clean all touchable surfaces, such as counters, tabletops, doorknobs, bathroom fixtures, toilets, phones, keyboards, tablets, and bedside tables, every day. Also, clean any surfaces that may have blood, body fluids, and/or secretions or excretions on them.  Wear gloves when cleaning surfaces the patient has come in contact with.  Use a diluted bleach solution (e.g., dilute bleach with 1 part bleach and 10 parts water) or a household disinfectant with a label that says EPA-registered for coronaviruses. To make a bleach solution at home, add 1 tablespoon of bleach to 1 quart (4 cups) of water. For a larger supply, add  cup of bleach to 1 gallon (16 cups) of water.  Read labels of cleaning products and follow recommendations provided on product labels. Labels contain instructions for safe and effective use of the cleaning product including precautions you should take when applying the product, such as wearing gloves or eye protection and making sure you have good ventilation during use of the product.  Remove gloves and wash hands immediately after cleaning.  Monitor yourself for signs and symptoms of illness Caregivers and household members are considered close contacts, should monitor their health, and will be asked to limit movement outside of the home to the extent possible. Follow  the monitoring steps for close contacts listed on the symptom monitoring form.   ? If you have additional questions, contact your local health department or call the epidemiologist on call at [phone] (available 24/7). ? This guidance is subject to change. For the most up-to-date guidance from CDC, please refer to their website: YouBlogs.pl    Activity:  As tolerated   Discharge Instructions    (HEART FAILURE PATIENTS) Call MD:  Anytime you have any of the following symptoms: 1) 3 pound weight gain in 24 hours or 5 pounds in 1 week 2) shortness of breath, with or without a dry hacking cough 3) swelling in the hands, feet or stomach 4) if you have to sleep on extra pillows at night in order to breathe.   Complete by: As directed    Call MD for:  difficulty breathing, headache or visual disturbances   Complete by: As directed    Call MD for:  extreme fatigue   Complete by: As directed    Call MD for:  persistant dizziness or light-headedness   Complete by: As directed    Call MD for:  persistant nausea and vomiting   Complete by: As directed  Call MD for:  temperature >100.4   Complete by: As directed    Diet - low sodium heart healthy   Complete by: As directed    Discharge instructions   Complete by: As directed    1.)  3 weeks of isolation from 11/16/2019  2.)  If you do decide to pursue outpatient hemodialysis-please contact your primary nephrologist ASAP.  If You have missed several dialysis-you could consider coming back to the emergency room as well.  3.)  Please get electrolytes checked at least 2 times a week-if you have significantly elevated potassium levels-you could have a sudden cardiac arrest.  You are at risk of having high potassium levels-as you are not undergoing hemodialysis  4.)  Please get complete blood count checked at least once a week-you are at risk of having severely low hemoglobin  (anemia) due to not getting medications during hemodialysis.  If your hemoglobin levels are severely low-you may require a emergent blood transfusion.  5.)  You are at risk of getting shortness of breath because of missed hemodialysis.  If you develop shortness of breath-seek immediate medical attention.   Please follow with your primary nephrologist in 1 week and your primary care practitioner in 1 week.  Please ask your primary care MD to check electrolytes at least 2 times a week.  Please ask your primary care MD to check a CBC at least once a week.  Get Medicines reviewed and adjusted: Please take all your medications with you for your next visit with your Primary MD  Laboratory/radiological data: Please request your Primary MD to go over all hospital tests and procedure/radiological results at the follow up, please ask your Primary MD to get all Hospital records sent to his/her office.  In some cases, they will be blood work, cultures and biopsy results pending at the time of your discharge. Please request that your primary care M.D. follows up on these results.  Also Note the following: If you experience worsening of your admission symptoms, develop shortness of breath, life threatening emergency, suicidal or homicidal thoughts you must seek medical attention immediately by calling 911 or calling your MD immediately  if symptoms less severe.  You must read complete instructions/literature along with all the possible adverse reactions/side effects for all the Medicines you take and that have been prescribed to you. Take any new Medicines after you have completely understood and accpet all the possible adverse reactions/side effects.   Do not drive when taking Pain medications or sleeping medications (Benzodaizepines)  Do not take more than prescribed Pain, Sleep and Anxiety Medications. It is not advisable to combine anxiety,sleep and pain medications without talking with your primary care  practitioner  Special Instructions: If you have smoked or chewed Tobacco  in the last 2 yrs please stop smoking, stop any regular Alcohol  and or any Recreational drug use.  Wear Seat belts while driving.  Please note: You were cared for by a hospitalist during your hospital stay. Once you are discharged, your primary care physician will handle any further medical issues. Please note that NO REFILLS for any discharge medications will be authorized once you are discharged, as it is imperative that you return to your primary care physician (or establish a relationship with a primary care physician if you do not have one) for your post hospital discharge needs so that they can reassess your need for medications and monitor your lab values.   Increase activity slowly   Complete by: As directed  Other Procedures/Studies: DG Abdomen 1 View  Result Date: 11/16/2019 CLINICAL DATA:  NG tube placement. EXAM: PORTABLE CHEST 1 VIEW COMPARISON:  One-view chest x-ray 11/16/2019 FINDINGS: Side port of the NG tube is in the stomach. Bowel gas pattern is unremarkable. IMPRESSION: Side port of the NG tube is in the stomach. Electronically Signed   By: San Morelle M.D.   On: 11/16/2019 04:54   DG Chest Port 1 View  Result Date: 11/16/2019 CLINICAL DATA:  NG tube placement. EXAM: PORTABLE CHEST 1 VIEW COMPARISON:  One-view chest x-ray 11/16/2019 FINDINGS: Side port of the NG tube is in the stomach. Bowel gas pattern is unremarkable. IMPRESSION: Side port of the NG tube is in the stomach. Electronically Signed   By: San Morelle M.D.   On: 11/16/2019 04:54     TODAY-DAY OF DISCHARGE:  Subjective:   [name] today has no headache,no chest abdominal pain,no new weakness tingling or numbness, feels much better wants to go home today.   Objective:   Blood pressure (!) 172/85, pulse 86, temperature 98.6 F (37 C), temperature source Axillary, resp. rate 18, weight 107.6 kg, SpO2 95  %.  Intake/Output Summary (Last 24 hours) at 11/20/2019 1827 Last data filed at 11/20/2019 1455 Gross per 24 hour  Intake 960 ml  Output 7500 ml  Net -6540 ml   Filed Weights   11/19/19 2130 11/20/19 1025 11/20/19 1418  Weight: 112 kg 110.9 kg 107.6 kg    Exam: Awake Alert, Oriented *3, No new F.N deficits, Normal affect [place].AT,PERRAL Supple Neck,No JVD, No cervical lymphadenopathy appriciated.  Symmetrical Chest wall movement, Good air movement bilaterally, CTAB RRR,No Gallops,Rubs or new Murmurs, No Parasternal Heave +ve B.Sounds, Abd Soft, Non tender, No organomegaly appriciated, No rebound -guarding or rigidity. No Cyanosis, Clubbing or edema, No new Rash or bruise   PERTINENT RADIOLOGIC STUDIES: DG Abdomen 1 View  Result Date: 11/16/2019 CLINICAL DATA:  NG tube placement. EXAM: PORTABLE CHEST 1 VIEW COMPARISON:  One-view chest x-ray 11/16/2019 FINDINGS: Side port of the NG tube is in the stomach. Bowel gas pattern is unremarkable. IMPRESSION: Side port of the NG tube is in the stomach. Electronically Signed   By: San Morelle M.D.   On: 11/16/2019 04:54   DG Chest Port 1 View  Result Date: 11/16/2019 CLINICAL DATA:  NG tube placement. EXAM: PORTABLE CHEST 1 VIEW COMPARISON:  One-view chest x-ray 11/16/2019 FINDINGS: Side port of the NG tube is in the stomach. Bowel gas pattern is unremarkable. IMPRESSION: Side port of the NG tube is in the stomach. Electronically Signed   By: San Morelle M.D.   On: 11/16/2019 04:54     PERTINENT LAB RESULTS: CBC: Recent Labs    11/19/19 0227 11/20/19 0803  WBC 8.6 10.3  HGB 7.5* 8.0*  HCT 23.9* 25.3*  PLT 222 276   CMET CMP     Component Value Date/Time   NA 136 11/20/2019 0803   NA 134 03/18/2017 0942   K 3.8 11/20/2019 0803   CL 95 (L) 11/20/2019 0803   CO2 24 11/20/2019 0803   GLUCOSE 137 (H) 11/20/2019 0803   BUN 44 (H) 11/20/2019 0803   BUN 135 (HH) 03/18/2017 0942   CREATININE 16.73 (H) 11/20/2019  0803   CREATININE 3.16 (H) 01/05/2014 1459   CALCIUM 8.0 (L) 11/20/2019 0803   CALCIUM 7.4 (L) 03/01/2017 0752   PROT 5.8 (L) 11/20/2019 0803   PROT 8.0 06/10/2019 1214   ALBUMIN 2.3 (L) 11/20/2019 9323  ALBUMIN 2.3 (L) 11/20/2019 0803   ALBUMIN 4.2 06/10/2019 1214   AST 33 11/20/2019 0803   ALT 74 (H) 11/20/2019 0803   ALKPHOS 56 11/20/2019 0803   BILITOT 0.9 11/20/2019 0803   BILITOT 0.3 06/10/2019 1214   GFRNONAA 3 (L) 11/20/2019 0803   GFRAA 4 (L) 11/20/2019 0803    GFR Estimated Creatinine Clearance: 7.9 mL/min (A) (by C-G formula based on SCr of 16.73 mg/dL (H)). No results for input(s): LIPASE, AMYLASE in the last 72 hours. No results for input(s): CKTOTAL, CKMB, CKMBINDEX, TROPONINI in the last 72 hours. Invalid input(s): POCBNP Recent Labs    11/19/19 0227 11/20/19 0305  DDIMER 3.95* 6.40*   No results for input(s): HGBA1C in the last 72 hours. No results for input(s): CHOL, HDL, LDLCALC, TRIG, CHOLHDL, LDLDIRECT in the last 72 hours. No results for input(s): TSH, T4TOTAL, T3FREE, THYROIDAB in the last 72 hours.  Invalid input(s): FREET3 Recent Labs    11/19/19 0227 11/20/19 0305  FERRITIN 2,949* 2,024*   Coags: No results for input(s): INR in the last 72 hours.  Invalid input(s): PT Microbiology: Recent Results (from the past 240 hour(s))  Respiratory Panel by RT PCR (Flu A&B, Covid) - Nasopharyngeal Swab     Status: Abnormal   Collection Time: 11/16/19  4:01 AM   Specimen: Nasopharyngeal Swab  Result Value Ref Range Status   SARS Coronavirus 2 by RT PCR POSITIVE (A) NEGATIVE Final    Comment: RESULT CALLED TO, READ BACK BY AND VERIFIED WITH: J. GLOSTER,RN 0500 11/16/2019 T. TYSOR (NOTE) SARS-CoV-2 target nucleic acids are DETECTED. SARS-CoV-2 RNA is generally detectable in upper respiratory specimens  during the acute phase of infection. Positive results are indicative of the presence of the identified virus, but do not rule out bacterial  infection or co-infection with other pathogens not detected by the test. Clinical correlation with patient history and other diagnostic information is necessary to determine patient infection status. The expected result is Negative. Fact Sheet for Patients:  PinkCheek.be Fact Sheet for Healthcare Providers: GravelBags.it This test is not yet approved or cleared by the [place] FDA and  has been authorized for detection and/or diagnosis of SARS-CoV-2 by FDA under an Emergency Use Authorization (EUA).  This EUA will remain in effect (meaning this test can be used ) for the duration of  the COVID-19 declaration under Section 564(b)(1) of the Act, 21 U.S.C. section 360bbb-3(b)(1), unless the authorization is terminated or revoked sooner.    Influenza A by PCR NEGATIVE NEGATIVE Final   Influenza B by PCR NEGATIVE NEGATIVE Final    Comment: (NOTE) The Xpert Xpress SARS-CoV-2/FLU/RSV assay is intended as an aid in  the diagnosis of influenza from Nasopharyngeal swab specimens and  should not be used as a sole basis for treatment. Nasal washings and  aspirates are unacceptable for Xpert Xpress SARS-CoV-2/FLU/RSV  testing. Fact Sheet for Patients: PinkCheek.be Fact Sheet for Healthcare Providers: GravelBags.it This test is not yet approved or cleared by the [place] FDA and  has been authorized for detection and/or diagnosis of SARS-CoV-2 by  FDA under an Emergency Use Authorization (EUA). This EUA will remain  in effect (meaning this test can be used) for the duration of the  Covid-19 declaration under Section 564(b)(1) of the Act, 21  U.S.C. section 360bbb-3(b)(1), unless the authorization is  terminated or revoked. Performed at [facility], [place] [address] [place], [place] [address]   MRSA PCR Screening     Status: None  Collection Time: 11/16/19  8:00  AM   Specimen: Nasal Mucosa; Nasopharyngeal  Result Value Ref Range Status   MRSA by PCR NEGATIVE NEGATIVE Final    Comment:        The GeneXpert MRSA Assay (FDA approved for NASAL specimens only), is one component of a comprehensive MRSA colonization surveillance program. It is not intended to diagnose MRSA infection nor to guide or monitor treatment for MRSA infections. Performed at [facility], [place] [address], [place], [place] [address]     FURTHER DISCHARGE INSTRUCTIONS:  Get Medicines reviewed and adjusted: Please take all your medications with you for your next visit with your Primary MD  Laboratory/radiological data: Please request your Primary MD to go over all hospital tests and procedure/radiological results at the follow up, please ask your Primary MD to get all Hospital records sent to his/her office.  In some cases, they will be blood work, cultures and biopsy results pending at the time of your discharge. Please request that your primary care M.D. goes through all the records of your hospital data and follows up on these results.  Also Note the following: If you experience worsening of your admission symptoms, develop shortness of breath, life threatening emergency, suicidal or homicidal thoughts you must seek medical attention immediately by calling 911 or calling your MD immediately  if symptoms less severe.  You must read complete instructions/literature along with all the possible adverse reactions/side effects for all the Medicines you take and that have been prescribed to you. Take any new Medicines after you have completely understood and accpet all the possible adverse reactions/side effects.   Do not drive when taking Pain medications or sleeping medications (Benzodaizepines)  Do not take more than prescribed Pain, Sleep and Anxiety Medications. It is not advisable to combine anxiety,sleep and pain medications without talking with your primary care  practitioner  Special Instructions: If you have smoked or chewed Tobacco  in the last 2 yrs please stop smoking, stop any regular Alcohol  and or any Recreational drug use.  Wear Seat belts while driving.  Please note: You were cared for by a hospitalist during your hospital stay. Once you are discharged, your primary care physician will handle any further medical issues. Please note that NO REFILLS for any discharge medications will be authorized once you are discharged, as it is imperative that you return to your primary care physician (or establish a relationship with a primary care physician if you do not have one) for your post hospital discharge needs so that they can reassess your need for medications and monitor your lab values.  Total Time spent coordinating discharge including counseling, education and face to face time equals  45 minutes.  SignedOren Binet 11/20/2019 6:27 PM

## 2019-11-20 NOTE — Progress Notes (Signed)
Dr. Sloan Leiter had discussion with patient and then patient's spouse via telephone regarding patient's decision to refuse dialysis and to proceed with Holistic treatment of his ESRD.  Patient and spouse both made aware of the risks of death with proceeding to decline HD.

## 2019-11-20 NOTE — Discharge Instructions (Signed)
Person Under Monitoring Name: Patrick Ibarra  Location: [place] [id number]   Infection Prevention Recommendations for Individuals Confirmed to have, or Being Evaluated for, 2019 Novel Coronavirus (COVID-19) Infection Who Receive Care at Home  Individuals who are confirmed to have, or are being evaluated for, COVID-19 should follow the prevention steps below until a healthcare provider or local or state health department says they can return to normal activities.  Stay home except to get medical care You should restrict activities outside your home, except for getting medical care. Do not go to work, school, or public areas, and do not use public transportation or taxis.  Call ahead before visiting your doctor Before your medical appointment, call the healthcare provider and tell them that you have, or are being evaluated for, COVID-19 infection. This will help the healthcare provider's office take steps to keep other people from getting infected. Ask your healthcare provider to call the local or state health department.  Monitor your symptoms Seek prompt medical attention if your illness is worsening (e.g., difficulty breathing). Before going to your medical appointment, call the healthcare provider and tell them that you have, or are being evaluated for, COVID-19 infection. Ask your healthcare provider to call the local or state health department.  Wear a facemask You should wear a facemask that covers your nose and mouth when you are in the same room with other people and when you visit a healthcare provider. People who live with or visit you should also wear a facemask while they are in the same room with you.  Separate yourself from other people in your home As much as possible, you should stay in a different room from other people in your home. Also, you should use a separate bathroom, if available.  Avoid sharing household items You should  not share dishes, drinking glasses, cups, eating utensils, towels, bedding, or other items with other people in your home. After using these items, you should wash them thoroughly with soap and water.  Cover your coughs and sneezes Cover your mouth and nose with a tissue when you cough or sneeze, or you can cough or sneeze into your sleeve. Throw used tissues in a lined trash can, and immediately wash your hands with soap and water for at least 20 seconds or use an alcohol-based hand rub.  Wash your [facility] your hands often and thoroughly with soap and water for at least 20 seconds. You can use an alcohol-based hand sanitizer if soap and water are not available and if your hands are not visibly dirty. Avoid touching your eyes, nose, and mouth with unwashed hands.   Prevention Steps for Caregivers and Household Members of Individuals Confirmed to have, or Being Evaluated for, COVID-19 Infection Being Cared for in the Home  If you live with, or provide care at home for, a person confirmed to have, or being evaluated for, COVID-19 infection please follow these guidelines to prevent infection:  Follow healthcare provider's instructions Make sure that you understand and can help the patient follow any healthcare provider instructions for all care.  Provide for the patient's basic needs You should help the patient with basic needs in the home and provide support for getting groceries, prescriptions, and other personal needs.  Monitor the patient's symptoms If they are getting sicker, call his or her medical provider and tell them that the patient has, or is being evaluated for, COVID-19 infection. This will help the healthcare provider's  office take steps to keep other people from getting infected. Ask the healthcare provider to call the local or state health department.  Limit the number of people who have contact with the patient  If possible, have only one caregiver for the  patient.  Other household members should stay in another home or place of residence. If this is not possible, they should stay  in another room, or be separated from the patient as much as possible. Use a separate bathroom, if available.  Restrict visitors who do not have an essential need to be in the home.  Keep older adults, very young children, and other sick people away from the patient Keep older adults, very young children, and those who have compromised immune systems or chronic health conditions away from the patient. This includes people with chronic heart, lung, or kidney conditions, diabetes, and cancer.  Ensure good ventilation Make sure that shared spaces in the home have good air flow, such as from an air conditioner or an opened window, weather permitting.  Wash your hands often  Wash your hands often and thoroughly with soap and water for at least 20 seconds. You can use an alcohol based hand sanitizer if soap and water are not available and if your hands are not visibly dirty.  Avoid touching your eyes, nose, and mouth with unwashed hands.  Use disposable paper towels to dry your hands. If not available, use dedicated cloth towels and replace them when they become wet.  Wear a facemask and gloves  Wear a disposable facemask at all times in the room and gloves when you touch or have contact with the patient's blood, body fluids, and/or secretions or excretions, such as sweat, saliva, sputum, nasal mucus, vomit, urine, or feces.  Ensure the mask fits over your nose and mouth tightly, and do not touch it during use.  Throw out disposable facemasks and gloves after using them. Do not reuse.  Wash your hands immediately after removing your facemask and gloves.  If your personal clothing becomes contaminated, carefully remove clothing and launder. Wash your hands after handling contaminated clothing.  Place all used disposable facemasks, gloves, and other waste in a lined  container before disposing them with other household waste.  Remove gloves and wash your hands immediately after handling these items.  Do not share dishes, glasses, or other household items with the patient  Avoid sharing household items. You should not share dishes, drinking glasses, cups, eating utensils, towels, bedding, or other items with a patient who is confirmed to have, or being evaluated for, COVID-19 infection.  After the person uses these items, you should wash them thoroughly with soap and water.  Wash laundry thoroughly  Immediately remove and wash clothes or bedding that have blood, body fluids, and/or secretions or excretions, such as sweat, saliva, sputum, nasal mucus, vomit, urine, or feces, on them.  Wear gloves when handling laundry from the patient.  Read and follow directions on labels of laundry or clothing items and detergent. In general, wash and dry with the warmest temperatures recommended on the label.  Clean all areas the individual has used often  Clean all touchable surfaces, such as counters, tabletops, doorknobs, bathroom fixtures, toilets, phones, keyboards, tablets, and bedside tables, every day. Also, clean any surfaces that may have blood, body fluids, and/or secretions or excretions on them.  Wear gloves when cleaning surfaces the patient has come in contact with.  Use a diluted bleach solution (e.g., dilute bleach with 1  part bleach and 10 parts water) or a household disinfectant with a label that says EPA-registered for coronaviruses. To make a bleach solution at home, add 1 tablespoon of bleach to 1 quart (4 cups) of water. For a larger supply, add  cup of bleach to 1 gallon (16 cups) of water.  Read labels of cleaning products and follow recommendations provided on product labels. Labels contain instructions for safe and effective use of the cleaning product including precautions you should take when applying the product, such as wearing gloves or  eye protection and making sure you have good ventilation during use of the product.  Remove gloves and wash hands immediately after cleaning.  Monitor yourself for signs and symptoms of illness Caregivers and household members are considered close contacts, should monitor their health, and will be asked to limit movement outside of the home to the extent possible. Follow the monitoring steps for close contacts listed on the symptom monitoring form.   ? If you have additional questions, contact your local health department or call the epidemiologist on call at [phone] (available 24/7). ? This guidance is subject to change. For the most up-to-date guidance from [facility], please refer to their website: YouBlogs.pl

## 2019-11-20 NOTE — Progress Notes (Signed)
Patient off the floor for dialysis.

## 2019-11-20 NOTE — Progress Notes (Signed)
Admit: 11/16/2019 LOS: 4  82M ESRD stopped HD x6wk presented with severe hyperkalemia, anemia, COVID19  Subjective:  Patient not examined today directly given COVID-19 + status, utilizing data taken from chart +/- discussions w/ providers and staff.   Per pmd pt is leaving today after HD.  Temporary Fem HD cath will be removed post HD today.   03/19 0701 - 03/20 0700 In: 240 [P.O.:240] Out: 4000   Filed Weights   11/18/19 0500 11/19/19 2130 11/20/19 1025  Weight: 110.8 kg 112 kg 110.9 kg    Scheduled Meds: . amLODipine  10 mg Oral Daily  . carvedilol  12.5 mg Oral BID  . Chlorhexidine Gluconate Cloth  6 each Topical Daily  . [START ON 11/23/2019] darbepoetin (ARANESP) injection - DIALYSIS  150 mcg Intravenous Q Tue-HD  . heparin injection (subcutaneous)  5,000 Units Subcutaneous Q8H  . insulin aspart  0-5 Units Subcutaneous QHS  . insulin aspart  0-9 Units Subcutaneous TID WC   Continuous Infusions: . dextrose 50 mL/hr at 11/19/19 0913   PRN Meds:.acetaminophen, hydrALAZINE, labetalol, ondansetron (ZOFRAN) IV  Current Labs: reviewed    Physical Exam:  Blood pressure (!) 189/87, pulse 85, temperature 99.4 F (37.4 C), temperature source Axillary, resp. rate 18, weight 110.9 kg, SpO2 93 %. NAD RRR nl s1s2 no rub CTAB nl wob 1-2+ LE edema Femoral Temp HD cath bandaged  A / P 1. Severe hyperkalemia/+ EKG changes - s/p med stabilization+ emergent HD 3/16, resolved.  2. ESRD -  no HD for 6 wks; HD x 4 here (including today). Pt is again refusing long-term HD and long-term HD access.  Will be dc'd today after temp cath dc'd.  3. COVID19+ remdisiver/dexamethasone, per pmd 4. Uremia - improved sig with HD 5. Severe anemia of CKD - Hb 4.8; transfued >> 7.4; high ferritin 2/2 #4 6. Metabolic acidosis- resolved 7. CKD-BMD- uncontrolled, hyperphosphatemic and noncompliant 8. HTN/Vol- somewhat still hypervolemic but sig improved   Kelly Splinter, MD 11/20/2019, 12:37  PM     Recent Labs  Lab 11/18/19 0556 11/18/19 1209 11/19/19 0227 11/20/19 0305 11/20/19 0803  NA  --  136 134*  --  136  K  --  4.7 4.4  --  3.8  CL  --  91* 91*  --  95*  CO2  --  23 21*  --  24  GLUCOSE  --  141* 163*  --  137*  BUN  --  79* 83*  --  44*  CREATININE  --  26.69* 27.25*  --  16.73*  CALCIUM  --  8.1* 7.9*  --  8.0*  PHOS   < >  --  9.3* 6.3* 6.9*   < > = values in this interval not displayed.   Recent Labs  Lab 11/16/19 0401 11/16/19 0900 11/18/19 0556 11/19/19 0227 11/20/19 0803  WBC 4.6   < > 5.5 8.6 10.3  NEUTROABS 3.5  --   --   --   --   HGB 4.8*   < > 7.4* 7.5* 8.0*  HCT 15.9*   < > 23.1* 23.9* 25.3*  MCV 94.6   < > 90.2 92.3 91.3  PLT 125*   < > 172 222 276   < > = values in this interval not displayed.

## 2019-11-22 ENCOUNTER — Other Ambulatory Visit: Payer: Self-pay

## 2019-11-22 ENCOUNTER — Telehealth: Payer: Self-pay

## 2019-11-22 DIAGNOSIS — U071 COVID-19: Secondary | ICD-10-CM

## 2019-11-22 DIAGNOSIS — N186 End stage renal disease: Secondary | ICD-10-CM

## 2019-11-22 NOTE — Telephone Encounter (Signed)
Transition Care Management Follow-up Telephone Call  Date of discharge and from where: 11/20/2019 Patrick Ibarra  How have you been since you were released from the hospital? Feels better   Any questions or concerns? Would like being set up with a Nephrologist   Items Reviewed:  Did the pt receive and understand the discharge instructions provided?  YES   Medications obtained and verified?  YES    Any new allergies since your discharge?  None    Dietary orders reviewed? YES    Do you have support at home?  Wife   Functional Questionnaire: (I = Independent and D = Dependent) ADLs: I   Bathing/Dressing- I  Meal Prep- I  Eating- i  Maintaining continence- I  Transferring/Ambulation- I  Managing Meds- I  Follow up appointments reviewed:   PCP Hospital f/u appt confirmed?  Scheduled to see Patrick Jaffe Clung PA on 12/01/2019@ 9:30am  Specialist Hospital f/u appt confirmed?  NONE SCHEDULED  Are transportation arrangements needed?  Yes   If their condition worsens, is the pt aware to call PCP or go to the Emergency Dept.?  YES  Was the patient provided with contact information for the PCP's office or ED?  Yes  Was to pt encouraged to call back with questions or concerns? Yes . Name and contact provided   CBG 110 last reading / States sugar is being under control  Pt stated he does no longer want to do dialysis  And wants to go another way/ Requested a referral to see a new nephrologist.

## 2019-11-23 DIAGNOSIS — Z20828 Contact with and (suspected) exposure to other viral communicable diseases: Secondary | ICD-10-CM | POA: Diagnosis not present

## 2019-11-29 ENCOUNTER — Other Ambulatory Visit: Payer: Self-pay | Admitting: *Deleted

## 2019-11-29 NOTE — Progress Notes (Deleted)
Patient ID: DEANDRAE WAJDA, male   DOB: 04/22/1981, 39 y.o.   MRN: [id number]   After hospitalization 3 16-3/20/2021 for Covid 19 and ESRD.  Brief Narrative: Patient is a38 y.o.malewith PMHx of-used to be on hemodialysis-but elected to stop hemodialysis last month-presented with acute uremic encephalopathy, hyperkalemia acute hypoxic respiratory failure secondary to volume overload, severe anemia, was found to be Covid positive-admitted to the [facility] clinical stability-transfer to the [facility] hospitalist service. See below for further details.  Significant Events: 1/29-2/2: Admit for hypoxic respiratory failure, hyperkalemia, metabolic acidosis-requiring emergent HD-left AMA-did not want any further HD-as patient wanted to pursue holistic therapy. Last HD on 2/1.  3/16>>admit to [place] overload, hyperkalemia, bradycardia with sine wave, hemoglobin of 4.8 and acute hypoxic respiratory failure secondary to pulmonary pneumonia.   3/20>> has made up his mind to pursue holistic treatment-refusing outpatient HD-going home this afternoon after he completes inpatient HD.  Microbiology data: 3/16>> COVID-19 positive  DVT prophylaxis: Subcutaneous heparin  Procedures: 3/16>> right femoral paralysis catheter  Consults: PCCM, nephrology,Palliative care  Brief Hospital Course: Acute Hypoxic Resp Failure due to pulmonary edema in the setting of volume overload due to missed OF:[id number] HD-on room air  COVID-19 pneumonia:Remains on room air-has completed a course of steroids and remdesivir.

## 2019-11-29 NOTE — Patient Outreach (Signed)
Initial telephone assessment:  39 year old male with ESRD, recent hospitalization for Acute uremic encephalopathy, hyperkalemia, acute hypoxic respiratory failure due to volume overload, severe anemia and COVID positive. Pt does not want continued HD want to pursue holistic options.   I spoke with Mrs. teale today and she reports Mr. David has reconsidered having another HF fistula/graft and resuming dialysis.  She reports they are in financial hardship, he lost his insurance that helped to pay for the dialysis and now they do not have it. They cannot afford to pay for it out of pocket. They do not have a Education officer, museum to assist them through the process. Mrs. Sproles is trying to work so she is juggling work/life balance in a difficult situation.  Advised I will make an urgent LCSW referral for assistance. Provided my name and number and I will follow up at the end of the week.  [name]. [name], MSN, [facility] Gerontological Nurse Practitioner [facility] Care Management [phone]

## 2019-11-30 DIAGNOSIS — Z20828 Contact with and (suspected) exposure to other viral communicable diseases: Secondary | ICD-10-CM | POA: Diagnosis not present

## 2019-12-01 ENCOUNTER — Telehealth: Payer: Self-pay

## 2019-12-01 ENCOUNTER — Ambulatory Visit: Payer: Medicare Other

## 2019-12-01 ENCOUNTER — Ambulatory Visit: Payer: Self-pay | Admitting: *Deleted

## 2019-12-01 NOTE — Telephone Encounter (Signed)
Pt wife contacted the office and stated that pt had another sleepless night last night, breathing is getting horrible almost as if he is having a panic attack. Pt wife is wanting to know would an inhaler help. Pt is schedule for a tele with pcp on Monday April 5th

## 2019-12-02 ENCOUNTER — Other Ambulatory Visit: Payer: Self-pay | Admitting: *Deleted

## 2019-12-02 ENCOUNTER — Encounter: Payer: Self-pay | Admitting: *Deleted

## 2019-12-02 ENCOUNTER — Inpatient Hospital Stay (HOSPITAL_COMMUNITY): Payer: Medicare Other

## 2019-12-02 ENCOUNTER — Inpatient Hospital Stay (HOSPITAL_COMMUNITY)
Admission: EM | Admit: 2019-12-02 | Discharge: 2019-12-11 | DRG: 628 | Disposition: A | Discharge status: Home / Self Care | Payer: Medicare Other | Attending: Internal Medicine | Admitting: Internal Medicine

## 2019-12-02 ENCOUNTER — Emergency Department (HOSPITAL_COMMUNITY): Payer: Medicare Other

## 2019-12-02 DIAGNOSIS — I120 Hypertensive chronic kidney disease with stage 5 chronic kidney disease or end stage renal disease: Secondary | ICD-10-CM | POA: Diagnosis not present

## 2019-12-02 DIAGNOSIS — R0689 Other abnormalities of breathing: Secondary | ICD-10-CM | POA: Diagnosis not present

## 2019-12-02 DIAGNOSIS — Z978 Presence of other specified devices: Secondary | ICD-10-CM

## 2019-12-02 DIAGNOSIS — Z833 Family history of diabetes mellitus: Secondary | ICD-10-CM | POA: Diagnosis not present

## 2019-12-02 DIAGNOSIS — R0602 Shortness of breath: Secondary | ICD-10-CM | POA: Diagnosis not present

## 2019-12-02 DIAGNOSIS — T82868A Thrombosis of vascular prosthetic devices, implants and grafts, initial encounter: Secondary | ICD-10-CM | POA: Diagnosis present

## 2019-12-02 DIAGNOSIS — Z794 Long term (current) use of insulin: Secondary | ICD-10-CM

## 2019-12-02 DIAGNOSIS — Y832 Surgical operation with anastomosis, bypass or graft as the cause of abnormal reaction of the patient, or of later complication, without mention of misadventure at the time of the procedure: Secondary | ICD-10-CM | POA: Diagnosis present

## 2019-12-02 DIAGNOSIS — T82590A Other mechanical complication of surgically created arteriovenous fistula, initial encounter: Secondary | ICD-10-CM | POA: Diagnosis not present

## 2019-12-02 DIAGNOSIS — N2581 Secondary hyperparathyroidism of renal origin: Secondary | ICD-10-CM | POA: Diagnosis present

## 2019-12-02 DIAGNOSIS — Z4901 Encounter for fitting and adjustment of extracorporeal dialysis catheter: Secondary | ICD-10-CM | POA: Diagnosis not present

## 2019-12-02 DIAGNOSIS — E8889 Other specified metabolic disorders: Secondary | ICD-10-CM | POA: Diagnosis present

## 2019-12-02 DIAGNOSIS — Z20822 Contact with and (suspected) exposure to covid-19: Secondary | ICD-10-CM | POA: Diagnosis not present

## 2019-12-02 DIAGNOSIS — Z8616 Personal history of COVID-19: Secondary | ICD-10-CM | POA: Diagnosis not present

## 2019-12-02 DIAGNOSIS — J90 Pleural effusion, not elsewhere classified: Secondary | ICD-10-CM | POA: Diagnosis not present

## 2019-12-02 DIAGNOSIS — Z8673 Personal history of transient ischemic attack (TIA), and cerebral infarction without residual deficits: Secondary | ICD-10-CM | POA: Diagnosis not present

## 2019-12-02 DIAGNOSIS — J9601 Acute respiratory failure with hypoxia: Secondary | ICD-10-CM | POA: Diagnosis not present

## 2019-12-02 DIAGNOSIS — Z8249 Family history of ischemic heart disease and other diseases of the circulatory system: Secondary | ICD-10-CM

## 2019-12-02 DIAGNOSIS — D631 Anemia in chronic kidney disease: Secondary | ICD-10-CM | POA: Diagnosis present

## 2019-12-02 DIAGNOSIS — E875 Hyperkalemia: Secondary | ICD-10-CM | POA: Diagnosis present

## 2019-12-02 DIAGNOSIS — Z6831 Body mass index (BMI) 31.0-31.9, adult: Secondary | ICD-10-CM

## 2019-12-02 DIAGNOSIS — J9690 Respiratory failure, unspecified, unspecified whether with hypoxia or hypercapnia: Secondary | ICD-10-CM | POA: Diagnosis present

## 2019-12-02 DIAGNOSIS — Z7189 Other specified counseling: Secondary | ICD-10-CM

## 2019-12-02 DIAGNOSIS — I10 Essential (primary) hypertension: Secondary | ICD-10-CM | POA: Diagnosis not present

## 2019-12-02 DIAGNOSIS — E669 Obesity, unspecified: Secondary | ICD-10-CM | POA: Diagnosis present

## 2019-12-02 DIAGNOSIS — E1129 Type 2 diabetes mellitus with other diabetic kidney complication: Secondary | ICD-10-CM | POA: Diagnosis not present

## 2019-12-02 DIAGNOSIS — I129 Hypertensive chronic kidney disease with stage 1 through stage 4 chronic kidney disease, or unspecified chronic kidney disease: Secondary | ICD-10-CM | POA: Diagnosis not present

## 2019-12-02 DIAGNOSIS — Z79899 Other long term (current) drug therapy: Secondary | ICD-10-CM

## 2019-12-02 DIAGNOSIS — Z9115 Patient's noncompliance with renal dialysis: Secondary | ICD-10-CM

## 2019-12-02 DIAGNOSIS — I728 Aneurysm of other specified arteries: Secondary | ICD-10-CM | POA: Diagnosis present

## 2019-12-02 DIAGNOSIS — Z992 Dependence on renal dialysis: Secondary | ICD-10-CM

## 2019-12-02 DIAGNOSIS — Z4659 Encounter for fitting and adjustment of other gastrointestinal appliance and device: Secondary | ICD-10-CM

## 2019-12-02 DIAGNOSIS — E8770 Fluid overload, unspecified: Secondary | ICD-10-CM | POA: Diagnosis not present

## 2019-12-02 DIAGNOSIS — R069 Unspecified abnormalities of breathing: Secondary | ICD-10-CM | POA: Diagnosis not present

## 2019-12-02 DIAGNOSIS — Z9119 Patient's noncompliance with other medical treatment and regimen: Secondary | ICD-10-CM | POA: Diagnosis not present

## 2019-12-02 DIAGNOSIS — Z4682 Encounter for fitting and adjustment of non-vascular catheter: Secondary | ICD-10-CM | POA: Diagnosis not present

## 2019-12-02 DIAGNOSIS — R0902 Hypoxemia: Secondary | ICD-10-CM | POA: Diagnosis not present

## 2019-12-02 DIAGNOSIS — N186 End stage renal disease: Secondary | ICD-10-CM | POA: Diagnosis not present

## 2019-12-02 DIAGNOSIS — J80 Acute respiratory distress syndrome: Secondary | ICD-10-CM | POA: Diagnosis not present

## 2019-12-02 DIAGNOSIS — J811 Chronic pulmonary edema: Secondary | ICD-10-CM | POA: Diagnosis present

## 2019-12-02 DIAGNOSIS — J1282 Pneumonia due to coronavirus disease 2019: Secondary | ICD-10-CM | POA: Diagnosis present

## 2019-12-02 DIAGNOSIS — Z515 Encounter for palliative care: Secondary | ICD-10-CM | POA: Diagnosis not present

## 2019-12-02 DIAGNOSIS — R231 Pallor: Secondary | ICD-10-CM | POA: Diagnosis not present

## 2019-12-02 LAB — POCT I-STAT EG7
Acid-base deficit: 6 mmol/L — ABNORMAL HIGH (ref 0.0–2.0)
Bicarbonate: 18.2 mmol/L — ABNORMAL LOW (ref 20.0–28.0)
Calcium, Ion: 1.03 mmol/L — ABNORMAL LOW (ref 1.15–1.40)
HCT: 25 % — ABNORMAL LOW (ref 39.0–52.0)
Hemoglobin: 8.5 g/dL — ABNORMAL LOW (ref 13.0–17.0)
O2 Saturation: 99 %
Potassium: 5.7 mmol/L — ABNORMAL HIGH (ref 3.5–5.1)
Sodium: 136 mmol/L (ref 135–145)
TCO2: 19 mmol/L — ABNORMAL LOW (ref 22–32)
pCO2, Ven: 29.9 mmHg — ABNORMAL LOW (ref 44.0–60.0)
pH, Ven: 7.393 (ref 7.250–7.430)
pO2, Ven: 145 mmHg — ABNORMAL HIGH (ref 32.0–45.0)

## 2019-12-02 LAB — POCT I-STAT 7, (LYTES, BLD GAS, ICA,H+H)
Acid-base deficit: 3 mmol/L — ABNORMAL HIGH (ref 0.0–2.0)
Bicarbonate: 23.3 mmol/L (ref 20.0–28.0)
Calcium, Ion: 1.09 mmol/L — ABNORMAL LOW (ref 1.15–1.40)
HCT: 31 % — ABNORMAL LOW (ref 39.0–52.0)
Hemoglobin: 10.5 g/dL — ABNORMAL LOW (ref 13.0–17.0)
O2 Saturation: 100 %
Potassium: 5.6 mmol/L — ABNORMAL HIGH (ref 3.5–5.1)
Sodium: 136 mmol/L (ref 135–145)
TCO2: 25 mmol/L (ref 22–32)
pCO2 arterial: 48.6 mmHg — ABNORMAL HIGH (ref 32.0–48.0)
pH, Arterial: 7.288 — ABNORMAL LOW (ref 7.350–7.450)
pO2, Arterial: 371 mmHg — ABNORMAL HIGH (ref 83.0–108.0)

## 2019-12-02 LAB — COMPREHENSIVE METABOLIC PANEL
ALT: 16 U/L (ref 0–44)
ALT: 15 U/L (ref 0–44)
AST: 18 U/L (ref 15–41)
AST: 20 U/L (ref 15–41)
Albumin: 3 g/dL — ABNORMAL LOW (ref 3.5–5.0)
Albumin: 3.1 g/dL — ABNORMAL LOW (ref 3.5–5.0)
Alkaline Phosphatase: 46 U/L (ref 38–126)
Alkaline Phosphatase: 48 U/L (ref 38–126)
Anion gap: 26 — ABNORMAL HIGH (ref 5–15)
Anion gap: 23 — ABNORMAL HIGH (ref 5–15)
BUN: 127 mg/dL — ABNORMAL HIGH (ref 6–20)
BUN: 127 mg/dL — ABNORMAL HIGH (ref 6–20)
CO2: 16 mmol/L — ABNORMAL LOW (ref 22–32)
CO2: 20 mmol/L — ABNORMAL LOW (ref 22–32)
Calcium: 8.8 mg/dL — ABNORMAL LOW (ref 8.9–10.3)
Calcium: 9.1 mg/dL (ref 8.9–10.3)
Chloride: 97 mmol/L — ABNORMAL LOW (ref 98–111)
Chloride: 96 mmol/L — ABNORMAL LOW (ref 98–111)
Creatinine, Ser: 29.15 mg/dL — ABNORMAL HIGH (ref 0.61–1.24)
Creatinine, Ser: 29.42 mg/dL — ABNORMAL HIGH (ref 0.61–1.24)
GFR calc Af Amer: 2 mL/min — ABNORMAL LOW (ref >60)
GFR calc Af Amer: 2 mL/min — ABNORMAL LOW (ref >60)
GFR calc non Af Amer: 2 mL/min — ABNORMAL LOW (ref >60)
GFR calc non Af Amer: 2 mL/min — ABNORMAL LOW (ref >60)
Glucose, Bld: 99 mg/dL (ref 70–99)
Glucose, Bld: 117 mg/dL — ABNORMAL HIGH (ref 70–99)
Potassium: 6 mmol/L — ABNORMAL HIGH (ref 3.5–5.1)
Potassium: 5.7 mmol/L — ABNORMAL HIGH (ref 3.5–5.1)
Sodium: 139 mmol/L (ref 135–145)
Sodium: 139 mmol/L (ref 135–145)
Total Bilirubin: 2.3 mg/dL — ABNORMAL HIGH (ref 0.3–1.2)
Total Bilirubin: 2.2 mg/dL — ABNORMAL HIGH (ref 0.3–1.2)
Total Protein: 7.4 g/dL (ref 6.5–8.1)
Total Protein: 8 g/dL (ref 6.5–8.1)

## 2019-12-02 LAB — CORTISOL: Cortisol, Plasma: 20.9 ug/dL

## 2019-12-02 LAB — CBC
HCT: 26.1 % — ABNORMAL LOW (ref 39.0–52.0)
HCT: 27.5 % — ABNORMAL LOW (ref 39.0–52.0)
Hemoglobin: 8.1 g/dL — ABNORMAL LOW (ref 13.0–17.0)
Hemoglobin: 8.7 g/dL — ABNORMAL LOW (ref 13.0–17.0)
MCH: 28.6 pg (ref 26.0–34.0)
MCH: 29 pg (ref 26.0–34.0)
MCHC: 31 g/dL (ref 30.0–36.0)
MCHC: 31.6 g/dL (ref 30.0–36.0)
MCV: 92.2 fL (ref 80.0–100.0)
MCV: 91.7 fL (ref 80.0–100.0)
Platelets: 218 10*3/uL (ref 150–400)
Platelets: 250 10*3/uL (ref 150–400)
RBC: 2.83 MIL/uL — ABNORMAL LOW (ref 4.22–5.81)
RBC: 3 MIL/uL — ABNORMAL LOW (ref 4.22–5.81)
RDW: 16.1 % — ABNORMAL HIGH (ref 11.5–15.5)
RDW: 16.1 % — ABNORMAL HIGH (ref 11.5–15.5)
WBC: 8.1 10*3/uL (ref 4.0–10.5)
WBC: 13.1 10*3/uL — ABNORMAL HIGH (ref 4.0–10.5)
nRBC: 0 % (ref 0.0–0.2)
nRBC: 0 % (ref 0.0–0.2)

## 2019-12-02 LAB — APTT: aPTT: 44 seconds — ABNORMAL HIGH (ref 24–36)

## 2019-12-02 LAB — TROPONIN I (HIGH SENSITIVITY)
Troponin I (High Sensitivity): 995 ng/L (ref <18)
Troponin I (High Sensitivity): 978 ng/L (ref <18)

## 2019-12-02 LAB — TRIGLYCERIDES: Triglycerides: 204 mg/dL — ABNORMAL HIGH (ref <150)

## 2019-12-02 LAB — LIPASE, BLOOD: Lipase: 55 U/L — ABNORMAL HIGH (ref 11–51)

## 2019-12-02 LAB — AMYLASE: Amylase: 129 U/L — ABNORMAL HIGH (ref 28–100)

## 2019-12-02 LAB — PROTIME-INR
INR: 1.3 — ABNORMAL HIGH (ref 0.8–1.2)
Prothrombin Time: 15.7 seconds — ABNORMAL HIGH (ref 11.4–15.2)

## 2019-12-02 LAB — LACTIC ACID, PLASMA
Lactic Acid, Venous: 1 mmol/L (ref 0.5–1.9)
Lactic Acid, Venous: 1.2 mmol/L (ref 0.5–1.9)

## 2019-12-02 LAB — CBG MONITORING, ED: Glucose-Capillary: 87 mg/dL (ref 70–99)

## 2019-12-02 LAB — PROCALCITONIN: Procalcitonin: 1.04 ng/mL

## 2019-12-02 LAB — BRAIN NATRIURETIC PEPTIDE: B Natriuretic Peptide: >4500 pg/mL — ABNORMAL HIGH (ref 0.0–100.0)

## 2019-12-02 LAB — MAGNESIUM: Magnesium: 3.1 mg/dL — ABNORMAL HIGH (ref 1.7–2.4)

## 2019-12-02 LAB — PHOSPHORUS: Phosphorus: 11.7 mg/dL — ABNORMAL HIGH (ref 2.5–4.6)

## 2019-12-02 LAB — POC SARS CORONAVIRUS 2 AG -  ED: SARS Coronavirus 2 Ag: NEGATIVE

## 2019-12-02 MED ORDER — LIDOCAINE-PRILOCAINE 2.5-2.5 % EX CREA
1.0000 "application " | TOPICAL_CREAM | CUTANEOUS | Status: DC | PRN
  Filled 2019-12-02: qty 5

## 2019-12-02 MED ORDER — DEXTROSE 50 % IV SOLN
1.0000 | Freq: Once | INTRAVENOUS | Status: AC
  Administered 2019-12-02: 50 mL via INTRAVENOUS
  Filled 2019-12-02: qty 50

## 2019-12-02 MED ORDER — CHLORHEXIDINE GLUCONATE 0.12% ORAL RINSE (MEDLINE KIT)
15.0000 mL | Freq: Two times a day (BID) | OROMUCOSAL | Status: DC
  Administered 2019-12-02 – 2019-12-08 (×9): 15 mL via OROMUCOSAL

## 2019-12-02 MED ORDER — MIDAZOLAM HCL 2 MG/2ML IJ SOLN
4.0000 mg | Freq: Once | INTRAMUSCULAR | Status: AC
  Administered 2019-12-02: 4 mg via INTRAVENOUS

## 2019-12-02 MED ORDER — NITROGLYCERIN 0.4 MG SL SUBL
0.4000 mg | SUBLINGUAL_TABLET | Freq: Once | SUBLINGUAL | Status: AC
  Administered 2019-12-02: 0.4 mg via SUBLINGUAL
  Filled 2019-12-02: qty 1

## 2019-12-02 MED ORDER — FENTANYL 2500MCG IN NS 250ML (10MCG/ML) PREMIX INFUSION
0.0000 ug/h | INTRAVENOUS | Status: DC
  Administered 2019-12-02 – 2019-12-03 (×2): 100 ug/h via INTRAVENOUS
  Filled 2019-12-02 (×2): qty 250

## 2019-12-02 MED ORDER — MIDAZOLAM HCL 2 MG/2ML IJ SOLN: 2.0000 mg | Freq: Once | INTRAMUSCULAR | Status: AC

## 2019-12-02 MED ORDER — CHLORHEXIDINE GLUCONATE CLOTH 2 % EX PADS
6.0000 | MEDICATED_PAD | Freq: Every day | CUTANEOUS | Status: DC
  Administered 2019-12-02 – 2019-12-08 (×4): 6 via TOPICAL

## 2019-12-02 MED ORDER — DEXAMETHASONE SODIUM PHOSPHATE 10 MG/ML IJ SOLN
6.0000 mg | Freq: Once | INTRAMUSCULAR | Status: AC
  Administered 2019-12-02: 10:00:00 6 mg via INTRAVENOUS
  Filled 2019-12-02: qty 1

## 2019-12-02 MED ORDER — PROPOFOL 1000 MG/100ML IV EMUL
5.0000 ug/kg/min | INTRAVENOUS | Status: DC
  Administered 2019-12-02: 14:00:00 5 ug/kg/min via INTRAVENOUS
  Administered 2019-12-03 (×2): 20 ug/kg/min via INTRAVENOUS
  Filled 2019-12-02 (×4): qty 100

## 2019-12-02 MED ORDER — FENTANYL CITRATE (PF) 100 MCG/2ML IJ SOLN
100.0000 ug | Freq: Once | INTRAMUSCULAR | Status: AC
  Administered 2019-12-02: 100 ug via INTRAVENOUS

## 2019-12-02 MED ORDER — MIDAZOLAM HCL 2 MG/2ML IJ SOLN: 2.0000 mg | INTRAMUSCULAR | Status: DC | PRN

## 2019-12-02 MED ORDER — ALTEPLASE 2 MG IJ SOLR
2.0000 mg | Freq: Once | INTRAMUSCULAR | Status: DC | PRN
  Filled 2019-12-02: qty 2

## 2019-12-02 MED ORDER — LIDOCAINE HCL (PF) 1 % IJ SOLN: 5.0000 mL | INTRAMUSCULAR | Status: DC | PRN

## 2019-12-02 MED ORDER — SODIUM CHLORIDE 0.9 % IV SOLN: INTRAVENOUS | Status: DC

## 2019-12-02 MED ORDER — PANTOPRAZOLE SODIUM 40 MG IV SOLR
40.0000 mg | Freq: Every day | INTRAVENOUS | Status: DC
  Administered 2019-12-02 – 2019-12-03 (×2): 40 mg via INTRAVENOUS
  Filled 2019-12-02 (×2): qty 40

## 2019-12-02 MED ORDER — ROCURONIUM BROMIDE 50 MG/5ML IV SOLN
100.0000 mg | Freq: Once | INTRAVENOUS | Status: AC
  Administered 2019-12-02: 100 mg via INTRAVENOUS
  Filled 2019-12-02: qty 10

## 2019-12-02 MED ORDER — PENTAFLUOROPROP-TETRAFLUOROETH EX AERO: 1.0000 "application " | INHALATION_SPRAY | CUTANEOUS | Status: DC | PRN

## 2019-12-02 MED ORDER — ETOMIDATE 2 MG/ML IV SOLN
20.0000 mg | Freq: Once | INTRAVENOUS | Status: AC
  Administered 2019-12-02: 40 mg via INTRAVENOUS

## 2019-12-02 MED ORDER — SODIUM CHLORIDE 0.9 % IV SOLN: 100.0000 mL | INTRAVENOUS | Status: DC | PRN

## 2019-12-02 MED ORDER — ORAL CARE MOUTH RINSE
15.0000 mL | OROMUCOSAL | Status: DC
  Administered 2019-12-02 – 2019-12-03 (×9): 15 mL via OROMUCOSAL

## 2019-12-02 MED ORDER — FENTANYL CITRATE (PF) 100 MCG/2ML IJ SOLN
INTRAMUSCULAR | Status: AC
  Filled 2019-12-02: qty 2

## 2019-12-02 MED ORDER — INSULIN ASPART 100 UNIT/ML IV SOLN
5.0000 [IU] | Freq: Once | INTRAVENOUS | Status: AC
  Administered 2019-12-02: 5 [IU] via INTRAVENOUS

## 2019-12-02 MED ORDER — MIDAZOLAM HCL 2 MG/2ML IJ SOLN
INTRAMUSCULAR | Status: AC
  Filled 2019-12-02: qty 4

## 2019-12-02 MED ORDER — SODIUM BICARBONATE 8.4 % IV SOLN
50.0000 meq | Freq: Once | INTRAVENOUS | Status: AC
  Administered 2019-12-02: 50 meq via INTRAVENOUS
  Filled 2019-12-02: qty 50

## 2019-12-02 MED ORDER — HEPARIN SODIUM (PORCINE) 1000 UNIT/ML IJ SOLN
INTRAMUSCULAR | Status: AC
  Administered 2019-12-02: 12:00:00 3000 [IU]
  Filled 2019-12-02: qty 4

## 2019-12-02 MED ORDER — HEPARIN SODIUM (PORCINE) 1000 UNIT/ML DIALYSIS
1000.0000 [IU] | INTRAMUSCULAR | Status: DC | PRN
  Administered 2019-12-09: 08:00:00 3000 [IU] via INTRAVENOUS_CENTRAL

## 2019-12-02 MED ORDER — MIDAZOLAM HCL 2 MG/2ML IJ SOLN
INTRAMUSCULAR | Status: AC
  Administered 2019-12-02: 13:00:00 2 mg via INTRAVENOUS
  Filled 2019-12-02: qty 2

## 2019-12-02 MED ORDER — ROCURONIUM BROMIDE 50 MG/5ML IV SOLN: 100.0000 mg | Freq: Once | INTRAVENOUS | Status: DC

## 2019-12-02 NOTE — Consult Note (Addendum)
[facility] Renal Consultation Note  Requesting MD: Gerlene Fee, MD Indication for Consultation:  ESRD  Chief complaint: shortness of breath   HPI:  Patrick Ibarra is a 39 y.o. male with a history of end-stage renal disease recently grossly noncompliant with dialysis, hypertension, and diabetes mellitus and recent hospital admission with covid who presented to the hospital with shortness of breath and swelling.  Came in on NRB with sat 72% per fire department.  He states that this has been progressive over several days.  When he was here last admission he tested positive for Covid on 3/16.  He was discharged on 3/20.  During that admission he received dialysis via a nontunneled dialysis catheter placed by critical care which was removed at discharge.  He had previously hoped for a holistic approach which we discussed would not be compatible with life.  In the past he has refused permanent access and has a prior nonfunctional upper left arm access which he states has not been used for months.  He does not have a current outpatient dialysis unit.  He states he previously went to [facility].  Last HD was here on 3/20.  The patient and I had a frank discussion that emergent dialysis on recurrent occasions is not appropriate and that if he did want to continue with dialysis he would need to have a permanent access placed (a tunneled catheter when more stable) and would need to agree to do dialysis 3 times a week.  We discussed that if he was not willing to commit to routine dialysis that it would be appropriate to consult palliative care.  He does wish to live and wants to continue with dialysis.  Spoke with the ER attending and he feels that ICU is most appropriate.   PMHx:   Past Medical History:  Diagnosis Date  . Chronic kidney disease    dialysis M-W-F  . Diabetes mellitus without complication (HCC)    type 2, diet controlled  . Hypertension   . LV dysfunction   . Obesity      Past Surgical History:  Procedure Laterality Date  . AV FISTULA PLACEMENT Left 04/10/2017   Procedure: ARTERIOVENOUS (AV) FISTULA CREATION LEFT ARM;  Surgeon: Conrad Chapin, MD;  Location: [place];  Service: Vascular;  Laterality: Left;  . ESOPHAGOGASTRODUODENOSCOPY (EGD) WITH PROPOFOL N/A 04/04/2017   Procedure: ESOPHAGOGASTRODUODENOSCOPY (EGD) WITH PROPOFOL;  Surgeon: Carol Ada, MD;  Location: [place];  Service: Endoscopy;  Laterality: N/A;  . 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 INSERTION OF DIALYSIS CATHETER Right 04/10/2017   Procedure: INSERTION OF DIALYSIS CATHETER - RIGHT INTERNAL JUGULAR PLACEMENT;  Surgeon: Conrad Dorado, MD;  Location: [place];  Service: Vascular;  Laterality: Right;  . 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 TONSILLECTOMY AND ADENOIDECTOMY      Family Hx:  Family History  Problem Relation Age of Onset  . Hypertension Mother   . Diabetes Mother   . Hypertension Father     Social History:  reports that he has never smoked. He has never used smokeless tobacco. He reports  previous alcohol use. He reports that he does not use drugs.  Allergies: No Known Allergies  Medications: Prior to Admission medications   Medication Sig Start Date End Date Taking? Authorizing Provider  Accu-Chek FastClix Lancets MISC Use as instructed to check blood sugar once daily. E11.9 06/17/19   Ladell Pier, MD  acetaminophen (TYLENOL) 500 MG tablet Take 500-1,000 mg by mouth every 6 (six) hours as needed for mild pain or headache.    [provider]   amLODipine (NORVASC) 10 MG tablet Take 1 tablet (10 mg total) by mouth daily. 06/10/19   Ladell Pier, MD  Blood Glucose Monitoring Suppl (ACCU-CHEK GUIDE ME) w/Device KIT 1 kit by Does not apply route daily. Use as instructed to check blood sugar once daily. E11.9 06/17/19   Ladell Pier, MD  carvedilol (COREG) 12.5 MG tablet Take 1 tablet (12.5 mg total) by mouth 2 (two) times daily with a meal. 11/09/19   Ladell Pier, MD  Ferrous Sulfate (FERROUSUL PO) Take 5 mLs by mouth daily after breakfast.    [provider]  glucose blood (ACCU-CHEK GUIDE) test strip Use as instructed to check blood sugar once daily. E11.9 06/17/19   Ladell Pier, MD  insulin aspart (NOVOLOG FLEXPEN) 100 UNIT/ML FlexPen 2 units subcut with meals for BS greater than 150. Patient taking differently: Inject 2 Units into the skin See admin instructions. Inject 2 units into the skin three times a day with meals for a BGL greater than 150 11/14/19   Ladell Pier, MD  Insulin Pen Needle (PEN NEEDLES) 31G X 8 MM MISC UAD 11/14/19   Ladell Pier, MD  lovastatin (MEVACOR) 20 MG tablet Take 1 tablet (20 mg total) by mouth at bedtime. 06/12/19   Ladell Pier, MD    I have reviewed the patient's current medications.  Poor historian   Labs:  BMP Latest Ref Rng & Units 12/02/2019 11/20/2019 11/19/2019  Glucose 70 - 99 mg/dL - 137(H) 163(H)  BUN 6 - 20 mg/dL - 44(H) 83(H)  Creatinine 0.61 - 1.24 mg/dL - 16.73(H) 27.25(H)  BUN/Creat Ratio 9 - 20 - - -  Sodium 135 - 145 mmol/L 136 136 134(L)  Potassium 3.5 - 5.1 mmol/L 5.7(H) 3.8 4.4  Chloride 98 - 111 mmol/L - 95(L) 91(L)  CO2 22 - 32 mmol/L - 24 21(L)  Calcium 8.9 - 10.3 mg/dL - 8.0(L) 7.9(L)    ROS:  Pertinent items noted in HPI and remainder of comprehensive ROS otherwise negative.  Physical Exam: Vitals:   12/02/19 0834  BP: (!) 185/152  Pulse: (!) 109  Resp: (!) 33  Temp: (!) 97.4 F (36.3 C)  SpO2: 99%     General:  Adult male in bed with increased work of breathing with exertion HEENT: Normocephalic atraumatic Eyes: Extraocular movements intact sclera anicteric Neck: supple trachea midline Heart: Tachycardia S1-S2 no rub Lungs: Reduced breath sounds on auscultation on 6 L of oxygen with sats 93% and labored breathing with speech Abdomen: soft nontender nondistended Extremities: 2-3+ edema bilateral lower extremities Skin: No rash on extremities exposed Neuro: Alert and oriented x3 provides a history and follows commands Psych: alert and oriented; insight appears impaired  Access left arm with out bruit or thrill no catheter is in place  Assessment/Plan:  # Acute hypoxic respiratory failure - Secondary to fluid overload as well as Covid pneumonia - Optimize volume with HD and therapies per primary team - Contacted pulm/critical care to see   #  End-stage renal disease - Await renal panel  - HD once access is available.  Have placed orders and contacted the dialysis unit.  Reduced BF and DF given hx of recent gross noncompliance - Anticipate may need serial HD   - Have consulted pulmonology to place a nontunneled dialysis catheter.  Do not feel that he is stable for a tunneled dialysis catheter at this time.  He and I have discussed that committing to continuing dialysis is necessary.  If he is not willing to continue with hemodialysis it would be appropriate to consult palliative care  # COVID-19 pneumonia - Therapies per primary team  # Hypertension - Optimize volume status with dialysis  # Anemia of CKD - No acute indication for transfusion - When blood pressure better controlled assess ESA  # Noncompliance - He and I have discussed that committing to continuing dialysis is necessary.  If he is not willing to continue with hemodialysis it would be appropriate to consult palliative care  # Secondary hyperpara - Check intact PTH in AM  He is critically ill and I worry about high risk of  readmission and/or mortality given his past noncompliance  Claudia Desanctis 12/02/2019, 10:03 AM   Consulted IR for tunneled catheter.  Optimize volume with HD today and nontunneled catheter is currently in place.  Prior to intubation he stated that tunneled catheter had been recommended and he was willing to have the tunneled catheter.    Claudia Desanctis 12/02/2019  12:58 PM

## 2019-12-02 NOTE — Procedures (Signed)
Hemodialysis Insertion Procedure Note TEMITOPE GRIFFING [id number] 1980-10-12  Procedure: Insertion of Hemodialysis Catheter Type: 3 port  Indications: Hemodialysis   Procedure Details Consent: Risks of procedure as well as the alternatives and risks of each were explained to the (patient/caregiver).  Consent for procedure obtained. Time Out: Verified patient identification, verified procedure, site/side was marked, verified correct patient position, special equipment/implants available, medications/allergies/relevent history reviewed, required imaging and test results available.  Performed  Maximum sterile technique was used including antiseptics, cap, gloves, gown, hand hygiene, mask and sheet. Skin prep: Chlorhexidine; local anesthetic administered A antimicrobial bonded/coated triple lumen catheter was placed in the left femoral vein due to emergent situation using the Seldinger technique. Ultrasound guidance used.Yes.   Catheter placed to 24 cm. Blood aspirated via all 3 ports and then flushed x 3. Line sutured x 2 and dressing applied.  Evaluation Blood flow good Complications: No apparent complications Patient did tolerate procedure well. Chest X-ray ordered to verify placement.  CXR: not needed  Richardson Landry Tyren Dugar ACNP Acute Care Nurse Practitioner [place] Please consult Amion 12/02/2019, 12:16 PM

## 2019-12-02 NOTE — Procedures (Signed)
Intubation Procedure Note Patrick Ibarra [id number] 1981-04-19  Procedure: Intubation Indications: Respiratory insufficiency  Procedure Details Consent: Risks of procedure as well as the alternatives and risks of each were explained to the (patient/caregiver).  Consent for procedure obtained. Time Out: Verified patient identification, verified procedure, site/side was marked, verified correct patient position, special equipment/implants available, medications/allergies/relevent history reviewed, required imaging and test results available.  Performed  Maximum sterile technique was used including gloves, gown, hand hygiene and mask.  MAC and 4    Evaluation Hemodynamic Status: BP stable throughout; O2 sats: stable throughout Patient's Current Condition: stable Complications: No apparent complications Patient did tolerate procedure well. Chest X-ray ordered to verify placement.  CXR: pending.   Jesse Sans 12/02/2019

## 2019-12-02 NOTE — H&P (Signed)
PULMONARY / CRITICAL CARE MEDICINE   NAME:  Patrick Ibarra, MRN:  [id number], DOB:  05-Dec-1980, LOS: 0 ADMISSION DATE:  12/02/2019, CONSULTATION DATE: 12/02/2019 REFERRING MD: Emergency department physician, CHIEF COMPLAINT: Acute respiratory failure in the setting of volume overload from noncompliance with end-stage renal disease and COVID-19  BRIEF HISTORY:    39 year old with end-stage renal disease and noncompliance with dialysis COVID-19 positive presents with acute respiratory distress and unable to lay flat. HISTORY OF PRESENT ILLNESS   39 year old male with a history of end-stage renal disease has been noncompliant in the past despite having hemodialysis catheter placed temporarily subsequently has been removed and signed out.  Last time was 11/16/2019 at which time he was also Covid positive. 12/02/2018 when he represents emergency department increasing shortness of breath with volume overload still in a Covid positive window with difficulty breathing.  Pulmonary critical care was asked to evaluate for dialysis catheter placement but he is unable to lay flat he is tachypneic at rest and with using abdominal  paradoxical chest wall movement. He will be admitted to the intensive care unit to the Covid unit at which time we will be evaluated for intubation and placement of a central line hemodialysis catheter. There is been multiple discussions concerning palliation although he is only 39 years old and has been grossly noncompliant with dialysis. Denies substance abuse and smoking.  He is in obvious respiratory distress at the time of this examination. SIGNIFICANT PAST MEDICAL HISTORY   End-stage renal disease with noncompliance Hypertension COVID-19 diagnosed 11/16/2019 Obesity Diabetes mellitus Noncompliance  SIGNIFICANT EVENTS:   STUDIES:    CULTURES:  11/16/2019 Covid positive  ANTIBIOTICS:    LINES/TUBES:    CONSULTANTS:  12/02/2019 renal SUBJECTIVE:  39 year old male who is in  obvious respiratory distress with poor insight into his condition.  CONSTITUTIONAL: BP (!) 175/107   Pulse (!) 109   Temp (!) 97.4 F (36.3 C) (Oral)   Resp (!) 23   SpO2 95%   No intake/output data recorded.        PHYSICAL EXAM: General: Young male in obvious respiratory distress Neuro: Grossly intact but has a strange affect HEENT: Positive JVD Cardiovascular: Heart sounds are regular regular rate and rhythm Lungs: Coarse rhonchi with bilateral crackles Abdomen: Soft nontender Musculoskeletal: No edema Skin: Warm dry multiple tattoos and scars                                                                                      RESOLVED PROBLEM LIST   ASSESSMENT AND PLAN   Acute respiratory distress in the setting of COVID-19 coupled with gross volume overload from noncompliance with dialysis.  He is in obvious respiratory distress cannot lay flat for any type of procedure. Admit to the intensive care unit O2 as needed Continue Decadron Most likely need intubation Placement of hemodialysis catheter to relieve volume overload. If able to pull fluid may give consideration to early extubation to prevent the effects of positive pressure ventilation on Covid patients.  End-stage renal disease Nephrology is following HD catheter placement Pull for negative Will need tunnel cath in the future AV graft in near future  DM  CBG (last 3)  Recent Labs    12/02/19 1021  GLUCAP 87  Scale insulin protocol  Hypertension Antihypertensives as needed    SUMMARY OF TODAY'S PLAN:  39 year old male with a history of end-stage renal disease has been noncompliant in the past despite having hemodialysis catheter placed temporarily subsequently has been removed and signed out.  Last time was 11/16/2019 at which time he was also Covid positive. 12/02/2018 when he represents emergency department increasing shortness of breath with volume overload still in a Covid positive window with  difficulty breathing.  Pulmonary critical care was asked to evaluate for dialysis catheter placement but he is unable to lay flat he is tachypneic at rest and with using abdominal  paradoxical chest wall movement. He will be admitted to the intensive care unit to the Covid unit at which time we will be evaluated for intubation and placement of a central line hemodialysis catheter. There is been multiple discussions concerning palliation although he is only 39 years old and has been grossly noncompliant with dialysis. Denies substance abuse and smoking.  He is in obvious respiratory distress at the time of this examination.  Best Practice / Goals of Care / Disposition.   DVT PROPHYLAXIS: Pneumatic stockings SUP: PPI NUTRITION: N.p.o. MOBILITY: Bedrest GOALS OF CARE: Full code FAMILY DISCUSSIONS: 12/02/2019 patient updated at bedside and requested full CODE STATUS intubation if needed in hemodialysis catheter DISPOSITION admit to the intensive care unit  LABS  Glucose Recent Labs  Lab 12/02/19 1021  GLUCAP 87    BMET Recent Labs  Lab 12/02/19 0849 12/02/19 0904  NA 139 136  K 6.0* 5.7*  CL 97*  --   CO2 16*  --   BUN 127*  --   CREATININE 29.15*  --   GLUCOSE 99  --     Liver Enzymes Recent Labs  Lab 12/02/19 0849  AST 18  ALT 16  ALKPHOS 46  BILITOT 2.3*  ALBUMIN 3.0*    Electrolytes Recent Labs  Lab 12/02/19 0849  CALCIUM 8.8*    CBC Recent Labs  Lab 12/02/19 0849 12/02/19 0904  WBC 8.1  --   HGB 8.1* 8.5*  HCT 26.1* 25.0*  PLT 218  --     ABG No results for input(s): PHART, PCO2ART, PO2ART in the last 168 hours.  Coag's No results for input(s): APTT, INR in the last 168 hours.  Sepsis Markers Recent Labs  Lab 12/02/19 0849  LATICACIDVEN 1.0    Cardiac Enzymes No results for input(s): TROPONINI, PROBNP in the last 168 hours.  PAST MEDICAL HISTORY :   He  has a past medical history of Chronic kidney disease, Diabetes mellitus without  complication ([place]), Hypertension, LV dysfunction, and Obesity.  PAST SURGICAL HISTORY:  He  has a past surgical history that includes Tonsillectomy and adenoidectomy; Esophagogastroduodenoscopy (egd) with propofol (N/A, 04/04/2017); AV fistula placement (Left, 04/10/2017); Insertion of dialysis catheter (Right, 04/10/2017); Fistula superficialization (Left, 06/22/2019); and Thrombectomy and revision of arterioventous (av) goretex  graft (Left, 09/26/2019).                                                                                  No  Known Allergies  No current facility-administered medications on file prior to encounter.   Current Outpatient Medications on File Prior to Encounter  Medication Sig  . Accu-Chek FastClix Lancets MISC Use as instructed to check blood sugar once daily. E11.9  . acetaminophen (TYLENOL) 500 MG tablet Take 500-1,000 mg by mouth every 6 (six) hours as needed for mild pain or headache.  Marland Kitchen amLODipine (NORVASC) 10 MG tablet Take 1 tablet (10 mg total) by mouth daily.  . Blood Glucose Monitoring Suppl (ACCU-CHEK GUIDE ME) w/Device KIT 1 kit by Does not apply route daily. Use as instructed to check blood sugar once daily. E11.9  . carvedilol (COREG) 12.5 MG tablet Take 1 tablet (12.5 mg total) by mouth 2 (two) times daily with a meal.  . Ferrous Sulfate (FERROUSUL PO) Take 5 mLs by mouth daily after breakfast.  . glucose blood (ACCU-CHEK GUIDE) test strip Use as instructed to check blood sugar once daily. E11.9  . insulin aspart (NOVOLOG FLEXPEN) 100 UNIT/ML FlexPen 2 units subcut with meals for BS greater than 150. (Patient taking differently: Inject 2 Units into the skin See admin instructions. Inject 2 units into the skin three times a day with meals for a BGL greater than 150)  . Insulin Pen Needle (PEN NEEDLES) 31G X 8 MM MISC UAD  . lovastatin (MEVACOR) 20 MG tablet Take 1 tablet (20 mg total) by mouth at bedtime.    FAMILY HISTORY:   His family history includes  Diabetes in his mother; Hypertension in his father and mother.  SOCIAL HISTORY:  He  reports that he has never smoked. He has never used smokeless tobacco. He reports previous alcohol use. He reports that he does not use drugs.  REVIEW OF SYSTEMS:    10 point review of system taken, please see HPI for positives and negatives.   App cct 61 min  Richardson Landry Emillee Talsma ACNP Acute Care Nurse Practitioner [place] Please consult Amion 12/02/2019, 10:49 AM

## 2019-12-02 NOTE — Patient Outreach (Signed)
[facility]) Care Management  12/02/2019  Patrick Ibarra [birth year] [id number]  CSW was able to make initial contact with patient's wife, Kele Barthelemy today to perform the phone assessment on patient, as well as assess and assist with social work needs and services.  CSW introduced self, explained role and types of services provided through [facility] ([facility]).  CSW further explained to Mrs. Shurtz that CSW works with patient's Geriatric Nurse Practitioner, also with [facility], Deloria Lair.  CSW then explained the reason for the call, indicating that Ms. Spinks thought that patient would benefit from social work services and resources to assist with getting patient's insurance coverage reinstated.  Ms. [name] also requested that CSW assist patient with obtaining ongoing dialysis treatments at a nearby [place].  CSW obtained two HIPAA compliant identifiers from Mrs. Lia Hopping, which included patient's name and date of birth.  CSW had a very difficult time trying to discern which type of medical coverage patient actually has, if any, as Mrs. Comella was unclear, vacillating between Medicare and Medicaid.  To the best of CSW's knowledge and understanding, patient was receiving Social Security Disability, through [employer], Traditional Medicare, Parts A and B and Adult Medicaid, through the [place], up until July 30, 2019.  On that date, Mrs. Confer reported receiving a letter from [employer] indicating that patient would no longer be receiving a Disability check each month, and that patient owed IT trainer $9,800.00, due to overpayment.  Social Security Administration explained in the letter to patient, that in April of 2020, patient began exceeding the poverty guidelines to be eligible for Disability when Mrs. Scow began working a  full-time job.  Mrs. Cozart believes that patient also lost his Medicare coverage, through [employer], as well as his Medicaid coverage, through the [place], at that same time.  Due to patient not having insurance coverage, patient was forced to stop receiving dialysis treatments three days a week, unable to afford to private pay for these services.  Prior to patient's current hospitalization, Mrs. Keefe reported that patient has not received dialysis treatment for at least a month, maybe more.  CSW explained to Mrs. Inabinet that it sounds as though patient's Medicaid coverage just lapsed, and that she needs to re-apply for coverage.  Mrs. Fester voiced understanding, admitting that she already has a Medicaid application, she just needs to fill it out and send it in for processing.  Mrs. Lasch denied needing assistance with completion of the application, reporting that she handles all of patient's affairs and has been for many years, having completed all of his previous applications for coverage.  CSW agreed to contact patient's Case Worker at [employer], [employer] [phone].pina@ssa .gov) to try and gain a better understanding of why patient no longer receives Traditional Medicare and Social Security Disability.  CSW also wants to find out why patient and Mrs. Neilan are responsible for paying Social Security Administration $9,800.00 for fiscal year 2020.  CSW agreed to follow-up with Mrs. Stracener as soon as CSW has information to report.  Otherwise, CSW will make arrangements to contact Mrs. Bonnell again on Friday, December 10, 2019 to determine whether or not she has had an opportunity to submit patient's completed Medicaid application to the [place] for processing.  CSW is aware that patient currently resides in the Intensive Care  Unit at [facility] due to Acute Respiratory Failure/Distress, Volume  Overload from Noncompliance with End-Stage Renal Disease and COVID-19.  Patient underwent placement of a hemodialysis catheter today, agreeing to adhere to ongoing dialysis treatments three days per week, moving forward.  Nat Christen, BSW, MSW, LCSW  Licensed Education officer, [facility]  Mailing [place] [address], [place], [place] [address] Physical Address-[address], [place], [place] [address] Toll Free Main # [phone] Fax # [phone] Cell # [phone]  Office # [phone] [email or web address]

## 2019-12-02 NOTE — Telephone Encounter (Signed)
Contacted pt wife to go over Dr. Wynetta Emery message pt wife states she called EMS yesterday and pt is currently in the hospital. Made aware that I will keep pt on the schedule for Monday just in case pt gets discharged over the weekend and if he still admitted Monday I will cancel appt. Pt wife states she understands and doesn't have any questions or concerns

## 2019-12-02 NOTE — ED Provider Notes (Signed)
[facility] Emergency Department Provider Note MRN:  [id number]  Arrival date & time: 12/02/19     Chief Complaint   Shortness of Breath   History of Present Illness   Patrick Ibarra is a 39 y.o. year-old male with a history of diabetes, CKD presenting to the ED with chief complaint of SOB.  Progressive shortness of breath over the past several days.  Has not had dialysis in weeks.  Reportedly hypoxic with EMS in the 70s.  Denies chest pain.  I was unable to obtain an accurate HPI, PMH, or ROS due to the patient's respiratory distress.  Level 5 caveat.  Review of Systems  Positive for respiratory distress.  Patient's Health History    Past Medical History:  Diagnosis Date  . Chronic kidney disease    dialysis M-W-F  . Diabetes mellitus without complication (HCC)    type 2, diet controlled  . Hypertension   . LV dysfunction   . Obesity     Past Surgical History:  Procedure Laterality Date  . AV FISTULA PLACEMENT Left 04/10/2017   Procedure: ARTERIOVENOUS (AV) FISTULA CREATION LEFT ARM;  Surgeon: Conrad Belfield, MD;  Location: [place];  Service: Vascular;  Laterality: Left;  . ESOPHAGOGASTRODUODENOSCOPY (EGD) WITH PROPOFOL N/A 04/04/2017   Procedure: ESOPHAGOGASTRODUODENOSCOPY (EGD) WITH PROPOFOL;  Surgeon: Carol Ada, MD;  Location: [place];  Service: Endoscopy;  Laterality: N/A;  . 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 INSERTION OF DIALYSIS CATHETER Right 04/10/2017   Procedure: INSERTION OF DIALYSIS CATHETER - RIGHT INTERNAL JUGULAR PLACEMENT;  Surgeon: Conrad Millstadt, MD;  Location: [place];  Service: Vascular;  Laterality: Right;  . 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 TONSILLECTOMY AND ADENOIDECTOMY      Family History  Problem Relation Age of Onset  . Hypertension Mother   . Diabetes Mother   . Hypertension Father     Social History   Socioeconomic History  . Marital status: Married    Spouse name: Not on file  . Number of children: Not on file  . Years of education: Not on file  . Highest education level: Not on file  Occupational History  . Not on file  Tobacco Use  . Smoking status: Never Smoker  . Smokeless tobacco: Never Used  Substance and Sexual Activity  . Alcohol use: Not Currently  . Drug use: No  . Sexual activity: Yes  Other Topics Concern  . Not on file  Social History Narrative   He is married with step kids. He works at [employer], [employer]/product.    Social Determinants of Health   Financial Resource Strain:   . Difficulty of Paying Living Expenses:   Food Insecurity:   . Worried About Charity fundraiser in the Last Year:   . Arboriculturist in the Last Year:   Transportation Needs:   . Film/video editor (Medical):   Marland Kitchen Lack of Transportation (Non-Medical):   Physical Activity:   . Days of Exercise per Week:   . Minutes of Exercise per Session:   Stress:   .  Feeling of Stress :   Social Connections:   . Frequency of Communication with Friends and Family:   . Frequency of Social Gatherings with Friends and Family:   . Attends Religious Services:   . Active Member of Clubs or Organizations:   . Attends Archivist Meetings:   Marland Kitchen Marital Status:   Intimate Partner Violence:   . Fear of Current or Ex-Partner:   . Emotionally Abused:   Marland Kitchen Physically Abused:   . Sexually Abused:      Physical Exam   Vitals:   12/02/19 0945 12/02/19 1000  BP: (!) 157/102 (!) 164/106  Pulse:    Resp: (!) 28 (!) 24  Temp:    SpO2:  95%    CONSTITUTIONAL: Well-appearing, NAD NEURO:   Alert and oriented x 3, no focal deficits EYES:  eyes equal and reactive ENT/NECK:  no LAD, no JVD CARDIO: Tachycardic rate, well-perfused, normal S1 and S2 PULM: Tachypneic, increased work of breathing, decreased breath sounds bilateral bases GI/GU:  normal bowel sounds, non-distended, non-tender MSK/SPINE:  No gross deformities, no edema SKIN:  no rash, atraumatic PSYCH:  Appropriate speech and behavior  *Additional and/or pertinent findings included in MDM below  Diagnostic and Interventional Summary    EKG Interpretation  Date/Time: 12/02/2019 at 08: 20: 27   Ventricular Rate:  112 PR Interval: 167   QRS Duration: 85 QT Interval:  384 QTC Calculation: 475 R Axis:     Text Interpretation: Sinus rhythm, no peaked T waves Confirmed by Dr. Gerlene Fee at 10:30 AM      Labs Reviewed  CBC - Abnormal; Notable for the following components:      Result Value   RBC 2.83 (*)    Hemoglobin 8.1 (*)    HCT 26.1 (*)    RDW 16.1 (*)    All other components within normal limits  COMPREHENSIVE METABOLIC PANEL - Abnormal; Notable for the following components:   Potassium 6.0 (*)    Chloride 97 (*)    CO2 16 (*)    BUN 127 (*)    Creatinine, Ser 29.15 (*)    Calcium 8.8 (*)    Albumin 3.0 (*)    Total Bilirubin 2.3 (*)    GFR calc non Af Amer 2 (*)    GFR calc Af Amer 2 (*)    Anion gap 26 (*)    All other components within normal limits  BRAIN NATRIURETIC PEPTIDE - Abnormal; Notable for the following components:   B Natriuretic Peptide >4,500.0 (*)    All other components within normal limits  POCT I-STAT EG7 - Abnormal; Notable for the following components:   pCO2, Ven 29.9 (*)    pO2, Ven 145.0 (*)    Bicarbonate 18.2 (*)    TCO2 19 (*)    Acid-base deficit 6.0 (*)    Potassium 5.7 (*)    Calcium, Ion 1.03 (*)    HCT 25.0 (*)    Hemoglobin 8.5 (*)    All other components within normal limits  TROPONIN I (HIGH SENSITIVITY) - Abnormal; Notable for the following  components:   Troponin I (High Sensitivity) 995 (*)    All other components within normal limits  LACTIC ACID, PLASMA  POC SARS CORONAVIRUS 2 AG -  ED  CBG MONITORING, ED  TROPONIN I (HIGH SENSITIVITY)    DG Chest Port 1 View  Final Result      Medications  Chlorhexidine Gluconate Cloth 2 % PADS 6 each (has  no administration in time range)  nitroGLYCERIN (NITROSTAT) SL tablet 0.4 mg (0.4 mg Sublingual Given 12/02/19 1013)  dexamethasone (DECADRON) injection 6 mg (6 mg Intravenous Given 12/02/19 1022)     Procedures  /  Critical Care .Critical Care Performed by: Maudie Flakes, MD Authorized by: Maudie Flakes, MD   Critical care provider statement:    Critical care time (minutes):  45   Critical care was necessary to treat or prevent imminent or life-threatening deterioration of the following conditions:  Respiratory failure and metabolic crisis   Critical care was time spent personally by me on the following activities:  Discussions with consultants, evaluation of patient's response to treatment, examination of patient, ordering and performing treatments and interventions, ordering and review of laboratory studies, ordering and review of radiographic studies, pulse oximetry, re-evaluation of patient's condition, obtaining history from patient or surrogate and review of old charts    ED Course and Medical Decision Making  I have reviewed the triage vital signs, the nursing notes, and pertinent available records from the EMR.  Pertinent labs & imaging results that were available during my care of the patient were reviewed by me and considered in my medical decision making (see below for details).     Multiple missed dialysis sessions, hypertensive, tachycardic, respiratory distress, highly suspicious for fluid overloaded state.  EKG is without evidence of significant hyperkalemia but will obtain an i-STAT potassium level to confirm.  Patient tested positive for coronavirus 2 weeks  ago, largely asymptomatic.  Will push for BiPAP, urgent dialysis.  Additional concerns the patient may not have HD access at this time.  Will consult nephrology for recommendations, may need to consult vascular surgery or IR for access needs.  Discussed case with Dr. Royce Macadamia of nephrology, and we are appreciative that she was able to speak with pulmonary/critical care for access.  Patient's laboratory assessment with x-rays more consistent with severe fluid overloaded state in the setting of missed dialysis rather than worsening COVID-19.  Admitted to ICU for further care.  Barth Kirks. Sedonia Small, [place] [email or web address]  Final Clinical Impressions(s) / ED Diagnoses     ICD-10-CM   1. Acute respiratory failure with hypoxia (HCC)  J96.01   2. Hypervolemia, unspecified hypervolemia type  E87.70     ED Discharge Orders    None       Discharge Instructions Discussed with and Provided to Patient:   Discharge Instructions   None       Maudie Flakes, MD 12/02/19 1031

## 2019-12-02 NOTE — Procedures (Signed)
Intubation Procedure Note CALE BETHARD [id number] 1981/08/14  Procedure: Intubation Indications: Respiratory insufficiency  Procedure Details Consent: Risks of procedure as well as the alternatives and risks of each were explained to the (patient/caregiver).  Consent for procedure obtained. Time Out: Verified patient identification, verified procedure, site/side was marked, verified correct patient position, special equipment/implants available, medications/allergies/relevent history reviewed, required imaging and test results available.  Performed  MAC and 4 Medications:  Fentanyl 100 mcg Etomidate 40 mg Versed 47m NMB rocuronium 100 mg      Evaluation Hemodynamic Status: BP stable throughout; O2 sats: stable throughout Patient's Current Condition: stable Complications: No apparent complications Patient did tolerate procedure well. Chest X-ray ordered to verify placement.  CXR: pending.   SRichardson LandryMinor ACNP LMaryanna ShapePCCM Pager [phone] 3 pm If no answer page [phone]/09/2019, 12:18 PM

## 2019-12-02 NOTE — Patient Outreach (Signed)
Pt is hospitalized, in ICU, for respiratory failure, ESRD.  Patrick Ibarra. Patrick Neither, MSN, [facility] Gerontological Nurse Practitioner [facility] Care Management [phone]

## 2019-12-02 NOTE — ED Triage Notes (Signed)
PT presents from home by [facility] on NRB Patient has not had dialysis for more than 1 month 72% per fire department  On arrival PT is alert, oriented, self historian Placed on 5 lit [place] patient has labored breathing RT at bedside EDP at bedside

## 2019-12-02 NOTE — Telephone Encounter (Signed)
Will route to pcp 

## 2019-12-03 ENCOUNTER — Inpatient Hospital Stay (HOSPITAL_COMMUNITY): Payer: Medicare Other

## 2019-12-03 LAB — POCT I-STAT 7, (LYTES, BLD GAS, ICA,H+H)
Bicarbonate: 23.4 mmol/L (ref 20.0–28.0)
Calcium, Ion: 1.1 mmol/L — ABNORMAL LOW (ref 1.15–1.40)
HCT: 20 % — ABNORMAL LOW (ref 39.0–52.0)
Hemoglobin: 6.8 g/dL — CL (ref 13.0–17.0)
O2 Saturation: 97 %
Potassium: 5.7 mmol/L — ABNORMAL HIGH (ref 3.5–5.1)
Sodium: 134 mmol/L — ABNORMAL LOW (ref 135–145)
TCO2: 24 mmol/L (ref 22–32)
pCO2 arterial: 33.1 mmHg (ref 32.0–48.0)
pH, Arterial: 7.458 — ABNORMAL HIGH (ref 7.350–7.450)
pO2, Arterial: 80 mmHg — ABNORMAL LOW (ref 83.0–108.0)

## 2019-12-03 LAB — CBC
HCT: 23.9 % — ABNORMAL LOW (ref 39.0–52.0)
Hemoglobin: 7.5 g/dL — ABNORMAL LOW (ref 13.0–17.0)
MCH: 28.5 pg (ref 26.0–34.0)
MCHC: 31.4 g/dL (ref 30.0–36.0)
MCV: 90.9 fL (ref 80.0–100.0)
Platelets: 188 10*3/uL (ref 150–400)
RBC: 2.63 MIL/uL — ABNORMAL LOW (ref 4.22–5.81)
RDW: 15.8 % — ABNORMAL HIGH (ref 11.5–15.5)
WBC: 4.6 10*3/uL (ref 4.0–10.5)
nRBC: 0 % (ref 0.0–0.2)

## 2019-12-03 LAB — BASIC METABOLIC PANEL
Anion gap: 21 — ABNORMAL HIGH (ref 5–15)
BUN: 78 mg/dL — ABNORMAL HIGH (ref 6–20)
CO2: 21 mmol/L — ABNORMAL LOW (ref 22–32)
Calcium: 8.9 mg/dL (ref 8.9–10.3)
Chloride: 95 mmol/L — ABNORMAL LOW (ref 98–111)
Creatinine, Ser: 19.86 mg/dL — ABNORMAL HIGH (ref 0.61–1.24)
GFR calc Af Amer: 3 mL/min — ABNORMAL LOW (ref >60)
GFR calc non Af Amer: 3 mL/min — ABNORMAL LOW (ref >60)
Glucose, Bld: 99 mg/dL (ref 70–99)
Potassium: 5.8 mmol/L — ABNORMAL HIGH (ref 3.5–5.1)
Sodium: 137 mmol/L (ref 135–145)

## 2019-12-03 LAB — HEMOGLOBIN A1C
Hgb A1c MFr Bld: 5.4 % (ref 4.8–5.6)
Mean Plasma Glucose: 108.28 mg/dL

## 2019-12-03 LAB — GLUCOSE, CAPILLARY
Glucose-Capillary: 80 mg/dL (ref 70–99)
Glucose-Capillary: 94 mg/dL (ref 70–99)
Glucose-Capillary: 84 mg/dL (ref 70–99)
Glucose-Capillary: 57 mg/dL — ABNORMAL LOW (ref 70–99)
Glucose-Capillary: 79 mg/dL (ref 70–99)
Glucose-Capillary: 86 mg/dL (ref 70–99)

## 2019-12-03 LAB — PHOSPHORUS: Phosphorus: 9.5 mg/dL — ABNORMAL HIGH (ref 2.5–4.6)

## 2019-12-03 LAB — MAGNESIUM: Magnesium: 2.4 mg/dL (ref 1.7–2.4)

## 2019-12-03 MED ORDER — LIDOCAINE HCL 1 % IJ SOLN
INTRAMUSCULAR | Status: AC
  Filled 2019-12-03: qty 20

## 2019-12-03 MED ORDER — LABETALOL HCL 5 MG/ML IV SOLN
10.0000 mg | INTRAVENOUS | Status: DC | PRN
  Administered 2019-12-05 – 2019-12-08 (×2): 10 mg via INTRAVENOUS
  Filled 2019-12-03 (×3): qty 4

## 2019-12-03 MED ORDER — DEXTROSE 50 % IV SOLN
INTRAVENOUS | Status: AC
  Administered 2019-12-03: 50 mL
  Filled 2019-12-03: qty 50

## 2019-12-03 MED ORDER — HEPARIN SODIUM (PORCINE) 5000 UNIT/ML IJ SOLN
5000.0000 [IU] | Freq: Three times a day (TID) | INTRAMUSCULAR | Status: DC
  Administered 2019-12-03 – 2019-12-06 (×9): 5000 [IU] via SUBCUTANEOUS
  Filled 2019-12-03 (×8): qty 1

## 2019-12-03 MED ORDER — PRO-STAT SUGAR FREE PO LIQD: 60.0000 mL | Freq: Three times a day (TID) | ORAL | Status: DC

## 2019-12-03 MED ORDER — DARBEPOETIN ALFA 60 MCG/0.3ML IJ SOSY
60.0000 ug | PREFILLED_SYRINGE | INTRAMUSCULAR | Status: DC
  Filled 2019-12-03: qty 0.3

## 2019-12-03 MED ORDER — DARBEPOETIN ALFA 60 MCG/0.3ML IJ SOSY
60.0000 ug | PREFILLED_SYRINGE | INTRAMUSCULAR | Status: DC
  Administered 2019-12-03: 60 ug via INTRAVENOUS
  Filled 2019-12-03 (×3): qty 0.3

## 2019-12-03 MED ORDER — GELATIN ABSORBABLE 12-7 MM EX MISC
CUTANEOUS | Status: AC
  Filled 2019-12-03: qty 1

## 2019-12-03 MED ORDER — ONDANSETRON HCL 4 MG/2ML IJ SOLN
4.0000 mg | Freq: Once | INTRAMUSCULAR | Status: AC
  Administered 2019-12-04: 4 mg via INTRAVENOUS

## 2019-12-03 MED ORDER — HEPARIN SODIUM (PORCINE) 1000 UNIT/ML IJ SOLN
INTRAMUSCULAR | Status: AC
  Filled 2019-12-03: qty 1

## 2019-12-03 MED ORDER — CHLORHEXIDINE GLUCONATE CLOTH 2 % EX PADS
6.0000 | MEDICATED_PAD | Freq: Every day | CUTANEOUS | Status: DC
  Administered 2019-12-03: 6 via TOPICAL

## 2019-12-03 MED ORDER — RENA-VITE PO TABS
1.0000 | ORAL_TABLET | Freq: Every day | ORAL | Status: DC
  Administered 2019-12-04 – 2019-12-10 (×7): 1
  Filled 2019-12-03 (×7): qty 1

## 2019-12-03 MED ORDER — INSULIN ASPART 100 UNIT/ML ~~LOC~~ SOLN
0.0000 [IU] | SUBCUTANEOUS | Status: DC
  Administered 2019-12-08: 20:00:00 2 [IU] via SUBCUTANEOUS

## 2019-12-03 MED ORDER — CHLORHEXIDINE GLUCONATE CLOTH 2 % EX PADS
6.0000 | MEDICATED_PAD | Freq: Every day | CUTANEOUS | Status: DC
  Administered 2019-12-05: 6 via TOPICAL

## 2019-12-03 MED ORDER — VITAL HIGH PROTEIN PO LIQD
1000.0000 mL | ORAL | Status: DC
  Administered 2019-12-03: 1000 mL

## 2019-12-03 MED ORDER — HEPARIN SODIUM (PORCINE) 1000 UNIT/ML IJ SOLN
INTRAMUSCULAR | Status: AC
  Filled 2019-12-03: qty 4

## 2019-12-03 MED ORDER — ONDANSETRON HCL 4 MG PO TABS
4.0000 mg | ORAL_TABLET | Freq: Once | ORAL | Status: AC
  Administered 2019-12-03: 4 mg via ORAL
  Filled 2019-12-03: qty 1

## 2019-12-03 MED ORDER — AMLODIPINE BESYLATE 10 MG PO TABS
10.0000 mg | ORAL_TABLET | Freq: Every day | ORAL | Status: DC
  Administered 2019-12-03 – 2019-12-10 (×7): 10 mg via ORAL
  Filled 2019-12-03 (×5): qty 1
  Filled 2019-12-03: qty 2
  Filled 2019-12-03: qty 1

## 2019-12-03 MED ORDER — PRO-STAT SUGAR FREE PO LIQD
30.0000 mL | Freq: Two times a day (BID) | ORAL | Status: DC
  Administered 2019-12-03: 10:00:00 30 mL
  Filled 2019-12-03: qty 30

## 2019-12-03 MED ORDER — CARVEDILOL 12.5 MG PO TABS
12.5000 mg | ORAL_TABLET | Freq: Two times a day (BID) | ORAL | Status: DC
  Administered 2019-12-04 – 2019-12-11 (×14): 12.5 mg via ORAL
  Filled 2019-12-03 (×8): qty 1
  Filled 2019-12-03: qty 2
  Filled 2019-12-03: qty 1
  Filled 2019-12-03: qty 2
  Filled 2019-12-03 (×2): qty 1

## 2019-12-03 NOTE — Progress Notes (Signed)
Assisted tele visit to patient with wife.  Adam Phenix, RN

## 2019-12-03 NOTE — Progress Notes (Signed)
Phone call to floor to speak with RN about bringing this pt down for procedure. RN on floor states that this pt primary RN is off the floor at this time and they are at limited staffing and unable to travel down with pt. Pt has drips that are being titrated at this time.  Spoke with Dr Annamaria Boots regarding this. Informed that pt was able to be dialyzed today. He states that pt has access for HD and we will reevaluate on Monday. PA aware, RN on floor aware, IR aware.

## 2019-12-03 NOTE — Progress Notes (Signed)
[facility] Progress Note  Name: Patrick Ibarra MRN: [id number] DOB: [birth year]  Chief Complaint:  Shortness of breath    Subjective:  Got HD yesterday after nontunneled catheter with critical care.  He was intubated  Review of systems:  Unable to obtain secondary to intubated  ------------------- Patrick Ibarra is a 39 y.o. male with a history of end-stage renal disease recently grossly noncompliant with dialysis, hypertension, and diabetes mellitus and recent hospital admission with covid who presented to the hospital with shortness of breath and swelling.  Came in on NRB with sat 72% per fire department.  He states that this has been progressive over several days.  When he was here last admission he tested positive for Covid on 3/16.  He was discharged on 3/20.  During that admission he received dialysis via a nontunneled dialysis catheter placed by critical care which was removed at discharge.  He had previously hoped for a holistic approach which we discussed would not be compatible with life.  In the past he has refused permanent access and has a prior nonfunctional upper left arm access which he states has not been used for months.  He does not have a current outpatient dialysis unit.  He states he previously went to [facility].  Last HD was here on 3/20.  The patient and I had a frank discussion that emergent dialysis on recurrent occasions is not appropriate and that if he did want to continue with dialysis he would need to have a permanent access placed (a tunneled catheter when more stable) and would need to agree to do dialysis 3 times a week.  We discussed that if he was not willing to commit to routine dialysis that it would be appropriate to consult palliative care.  He does wish to live and wants to continue with dialysis.  He said a tunneled catheter was recommended and he does agree to do this.      Intake/Output Summary (Last 24 hours) at 12/03/2019 0649 Last data  filed at 12/03/2019 0458 Gross per 24 hour  Intake 556.45 ml  Output 3000 ml  Net -2443.55 ml    Vitals:  Vitals:   12/03/19 0400 12/03/19 0430 12/03/19 0453 12/03/19 0459  BP: (!) 173/115 (!) 158/105    Pulse:   69   Resp:   20   Temp: 97.6 F (36.4 C)     TempSrc: Axillary     SpO2:   100%   Weight:    106.8 kg     Physical Exam:  General adult male in bed intubated HEENT normocephalic atraumatic Neck trachea midline, JVD  Lungs coarse mechanical breath sounds Heart S1S2 no rub Abdomen soft nondistended Extremities 3+ edema  Neuro sedation running  Access: right fem nontunneled catheter in place; left arm old AVF no bruit or thrill with scab overlying    Medications reviewed    Labs:  BMP Latest Ref Rng & Units 12/03/2019 12/03/2019 12/02/2019  Glucose 70 - 99 mg/dL 99 - 117(H)  BUN 6 - 20 mg/dL 78(H) - 127(H)  Creatinine 0.61 - 1.24 mg/dL 19.86(H) - 29.42(H)  BUN/Creat Ratio 9 - 20 - - -  Sodium 135 - 145 mmol/L 137 134(L) 139  Potassium 3.5 - 5.1 mmol/L 5.8(H) 5.7(H) 5.7(H)  Chloride 98 - 111 mmol/L 95(L) - 96(L)  CO2 22 - 32 mmol/L 21(L) - 20(L)  Calcium 8.9 - 10.3 mg/dL 8.9 - 9.1     Assessment/Plan:   # Acute  hypoxic respiratory failure - Secondary to fluid overload as well as Covid pneumonia - Optimize volume with HD and therapies per primary team - on mechanical ventilation per pulmonology   # End-stage renal disease - no outpatient schedule given gross noncompliance and no current clinic  - HD today, 4/2.  Assess dialysis needs daily  - Consulted IR for tunneled dialysis catheter.  Prior to intubation, he and I have discussed that committing to continuing dialysis is necessary.  If he is not willing to continue with hemodialysis it would be appropriate to consult palliative care.  He did agree to the tunneled catheter during our conversation in the ER  # COVID-19 pneumonia - Therapies per primary team  # Hypertension - Optimize volume status with  dialysis  # Anemia of CKD - PRBC's per primary team   - Start aranesp 60 mcg weekly   # Noncompliance - Currently critically ill.  Goals of care discussions critical.  If he is ultimately not willing to continue with routine three times a week hemodialysis it would be appropriate to consult palliative care.  A palliative care consult is likely appropriate regardless given gross noncompliance and poor insight  # Secondary hyperparathyroidism and metabolic bone disease - hyperphos - getting HD and binders once taking PO   - intact PTH pending  He is critically ill and I worry about high risk of readmission and/or mortality given his past noncompliance  Claudia Desanctis, MD 12/03/2019 7:41 AM

## 2019-12-03 NOTE — Progress Notes (Addendum)
Assisted tele visit to patient with wife, Darcus Pester.  Maryelizabeth Rowan, RN

## 2019-12-03 NOTE — Procedures (Signed)
Extubation Procedure Note  Patient Details:   Name: Patrick Ibarra DOB: 1981-09-02 MRN: [id number]   Airway Documentation:    Vent end date: 12/03/19 Vent end time: 1638   Evaluation  O2 sats: stable throughout Complications: No apparent complications Patient did tolerate procedure well. Bilateral Breath Sounds: Diminished   Yes   Pt extubated to 3L HFNC per MD order. Pt stable throughout with no complications. Pt able to speak and has a strong non productive cough post extubation. Pt encouraged to use yankauer to clear secretions. RT will continue to monitor   Jesse Sans 12/03/2019, 4:39 PM

## 2019-12-03 NOTE — Progress Notes (Signed)
Initial Nutrition Assessment  DOCUMENTATION CODES:   Not applicable  INTERVENTION:   Tube Feeding: Vital High Protein at 40 ml/hr Pro-Stat 60 mL TID Provides 175 g of protein, 1560 kcals and 806 mL of free water Monitor electrolytes and adjust as needed.   TF regimen and propofol at current rate providing 1903 total kcal/day  Add Rena-Vit (Renal MVI) daily   NUTRITION DIAGNOSIS:   Inadequate oral intake related to acute illness as evidenced by NPO status.  GOAL:   Provide needs based on ASPEN/SCCM guidelines  MONITOR:   Vent status, TF tolerance, Labs, Weight trends  REASON FOR ASSESSMENT:   Ventilator, Consult Enteral/tube feeding initiation and management  ASSESSMENT:   39 yo male admitted with acute respiratory failure in setting of volume overload from non-compliance with HD; pt also COVID-19 + diagnosed on 11/16/19. PMH includes DM, obesity, HTN, ESRD  RD working remotely.  4/01 Admitted, Intubated, emergent HD post non-tunneled HD cath placement  HD again today, IR consulted for tunneled HD cath  Patient is currently intubated on ventilator support MV:12.5 L/min Temp (24hrs), Avg:97.6 F (36.4 C), Min:97.1 F (36.2 C), Max:97.8 F (36.6 C)  Propofol: 13 ml/hr  OG tube with tip in stomach per abdominal xray  Admit weight on 4/01 109.2 kg; post HD weight on 4/01 106.7 kg. Current weight 106.8 kg. Unsure of actual dry weight. Plan to utilize 106 kg as EDW for now  Hyperkalemia and hyperphosphatemia likely related to non-adherence to HD; improving with HD. Noted iPTH pending  Labs: potassium 5.8 (H), sodium wdl, phosphorus 9.5 ((H), Creatinine 19.86, BUN 78 Meds: aranesp, ss novolog  Diet Order:   Diet Order            Diet NPO time specified  Diet effective now              EDUCATION NEEDS:   Not appropriate for education at this time  Skin:  Skin Assessment: Reviewed RN Assessment  Last BM:  no documented BM  Height:   Ht Readings  from Last 1 Encounters:  11/10/19 6\' 1"  (1.854 m)    Weight:   Wt Readings from Last 1 Encounters:  12/03/19 106.8 kg    BMI:  Body mass index is 31.06 kg/m.  Estimated Nutritional Needs:   Kcal:  8315-1761 kcals  Protein:  167-209 g  Fluid:  1000 mL plus UOP  Kerman Passey MS, RDN, LDN, CNSC RD Pager Number and Weekend/On-Call After Hours Pager Located in [place]

## 2019-12-03 NOTE — Progress Notes (Signed)
PULMONARY / CRITICAL CARE MEDICINE   NAME:  Patrick Ibarra, MRN:  [id number], DOB:  October 04, 1980, LOS: 1 ADMISSION DATE:  12/02/2019, CONSULTATION DATE: 12/02/2019 REFERRING MD: Emergency department physician, CHIEF COMPLAINT: Acute respiratory failure in the setting of volume overload from noncompliance with end-stage renal disease and COVID-19  BRIEF HISTORY:    39 year old with end-stage renal disease and noncompliance with dialysis COVID-19 positive presents with acute respiratory distress and unable to lay flat. HISTORY OF PRESENT ILLNESS   39 year old male with a history of end-stage renal disease has been noncompliant in the past despite having hemodialysis catheter placed temporarily subsequently has been removed and signed out.  Last time was 11/16/2019 at which time he was also Covid positive. 12/02/2018 when he represents emergency department increasing shortness of breath with volume overload still in a Covid positive window with difficulty breathing.  Pulmonary critical care was asked to evaluate for dialysis catheter placement but he is unable to lay flat he is tachypneic at rest and with using abdominal  paradoxical chest wall movement. He will be admitted to the intensive care unit to the Covid unit at which time we will be evaluated for intubation and placement of a central line hemodialysis catheter. There is been multiple discussions concerning palliation although he is only 39 years old and has been grossly noncompliant with dialysis. Denies substance abuse and smoking.  He is in obvious respiratory distress at the time of this examination. SIGNIFICANT PAST MEDICAL HISTORY   End-stage renal disease with noncompliance Hypertension COVID-19 diagnosed 11/16/2019 Obesity Diabetes mellitus Noncompliance  SIGNIFICANT EVENTS:  3/16 Intubated, placed on emergent dialysis  STUDIES:    CULTURES:  11/16/2019 Covid positive   CONSULTANTS:  12/02/2019 renal SUBJECTIVE:  Failed SBT this  am.  CONSTITUTIONAL: BP (!) 145/97   Pulse 67   Temp 97.6 F (36.4 C) (Axillary)   Resp 20   Wt 106.8 kg   SpO2 100%   BMI 31.06 kg/m   I/O last 3 completed shifts: In: 603 [I.V.:603] Out: 3000 [Other:3000]     Vent Mode: PRVC FiO2 (%):  [40 %-100 %] 40 % Set Rate:  [20 bmp] 20 bmp Vt Set:  [640 mL] 640 mL PEEP:  [5 cmH20-8 cmH20] 5 cmH20 Plateau Pressure:  [22 cmH20-31 cmH20] 22 cmH20  PHYSICAL EXAM: GEN: young man on vent HEENT: +JVD, ett in place with minimal secretions CV: RRR. Ext warm PULM: suprisingly clear, no accessory muscle use GI: soft, hypoactive BS EXT: 3+ pitting edema to thighs NEURO: awakens to voice and moves all 4 ext PSYCH: RASS -1 SKIN: LUE failed fistula noted, multiple scars and tatoos, no rashes                                                                                   RESOLVED PROBLEM LIST   ASSESSMENT AND PLAN   Acute hypoxemic respiratory failure due to fluid overload from HD noncompliance - continue aggressive volume removal, appreciate Dr. Luis Abed help - daily SAT/SBT - VAP prevention bundle  ESRD- terrible insight into condition, will die if he keeps up current cycle of salvage HD - Tunneled line once extubated and more stable  COVID+- not  contributing to current presentation - airborne isolation in place  Best practice:  Diet: start TF Pain/Anxiety/Delirium protocol (if indicated): propofol/fentanyl VAP protocol (if indicated): in place DVT prophylaxis: heparin GI prophylaxis: PPI Glucose control: SSI Mobility: BR Code Status: full Family Communication: updated patient Disposition:  ICU pending vent liberation    The patient is critically ill with multiple organ systems failure and requires high complexity decision making for assessment and support, frequent evaluation and titration of therapies, application of advanced monitoring technologies and extensive interpretation of multiple databases. Critical Care Time  devoted to patient care services described in this note independent of APP/resident time (if applicable)  is 32 minutes.   Erskine Emery MD [place] [facility] 12/03/2019 8:46 AM Personal pager: [phone] If unanswered, please page CCM On-call: [phone]

## 2019-12-03 NOTE — Progress Notes (Signed)
eLink Physician-Brief Progress Note Patient Name: Patrick Ibarra DOB: July 01, 1981 MRN: [id number]   Date of Service  12/03/2019  HPI/Events of Note  Notified that patient is nauseous. Patient seen comfortable, tolerating crackers as this time.  eICU Interventions  Zofran 4 mg tablet ordered     Intervention Category Minor Interventions: Other:  Judd Lien 12/03/2019, 9:38 PM

## 2019-12-04 LAB — GLUCOSE, CAPILLARY
Glucose-Capillary: 121 mg/dL — ABNORMAL HIGH (ref 70–99)
Glucose-Capillary: 59 mg/dL — ABNORMAL LOW (ref 70–99)
Glucose-Capillary: 67 mg/dL — ABNORMAL LOW (ref 70–99)
Glucose-Capillary: 69 mg/dL — ABNORMAL LOW (ref 70–99)
Glucose-Capillary: 72 mg/dL (ref 70–99)
Glucose-Capillary: 81 mg/dL (ref 70–99)
Glucose-Capillary: 93 mg/dL (ref 70–99)
Glucose-Capillary: 95 mg/dL (ref 70–99)

## 2019-12-04 LAB — RENAL FUNCTION PANEL
Albumin: 2.4 g/dL — ABNORMAL LOW (ref 3.5–5.0)
Anion gap: 16 — ABNORMAL HIGH (ref 5–15)
BUN: 50 mg/dL — ABNORMAL HIGH (ref 6–20)
CO2: 27 mmol/L (ref 22–32)
Calcium: 8.4 mg/dL — ABNORMAL LOW (ref 8.9–10.3)
Chloride: 96 mmol/L — ABNORMAL LOW (ref 98–111)
Creatinine, Ser: 14.22 mg/dL — ABNORMAL HIGH (ref 0.61–1.24)
GFR calc Af Amer: 5 mL/min — ABNORMAL LOW (ref >60)
GFR calc non Af Amer: 4 mL/min — ABNORMAL LOW (ref >60)
Glucose, Bld: 92 mg/dL (ref 70–99)
Phosphorus: 7.9 mg/dL — ABNORMAL HIGH (ref 2.5–4.6)
Potassium: 4.5 mmol/L (ref 3.5–5.1)
Sodium: 139 mmol/L (ref 135–145)

## 2019-12-04 LAB — CBC WITH DIFFERENTIAL/PLATELET
Abs Immature Granulocytes: 0.02 10*3/uL (ref 0.00–0.07)
Basophils Absolute: 0 10*3/uL (ref 0.0–0.1)
Basophils Relative: 0 %
Eosinophils Absolute: 0.1 10*3/uL (ref 0.0–0.5)
Eosinophils Relative: 1 %
HCT: 24.6 % — ABNORMAL LOW (ref 39.0–52.0)
Hemoglobin: 7.5 g/dL — ABNORMAL LOW (ref 13.0–17.0)
Immature Granulocytes: 0 %
Lymphocytes Relative: 26 %
Lymphs Abs: 1.9 10*3/uL (ref 0.7–4.0)
MCH: 28.2 pg (ref 26.0–34.0)
MCHC: 30.5 g/dL (ref 30.0–36.0)
MCV: 92.5 fL (ref 80.0–100.0)
Monocytes Absolute: 0.5 10*3/uL (ref 0.1–1.0)
Monocytes Relative: 6 %
Neutro Abs: 4.8 10*3/uL (ref 1.7–7.7)
Neutrophils Relative %: 67 %
Platelets: 241 10*3/uL (ref 150–400)
RBC: 2.66 MIL/uL — ABNORMAL LOW (ref 4.22–5.81)
RDW: 15.8 % — ABNORMAL HIGH (ref 11.5–15.5)
WBC: 7.3 10*3/uL (ref 4.0–10.5)
nRBC: 0 % (ref 0.0–0.2)

## 2019-12-04 MED ORDER — HEPARIN SODIUM (PORCINE) 1000 UNIT/ML IJ SOLN
INTRAMUSCULAR | Status: AC
  Administered 2019-12-04: 11:00:00 3200 [IU] via INTRAVENOUS_CENTRAL
  Filled 2019-12-04: qty 6

## 2019-12-04 MED ORDER — PANTOPRAZOLE SODIUM 40 MG PO TBEC
40.0000 mg | DELAYED_RELEASE_TABLET | Freq: Every day | ORAL | Status: DC
  Administered 2019-12-04 – 2019-12-10 (×7): 40 mg via ORAL
  Filled 2019-12-04 (×7): qty 1

## 2019-12-04 NOTE — Progress Notes (Signed)
[facility] Progress Note  Subjective: seen in ICU, has orders to go to the floor  Vitals:   12/04/19 1037 12/04/19 1046 12/04/19 1100 12/04/19 1200  BP: (!) 133/97 134/90 (!) 126/93 (!) 144/91  Pulse: 80  75 67  Resp: 13  12 11  Temp: (!) 95.9 F (35.5 C)     TempSrc: Axillary     SpO2: 100%  100% 100%  Weight:  100.9 kg      Exam:  alert, no distress   Mild jvd   Chest cta bilat   Cor reg no mrg   Abd soft ntnd obese   Ext trace LE edema bilat   Neuro - alert and Ox 3      I/O - net 1.3L in and 10.5 L out, net negative 9.1 L   Wt;s 109 kg admit >> 101kg today   BUN 127 >> 50,  Creat 29 >> 14 today    Assessment/ Plan: #Acute hypoxic respiratory failure -Secondary to fluid overload as well as Covid pneumonia - extubated on 4/2 - stable from resp standpoint  #End-stage renal disease - no outpatient schedule given gross noncompliance and no current clinic  - HD again today, #3 HD this admit, max UF as tol -Consulted IR for tunneled dialysis catheter. Prior to intubation we have discussed that committing to continuing dialysis is necessary. If he is not willing to continue with hemodialysis it would be appropriate to consult palliative care.  He did agree to the tunneled catheter during our conversation in the ER. Have consulted IR for this, has temp cath only at this time.   #COVID-19 pneumonia -Therapies per primary team  #Hypertension -Optimize volume status with dialysis - down 8-9kg from admit , sp HD x 3  #Anemia of CKD - PRBC's per primary team   - Start aranesp 60 mcg weekly   #Noncompliance - Currently critically ill.  Goals of care discussions critical.  If he is ultimately not willing to continue with routine three times a week hemodialysis it would be appropriate to refer to palliative care.  A palliative care consult is appropriate regardless given gross noncompliance and poor insight, will order.   # Secondary  hyperparathyroidism and metabolic bone disease - hyperphos - getting HD and binders once taking PO   - intact PTH pending  He is critically ill and I worry about high risk of readmission and/or mortality given his past noncompliance   Rob Schertz 12/04/2019, 2:57 PM   Recent Labs  Lab 12/02/19 1315 12/03/19 0428 12/03/19 0934 12/04/19 0801  K  --  5.8*  --  4.5  BUN  --  78*  --  50*  CREATININE  --  19.86*  --  14.22*  CALCIUM  --  8.9  --  8.4*  PHOS   < >  --  9.5* 7.9*  HGB  --  7.5*  --  7.5*   < > = values in this interval not displayed.   Inpatient medications: . amLODipine  10 mg Oral Daily  . carvedilol  12.5 mg Oral BID WC  . chlorhexidine gluconate (MEDLINE KIT)  15 mL Mouth Rinse BID  . Chlorhexidine Gluconate Cloth  6 each Topical Q0600  . Chlorhexidine Gluconate Cloth  6 each Topical Q0600  . Chlorhexidine Gluconate Cloth  6 each Topical Q0600  . darbepoetin (ARANESP) injection - DIALYSIS  60 mcg Intravenous Q Fri-HD  . heparin injection (subcutaneous)  5,000 Units Subcutaneous Q8H  .   insulin aspart  0-9 Units Subcutaneous Q4H  . multivitamin  1 tablet Per Tube QHS  . pantoprazole  40 mg Oral QHS   . sodium chloride    . sodium chloride    . sodium chloride Stopped (12/03/19 0430)   sodium chloride, sodium chloride, alteplase, heparin, labetalol, lidocaine (PF), lidocaine-prilocaine, midazolam, pentafluoroprop-tetrafluoroeth

## 2019-12-04 NOTE — Progress Notes (Signed)
PULMONARY / CRITICAL CARE MEDICINE   NAME:  Patrick Ibarra, MRN:  [id number], DOB:  1980/10/10, LOS: 2 ADMISSION DATE:  12/02/2019, CONSULTATION DATE: 12/02/2019 REFERRING MD: Emergency department physician, CHIEF COMPLAINT: Acute respiratory failure in the setting of volume overload from noncompliance with end-stage renal disease and COVID-19  BRIEF HISTORY:    39 year old with end-stage renal disease and noncompliance with dialysis COVID-19 positive presents with acute respiratory distress and unable to lay flat. HISTORY OF PRESENT ILLNESS   38 year old male with a history of end-stage renal disease has been noncompliant in the past despite having hemodialysis catheter placed temporarily subsequently has been removed and signed out.  Last time was 11/16/2019 at which time he was also Covid positive. 12/02/2018 when he represents emergency department increasing shortness of breath with volume overload still in a Covid positive window with difficulty breathing.  Pulmonary critical care was asked to evaluate for dialysis catheter placement but he is unable to lay flat he is tachypneic at rest and with using abdominal  paradoxical chest wall movement. He will be admitted to the intensive care unit to the Covid unit at which time we will be evaluated for intubation and placement of a central line hemodialysis catheter. There is been multiple discussions concerning palliation although he is only 39 years old and has been grossly noncompliant with dialysis. Denies substance abuse and smoking.  He is in obvious respiratory distress at the time of this examination. SIGNIFICANT PAST MEDICAL HISTORY   End-stage renal disease with noncompliance Hypertension COVID-19 diagnosed 11/16/2019 Obesity Diabetes mellitus Noncompliance  SIGNIFICANT EVENTS:  3/16 Intubated, placed on emergent dialysis  STUDIES:    CULTURES:  11/16/2019 Covid positive   CONSULTANTS:  12/02/2019 renal SUBJECTIVE:  Extubated after HD  yesterday. Some sore throat otherwise doing well.  CONSTITUTIONAL: BP (!) (P) 140/111 (BP Location: Right Arm)   Pulse 76   Temp 98.3 F (36.8 C) (Oral)   Resp 14   Wt 107.8 kg   SpO2 100%   BMI 31.35 kg/m   I/O last 3 completed shifts: In: 926.7 [I.V.:686.7; NG/GT:240] Out: 3500 [Other:3500]  PHYSICAL EXAM: GEN: young man in NAD HEENT: trachea midline, neck veins less prominent CV: RRR. Ext warm PULM: suprisingly clear, no accessory muscle use GI: soft, hypoactive BS EXT: 1+ pitting edema to thighs NEURO: moves all 4 ext to command PSYCH: RASS 0 SKIN: LUE failed fistula noted, multiple scars and tatoos, no rashes                                                                                   RESOLVED PROBLEM LIST   ASSESSMENT AND PLAN   Acute hypoxemic respiratory failure due to fluid overload from HD noncompliance - improving - HD today then can go to floor  ESRD- terrible insight into condition, will die if he keeps up current cycle of salvage HD - HD today - Tunneled line likely Monday  COVID+- not contributing to current presentation - airborne isolation in place  HTN- home meds re-ordered, titrate PRN  Erskine Emery MD [facility] 12/04/2019 7:29 AM Personal pager: [phone] If unanswered, please page CCM On-call: [phone]

## 2019-12-05 ENCOUNTER — Other Ambulatory Visit: Payer: Self-pay

## 2019-12-05 ENCOUNTER — Encounter (HOSPITAL_COMMUNITY): Payer: Self-pay | Admitting: Pulmonary Disease

## 2019-12-05 DIAGNOSIS — I10 Essential (primary) hypertension: Secondary | ICD-10-CM

## 2019-12-05 DIAGNOSIS — Z9119 Patient's noncompliance with other medical treatment and regimen: Secondary | ICD-10-CM

## 2019-12-05 DIAGNOSIS — N186 End stage renal disease: Secondary | ICD-10-CM

## 2019-12-05 LAB — RENAL FUNCTION PANEL
Albumin: 2.2 g/dL — ABNORMAL LOW (ref 3.5–5.0)
Anion gap: 14 (ref 5–15)
BUN: 36 mg/dL — ABNORMAL HIGH (ref 6–20)
CO2: 27 mmol/L (ref 22–32)
Calcium: 7.9 mg/dL — ABNORMAL LOW (ref 8.9–10.3)
Chloride: 98 mmol/L (ref 98–111)
Creatinine, Ser: 10.81 mg/dL — ABNORMAL HIGH (ref 0.61–1.24)
GFR calc Af Amer: 6 mL/min — ABNORMAL LOW (ref >60)
GFR calc non Af Amer: 5 mL/min — ABNORMAL LOW (ref >60)
Glucose, Bld: 90 mg/dL (ref 70–99)
Phosphorus: 5.3 mg/dL — ABNORMAL HIGH (ref 2.5–4.6)
Potassium: 4.1 mmol/L (ref 3.5–5.1)
Sodium: 139 mmol/L (ref 135–145)

## 2019-12-05 LAB — CBC
HCT: 22.9 % — ABNORMAL LOW (ref 39.0–52.0)
Hemoglobin: 7.2 g/dL — ABNORMAL LOW (ref 13.0–17.0)
MCH: 28.8 pg (ref 26.0–34.0)
MCHC: 31.4 g/dL (ref 30.0–36.0)
MCV: 91.6 fL (ref 80.0–100.0)
Platelets: 237 10*3/uL (ref 150–400)
RBC: 2.5 MIL/uL — ABNORMAL LOW (ref 4.22–5.81)
RDW: 15.5 % (ref 11.5–15.5)
WBC: 6.5 10*3/uL (ref 4.0–10.5)
nRBC: 0 % (ref 0.0–0.2)

## 2019-12-05 LAB — GLUCOSE, CAPILLARY
Glucose-Capillary: 103 mg/dL — ABNORMAL HIGH (ref 70–99)
Glucose-Capillary: 92 mg/dL (ref 70–99)
Glucose-Capillary: 88 mg/dL (ref 70–99)
Glucose-Capillary: 76 mg/dL (ref 70–99)
Glucose-Capillary: 113 mg/dL — ABNORMAL HIGH (ref 70–99)

## 2019-12-05 MED ORDER — CHLORHEXIDINE GLUCONATE 0.12 % MT SOLN
OROMUCOSAL | Status: AC
  Administered 2019-12-05: 10:00:00 15 mL via OROMUCOSAL
  Filled 2019-12-05: qty 15

## 2019-12-05 NOTE — Progress Notes (Addendum)
PROGRESS NOTE                                                                                                                                                                                                             Patient Demographics:    Patrick Ibarra, is a 39 y.o. male, DOB - 24-Oct-1980, WPY:[id number]  Outpatient Primary MD for the patient is Ladell Pier, MD   Admit date - 12/02/2019   LOS - 3  Chief Complaint  Patient presents with  . Shortness of Breath       Brief Narrative: Patient is a 39 y.o. male with PMHx of-used to be on hemodialysis-but elected to stop hemodialysis l and pursue holistic treatment-has had recurrent hospitalizations since January related to missed HD-presented with acute respiratory distress requiring emergent intubation and hemodialysis.  See below for further details  Significant Events: 4/1>> admit to [facility] ICU for severe respiratory distress due to missed HD-required intubation and emergent HD.  3/1-3/20>> admit to [facility] ICU-volume overload, hyperkalemia, bradycardia with sine wave, hemoglobin of 4.8 and acute hypoxic respiratory failure secondary to pulmonary pneumonia.  Refused outpatient HD-refused tunneled HD catheter. Wanted to pursue Holistic approach  1/29-2/2>> Admit for hypoxic respiratory failure, hyperkalemia, metabolic acidosis-requiring emergent HD-left AMA-did not want any further HD-as patient wanted to pursue holistic therapy.  Last HD on 2/1.  Microbiology data: 4/1>> blood culture: No growth so far 3/16>> COVID-19 positive  COVID-19 medications Remdesivir: 3/15>>3/19  DVT prophylaxis: Subcutaneous heparin  Procedures: ETT 4/1>> 4/2  Left femoral hemodialysis catheter 4/1  Consults: PCCM, nephrology   Subjective:   Lying comfortably in bed-STILL undecided about outpatient hemodialysis, but has agreed (so far) for insertion of tunneled HD catheter.   Assessment  & Plan :   Acute Hypoxic Resp Failure due to pulmonary edema in the setting of volume overload due to missed HD: Improved-with HD-on room air  COVID-19 pneumonia: Remains on room air-was treated last admission-does not require any treatment at this point.  Fever: afebrile  O2 requirements:  SpO2: 99 % O2 Flow Rate (L/min): 2 L/min FiO2 (%): 30 %   COVID-19 Labs: No results for input(s): DDIMER, FERRITIN, LDH, CRP in the last 72 hours.     Component Value Date/Time   BNP >4,500.0 (H) 12/02/2019 0849    Recent Labs  Lab 12/02/19 1315  PROCALCITON 1.04    Lab Results  Component Value Date   SARSCOV2NAA POSITIVE (A) 11/16/2019   [place] NEGATIVE 10/01/2019   [place] NEGATIVE 09/26/2019   [place] NEGATIVE 09/25/2019     Prone/Incentive Spirometry: encouraged  incentive spirometry use 3-4/hour.  Hyperkalemia: Resolved-after emergent HD.  ESRD: Longstanding issue where patient has refused outpatient hemodialysis in favor of a holistic treatment.  This is his third admission this year in extremis !!  Long discussion with patient today-he is still undecided about long-term outpatient hemodialysis-however has consented to placement of a tunneled hemodialysis catheter.  When asked-why he was not pursuing hemodialysis-he said it was not convenient for his lifestyle.  He is completely awake-alert-answering rest of my questions appropriately.  He seems to understand the rationale/benefit of dialysis, and the risks which include death and significant disability.  He very well understands that this is his third admission in extremis due to missed HD-and the holistic approach that he has been following has clearly not worked.  I subsequently called spouse-and have explained that patient needs to be compliant with hemodialysis-otherwise he is at risk of death and significant disability.  She understands as well-claims that she will talk with him as  well.  Transaminitis: Likely from congestion in the setting of volume overload.  Follow  Severe anemia: Secondary to ESRD-noncompliant with HD and missing Aranesp injections.  Hemoglobin stable-transfuse if less than 7.   Hypertension: Controlled-continue with amlodipine, Coreg  DM-2: Change to SSI-sensitive scale and follow CBGs.  Recent Labs    12/04/19 2340 12/05/19 0353 12/05/19 0727  GLUCAP 95 103* 92   Obesity: Estimated body mass index is 28.36 kg/m as calculated from the following:   Height as of 11/10/19: '6\' 1"'  (1.854 m).   Weight as of this encounter: 97.5 kg.    ABG:    Component Value Date/Time   PHART 7.458 (H) 12/03/2019 0357   PCO2ART 33.1 12/03/2019 0357   PO2ART 80.0 (L) 12/03/2019 0357   HCO3 23.4 12/03/2019 0357   TCO2 24 12/03/2019 0357   ACIDBASEDEF 3.0 (H) 12/02/2019 1244   O2SAT 97.0 12/03/2019 0357    Vent Settings: N/A  Condition -stable but with very poor long-term prognosis-risk for sudden death and disability if he continues to refuse hemodialysis.  Family Communication  : Spouse over the phone on 4/4  Code Status :  Full Code  Diet :  Diet Order            Diet renal with fluid restriction Fluid restriction: 1200 mL Fluid; Room service appropriate? Yes; Fluid consistency: Thin  Diet effective now               Disposition Plan  :  Remain hospitalized  Barriers to discharge: Noncompliance to ESRD-needs tunneled hemodialysis catheter placed before consideration of discharge  Antimicorbials  :    Anti-infectives (From admission, onward)   None      Inpatient Medications  Scheduled Meds: . amLODipine  10 mg Oral Daily  . carvedilol  12.5 mg Oral BID WC  . chlorhexidine gluconate (MEDLINE KIT)  15 mL Mouth Rinse BID  . Chlorhexidine Gluconate Cloth  6 each Topical Q0600  . Chlorhexidine Gluconate Cloth  6 each Topical Q0600  . Chlorhexidine Gluconate Cloth  6 each Topical Q0600  . darbepoetin (ARANESP) injection -  DIALYSIS  60 mcg Intravenous Q Fri-HD  . heparin injection (subcutaneous)  5,000 Units Subcutaneous Q8H  . insulin aspart  0-9 Units Subcutaneous Q4H  . multivitamin  1 tablet Per Tube QHS  . pantoprazole  40 mg Oral QHS   Continuous Infusions: . sodium chloride    . sodium chloride    . sodium chloride Stopped (12/03/19 0430)   PRN Meds:.sodium chloride, sodium chloride, alteplase, heparin, labetalol, midazolam   Time Spent in minutes  25  See all Orders from today for further details   Oren Binet M.D on 12/05/2019 at 10:09 AM  To page go to [email or web address] - use universal password  [facility] -  Office  [phone]    Objective:   Vitals:   12/05/19 0600 12/05/19 0700 12/05/19 0800 12/05/19 0900  BP: (!) 136/92 (!) 156/94 (!) 154/86 (!) 150/90  Pulse: 93 92 (!) 103 97  Resp: 17 19 (!) 22 20  Temp:      TempSrc:      SpO2: 100% 98% 100% 99%  Weight:        Wt Readings from Last 3 Encounters:  12/05/19 97.5 kg  11/20/19 107.6 kg  11/10/19 112.5 kg     Intake/Output Summary (Last 24 hours) at 12/05/2019 1009 Last data filed at 12/04/2019 2200 Gross per 24 hour  Intake 640 ml  Output 4000 ml  Net -3360 ml     Physical Exam Gen Exam:Alert awake-not in any distress HEENT:atraumatic, normocephalic Chest: B/L clear to auscultation anteriorly CVS:S1S2 regular Abdomen:soft non tender, non distended Extremities:no edema Neurology: Non focal Skin: no rash   Data Review:    CBC Recent Labs  Lab 12/02/19 0849 12/02/19 0904 12/02/19 1315 12/03/19 0357 12/03/19 0428 12/04/19 0801 12/05/19 0756  WBC 8.1  --  13.1*  --  4.6 7.3 6.5  HGB 8.1*   < > 8.7* 6.8* 7.5* 7.5* 7.2*  HCT 26.1*   < > 27.5* 20.0* 23.9* 24.6* 22.9*  PLT 218  --  250  --  188 241 237  MCV 92.2  --  91.7  --  90.9 92.5 91.6  MCH 28.6  --  29.0  --  28.5 28.2 28.8  MCHC 31.0  --  31.6  --  31.4 30.5 31.4  RDW 16.1*  --  16.1*  --  15.8* 15.8* 15.5  LYMPHSABS  --   --   --    --   --  1.9  --   MONOABS  --   --   --   --   --  0.5  --   EOSABS  --   --   --   --   --  0.1  --   BASOSABS  --   --   --   --   --  0.0  --    < > = values in this interval not displayed.    Chemistries  Recent Labs  Lab 12/02/19 0849 12/02/19 0904 12/02/19 1315 12/03/19 0357 12/03/19 0428 12/03/19 0934 12/04/19 0801 12/05/19 0756  NA 139   < > 139 134* 137  --  139 139  K 6.0*   < > 5.7* 5.7* 5.8*  --  4.5 4.1  CL 97*  --  96*  --  95*  --  96* 98  CO2 16*  --  20*  --  21*  --  27 27  GLUCOSE 99  --  117*  --  99  --  92 90  BUN 127*  --  127*  --  78*  --  50* 36*  CREATININE 29.15*  --  29.42*  --  19.86*  --  14.22* 10.81*  CALCIUM 8.8*  --  9.1  --  8.9  --  8.4* 7.9*  MG  --   --  3.1*  --   --  2.4  --   --   AST 18  --  20  --   --   --   --   --   ALT 16  --  15  --   --   --   --   --   ALKPHOS 46  --  48  --   --   --   --   --   BILITOT 2.3*  --  2.2*  --   --   --   --   --    < > = values in this interval not displayed.   ------------------------------------------------------------------------------------------------------------------ Recent Labs    12/02/19 1315  TRIG 204*    Lab Results  Component Value Date   HGBA1C 5.4 12/03/2019   ------------------------------------------------------------------------------------------------------------------ No results for input(s): TSH, T4TOTAL, T3FREE, THYROIDAB in the last 72 hours.  Invalid input(s): FREET3 ------------------------------------------------------------------------------------------------------------------ No results for input(s): VITAMINB12, FOLATE, FERRITIN, TIBC, IRON, RETICCTPCT in the last 72 hours.  Coagulation profile Recent Labs  Lab 12/02/19 1315  INR 1.3*    No results for input(s): DDIMER in the last 72 hours.  Cardiac Enzymes No results for input(s): CKMB, TROPONINI, MYOGLOBIN in the last 168 hours.  Invalid input(s):  CK ------------------------------------------------------------------------------------------------------------------    Component Value Date/Time   BNP >4,500.0 (H) 12/02/2019 0849    Micro Results Recent Results (from the past 240 hour(s))  Culture, blood (routine x 2)     Status: None (Preliminary result)   Collection Time: 12/02/19  1:15 PM   Specimen: BLOOD RIGHT HAND  Result Value Ref Range Status   Specimen Description BLOOD RIGHT HAND  Final   Special Requests   Final    BOTTLES DRAWN AEROBIC AND ANAEROBIC Blood Culture adequate volume   Culture   Final    NO GROWTH 3 DAYS Performed at [facility], [place] [address] [place], [place] [address]    Report Status PENDING  Incomplete  Culture, blood (routine x 2)     Status: None (Preliminary result)   Collection Time: 12/02/19  1:20 PM   Specimen: BLOOD RIGHT HAND  Result Value Ref Range Status   Specimen Description BLOOD RIGHT HAND  Final   Special Requests   Final    BOTTLES DRAWN AEROBIC AND ANAEROBIC Blood Culture adequate volume   Culture   Final    NO GROWTH 3 DAYS Performed at [facility], [place] [address] [place], [place] [address]    Report Status PENDING  Incomplete    Radiology Reports DG Abd 1 View  Result Date: 12/02/2019 CLINICAL DATA:  Feeding tube placement. EXAM: ABDOMEN - 1 VIEW COMPARISON:  Radiograph 11/16/2019 FINDINGS: Tip and side port of the enteric tube below the diaphragm in the stomach. Nonobstructive bowel gas pattern. Right femoral catheter tip is in the region of the lower IVC. IMPRESSION: Tip and side port of the enteric tube below the diaphragm in the stomach. Electronically Signed   By: Keith Rake M.D.   On: 12/02/2019 12:40   DG Abdomen 1 View  Result Date: 11/16/2019 CLINICAL DATA:  NG tube placement. EXAM: PORTABLE CHEST 1 VIEW COMPARISON:  One-view chest x-ray 11/16/2019 FINDINGS: Side port of the NG tube is in the stomach. Bowel gas pattern is unremarkable.  IMPRESSION: Side port  of the NG tube is in the stomach. Electronically Signed   By: San Morelle M.D.   On: 11/16/2019 04:54   Portable Chest x-ray  Result Date: 12/02/2019 CLINICAL DATA:  Endotracheal tube placement. COVID positive 8 days ago. EXAM: PORTABLE CHEST 1 VIEW COMPARISON:  Radiograph earlier today. FINDINGS: Endotracheal tube tip 8 cm from the carina at the level of the clavicular heads. Enteric tube in place with tip below the diaphragm. Similar cardiomegaly. Bilateral pulmonary opacities with a slight perihilar predominance, improved aeration of the left lung base, otherwise unchanged. No pneumothorax. Small pleural effusions, improved on the left. No acute osseous abnormalities are seen. IMPRESSION: 1. Endotracheal tube tip 8 cm from the carina at the level of the clavicular heads. 2. Heterogeneous bilateral pulmonary opacities, may represent multifocal pneumonia in the setting of COVID versus pulmonary edema, or combination thereof. There is improved left basilar aeration from exam earlier today. 3. Unchanged cardiomegaly.  Small pleural effusions. Electronically Signed   By: Keith Rake M.D.   On: 12/02/2019 12:43   DG Chest Port 1 View  Result Date: 12/02/2019 CLINICAL DATA:  Shortness of breath EXAM: PORTABLE CHEST 1 VIEW COMPARISON:  November 16, 2019 FINDINGS: There is extensive airspace opacity throughout the lungs bilaterally with slight increase in airspace opacity in the upper lobes and more extensive airspace opacity in the left base compared to most recent study. There is a small left pleural effusion. Heart is upper normal in size with pulmonary vascularity normal. No adenopathy. IMPRESSION: Widespread airspace opacity bilaterally, consistent with multifocal pneumonia with overall progression of infiltrate compared to most recent study. Small left pleural effusion. Stable cardiac prominence. No adenopathy demonstrable radiography. Electronically Signed   By: Lowella Grip III M.D.   On: 12/02/2019 08:42   DG Chest Port 1 View  Result Date: 11/16/2019 CLINICAL DATA:  NG tube placement. EXAM: PORTABLE CHEST 1 VIEW COMPARISON:  One-view chest x-ray 11/16/2019 FINDINGS: Side port of the NG tube is in the stomach. Bowel gas pattern is unremarkable. IMPRESSION: Side port of the NG tube is in the stomach. Electronically Signed   By: San Morelle M.D.   On: 11/16/2019 04:54

## 2019-12-05 NOTE — Plan of Care (Signed)
  Problem: Education: Goal: Knowledge of risk factors and measures for prevention of condition will improve Outcome: Progressing   Problem: Coping: Goal: Psychosocial and spiritual needs will be supported Outcome: Progressing   

## 2019-12-05 NOTE — Progress Notes (Signed)
Pt asleep with mouth open, desats to 83%, 2L [place] applied.  Will continue to monitor.

## 2019-12-05 NOTE — Progress Notes (Addendum)
[facility] Progress Note  Subjective:  Patient not examined today directly given COVID-19 + status, utilizing data taken from chart +/- discussions w/ providers and staff.    Vitals:   12/05/19 0900 12/05/19 1000 12/05/19 1100 12/05/19 1200  BP: (!) 150/90 (!) 159/86 (!) 158/93 (!) 145/82  Pulse: 97 95 94 95  Resp: '20 19 17 ' (!) 22  Temp:      TempSrc:      SpO2: 99% 99% 99% 99%  Weight:        Exam:  Patient not examined today directly given COVID-19 + status, utilizing data taken from chart +/- discussions w/ providers and staff.        I/O - net 1.3L in and 10.5 L out, net negative 9.1 L   Wt;s 109 kg admit >> 97 kg    Creat 29 > 14 > 10 today    Assessment/ Plan: #Acute hypoxic respiratory failure -Secondary to fluid overload as well as Covid pneumonia - extubated on 4/2 - stable from resp standpoint, down 12kg from admission  #End-stage renal disease - no outpatient schedule given gross noncompliance and no current clinic  - sp HD x 3 here w/ resolution of azotemia, next HD Tuesday most likely -Consulted IR for tunneled dialysis catheter. We have discussed the patient that committing to continuing dialysis is necessary. If he is not willing to continue with hemodialysis it would be appropriate to consult palliative care.  He did agree to a tunneled catheter during our conversation in the ER. Have consulted IR for this, await their input, prob Monday.    #COVID-19 pneumonia -Therapies per primary team  #Hypertension - BP's are good, down 12kg from admit wt, min edema on exam yest  #Anemia of CKD - PRBC's per primary team   - Got aranesp 60 mcg on 4/2  #Noncompliance - Currently critically ill.  Goals of care discussions critical.  If he is ultimately not willing to continue with routine three times a week hemodialysis it would be appropriate to refer to palliative care.  Have requested consult w/ palliative order given gross  noncompliance and poor insight.   # Secondary hyperparathyroidism and metabolic bone disease - hyperphos - HD + binders - intact PTH pending  He is critically ill and we are concerned about high risk of readmission and/or mortality given his past noncompliance   Rob Valeen Borys 12/05/2019, 1:03 PM   Recent Labs  Lab 12/04/19 0801 12/05/19 0756  K 4.5 4.1  BUN 50* 36*  CREATININE 14.22* 10.81*  CALCIUM 8.4* 7.9*  PHOS 7.9* 5.3*  HGB 7.5* 7.2*   Inpatient medications: . amLODipine  10 mg Oral Daily  . carvedilol  12.5 mg Oral BID WC  . chlorhexidine gluconate (MEDLINE KIT)  15 mL Mouth Rinse BID  . Chlorhexidine Gluconate Cloth  6 each Topical Q0600  . Chlorhexidine Gluconate Cloth  6 each Topical Q0600  . Chlorhexidine Gluconate Cloth  6 each Topical Q0600  . darbepoetin (ARANESP) injection - DIALYSIS  60 mcg Intravenous Q Fri-HD  . heparin injection (subcutaneous)  5,000 Units Subcutaneous Q8H  . insulin aspart  0-9 Units Subcutaneous Q4H  . multivitamin  1 tablet Per Tube QHS  . pantoprazole  40 mg Oral QHS   . sodium chloride    . sodium chloride    . sodium chloride Stopped (12/03/19 0430)   sodium chloride, sodium chloride, alteplase, heparin, labetalol, midazolam

## 2019-12-06 ENCOUNTER — Inpatient Hospital Stay (HOSPITAL_COMMUNITY): Payer: Medicare Other

## 2019-12-06 ENCOUNTER — Ambulatory Visit: Payer: Medicare Other | Admitting: Internal Medicine

## 2019-12-06 DIAGNOSIS — Z7189 Other specified counseling: Secondary | ICD-10-CM

## 2019-12-06 DIAGNOSIS — Z515 Encounter for palliative care: Secondary | ICD-10-CM

## 2019-12-06 HISTORY — PX: IR US GUIDE VASC ACCESS RIGHT: IMG2390

## 2019-12-06 HISTORY — PX: IR FLUORO GUIDE CV LINE RIGHT: IMG2283

## 2019-12-06 LAB — RENAL FUNCTION PANEL
Albumin: 2.3 g/dL — ABNORMAL LOW (ref 3.5–5.0)
Anion gap: 16 — ABNORMAL HIGH (ref 5–15)
BUN: 46 mg/dL — ABNORMAL HIGH (ref 6–20)
CO2: 24 mmol/L (ref 22–32)
Calcium: 7.8 mg/dL — ABNORMAL LOW (ref 8.9–10.3)
Chloride: 100 mmol/L (ref 98–111)
Creatinine, Ser: 13.51 mg/dL — ABNORMAL HIGH (ref 0.61–1.24)
GFR calc Af Amer: 5 mL/min — ABNORMAL LOW (ref >60)
GFR calc non Af Amer: 4 mL/min — ABNORMAL LOW (ref >60)
Glucose, Bld: 91 mg/dL (ref 70–99)
Phosphorus: 5 mg/dL — ABNORMAL HIGH (ref 2.5–4.6)
Potassium: 3.8 mmol/L (ref 3.5–5.1)
Sodium: 140 mmol/L (ref 135–145)

## 2019-12-06 LAB — GLUCOSE, CAPILLARY
Glucose-Capillary: 78 mg/dL (ref 70–99)
Glucose-Capillary: 87 mg/dL (ref 70–99)
Glucose-Capillary: 88 mg/dL (ref 70–99)
Glucose-Capillary: 90 mg/dL (ref 70–99)
Glucose-Capillary: 91 mg/dL (ref 70–99)
Glucose-Capillary: 98 mg/dL (ref 70–99)

## 2019-12-06 LAB — CBC
HCT: 22.6 % — ABNORMAL LOW (ref 39.0–52.0)
Hemoglobin: 7 g/dL — ABNORMAL LOW (ref 13.0–17.0)
MCH: 28.2 pg (ref 26.0–34.0)
MCHC: 31 g/dL (ref 30.0–36.0)
MCV: 91.1 fL (ref 80.0–100.0)
Platelets: 223 10*3/uL (ref 150–400)
RBC: 2.48 MIL/uL — ABNORMAL LOW (ref 4.22–5.81)
RDW: 15.6 % — ABNORMAL HIGH (ref 11.5–15.5)
WBC: 6.8 10*3/uL (ref 4.0–10.5)
nRBC: 0 % (ref 0.0–0.2)

## 2019-12-06 MED ORDER — GELATIN ABSORBABLE 12-7 MM EX MISC
CUTANEOUS | Status: AC
  Filled 2019-12-06: qty 1

## 2019-12-06 MED ORDER — MIDAZOLAM HCL 2 MG/2ML IJ SOLN
INTRAMUSCULAR | Status: AC
  Filled 2019-12-06: qty 2

## 2019-12-06 MED ORDER — CHLORHEXIDINE GLUCONATE CLOTH 2 % EX PADS
6.0000 | MEDICATED_PAD | Freq: Every day | CUTANEOUS | Status: DC
  Administered 2019-12-07: 07:00:00 6 via TOPICAL

## 2019-12-06 MED ORDER — CEFAZOLIN SODIUM-DEXTROSE 2-4 GM/100ML-% IV SOLN
2.0000 g | INTRAVENOUS | Status: AC
  Administered 2019-12-06: 17:00:00 2 g via INTRAVENOUS

## 2019-12-06 MED ORDER — FENTANYL CITRATE (PF) 100 MCG/2ML IJ SOLN
INTRAMUSCULAR | Status: AC
  Filled 2019-12-06: qty 2

## 2019-12-06 MED ORDER — HEPARIN SODIUM (PORCINE) 5000 UNIT/ML IJ SOLN
5000.0000 [IU] | Freq: Three times a day (TID) | INTRAMUSCULAR | Status: DC
  Administered 2019-12-07 – 2019-12-11 (×11): 5000 [IU] via SUBCUTANEOUS
  Filled 2019-12-06 (×11): qty 1

## 2019-12-06 MED ORDER — FENTANYL CITRATE (PF) 100 MCG/2ML IJ SOLN
INTRAMUSCULAR | Status: AC | PRN
  Administered 2019-12-06: 50 ug via INTRAVENOUS
  Administered 2019-12-06 (×2): 25 ug via INTRAVENOUS

## 2019-12-06 MED ORDER — LIDOCAINE HCL 1 % IJ SOLN
INTRAMUSCULAR | Status: AC
  Filled 2019-12-06: qty 20

## 2019-12-06 MED ORDER — HEPARIN SODIUM (PORCINE) 1000 UNIT/ML IJ SOLN
INTRAMUSCULAR | Status: AC
  Filled 2019-12-06: qty 1

## 2019-12-06 MED ORDER — SODIUM CHLORIDE 0.9 % IV SOLN
INTRAVENOUS | Status: AC | PRN
  Administered 2019-12-06: 10 mL/h via INTRAVENOUS

## 2019-12-06 MED ORDER — NEPRO/CARBSTEADY PO LIQD
237.0000 mL | Freq: Every day | ORAL | Status: DC
  Administered 2019-12-07 – 2019-12-10 (×6): 237 mL via ORAL

## 2019-12-06 MED ORDER — MIDAZOLAM HCL 2 MG/2ML IJ SOLN
INTRAMUSCULAR | Status: AC | PRN
  Administered 2019-12-06: 1 mg via INTRAVENOUS
  Administered 2019-12-06 (×2): 0.5 mg via INTRAVENOUS

## 2019-12-06 MED ORDER — HEPARIN SODIUM (PORCINE) 1000 UNIT/ML IJ SOLN
INTRAMUSCULAR | Status: AC | PRN
  Administered 2019-12-06: 3.2 mL via INTRAVENOUS

## 2019-12-06 MED ORDER — CEFAZOLIN SODIUM-DEXTROSE 2-4 GM/100ML-% IV SOLN
INTRAVENOUS | Status: AC
  Filled 2019-12-06: qty 100

## 2019-12-06 MED ORDER — PRO-STAT SUGAR FREE PO LIQD
30.0000 mL | Freq: Every day | ORAL | Status: DC
  Administered 2019-12-08 – 2019-12-09 (×2): 30 mL via ORAL
  Filled 2019-12-06 (×2): qty 30

## 2019-12-06 MED ORDER — LIDOCAINE HCL 1 % IJ SOLN
INTRAMUSCULAR | Status: AC | PRN
  Administered 2019-12-06: 5 mL

## 2019-12-06 NOTE — Care Management (Signed)
Pt declined for CM to arrange outpt palliative services.   Palliative NP aware

## 2019-12-06 NOTE — Progress Notes (Signed)
[facility] Progress Note  Subjective:  Patient not examined today directly given COVID-19 + status, utilizing data taken from chart +/- discussions w/ providers and staff.    Vitals:   12/06/19 0658 12/06/19 0754 12/06/19 1158 12/06/19 1611  BP: (!) 142/80 (!) 171/104 (!) 163/114 (!) 150/101  Pulse: (!) 103 (!) 105 95 (!) 102  Resp: (!) 21 (!) _0 Temp: 97.6 F (36.4 C) 98.2 F (36.8 C) 98 F (36.7 C) 98 F (36.7 C)  TempSrc: Oral Axillary Axillary Oral  SpO2: 97% 98% 100% 100%  Weight:      Height:        Exam:  Patient not examined today directly given COVID-19 + status, utilizing data taken from chart +/- discussions w/ providers and staff.        I/O - net 1.3L in and 10.5 L out, net negative 9.1 L   Wt;s 109 kg admit >> 97 kg    Creat 29 > 14 > 10 today    Assessment/ Plan: #Acute hypoxic respiratory failure -Secondary to fluid overload as well as Covid pneumonia - extubated on 4/2 - stable from resp standpoint, down 12kg from admission  #End-stage renal disease - no outpatient schedule given gross noncompliance and no current clinic. Next HD Tuesday.  - sp HD x 3 here w/ resolution of azotemia, next HD Tuesday most likely -Consulted IR for tunneled dialysis catheter. If he is not willing to continue with hemodialysis it would be appropriate to consult palliative care.  He did agree to a tunneled catheter during our conversation in the ER >> patient is in IR this afternoon getting TDC placed by IR.  Appreciate assistance.   #COVID-19 pneumonia -Therapies per primary team  #Hypertension - BP's are good, down 12kg from admit wt, min edema on exam yest  #Anemia of CKD - PRBC's per primary team   - Got aranesp 60 mcg on 4/2  #Noncompliance - Currently critically ill.  Goals of care discussions critical.  If he is ultimately not willing to continue with routine three times a week hemodialysis it would be appropriate to refer to  palliative care.  Have requested consult w/ palliative order given gross noncompliance and poor insight.   # Secondary hyperparathyroidism and metabolic bone disease - hyperphos - HD + binders - intact PTH pending  He is critically ill and we are concerned about high risk of readmission and/or mortality given his past noncompliance   Patrick Ibarra 12/06/2019, 4:55 PM   Recent Labs  Lab 12/05/19 0756 12/06/19 0257  K 4.1 3.8  BUN 36* 46*  CREATININE 10.81* 13.51*  CALCIUM 7.9* 7.8*  PHOS 5.3* 5.0*  HGB 7.2* 7.0*   Inpatient medications: . amLODipine  10 mg Oral Daily  . carvedilol  12.5 mg Oral BID WC  . chlorhexidine gluconate (MEDLINE KIT)  15 mL Mouth Rinse BID  . Chlorhexidine Gluconate Cloth  6 each Topical Q0600  . Chlorhexidine Gluconate Cloth  6 each Topical Q0600  . Chlorhexidine Gluconate Cloth  6 each Topical Q0600  . darbepoetin (ARANESP) injection - DIALYSIS  60 mcg Intravenous Q Fri-HD  . [START ON 12/07/2019] feeding supplement (NEPRO CARB STEADY)  237 mL Oral Daily  . [START ON 12/07/2019] feeding supplement (PRO-STAT SUGAR FREE 64)  30 mL Oral Q1400  . [START ON 12/07/2019] heparin injection (subcutaneous)  5,000 Units Subcutaneous Q8H  . insulin aspart  0-9 Units Subcutaneous Q4H  . multivitamin  1 tablet Per  Tube QHS  . pantoprazole  40 mg Oral QHS   . sodium chloride    . sodium chloride    . sodium chloride Stopped (12/03/19 0430)  .  ceFAZolin (ANCEF) IV     sodium chloride, sodium chloride, alteplase, heparin, heparin, labetalol, lidocaine, midazolam

## 2019-12-06 NOTE — Progress Notes (Signed)
Nutrition Follow-up  DOCUMENTATION CODES:   Not applicable  INTERVENTION:  Once diet advances,  Provide Nepro Shake po once daily, each supplement provides 425 kcal and 19 grams protein.  Provide 30 ml Prostat po once daily, each supplement provides 100 kcal and 15 grams of protein.   NUTRITION DIAGNOSIS:   Inadequate oral intake related to acute illness as evidenced by NPO status; ongoing  GOAL:   Patient will meet greater than or equal to 90% of their needs  MONITOR:   Diet advancement, Supplement acceptance, Labs, I & O's, Skin, Weight trends  REASON FOR ASSESSMENT:   Ventilator, Consult Enteral/tube feeding initiation and management  ASSESSMENT:   39 yo male admitted with acute respiratory failure in setting of volume overload from non-compliance with HD; pt also COVID-19 + diagnosed on 11/16/19. PMH includes DM, obesity, HTN, ESRD Extubated 4/2.  RD working remotely.  Pt is currently NPO with plans for tunneled dialysis catheter placement today. Pt previously on a renal diet with 100% meal completion. RD to order nutritional supplements to aid in caloric and protein needs. RN to provide once pt advances as appropriate.  Labs and medications reviewed.   Diet Order:   Diet Order            Diet NPO time specified Except for: Sips with Meds  Diet effective now              EDUCATION NEEDS:   Not appropriate for education at this time  Skin:  Skin Assessment: Reviewed RN Assessment  Last BM:  4/3  Height:   Ht Readings from Last 1 Encounters:  12/05/19 6\' 1"  (1.854 m)    Weight:   Wt Readings from Last 1 Encounters:  12/05/19 97.5 kg    BMI:  Body mass index is 28.36 kg/m.  Estimated Nutritional Needs:   Kcal:  2300-2500  Protein:  120-130 grams  Fluid:  1000 mL plus UOP   Corrin Parker, MS, RD, LDN RD pager number/after hours weekend pager number on Amion.

## 2019-12-06 NOTE — Consult Note (Signed)
Consultation Note Date: 12/06/2019   Patient Name: Patrick Ibarra  DOB: July 16, 1981  MRN: [id number]  Age / Sex: 39 y.o., male  PCP: Ladell Pier, MD Referring Physician: Jonetta Osgood, MD  Reason for Consultation: Establishing goals of care Ongoing discussions pertaining to hemodiaylsis  HPI/Patient Profile:  Patient is a 39 y.o. male with PMHx of-used to be on hemodialysis-but elected to stop hemodialysis  and pursue holistic treatment-has had recurrent hospitalizations since January related to missed HD-presented with acute respiratory distress requiring emergent intubation and hemodialysis. On this admission he came in with acute hypoxic respiratory failure due to fluid overload from non-adherence to dialysis recommendations.   Patrick Ibarra is known to the Palliative care service as he was seen in July of 2018 and February of this year for ongoing conversations related to his kidney function. More recently he had been resistant to outpatient dialysis in the setting of needing long term access. He and his wife had decided that a more holistic approach would be favorable. It was explained to the both of them that in his case this would likely be unsuccessful and it was recommended to continue with the medical plan that was recommended by nephrology. Despite this the family expressed a "need" to try the alternatives first.  I saw Patrick Ibarra on 3/20 due to him not having received HD for a six week period of time resulting in hyperkalemia requiring emergent dialysis. He at that time wants to continue trying alternative therapies to correct his ESRD despite the advisement of all involved medical specialists.   We were asked to see Patrick Ibarra to discuss goals of care related to hemodialysis.  He states he was dieting and using vitamins and mineral supplements since last admission. He has lost some weight. He states  that he got off of his eating  routine and this contributed to his fluid retention.  Clinical Assessment and Goals of Care:  I have reviewed medical records including EPIC notes, labs and imaging, received report from bedside RN.   I met with Patrick Ibarra to further discuss diagnosis prognosis, GOC, EOL wishes, disposition and options.   I introduced Palliative Medicine as specialized medical care for people living with serious illness. It focuses on providing relief from the symptoms and stress of a serious illness. The goal is to improve quality of life for both the patient and the family.   I asked Patrick Ibarra to share a little about himself with me. He has been married to his wife, Patrick Ibarra since 2012. Patrick Ibarra is eight years older than Patrick Ibarra and has two children a son and a daughter. His son is home form college due to [place] and his daughter is living with him and his wife as her husband is deployed. Patrick Ibarra states that he considers his step children his children. They have also recently gains custody of Patrick Ibarra's nephew who is 11 years old. His step son goes to school in [place] and plays university football. His stepdaughter has a two year old daughter  herself. 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 He is a man of nondenominational faith.   Concepts specific to code status and rehospitalization was had. [place] is very clear that he would want to be FULL CODE. He would want all interventions offered if he were in a life threatening situation. He realizes that he was very close to death prior to coming in and is very aware that he could go into cardiac arrest.   He states he is not ruling out hemodialysis and has agreed to a tunneled catheter, to be inserted today.  He states he still wants to try "holistic" therapies. We discussed at length the risk of continued fluid retention, arrhythmias and potential for death if her does start and continue a hemodialysis regimen.   On prior  hospitalization he agreed to outpatient Palliative follow up and is agreeable to continue with outpatient Palliative follow up.  Discussed with patient the importance of continued conversation with family and their medical providers regarding overall plan of care and treatment options, ensuring decisions are within the context of the patients values and GOCs.   Decision Maker:  Patient can make decisions for himself however if her were unable to he would rely on his wife, Patrick Ibarra to make decisions on his behalf. Patient is open to completing advance directive/power of attorney paperwork while inpatient.  SUMMARY OF RECOMMENDATIONS  Full Code   TOC --> OP Palliative Follow Up   Chaplain Consult --> Complete advance directive paperwork _________________________________________________________ Code Status/Advance Care Planning:  Full code  Additional Recommendations (Limitations, Scope, Preferences):  Full Scope Treatment  Psycho-social/Spiritual:  Desire for further Chaplaincy support: Yes Additional Recommendations: Disease process support  Prognosis:  Unable to determine  Discharge Planning:   Home with Palliative Services    Primary Diagnoses:  Acute Hypoxic Resp Failure due to pulmonary edema in the setting of volume overload due to missed HD: Improved-with HD-on room air COVID-19 pneumonia: Remains on room air-was treated last admission-does not require any treatment at this point.  I have reviewed the medical record, interviewed the patient and family, and examined the patient. The following aspects are pertinent.      Past Medical History:  Diagnosis Date  . Chronic kidney disease    dialysis M-W-F  . Diabetes mellitus without complication (HCC)    type 2, diet controlled  . Hypertension   . LV dysfunction   . Obesity   Note: Has not been attending dialysis  Social History        Socioeconomic History  . Marital status: Married    Spouse name: Not on file  .  Number of children: Not on file  . Years of education: Not on file  . Highest education level: Not on file  Occupational History  . Not on file  Tobacco Use  . Smoking status: Never Smoker  . Smokeless tobacco: Never Used  Substance and Sexual Activity  . Alcohol use: Not Currently  . Drug use: No  . Sexual activity: Yes  Other Topics Concern  . Not on file  Social History Narrative   He is married with step kids. He works at Design[employer], industrial/product.    Social Determinants of Health      Financial Resource Strain:   . Difficulty of Paying Living Expenses:   Food Insecurity:   . Worried About Charity fundraiser in the Last Year:   . [place] in the Last Year:  Transportation Needs:   . Film/video editor (Medical):   Marland Kitchen Lack of Transportation (Non-Medical):   Physical Activity:   . Days of Exercise per Week:   . Minutes of Exercise per Session:   Stress:   . Feeling of Stress :   Social Connections:   . Frequency of Communication with Friends and Family:   . Frequency of Social Gatherings with Friends and Family:   . Attends Religious Services:   . Active Member of Clubs or Organizations:   . Attends Archivist Meetings:   Marland Kitchen Marital Status:         Family History  Problem Relation Age of Onset  . Hypertension Mother   . Diabetes Mother   . Hypertension Father    Scheduled Meds: . amLODipine  10 mg Oral Daily  . carvedilol  12.5 mg Oral BID WC  . chlorhexidine gluconate (MEDLINE KIT)  15 mL Mouth Rinse BID  . Chlorhexidine Gluconate Cloth  6 each Topical Q0600  . Chlorhexidine Gluconate Cloth  6 each Topical Q0600  . Chlorhexidine Gluconate Cloth  6 each Topical Q0600  . darbepoetin (ARANESP) injection - DIALYSIS  60 mcg Intravenous Q Fri-HD  . [START ON 12/07/2019] heparin injection (subcutaneous)  5,000 Units Subcutaneous Q8H  . insulin aspart  0-9 Units Subcutaneous Q4H  . multivitamin  1 tablet Per Tube QHS  . pantoprazole  40 mg Oral QHS    Continuous Infusions: . sodium chloride    . sodium chloride    . sodium chloride Stopped (12/03/19 0430)  .  ceFAZolin (ANCEF) IV     PRN Meds:.sodium chloride, sodium chloride, alteplase, heparin, labetalol, midazolam  Medications Prior to Admission:   Current Outpatient Medications  Medication Instructions  . Accu-Chek FastClix Lancets MISC Use as instructed to check blood sugar once daily. E11.9  . amLODipine (NORVASC) 10 mg, Oral, Daily  . Blood Glucose Monitoring Suppl (ACCU-CHEK GUIDE ME) w/Device KIT 1 kit, Does not apply, Daily, Use as instructed to check blood sugar once daily. E11.9  . carvedilol (COREG) 12.5 mg, Oral, 2 times daily with meals  . carvedilol (COREG) 6.25 mg, Oral, 2 times daily with meals  . Ferrous Sulfate (FERROUSUL PO) 5 mLs, Oral, Daily after breakfast  . glucose blood (ACCU-CHEK GUIDE) test strip Use as instructed to check blood sugar once daily. E11.9  . insulin aspart (NOVOLOG FLEXPEN) 100 UNIT/ML FlexPen 2 units subcut with meals for BS greater than 150.  Marland Kitchen Insulin Pen Needle (PEN NEEDLES) 31G X 8 MM MISC UAD  . lovastatin (MEVACOR) 20 mg, Oral, Daily at bedtime     Review of Systems  Constitutional: Positive for fatigue.  Neurological: Positive for weakness.  Psych: Anxious for procedures to be completed today  Physical Exam  Vitals and nursing note reviewed.  HENT:  Head: Normocephalic.  Nose: Nose normal.  Mouth: Mucous membranes are moist.  Pupils: Pupils are equal, round, and reactive to light.  Cardiovascular:  Rate and Rhythm: Normal rate and regular rhythm.  Pulmonary:  Effort: Pulmonary effort is normal. RR 12 Musculoskeletal:  General: Normal range of motion.  Skin:  General: Skin is warm.  Neurological:  Mental Status: He is alert and oriented to person, place, and time.  Psychiatric:  Mood and Affect: Mood normal.   Blood pressure (!) 163/114, pulse 95, temperature 98 F (36.7 C), temperature source Axillary, resp.  rate 15, height '6\' 1"'  (1.854 m), weight 97.5 kg, SpO2 100 %.  Pain Score: 0-No pain  SpO2: SpO2: 100%  O2 Flow Rate: .O2 Flow Rate (L/min): Room air IO: Intake/output summary:    Intake/Output Summary (Last 24 hours) at 12/06/2019 1228 Last data filed at 12/06/2019 0800 Gross per 24 hour  Intake 240 ml  Output --  Net 240 ml    Baseline Weight: Weight: 112 kg  Most recent weight: Weight: 97.5 kg Palliative Assessment Data: 80%      Time In: 10:15 Time Out: 11:30 Time Total: 75  Greater than 50% of this time was spent counseling and coordinating care related to the above assessment and plan.  Signed by:  Lindell Spar NP/ Rosezella Rumpf, NP  Please contact Palliative Medicine Team phone at [phone] for questions and concerns.  For individual provider: See Shea Evans

## 2019-12-06 NOTE — Procedures (Signed)
Interventional Radiology Procedure Note  Procedure: Placement of a right IJ approach tunneled HD catheter.  23 cm tip to cuff. .  Tip is positioned at the superior cavoatrial junction and catheter is ready for immediate use.  Complications: None Recommendations:  - Ok to use - Do not submerge - Routine line care   Signed,  Dulcy Fanny. Earleen Newport, DO

## 2019-12-06 NOTE — Consult Note (Signed)
   [facility] Inpatient Consult   12/06/2019  Patrick Ibarra 04-21-1981 [id number]   Patient just recently active with [facility] for chronic disease management services. Patient readmitted prior to [place] NP's outreach assessment but was able to speak with patient's wife. [place] worker has reached patient's wife regarding insurance issues and social security disability issues. Patient is currently listed in EMR demographics as having Medicare and Medicaid.  Patient will receive a post hospital call and will be evaluated for assessments and disease process education.  Patient with ongoing issues with ESRD and need for HD.    Plan: Continue to follow progress and palliative care consults noted.  Follow up with Inpatient Transition Of Care [TOC] team member to make aware that [facility] following.   Of note, [facility] Care Management services does not replace or interfere with any services that are needed or arranged by inpatient [facility] care management team.  For additional questions or referrals please contact:  Natividad Brood, RN BSN [facility] Liaison  [phone] business mobile phone Toll free office [phone]  Fax number: [phone] [email or web address]

## 2019-12-06 NOTE — ACP (Advance Care Planning) (Signed)
    Palliative Medicine Inpatient Consult   ADVANCED CARE PLANNING NOTE        Meeting Participants  Patrick Ibarra (Patient)      Discussion:  Concepts specific to code status and rehospitalization was had. [place] is very clear that he would want to be FULL CODE. He would want all interventions offered if he were in a life threatening situation. He realizes that he was very close to death prior to coming in and is very aware that he could go into cardiac arrest.   He states he is not ruling out hemodialysis and has agreed to a tunneled catheter, to be inserted today.  He states he still wants to try "holistic" therapies. We discussed at length the risk of continued fluid retention, arrhythmias and potential for death if her does start and continue a hemodialysis regimen.   On prior hospitalization he agreed to outpatient Palliative follow up and is agreeable to continue with outpatient Palliative follow up.  Discussed with patient the importance of continued conversation with family and their medical providers regarding overall plan of care and treatment options, ensuring decisions are within the context of the patients values and GOCs.       Recommendations and Plan   Full Code   Time In:  10:00 Time Out:  10:16 Time Total:  30 Greater than 50%  of this time was spent counseling and coordinating care related to the above assessment and plan.   Signed by: Rosezella Rumpf, NP   Please contact Palliative Medicine Team phone at [phone] for questions and concerns.  For individual provider: See Shea Evans

## 2019-12-06 NOTE — Plan of Care (Signed)
  Problem: Education: Goal: Knowledge of risk factors and measures for prevention of condition will improve Outcome: Progressing   Problem: Respiratory: Goal: Will maintain a patent airway Outcome: Progressing   

## 2019-12-06 NOTE — Progress Notes (Signed)
PROGRESS NOTE                                                                                                                                                                                                             Patient Demographics:    Patrick Ibarra, is a 39 y.o. male, DOB - 14-Nov-1980, PIR:[id number]  Outpatient Primary MD for the patient is Ladell Pier, MD   Admit date - 12/02/2019   LOS - 4  Chief Complaint  Patient presents with  . Shortness of Breath       Brief Narrative: Patient is a 39 y.o. male with PMHx of-used to be on hemodialysis-but elected to stop hemodialysis l and pursue holistic treatment-has had recurrent hospitalizations since January related to missed HD-presented with acute respiratory distress requiring emergent intubation and hemodialysis.  See below for further details  Significant Events: 4/1>> admit to [facility] ICU for severe respiratory distress due to missed HD-required intubation and emergent HD.  3/1-3/20>> admit to [facility] ICU-volume overload, hyperkalemia, bradycardia with sine wave, hemoglobin of 4.8 and acute hypoxic respiratory failure secondary to pulmonary pneumonia.  Refused outpatient HD-refused tunneled HD catheter. Wanted to pursue Holistic approach  1/29-2/2>> Admit for hypoxic respiratory failure, hyperkalemia, metabolic acidosis-requiring emergent HD-left AMA-did not want any further HD-as patient wanted to pursue holistic therapy.  Last HD on 2/1.  Microbiology data: 4/1>> blood culture: No growth so far 3/16>> COVID-19 positive  COVID-19 medications Remdesivir: 3/15>>3/19  DVT prophylaxis: Subcutaneous heparin  Procedures: ETT 4/1>> 4/2  Left femoral hemodialysis catheter 4/1  Consults: PCCM, nephrology   Subjective:   Sitting at bedside chair-no major issues overnight.  For tunneled dialysis catheter placement today.   Assessment  & Plan :   Acute Hypoxic  Resp Failure due to pulmonary edema in the setting of volume overload due to missed HD: Improved-with HD-on room air  COVID-19 pneumonia: Remains on room air-was treated last admission-does not require any treatment at this point.  Fever: afebrile  O2 requirements:  SpO2: 98 % O2 Flow Rate (L/min): 2 L/min FiO2 (%): 30 %   COVID-19 Labs: No results for input(s): DDIMER, FERRITIN, LDH, CRP in the last 72 hours.     Component Value Date/Time   BNP >4,500.0 (H) 12/02/2019 0849    Recent Labs  Lab 12/02/19 1315  PROCALCITON 1.04  Lab Results  Component Value Date   [place] (A) 11/16/2019   [place] NEGATIVE 10/01/2019   [place] NEGATIVE 09/26/2019   [place] NEGATIVE 09/25/2019     Prone/Incentive Spirometry: encouraged  incentive spirometry use 3-4/hour.  Hyperkalemia: Resolved-after emergent HD.  ESRD-noncompliant with HD: See prior notes-this morning he seems to be a little bit more accepting of the fact that he probably will require outpatient hemodialysis in order to prevent life-threatening episodes.  He is currently waiting for a tunneled dialysis catheter.  I will continue my efforts to counseling regarding the importance of resuming outpatient hemodialysis.  I have again told him that the holistic method that he tried for the past few months has clearly not worked-as he has had numerous hospitalizations for serious/emergent medical issues due to missed HD.  Transaminitis: Likely from congestion in the setting of volume overload.  Follow  Severe anemia: Secondary to ESRD-noncompliant with HD and missing Aranesp injections.  Hemoglobin stable-transfuse if less than 7.   Hypertension: Controlled-continue with amlodipine, Coreg  DM-2: Change to SSI-sensitive scale and follow CBGs.  Recent Labs    12/05/19 2351 12/06/19 0409 12/06/19 0752  GLUCAP 98 87 78   Obesity: Estimated body mass index is 28.36 kg/m as calculated from the  following:   Height as of this encounter: '6\' 1"'$  (1.854 m).   Weight as of this encounter: 97.5 kg.    ABG:    Component Value Date/Time   PHART 7.458 (H) 12/03/2019 0357   PCO2ART 33.1 12/03/2019 0357   PO2ART 80.0 (L) 12/03/2019 0357   HCO3 23.4 12/03/2019 0357   TCO2 24 12/03/2019 0357   ACIDBASEDEF 3.0 (H) 12/02/2019 1244   O2SAT 97.0 12/03/2019 0357    Vent Settings: N/A  Condition -stable but with very poor long-term prognosis-risk for sudden death and disability if he continues to refuse hemodialysis.  Family Communication  : Left a voicemail for spouse on 4/5.  Code Status :  Full Code  Diet :  Diet Order            Diet NPO time specified Except for: Sips with Meds  Diet effective now               Disposition Plan  :  Remain hospitalized  Barriers to discharge: Noncompliance to ESRD-needs tunneled hemodialysis catheter placed before consideration of discharge  Antimicorbials  :    Anti-infectives (From admission, onward)   Start     Dose/Rate Route Frequency Ordered Stop   12/06/19 0645  ceFAZolin (ANCEF) IVPB 2g/100 mL premix     2 g 200 mL/hr over 30 Minutes Intravenous To Radiology 12/06/19 0636 12/07/19 0645      Inpatient Medications  Scheduled Meds: . amLODipine  10 mg Oral Daily  . carvedilol  12.5 mg Oral BID WC  . chlorhexidine gluconate (MEDLINE KIT)  15 mL Mouth Rinse BID  . Chlorhexidine Gluconate Cloth  6 each Topical Q0600  . Chlorhexidine Gluconate Cloth  6 each Topical Q0600  . Chlorhexidine Gluconate Cloth  6 each Topical Q0600  . darbepoetin (ARANESP) injection - DIALYSIS  60 mcg Intravenous Q Fri-HD  . [START ON 12/07/2019] heparin injection (subcutaneous)  5,000 Units Subcutaneous Q8H  . insulin aspart  0-9 Units Subcutaneous Q4H  . multivitamin  1 tablet Per Tube QHS  . pantoprazole  40 mg Oral QHS   Continuous Infusions: . sodium chloride    . sodium chloride    . sodium chloride Stopped (12/03/19 0430)  .  ceFAZolin  (ANCEF) IV     PRN Meds:.sodium chloride, sodium chloride, alteplase, heparin, labetalol, midazolam   Time Spent in minutes  25  See all Orders from today for further details   Oren Binet M.D on 12/06/2019 at 11:24 AM  To page go to [email or web address] - use universal password  [facility] -  Office  [phone]    Objective:   Vitals:   12/05/19 2200 12/06/19 0408 12/06/19 0658 12/06/19 0754  BP:  (!) 162/94 (!) 142/80 (!) 171/104  Pulse:  (!) 104 (!) 103 (!) 105  Resp:  19 (!) 21 (!) 21  Temp:  99.1 F (37.3 C) 97.6 F (36.4 C) 98.2 F (36.8 C)  TempSrc:  Oral Oral Axillary  SpO2:  96% 97% 98%  Weight: 97.5 kg     Height: 6' 1" (1.854 m)       Wt Readings from Last 3 Encounters:  12/05/19 97.5 kg  11/20/19 107.6 kg  11/10/19 112.5 kg     Intake/Output Summary (Last 24 hours) at 12/06/2019 1124 Last data filed at 12/06/2019 0800 Gross per 24 hour  Intake 240 ml  Output --  Net 240 ml     Physical Exam Gen Exam:Alert awake-not in any distress HEENT:atraumatic, normocephalic Chest: B/L clear to auscultation anteriorly CVS:S1S2 regular Abdomen:soft non tender, non distended Extremities:no edema Neurology: Non focal Skin: no rash   Data Review:    CBC Recent Labs  Lab 12/02/19 1315 12/02/19 1315 12/03/19 0357 12/03/19 0428 12/04/19 0801 12/05/19 0756 12/06/19 0257  WBC 13.1*  --   --  4.6 7.3 6.5 6.8  HGB 8.7*   < > 6.8* 7.5* 7.5* 7.2* 7.0*  HCT 27.5*   < > 20.0* 23.9* 24.6* 22.9* 22.6*  PLT 250  --   --  188 241 237 223  MCV 91.7  --   --  90.9 92.5 91.6 91.1  MCH 29.0  --   --  28.5 28.2 28.8 28.2  MCHC 31.6  --   --  31.4 30.5 31.4 31.0  RDW 16.1*  --   --  15.8* 15.8* 15.5 15.6*  LYMPHSABS  --   --   --   --  1.9  --   --   MONOABS  --   --   --   --  0.5  --   --   EOSABS  --   --   --   --  0.1  --   --   BASOSABS  --   --   --   --  0.0  --   --    < > = values in this interval not displayed.    Chemistries  Recent Labs   Lab 12/02/19 0849 12/02/19 0904 12/02/19 1315 12/02/19 1315 12/03/19 0357 12/03/19 0428 12/03/19 0934 12/04/19 0801 12/05/19 0756 12/06/19 0257  NA 139   < > 139   < > 134* 137  --  139 139 140  K 6.0*   < > 5.7*   < > 5.7* 5.8*  --  4.5 4.1 3.8  CL 97*   < > 96*  --   --  95*  --  96* 98 100  CO2 16*   < > 20*  --   --  21*  --  _0 GLUCOSE 99   < > 117*  --   --  99  --  92 90 91  BUN 127*   < >  127*  --   --  78*  --  50* 36* 46*  CREATININE 29.15*   < > 29.42*  --   --  19.86*  --  14.22* 10.81* 13.51*  CALCIUM 8.8*   < > 9.1  --   --  8.9  --  8.4* 7.9* 7.8*  MG  --   --  3.1*  --   --   --  2.4  --   --   --   AST 18  --  20  --   --   --   --   --   --   --   ALT 16  --  15  --   --   --   --   --   --   --   ALKPHOS 46  --  48  --   --   --   --   --   --   --   BILITOT 2.3*  --  2.2*  --   --   --   --   --   --   --    < > = values in this interval not displayed.   ------------------------------------------------------------------------------------------------------------------ No results for input(s): CHOL, HDL, LDLCALC, TRIG, CHOLHDL, LDLDIRECT in the last 72 hours.  Lab Results  Component Value Date   HGBA1C 5.4 12/03/2019   ------------------------------------------------------------------------------------------------------------------ No results for input(s): TSH, T4TOTAL, T3FREE, THYROIDAB in the last 72 hours.  Invalid input(s): FREET3 ------------------------------------------------------------------------------------------------------------------ No results for input(s): VITAMINB12, FOLATE, FERRITIN, TIBC, IRON, RETICCTPCT in the last 72 hours.  Coagulation profile Recent Labs  Lab 12/02/19 1315  INR 1.3*    No results for input(s): DDIMER in the last 72 hours.  Cardiac Enzymes No results for input(s): CKMB, TROPONINI, MYOGLOBIN in the last 168 hours.  Invalid input(s):  CK ------------------------------------------------------------------------------------------------------------------    Component Value Date/Time   BNP >4,500.0 (H) 12/02/2019 0849    Micro Results Recent Results (from the past 240 hour(s))  Culture, blood (routine x 2)     Status: None (Preliminary result)   Collection Time: 12/02/19  1:15 PM   Specimen: BLOOD RIGHT HAND  Result Value Ref Range Status   Specimen Description BLOOD RIGHT HAND  Final   Special Requests   Final    BOTTLES DRAWN AEROBIC AND ANAEROBIC Blood Culture adequate volume   Culture   Final    NO GROWTH 3 DAYS Performed at [facility], [place] [address] [place], [place] [address]    Report Status PENDING  Incomplete  Culture, blood (routine x 2)     Status: None (Preliminary result)   Collection Time: 12/02/19  1:20 PM   Specimen: BLOOD RIGHT HAND  Result Value Ref Range Status   Specimen Description BLOOD RIGHT HAND  Final   Special Requests   Final    BOTTLES DRAWN AEROBIC AND ANAEROBIC Blood Culture adequate volume   Culture   Final    NO GROWTH 3 DAYS Performed at [facility], [place] [address] [place], [place] [address]    Report Status PENDING  Incomplete    Radiology Reports DG Abd 1 View  Result Date: 12/02/2019 CLINICAL DATA:  Feeding tube placement. EXAM: ABDOMEN - 1 VIEW COMPARISON:  Radiograph 11/16/2019 FINDINGS: Tip and side port of the enteric tube below the diaphragm in the stomach. Nonobstructive bowel gas pattern. Right femoral catheter tip is in the region of the lower IVC. IMPRESSION: Tip and side port  of the enteric tube below the diaphragm in the stomach. Electronically Signed   By: Keith Rake M.D.   On: 12/02/2019 12:40   DG Abdomen 1 View  Result Date: 11/16/2019 CLINICAL DATA:  NG tube placement. EXAM: PORTABLE CHEST 1 VIEW COMPARISON:  One-view chest x-ray 11/16/2019 FINDINGS: Side port of the NG tube is in the stomach. Bowel gas pattern is unremarkable.  IMPRESSION: Side port of the NG tube is in the stomach. Electronically Signed   By: San Morelle M.D.   On: 11/16/2019 04:54   Portable Chest x-ray  Result Date: 12/02/2019 CLINICAL DATA:  Endotracheal tube placement. COVID positive 8 days ago. EXAM: PORTABLE CHEST 1 VIEW COMPARISON:  Radiograph earlier today. FINDINGS: Endotracheal tube tip 8 cm from the carina at the level of the clavicular heads. Enteric tube in place with tip below the diaphragm. Similar cardiomegaly. Bilateral pulmonary opacities with a slight perihilar predominance, improved aeration of the left lung base, otherwise unchanged. No pneumothorax. Small pleural effusions, improved on the left. No acute osseous abnormalities are seen. IMPRESSION: 1. Endotracheal tube tip 8 cm from the carina at the level of the clavicular heads. 2. Heterogeneous bilateral pulmonary opacities, may represent multifocal pneumonia in the setting of COVID versus pulmonary edema, or combination thereof. There is improved left basilar aeration from exam earlier today. 3. Unchanged cardiomegaly.  Small pleural effusions. Electronically Signed   By: Keith Rake M.D.   On: 12/02/2019 12:43   DG Chest Port 1 View  Result Date: 12/02/2019 CLINICAL DATA:  Shortness of breath EXAM: PORTABLE CHEST 1 VIEW COMPARISON:  November 16, 2019 FINDINGS: There is extensive airspace opacity throughout the lungs bilaterally with slight increase in airspace opacity in the upper lobes and more extensive airspace opacity in the left base compared to most recent study. There is a small left pleural effusion. Heart is upper normal in size with pulmonary vascularity normal. No adenopathy. IMPRESSION: Widespread airspace opacity bilaterally, consistent with multifocal pneumonia with overall progression of infiltrate compared to most recent study. Small left pleural effusion. Stable cardiac prominence. No adenopathy demonstrable radiography. Electronically Signed   By: Lowella Grip III M.D.   On: 12/02/2019 08:42   DG Chest Port 1 View  Result Date: 11/16/2019 CLINICAL DATA:  NG tube placement. EXAM: PORTABLE CHEST 1 VIEW COMPARISON:  One-view chest x-ray 11/16/2019 FINDINGS: Side port of the NG tube is in the stomach. Bowel gas pattern is unremarkable. IMPRESSION: Side port of the NG tube is in the stomach. Electronically Signed   By: San Morelle M.D.   On: 11/16/2019 04:54

## 2019-12-07 ENCOUNTER — Telehealth: Payer: Self-pay

## 2019-12-07 LAB — COMPREHENSIVE METABOLIC PANEL
ALT: 15 U/L (ref 0–44)
AST: 20 U/L (ref 15–41)
Albumin: 2.4 g/dL — ABNORMAL LOW (ref 3.5–5.0)
Alkaline Phosphatase: 36 U/L — ABNORMAL LOW (ref 38–126)
Anion gap: 17 — ABNORMAL HIGH (ref 5–15)
BUN: 54 mg/dL — ABNORMAL HIGH (ref 6–20)
CO2: 23 mmol/L (ref 22–32)
Calcium: 8.2 mg/dL — ABNORMAL LOW (ref 8.9–10.3)
Chloride: 100 mmol/L (ref 98–111)
Creatinine, Ser: 15.59 mg/dL — ABNORMAL HIGH (ref 0.61–1.24)
GFR calc Af Amer: 4 mL/min — ABNORMAL LOW (ref >60)
GFR calc non Af Amer: 3 mL/min — ABNORMAL LOW (ref >60)
Glucose, Bld: 101 mg/dL — ABNORMAL HIGH (ref 70–99)
Potassium: 4 mmol/L (ref 3.5–5.1)
Sodium: 140 mmol/L (ref 135–145)
Total Bilirubin: 0.8 mg/dL (ref 0.3–1.2)
Total Protein: 6.1 g/dL — ABNORMAL LOW (ref 6.5–8.1)

## 2019-12-07 LAB — GLUCOSE, CAPILLARY
Glucose-Capillary: 75 mg/dL (ref 70–99)
Glucose-Capillary: 76 mg/dL (ref 70–99)
Glucose-Capillary: 94 mg/dL (ref 70–99)

## 2019-12-07 LAB — CBC
HCT: 21.9 % — ABNORMAL LOW (ref 39.0–52.0)
Hemoglobin: 6.8 g/dL — CL (ref 13.0–17.0)
MCH: 28.6 pg (ref 26.0–34.0)
MCHC: 31.1 g/dL (ref 30.0–36.0)
MCV: 92 fL (ref 80.0–100.0)
Platelets: 220 10*3/uL (ref 150–400)
RBC: 2.38 MIL/uL — ABNORMAL LOW (ref 4.22–5.81)
RDW: 15.9 % — ABNORMAL HIGH (ref 11.5–15.5)
WBC: 6.4 10*3/uL (ref 4.0–10.5)
nRBC: 0 % (ref 0.0–0.2)

## 2019-12-07 LAB — CULTURE, BLOOD (ROUTINE X 2)
Culture: NO GROWTH
Culture: NO GROWTH
Special Requests: ADEQUATE
Special Requests: ADEQUATE

## 2019-12-07 LAB — PARATHYROID HORMONE, INTACT (NO CA): PTH: 357 pg/mL — ABNORMAL HIGH (ref 15–65)

## 2019-12-07 LAB — PHOSPHORUS: Phosphorus: 6 mg/dL — ABNORMAL HIGH (ref 2.5–4.6)

## 2019-12-07 LAB — PREPARE RBC (CROSSMATCH)

## 2019-12-07 MED ORDER — SODIUM CHLORIDE 0.9% IV SOLUTION: Freq: Once | INTRAVENOUS | Status: AC

## 2019-12-07 MED ORDER — DIPHENHYDRAMINE HCL 50 MG/ML IJ SOLN
12.5000 mg | Freq: Four times a day (QID) | INTRAMUSCULAR | Status: DC | PRN
  Administered 2019-12-07 – 2019-12-09 (×2): 12.5 mg via INTRAVENOUS
  Filled 2019-12-07: qty 1

## 2019-12-07 NOTE — Plan of Care (Signed)
  Problem: Education: Goal: Knowledge of risk factors and measures for prevention of condition will improve Outcome: Progressing   Problem: Coping: Goal: Psychosocial and spiritual needs will be supported Outcome: Progressing   

## 2019-12-07 NOTE — Telephone Encounter (Signed)
Message received from Mickel Baas Marsh/[facility] noting that their office has opened a file for the patient.

## 2019-12-07 NOTE — Progress Notes (Signed)
[facility] Progress Note  Subjective: pt seen in room.  Talked about OP HD and permanent access.    Vitals:   12/07/19 1400 12/07/19 1430 12/07/19 1504 12/07/19 1623  BP: (!) 146/102 (!) 161/108 (!) 158/107 (!) 165/109  Pulse: 98 92 97 (!) 102  Resp:   19 14  Temp:   98.5 F (36.9 C) 98.3 F (36.8 C)  TempSrc:   Oral Axillary  SpO2:   98% 95%  Weight:   99 kg   Height:        Exam: alert, no distress   Mild jvd   Chest cta bilat   Cor reg no mrg   Abd soft ntnd obese   Ext trace LE edema bilat   Neuro - alert and Ox 3    Assessment/ Plan: #Acute hypoxic respiratory failure -Secondary to fluid overload as well as Covid pneumonia - extubated on 4/2 - stable from resp standpoint  #End-stage renal disease - recent hx of refusing OP dialysis , long-term access. This time he is agreeing to [facility] (done) and OP HD. Talked about needing a new perm access and he wants to think about it but agreeing to d/w the surgical team.  Will consult in am.  Will ask SW to start CLIP process also. Is for HD today, TTS sched in hospital.   #COVID-19 pneumonia -Therapies per primary team  #Hypertension - BP's are good, down 12kg, no vol excess now  #Anemia of CKD - PRBC's per primary team   - Got aranesp 60 mcg on 4/2  #Noncompliance - HD onset 2018, had AVF failure/ bleed last fall and ever since has been refusing long-term HD access and OP HD. Now is agreeing more and more to these things after several admits for severe complications of missing HD.  TDC placed this week, as above   # Secondary hyperparathyroidism and metabolic bone disease - hyperphos - HD + binders - intact PTH pending    Patrick Ibarra 12/07/2019, 5:04 PM   Recent Labs  Lab 12/06/19 0257 12/07/19 0241  K 3.8 4.0  BUN 46* 54*  CREATININE 13.51* 15.59*  CALCIUM 7.8* 8.2*  PHOS 5.0* 6.0*  HGB 7.0* 6.8*   Inpatient medications: . amLODipine  10 mg Oral Daily  . carvedilol  12.5  mg Oral BID WC  . chlorhexidine gluconate (MEDLINE KIT)  15 mL Mouth Rinse BID  . Chlorhexidine Gluconate Cloth  6 each Topical Q0600  . Chlorhexidine Gluconate Cloth  6 each Topical Q0600  . Chlorhexidine Gluconate Cloth  6 each Topical Q0600  . Chlorhexidine Gluconate Cloth  6 each Topical Q0600  . darbepoetin (ARANESP) injection - DIALYSIS  60 mcg Intravenous Q Fri-HD  . feeding supplement (NEPRO CARB STEADY)  237 mL Oral Daily  . feeding supplement (PRO-STAT SUGAR FREE 64)  30 mL Oral Q1400  . heparin injection (subcutaneous)  5,000 Units Subcutaneous Q8H  . insulin aspart  0-9 Units Subcutaneous Q4H  . multivitamin  1 tablet Per Tube QHS  . pantoprazole  40 mg Oral QHS   . sodium chloride    . sodium chloride    . sodium chloride Stopped (12/03/19 0430)   sodium chloride, sodium chloride, alteplase, diphenhydrAMINE, heparin, labetalol, midazolam

## 2019-12-07 NOTE — Progress Notes (Signed)
PROGRESS NOTE                                                                                                                                                                                                             Patient Demographics:    Patrick Ibarra, is a 39 y.o. male, DOB - Aug 24, 1981, ZYS:[id number]  Outpatient Primary MD for the patient is Ladell Pier, MD   Admit date - 12/02/2019   LOS - 5  Chief Complaint  Patient presents with  . Shortness of Breath       Brief Narrative: Patient is a 39 y.o. male with PMHx of-used to be on hemodialysis-but elected to stop hemodialysis l and pursue holistic treatment-has had recurrent hospitalizations since January related to missed HD-presented with acute respiratory distress requiring emergent intubation and hemodialysis.  See below for further details  Significant Events: 4/1>> admit to [facility] ICU for severe respiratory distress due to missed HD-required intubation and emergent HD.  3/1-3/20>> admit to [facility] ICU-volume overload, hyperkalemia, bradycardia with sine wave, hemoglobin of 4.8 and acute hypoxic respiratory failure secondary to pulmonary pneumonia.  Refused outpatient HD-refused tunneled HD catheter. Wanted to pursue Holistic approach  1/29-2/2>> Admit for hypoxic respiratory failure, hyperkalemia, metabolic acidosis-requiring emergent HD-left AMA-did not want any further HD-as patient wanted to pursue holistic therapy.  Last HD on 2/1.  Microbiology data: 4/1>> blood culture: No growth so far 3/16>> COVID-19 positive  COVID-19 medications Remdesivir: 3/15>>3/19  DVT prophylaxis: Subcutaneous heparin  Procedures: Right IJ EGD tunneled catheter 4/5 by IR ETT 4/1>> 4/2  Left femoral hemodialysis catheter 4/1  Consults: PCCM, nephrology   Subjective:   No major issues-seems to be slowly coming around and is now agreeable to pursue outpatient  hemodialysis.   Assessment  & Plan :   Acute Hypoxic Resp Failure due to pulmonary edema in the setting of volume overload due to missed HD: Improved-with HD-on room air  COVID-19 pneumonia: Remains on room air-was treated last admission-does not require any treatment at this point.  Fever: afebrile  O2 requirements:  SpO2: 100 % O2 Flow Rate (L/min): 2 L/min FiO2 (%): (!) 2 %   COVID-19 Labs: No results for input(s): DDIMER, FERRITIN, LDH, CRP in the last 72 hours.     Component Value Date/Time   BNP >4,500.0 (H) 12/02/2019 9323  Recent Labs  Lab 12/02/19 1315  PROCALCITON 1.04    Lab Results  Component Value Date   SARSCOV2NAA POSITIVE (A) 11/16/2019   [place] NEGATIVE 10/01/2019   [place] NEGATIVE 09/26/2019   [place] NEGATIVE 09/25/2019     Prone/Incentive Spirometry: encouraged  incentive spirometry use 3-4/hour.  Hyperkalemia: Resolved-after emergent HD.  ESRD-noncompliant with HD: See prior notes-had refused outpatient HD during his prior admissions in favor of a holistic approach.  He has had 2 admissions for emergent medical issues after missing several HD.  This morning-he tells me that he is now thinking of pursuing outpatient HD-we will discuss with nephrology.  Nephrology following and directing care-he had a tunneled hemodialysis catheter placed by IR on 4/5-we will go ahead and remove HD catheter placed in the right groin on admission.  Transaminitis: Likely from congestion in the setting of volume overload.  Follow  Severe anemia: Secondary to ESRD-noncompliant with HD and missing Aranesp injections.  Hemoglobin dropped to 6.8 this morning-subsequently given 1 unit of PRBC-recheck CBC tomorrow.  Hypertension: Controlled-continue with amlodipine, Coreg  DM-2: Change to SSI-sensitive scale and follow CBGs.  Recent Labs    12/06/19 2007 12/07/19 0039 12/07/19 0822  GLUCAP 91 94 76   Obesity: Estimated body mass index is 28.36 kg/m  as calculated from the following:   Height as of this encounter: _0  (1.854 m).   Weight as of this encounter: 97.5 kg.    ABG:    Component Value Date/Time   PHART 7.458 (H) 12/03/2019 0357   PCO2ART 33.1 12/03/2019 0357   PO2ART 80.0 (L) 12/03/2019 0357   HCO3 23.4 12/03/2019 0357   TCO2 24 12/03/2019 0357   ACIDBASEDEF 3.0 (H) 12/02/2019 1244   O2SAT 97.0 12/03/2019 0357    Vent Settings: N/A  Condition -stable but with very poor long-term prognosis-risk for sudden death and disability if he continues to refuse hemodialysis.  Family Communication  : Spoke with spouse over the phone on 4/6  Code Status :  Full Code  Diet :  Diet Order            Diet renal/carb modified with fluid restriction Diet-HS Snack? Nothing; Fluid restriction: 1200 mL Fluid; Room service appropriate? Yes; Fluid consistency: Thin  Diet effective now               Disposition Plan  :  Remain hospitalized  Barriers to discharge: Noncompliance to ESRD-PRBC transfusion today-needs clipping procedure for outpatient HD.  Antimicorbials  :    Anti-infectives (From admission, onward)   Start     Dose/Rate Route Frequency Ordered Stop   12/06/19 0645  ceFAZolin (ANCEF) IVPB 2g/100 mL premix     2 g 200 mL/hr over 30 Minutes Intravenous To Radiology 12/06/19 0636 12/06/19 2011      Inpatient Medications  Scheduled Meds: . amLODipine  10 mg Oral Daily  . carvedilol  12.5 mg Oral BID WC  . chlorhexidine gluconate (MEDLINE KIT)  15 mL Mouth Rinse BID  . Chlorhexidine Gluconate Cloth  6 each Topical Q0600  . Chlorhexidine Gluconate Cloth  6 each Topical Q0600  . Chlorhexidine Gluconate Cloth  6 each Topical Q0600  . Chlorhexidine Gluconate Cloth  6 each Topical Q0600  . darbepoetin (ARANESP) injection - DIALYSIS  60 mcg Intravenous Q Fri-HD  . feeding supplement (NEPRO CARB STEADY)  237 mL Oral Daily  . feeding supplement (PRO-STAT SUGAR FREE 64)  30 mL Oral Q1400  . heparin injection  (subcutaneous)  5,000 Units  Subcutaneous Q8H  . insulin aspart  0-9 Units Subcutaneous Q4H  . multivitamin  1 tablet Per Tube QHS  . pantoprazole  40 mg Oral QHS   Continuous Infusions: . sodium chloride    . sodium chloride    . sodium chloride Stopped (12/03/19 0430)   PRN Meds:.sodium chloride, sodium chloride, alteplase, diphenhydrAMINE, heparin, labetalol, midazolam   Time Spent in minutes  25  See all Orders from today for further details   Oren Binet M.D on 12/07/2019 at 11:27 AM  To page go to [email or web address] - use universal password  [facility] -  Office  [phone]    Objective:   Vitals:   12/07/19 0615 12/07/19 0656 12/07/19 0751 12/07/19 0833  BP: (!) 174/115  (!) 171/118 (!) 160/113  Pulse:   (!) 102 100  Resp:   15 18  Temp: 99 F (37.2 C) 99 F (37.2 C) 97.7 F (36.5 C) 97.9 F (36.6 C)  TempSrc: Axillary Axillary Axillary Oral  SpO2:   93% 100%  Weight:      Height:        Wt Readings from Last 3 Encounters:  12/05/19 97.5 kg  11/20/19 107.6 kg  11/10/19 112.5 kg     Intake/Output Summary (Last 24 hours) at 12/07/2019 1127 Last data filed at 12/07/2019 0639 Gross per 24 hour  Intake 600 ml  Output --  Net 600 ml     Physical Exam Gen Exam:Alert awake-not in any distress HEENT:atraumatic, normocephalic Chest: B/L clear to auscultation anteriorly CVS:S1S2 regular Abdomen:soft non tender, non distended Extremities:no edema Neurology: Non focal Skin: no rash   Data Review:    CBC Recent Labs  Lab 12/03/19 0428 12/04/19 0801 12/05/19 0756 12/06/19 0257 12/07/19 0241  WBC 4.6 7.3 6.5 6.8 6.4  HGB 7.5* 7.5* 7.2* 7.0* 6.8*  HCT 23.9* 24.6* 22.9* 22.6* 21.9*  PLT 188 241 237 223 220  MCV 90.9 92.5 91.6 91.1 92.0  MCH 28.5 28.2 28.8 28.2 28.6  MCHC 31.4 30.5 31.4 31.0 31.1  RDW 15.8* 15.8* 15.5 15.6* 15.9*  LYMPHSABS  --  1.9  --   --   --   MONOABS  --  0.5  --   --   --   EOSABS  --  0.1  --   --   --   BASOSABS   --  0.0  --   --   --     Chemistries  Recent Labs  Lab 12/02/19 0849 12/02/19 0904 12/02/19 1315 12/03/19 0357 12/03/19 0428 12/03/19 0934 12/04/19 0801 12/05/19 0756 12/06/19 0257 12/07/19 0241  NA 139   < > 139   < > 137  --  139 139 140 140  K 6.0*   < > 5.7*   < > 5.8*  --  4.5 4.1 3.8 4.0  CL 97*   < > 96*  --  95*  --  96* 98 100 100  CO2 16*   < > 20*  --  21*  --  _0 GLUCOSE 99   < > 117*  --  99  --  92 90 91 101*  BUN 127*   < > 127*  --  78*  --  50* 36* 46* 54*  CREATININE 29.15*   < > 29.42*  --  19.86*  --  14.22* 10.81* 13.51* 15.59*  CALCIUM 8.8*   < > 9.1  --  8.9  --  8.4* 7.9* 7.8* 8.2*  MG  --   --  3.1*  --   --  2.4  --   --   --   --   AST 18  --  20  --   --   --   --   --   --  20  ALT 16  --  15  --   --   --   --   --   --  15  ALKPHOS 46  --  48  --   --   --   --   --   --  36*  BILITOT 2.3*  --  2.2*  --   --   --   --   --   --  0.8   < > = values in this interval not displayed.   ------------------------------------------------------------------------------------------------------------------ No results for input(s): CHOL, HDL, LDLCALC, TRIG, CHOLHDL, LDLDIRECT in the last 72 hours.  Lab Results  Component Value Date   HGBA1C 5.4 12/03/2019   ------------------------------------------------------------------------------------------------------------------ No results for input(s): TSH, T4TOTAL, T3FREE, THYROIDAB in the last 72 hours.  Invalid input(s): FREET3 ------------------------------------------------------------------------------------------------------------------ No results for input(s): VITAMINB12, FOLATE, FERRITIN, TIBC, IRON, RETICCTPCT in the last 72 hours.  Coagulation profile Recent Labs  Lab 12/02/19 1315  INR 1.3*    No results for input(s): DDIMER in the last 72 hours.  Cardiac Enzymes No results for input(s): CKMB, TROPONINI, MYOGLOBIN in the last 168 hours.  Invalid input(s):  CK ------------------------------------------------------------------------------------------------------------------    Component Value Date/Time   BNP >4,500.0 (H) 12/02/2019 0849    Micro Results Recent Results (from the past 240 hour(s))  Culture, blood (routine x 2)     Status: None   Collection Time: 12/02/19  1:15 PM   Specimen: BLOOD RIGHT HAND  Result Value Ref Range Status   Specimen Description BLOOD RIGHT HAND  Final   Special Requests   Final    BOTTLES DRAWN AEROBIC AND ANAEROBIC Blood Culture adequate volume   Culture   Final    NO GROWTH 5 DAYS Performed at [facility], [address], [place], [place] [address]    Report Status 12/07/2019 FINAL  Final  Culture, blood (routine x 2)     Status: None   Collection Time: 12/02/19  1:20 PM   Specimen: BLOOD RIGHT HAND  Result Value Ref Range Status   Specimen Description BLOOD RIGHT HAND  Final   Special Requests   Final    BOTTLES DRAWN AEROBIC AND ANAEROBIC Blood Culture adequate volume   Culture   Final    NO GROWTH 5 DAYS Performed at [facility], [place] [address] [place], [place] [address]    Report Status 12/07/2019 FINAL  Final    Radiology Reports DG Abd 1 View  Result Date: 12/02/2019 CLINICAL DATA:  Feeding tube placement. EXAM: ABDOMEN - 1 VIEW COMPARISON:  Radiograph 11/16/2019 FINDINGS: Tip and side port of the enteric tube below the diaphragm in the stomach. Nonobstructive bowel gas pattern. Right femoral catheter tip is in the region of the lower IVC. IMPRESSION: Tip and side port of the enteric tube below the diaphragm in the stomach. Electronically Signed   By: Keith Rake M.D.   On: 12/02/2019 12:40   DG Abdomen 1 View  Result Date: 11/16/2019 CLINICAL DATA:  NG tube placement. EXAM: PORTABLE CHEST 1 VIEW COMPARISON:  One-view chest x-ray 11/16/2019 FINDINGS: Side port of the NG tube is in the stomach. Bowel gas pattern is unremarkable.  IMPRESSION: Side port of the NG tube is in  the stomach. Electronically Signed   By: San Morelle M.D.   On: 11/16/2019 04:54   IR Fluoro Guide CV Line Right  Result Date: 12/07/2019 INDICATION: 39 year old male referred for tunneled hemodialysis catheter EXAM: TUNNELED CENTRAL VENOUS HEMODIALYSIS CATHETER PLACEMENT WITH ULTRASOUND AND FLUOROSCOPIC GUIDANCE MEDICATIONS: 2 g Ancef. The antibiotic was given in an appropriate time interval prior to skin puncture. ANESTHESIA/SEDATION: Moderate (conscious) sedation was employed during this procedure. A total of Versed 2.0 mg and Fentanyl 100 mcg was administered intravenously. Moderate Sedation Time: 21 minutes. [name] level of consciousness and vital signs were monitored continuously by radiology nursing throughout the procedure under my direct supervision. FLUOROSCOPY TIME:  Fluoroscopy Time: 0 minutes 48 seconds (3.4 mGy). COMPLICATIONS: None PROCEDURE: Informed written consent was obtained from the patient after a discussion of the risks, benefits, and alternatives to treatment. Questions regarding the procedure were encouraged and answered. The right neck and chest were prepped with chlorhexidine in a sterile fashion, and a sterile drape was applied covering the operative field. Maximum barrier sterile technique with sterile gowns and gloves were used for the procedure. A timeout was performed prior to the initiation of the procedure. After creating a small venotomy incision, a micropuncture kit was utilized to access the right internal jugular vein under direct, real-time ultrasound guidance after the overlying soft tissues were anesthetized with 1% lidocaine with epinephrine. Ultrasound image documentation was performed. The microwire was marked to measure appropriate internal catheter length. External tunneled length was estimated. A total tip to cuff length of 23 cm was selected. Skin and subcutaneous tissues of chest wall below the clavicle were generously infiltrated with 1% lidocaine  for local anesthesia. A small stab incision was made with 11 blade scalpel. The selected hemodialysis catheter was tunneled in a retrograde fashion from the anterior chest wall to the venotomy incision. A guidewire was advanced to the level of the IVC and the micropuncture sheath was exchanged for a peel-away sheath. The catheter was then placed through the peel-away sheath with tips ultimately positioned within the superior aspect of the right atrium. Final catheter positioning was confirmed and documented with a spot radiographic image. The catheter aspirates and flushes normally. The catheter was flushed with appropriate volume heparin dwells. The catheter exit site was secured with a 0-Prolene retention suture. The venotomy incision was closed Derma bond and sterile dressing. Dressings were applied at the chest wall. Patient tolerated the procedure well and remained hemodynamically stable throughout. No complications were encountered and no significant blood loss encountered. IMPRESSION: Status post right IJ tunneled HD catheter. Signed, Dulcy Fanny. Dellia Nims, RPVI [facility] Electronically Signed   By: Corrie Mckusick D.O.   On: 12/07/2019 08:30   IR US Guide Vasc Access Right  Result Date: 12/07/2019 INDICATION: 39 year old male referred for tunneled hemodialysis catheter EXAM: TUNNELED CENTRAL VENOUS HEMODIALYSIS CATHETER PLACEMENT WITH ULTRASOUND AND FLUOROSCOPIC GUIDANCE MEDICATIONS: 2 g Ancef. The antibiotic was given in an appropriate time interval prior to skin puncture. ANESTHESIA/SEDATION: Moderate (conscious) sedation was employed during this procedure. A total of Versed 2.0 mg and Fentanyl 100 mcg was administered intravenously. Moderate Sedation Time: 21 minutes. [name] level of consciousness and vital signs were monitored continuously by radiology nursing throughout the procedure under my direct supervision. FLUOROSCOPY TIME:   Fluoroscopy Time: 0 minutes 48 seconds (3.4 mGy). COMPLICATIONS: None PROCEDURE: Informed written consent was obtained from the patient after a  discussion of the risks, benefits, and alternatives to treatment. Questions regarding the procedure were encouraged and answered. The right neck and chest were prepped with chlorhexidine in a sterile fashion, and a sterile drape was applied covering the operative field. Maximum barrier sterile technique with sterile gowns and gloves were used for the procedure. A timeout was performed prior to the initiation of the procedure. After creating a small venotomy incision, a micropuncture kit was utilized to access the right internal jugular vein under direct, real-time ultrasound guidance after the overlying soft tissues were anesthetized with 1% lidocaine with epinephrine. Ultrasound image documentation was performed. The microwire was marked to measure appropriate internal catheter length. External tunneled length was estimated. A total tip to cuff length of 23 cm was selected. Skin and subcutaneous tissues of chest wall below the clavicle were generously infiltrated with 1% lidocaine for local anesthesia. A small stab incision was made with 11 blade scalpel. The selected hemodialysis catheter was tunneled in a retrograde fashion from the anterior chest wall to the venotomy incision. A guidewire was advanced to the level of the IVC and the micropuncture sheath was exchanged for a peel-away sheath. The catheter was then placed through the peel-away sheath with tips ultimately positioned within the superior aspect of the right atrium. Final catheter positioning was confirmed and documented with a spot radiographic image. The catheter aspirates and flushes normally. The catheter was flushed with appropriate volume heparin dwells. The catheter exit site was secured with a 0-Prolene retention suture. The venotomy incision was closed Derma bond and sterile dressing. Dressings were  applied at the chest wall. Patient tolerated the procedure well and remained hemodynamically stable throughout. No complications were encountered and no significant blood loss encountered. IMPRESSION: Status post right IJ tunneled HD catheter. Signed, Dulcy Fanny. Dellia Nims, RPVI [facility] Electronically Signed   By: Corrie Mckusick D.O.   On: 12/07/2019 08:30   Portable Chest x-ray  Result Date: 12/02/2019 CLINICAL DATA:  Endotracheal tube placement. COVID positive 8 days ago. EXAM: PORTABLE CHEST 1 VIEW COMPARISON:  Radiograph earlier today. FINDINGS: Endotracheal tube tip 8 cm from the carina at the level of the clavicular heads. Enteric tube in place with tip below the diaphragm. Similar cardiomegaly. Bilateral pulmonary opacities with a slight perihilar predominance, improved aeration of the left lung base, otherwise unchanged. No pneumothorax. Small pleural effusions, improved on the left. No acute osseous abnormalities are seen. IMPRESSION: 1. Endotracheal tube tip 8 cm from the carina at the level of the clavicular heads. 2. Heterogeneous bilateral pulmonary opacities, may represent multifocal pneumonia in the setting of COVID versus pulmonary edema, or combination thereof. There is improved left basilar aeration from exam earlier today. 3. Unchanged cardiomegaly.  Small pleural effusions. Electronically Signed   By: Keith Rake M.D.   On: 12/02/2019 12:43   DG Chest Port 1 View  Result Date: 12/02/2019 CLINICAL DATA:  Shortness of breath EXAM: PORTABLE CHEST 1 VIEW COMPARISON:  November 16, 2019 FINDINGS: There is extensive airspace opacity throughout the lungs bilaterally with slight increase in airspace opacity in the upper lobes and more extensive airspace opacity in the left base compared to most recent study. There is a small left pleural effusion. Heart is upper normal in size with pulmonary vascularity normal. No adenopathy. IMPRESSION:  Widespread airspace opacity bilaterally, consistent with multifocal pneumonia with overall progression of infiltrate compared to most recent study. Small left pleural effusion. Stable cardiac prominence. No adenopathy demonstrable radiography. Electronically  Signed   By: Lowella Grip III M.D.   On: 12/02/2019 08:42   DG Chest Port 1 View  Result Date: 11/16/2019 CLINICAL DATA:  NG tube placement. EXAM: PORTABLE CHEST 1 VIEW COMPARISON:  One-view chest x-ray 11/16/2019 FINDINGS: Side port of the NG tube is in the stomach. Bowel gas pattern is unremarkable. IMPRESSION: Side port of the NG tube is in the stomach. Electronically Signed   By: San Morelle M.D.   On: 11/16/2019 04:54

## 2019-12-08 DIAGNOSIS — N186 End stage renal disease: Secondary | ICD-10-CM

## 2019-12-08 DIAGNOSIS — Z992 Dependence on renal dialysis: Secondary | ICD-10-CM

## 2019-12-08 LAB — RENAL FUNCTION PANEL
Albumin: 2.5 g/dL — ABNORMAL LOW (ref 3.5–5.0)
Anion gap: 15 (ref 5–15)
BUN: 35 mg/dL — ABNORMAL HIGH (ref 6–20)
CO2: 25 mmol/L (ref 22–32)
Calcium: 8.4 mg/dL — ABNORMAL LOW (ref 8.9–10.3)
Chloride: 99 mmol/L (ref 98–111)
Creatinine, Ser: 11.72 mg/dL — ABNORMAL HIGH (ref 0.61–1.24)
GFR calc Af Amer: 6 mL/min — ABNORMAL LOW (ref >60)
GFR calc non Af Amer: 5 mL/min — ABNORMAL LOW (ref >60)
Glucose, Bld: 74 mg/dL (ref 70–99)
Phosphorus: 4.9 mg/dL — ABNORMAL HIGH (ref 2.5–4.6)
Potassium: 4.2 mmol/L (ref 3.5–5.1)
Sodium: 139 mmol/L (ref 135–145)

## 2019-12-08 LAB — CBC
HCT: 25.8 % — ABNORMAL LOW (ref 39.0–52.0)
Hemoglobin: 8.1 g/dL — ABNORMAL LOW (ref 13.0–17.0)
MCH: 28.3 pg (ref 26.0–34.0)
MCHC: 31.4 g/dL (ref 30.0–36.0)
MCV: 90.2 fL (ref 80.0–100.0)
Platelets: 240 10*3/uL (ref 150–400)
RBC: 2.86 MIL/uL — ABNORMAL LOW (ref 4.22–5.81)
RDW: 16.6 % — ABNORMAL HIGH (ref 11.5–15.5)
WBC: 6.4 10*3/uL (ref 4.0–10.5)
nRBC: 0 % (ref 0.0–0.2)

## 2019-12-08 LAB — GLUCOSE, CAPILLARY
Glucose-Capillary: 82 mg/dL (ref 70–99)
Glucose-Capillary: 82 mg/dL (ref 70–99)
Glucose-Capillary: 82 mg/dL (ref 70–99)
Glucose-Capillary: 164 mg/dL — ABNORMAL HIGH (ref 70–99)

## 2019-12-08 LAB — TYPE AND SCREEN
ABO/RH(D): O POS
Antibody Screen: NEGATIVE
Unit division: 0

## 2019-12-08 LAB — BPAM RBC
Blood Product Expiration Date: 202104082359
ISSUE DATE / TIME: 202104060611
Unit Type and Rh: 5100

## 2019-12-08 MED ORDER — CHLORHEXIDINE GLUCONATE CLOTH 2 % EX PADS
6.0000 | MEDICATED_PAD | Freq: Every day | CUTANEOUS | Status: DC
  Administered 2019-12-09 – 2019-12-10 (×2): 6 via TOPICAL

## 2019-12-08 NOTE — Consult Note (Signed)
Hospital Consult    Reason for Consult:  Permanent access Referring Physician:  Dr. Jonnie Finner MRN #:  [id number]  History of Present Illness: This is a 39 y.o. male well-known to our service previously underwent ligation of the left arm fistula.  This was done for necrotic pseudoaneurysm.  Wound is now well-healed.  He has been intubated now extubated.  Recently diagnosed with COVID-19.  Patient has now undergone tunnel dialysis catheter placement previously was refusing permanent access.  He is now considering permanent access would like to have this in his left arm as he is right-hand dominant.  Past Medical History:  Diagnosis Date  . Chronic kidney disease    dialysis M-W-F  . Diabetes mellitus without complication (HCC)    type 2, diet controlled  . Hypertension   . LV dysfunction   . Obesity     Past Surgical History:  Procedure Laterality Date  . AV FISTULA PLACEMENT Left 04/10/2017   Procedure: ARTERIOVENOUS (AV) FISTULA CREATION LEFT ARM;  Surgeon: Conrad Oak Ridge, MD;  Location: [facility];  Service: Vascular;  Laterality: Left;  . ESOPHAGOGASTRODUODENOSCOPY (EGD) WITH PROPOFOL N/A 04/04/2017   Procedure: ESOPHAGOGASTRODUODENOSCOPY (EGD) WITH PROPOFOL;  Surgeon: Carol Ada, MD;  Location: [facility];  Service: Endoscopy;  Laterality: N/A;  . FISTULA SUPERFICIALIZATION Left 06/22/2019   Procedure: 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 Primary repair.;  Surgeon: Angelia Mould, MD;  Location: [facility] OR;  Service: Vascular;  Laterality: Left;  . INSERTION OF DIALYSIS CATHETER Right 04/10/2017   Procedure: INSERTION OF DIALYSIS CATHETER - RIGHT INTERNAL JUGULAR PLACEMENT;  Surgeon: Conrad Greencastle, MD;  Location: [place];  Service: Vascular;  Laterality: Right;  . IR FLUORO GUIDE CV LINE RIGHT  12/06/2019  . IR US GUIDE VASC ACCESS RIGHT  12/06/2019  . THROMBECTOMY AND REVISION OF ARTERIOVENTOUS (AV) GORETEX  GRAFT Left 09/26/2019   Procedure: Ligation left arm AV  fistula with excision of left arm AV fistula aneurysm;  Surgeon: Elam Dutch, MD;  Location: [facility] OR;  Service: Vascular;  Laterality: Left;  . TONSILLECTOMY AND ADENOIDECTOMY      No Known Allergies  Prior to Admission medications   Medication Sig Start Date End Date Taking? Authorizing Provider  amLODipine (NORVASC) 10 MG tablet Take 1 tablet (10 mg total) by mouth daily. 06/10/19  Yes Ladell Pier, MD  carvedilol (COREG) 6.25 MG tablet Take 6.25 mg by mouth 2 (two) times daily with a meal.   Yes [provider]  Ferrous Sulfate (FERROUSUL PO) Take 5 mLs by mouth daily after breakfast.   Yes [provider]  lovastatin (MEVACOR) 20 MG tablet Take 1 tablet (20 mg total) by mouth at bedtime. 06/12/19  Yes Ladell Pier, MD  Accu-Chek FastClix Lancets MISC Use as instructed to check blood sugar once daily. E11.9 06/17/19   Ladell Pier, MD  Blood Glucose Monitoring Suppl (ACCU-CHEK GUIDE ME) w/Device KIT 1 kit by Does not apply route daily. Use as instructed to check blood sugar once daily. E11.9 06/17/19   Ladell Pier, MD  carvedilol (COREG) 12.5 MG tablet Take 1 tablet (12.5 mg total) by mouth 2 (two) times daily with a meal. 11/09/19   Ladell Pier, MD  glucose blood (ACCU-CHEK GUIDE) test strip Use as instructed to check blood sugar once daily. E11.9 06/17/19   Ladell Pier, MD  insulin aspart (NOVOLOG FLEXPEN) 100 UNIT/ML FlexPen 2 units subcut with meals for BS greater than 150.  Patient taking differently: Inject 2 Units into the skin See admin instructions. Inject 2 units into the skin three times a day with meals for a BGL greater than 150 11/14/19   Ladell Pier, MD  Insulin Pen Needle (PEN NEEDLES) 31G X 8 MM MISC UAD 11/14/19   Ladell Pier, MD    Social History   Socioeconomic History  . Marital status: Married    Spouse name: Spike Desilets  . Number of children: Not on file  . Years of education: 2  . Highest  education level: High school graduate  Occupational History  . Occupation: Disabled  Tobacco Use  . Smoking status: Never Smoker  . Smokeless tobacco: Never Used  Substance and Sexual Activity  . Alcohol use: Not Currently  . Drug use: No  . Sexual activity: Yes  Other Topics Concern  . Not on file  Social History Narrative   He is married with step kids. He works at [employer].    Social Determinants of Health   Financial Resource Strain: High Risk  . Difficulty of Paying Living Expenses: Very hard  Food Insecurity: No Food Insecurity  . Worried About Charity fundraiser in the Last Year: Never true  . Ran Out of Food in the Last Year: Never true  Transportation Needs: No Transportation Needs  . Lack of Transportation (Medical): No  . Lack of Transportation (Non-Medical): No  Physical Activity: Inactive  . Days of Exercise per Week: 0 days  . Minutes of Exercise per Session: 0 min  Stress: Stress Concern Present  . Feeling of Stress : Very much  Social Connections: Slightly Isolated  . Frequency of Communication with Friends and Family: More than three times a week  . Frequency of Social Gatherings with Friends and Family: More than three times a week  . Attends Religious Services: More than 4 times per year  . Active Member of Clubs or Organizations: No  . Attends Archivist Meetings: Never  . Marital Status: Married  Human resources officer Violence: Not At Risk  . Fear of Current or Ex-Partner: No  . Emotionally Abused: No  . Physically Abused: No  . Sexually Abused: No     Family History  Problem Relation Age of Onset  . Hypertension Mother   . Diabetes Mother   . Hypertension Father     ROS: Cardiovascular: '[]'  chest pain/pressure '[]'  palpitations '[]'  SOB lying flat '[]'  DOE '[]'  pain in legs while walking '[]'  pain in legs at rest '[]'  pain in legs at night '[]'  non-healing ulcers '[]'  hx of DVT '[]'  swelling in legs  Pulmonary: '[]'  productive  cough '[]'  asthma/wheezing '[]'  home O2  Neurologic: '[]'  weakness in '[]'  arms '[]'  legs '[]'  numbness in '[]'  arms '[]'  legs '[]'  hx of CVA '[]'  mini stroke '[]' difficulty speaking or slurred speech '[]'  temporary loss of vision in one eye '[]'  dizziness  Hematologic: '[]'  hx of cancer '[]'  bleeding problems '[]'  problems with blood clotting easily  Endocrine:   '[]'  diabetes '[]'  thyroid disease  GI '[]'  vomiting blood '[]'  blood in stool  GU: '[]'  CKD/renal failure '[]'  HD--'[]'  M/W/F or '[]'  T/T/S '[]'  burning with urination '[]'  blood in urine  Psychiatric: '[]'  anxiety '[]'  depression  Musculoskeletal: '[]'  arthritis '[]'  joint pain  Integumentary: '[]'  rashes '[]'  ulcers  Constitutional: '[]'  fever '[]'  chills   Physical Examination  Vitals:   12/08/19 0514 12/08/19 0805  BP: (!) 162/112 (!) 159/101  Pulse: 99 (!) 104  Resp:    Temp: 98.5 F (36.9 C) 97.9 F (36.6 C)  SpO2: 96% 95%   Body mass index is 28.8 kg/m.  General:  nad HENT: WNL, normocephalic Pulmonary: normal non-labored breathing Cardiac: Palpable brachial artery pulses bilaterally Abdomen:  soft, NT/ND, no masses Extremities: Well-healed left upper extremity wound Neurologic: A&O X 3; Appropriate Affect ; SENSATION: normal; MOTOR FUNCTION:  moving all extremities equally. Speech is fluent/normal  CBC    Component Value Date/Time   WBC 6.4 12/08/2019 0429   RBC 2.86 (L) 12/08/2019 0429   HGB 8.1 (L) 12/08/2019 0429   HGB 9.6 (L) 06/10/2019 1214   HCT 25.8 (L) 12/08/2019 0429   HCT 27.8 (L) 06/10/2019 1214   PLT 240 12/08/2019 0429   PLT 227 06/10/2019 1214   MCV 90.2 12/08/2019 0429   MCV 91 06/10/2019 1214   MCH 28.3 12/08/2019 0429   MCHC 31.4 12/08/2019 0429   RDW 16.6 (H) 12/08/2019 0429   RDW 14.5 06/10/2019 1214   LYMPHSABS 1.9 12/04/2019 0801   MONOABS 0.5 12/04/2019 0801   EOSABS 0.1 12/04/2019 0801   BASOSABS 0.0 12/04/2019 0801    BMET    Component Value Date/Time   NA 139 12/08/2019 0429   NA 134 03/18/2017  0942   K 4.2 12/08/2019 0429   CL 99 12/08/2019 0429   CO2 25 12/08/2019 0429   GLUCOSE 74 12/08/2019 0429   BUN 35 (H) 12/08/2019 0429   BUN 135 (HH) 03/18/2017 0942   CREATININE 11.72 (H) 12/08/2019 0429   CREATININE 3.16 (H) 01/05/2014 1459   CALCIUM 8.4 (L) 12/08/2019 0429   CALCIUM 7.4 (L) 03/01/2017 0752   GFRNONAA 5 (L) 12/08/2019 0429   GFRAA 6 (L) 12/08/2019 0429    COAGS: Lab Results  Component Value Date   INR 1.3 (H) 12/02/2019   INR 1.1 10/01/2019   INR 1.59 04/03/2017     Non-Invasive Vascular Imaging:   I reviewed previous vein mapping.  As possible suitable vein on the right no discernible vein in the left upper extremity for fistula creation   ASSESSMENT/PLAN: This is a 39 y.o. male with failed left arm cephalic vein fistula.  He is now considering permanent access would like to keep this in his left arm which would likely need to be graft unless a basilic vein could be found at the time of operation.  I discussed with patient that this could be done while he is inpatient if he is indecisive we could schedule this as an outpatient.  At this time he does not want to schedule but I told him that if he changes his mind we can make him n.p.o. and get him on the schedule the following day anytime later this week or early next week.  Gaelle Adriance C. Donzetta Matters, MD [facility] Office: [phone] Pager: [phone]

## 2019-12-08 NOTE — Progress Notes (Signed)
PROGRESS NOTE    Patrick Ibarra  YIR:[id number] DOB: Jan 08, 1981 DOA: 12/02/2019 PCP: Ladell Pier, MD    Brief Narrative:  39 year old gentleman with history of ESRD who has been intermittently on hemodialysis in 2018, he had stopped hemodialysis in between pursuing holistic approach, recurrent hospitalizations since January related to missed hemodialysis presented this time with acute respiratory distress requiring emergent intubation and hemodialysis.  4/1-admitted to ICU for severe respiratory distress due to missed hemodialysis, intubation and emergent hemodialysis through femoral access. 3/1-3/20-admitted to ICU with volume overload, hyperkalemia, bradycardia, hemoglobin of 4.8.  Refused outpatient hemodialysis and went home. 1/29-2/2-hypoxic respiratory failure, hyperkalemia, metabolic acidosis, emergent hemodialysis, left AMA.  Diagnosed with COVID-19 pneumonia on 3/16.  Assessment & Plan:   Active Problems:   Respiratory failure [facility])   Counseling regarding advance care planning and goals of care  Acute hypoxic respiratory failure due to pulmonary edema in the setting of volume overload secondary to missed hemodialysis. Received multiple hemodialysis with improvement.  Currently on room air. Permacath placed by IR, 4/5 and receiving dialysis. Now agreeable to continue outpatient dialysis, social worker to find outpatient dialysis chair. Able to go home once he has a dialysis schedule available.  COVID-19 infection: Diagnosed 3/16.  21 days from original diagnosis.  Asymptomatic.  Previously treated with antiviral and steroids.  Discontinue airborne precautions.  Continue standard precautions.  Hyperkalemia: Resolved.  ESRD-noncompliance to hemodialysis: Now agreeable for dialysis.  Severe anemia: Secondary to chronic kidney disease and ESRD.  Receiving Aranesp.  Hemoglobin dropped to 6.8-1 unit of PRBC appropriate response.  Hypertension: Blood pressure well  stabilized on amlodipine and Coreg.  Type 2 diabetes: On sliding scale insulin.   DVT prophylaxis: SCDs Code Status: Full code Family Communication: None.  Patient communicating. Disposition Plan: patient is from home. Anticipated DC to home, Barriers to discharge outpatient dialysis chair   Consultants:   Nephrology  Procedures:   Permacath by IR, 4/5  Antimicrobials:   None   Subjective: Patient seen and examined.  No overnight events.  He thinks he is ready to go home.  He thinks he can go back to his previous dialysis center.  He wants to keep of the schedule.  Objective: Vitals:   12/07/19 2014 12/08/19 0000 12/08/19 0514 12/08/19 0805  BP: (!) 154/108  (!) 162/112 (!) 159/101  Pulse: 96 96 99 (!) 104  Resp: 17     Temp: 97.8 F (36.6 C)  98.5 F (36.9 C) 97.9 F (36.6 C)  TempSrc: Oral  Oral Oral  SpO2: 98% 97% 96% 95%  Weight:      Height:        Intake/Output Summary (Last 24 hours) at 12/08/2019 1427 Last data filed at 12/07/2019 1900 Gross per 24 hour  Intake 480 ml  Output 3000 ml  Net -2520 ml   Filed Weights   12/05/19 2200 12/07/19 1136 12/07/19 1504  Weight: 97.5 kg 102.4 kg 99 kg    Examination:  General exam: Appears calm and comfortable, on room air. Respiratory system: Clear to auscultation. Respiratory effort normal. Cardiovascular system: S1 & S2 heard, RRR.  Gastrointestinal system: Abdomen is nondistended, soft and nontender. Central nervous system: Alert and oriented. No focal neurological deficits. Extremities: Symmetric 5 x 5 power. Skin: No rashes, lesions or ulcers Psychiatry: Judgement and insight appear normal. Mood & affect appropriate.  Right chest permacath, nontender. Nonfunctional left arm AV fistula.  Nontender.   Data Reviewed: I have personally reviewed following labs and imaging studies  CBC: Recent Labs  Lab 12/04/19 0801 12/05/19 0756 12/06/19 0257 12/07/19 0241 12/08/19 0429  WBC 7.3 6.5 6.8 6.4 6.4    NEUTROABS 4.8  --   --   --   --   HGB 7.5* 7.2* 7.0* 6.8* 8.1*  HCT 24.6* 22.9* 22.6* 21.9* 25.8*  MCV 92.5 91.6 91.1 92.0 90.2  PLT 241 237 223 220 735   Basic Metabolic Panel: Recent Labs  Lab 12/02/19 1315 12/03/19 0357 12/03/19 0428 12/03/19 0934 12/04/19 0801 12/05/19 0756 12/06/19 0257 12/07/19 0241 12/08/19 0429  NA 139   < >   < >  --  139 139 140 140 139  K 5.7*   < >   < >  --  4.5 4.1 3.8 4.0 4.2  CL 96*   < >   < >  --  96* 98 100 100 99  CO2 20*   < >   < >  --  _0 GLUCOSE 117*   < >   < >  --  92 90 91 101* 74  BUN 127*   < >   < >  --  50* 36* 46* 54* 35*  CREATININE 29.42*   < >   < >  --  14.22* 10.81* 13.51* 15.59* 11.72*  CALCIUM 9.1   < >   < >  --  8.4* 7.9* 7.8* 8.2* 8.4*  MG 3.1*  --   --  2.4  --   --   --   --   --   PHOS 11.7*  --    < > 9.5* 7.9* 5.3* 5.0* 6.0* 4.9*   < > = values in this interval not displayed.   GFR: Estimated Creatinine Clearance: 10.8 mL/min (A) (by C-G formula based on SCr of 11.72 mg/dL (H)). Liver Function Tests: Recent Labs  Lab 12/02/19 0849 12/02/19 0849 12/02/19 1315 12/02/19 1315 12/04/19 0801 12/05/19 0756 12/06/19 0257 12/07/19 0241 12/08/19 0429  AST 18  --  20  --   --   --   --  20  --   ALT 16  --  15  --   --   --   --  15  --   ALKPHOS 46  --  48  --   --   --   --  36*  --   BILITOT 2.3*  --  2.2*  --   --   --   --  0.8  --   PROT 7.4  --  8.0  --   --   --   --  6.1*  --   ALBUMIN 3.0*   < > 3.1*   < > 2.4* 2.2* 2.3* 2.4* 2.5*   < > = values in this interval not displayed.   Recent Labs  Lab 12/02/19 1315  LIPASE 55*  AMYLASE 129*   No results for input(s): AMMONIA in the last 168 hours. Coagulation Profile: Recent Labs  Lab 12/02/19 1315  INR 1.3*   Cardiac Enzymes: No results for input(s): CKTOTAL, CKMB, CKMBINDEX, TROPONINI in the last 168 hours. BNP (last 3 results) No results for input(s): PROBNP in the last 8760 hours. HbA1C: No results for input(s): HGBA1C in  the last 72 hours. CBG: Recent Labs  Lab 12/07/19 0039 12/07/19 0822 12/07/19 1551 12/08/19 0513 12/08/19 0806  GLUCAP 94 76 75 82 82   Lipid Profile: No results for input(s): CHOL, HDL, LDLCALC, TRIG, CHOLHDL,  LDLDIRECT in the last 72 hours. Thyroid Function Tests: No results for input(s): TSH, T4TOTAL, FREET4, T3FREE, THYROIDAB in the last 72 hours. Anemia Panel: No results for input(s): VITAMINB12, FOLATE, FERRITIN, TIBC, IRON, RETICCTPCT in the last 72 hours. Sepsis Labs: Recent Labs  Lab 12/02/19 0849 12/02/19 1315 12/02/19 1322  PROCALCITON  --  1.04  --   LATICACIDVEN 1.0  --  1.2    Recent Results (from the past 240 hour(s))  Culture, blood (routine x 2)     Status: None   Collection Time: 12/02/19  1:15 PM   Specimen: BLOOD RIGHT HAND  Result Value Ref Range Status   Specimen Description BLOOD RIGHT HAND  Final   Special Requests   Final    BOTTLES DRAWN AEROBIC AND ANAEROBIC Blood Culture adequate volume   Culture   Final    NO GROWTH 5 DAYS Performed at [facility], [place] [address] [place], [place] [address]    Report Status 12/07/2019 FINAL  Final  Culture, blood (routine x 2)     Status: None   Collection Time: 12/02/19  1:20 PM   Specimen: BLOOD RIGHT HAND  Result Value Ref Range Status   Specimen Description BLOOD RIGHT HAND  Final   Special Requests   Final    BOTTLES DRAWN AEROBIC AND ANAEROBIC Blood Culture adequate volume   Culture   Final    NO GROWTH 5 DAYS Performed at [facility], [place] [address], [place], [place] [address]    Report Status 12/07/2019 FINAL  Final         Radiology Studies: IR Fluoro Guide CV Line Right  Result Date: 12/07/2019 INDICATION: 39 year old male referred for tunneled hemodialysis catheter EXAM: TUNNELED CENTRAL VENOUS HEMODIALYSIS CATHETER PLACEMENT WITH ULTRASOUND AND FLUOROSCOPIC GUIDANCE MEDICATIONS: 2 g Ancef. The antibiotic was given in an appropriate time interval prior to skin puncture.  ANESTHESIA/SEDATION: Moderate (conscious) sedation was employed during this procedure. A total of Versed 2.0 mg and Fentanyl 100 mcg was administered intravenously. Moderate Sedation Time: 21 minutes. [name] level of consciousness and vital signs were monitored continuously by radiology nursing throughout the procedure under my direct supervision. FLUOROSCOPY TIME:  Fluoroscopy Time: 0 minutes 48 seconds (3.4 mGy). COMPLICATIONS: None PROCEDURE: Informed written consent was obtained from the patient after a discussion of the risks, benefits, and alternatives to treatment. Questions regarding the procedure were encouraged and answered. The right neck and chest were prepped with chlorhexidine in a sterile fashion, and a sterile drape was applied covering the operative field. Maximum barrier sterile technique with sterile gowns and gloves were used for the procedure. A timeout was performed prior to the initiation of the procedure. After creating a small venotomy incision, a micropuncture kit was utilized to access the right internal jugular vein under direct, real-time ultrasound guidance after the overlying soft tissues were anesthetized with 1% lidocaine with epinephrine. Ultrasound image documentation was performed. The microwire was marked to measure appropriate internal catheter length. External tunneled length was estimated. A total tip to cuff length of 23 cm was selected. Skin and subcutaneous tissues of chest wall below the clavicle were generously infiltrated with 1% lidocaine for local anesthesia. A small stab incision was made with 11 blade scalpel. The selected hemodialysis catheter was tunneled in a retrograde fashion from the anterior chest wall to the venotomy incision. A guidewire was advanced to the level of the IVC and the micropuncture sheath was exchanged for a peel-away sheath. The catheter was  then placed through the peel-away sheath with tips ultimately positioned within the superior  aspect of the right atrium. Final catheter positioning was confirmed and documented with a spot radiographic image. The catheter aspirates and flushes normally. The catheter was flushed with appropriate volume heparin dwells. The catheter exit site was secured with a 0-Prolene retention suture. The venotomy incision was closed Derma bond and sterile dressing. Dressings were applied at the chest wall. Patient tolerated the procedure well and remained hemodynamically stable throughout. No complications were encountered and no significant blood loss encountered. IMPRESSION: Status post right IJ tunneled HD catheter. Signed, Dulcy Fanny. Dellia Nims, RPVI [facility] Electronically Signed   By: Corrie Mckusick D.O.   On: 12/07/2019 08:30   IR US Guide Vasc Access Right  Result Date: 12/07/2019 INDICATION: 39 year old male referred for tunneled hemodialysis catheter EXAM: TUNNELED CENTRAL VENOUS HEMODIALYSIS CATHETER PLACEMENT WITH ULTRASOUND AND FLUOROSCOPIC GUIDANCE MEDICATIONS: 2 g Ancef. The antibiotic was given in an appropriate time interval prior to skin puncture. ANESTHESIA/SEDATION: Moderate (conscious) sedation was employed during this procedure. A total of Versed 2.0 mg and Fentanyl 100 mcg was administered intravenously. Moderate Sedation Time: 21 minutes. [name] level of consciousness and vital signs were monitored continuously by radiology nursing throughout the procedure under my direct supervision. FLUOROSCOPY TIME:  Fluoroscopy Time: 0 minutes 48 seconds (3.4 mGy). COMPLICATIONS: None PROCEDURE: Informed written consent was obtained from the patient after a discussion of the risks, benefits, and alternatives to treatment. Questions regarding the procedure were encouraged and answered. The right neck and chest were prepped with chlorhexidine in a sterile fashion, and a sterile drape was applied covering the operative field. Maximum barrier  sterile technique with sterile gowns and gloves were used for the procedure. A timeout was performed prior to the initiation of the procedure. After creating a small venotomy incision, a micropuncture kit was utilized to access the right internal jugular vein under direct, real-time ultrasound guidance after the overlying soft tissues were anesthetized with 1% lidocaine with epinephrine. Ultrasound image documentation was performed. The microwire was marked to measure appropriate internal catheter length. External tunneled length was estimated. A total tip to cuff length of 23 cm was selected. Skin and subcutaneous tissues of chest wall below the clavicle were generously infiltrated with 1% lidocaine for local anesthesia. A small stab incision was made with 11 blade scalpel. The selected hemodialysis catheter was tunneled in a retrograde fashion from the anterior chest wall to the venotomy incision. A guidewire was advanced to the level of the IVC and the micropuncture sheath was exchanged for a peel-away sheath. The catheter was then placed through the peel-away sheath with tips ultimately positioned within the superior aspect of the right atrium. Final catheter positioning was confirmed and documented with a spot radiographic image. The catheter aspirates and flushes normally. The catheter was flushed with appropriate volume heparin dwells. The catheter exit site was secured with a 0-Prolene retention suture. The venotomy incision was closed Derma bond and sterile dressing. Dressings were applied at the chest wall. Patient tolerated the procedure well and remained hemodynamically stable throughout. No complications were encountered and no significant blood loss encountered. IMPRESSION: Status post right IJ tunneled HD catheter. Signed, Dulcy Fanny. Dellia Nims, RPVI [facility] Electronically Signed   By: Corrie Mckusick D.O.   On: 12/07/2019 08:30         Scheduled Meds: . amLODipine  10 mg Oral Daily  .  carvedilol  12.5 mg Oral BID WC  . chlorhexidine gluconate (MEDLINE KIT)  15 mL Mouth Rinse BID  . Chlorhexidine Gluconate Cloth  6 each Topical Q0600  . Chlorhexidine Gluconate Cloth  6 each Topical Q0600  . Chlorhexidine Gluconate Cloth  6 each Topical Q0600  . Chlorhexidine Gluconate Cloth  6 each Topical Q0600  . darbepoetin (ARANESP) injection - DIALYSIS  60 mcg Intravenous Q Fri-HD  . feeding supplement (NEPRO CARB STEADY)  237 mL Oral Daily  . feeding supplement (PRO-STAT SUGAR FREE 64)  30 mL Oral Q1400  . heparin injection (subcutaneous)  5,000 Units Subcutaneous Q8H  . insulin aspart  0-9 Units Subcutaneous Q4H  . multivitamin  1 tablet Per Tube QHS  . pantoprazole  40 mg Oral QHS   Continuous Infusions: . sodium chloride    . sodium chloride    . sodium chloride Stopped (12/03/19 0430)     LOS: 6 days    Time spent: 25 minutes    Barb Merino, MD [facility] Pager [phone]

## 2019-12-08 NOTE — Progress Notes (Signed)
[facility] Progress Note  Subjective:  Patient not examined today directly given COVID-19 + status, utilizing data taken from chart +/- discussions w/ providers and staff.      Vitals:   12/07/19 2014 12/08/19 0000 12/08/19 0514 12/08/19 0805  BP: (!) 154/108  (!) 162/112 (!) 159/101  Pulse: 96 96 99 (!) 104  Resp: 17     Temp: 97.8 F (36.6 C)  98.5 F (36.9 C) 97.9 F (36.6 C)  TempSrc: Oral  Oral Oral  SpO2: 98% 97% 96% 95%  Weight:      Height:        Exam:  Patient not examined today directly given COVID-19 + status, utilizing data taken from chart +/- discussions w/ providers and staff.     Assessment/ Plan: #Acute hypoxic respiratory failure -Secondary to fluid overload as well as Covid pneumonia - extubated on 4/2 - stable from resp standpoint  #End-stage renal disease - recent hx of refusing OP dialysis , long-term access. Has now agreed to doing long-term HD and has had TDC placed. VVS consulted today and d/w patient about new AVF. See their notes. Will ask SW to start CLIP process also. HD tomorrow. Will not be ready for dc until accepted at OP HD unit.   #COVID-19 pneumonia -Therapies per primary team  #Hypertension - BP's are good, down 12kg, no vol excess now  #Anemia of CKD - PRBC's per primary team   - Got aranesp 60 mcg on 4/2  #Noncompliance - HD onset 2018, had AVF failure/ bleed last fall and ever since has been refusing long-term HD access and OP HD. Now is agreeing more and more to these things after several admits for severe complications of missing HD.  TDC placed this week, as above   # Secondary hyperparathyroidism and metabolic bone disease - hyperphos - HD + binders - intact PTH pending    Rob Ladan Vanderzanden 12/08/2019, 2:44 PM   Recent Labs  Lab 12/07/19 0241 12/08/19 0429  K 4.0 4.2  BUN 54* 35*  CREATININE 15.59* 11.72*  CALCIUM 8.2* 8.4*  PHOS 6.0* 4.9*  HGB 6.8* 8.1*   Inpatient  medications: . amLODipine  10 mg Oral Daily  . carvedilol  12.5 mg Oral BID WC  . chlorhexidine gluconate (MEDLINE KIT)  15 mL Mouth Rinse BID  . Chlorhexidine Gluconate Cloth  6 each Topical Q0600  . Chlorhexidine Gluconate Cloth  6 each Topical Q0600  . Chlorhexidine Gluconate Cloth  6 each Topical Q0600  . Chlorhexidine Gluconate Cloth  6 each Topical Q0600  . darbepoetin (ARANESP) injection - DIALYSIS  60 mcg Intravenous Q Fri-HD  . feeding supplement (NEPRO CARB STEADY)  237 mL Oral Daily  . feeding supplement (PRO-STAT SUGAR FREE 64)  30 mL Oral Q1400  . heparin injection (subcutaneous)  5,000 Units Subcutaneous Q8H  . insulin aspart  0-9 Units Subcutaneous Q4H  . multivitamin  1 tablet Per Tube QHS  . pantoprazole  40 mg Oral QHS   . sodium chloride    . sodium chloride    . sodium chloride Stopped (12/03/19 0430)   sodium chloride, sodium chloride, alteplase, diphenhydrAMINE, heparin, labetalol, midazolam

## 2019-12-08 NOTE — Progress Notes (Signed)
   12/08/19 1000  Clinical Encounter Type  Visited With Health care provider  Visit Type Other (Comment) (AD paperwork)  Referral From Nurse  Consult/Referral To Chaplain   Chaplain responded to request for AD. Chaplain gave paperwork to RN who will deliver paperwork to [place]. Chaplain is unable to notarize AD paperwork until [place] is off of air/contact precautions due to the logistics of having volunteers and a notary present. Chaplain remains available for support as needs arise.   Chaplain Resident, Evelene Croon, M Div [phone] on-call pager

## 2019-12-09 ENCOUNTER — Ambulatory Visit: Payer: Medicare Other | Admitting: *Deleted

## 2019-12-09 LAB — GLUCOSE, CAPILLARY
Glucose-Capillary: 104 mg/dL — ABNORMAL HIGH (ref 70–99)
Glucose-Capillary: 104 mg/dL — ABNORMAL HIGH (ref 70–99)
Glucose-Capillary: 140 mg/dL — ABNORMAL HIGH (ref 70–99)
Glucose-Capillary: 79 mg/dL (ref 70–99)
Glucose-Capillary: 79 mg/dL (ref 70–99)
Glucose-Capillary: 99 mg/dL (ref 70–99)

## 2019-12-09 MED ORDER — HEPARIN SODIUM (PORCINE) 1000 UNIT/ML IJ SOLN
INTRAMUSCULAR | Status: AC
  Filled 2019-12-09: qty 7

## 2019-12-09 MED ORDER — DIPHENHYDRAMINE HCL 50 MG/ML IJ SOLN
INTRAMUSCULAR | Status: AC
  Filled 2019-12-09: qty 1

## 2019-12-09 NOTE — Progress Notes (Signed)
[facility] Progress Note  Subjective:  Seen in room. Pt says he does want to go ahead w/ L arm access option in OP setting.  HD today. No sob or cough.    Vitals:   12/09/19 0930 12/09/19 1000 12/09/19 1030 12/09/19 1045  BP: (!) 167/114 (!) 166/114 (!) 164/115 (!) 169/105  Pulse: (!) 104 100 100 (!) 101  Resp:    18  Temp:    98.4 F (36.9 C)  TempSrc:    Oral  SpO2:      Weight:    99.9 kg  Height:        Exam: alert, no distress Mild jvd Chest cta bilat Cor reg no mrg Abd soft ntnd obese Ext trace LE edema bilat Neuro - alert and Ox 3  TDC in chest  Assessment/ Plan: #Acute hypoxic respiratory failure -Secondary to fluid overload as well as Covid pneumonia - extubated on 4/2 - stable from resp standpoint  #End-stage renal disease - recent hx of refusing OP dialysis , long-term access. Has now agreed to long-term HD and has had TDC placed. VVS consulted and plan it appears is for outpt placement of new perm access perhaps in [facility].  CLIP process underway. HD today. TTS here.   #COVID-19 pneumonia -Therapies per primary team  #Hypertension - BP's are good, down 12kg, no vol excess now  #Anemia of CKD - PRBC's per primary team   - Got aranesp 60 mcg on 4/2  #Noncompliance - HD onset 2018, had AVF failure/ bleed last fall and ever since has been refusing long-term HD access and OP HD. Now is agreeing more and more to these things after several admits for severe complications of missing HD.  See above  # Secondary hyperparathyroidism and metabolic bone disease - hyperphos - HD + binders - intact PTH pending    Rob Schertz 12/09/2019, 3:24 PM   Recent Labs  Lab 12/07/19 0241 12/08/19 0429  K 4.0 4.2  BUN 54* 35*  CREATININE 15.59* 11.72*  CALCIUM 8.2* 8.4*  PHOS 6.0* 4.9*  HGB 6.8* 8.1*   Inpatient medications: . amLODipine  10 mg Oral Daily  . carvedilol  12.5 mg Oral BID WC  . chlorhexidine gluconate  (MEDLINE KIT)  15 mL Mouth Rinse BID  . Chlorhexidine Gluconate Cloth  6 each Topical Q0600  . Chlorhexidine Gluconate Cloth  6 each Topical Q0600  . Chlorhexidine Gluconate Cloth  6 each Topical Q0600  . Chlorhexidine Gluconate Cloth  6 each Topical Q0600  . Chlorhexidine Gluconate Cloth  6 each Topical Q0600  . darbepoetin (ARANESP) injection - DIALYSIS  60 mcg Intravenous Q Fri-HD  . diphenhydrAMINE      . feeding supplement (NEPRO CARB STEADY)  237 mL Oral Daily  . feeding supplement (PRO-STAT SUGAR FREE 64)  30 mL Oral Q1400  . heparin      . heparin injection (subcutaneous)  5,000 Units Subcutaneous Q8H  . insulin aspart  0-9 Units Subcutaneous Q4H  . multivitamin  1 tablet Per Tube QHS  . pantoprazole  40 mg Oral QHS   . sodium chloride    . sodium chloride    . sodium chloride Stopped (12/03/19 0430)   sodium chloride, sodium chloride, alteplase, diphenhydrAMINE, heparin, labetalol, midazolam       

## 2019-12-09 NOTE — Plan of Care (Signed)
  Problem: Education: Goal: Knowledge of risk factors and measures for prevention of condition will improve Outcome: Progressing   Problem: Coping: Goal: Psychosocial and spiritual needs will be supported Outcome: Progressing   Problem: Respiratory: Goal: Will maintain a patent airway Outcome: Progressing Goal: Complications related to the disease process, condition or treatment will be avoided or minimized Outcome: Progressing   Problem: Education: Goal: Knowledge of General Education information will improve Description: Including pain rating scale, medication(s)/side effects and non-pharmacologic comfort measures Outcome: Progressing   Problem: Health Behavior/Discharge Planning: Goal: Ability to manage health-related needs will improve Outcome: Progressing   Problem: Clinical Measurements: Goal: Ability to maintain clinical measurements within normal limits will improve Outcome: Progressing Goal: Will remain free from infection Outcome: Progressing Goal: Diagnostic test results will improve Outcome: Progressing Goal: Respiratory complications will improve Outcome: Progressing Goal: Cardiovascular complication will be avoided Outcome: Progressing   Problem: Activity: Goal: Risk for activity intolerance will decrease Outcome: Progressing   Problem: Nutrition: Goal: Adequate nutrition will be maintained Outcome: Progressing   Problem: Coping: Goal: Level of anxiety will decrease Outcome: Progressing   Problem: Elimination: Goal: Will not experience complications related to bowel motility Outcome: Progressing Goal: Will not experience complications related to urinary retention Outcome: Progressing   Problem: Pain Managment: Goal: General experience of comfort will improve Outcome: Progressing   Problem: Safety: Goal: Ability to remain free from injury will improve Outcome: Progressing   Problem: Skin Integrity: Goal: Risk for impaired skin integrity will  decrease Outcome: Progressing   Problem: Education: Goal: Knowledge of General Education information will improve Description: Including pain rating scale, medication(s)/side effects and non-pharmacologic comfort measures Outcome: Progressing   Problem: Health Behavior/Discharge Planning: Goal: Ability to manage health-related needs will improve Outcome: Progressing   Problem: Clinical Measurements: Goal: Ability to maintain clinical measurements within normal limits will improve Outcome: Progressing Goal: Will remain free from infection Outcome: Progressing Goal: Diagnostic test results will improve Outcome: Progressing Goal: Respiratory complications will improve Outcome: Progressing Goal: Cardiovascular complication will be avoided Outcome: Progressing   Problem: Activity: Goal: Risk for activity intolerance will decrease Outcome: Progressing   Problem: Nutrition: Goal: Adequate nutrition will be maintained Outcome: Progressing   Problem: Coping: Goal: Level of anxiety will decrease Outcome: Progressing   Problem: Elimination: Goal: Will not experience complications related to bowel motility Outcome: Progressing Goal: Will not experience complications related to urinary retention Outcome: Progressing   Problem: Pain Managment: Goal: General experience of comfort will improve Outcome: Progressing   Problem: Safety: Goal: Ability to remain free from injury will improve Outcome: Progressing   Problem: Skin Integrity: Goal: Risk for impaired skin integrity will decrease Outcome: Progressing   Problem: Education: Goal: Knowledge of disease and its progression will improve Outcome: Progressing Goal: Individualized Educational Video(s) Outcome: Progressing   Problem: Fluid Volume: Goal: Compliance with measures to maintain balanced fluid volume will improve Outcome: Progressing   Problem: Health Behavior/Discharge Planning: Goal: Ability to manage  health-related needs will improve Outcome: Progressing   Problem: Nutritional: Goal: Ability to make healthy dietary choices will improve Outcome: Progressing   Problem: Clinical Measurements: Goal: Complications related to the disease process, condition or treatment will be avoided or minimized Outcome: Progressing

## 2019-12-09 NOTE — Progress Notes (Signed)
   12/08/19 2008  Assess: MEWS Score  Temp 98 F (36.7 C)  BP (!) 154/106  Pulse Rate 93  ECG Heart Rate 93  Resp 18  Level of Consciousness Alert  SpO2 99 %  O2 Device Room Air  Patient Activity (if Appropriate) In bed  Assess: MEWS Score  MEWS Temp 0  MEWS Systolic 0  MEWS Pulse 0  MEWS RR 0  MEWS LOC 0  MEWS Score 0  MEWS Score Color Green  Assess: if the MEWS score is Yellow or Red  Were vital signs taken at a resting state? Yes  Focused Assessment Documented focused assessment  Early Detection of Sepsis Score *See Row Information* Low  MEWS guidelines implemented *See Row Information* No, other (Comment) (no acute changes)

## 2019-12-09 NOTE — Progress Notes (Signed)
PROGRESS NOTE    KRISTA SOM  TGG:[id number] DOB: 22-Jan-1981 DOA: 12/02/2019 PCP: Ladell Pier, MD    Brief Narrative:  39 year old gentleman with history of ESRD who has been intermittently on hemodialysis in 2018, he had stopped hemodialysis in between pursuing holistic approach, recurrent hospitalizations since January related to missed hemodialysis presented this time with acute respiratory distress requiring emergent intubation and hemodialysis.  4/1-admitted to ICU for severe respiratory distress due to missed hemodialysis, intubation and emergent hemodialysis through femoral access. 3/1-3/20-admitted to ICU with volume overload, hyperkalemia, bradycardia, hemoglobin of 4.8.  Refused outpatient hemodialysis and went home. 1/29-2/2-hypoxic respiratory failure, hyperkalemia, metabolic acidosis, emergent hemodialysis, left AMA.  Diagnosed with COVID-19 pneumonia on 3/16.  Assessment & Plan:   Active Problems:   Respiratory failure [facility])   Counseling regarding advance care planning and goals of care  Acute hypoxic respiratory failure due to pulmonary edema in the setting of volume overload secondary to missed hemodialysis. Received multiple hemodialysis with improvement.  Currently on room air. Permacath placed by IR, 4/5 and receiving dialysis. Now agreeable to continue outpatient dialysis, social worker to find outpatient dialysis chair. Able to go home once he has a dialysis schedule available. Discussed about getting permanent vascular access, seen by vascular surgeon in the hospital and he is not agreeable to the procedure.  He wants to do it outpatient.  COVID-19 infection: Diagnosed 3/16.  22 days from original diagnosis.  Asymptomatic.  Previously treated with antiviral and steroids.  Discontinue airborne precautions.  Continue standard precautions.  Hyperkalemia: Resolved.  ESRD-noncompliance to hemodialysis: Now agreeable for dialysis.  Severe anemia: Secondary  to chronic kidney disease and ESRD.  Receiving Aranesp.  Hemoglobin dropped to 6.8-1 unit of PRBC appropriate response.  We will recheck hemoglobin tomorrow morning.  Hypertension: Blood pressure well stabilized on amlodipine and Coreg.  Type 2 diabetes: On sliding scale insulin.   DVT prophylaxis: SCDs Code Status: Full code Family Communication: None.  Patient communicating. Disposition Plan: patient is from home. Anticipated DC to home, Barriers to discharge outpatient dialysis chair   Consultants:   Nephrology  Procedures:   Permacath by IR, 4/5  Antimicrobials:   None   Subjective: Patient seen and examined.  No overnight events.  Denies any chest pain shortness of breath.  He was getting hemodialysis. I discussed with him about getting a vascular access when he is waiting in the hospital, he does not want to do it here.  He will follow up outpatient.  "I am here for a long time now, I need to get out of here" Objective: Vitals:   12/09/19 0900 12/09/19 0930 12/09/19 1000 12/09/19 1030  BP: (!) 172/111 (!) 167/114 (!) 166/114 (!) 164/115  Pulse: (!) 104 (!) 104 100 100  Resp:      Temp:      TempSrc:      SpO2:      Weight:      Height:       No intake or output data in the 24 hours ending 12/09/19 1038 Filed Weights   12/07/19 1504 12/09/19 0428 12/09/19 0731  Weight: 99 kg 104.5 kg 102.4 kg    Examination:  General exam: Appears calm and comfortable, on room air.  Getting hemodialysis. Respiratory system: Clear to auscultation. Respiratory effort normal. Cardiovascular system: S1 & S2 heard, RRR.  Gastrointestinal system: Abdomen is nondistended, soft and nontender. Central nervous system: Alert and oriented. No focal neurological deficits. Extremities: Symmetric 5 x 5 power. Skin: No  rashes, lesions or ulcers Psychiatry: Judgement and insight appear normal. Mood & affect appropriate.  Right chest permacath, nontender. Nonfunctional left arm AV  fistula.  Nontender.   Data Reviewed: I have personally reviewed following labs and imaging studies  CBC: Recent Labs  Lab 12/04/19 0801 12/05/19 0756 12/06/19 0257 12/07/19 0241 12/08/19 0429  WBC 7.3 6.5 6.8 6.4 6.4  NEUTROABS 4.8  --   --   --   --   HGB 7.5* 7.2* 7.0* 6.8* 8.1*  HCT 24.6* 22.9* 22.6* 21.9* 25.8*  MCV 92.5 91.6 91.1 92.0 90.2  PLT 241 237 223 220 370   Basic Metabolic Panel: Recent Labs  Lab 12/02/19 1315 12/03/19 0357 12/03/19 0428 12/03/19 0934 12/04/19 0801 12/05/19 0756 12/06/19 0257 12/07/19 0241 12/08/19 0429  NA 139   < >   < >  --  139 139 140 140 139  K 5.7*   < >   < >  --  4.5 4.1 3.8 4.0 4.2  CL 96*   < >   < >  --  96* 98 100 100 99  CO2 20*   < >   < >  --  '27 27 24 23 25  ' GLUCOSE 117*   < >   < >  --  92 90 91 101* 74  BUN 127*   < >   < >  --  50* 36* 46* 54* 35*  CREATININE 29.42*   < >   < >  --  14.22* 10.81* 13.51* 15.59* 11.72*  CALCIUM 9.1   < >   < >  --  8.4* 7.9* 7.8* 8.2* 8.4*  MG 3.1*  --   --  2.4  --   --   --   --   --   PHOS 11.7*  --    < > 9.5* 7.9* 5.3* 5.0* 6.0* 4.9*   < > = values in this interval not displayed.   GFR: Estimated Creatinine Clearance: 11 mL/min (A) (by C-G formula based on SCr of 11.72 mg/dL (H)). Liver Function Tests: Recent Labs  Lab 12/02/19 1315 12/02/19 1315 12/04/19 0801 12/05/19 0756 12/06/19 0257 12/07/19 0241 12/08/19 0429  AST 20  --   --   --   --  20  --   ALT 15  --   --   --   --  15  --   ALKPHOS 48  --   --   --   --  36*  --   BILITOT 2.2*  --   --   --   --  0.8  --   PROT 8.0  --   --   --   --  6.1*  --   ALBUMIN 3.1*   < > 2.4* 2.2* 2.3* 2.4* 2.5*   < > = values in this interval not displayed.   Recent Labs  Lab 12/02/19 1315  LIPASE 55*  AMYLASE 129*   No results for input(s): AMMONIA in the last 168 hours. Coagulation Profile: Recent Labs  Lab 12/02/19 1315  INR 1.3*   Cardiac Enzymes: No results for input(s): CKTOTAL, CKMB, CKMBINDEX, TROPONINI  in the last 168 hours. BNP (last 3 results) No results for input(s): PROBNP in the last 8760 hours. HbA1C: No results for input(s): HGBA1C in the last 72 hours. CBG: Recent Labs  Lab 12/08/19 0806 12/08/19 1742 12/08/19 2019 12/09/19 0015 12/09/19 0424  GLUCAP 82 82 164* 104* 79   Lipid  Profile: No results for input(s): CHOL, HDL, LDLCALC, TRIG, CHOLHDL, LDLDIRECT in the last 72 hours. Thyroid Function Tests: No results for input(s): TSH, T4TOTAL, FREET4, T3FREE, THYROIDAB in the last 72 hours. Anemia Panel: No results for input(s): VITAMINB12, FOLATE, FERRITIN, TIBC, IRON, RETICCTPCT in the last 72 hours. Sepsis Labs: Recent Labs  Lab 12/02/19 1315 12/02/19 1322  PROCALCITON 1.04  --   LATICACIDVEN  --  1.2    Recent Results (from the past 240 hour(s))  Culture, blood (routine x 2)     Status: None   Collection Time: 12/02/19  1:15 PM   Specimen: BLOOD RIGHT HAND  Result Value Ref Range Status   Specimen Description BLOOD RIGHT HAND  Final   Special Requests   Final    BOTTLES DRAWN AEROBIC AND ANAEROBIC Blood Culture adequate volume   Culture   Final    NO GROWTH 5 DAYS Performed at [facility], [place] [address], [place], [place] [address]    Report Status 12/07/2019 FINAL  Final  Culture, blood (routine x 2)     Status: None   Collection Time: 12/02/19  1:20 PM   Specimen: BLOOD RIGHT HAND  Result Value Ref Range Status   Specimen Description BLOOD RIGHT HAND  Final   Special Requests   Final    BOTTLES DRAWN AEROBIC AND ANAEROBIC Blood Culture adequate volume   Culture   Final    NO GROWTH 5 DAYS Performed at [facility], [place] [address], [place], [place] [address]    Report Status 12/07/2019 FINAL  Final         Radiology Studies: No results found.      Scheduled Meds: . diphenhydrAMINE      . amLODipine  10 mg Oral Daily  . carvedilol  12.5 mg Oral BID WC  . chlorhexidine gluconate (MEDLINE KIT)  15 mL Mouth Rinse BID  .  Chlorhexidine Gluconate Cloth  6 each Topical Q0600  . Chlorhexidine Gluconate Cloth  6 each Topical Q0600  . Chlorhexidine Gluconate Cloth  6 each Topical Q0600  . Chlorhexidine Gluconate Cloth  6 each Topical Q0600  . Chlorhexidine Gluconate Cloth  6 each Topical Q0600  . darbepoetin (ARANESP) injection - DIALYSIS  60 mcg Intravenous Q Fri-HD  . feeding supplement (NEPRO CARB STEADY)  237 mL Oral Daily  . feeding supplement (PRO-STAT SUGAR FREE 64)  30 mL Oral Q1400  . heparin      . heparin injection (subcutaneous)  5,000 Units Subcutaneous Q8H  . insulin aspart  0-9 Units Subcutaneous Q4H  . multivitamin  1 tablet Per Tube QHS  . pantoprazole  40 mg Oral QHS   Continuous Infusions: . sodium chloride    . sodium chloride    . sodium chloride Stopped (12/03/19 0430)     LOS: 7 days    Time spent: 25 minutes    Barb Merino, MD [facility] Pager [phone]

## 2019-12-09 NOTE — Progress Notes (Signed)
Patient off the floor heading to HD.

## 2019-12-09 NOTE — Progress Notes (Signed)
Renal Navigator has been working on getting patient re-accepted for HD treatment in an outpatient clinic for the past two days by speaking with various area clinics regarding his complex situation. He previously treated at [facility], however, per PA there yesterday, Dr. Jimmy Footman feels patient would benefit from a new start with a new team at a different clinic this time. Navigator spoke with [facility], which is closest to patient's home, but they have declined, given patient's recent hx of refusal to MD recommendation of ongoing HD with multiple near death situations given his decision to refuse.  Navigator called patient to update on process and state that next clinic to try will be [facility], with hopes of acceptance. Patient states he really prefers to return to [facility]. Navigator suggested that he call there himself to see what they say. He called and stated that he too was told that the MD said he thinks a fresh start would be best somewhere else, but that once he has a working fistula, they may reconsider. Patient states he is willing to return to get his fistula worked on.  Navigator referred patient to [facility] clinic and will follow.  Alphonzo Cruise, [place] Renal Navigator [phone]

## 2019-12-10 ENCOUNTER — Inpatient Hospital Stay (HOSPITAL_COMMUNITY): Payer: Medicare Other | Admitting: Critical Care Medicine

## 2019-12-10 ENCOUNTER — Other Ambulatory Visit: Payer: Self-pay | Admitting: *Deleted

## 2019-12-10 ENCOUNTER — Encounter (HOSPITAL_COMMUNITY): Payer: Self-pay | Admitting: Pulmonary Disease

## 2019-12-10 ENCOUNTER — Encounter (HOSPITAL_COMMUNITY)
Admission: EM | Disposition: A | Discharge status: Home / Self Care | Payer: Self-pay | Source: Home / Self Care | Attending: Internal Medicine

## 2019-12-10 HISTORY — PX: AV FISTULA PLACEMENT: SHX1204

## 2019-12-10 HISTORY — PX: REVISON OF ARTERIOVENOUS FISTULA: SHX6074

## 2019-12-10 LAB — GLUCOSE, CAPILLARY
Glucose-Capillary: 93 mg/dL (ref 70–99)
Glucose-Capillary: 98 mg/dL (ref 70–99)
Glucose-Capillary: 95 mg/dL (ref 70–99)
Glucose-Capillary: 67 mg/dL — ABNORMAL LOW (ref 70–99)
Glucose-Capillary: 90 mg/dL (ref 70–99)
Glucose-Capillary: 80 mg/dL (ref 70–99)
Glucose-Capillary: 77 mg/dL (ref 70–99)

## 2019-12-10 LAB — CBC WITH DIFFERENTIAL/PLATELET
Abs Immature Granulocytes: 0.02 10*3/uL (ref 0.00–0.07)
Basophils Absolute: 0 10*3/uL (ref 0.0–0.1)
Basophils Relative: 0 %
Eosinophils Absolute: 0.2 10*3/uL (ref 0.0–0.5)
Eosinophils Relative: 4 %
HCT: 26.7 % — ABNORMAL LOW (ref 39.0–52.0)
Hemoglobin: 8.3 g/dL — ABNORMAL LOW (ref 13.0–17.0)
Immature Granulocytes: 0 %
Lymphocytes Relative: 25 %
Lymphs Abs: 1.4 10*3/uL (ref 0.7–4.0)
MCH: 28.5 pg (ref 26.0–34.0)
MCHC: 31.1 g/dL (ref 30.0–36.0)
MCV: 91.8 fL (ref 80.0–100.0)
Monocytes Absolute: 1 10*3/uL (ref 0.1–1.0)
Monocytes Relative: 18 %
Neutro Abs: 2.9 10*3/uL (ref 1.7–7.7)
Neutrophils Relative %: 53 %
Platelets: 240 10*3/uL (ref 150–400)
RBC: 2.91 MIL/uL — ABNORMAL LOW (ref 4.22–5.81)
RDW: 15.9 % — ABNORMAL HIGH (ref 11.5–15.5)
WBC: 5.6 10*3/uL (ref 4.0–10.5)
nRBC: 0 % (ref 0.0–0.2)

## 2019-12-10 SURGERY — ARTERIOVENOUS (AV) FISTULA CREATION
Anesthesia: Monitor Anesthesia Care | Site: Arm Upper | Laterality: Left

## 2019-12-10 MED ORDER — PROPOFOL 10 MG/ML IV BOLUS
INTRAVENOUS | Status: DC | PRN
  Administered 2019-12-10: 20 mg via INTRAVENOUS

## 2019-12-10 MED ORDER — DEXTROSE 50 % IV SOLN
INTRAVENOUS | Status: AC
  Filled 2019-12-10: qty 50

## 2019-12-10 MED ORDER — FENTANYL CITRATE (PF) 250 MCG/5ML IJ SOLN
INTRAMUSCULAR | Status: AC
  Filled 2019-12-10: qty 5

## 2019-12-10 MED ORDER — SODIUM CHLORIDE 0.9 % IV SOLN
INTRAVENOUS | Status: DC | PRN
  Administered 2019-12-10: 500 mL

## 2019-12-10 MED ORDER — HYDROMORPHONE HCL 1 MG/ML IJ SOLN
0.2500 mg | INTRAMUSCULAR | Status: DC | PRN
  Administered 2019-12-10 (×2): 0.5 mg via INTRAVENOUS

## 2019-12-10 MED ORDER — CHLORHEXIDINE GLUCONATE CLOTH 2 % EX PADS
6.0000 | MEDICATED_PAD | Freq: Every day | CUTANEOUS | Status: DC
  Administered 2019-12-11: 6 via TOPICAL

## 2019-12-10 MED ORDER — DARBEPOETIN ALFA 60 MCG/0.3ML IJ SOSY
60.0000 ug | PREFILLED_SYRINGE | INTRAMUSCULAR | Status: DC
  Administered 2019-12-10: 60 ug via SUBCUTANEOUS
  Filled 2019-12-10: qty 0.3

## 2019-12-10 MED ORDER — LABETALOL HCL 5 MG/ML IV SOLN
10.0000 mg | INTRAVENOUS | Status: AC | PRN
  Administered 2019-12-10 (×2): 10 mg via INTRAVENOUS

## 2019-12-10 MED ORDER — 0.9 % SODIUM CHLORIDE (POUR BTL) OPTIME
TOPICAL | Status: DC | PRN
  Administered 2019-12-10: 1000 mL

## 2019-12-10 MED ORDER — PROPOFOL 500 MG/50ML IV EMUL
INTRAVENOUS | Status: DC | PRN
  Administered 2019-12-10: 75 ug/kg/min via INTRAVENOUS

## 2019-12-10 MED ORDER — MIDAZOLAM HCL 5 MG/5ML IJ SOLN
INTRAMUSCULAR | Status: DC | PRN
  Administered 2019-12-10: 2 mg via INTRAVENOUS

## 2019-12-10 MED ORDER — PROPOFOL 1000 MG/100ML IV EMUL
INTRAVENOUS | Status: AC
  Filled 2019-12-10: qty 100

## 2019-12-10 MED ORDER — LABETALOL HCL 5 MG/ML IV SOLN
INTRAVENOUS | Status: AC
  Filled 2019-12-10: qty 4

## 2019-12-10 MED ORDER — HYDROMORPHONE HCL 1 MG/ML IJ SOLN
INTRAMUSCULAR | Status: AC
  Filled 2019-12-10: qty 1

## 2019-12-10 MED ORDER — CEFAZOLIN SODIUM-DEXTROSE 2-3 GM-%(50ML) IV SOLR
INTRAVENOUS | Status: DC | PRN
  Administered 2019-12-10: 2 g via INTRAVENOUS

## 2019-12-10 MED ORDER — MIDAZOLAM HCL 2 MG/2ML IJ SOLN
INTRAMUSCULAR | Status: AC
  Filled 2019-12-10: qty 2

## 2019-12-10 MED ORDER — LIDOCAINE-EPINEPHRINE (PF) 1 %-1:200000 IJ SOLN
INTRAMUSCULAR | Status: DC | PRN
  Administered 2019-12-10: 18 mL

## 2019-12-10 MED ORDER — HYDROCODONE-ACETAMINOPHEN 5-325 MG PO TABS
1.0000 | ORAL_TABLET | ORAL | Status: DC | PRN
  Administered 2019-12-11 (×3): 2 via ORAL
  Filled 2019-12-10 (×3): qty 2

## 2019-12-10 MED ORDER — SODIUM CHLORIDE 0.9 % IV SOLN: INTRAVENOUS | Status: DC | PRN

## 2019-12-10 MED ORDER — FENTANYL CITRATE (PF) 100 MCG/2ML IJ SOLN
INTRAMUSCULAR | Status: DC | PRN
  Administered 2019-12-10: 50 ug via INTRAVENOUS

## 2019-12-10 MED ORDER — MEPERIDINE HCL 25 MG/ML IJ SOLN: 6.2500 mg | INTRAMUSCULAR | Status: DC | PRN

## 2019-12-10 MED ORDER — SODIUM CHLORIDE 0.9 % IV SOLN
INTRAVENOUS | Status: AC
  Filled 2019-12-10: qty 1.2

## 2019-12-10 MED ORDER — CEFAZOLIN SODIUM 1 G IJ SOLR
INTRAMUSCULAR | Status: AC
  Filled 2019-12-10: qty 20

## 2019-12-10 MED ORDER — ONDANSETRON HCL 4 MG/2ML IJ SOLN: 4.0000 mg | Freq: Once | INTRAMUSCULAR | Status: DC | PRN

## 2019-12-10 MED ORDER — LIDOCAINE-EPINEPHRINE 1 %-1:100000 IJ SOLN
INTRAMUSCULAR | Status: AC
  Filled 2019-12-10: qty 1

## 2019-12-10 SURGICAL SUPPLY — 28 items
ARMBAND PINK RESTRICT EXTREMIT (MISCELLANEOUS) ×3 IMPLANT
CANISTER SUCT 3000ML PPV (MISCELLANEOUS) ×3 IMPLANT
CLIP VESOCCLUDE MED 6/CT (CLIP) ×3 IMPLANT
CLIP VESOCCLUDE SM WIDE 6/CT (CLIP) ×3 IMPLANT
COVER PROBE W GEL 5X96 (DRAPES) IMPLANT
COVER WAND RF STERILE (DRAPES) ×3 IMPLANT
DERMABOND ADVANCED (GAUZE/BANDAGES/DRESSINGS) ×2
DERMABOND ADVANCED .7 DNX12 (GAUZE/BANDAGES/DRESSINGS) ×1 IMPLANT
ELECT REM PT RETURN 9FT ADLT (ELECTROSURGICAL) ×3
ELECTRODE REM PT RTRN 9FT ADLT (ELECTROSURGICAL) ×1 IMPLANT
GLOVE BIO SURGEON STRL SZ7.5 (GLOVE) ×3 IMPLANT
GOWN STRL REUS W/ TWL LRG LVL3 (GOWN DISPOSABLE) ×2 IMPLANT
GOWN STRL REUS W/ TWL XL LVL3 (GOWN DISPOSABLE) ×1 IMPLANT
GOWN STRL REUS W/TWL LRG LVL3 (GOWN DISPOSABLE) ×6
GOWN STRL REUS W/TWL XL LVL3 (GOWN DISPOSABLE) ×3
INSERT FOGARTY SM (MISCELLANEOUS) IMPLANT
KIT BASIN OR (CUSTOM PROCEDURE TRAY) ×3 IMPLANT
KIT TURNOVER KIT B (KITS) ×3 IMPLANT
NS IRRIG 1000ML POUR BTL (IV SOLUTION) ×3 IMPLANT
PACK CV ACCESS (CUSTOM PROCEDURE TRAY) ×3 IMPLANT
PAD ARMBOARD 7.5X6 YLW CONV (MISCELLANEOUS) ×6 IMPLANT
SUT MNCRL AB 4-0 PS2 18 (SUTURE) ×3 IMPLANT
SUT PROLENE 6 0 BV (SUTURE) ×3 IMPLANT
SUT VIC AB 3-0 SH 27 (SUTURE) ×3
SUT VIC AB 3-0 SH 27X BRD (SUTURE) ×1 IMPLANT
TOWEL GREEN STERILE (TOWEL DISPOSABLE) ×3 IMPLANT
UNDERPAD 30X30 (UNDERPADS AND DIAPERS) ×3 IMPLANT
WATER STERILE IRR 1000ML POUR (IV SOLUTION) ×3 IMPLANT

## 2019-12-10 NOTE — Patient Outreach (Signed)
[facility]) Care Management  12/10/2019  NOEL RODIER October 26, 1980 [id number]   CSW was scheduled to contact patient today to follow-up regarding social work services and resources; however, CSW noted that patient continues to remain hospitalized.  Patient presented to the Emergency Department at [facility] on Thursday, December 02, 2019, due to Shortness of Breath and Respiratory Distress.  Patient has declined palliative care services, agreeing to be consistently compliant with hemodialysis treatments, moving forward.  Patient is working with Terri Piedra, LCSW/Renal Navigator, trying to get re-established with an outpatient hemodialysis treatment clinic.  Patient was previously being treated by the [facility]; however, Dr. Jimmy Footman recommended that patient establish care at a new clinic, benefiting from a new treatment team and a fresh start.  According to Ms. Brigitte Pulse, [facility] has declined treatment for patient, due to history of noncompliance and several near death experiences.  Ms. Brigitte Pulse plans to refer patient to [facility], in hopes that patient will be able to establish care, once patient has a functioning fistula.  CSW will continue to follow patient while hospitalized, then resume community case management services, once patient is discharged back home.  Nat Christen, BSW, MSW, LCSW  Licensed Education officer, [facility]  Mailing [place] [address] [place], [place] [address] Physical Address-[address], [place], [place] [address] Toll Free Main # [phone] Fax # [phone] Cell # [phone]  Office # [phone] [email or web address]

## 2019-12-10 NOTE — Progress Notes (Signed)
Since patient has agreed to perm access prior to discharge from this hospitalization, he has been accepted back to [facility] with a TTS schedule at 7:40am. He needs to arrive to his appointments at 7:20am and may return starting on Tuesday, 12/14/19. Navigator notified Nephrologist/Dr. Jonnie Finner and called patient to inform. He requests schedule to also be texted to his cell phone, which Navigator agreed to. Patient is cleared for discharge from an OP HD standpoint after permanent access is placed by VVS.  Alphonzo Cruise, [place] Renal Navigator [phone]

## 2019-12-10 NOTE — Progress Notes (Signed)
PROGRESS NOTE    Patrick Ibarra  MWU:[id number] DOB: 1981/05/16 DOA: 12/02/2019 PCP: Ladell Pier, MD    Brief Narrative:  39 year old gentleman with history of ESRD who has been intermittently on hemodialysis in 2018, he had stopped hemodialysis in between pursuing holistic approach, recurrent hospitalizations since January related to missed hemodialysis presented this time with acute respiratory distress requiring emergent intubation and hemodialysis.  4/1-admitted to ICU for severe respiratory distress due to missed hemodialysis, intubation and emergent hemodialysis through femoral access. 3/1-3/20-admitted to ICU with volume overload, hyperkalemia, bradycardia, hemoglobin of 4.8.  Refused outpatient hemodialysis and went home. 1/29-2/2-hypoxic respiratory failure, hyperkalemia, metabolic acidosis, emergent hemodialysis, left AMA.  Diagnosed with COVID-19 pneumonia on 3/16.  Negative test on 12/02/2019.  Assessment & Plan:   Active Problems:   Respiratory failure [facility])   Counseling regarding advance care planning and goals of care  Acute hypoxic respiratory failure due to pulmonary edema in the setting of volume overload secondary to missed hemodialysis. Received multiple hemodialysis with improvement.  Currently on room air. Permacath placed by IR, 4/5 and receiving dialysis. Now agreeable to continue outpatient dialysis, social worker to find outpatient dialysis chair. Able to go home once he has a dialysis schedule available. Discussed about getting permanent vascular access, seen by vascular surgeon in the hospital and he is not agreeable to the procedure.  He wants to do it outpatient.  COVID-19 infection: Diagnosed 3/16.  23 days from original diagnosis.  Negative test on 4/1.  Asymptomatic.  Previously treated with antiviral and steroids.  Discontinue airborne precautions.  Continue standard precautions.  Hyperkalemia: Resolved.  ESRD-noncompliance to hemodialysis: Now  agreeable for dialysis.  Severe anemia: Secondary to chronic kidney disease and ESRD.  Receiving Aranesp.  Hemoglobin dropped to 6.8-1 unit of PRBC appropriate response.  Remained stable since then.  Hypertension: Blood pressure well stabilized on amlodipine and Coreg.  Type 2 diabetes: On sliding scale insulin.   DVT prophylaxis: SCDs Code Status: Full code Family Communication: None.  Patient communicating. Disposition Plan: patient is from home. Anticipated DC to home, Barriers to discharge outpatient dialysis chair. He was about to leave AMA, today however he agreed to stay to find outpatient chair.   Consultants:   Nephrology  Procedures:   Permacath by IR, 4/5  Antimicrobials:   None   Subjective: Patient seen and examined.  No overnight events.  He is agreeable that many of the outpatient dialysis centers are not accepting him.  This time, he agreed to stay back until he gets an outpatient spot. Objective: Vitals:   12/09/19 2100 12/09/19 2344 12/10/19 0410 12/10/19 0808  BP:  (!) 161/111  (!) 168/116  Pulse:  98  (!) 101  Resp:  16  16  Temp: 98 F (36.7 C) 98.1 F (36.7 C) 98.1 F (36.7 C) 98.3 F (36.8 C)  TempSrc: Oral Oral Axillary Oral  SpO2:  99%  99%  Weight:      Height:        Intake/Output Summary (Last 24 hours) at 12/10/2019 1358 Last data filed at 12/09/2019 2344 Gross per 24 hour  Intake 340 ml  Output --  Net 340 ml   Filed Weights   12/09/19 0428 12/09/19 0731 12/09/19 1045  Weight: 104.5 kg 102.4 kg 99.9 kg    Examination:  General exam: Appears calm and comfortable, on room air.  Getting hemodialysis. Respiratory system: Clear to auscultation. Respiratory effort normal. Cardiovascular system: S1 & S2 heard, RRR.  Gastrointestinal system: Abdomen  is nondistended, soft and nontender. Central nervous system: Alert and oriented. No focal neurological deficits. Extremities: Symmetric 5 x 5 power. Skin: No rashes, lesions or  ulcers Psychiatry: Judgement and insight appear normal. Mood & affect appropriate.  Right chest permacath, nontender. Nonfunctional left arm AV fistula.  Nontender.   Data Reviewed: I have personally reviewed following labs and imaging studies  CBC: Recent Labs  Lab 12/04/19 0801 12/04/19 0801 12/05/19 0756 12/06/19 0257 12/07/19 0241 12/08/19 0429 12/10/19 0324  WBC 7.3   < > 6.5 6.8 6.4 6.4 5.6  NEUTROABS 4.8  --   --   --   --   --  2.9  HGB 7.5*   < > 7.2* 7.0* 6.8* 8.1* 8.3*  HCT 24.6*   < > 22.9* 22.6* 21.9* 25.8* 26.7*  MCV 92.5   < > 91.6 91.1 92.0 90.2 91.8  PLT 241   < > 237 223 220 240 240   < > = values in this interval not displayed.   Basic Metabolic Panel: Recent Labs  Lab 12/04/19 0801 12/05/19 0756 12/06/19 0257 12/07/19 0241 12/08/19 0429  NA 139 139 140 140 139  K 4.5 4.1 3.8 4.0 4.2  CL 96* 98 100 100 99  CO2 '27 27 24 23 25  ' GLUCOSE 92 90 91 101* 74  BUN 50* 36* 46* 54* 35*  CREATININE 14.22* 10.81* 13.51* 15.59* 11.72*  CALCIUM 8.4* 7.9* 7.8* 8.2* 8.4*  PHOS 7.9* 5.3* 5.0* 6.0* 4.9*   GFR: Estimated Creatinine Clearance: 10.8 mL/min (A) (by C-G formula based on SCr of 11.72 mg/dL (H)). Liver Function Tests: Recent Labs  Lab 12/04/19 0801 12/05/19 0756 12/06/19 0257 12/07/19 0241 12/08/19 0429  AST  --   --   --  20  --   ALT  --   --   --  15  --   ALKPHOS  --   --   --  36*  --   BILITOT  --   --   --  0.8  --   PROT  --   --   --  6.1*  --   ALBUMIN 2.4* 2.2* 2.3* 2.4* 2.5*   No results for input(s): LIPASE, AMYLASE in the last 168 hours. No results for input(s): AMMONIA in the last 168 hours. Coagulation Profile: No results for input(s): INR, PROTIME in the last 168 hours. Cardiac Enzymes: No results for input(s): CKTOTAL, CKMB, CKMBINDEX, TROPONINI in the last 168 hours. BNP (last 3 results) No results for input(s): PROBNP in the last 8760 hours. HbA1C: No results for input(s): HGBA1C in the last 72 hours. CBG: Recent  Labs  Lab 12/09/19 1935 12/09/19 2346 12/10/19 0409 12/10/19 0805 12/10/19 1258  GLUCAP 104* 140* 93 98 95   Lipid Profile: No results for input(s): CHOL, HDL, LDLCALC, TRIG, CHOLHDL, LDLDIRECT in the last 72 hours. Thyroid Function Tests: No results for input(s): TSH, T4TOTAL, FREET4, T3FREE, THYROIDAB in the last 72 hours. Anemia Panel: No results for input(s): VITAMINB12, FOLATE, FERRITIN, TIBC, IRON, RETICCTPCT in the last 72 hours. Sepsis Labs: No results for input(s): PROCALCITON, LATICACIDVEN in the last 168 hours.  Recent Results (from the past 240 hour(s))  Culture, blood (routine x 2)     Status: None   Collection Time: 12/02/19  1:15 PM   Specimen: BLOOD RIGHT HAND  Result Value Ref Range Status   Specimen Description BLOOD RIGHT HAND  Final   Special Requests   Final    BOTTLES DRAWN AEROBIC  AND ANAEROBIC Blood Culture adequate volume   Culture   Final    NO GROWTH 5 DAYS Performed at [facility], [place] [address] [place], [place] [address]    Report Status 12/07/2019 FINAL  Final  Culture, blood (routine x 2)     Status: None   Collection Time: 12/02/19  1:20 PM   Specimen: BLOOD RIGHT HAND  Result Value Ref Range Status   Specimen Description BLOOD RIGHT HAND  Final   Special Requests   Final    BOTTLES DRAWN AEROBIC AND ANAEROBIC Blood Culture adequate volume   Culture   Final    NO GROWTH 5 DAYS Performed at [facility], [place] [address] [place], [place] [address]    Report Status 12/07/2019 FINAL  Final         Radiology Studies: No results found.      Scheduled Meds: . amLODipine  10 mg Oral Daily  . carvedilol  12.5 mg Oral BID WC  . chlorhexidine gluconate (MEDLINE KIT)  15 mL Mouth Rinse BID  . Chlorhexidine Gluconate Cloth  6 each Topical Q0600  . darbepoetin (ARANESP) injection - NON-DIALYSIS  60 mcg Subcutaneous Q Fri-1800  . feeding supplement (NEPRO CARB STEADY)  237 mL Oral Daily  . feeding supplement (PRO-STAT  SUGAR FREE 64)  30 mL Oral Q1400  . heparin injection (subcutaneous)  5,000 Units Subcutaneous Q8H  . insulin aspart  0-9 Units Subcutaneous Q4H  . multivitamin  1 tablet Per Tube QHS  . pantoprazole  40 mg Oral QHS   Continuous Infusions: . sodium chloride    . sodium chloride    . sodium chloride Stopped (12/03/19 0430)     LOS: 8 days    Time spent: 25 minutes    Barb Merino, MD [facility] Pager [phone]

## 2019-12-10 NOTE — Anesthesia Preprocedure Evaluation (Signed)
Anesthesia Evaluation  Patient identified by MRN, date of birth, ID band Patient awake    Reviewed: Allergy & Precautions, NPO status , Patient's Chart, lab work & pertinent test results  Airway Mallampati: I  TM Distance: >3 FB Neck ROM: Full    Dental   Pulmonary    Pulmonary exam normal        Cardiovascular hypertension, Pt. on medications Normal cardiovascular exam     Neuro/Psych Depression    GI/Hepatic   Endo/Other  diabetes, Type 2  Renal/GU ESRF and DialysisRenal disease     Musculoskeletal   Abdominal   Peds  Hematology   Anesthesia Other Findings   Reproductive/Obstetrics                             Anesthesia Physical Anesthesia Plan  ASA: III  Anesthesia Plan: MAC   Post-op Pain Management:    Induction: Intravenous  PONV Risk Score and Plan: 1 and Ondansetron and Treatment may vary due to age or medical condition  Airway Management Planned: Nasal Cannula  Additional Equipment:   Intra-op Plan:   Post-operative Plan:   Informed Consent: I have reviewed the patients History and Physical, chart, labs and discussed the procedure including the risks, benefits and alternatives for the proposed anesthesia with the patient or authorized representative who has indicated his/her understanding and acceptance.       Plan Discussed with: CRNA and Surgeon  Anesthesia Plan Comments:         Anesthesia Quick Evaluation

## 2019-12-10 NOTE — Transfer of Care (Signed)
Immediate Anesthesia Transfer of Care Note  Patient: Patrick Ibarra  Procedure(s) Performed: First stage basilic vein transposition left arm (Left Arm Upper) Revison Of Arteriovenous Fistula (Left Arm Upper)  Patient Location: PACU  Anesthesia Type:MAC  Level of Consciousness: awake, alert  and oriented  Airway & Oxygen Therapy: Patient Spontanous Breathing  Post-op Assessment: Report given to RN and Post -op Vital signs reviewed and stable  Post vital signs: Reviewed and stable  Last Vitals:  Vitals Value Taken Time  BP 150/104 12/10/19 1900  Temp    Pulse 95 12/10/19 1859  Resp 14 12/10/19 1900  SpO2 96 % 12/10/19 1859  Vitals shown include unvalidated device data.  Last Pain:  Vitals:   12/10/19 1631  TempSrc: Oral  PainSc:          Complications: No apparent anesthesia complications

## 2019-12-10 NOTE — Anesthesia Postprocedure Evaluation (Signed)
Anesthesia Post Note  Patient: Patrick Ibarra  Procedure(s) Performed: First stage basilic vein transposition left arm (Left Arm Upper) Revison Of Arteriovenous Fistula (Left Arm Upper)     Patient location during evaluation: PACU Anesthesia Type: MAC Level of consciousness: awake and alert Pain management: pain level controlled Vital Signs Assessment: post-procedure vital signs reviewed and stable Respiratory status: spontaneous breathing, nonlabored ventilation, respiratory function stable and patient connected to nasal cannula oxygen Cardiovascular status: stable and blood pressure returned to baseline Postop Assessment: no apparent nausea or vomiting Anesthetic complications: no    Last Vitals:  Vitals:   12/10/19 1955 12/10/19 2015  BP: (!) 140/100 (!) 142/99  Pulse: 81 80  Resp: 17 16  Temp:  36.4 C  SpO2: 95% 97%    Last Pain:  Vitals:   12/10/19 2015  TempSrc: Oral  PainSc:                  [place]

## 2019-12-10 NOTE — Progress Notes (Signed)
Hypoglycemic Event  CBG: 67  Treatment: D50 25 mL (12.5 gm)  Symptoms: None  Follow-up CBG: Time:1718 CBG Result:90  Possible Reasons for Event: Inadequate meal intake   Luci Bank

## 2019-12-10 NOTE — Progress Notes (Signed)
Patrick Ibarra Progress Note  Subjective:  Seen in room. Agrees to perm access w/ VVS this afternoon, appreciate VVS.    Vitals:   12/09/19 2100 12/09/19 2344 12/10/19 0410 12/10/19 0808  BP:  (!) 161/111  (!) 168/116  Pulse:  98  (!) 101  Resp:  16  16  Temp: 98 F (36.7 C) 98.1 F (36.7 C) 98.1 F (36.7 C) 98.3 F (36.8 C)  TempSrc: Oral Oral Axillary Oral  SpO2:  99%  99%  Weight:      Height:        Exam: alert, no distress Mild jvd Chest cta bilat Cor reg no mrg Abd soft ntnd obese Ext trace LE edema bilat Neuro - alert and Ox 3  TDC in chest  Assessment/ Plan: #Acute hypoxic respiratory failure -Secondary to fluid overload as well as Covid pneumonia - extubated on 4/2 - stable from resp standpoint  #End-stage renal disease - recent hx of refusing OP dialysis , long-term access. Has now agreed to long-term HD and has had TDC placed. Going to OR for AVF/AVG per VVS today. HD tomorrow, then should be ok to DC therafter. Has CLIP to [facility] TTS.   #COVID-19 pneumonia -Therapies per primary team  #Hypertension - BP's are good, down 12kg, no vol excess now  #Anemia of CKD - PRBC's per primary team   - Got aranesp 60 mcg on 4/2  #Noncompliance - HD onset 2018, had AVF failure/ bleed last fall and ever since has been refusing long-term HD access and OP HD. Now is agreeing more and more to these things after several admits for severe complications of missing HD.  See above  # Secondary hyperparathyroidism and metabolic bone disease - hyperphos - HD + binders - intact PTH pending    [name] 12/10/2019, 3:25 PM   Recent Labs  Lab 12/07/19 0241 12/07/19 0241 12/08/19 0429 12/10/19 0324  K 4.0  --  4.2  --   BUN 54*  --  35*  --   CREATININE 15.59*  --  11.72*  --   CALCIUM 8.2*  --  8.4*  --   PHOS 6.0*  --  4.9*  --   HGB 6.8*   < > 8.1* 8.3*   < > = values in this interval not displayed.   Inpatient  medications: . amLODipine  10 mg Oral Daily  . carvedilol  12.5 mg Oral BID WC  . chlorhexidine gluconate (MEDLINE KIT)  15 mL Mouth Rinse BID  . Chlorhexidine Gluconate Cloth  6 each Topical Q0600  . darbepoetin (ARANESP) injection - NON-DIALYSIS  60 mcg Subcutaneous Q Fri-1800  . feeding supplement (NEPRO CARB STEADY)  237 mL Oral Daily  . feeding supplement (PRO-STAT SUGAR FREE 64)  30 mL Oral Q1400  . heparin injection (subcutaneous)  5,000 Units Subcutaneous Q8H  . insulin aspart  0-9 Units Subcutaneous Q4H  . multivitamin  1 tablet Per Tube QHS  . pantoprazole  40 mg Oral QHS   . sodium chloride    . sodium chloride    . sodium chloride Stopped (12/03/19 0430)   sodium chloride, sodium chloride, alteplase, diphenhydrAMINE, heparin, labetalol, midazolam

## 2019-12-10 NOTE — Progress Notes (Signed)
CSW received call from RN that patient would like to discharge today. CSW explained to patient that if he were to leave the hospital before he is clipped to an outpatient dialysis center, the hospital would no longer be able to assist him with getting a chair time and he would have to follow up with [facility] for assistance. CSW encouraged patient to remain in the hospital until his seat has been secured. Patient stated understanding and will consider his options as he wants to be home with his family this weekend. Renal Navigator aware.   Ahmiyah Coil LCSW

## 2019-12-10 NOTE — Op Note (Signed)
    Patient name: Patrick Ibarra MRN: [id number] DOB: 09-Oct-1980 Sex: male  12/10/2019 Pre-operative Diagnosis: End-stage renal disease, thrombosed left upper arm AV fistula Post-operative diagnosis:  Same Surgeon:  Eda Paschal. Donzetta Matters, MD Assistant: Arlee Muslim, PA Procedure Performed: 1.  First stage left upper arm brachial artery to basilic vein AV fistula creation 2.  Revision of thrombosed left upper arm cephalic fistula with excision of pseudoaneurysm  Indications: 39 year old male with history of end-stage renal disease previously dialyzed via left arm cephalic vein fistula.  This was recently ligated after bleeding event.  He now has a tunneled dialysis catheter is indicated for permanent access.  He also has a large pseudoaneurysm that is painful and he is indicated for excision of this.  Findings: Basilic vein in the upper arm was actually quite large measured approximately 5 mm diameter and was somewhat thickened.  The brachial artery was diseased but measured 5 mm external diameter.  At completion there is a strong thrill in the vein and a palpable radial artery pulse at the wrist.  The existing fistula had necrotic pseudoaneurysm this was excised with elliptical incision primarily closed.   Procedure:  The patient was identified in the holding area and taken to the operating room where MAC anesthesia was induced.  He was sterilely prepped and draped in the left upper extremity usual fashion antibiotics were administered and a timeout was called.  We began with ultrasound and I have identified a large basilic vein in the upper arm.  The area overlying that the palpable brachial artery pulse was anesthetized.  We also anesthetized in an elliptical fashion around the existing pseudoaneurysm.  We then made a transverse incision between the palpable brachial artery pulse as well as the basilic vein.  We dissected out the vein and protected the nerve.  This was all done above the antecubitum.  We  marked the vein for orientation.  We dissected through the deep fascia the brachial artery placed Vesseloops around it.  The vein was then clamped distally and proximally and transected and we tied it off distally.  We flushed with heparinized saline spatulated.  The arteries clamped distally proximally opened longitudinally and flushed with heparinized saline.  We then sewed the vein end-to-side with 6-0 Prolene suture.  Upon completion we then flushed through the vein itself.  We had a strong thrill and a palpable radial artery pulse the wrist.  We irrigated the wound obtain hemostasis.  We then performed elliptical incision around the pseudoaneurysm on the anterior portion of the arm.  We dissected down and identified necrotic fistula.  This was all dissected out sharply.  We obtain hemostasis thoroughly irrigated the wound.  Both wounds were closed with Vicryl and Monocryl.  Dermabond placed to the level of the skin.  He was awakened from anesthesia having tolerated procedure without any complication.  All counts were correct at completion.  EBL: 50 cc    Winter Trefz C. Donzetta Matters, MD [facility] [place] Office: [phone] Pager: [phone]

## 2019-12-11 DIAGNOSIS — E8770 Fluid overload, unspecified: Secondary | ICD-10-CM | POA: Diagnosis not present

## 2019-12-11 DIAGNOSIS — Z978 Presence of other specified devices: Secondary | ICD-10-CM

## 2019-12-11 DIAGNOSIS — Z992 Dependence on renal dialysis: Secondary | ICD-10-CM | POA: Diagnosis not present

## 2019-12-11 DIAGNOSIS — E875 Hyperkalemia: Secondary | ICD-10-CM | POA: Diagnosis not present

## 2019-12-11 DIAGNOSIS — N186 End stage renal disease: Secondary | ICD-10-CM | POA: Diagnosis not present

## 2019-12-11 DIAGNOSIS — J9601 Acute respiratory failure with hypoxia: Secondary | ICD-10-CM | POA: Diagnosis not present

## 2019-12-11 LAB — CBC WITH DIFFERENTIAL/PLATELET
Abs Immature Granulocytes: 0.02 10*3/uL (ref 0.00–0.07)
Basophils Absolute: 0 10*3/uL (ref 0.0–0.1)
Basophils Relative: 0 %
Eosinophils Absolute: 0.2 10*3/uL (ref 0.0–0.5)
Eosinophils Relative: 4 %
HCT: 26.1 % — ABNORMAL LOW (ref 39.0–52.0)
Hemoglobin: 8.2 g/dL — ABNORMAL LOW (ref 13.0–17.0)
Immature Granulocytes: 0 %
Lymphocytes Relative: 18 %
Lymphs Abs: 1.1 10*3/uL (ref 0.7–4.0)
MCH: 28.6 pg (ref 26.0–34.0)
MCHC: 31.4 g/dL (ref 30.0–36.0)
MCV: 90.9 fL (ref 80.0–100.0)
Monocytes Absolute: 1 10*3/uL (ref 0.1–1.0)
Monocytes Relative: 16 %
Neutro Abs: 3.7 10*3/uL (ref 1.7–7.7)
Neutrophils Relative %: 62 %
Platelets: 217 10*3/uL (ref 150–400)
RBC: 2.87 MIL/uL — ABNORMAL LOW (ref 4.22–5.81)
RDW: 15.7 % — ABNORMAL HIGH (ref 11.5–15.5)
WBC: 6 10*3/uL (ref 4.0–10.5)
nRBC: 0 % (ref 0.0–0.2)

## 2019-12-11 LAB — GLUCOSE, CAPILLARY
Glucose-Capillary: 136 mg/dL — ABNORMAL HIGH (ref 70–99)
Glucose-Capillary: 65 mg/dL — ABNORMAL LOW (ref 70–99)
Glucose-Capillary: 92 mg/dL (ref 70–99)
Glucose-Capillary: 98 mg/dL (ref 70–99)

## 2019-12-11 MED ORDER — RENA-VITE PO TABS: 1.0000 | ORAL_TABLET | Freq: Every day | ORAL | 0 refills | Status: DC

## 2019-12-11 MED ORDER — HEPARIN SODIUM (PORCINE) 1000 UNIT/ML IJ SOLN
INTRAMUSCULAR | Status: AC
  Administered 2019-12-11: 3800 [IU] via INTRAVENOUS_CENTRAL
  Filled 2019-12-11: qty 4

## 2019-12-11 MED ORDER — PANTOPRAZOLE SODIUM 40 MG PO TBEC: 40.0000 mg | DELAYED_RELEASE_TABLET | Freq: Every day | ORAL | 0 refills | Status: DC

## 2019-12-11 NOTE — Procedures (Signed)
   I was present at this dialysis session, have reviewed the session itself and made  appropriate changes Kelly Splinter MD [place] pager [phone]   12/11/2019, 10:32 AM

## 2019-12-11 NOTE — Progress Notes (Signed)
[facility] Progress Note  Subjective:  Seen on HD, no new /co   Vitals:   12/11/19 0800 12/11/19 0830 12/11/19 0900 12/11/19 0930  BP: (!) 152/99 (!) 140/101 (!) 137/98 (!) 142/98  Pulse: 92 92 91 92  Resp: 15 14 (!) 29   Temp:      TempSrc:      SpO2:      Weight:      Height:        Exam: alert, no distress Mild jvd Chest cta bilat Cor reg no mrg Abd soft ntnd obese Ext trace LE edema bilat Neuro - alert and Ox 3  TDC in chest  Assessment/ Plan: #Acute hypoxic respiratory failure -Secondary to fluid overload as well as Covid pneumonia - extubated on 4/2 - stable from resp standpoint  #End-stage renal disease - recent hx of refusing OP dialysis , long-term access. Now has agreed to do long-term OP HD , accepted at [facility] TTS to start on Tuesday next week.  Has new TDC and new 1st stage brachio-basilic AVF LUA done yest 12/10/19. [place] for [employer] after HD today  # Bleeding TDC - IR to look at this prior to dc today  #COVID-19 pneumonia -Therapies per primary team  #Hypertension - BP's are good, down 12kg, no vol excess now  #Anemia of CKD - PRBC's per primary team   - Got aranesp 60 mcg on 4/2  #Noncompliance - HD onset 2018, had AVF failure/ bleed last fall and ever since had been refusing long-term HD access and OP HD. Now back on the right track.   # Secondary hyperparathyroidism and metabolic bone disease - hyperphos - HD + binders - intact PTH pending    Patrick Ibarra 12/11/2019, 10:29 AM   Recent Labs  Lab 12/07/19 0241 12/07/19 0241 12/08/19 0429 12/08/19 0429 12/10/19 0324 12/11/19 0423  K 4.0  --  4.2  --   --   --   BUN 54*  --  35*  --   --   --   CREATININE 15.59*  --  11.72*  --   --   --   CALCIUM 8.2*  --  8.4*  --   --   --   PHOS 6.0*  --  4.9*  --   --   --   HGB 6.8*   < > 8.1*   < > 8.3* 8.2*   < > = values in this interval not displayed.   Inpatient medications: . amLODipine  10 mg Oral  Daily  . carvedilol  12.5 mg Oral BID WC  . chlorhexidine gluconate (MEDLINE KIT)  15 mL Mouth Rinse BID  . Chlorhexidine Gluconate Cloth  6 each Topical Q0600  . darbepoetin (ARANESP) injection - NON-DIALYSIS  60 mcg Subcutaneous Q Fri-1800  . feeding supplement (NEPRO CARB STEADY)  237 mL Oral Daily  . feeding supplement (PRO-STAT SUGAR FREE 64)  30 mL Oral Q1400  . heparin injection (subcutaneous)  5,000 Units Subcutaneous Q8H  . insulin aspart  0-9 Units Subcutaneous Q4H  . multivitamin  1 tablet Per Tube QHS  . pantoprazole  40 mg Oral QHS   . sodium chloride    . sodium chloride    . sodium chloride Stopped (12/03/19 0430)   sodium chloride, sodium chloride, alteplase, diphenhydrAMINE, heparin, HYDROcodone-acetaminophen, labetalol, midazolam

## 2019-12-11 NOTE — Progress Notes (Signed)
Patient educated on d/c papers. Questions answered. IV taken out. Taken by wheelchair to patient pick up.

## 2019-12-11 NOTE — Progress Notes (Signed)
Patient seen at bedside for oozing around the exit site of perm cath. Quick clot placed on posterior aspect of aspect site however  oozing restarted. 2 purse string sutures and derma bond applied. Site for 5 minutes no additional bleeding. hemostases achieved with no additional bleeding noted.  Contact information given to bedside RN if patient bleeding persists.

## 2019-12-11 NOTE — Progress Notes (Signed)
  Progress Note    12/11/2019 10:02 AM 1 Day Post-Op  Subjective: Doing well currently on dialysis  Vitals:   12/11/19 0900 12/11/19 0930  BP: (!) 137/98 (!) 142/98  Pulse: 91 92  Resp: (!) 29   Temp:    SpO2:      Physical Exam: Awake alert oriented On lab respirations Currently on dialysis via catheter right IJ Left upper arm with palpable thrill and medial arm Well-healing anterior arm incision  CBC    Component Value Date/Time   WBC 6.0 12/11/2019 0423   RBC 2.87 (L) 12/11/2019 0423   HGB 8.2 (L) 12/11/2019 0423   HGB 9.6 (L) 06/10/2019 1214   HCT 26.1 (L) 12/11/2019 0423   HCT 27.8 (L) 06/10/2019 1214   PLT 217 12/11/2019 0423   PLT 227 06/10/2019 1214   MCV 90.9 12/11/2019 0423   MCV 91 06/10/2019 1214   MCH 28.6 12/11/2019 0423   MCHC 31.4 12/11/2019 0423   RDW 15.7 (H) 12/11/2019 0423   RDW 14.5 06/10/2019 1214   LYMPHSABS 1.1 12/11/2019 0423   MONOABS 1.0 12/11/2019 0423   EOSABS 0.2 12/11/2019 0423   BASOSABS 0.0 12/11/2019 0423    BMET    Component Value Date/Time   NA 139 12/08/2019 0429   NA 134 03/18/2017 0942   K 4.2 12/08/2019 0429   CL 99 12/08/2019 0429   CO2 25 12/08/2019 0429   GLUCOSE 74 12/08/2019 0429   BUN 35 (H) 12/08/2019 0429   BUN 135 (HH) 03/18/2017 0942   CREATININE 11.72 (H) 12/08/2019 0429   CREATININE 3.16 (H) 01/05/2014 1459   CALCIUM 8.4 (L) 12/08/2019 0429   CALCIUM 7.4 (L) 03/01/2017 0752   GFRNONAA 5 (L) 12/08/2019 0429   GFRAA 6 (L) 12/08/2019 0429    INR    Component Value Date/Time   INR 1.3 (H) 12/02/2019 1315     Intake/Output Summary (Last 24 hours) at 12/11/2019 1002 Last data filed at 12/10/2019 1859 Gross per 24 hour  Intake 150 ml  Output 10 ml  Net 140 ml     Assessment/plan:  39 y.o. male is s/p placement of left arm AV fistula and revision of existing thrombosed fistula.  Patient is okay for discharge from vascular standpoint we will follow up with duplex in 6 weeks to consider second  stage basilic vein fistula.  Corina Stacy C. Donzetta Matters, MD [facility] [place] Office: [phone] Pager: [phone]  12/11/2019 10:02 AM

## 2019-12-11 NOTE — Discharge Instructions (Signed)
Follow with Primary MD Ladell Pier, MD in 7 days   Get CBC, CMP checked next visit within 1 week by Primary MD   Activity: As tolerated with Full fall precautions use walker/cane & assistance as needed  Disposition Home    Diet: Renal-low carbohydrate diet with 1.5 L/day fluid restriction.  Check your CBGs before meals three times a day and before bedtime.  For Heart failure patients - Check your Weight same time everyday, if you gain over 2 pounds, or you develop in leg swelling, experience more shortness of breath or chest pain, call your Primary MD immediately. Follow Cardiac Low Salt Diet and 1.5 lit/day fluid restriction.  Special Instructions: If you have smoked or chewed Tobacco  in the last 2 yrs please stop smoking, stop any regular Alcohol  and or any Recreational drug use.  On your next visit with your primary care physician please Get Medicines reviewed and adjusted.  Please request your Prim.MD to go over all Hospital Tests and Procedure/Radiological results at the follow up, please get all Hospital records sent to your Prim MD by signing hospital release before you go home.  If you experience worsening of your admission symptoms, develop shortness of breath, life threatening emergency, suicidal or homicidal thoughts you must seek medical attention immediately by calling 911 or calling your MD immediately  if symptoms less severe.  You Must read complete instructions/literature along with all the possible adverse reactions/side effects for all the Medicines you take and that have been prescribed to you. Take any new Medicines after you have completely understood and accpet all the possible adverse reactions/side effects.     [facility]  Discharge Instructions  AV Fistula or Graft Surgery for Dialysis Access  Please refer to the following instructions for your post-procedure care. Your surgeon or physician assistant will discuss any  changes with you.  Activity  You may drive the day following your surgery, if you are comfortable and no longer taking prescription pain medication. Resume full activity as the soreness in your incision resolves.  Bathing/Showering  You may shower after you go home. Keep your incision dry for 48 hours. Do not soak in a bathtub, hot tub, or swim until the incision heals completely. You may not shower if you have a hemodialysis catheter.  Incision Care  Clean your incision with mild soap and water after 48 hours. Pat the area dry with a clean towel. You do not need a bandage unless otherwise instructed. Do not apply any ointments or creams to your incision. You may have skin glue on your incision. Do not peel it off. It will come off on its own in about one week. Your arm may swell a bit after surgery. To reduce swelling use pillows to elevate your arm so it is above your heart. Your doctor will tell you if you need to lightly wrap your arm with an ACE bandage.  Diet  Resume your normal diet. There are not special food restrictions following this procedure. In order to heal from your surgery, it is CRITICAL to get adequate nutrition. Your body requires vitamins, minerals, and protein. Vegetables are the best source of vitamins and minerals. Vegetables also provide the perfect balance of protein. Processed food has little nutritional value, so try to avoid this.  Medications  Resume taking all of your medications. If your incision is causing pain, you may take over-the counter pain relievers such as acetaminophen (Tylenol). If you were prescribed  a stronger pain medication, please be aware these medications can cause nausea and constipation. Prevent nausea by taking the medication with a snack or meal. Avoid constipation by drinking plenty of fluids and eating foods with high amount of fiber, such as fruits, vegetables, and grains. Do not take Tylenol if you are taking prescription pain  medications.     Follow up Your surgeon may want to see you in the office following your access surgery. If so, this will be arranged at the time of your surgery.  Please call us immediately for any of the following conditions:  Increased pain, redness, drainage (pus) from your incision site Fever of 101 degrees or higher Severe or worsening pain at your incision site Hand pain or numbness.  Reduce your risk of vascular disease:  Stop smoking. If you would like help, call [facility] at 1-800-QUIT-NOW 7067430467) or [facility] [place] your cholesterol Maintain a desired weight Control your diabetes Keep your blood pressure down  Dialysis  It will take several weeks to several months for your new dialysis access to be ready for use. Your surgeon will determine when it is OK to use it. Your nephrologist will continue to direct your dialysis. You can continue to use your Permcath until your new access is ready for use.  If you have any questions, please call the office at [phone].

## 2019-12-11 NOTE — Plan of Care (Signed)
Problem: Education: Goal: Knowledge of risk factors and measures for prevention of condition will improve Outcome: Adequate for Discharge   Problem: Coping: Goal: Psychosocial and spiritual needs will be supported Outcome: Adequate for Discharge   Problem: Respiratory: Goal: Will maintain a patent airway Outcome: Adequate for Discharge Goal: Complications related to the disease process, condition or treatment will be avoided or minimized Outcome: Adequate for Discharge   Problem: Education: Goal: Knowledge of General Education information will improve Description: Including pain rating scale, medication(s)/side effects and non-pharmacologic comfort measures Outcome: Adequate for Discharge   Problem: Health Behavior/Discharge Planning: Goal: Ability to manage health-related needs will improve Outcome: Adequate for Discharge   Problem: Clinical Measurements: Goal: Ability to maintain clinical measurements within normal limits will improve Outcome: Adequate for Discharge Goal: Will remain free from infection Outcome: Adequate for Discharge Goal: Diagnostic test results will improve Outcome: Adequate for Discharge Goal: Respiratory complications will improve Outcome: Adequate for Discharge Goal: Cardiovascular complication will be avoided Outcome: Adequate for Discharge   Problem: Activity: Goal: Risk for activity intolerance will decrease Outcome: Adequate for Discharge   Problem: Nutrition: Goal: Adequate nutrition will be maintained Outcome: Adequate for Discharge   Problem: Coping: Goal: Level of anxiety will decrease Outcome: Adequate for Discharge   Problem: Elimination: Goal: Will not experience complications related to bowel motility Outcome: Adequate for Discharge Goal: Will not experience complications related to urinary retention Outcome: Adequate for Discharge   Problem: Pain Managment: Goal: General experience of comfort will improve Outcome: Adequate  for Discharge   Problem: Safety: Goal: Ability to remain free from injury will improve Outcome: Adequate for Discharge   Problem: Skin Integrity: Goal: Risk for impaired skin integrity will decrease Outcome: Adequate for Discharge   Problem: Education: Goal: Knowledge of General Education information will improve Description: Including pain rating scale, medication(s)/side effects and non-pharmacologic comfort measures Outcome: Adequate for Discharge   Problem: Health Behavior/Discharge Planning: Goal: Ability to manage health-related needs will improve Outcome: Adequate for Discharge   Problem: Clinical Measurements: Goal: Ability to maintain clinical measurements within normal limits will improve Outcome: Adequate for Discharge Goal: Will remain free from infection Outcome: Adequate for Discharge Goal: Diagnostic test results will improve Outcome: Adequate for Discharge Goal: Respiratory complications will improve Outcome: Adequate for Discharge Goal: Cardiovascular complication will be avoided Outcome: Adequate for Discharge   Problem: Activity: Goal: Risk for activity intolerance will decrease Outcome: Adequate for Discharge   Problem: Nutrition: Goal: Adequate nutrition will be maintained Outcome: Adequate for Discharge   Problem: Coping: Goal: Level of anxiety will decrease Outcome: Adequate for Discharge   Problem: Elimination: Goal: Will not experience complications related to bowel motility Outcome: Adequate for Discharge Goal: Will not experience complications related to urinary retention Outcome: Adequate for Discharge   Problem: Pain Managment: Goal: General experience of comfort will improve Outcome: Adequate for Discharge   Problem: Safety: Goal: Ability to remain free from injury will improve Outcome: Adequate for Discharge   Problem: Skin Integrity: Goal: Risk for impaired skin integrity will decrease Outcome: Adequate for Discharge    Problem: Education: Goal: Knowledge of disease and its progression will improve Outcome: Adequate for Discharge Goal: Individualized Educational Video(s) Outcome: Adequate for Discharge   Problem: Fluid Volume: Goal: Compliance with measures to maintain balanced fluid volume will improve Outcome: Adequate for Discharge   Problem: Health Behavior/Discharge Planning: Goal: Ability to manage health-related needs will improve Outcome: Adequate for Discharge   Problem: Nutritional: Goal: Ability to make healthy dietary choices will improve  Outcome: Adequate for Discharge   Problem: Clinical Measurements: Goal: Complications related to the disease process, condition or treatment will be avoided or minimized Outcome: Adequate for Discharge

## 2019-12-11 NOTE — Discharge Summary (Signed)
[name]:[id number] DOB: [birth year] DOA: 12/02/2019  PCP: Patrick Pier, MD  Admit date: 12/02/2019  Discharge date: 12/11/2019  Admitted From: Home   Disposition:  Home   Recommendations for Outpatient Follow-up:   Follow up with PCP in 1-2 weeks  PCP Please obtain BMP/CBC, 2 view CXR in 1week,  (see Discharge instructions)   PCP Please follow up on the following pending results:    Home Health: None   Equipment/Devices: None  Consultations: VVS, IR, Renal Discharge Condition: Stable    CODE STATUS: Full    Diet Recommendation: Renal-low carbohydrate diet with 1.5 L/day total fluid restriction    Chief Complaint  Patient presents with  . Shortness of Breath     Brief history of present illness from the day of admission and additional interim summary    39 year old gentleman with history of ESRD who has been intermittently on hemodialysis in 2018, he had stopped hemodialysis in between pursuing holistic approach, recurrent hospitalizations since January related to missed hemodialysis presented this time with acute respiratory distress requiring emergent intubation and hemodialysis.  4/1-admitted to ICU for severe respiratory distress due to missed hemodialysis, intubation and emergent hemodialysis through femoral access. 3/1-3/20-admitted to ICU with volume overload, hyperkalemia, bradycardia, hemoglobin of 4.8.  Refused outpatient hemodialysis and went home. 1/29-2/2-hypoxic respiratory failure, hyperkalemia, metabolic acidosis, emergent hemodialysis, left AMA.  Diagnosed with COVID-19 pneumonia on 3/16.  Negative test on 12/02/2019.                                                                 Hospital Course    Acute hypoxic respiratory failure due to pulmonary edema in the setting of  volume overload secondary to missed hemodialysis.  He had longstanding noncompliance with HD recommendations and developed fluid overload, this problem resolved after patient became compliant with dialysis treatments this admission and received multiple dialysis runs and now symptom-free on room air.  He received a Permacath placed by IR, 4/5 and receiving dialysis.  Also got permanent vascular access placed by vascular surgery fistula placed this admission.  He was dialyzed today on 12/11/2019, case discussed with nephrology, he has outpatient schedule and spot already resolved and will be discharged today after dialysis.  He is symptom-free.    COVID-19 infection:  Diagnosed 3/16.  23 days from original diagnosis.  Negative test on 4/1.  Asymptomatic.     Hyperkalemia: Resolved.  ESRD-noncompliance to hemodialysis: Now agreeable for dialysis.  See #1 above.  Severe anemia: Secondary to chronic kidney disease and ESRD.  Receiving Aranesp.  Hemoglobin dropped to 6.8-1 unit of PRBC appropriate response.  Remained stable since then.  Hypertension: Blood pressure well stabilized on amlodipine and Coreg.  Type 2 diabetes:  Continue home regimen.  Discharge diagnosis     Active Problems:  Respiratory failure ([place])   Counseling regarding advance care planning and goals of care    Discharge instructions    Discharge Instructions    Discharge instructions   Complete by: As directed    Follow with Primary MD Patrick Pier, MD in 7 days   Get CBC, CMP checked next visit within 1 week by Primary MD   Activity: As tolerated with Full fall precautions use walker/cane & assistance as needed  Disposition Home    Diet: Renal-low carbohydrate diet with 1.5 L/day fluid restriction.  Check your CBGs before meals three times a day and before bedtime.  For Heart failure patients - Check your Weight same time everyday, if you gain over 2 pounds, or you develop in leg swelling,  experience more shortness of breath or chest pain, call your Primary MD immediately. Follow Cardiac Low Salt Diet and 1.5 lit/day fluid restriction.  Special Instructions: If you have smoked or chewed Tobacco  in the last 2 yrs please stop smoking, stop any regular Alcohol  and or any Recreational drug use.  On your next visit with your primary care physician please Get Medicines reviewed and adjusted.  Please request your Prim.MD to go over all Hospital Tests and Procedure/Radiological results at the follow up, please get all Hospital records sent to your Prim MD by signing hospital release before you go home.  If you experience worsening of your admission symptoms, develop shortness of breath, life threatening emergency, suicidal or homicidal thoughts you must seek medical attention immediately by calling 911 or calling your MD immediately  if symptoms less severe.  You Must read complete instructions/literature along with all the possible adverse reactions/side effects for all the Medicines you take and that have been prescribed to you. Take any new Medicines after you have completely understood and accpet all the possible adverse reactions/side effects.   Increase activity slowly   Complete by: As directed       Discharge Medications   Allergies as of 12/11/2019   No Known Allergies     Medication List    TAKE these medications   Accu-Chek FastClix Lancets Misc Use as instructed to check blood sugar once daily. E11.9   Accu-Chek Guide Me w/Device Kit 1 kit by Does not apply route daily. Use as instructed to check blood sugar once daily. E11.9   Accu-Chek Guide test strip Generic drug: glucose blood Use as instructed to check blood sugar once daily. E11.9   amLODipine 10 MG tablet Commonly known as: NORVASC Take 1 tablet (10 mg total) by mouth daily.   carvedilol 12.5 MG tablet Commonly known as: COREG Take 1 tablet (12.5 mg total) by mouth 2 (two) times daily with a  meal.   FERROUSUL PO Take 5 mLs by mouth daily after breakfast.   lovastatin 20 MG tablet Commonly known as: MEVACOR Take 1 tablet (20 mg total) by mouth at bedtime.   multivitamin Tabs tablet Place 1 tablet into feeding tube at bedtime.   NovoLOG FlexPen 100 UNIT/ML FlexPen Generic drug: insulin aspart 2 units subcut with meals for BS greater than 150. What changed:   how much to take  how to take this  when to take this  additional instructions   pantoprazole 40 MG tablet Commonly known as: PROTONIX Take 1 tablet (40 mg total) by mouth at bedtime.   Pen Needles 31G X 8 MM Misc UAD       Follow-up Information    Patrick Pier, MD. Schedule  an appointment as soon as possible for a visit in 1 week(s).   Specialty: Internal Medicine Contact information: [place] [address] [phone]           Major procedures and Radiology Reports - PLEASE review detailed and final reports thoroughly  -         DG Abd 1 View  Result Date: 12/02/2019 CLINICAL DATA:  Feeding tube placement. EXAM: ABDOMEN - 1 VIEW COMPARISON:  Radiograph 11/16/2019 FINDINGS: Tip and side port of the enteric tube below the diaphragm in the stomach. Nonobstructive bowel gas pattern. Right femoral catheter tip is in the region of the lower IVC. IMPRESSION: Tip and side port of the enteric tube below the diaphragm in the stomach. Electronically Signed   By: Keith Rake M.D.   On: 12/02/2019 12:40   DG Abdomen 1 View  Result Date: 11/16/2019 CLINICAL DATA:  NG tube placement. EXAM: PORTABLE CHEST 1 VIEW COMPARISON:  One-view chest x-ray 11/16/2019 FINDINGS: Side port of the NG tube is in the stomach. Bowel gas pattern is unremarkable. IMPRESSION: Side port of the NG tube is in the stomach. Electronically Signed   By: San Morelle M.D.   On: 11/16/2019 04:54   IR Fluoro Guide CV Line Right  Result Date: 12/07/2019 INDICATION: 39 year old male referred for  tunneled hemodialysis catheter EXAM: TUNNELED CENTRAL VENOUS HEMODIALYSIS CATHETER PLACEMENT WITH ULTRASOUND AND FLUOROSCOPIC GUIDANCE MEDICATIONS: 2 g Ancef. The antibiotic was given in an appropriate time interval prior to skin puncture. ANESTHESIA/SEDATION: Moderate (conscious) sedation was employed during this procedure. A total of Versed 2.0 mg and Fentanyl 100 mcg was administered intravenously. Moderate Sedation Time: 21 minutes. [name] level of consciousness and vital signs were monitored continuously by radiology nursing throughout the procedure under my direct supervision. FLUOROSCOPY TIME:  Fluoroscopy Time: 0 minutes 48 seconds (3.4 mGy). COMPLICATIONS: None PROCEDURE: Informed written consent was obtained from the patient after a discussion of the risks, benefits, and alternatives to treatment. Questions regarding the procedure were encouraged and answered. The right neck and chest were prepped with chlorhexidine in a sterile fashion, and a sterile drape was applied covering the operative field. Maximum barrier sterile technique with sterile gowns and gloves were used for the procedure. A timeout was performed prior to the initiation of the procedure. After creating a small venotomy incision, a micropuncture kit was utilized to access the right internal jugular vein under direct, real-time ultrasound guidance after the overlying soft tissues were anesthetized with 1% lidocaine with epinephrine. Ultrasound image documentation was performed. The microwire was marked to measure appropriate internal catheter length. External tunneled length was estimated. A total tip to cuff length of 23 cm was selected. Skin and subcutaneous tissues of chest wall below the clavicle were generously infiltrated with 1% lidocaine for local anesthesia. A small stab incision was made with 11 blade scalpel. The selected hemodialysis catheter was tunneled in a retrograde fashion from the anterior chest wall to the venotomy  incision. A guidewire was advanced to the level of the IVC and the micropuncture sheath was exchanged for a peel-away sheath. The catheter was then placed through the peel-away sheath with tips ultimately positioned within the superior aspect of the right atrium. Final catheter positioning was confirmed and documented with a spot radiographic image. The catheter aspirates and flushes normally. The catheter was flushed with appropriate volume heparin dwells. The catheter exit site was secured with a 0-Prolene retention suture. The venotomy incision was closed Derma bond and  sterile dressing. Dressings were applied at the chest wall. Patient tolerated the procedure well and remained hemodynamically stable throughout. No complications were encountered and no significant blood loss encountered. IMPRESSION: Status post right IJ tunneled HD catheter. Signed, Dulcy Fanny. Dellia Nims, RPVI [facility] Radiology Electronically Signed   By: Corrie Mckusick D.O.   On: 12/07/2019 08:30   IR US Guide Vasc Access Right  Result Date: 12/07/2019 INDICATION: 39 year old male referred for tunneled hemodialysis catheter EXAM: TUNNELED CENTRAL VENOUS HEMODIALYSIS CATHETER PLACEMENT WITH ULTRASOUND AND FLUOROSCOPIC GUIDANCE MEDICATIONS: 2 g Ancef. The antibiotic was given in an appropriate time interval prior to skin puncture. ANESTHESIA/SEDATION: Moderate (conscious) sedation was employed during this procedure. A total of Versed 2.0 mg and Fentanyl 100 mcg was administered intravenously. Moderate Sedation Time: 21 minutes. [name] level of consciousness and vital signs were monitored continuously by radiology nursing throughout the procedure under my direct supervision. FLUOROSCOPY TIME:  Fluoroscopy Time: 0 minutes 48 seconds (3.4 mGy). COMPLICATIONS: None PROCEDURE: Informed written consent was obtained from the patient after a discussion of the risks, benefits, and alternatives to  treatment. Questions regarding the procedure were encouraged and answered. The right neck and chest were prepped with chlorhexidine in a sterile fashion, and a sterile drape was applied covering the operative field. Maximum barrier sterile technique with sterile gowns and gloves were used for the procedure. A timeout was performed prior to the initiation of the procedure. After creating a small venotomy incision, a micropuncture kit was utilized to access the right internal jugular vein under direct, real-time ultrasound guidance after the overlying soft tissues were anesthetized with 1% lidocaine with epinephrine. Ultrasound image documentation was performed. The microwire was marked to measure appropriate internal catheter length. External tunneled length was estimated. A total tip to cuff length of 23 cm was selected. Skin and subcutaneous tissues of chest wall below the clavicle were generously infiltrated with 1% lidocaine for local anesthesia. A small stab incision was made with 11 blade scalpel. The selected hemodialysis catheter was tunneled in a retrograde fashion from the anterior chest wall to the venotomy incision. A guidewire was advanced to the level of the IVC and the micropuncture sheath was exchanged for a peel-away sheath. The catheter was then placed through the peel-away sheath with tips ultimately positioned within the superior aspect of the right atrium. Final catheter positioning was confirmed and documented with a spot radiographic image. The catheter aspirates and flushes normally. The catheter was flushed with appropriate volume heparin dwells. The catheter exit site was secured with a 0-Prolene retention suture. The venotomy incision was closed Derma bond and sterile dressing. Dressings were applied at the chest wall. Patient tolerated the procedure well and remained hemodynamically stable throughout. No complications were encountered and no significant blood loss encountered. IMPRESSION:  Status post right IJ tunneled HD catheter. Signed, Dulcy Fanny. Dellia Nims, RPVI [facility] Radiology Electronically Signed   By: Corrie Mckusick D.O.   On: 12/07/2019 08:30   Portable Chest x-ray  Result Date: 12/02/2019 CLINICAL DATA:  Endotracheal tube placement. COVID positive 8 days ago. EXAM: PORTABLE CHEST 1 VIEW COMPARISON:  Radiograph earlier today. FINDINGS: Endotracheal tube tip 8 cm from the carina at the level of the clavicular heads. Enteric tube in place with tip below the diaphragm. Similar cardiomegaly. Bilateral pulmonary opacities with a slight perihilar predominance, improved aeration of the left lung base, otherwise unchanged. No pneumothorax. Small pleural effusions, improved on the left.  No acute osseous abnormalities are seen. IMPRESSION: 1. Endotracheal tube tip 8 cm from the carina at the level of the clavicular heads. 2. Heterogeneous bilateral pulmonary opacities, may represent multifocal pneumonia in the setting of COVID versus pulmonary edema, or combination thereof. There is improved left basilar aeration from exam earlier today. 3. Unchanged cardiomegaly.  Small pleural effusions. Electronically Signed   By: Keith Rake M.D.   On: 12/02/2019 12:43   DG Chest Port 1 View  Result Date: 12/02/2019 CLINICAL DATA:  Shortness of breath EXAM: PORTABLE CHEST 1 VIEW COMPARISON:  November 16, 2019 FINDINGS: There is extensive airspace opacity throughout the lungs bilaterally with slight increase in airspace opacity in the upper lobes and more extensive airspace opacity in the left base compared to most recent study. There is a small left pleural effusion. Heart is upper normal in size with pulmonary vascularity normal. No adenopathy. IMPRESSION: Widespread airspace opacity bilaterally, consistent with multifocal pneumonia with overall progression of infiltrate compared to most recent study. Small left pleural effusion. Stable cardiac  prominence. No adenopathy demonstrable radiography. Electronically Signed   By: Lowella Grip III M.D.   On: 12/02/2019 08:42   DG Chest Port 1 View  Result Date: 11/16/2019 CLINICAL DATA:  NG tube placement. EXAM: PORTABLE CHEST 1 VIEW COMPARISON:  One-view chest x-ray 11/16/2019 FINDINGS: Side port of the NG tube is in the stomach. Bowel gas pattern is unremarkable. IMPRESSION: Side port of the NG tube is in the stomach. Electronically Signed   By: San Morelle M.D.   On: 11/16/2019 04:54    Micro Results     Recent Results (from the past 240 hour(s))  Culture, blood (routine x 2)     Status: None   Collection Time: 12/02/19  1:15 PM   Specimen: BLOOD RIGHT HAND  Result Value Ref Range Status   Specimen Description BLOOD RIGHT HAND  Final   Special Requests   Final    BOTTLES DRAWN AEROBIC AND ANAEROBIC Blood Culture adequate volume   Culture   Final    NO GROWTH 5 DAYS Performed at [facility], [place] [address], [place], [place] [address]    Report Status 12/07/2019 FINAL  Final  Culture, blood (routine x 2)     Status: None   Collection Time: 12/02/19  1:20 PM   Specimen: BLOOD RIGHT HAND  Result Value Ref Range Status   Specimen Description BLOOD RIGHT HAND  Final   Special Requests   Final    BOTTLES DRAWN AEROBIC AND ANAEROBIC Blood Culture adequate volume   Culture   Final    NO GROWTH 5 DAYS Performed at [facility], [place] [address], [place], [place] [address]    Report Status 12/07/2019 FINAL  Final    Today   Subjective    Patrick Ibarra today has no headache,no chest abdominal pain,no new weakness tingling or numbness, feels much better wants to go home today.     Objective   Blood pressure (!) 157/98, pulse 92, temperature 97.7 F (36.5 C), temperature source Oral, resp. rate 14, height '6\' 1"'  (1.854 m), weight 94.1 kg, SpO2 96 %.   Intake/Output Summary (Last 24 hours) at 12/11/2019 1444 Last data filed at 12/11/2019 1106 Gross per 24  hour  Intake 150 ml  Output 2584 ml  Net -2434 ml    Exam  Awake Alert, Oriented x 3, No new F.N deficits, Normal affect [place].AT,PERRAL Supple Neck,No JVD, No cervical lymphadenopathy appriciated.  Symmetrical Chest wall movement, Good air movement bilaterally, CTAB RRR,No Gallops,Rubs or new Murmurs, No Parasternal Heave +ve B.Sounds, Abd Soft, Non tender, No organomegaly appriciated, No rebound -guarding or rigidity. No Cyanosis, Clubbing or edema, right IJ temporary dialysis catheter had some bleeding but has now resolved   Data Review   CBC w Diff:  Lab Results  Component Value Date   WBC 6.0 12/11/2019   HGB 8.2 (L) 12/11/2019   HGB 9.6 (L) 06/10/2019   HCT 26.1 (L) 12/11/2019   HCT 27.8 (L) 06/10/2019   PLT 217 12/11/2019   PLT 227 06/10/2019   LYMPHOPCT 18 12/11/2019   MONOPCT 16 12/11/2019   EOSPCT 4 12/11/2019   BASOPCT 0 12/11/2019    CMP:  Lab Results  Component Value Date   NA 139 12/08/2019   NA 134 03/18/2017   K 4.2 12/08/2019   CL 99 12/08/2019   CO2 25 12/08/2019   BUN 35 (H) 12/08/2019   BUN 135 (HH) 03/18/2017   CREATININE 11.72 (H) 12/08/2019   CREATININE 3.16 (H) 01/05/2014   PROT 6.1 (L) 12/07/2019   PROT 8.0 06/10/2019   ALBUMIN 2.5 (L) 12/08/2019   ALBUMIN 4.2 06/10/2019   BILITOT 0.8 12/07/2019   BILITOT 0.3 06/10/2019   ALKPHOS 36 (L) 12/07/2019   AST 20 12/07/2019   ALT 15 12/07/2019  .   Total Time in preparing paper work, data evaluation and todays exam - 24 minutes  Lala Lund M.D on 12/11/2019 at 2:44 PM  [facility]   Office  [phone]

## 2019-12-13 ENCOUNTER — Telehealth: Payer: Self-pay | Admitting: Nephrology

## 2019-12-13 ENCOUNTER — Telehealth: Payer: Self-pay

## 2019-12-13 ENCOUNTER — Ambulatory Visit: Payer: Medicare Other | Admitting: Internal Medicine

## 2019-12-13 MED FILL — PANTOPRAZOLE SOD DR 40 MG T: 40 | 30 days supply | Qty: 30 | Fill #0

## 2019-12-13 MED FILL — RENA-VITE TABLET: 30 days supply | Qty: 30 | Fill #0

## 2019-12-13 NOTE — Telephone Encounter (Signed)
Transition of care contact from inpatient facility  Date of Discharge:12/11/19 Date of Contact:12/13/19 Method of contact: phone  Talked with: wife -   Patient contact to discuss transition of care from recent inpatient hospitalization. Pateint was admitted to [facility] from : 4/1-4/10/21 with the diagnosis of acute hypoxia respiratory failure due to pulmonary edema from missed dialysis, recent COVID dx  Medication changes were reviewed.  Advised to stop liquid ferrous sulfate and that we will given IV Fe prn  Patient will follow up at outpatient dialysis on _4/13/21 - wife states she will bring patient.   Other follow up needs: ---- or none  Amalia Hailey, PA-C [facility] Pager:  [phone]

## 2019-12-13 NOTE — Telephone Encounter (Signed)
Transition Care Management Follow-up Telephone Call Date of discharge and from where: 12/11/2019, [facility]   Calls placed to patient # [phone] and # [phone], messages left on both numbers requesting a call back to this CM.    He needs to schedule hospital follow up appointment with Dr Wynetta Emery.

## 2019-12-14 ENCOUNTER — Telehealth: Payer: Self-pay

## 2019-12-14 ENCOUNTER — Other Ambulatory Visit: Payer: Self-pay | Admitting: *Deleted

## 2019-12-14 ENCOUNTER — Encounter: Payer: Self-pay | Admitting: *Deleted

## 2019-12-14 DIAGNOSIS — N186 End stage renal disease: Secondary | ICD-10-CM | POA: Diagnosis not present

## 2019-12-14 DIAGNOSIS — E1129 Type 2 diabetes mellitus with other diabetic kidney complication: Secondary | ICD-10-CM | POA: Diagnosis not present

## 2019-12-14 DIAGNOSIS — D509 Iron deficiency anemia, unspecified: Secondary | ICD-10-CM | POA: Diagnosis not present

## 2019-12-14 DIAGNOSIS — Z992 Dependence on renal dialysis: Secondary | ICD-10-CM | POA: Diagnosis not present

## 2019-12-14 DIAGNOSIS — D631 Anemia in chronic kidney disease: Secondary | ICD-10-CM | POA: Diagnosis not present

## 2019-12-14 DIAGNOSIS — N2581 Secondary hyperparathyroidism of renal origin: Secondary | ICD-10-CM | POA: Diagnosis not present

## 2019-12-14 NOTE — Patient Outreach (Signed)
[facility]) Care Management  12/14/2019  Patrick Ibarra [birth year] [id number]   CSW was able to make contact with patient's wife, Patrick Ibarra today to follow-up regarding social work services and resources for patient, as well as to confirm that patient has started back with his hemodialysis treatments.  CSW is aware that patient was discharged from [facility] on Saturday, December 11, 2019, after having been hospitalized for a total of 10 days, due to symptoms associated with End-Stage Renal Disease.  While hospitalized, patient committed to attending hemodialysis treatments three days per week, every week, in addition to having an Arteriovenous (AV) Fistula placed in Left Upper Arm Brachial Artery to Basilic Vein.  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    Patient had been accepted back to the [facility], prior to discharge from the hospital, and is scheduled to receive hemodialysis treatments on Tuesdays, Thursdays and Saturdays, beginning today (Tuesday, December 14, 2019) at 7:20am.  Mrs. Forget confirmed that patient attended his treatment appointment this morning, admitting to feeling a lot better afterwards.  Mrs. Brutus stated, "The hospital staff were able to get patient's insurance coverage straightened out" while patient was hospitalized, confirming that patient still has active Traditional Medicare, Parts A and B, as well as [place] Access Adult Medicaid, through the [place].  Mrs. Randle admitted that patient is able to commit to receiving hemodialysis treatments, as long as he does not have to pay for these services out-of-pocket.   CSW explained to Mrs. Dibiasio that Stanley contacted patient's Case Worker at [employer] [email or web address]) to try and gain a better understanding of why patient no longer receives Social  Security Disability, but Ms. Pina refused to converse with CSW, without having proper consent in writing from patient.  Mrs. Hoos voiced understanding, agreeing to follow-up with Ms. Noland Fordyce herself, but appreciative of CSW's attempt.  CSW inquired as to whether or not Mrs. Duell thought that patient would benefit from counseling and supportive services to assist with his current mental status; however, Mrs. Divelbiss declined, admitting that patient is a very private person, not wanting to discuss his personal business with anyone, even his immediate family.     CSW will perform a case closure on patient, as all goals of treatment have been met from social work standpoint and no additional social work needs have been identified at this time.  CSW will notify patient's Geriatric Nurse Practitioner, also with [facility], Deloria Lair of CSW's plans to close patient's case.  CSW will fax an update to patient's Primary Care Physician, Dr. Karle Plumber to ensure that they are aware of CSW's involvement with patient's plan of care.  CSW was able to confirm that Mrs. Albus has the correct contact information for CSW, encouraging her to contact CSW directly if additional social work needs arise in the near future.  Mrs. Shelnutt also agreed to share CSW's contact information with patient, in the event that he has questions or needs further assistance.  Nat Christen, BSW, MSW, LCSW  Licensed Education officer, [facility]  [address] [place] [address] [place], [place] [address] Physical Address-[address], [place], [place] [address] Toll Free Main # [phone] Fax # [phone] Cell # [phone]  Office # [phone] [email or web address]

## 2019-12-14 NOTE — Telephone Encounter (Signed)
Transition Care Management Follow-up Telephone Call  Date of discharge and from where: 12/11/2019, [facility]   How have you been since you were released from the hospital? He said he is " doing fine."  Had dialysis this morning   Any questions or concerns? none at this time  Items Reviewed:  Did the pt receive and understand the discharge instructions provided?  yes, he said they were reviewed before he left the hospital   Medications obtained and verified? he said that he has all medications and did not have any questions.    Any new allergies since your discharge?  none reported   Dietary orders reviewed? 1.5L fluid restriction  Do you have support at home?  his wife  Other (ie: DME, Home Health, etc) no home health ordered.   Has glucometer  Dialysis: T/T/S at [facility]  He said that COVID quarantine has been completed   Functional Questionnaire: (I = Independent and D = Dependent) ADL's: independent, no assistive devices.HD -   Follow up appointments reviewed:    PCP Hospital f/u appt confirmed? Marland Kitchenappoinmtent with Dr Wynetta Emery 12/28/2019 @ 1610. Informed him that he will be notified if this is a virtual visit or in person  [facility] f/u appt confirmed? 01/21/2020- HD access.  Are transportation arrangements needed? no , he said that his wife takes him to dialysis  If their condition worsens, is the pt aware to call  their PCP or go to the ED? yes  Was the patient provided with contact information for the PCP's office or ED?he has the phone number for the clinic  Was the pt encouraged to call back with questions or concerns?  yes

## 2019-12-14 NOTE — Patient Outreach (Addendum)
[facility]) Care Management  12/14/2019  BOYSIE BONEBRAKE July 12, 1981 [id number]   Unsuccessful outreach for [facility] care patient  Coverage for Deloria Lair, [place] NP   Date of Admission: 12/02/19 Diagnosis: Acute hypoxic respiratory failure due to pulmonary edema in the setting of volume overload secondary to missed hemodialysis Date of Discharge & Facility:  12/11/19 from [facility]  Insurance: Medicare and [place] access medicaid   With brief review of Epic notes, it is noted that Mr & Mrs Benkert have received calls today also from [place] [facility] [facility] CM, Lowell Guitar (for transition of care assessment)  Transition of care services noted to be completed by primary care MD office staff primary care provider office who will refer to [facility] care management if needed.  He has now started dialysis at the [facility] kidney center on [address] on Tuesdays, Thursdays and Saturdays. Received treatment on today 12/14/19  Outreach attempt unsuccessful No answer. [facility] RN CM left HIPAA [facility] Portability and Accountability Act) compliant voicemail message along with CM's and Deloria Lair, NP contact info.   Plan: [facility] RN CM sent an unsuccessful outreach letter  [facility] RN CM updated Deloria Lair via in basket Mr [place] pended for another [facility] call attempt outreach with in the next 4-7 business days  Bralee Feldt L. Lavina Hamman, RN, BSN, [place] Coordinator Office number [phone] Mobile number [phone]  Main [facility] number [phone] Fax number [phone]

## 2019-12-16 ENCOUNTER — Other Ambulatory Visit: Payer: Self-pay | Admitting: *Deleted

## 2019-12-16 ENCOUNTER — Ambulatory Visit: Payer: Self-pay | Admitting: *Deleted

## 2019-12-16 DIAGNOSIS — N2581 Secondary hyperparathyroidism of renal origin: Secondary | ICD-10-CM | POA: Diagnosis not present

## 2019-12-16 DIAGNOSIS — N186 End stage renal disease: Secondary | ICD-10-CM | POA: Diagnosis not present

## 2019-12-16 DIAGNOSIS — D509 Iron deficiency anemia, unspecified: Secondary | ICD-10-CM | POA: Diagnosis not present

## 2019-12-16 DIAGNOSIS — Z992 Dependence on renal dialysis: Secondary | ICD-10-CM | POA: Diagnosis not present

## 2019-12-16 DIAGNOSIS — D631 Anemia in chronic kidney disease: Secondary | ICD-10-CM | POA: Diagnosis not present

## 2019-12-16 NOTE — Patient Outreach (Signed)
[place] [facility]) Care Management  12/16/2019  MOYSES PAVEY 04/16/1981 [id number]    Telephone outreach to check on pt post acute care hospitalization, new AV fistula placement, starting outpt dialysis.  No answer, left a message to return my call.  [name]. [name], MSN, [facility] Gerontological Nurse Practitioner [facility] Care Management [phone]

## 2019-12-17 ENCOUNTER — Ambulatory Visit: Payer: Self-pay | Admitting: *Deleted

## 2019-12-18 DIAGNOSIS — N186 End stage renal disease: Secondary | ICD-10-CM | POA: Diagnosis not present

## 2019-12-18 DIAGNOSIS — D509 Iron deficiency anemia, unspecified: Secondary | ICD-10-CM | POA: Diagnosis not present

## 2019-12-18 DIAGNOSIS — N2581 Secondary hyperparathyroidism of renal origin: Secondary | ICD-10-CM | POA: Diagnosis not present

## 2019-12-18 DIAGNOSIS — Z992 Dependence on renal dialysis: Secondary | ICD-10-CM | POA: Diagnosis not present

## 2019-12-18 DIAGNOSIS — D631 Anemia in chronic kidney disease: Secondary | ICD-10-CM | POA: Diagnosis not present

## 2019-12-20 ENCOUNTER — Other Ambulatory Visit: Payer: Self-pay | Admitting: *Deleted

## 2019-12-20 ENCOUNTER — Encounter: Payer: Self-pay | Admitting: *Deleted

## 2019-12-20 NOTE — Patient Outreach (Signed)
[facility]) Care Management  12/20/2019  MAHAMADOU WELTZ [birth year] [id number]   Third outreach to complete a transition of care call. I was only able to speak to Mrs. Mussa briefly. She advised pt was home (he did not answer the call). Requested that she encourge pt to communicate with me. Our program is voluntary, he can decide if he wants to participate or not. He may feel at this time he has plenty of support from the nephrology and dialysis teams.  I will send him an unsuccessful letter today.  [name]. [name], MSN, [facility] Gerontological Nurse Practitioner [facility] Care Management [phone]

## 2019-12-21 DIAGNOSIS — N186 End stage renal disease: Secondary | ICD-10-CM | POA: Diagnosis not present

## 2019-12-21 DIAGNOSIS — N2581 Secondary hyperparathyroidism of renal origin: Secondary | ICD-10-CM | POA: Diagnosis not present

## 2019-12-21 DIAGNOSIS — Z992 Dependence on renal dialysis: Secondary | ICD-10-CM | POA: Diagnosis not present

## 2019-12-21 DIAGNOSIS — D509 Iron deficiency anemia, unspecified: Secondary | ICD-10-CM | POA: Diagnosis not present

## 2019-12-21 DIAGNOSIS — D631 Anemia in chronic kidney disease: Secondary | ICD-10-CM | POA: Diagnosis not present

## 2019-12-22 ENCOUNTER — Telehealth: Payer: Self-pay

## 2019-12-22 NOTE — Telephone Encounter (Signed)
As per Mickel Baas Marsh/[employer] [place], they have an open case with the patient and are providing assistance as requested

## 2019-12-23 DIAGNOSIS — D631 Anemia in chronic kidney disease: Secondary | ICD-10-CM | POA: Diagnosis not present

## 2019-12-23 DIAGNOSIS — D509 Iron deficiency anemia, unspecified: Secondary | ICD-10-CM | POA: Diagnosis not present

## 2019-12-23 DIAGNOSIS — Z992 Dependence on renal dialysis: Secondary | ICD-10-CM | POA: Diagnosis not present

## 2019-12-23 DIAGNOSIS — N2581 Secondary hyperparathyroidism of renal origin: Secondary | ICD-10-CM | POA: Diagnosis not present

## 2019-12-23 DIAGNOSIS — N186 End stage renal disease: Secondary | ICD-10-CM | POA: Diagnosis not present

## 2019-12-28 ENCOUNTER — Ambulatory Visit: Payer: Medicare Other | Admitting: Internal Medicine

## 2019-12-28 ENCOUNTER — Other Ambulatory Visit: Payer: Self-pay

## 2019-12-28 DIAGNOSIS — Z992 Dependence on renal dialysis: Secondary | ICD-10-CM | POA: Diagnosis not present

## 2019-12-28 DIAGNOSIS — N186 End stage renal disease: Secondary | ICD-10-CM | POA: Diagnosis not present

## 2019-12-28 DIAGNOSIS — D509 Iron deficiency anemia, unspecified: Secondary | ICD-10-CM | POA: Diagnosis not present

## 2019-12-28 DIAGNOSIS — N2581 Secondary hyperparathyroidism of renal origin: Secondary | ICD-10-CM | POA: Diagnosis not present

## 2019-12-28 DIAGNOSIS — D631 Anemia in chronic kidney disease: Secondary | ICD-10-CM | POA: Diagnosis not present

## 2019-12-30 DIAGNOSIS — N2581 Secondary hyperparathyroidism of renal origin: Secondary | ICD-10-CM | POA: Diagnosis not present

## 2019-12-30 DIAGNOSIS — N186 End stage renal disease: Secondary | ICD-10-CM | POA: Diagnosis not present

## 2019-12-30 DIAGNOSIS — D631 Anemia in chronic kidney disease: Secondary | ICD-10-CM | POA: Diagnosis not present

## 2019-12-30 DIAGNOSIS — D509 Iron deficiency anemia, unspecified: Secondary | ICD-10-CM | POA: Diagnosis not present

## 2019-12-30 DIAGNOSIS — Z992 Dependence on renal dialysis: Secondary | ICD-10-CM | POA: Diagnosis not present

## 2019-12-31 ENCOUNTER — Other Ambulatory Visit: Payer: Self-pay

## 2019-12-31 ENCOUNTER — Encounter: Payer: Self-pay | Admitting: Internal Medicine

## 2019-12-31 ENCOUNTER — Ambulatory Visit: Payer: Medicare Other | Attending: Internal Medicine | Admitting: Internal Medicine

## 2019-12-31 ENCOUNTER — Other Ambulatory Visit: Payer: Self-pay | Admitting: *Deleted

## 2019-12-31 VITALS — BP 136/88 | HR 97 | Temp 97.7°F | Resp 16 | Wt 213.4 lb

## 2019-12-31 DIAGNOSIS — Z79899 Other long term (current) drug therapy: Secondary | ICD-10-CM | POA: Diagnosis not present

## 2019-12-31 DIAGNOSIS — Z09 Encounter for follow-up examination after completed treatment for conditions other than malignant neoplasm: Secondary | ICD-10-CM | POA: Diagnosis not present

## 2019-12-31 DIAGNOSIS — Z8616 Personal history of COVID-19: Secondary | ICD-10-CM | POA: Diagnosis not present

## 2019-12-31 DIAGNOSIS — I120 Hypertensive chronic kidney disease with stage 5 chronic kidney disease or end stage renal disease: Secondary | ICD-10-CM | POA: Insufficient documentation

## 2019-12-31 DIAGNOSIS — E669 Obesity, unspecified: Secondary | ICD-10-CM | POA: Diagnosis not present

## 2019-12-31 DIAGNOSIS — Z992 Dependence on renal dialysis: Secondary | ICD-10-CM | POA: Insufficient documentation

## 2019-12-31 DIAGNOSIS — D631 Anemia in chronic kidney disease: Secondary | ICD-10-CM | POA: Diagnosis not present

## 2019-12-31 DIAGNOSIS — Z7901 Long term (current) use of anticoagulants: Secondary | ICD-10-CM | POA: Insufficient documentation

## 2019-12-31 DIAGNOSIS — K226 Gastro-esophageal laceration-hemorrhage syndrome: Secondary | ICD-10-CM | POA: Insufficient documentation

## 2019-12-31 DIAGNOSIS — E1129 Type 2 diabetes mellitus with other diabetic kidney complication: Secondary | ICD-10-CM | POA: Diagnosis not present

## 2019-12-31 DIAGNOSIS — N186 End stage renal disease: Secondary | ICD-10-CM | POA: Diagnosis not present

## 2019-12-31 DIAGNOSIS — E1122 Type 2 diabetes mellitus with diabetic chronic kidney disease: Secondary | ICD-10-CM | POA: Insufficient documentation

## 2019-12-31 DIAGNOSIS — I10 Essential (primary) hypertension: Secondary | ICD-10-CM

## 2019-12-31 DIAGNOSIS — K859 Acute pancreatitis without necrosis or infection, unspecified: Secondary | ICD-10-CM | POA: Insufficient documentation

## 2019-12-31 DIAGNOSIS — Z6828 Body mass index (BMI) 28.0-28.9, adult: Secondary | ICD-10-CM | POA: Insufficient documentation

## 2019-12-31 DIAGNOSIS — Z833 Family history of diabetes mellitus: Secondary | ICD-10-CM | POA: Diagnosis not present

## 2019-12-31 DIAGNOSIS — Z794 Long term (current) use of insulin: Secondary | ICD-10-CM

## 2019-12-31 LAB — GLUCOSE, POCT (MANUAL RESULT ENTRY): POC Glucose: 86 mg/dl (ref 70–99)

## 2019-12-31 NOTE — Patient Outreach (Signed)
[facility]) Care Management  12/31/2019  Patrick Ibarra 1981/01/23 [id number]  3rd unsuccessful outreach to engage for care management services. I was able to leave a message stating that I am available for future service and gave my phone number one last time. An unsuccessful outreach letter, was previously sent.  [name]. [name], MSN, [facility] Gerontological Nurse Practitioner [facility] Care Management [phone]

## 2019-12-31 NOTE — Progress Notes (Signed)
Patient ID: Patrick Ibarra, male    DOB: 07/14/1981  MRN: [id number]  CC: Hospitalization Follow-up Date of hospitalization: 4/1-06/2020 Date of telephone call from case worker: 12/14/2019  Subjective: Patrick Ibarra is a 39 y.o. male who presents for transition of care His concerns today include:  ESRD, HTN, DM.  Patient hospitalized x2 since last visit with me but with fluid overload.  On this recent admission he was admitted to the ICU for severe respiratory distress with hypoxia requiring intubation and emergent hemodialysis.  Fluid overload was due to patient refusing hemodialysis wanting to try some natural remedies.  Of note, he was also diagnosed with Covid last month but Covid test during this last hospitalization was negative.  He had severe anemia secondary to anemia of chronic disease associated with end-stage renal disease.  He is receiving Aranesp.  He was transfused 1 unit of packed red blood cells during hospitalization. Patient dialyzed during hospitalization.  He finally agreed to get back on dialysis upon discharge.  Today: Patient reports that he is feeling much better.  He is compliant with going to dialysis Tuesdays Thursdays and Saturdays.  He now has temporary Medicare.  He is taking his blood pressure medications Norvasc and carvedilol as prescribed.  He is limiting salt in the foods.  Denies any chest pains or shortness of breath at this time.  No lower extremity edema.  DM: He is on sliding scale NovoLog.  He states that he has not had to take it in a while as his blood sugars have been 80-110.  Most recent A1c done on 12/03/2019 was 5.4.  Anemia of chronic disease: Receiving iron intermittently at hemodialysis.  States that he was given iron yesterday and his hemoglobin around that time was about 8. Patient Active Problem List   Diagnosis Date Noted  . Counseling regarding advance care planning and goals of care   . Respiratory failure ([place]) 12/02/2019  . Goals of  care, counseling/discussion   . Renal failure 11/16/2019  . Chronic renal failure 11/16/2019  . Palliative care by specialist   . Fatigue   . SOB (shortness of breath) 10/01/2019  . Noncompliance by refusing service 10/01/2019  . Arteriovenous fistula for hemodialysis in place, primary ([place]) 09/26/2019  . DNR (do not resuscitate) discussion 09/05/2017  . Diabetes mellitus type II, uncontrolled ([place]) 09/04/2017  . Mallory-Weiss tear 09/04/2017  . Reactive depression 09/04/2017  . Pancreatitis, acute 04/04/2017  . GI bleed 04/03/2017  . Anemia of chronic disease 04/03/2017  . Obesity 03/01/2017  . Anemia of chronic kidney failure 03/01/2017  . Essential hypertension 01/05/2014  . Diabetes mellitus ([place]) 12/24/2011  . ESRD (end stage renal disease) ([place]) 12/24/2011     Current Outpatient Medications on File Prior to Visit  Medication Sig Dispense Refill  . Accu-Chek FastClix Lancets MISC Use as instructed to check blood sugar once daily. E11.9 102 each 11  . amLODipine (NORVASC) 10 MG tablet Take 1 tablet (10 mg total) by mouth daily. 30 tablet 5  . Blood Glucose Monitoring Suppl (ACCU-CHEK GUIDE ME) w/Device KIT 1 kit by Does not apply route daily. Use as instructed to check blood sugar once daily. E11.9 1 kit 0  . carvedilol (COREG) 12.5 MG tablet Take 1 tablet (12.5 mg total) by mouth 2 (two) times daily with a meal. 60 tablet 4  . Ferrous Sulfate (FERROUSUL PO) Take 5 mLs by mouth daily after breakfast.    . glucose blood (ACCU-CHEK GUIDE) test strip Use  as instructed to check blood sugar once daily. E11.9 100 each 12  . insulin aspart (NOVOLOG FLEXPEN) 100 UNIT/ML FlexPen 2 units subcut with meals for BS greater than 150. (Patient taking differently: Inject 2 Units into the skin See admin instructions. Inject 2 units into the skin three times a day with meals for a BGL greater than 150) 15 mL 2  . Insulin Pen Needle (PEN NEEDLES) 31G X 8 MM MISC UAD 100 each 6  . lovastatin  (MEVACOR) 20 MG tablet Take 1 tablet (20 mg total) by mouth at bedtime. 30 tablet 3  . multivitamin (RENA-VIT) TABS tablet Place 1 tablet into feeding tube at bedtime. 30 tablet 0  . pantoprazole (PROTONIX) 40 MG tablet Take 1 tablet (40 mg total) by mouth at bedtime. 30 tablet 0   No current facility-administered medications on file prior to visit.    No Known Allergies  Social History   Socioeconomic History  . Marital status: Married    Spouse name: Hilary Pundt  . Number of children: Not on file  . Years of education: 56  . Highest education level: High school graduate  Occupational History  . Occupation: Disabled  Tobacco Use  . Smoking status: Never Smoker  . Smokeless tobacco: Never Used  Substance and Sexual Activity  . Alcohol use: Not Currently  . Drug use: No  . Sexual activity: Yes  Other Topics Concern  . Not on file  Social History Narrative   He is married with step kids. He works at [employer].    Social Determinants of Health   Financial Resource Strain: High Risk  . Difficulty of Paying Living Expenses: Very hard  Food Insecurity: No Food Insecurity  . Worried About Charity fundraiser in the Last Year: Never true  . Ran Out of Food in the Last Year: Never true  Transportation Needs: No Transportation Needs  . Lack of Transportation (Medical): No  . Lack of Transportation (Non-Medical): No  Physical Activity: Inactive  . Days of Exercise per Week: 0 days  . Minutes of Exercise per Session: 0 min  Stress: Stress Concern Present  . Feeling of Stress : Very much  Social Connections: Slightly Isolated  . Frequency of Communication with Friends and Family: More than three times a week  . Frequency of Social Gatherings with Friends and Family: More than three times a week  . Attends Religious Services: More than 4 times per year  . Active Member of Clubs or Organizations: No  . Attends Archivist Meetings: Never  . Marital Status:  Married  Human resources officer Violence: Not At Risk  . Fear of Current or Ex-Partner: No  . Emotionally Abused: No  . Physically Abused: No  . Sexually Abused: No    Family History  Problem Relation Age of Onset  . Hypertension Mother   . Diabetes Mother   . Hypertension Father     Past Surgical History:  Procedure Laterality Date  . AV FISTULA PLACEMENT Left 04/10/2017   Procedure: ARTERIOVENOUS (AV) FISTULA CREATION LEFT ARM;  Surgeon: Conrad Carbondale, MD;  Location: [place];  Service: Vascular;  Laterality: Left;  . AV FISTULA PLACEMENT Left 12/10/2019   Procedure: First stage basilic vein transposition left arm;  Surgeon: Waynetta Sandy, MD;  Location: [facility];  Service: Vascular;  Laterality: Left;  . ESOPHAGOGASTRODUODENOSCOPY (EGD) WITH PROPOFOL N/A 04/04/2017   Procedure: ESOPHAGOGASTRODUODENOSCOPY (EGD) WITH PROPOFOL;  Surgeon: Carol Ada, MD;  Location: [facility];  Service: Endoscopy;  Laterality: N/A;  . 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 INSERTION OF DIALYSIS CATHETER Right 04/10/2017   Procedure: INSERTION OF DIALYSIS CATHETER - RIGHT INTERNAL JUGULAR PLACEMENT;  Surgeon: Conrad Windsor, MD;  Location: [facility];  Service: Vascular;  Laterality: Right;  . IR FLUORO GUIDE CV LINE RIGHT  12/06/2019  . IR US GUIDE VASC ACCESS RIGHT  12/06/2019  . REVISON OF ARTERIOVENOUS FISTULA Left 12/10/2019   Procedure: Revison Of Arteriovenous Fistula;  Surgeon: Waynetta Sandy, MD;  Location: [facility] [place];  Service: Vascular;  Laterality: Left;  . 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  TONSILLECTOMY AND ADENOIDECTOMY      ROS: Review of Systems Negative except as stated above  PHYSICAL EXAM: BP 136/88   Pulse 97   Temp 97.7 F (36.5 C)   Resp 16   Wt 213 lb 6.4 oz (96.8 kg)   SpO2 99%   BMI 28.15 kg/m   Physical Exam   General appearance - alert, well appearing, young to middle-aged African-American male in NAD  Mental status - normal mood, behavior, speech, dress, motor activity, and thought processes Neck - supple, no significant adenopathy Chest - clear to auscultation, no wheezes, rales or rhonchi, symmetric air entry Heart -regular rate and rhythm with systolic ejection murmur along the left sternal border.  Extremities -trace lower extremity edema. CMP Latest Ref Rng & Units 12/08/2019 12/07/2019 12/06/2019  Glucose 70 - 99 mg/dL 74 101(H) 91  BUN 6 - 20 mg/dL 35(H) 54(H) 46(H)  Creatinine 0.61 - 1.24 mg/dL 11.72(H) 15.59(H) 13.51(H)  Sodium 135 - 145 mmol/L 139 140 140  Potassium 3.5 - 5.1 mmol/L 4.2 4.0 3.8  Chloride 98 - 111 mmol/L 99 100 100  CO2 22 - 32 mmol/L '25 23 24  ' Calcium 8.9 - 10.3 mg/dL 8.4(L) 8.2(L) 7.8(L)  Total Protein 6.5 - 8.1 g/dL - 6.1(L) -  Total Bilirubin 0.3 - 1.2 mg/dL - 0.8 -  Alkaline Phos 38 - 126 U/L - 36(L) -  AST 15 - 41 U/L - 20 -  ALT 0 - 44 U/L - 15 -   Lipid Panel     Component Value Date/Time   CHOL 232 (H) 06/10/2019 1214   TRIG 204 (H) 12/02/2019 1315   HDL 54 06/10/2019 1214   CHOLHDL 4.3 06/10/2019 1214   LDLCALC 136 (H) 06/10/2019 1214    CBC    Component Value Date/Time   WBC 6.0 12/11/2019 0423   RBC 2.87 (L) 12/11/2019 0423   HGB 8.2 (L) 12/11/2019 0423   HGB 9.6 (L) 06/10/2019 1214   HCT 26.1 (L) 12/11/2019 0423   HCT 27.8 (L) 06/10/2019 1214   PLT 217 12/11/2019 0423   PLT 227 06/10/2019 1214   MCV 90.9 12/11/2019 0423   MCV 91 06/10/2019 1214   MCH 28.6 12/11/2019 0423   MCHC 31.4 12/11/2019 0423   RDW 15.7 (H) 12/11/2019 0423   RDW 14.5 06/10/2019 1214   LYMPHSABS 1.1 12/11/2019 0423     MONOABS 1.0 12/11/2019 0423   EOSABS 0.2 12/11/2019 0423   BASOSABS 0.0  12/11/2019 0423    ASSESSMENT AND PLAN: 1. Hospital discharge follow-up   2. Controlled type 2 diabetes mellitus with other diabetic kidney complication, with long-term current use of insulin (HCC) Control.  Continue NovoLog sliding scale as needed - POCT glucose (manual entry) - Ambulatory referral to Ophthalmology  3. ESRD (end stage renal disease) ([place]) Patient now going to dialysis.  Encouraged him to remain compliant  4. Essential hypertension Blood pressure today is acceptable.  Continue Norvasc and carvedilol  5. Anemia in ESRD (end-stage renal disease) ([place]) Receiving iron or Aranesp intermittently during dialysis  6. History of COVID-19 Resolved.  Advised patient that if he plans to get the COVID-19 vaccine he should wait at least 3 months from Covid diagnosis.    Patient was given the opportunity to ask questions.  Patient verbalized understanding of the plan and was able to repeat key elements of the plan.   Orders Placed This Encounter  Procedures  . POCT glucose (manual entry)     Requested Prescriptions    No prescriptions requested or ordered in this encounter    No follow-ups on file.  Karle Plumber, MD, FACP

## 2020-01-01 DIAGNOSIS — Z992 Dependence on renal dialysis: Secondary | ICD-10-CM | POA: Diagnosis not present

## 2020-01-01 DIAGNOSIS — D509 Iron deficiency anemia, unspecified: Secondary | ICD-10-CM | POA: Diagnosis not present

## 2020-01-01 DIAGNOSIS — N2581 Secondary hyperparathyroidism of renal origin: Secondary | ICD-10-CM | POA: Diagnosis not present

## 2020-01-01 DIAGNOSIS — N186 End stage renal disease: Secondary | ICD-10-CM | POA: Diagnosis not present

## 2020-01-01 DIAGNOSIS — I129 Hypertensive chronic kidney disease with stage 1 through stage 4 chronic kidney disease, or unspecified chronic kidney disease: Secondary | ICD-10-CM | POA: Diagnosis not present

## 2020-01-04 DIAGNOSIS — D509 Iron deficiency anemia, unspecified: Secondary | ICD-10-CM | POA: Diagnosis not present

## 2020-01-04 DIAGNOSIS — N186 End stage renal disease: Secondary | ICD-10-CM | POA: Diagnosis not present

## 2020-01-04 DIAGNOSIS — N2581 Secondary hyperparathyroidism of renal origin: Secondary | ICD-10-CM | POA: Diagnosis not present

## 2020-01-04 DIAGNOSIS — Z992 Dependence on renal dialysis: Secondary | ICD-10-CM | POA: Diagnosis not present

## 2020-01-06 DIAGNOSIS — N2581 Secondary hyperparathyroidism of renal origin: Secondary | ICD-10-CM | POA: Diagnosis not present

## 2020-01-06 DIAGNOSIS — D509 Iron deficiency anemia, unspecified: Secondary | ICD-10-CM | POA: Diagnosis not present

## 2020-01-06 DIAGNOSIS — Z992 Dependence on renal dialysis: Secondary | ICD-10-CM | POA: Diagnosis not present

## 2020-01-06 DIAGNOSIS — N186 End stage renal disease: Secondary | ICD-10-CM | POA: Diagnosis not present

## 2020-01-11 DIAGNOSIS — N2581 Secondary hyperparathyroidism of renal origin: Secondary | ICD-10-CM | POA: Diagnosis not present

## 2020-01-11 DIAGNOSIS — Z992 Dependence on renal dialysis: Secondary | ICD-10-CM | POA: Diagnosis not present

## 2020-01-11 DIAGNOSIS — N186 End stage renal disease: Secondary | ICD-10-CM | POA: Diagnosis not present

## 2020-01-11 DIAGNOSIS — D509 Iron deficiency anemia, unspecified: Secondary | ICD-10-CM | POA: Diagnosis not present

## 2020-01-13 DIAGNOSIS — Z992 Dependence on renal dialysis: Secondary | ICD-10-CM | POA: Diagnosis not present

## 2020-01-13 DIAGNOSIS — N186 End stage renal disease: Secondary | ICD-10-CM | POA: Diagnosis not present

## 2020-01-13 DIAGNOSIS — D509 Iron deficiency anemia, unspecified: Secondary | ICD-10-CM | POA: Diagnosis not present

## 2020-01-13 DIAGNOSIS — N2581 Secondary hyperparathyroidism of renal origin: Secondary | ICD-10-CM | POA: Diagnosis not present

## 2020-01-14 ENCOUNTER — Other Ambulatory Visit: Payer: Self-pay | Admitting: Internal Medicine

## 2020-01-15 DIAGNOSIS — Z992 Dependence on renal dialysis: Secondary | ICD-10-CM | POA: Diagnosis not present

## 2020-01-15 DIAGNOSIS — N186 End stage renal disease: Secondary | ICD-10-CM | POA: Diagnosis not present

## 2020-01-15 DIAGNOSIS — D509 Iron deficiency anemia, unspecified: Secondary | ICD-10-CM | POA: Diagnosis not present

## 2020-01-15 DIAGNOSIS — N2581 Secondary hyperparathyroidism of renal origin: Secondary | ICD-10-CM | POA: Diagnosis not present

## 2020-01-17 MED FILL — LOVASTATIN 20 MG TABS: 20 | 30 days supply | Qty: 30 | Fill #0

## 2020-01-18 ENCOUNTER — Other Ambulatory Visit: Payer: Self-pay | Admitting: *Deleted

## 2020-01-18 DIAGNOSIS — N2581 Secondary hyperparathyroidism of renal origin: Secondary | ICD-10-CM | POA: Diagnosis not present

## 2020-01-18 DIAGNOSIS — D509 Iron deficiency anemia, unspecified: Secondary | ICD-10-CM | POA: Diagnosis not present

## 2020-01-18 DIAGNOSIS — N186 End stage renal disease: Secondary | ICD-10-CM

## 2020-01-18 DIAGNOSIS — Z992 Dependence on renal dialysis: Secondary | ICD-10-CM | POA: Diagnosis not present

## 2020-01-20 DIAGNOSIS — Z992 Dependence on renal dialysis: Secondary | ICD-10-CM | POA: Diagnosis not present

## 2020-01-20 DIAGNOSIS — N2581 Secondary hyperparathyroidism of renal origin: Secondary | ICD-10-CM | POA: Diagnosis not present

## 2020-01-20 DIAGNOSIS — N186 End stage renal disease: Secondary | ICD-10-CM | POA: Diagnosis not present

## 2020-01-20 DIAGNOSIS — D509 Iron deficiency anemia, unspecified: Secondary | ICD-10-CM | POA: Diagnosis not present

## 2020-01-21 ENCOUNTER — Ambulatory Visit (HOSPITAL_COMMUNITY): Payer: Medicare Other

## 2020-01-25 DIAGNOSIS — Z992 Dependence on renal dialysis: Secondary | ICD-10-CM | POA: Diagnosis not present

## 2020-01-25 DIAGNOSIS — D509 Iron deficiency anemia, unspecified: Secondary | ICD-10-CM | POA: Diagnosis not present

## 2020-01-25 DIAGNOSIS — N2581 Secondary hyperparathyroidism of renal origin: Secondary | ICD-10-CM | POA: Diagnosis not present

## 2020-01-25 DIAGNOSIS — N186 End stage renal disease: Secondary | ICD-10-CM | POA: Diagnosis not present

## 2020-01-26 ENCOUNTER — Telehealth: Payer: Self-pay

## 2020-01-26 NOTE — Telephone Encounter (Signed)
As per Mickel Baas Marsh/[employer] [place], they continue to work with the patient on his SSI overpayment issue

## 2020-01-27 DIAGNOSIS — Z992 Dependence on renal dialysis: Secondary | ICD-10-CM | POA: Diagnosis not present

## 2020-01-27 DIAGNOSIS — N186 End stage renal disease: Secondary | ICD-10-CM | POA: Diagnosis not present

## 2020-01-27 DIAGNOSIS — D509 Iron deficiency anemia, unspecified: Secondary | ICD-10-CM | POA: Diagnosis not present

## 2020-01-27 DIAGNOSIS — N2581 Secondary hyperparathyroidism of renal origin: Secondary | ICD-10-CM | POA: Diagnosis not present

## 2020-01-29 DIAGNOSIS — N2581 Secondary hyperparathyroidism of renal origin: Secondary | ICD-10-CM | POA: Diagnosis not present

## 2020-01-29 DIAGNOSIS — N186 End stage renal disease: Secondary | ICD-10-CM | POA: Diagnosis not present

## 2020-01-29 DIAGNOSIS — D509 Iron deficiency anemia, unspecified: Secondary | ICD-10-CM | POA: Diagnosis not present

## 2020-01-29 DIAGNOSIS — Z992 Dependence on renal dialysis: Secondary | ICD-10-CM | POA: Diagnosis not present

## 2020-02-11 ENCOUNTER — Other Ambulatory Visit: Payer: Self-pay

## 2020-02-11 ENCOUNTER — Ambulatory Visit (HOSPITAL_COMMUNITY)
Admission: RE | Admit: 2020-02-11 | Discharge: 2020-02-11 | Disposition: A | Discharge status: Home / Self Care | Payer: Medicare Other | Source: Ambulatory Visit | Attending: Vascular Surgery | Admitting: Vascular Surgery

## 2020-02-11 ENCOUNTER — Ambulatory Visit (INDEPENDENT_AMBULATORY_CARE_PROVIDER_SITE_OTHER): Payer: Self-pay | Admitting: Physician Assistant

## 2020-02-11 VITALS — BP 98/61 | HR 86 | Temp 97.5°F | Resp 16 | Ht 73.0 in | Wt 210.0 lb

## 2020-02-11 DIAGNOSIS — N186 End stage renal disease: Secondary | ICD-10-CM | POA: Diagnosis not present

## 2020-02-11 NOTE — Progress Notes (Signed)
    Postoperative Access Visit   History of Present Illness   Patrick Ibarra is a 39 y.o. year old male who presents for postoperative follow-up for: left first stage basilic vein transposition and revision of thrombosed left upper arm cephalic fistula with excision of pseudoaneurysm 12/10/19 by Dr. Donzetta Matters. The left upper arm cephalic fistula was previously ligated on 09/26/19 due to bleeding from a necrotic ulceration over the fistula. [name] wounds are well healed.  The patient notes no steal symptoms  He currently is dialyzing via [place] on Tues/ Thurs/ Sat at the [facility] Location  Physical Examination   Vitals:   02/11/20 1250  BP: 98/61  Pulse: 86  Resp: 16  Temp: (!) 97.5 F (36.4 C)  TempSrc: Temporal  SpO2: 100%  Weight: 210 lb (95.3 kg)  Height: 6\' 1"  (1.854 m)   Body mass index is 27.71 kg/m.  left arm Incision is well healed, 2+ radial pulse, hand grip is 5/5, sensation in digits is intact, palpable thrill, bruit can  be auscultated     Medical Decision Making   Patrick Ibarra is a 39 y.o. year old male who presents s/p left brachiobasilic vein fistula andrevision of thrombosed left upper arm cephalic fistula with excision of pseudoaneurysm 12/10/19 by Dr. Donzetta Matters.  His incisions are healing well. Fistula is functioning well with good thrill and bruit. On today's non invasive study the vein is maturing very well with diameter > 0.7 but the vein is deeper than 0.6 and will need to be transposed for easier access.  He is currently on HD TTS.    Patent is without signs or symptoms of steal syndrome  He does  Not take any blood thinners  He will be scheduled for a left 2nd stage AV fistula transposition  Will try to have this scheduled on a non dialysis day  Karoline Caldwell, PA-C [facility] Office: [phone]  Clinic MD: Dr. Donzetta Matters

## 2020-02-14 ENCOUNTER — Inpatient Hospital Stay (HOSPITAL_COMMUNITY): Admission: RE | Admit: 2020-02-14 | Payer: Medicare Other | Source: Ambulatory Visit

## 2020-02-14 ENCOUNTER — Other Ambulatory Visit: Payer: Self-pay

## 2020-02-14 ENCOUNTER — Encounter (HOSPITAL_COMMUNITY): Payer: Self-pay | Admitting: Vascular Surgery

## 2020-02-14 NOTE — Progress Notes (Signed)
Spoke with patient's WIfe Leane Platt [phone].  Wife Leane Platt states patient does not have SOB, fever, cough or chest pain.  PCP - Dr Karle Plumber Cardiologist - n/a  Chest x-ray - 12/02/19 (1V) EKG - 12/02/19 Stress Test - 04/08/19 ECHO - 10/21/17 Cardiac Cath - n/a  Fasting Blood Sugar - 90-100s Checks Blood Sugar __2__ times a day  . THE MORNING OF SURGERY, do not take Novolog Insulin unless your CBG is greater than 220.  If CBG greater than 220, you may take  of your sliding scale (correction) dose of insulin.  . If your blood sugar is less than 70 mg/dL, you will need to treat for low blood sugar: o Treat a low blood sugar (less than 70 mg/dL) with  cup of clear juice (cranberry or apple), 4 glucose tablets, OR glucose gel. o Recheck blood sugar in 15 minutes after treatment (to make sure it is greater than 70 mg/dL). If your blood sugar is not greater than 70 mg/dL on recheck, call [phone] for further instructions.  Anesthesia review: Yes  STOP now taking any Aspirin (unless otherwise instructed by your surgeon), Aleve, Naproxen, Ibuprofen, Motrin, Advil, Goody's, BC's, all herbal medications, fish oil, and all vitamins.   Coronavirus Screening Covid test scheduled 02/14/20 Do you have any of the following symptoms:  Cough yes/no: No Fever (>100.21F)  yes/no: No Runny nose yes/no: No Sore throat yes/no: No Difficulty breathing/shortness of breath  yes/no: No  Have you traveled in the last 14 days and where? yes/no: No  Wife Leane Platt verbalized understanding of instructions that were given via phone.

## 2020-02-15 ENCOUNTER — Other Ambulatory Visit (HOSPITAL_COMMUNITY)
Admission: RE | Admit: 2020-02-15 | Discharge: 2020-02-15 | Disposition: A | Discharge status: Home / Self Care | Payer: Medicare Other | Source: Ambulatory Visit | Attending: Vascular Surgery | Admitting: Vascular Surgery

## 2020-02-15 DIAGNOSIS — Z20822 Contact with and (suspected) exposure to covid-19: Secondary | ICD-10-CM | POA: Diagnosis not present

## 2020-02-15 DIAGNOSIS — Z01812 Encounter for preprocedural laboratory examination: Secondary | ICD-10-CM | POA: Diagnosis present

## 2020-02-15 LAB — SARS CORONAVIRUS 2 (TAT 6-24 HRS): SARS Coronavirus 2: NEGATIVE

## 2020-02-15 NOTE — Anesthesia Preprocedure Evaluation (Addendum)
Anesthesia Evaluation  Patient identified by MRN, date of birth, ID band Patient awake    Reviewed: Allergy & Precautions, NPO status , Patient's Chart, lab work & pertinent test results  Airway Mallampati: II  TM Distance: >3 FB Neck ROM: Full    Dental  (+) Teeth Intact, Dental Advisory Given   Pulmonary neg pulmonary ROS,    Pulmonary exam normal breath sounds clear to auscultation       Cardiovascular hypertension, Pt. on home beta blockers and Pt. on medications Normal cardiovascular exam Rhythm:Regular Rate:Normal     Neuro/Psych negative neurological ROS     GI/Hepatic Neg liver ROS, GERD  Medicated,  Endo/Other  diabetes, Type 2, Insulin Dependent  Renal/GU Dialysis and ESRFRenal disease (TTHS)K+ 4.4     Musculoskeletal negative musculoskeletal ROS (+)   Abdominal   Peds  Hematology negative hematology ROS (+)   Anesthesia Other Findings   Reproductive/Obstetrics                            Anesthesia Physical Anesthesia Plan  ASA: III  Anesthesia Plan: General   Post-op Pain Management:    Induction: Intravenous  PONV Risk Score and Plan: 2 and Dexamethasone, Ondansetron and Midazolam  Airway Management Planned: LMA  Additional Equipment:   Intra-op Plan:   Post-operative Plan: Extubation in OR  Informed Consent: I have reviewed the patients History and Physical, chart, labs and discussed the procedure including the risks, benefits and alternatives for the proposed anesthesia with the patient or authorized representative who has indicated his/her understanding and acceptance.     Dental advisory given  Plan Discussed with: CRNA  Anesthesia Plan Comments:        Anesthesia Quick Evaluation

## 2020-02-16 ENCOUNTER — Other Ambulatory Visit: Payer: Self-pay

## 2020-02-16 ENCOUNTER — Ambulatory Visit (HOSPITAL_COMMUNITY): Payer: Medicare Other | Admitting: Physician Assistant

## 2020-02-16 ENCOUNTER — Encounter (HOSPITAL_COMMUNITY): Payer: Self-pay | Admitting: Vascular Surgery

## 2020-02-16 ENCOUNTER — Encounter (HOSPITAL_COMMUNITY)
Admission: RE | Disposition: A | Discharge status: Home / Self Care | Payer: Self-pay | Source: Home / Self Care | Attending: Vascular Surgery

## 2020-02-16 ENCOUNTER — Ambulatory Visit (HOSPITAL_COMMUNITY)
Admission: RE | Admit: 2020-02-16 | Discharge: 2020-02-16 | Disposition: A | Discharge status: Home / Self Care | Payer: Medicare Other | Attending: Vascular Surgery | Admitting: Vascular Surgery

## 2020-02-16 DIAGNOSIS — K219 Gastro-esophageal reflux disease without esophagitis: Secondary | ICD-10-CM | POA: Insufficient documentation

## 2020-02-16 DIAGNOSIS — I120 Hypertensive chronic kidney disease with stage 5 chronic kidney disease or end stage renal disease: Secondary | ICD-10-CM | POA: Diagnosis not present

## 2020-02-16 DIAGNOSIS — X58XXXA Exposure to other specified factors, initial encounter: Secondary | ICD-10-CM | POA: Insufficient documentation

## 2020-02-16 DIAGNOSIS — T82868A Thrombosis of vascular prosthetic devices, implants and grafts, initial encounter: Secondary | ICD-10-CM | POA: Insufficient documentation

## 2020-02-16 DIAGNOSIS — Z992 Dependence on renal dialysis: Secondary | ICD-10-CM | POA: Diagnosis not present

## 2020-02-16 DIAGNOSIS — Z8616 Personal history of COVID-19: Secondary | ICD-10-CM | POA: Diagnosis not present

## 2020-02-16 DIAGNOSIS — E1122 Type 2 diabetes mellitus with diabetic chronic kidney disease: Secondary | ICD-10-CM | POA: Diagnosis not present

## 2020-02-16 DIAGNOSIS — J9690 Respiratory failure, unspecified, unspecified whether with hypoxia or hypercapnia: Secondary | ICD-10-CM | POA: Diagnosis not present

## 2020-02-16 DIAGNOSIS — D631 Anemia in chronic kidney disease: Secondary | ICD-10-CM | POA: Insufficient documentation

## 2020-02-16 DIAGNOSIS — Z794 Long term (current) use of insulin: Secondary | ICD-10-CM | POA: Insufficient documentation

## 2020-02-16 DIAGNOSIS — Z79899 Other long term (current) drug therapy: Secondary | ICD-10-CM | POA: Insufficient documentation

## 2020-02-16 DIAGNOSIS — N186 End stage renal disease: Secondary | ICD-10-CM | POA: Diagnosis not present

## 2020-02-16 HISTORY — PX: BASCILIC VEIN TRANSPOSITION: SHX5742

## 2020-02-16 LAB — POCT I-STAT, CHEM 8
BUN: 50 mg/dL — ABNORMAL HIGH (ref 6–20)
Calcium, Ion: 1.1 mmol/L — ABNORMAL LOW (ref 1.15–1.40)
Chloride: 95 mmol/L — ABNORMAL LOW (ref 98–111)
Creatinine, Ser: 13.9 mg/dL — ABNORMAL HIGH (ref 0.61–1.24)
Glucose, Bld: 104 mg/dL — ABNORMAL HIGH (ref 70–99)
HCT: 28 % — ABNORMAL LOW (ref 39.0–52.0)
Hemoglobin: 9.5 g/dL — ABNORMAL LOW (ref 13.0–17.0)
Potassium: 4.4 mmol/L (ref 3.5–5.1)
Sodium: 138 mmol/L (ref 135–145)
TCO2: 32 mmol/L (ref 22–32)

## 2020-02-16 LAB — GLUCOSE, CAPILLARY
Glucose-Capillary: 82 mg/dL (ref 70–99)
Glucose-Capillary: 94 mg/dL (ref 70–99)

## 2020-02-16 SURGERY — TRANSPOSITION, VEIN, BASILIC
Anesthesia: General | Site: Arm Upper | Laterality: Left

## 2020-02-16 MED ORDER — CHLORHEXIDINE GLUCONATE 0.12 % MT SOLN: 15.0000 mL | Freq: Once | OROMUCOSAL | Status: AC

## 2020-02-16 MED ORDER — GLYCOPYRROLATE 0.2 MG/ML IJ SOLN
INTRAMUSCULAR | Status: DC | PRN
Start: 2020-02-16 — End: 2020-02-16
  Administered 2020-02-16: .1 mg via INTRAVENOUS

## 2020-02-16 MED ORDER — FENTANYL CITRATE (PF) 250 MCG/5ML IJ SOLN
INTRAMUSCULAR | Status: AC
  Filled 2020-02-16: qty 5

## 2020-02-16 MED ORDER — ORAL CARE MOUTH RINSE: 15.0000 mL | Freq: Once | OROMUCOSAL | Status: AC

## 2020-02-16 MED ORDER — ONDANSETRON HCL 4 MG/2ML IJ SOLN
INTRAMUSCULAR | Status: DC | PRN
  Administered 2020-02-16: 4 mg via INTRAVENOUS

## 2020-02-16 MED ORDER — LIDOCAINE-EPINEPHRINE (PF) 1 %-1:200000 IJ SOLN
INTRAMUSCULAR | Status: DC | PRN
  Administered 2020-02-16: 10 mL

## 2020-02-16 MED ORDER — DEXAMETHASONE SODIUM PHOSPHATE 10 MG/ML IJ SOLN
INTRAMUSCULAR | Status: DC | PRN
  Administered 2020-02-16: 5 mg via INTRAVENOUS

## 2020-02-16 MED ORDER — CEFAZOLIN SODIUM-DEXTROSE 2-4 GM/100ML-% IV SOLN
2.0000 g | INTRAVENOUS | Status: AC
  Administered 2020-02-16: 2 g via INTRAVENOUS

## 2020-02-16 MED ORDER — PROPOFOL 10 MG/ML IV BOLUS
INTRAVENOUS | Status: DC | PRN
  Administered 2020-02-16: 150 mg via INTRAVENOUS

## 2020-02-16 MED ORDER — LIDOCAINE HCL (CARDIAC) PF 100 MG/5ML IV SOSY
PREFILLED_SYRINGE | INTRAVENOUS | Status: DC | PRN
  Administered 2020-02-16: 100 mg via INTRAVENOUS

## 2020-02-16 MED ORDER — OXYCODONE-ACETAMINOPHEN 7.5-325 MG PO TABS: 1.0000 | ORAL_TABLET | ORAL | 0 refills | Status: DC | PRN

## 2020-02-16 MED ORDER — PHENYLEPHRINE HCL (PRESSORS) 10 MG/ML IV SOLN
INTRAVENOUS | Status: DC | PRN
  Administered 2020-02-16 (×2): 80 ug via INTRAVENOUS
  Administered 2020-02-16: 40 ug via INTRAVENOUS
  Administered 2020-02-16: 80 ug via INTRAVENOUS
  Administered 2020-02-16: 40 ug via INTRAVENOUS
  Administered 2020-02-16: 80 ug via INTRAVENOUS

## 2020-02-16 MED ORDER — CEFAZOLIN SODIUM-DEXTROSE 2-4 GM/100ML-% IV SOLN
INTRAVENOUS | Status: AC
  Filled 2020-02-16: qty 100

## 2020-02-16 MED ORDER — PHENYLEPHRINE 40 MCG/ML (10ML) SYRINGE FOR IV PUSH (FOR BLOOD PRESSURE SUPPORT)
PREFILLED_SYRINGE | INTRAVENOUS | Status: AC
  Filled 2020-02-16: qty 10

## 2020-02-16 MED ORDER — EPHEDRINE SULFATE 50 MG/ML IJ SOLN
INTRAMUSCULAR | Status: DC | PRN
  Administered 2020-02-16: 5 mg via INTRAVENOUS
  Administered 2020-02-16 (×2): 10 mg via INTRAVENOUS

## 2020-02-16 MED ORDER — CHLORHEXIDINE GLUCONATE 0.12 % MT SOLN
OROMUCOSAL | Status: AC
  Administered 2020-02-16: 15 mL via OROMUCOSAL
  Filled 2020-02-16: qty 15

## 2020-02-16 MED ORDER — 0.9 % SODIUM CHLORIDE (POUR BTL) OPTIME
TOPICAL | Status: DC | PRN
  Administered 2020-02-16: 1000 mL

## 2020-02-16 MED ORDER — ACETAMINOPHEN 500 MG PO TABS: 1000.0000 mg | ORAL_TABLET | Freq: Once | ORAL | Status: AC

## 2020-02-16 MED ORDER — LIDOCAINE 2% (20 MG/ML) 5 ML SYRINGE
INTRAMUSCULAR | Status: AC
  Filled 2020-02-16: qty 5

## 2020-02-16 MED ORDER — CHLORHEXIDINE GLUCONATE 4 % EX LIQD: 60.0000 mL | Freq: Once | CUTANEOUS | Status: DC

## 2020-02-16 MED ORDER — FENTANYL CITRATE (PF) 250 MCG/5ML IJ SOLN
INTRAMUSCULAR | Status: DC | PRN
  Administered 2020-02-16 (×2): 25 ug via INTRAVENOUS

## 2020-02-16 MED ORDER — PAPAVERINE HCL 30 MG/ML IJ SOLN
INTRAMUSCULAR | Status: AC
  Filled 2020-02-16: qty 2

## 2020-02-16 MED ORDER — EPHEDRINE 5 MG/ML INJ
INTRAVENOUS | Status: AC
  Filled 2020-02-16: qty 10

## 2020-02-16 MED ORDER — LIDOCAINE-EPINEPHRINE (PF) 1 %-1:200000 IJ SOLN
INTRAMUSCULAR | Status: AC
  Filled 2020-02-16: qty 30

## 2020-02-16 MED ORDER — SODIUM CHLORIDE 0.9 % IV SOLN
INTRAVENOUS | Status: AC
  Filled 2020-02-16: qty 1.2

## 2020-02-16 MED ORDER — PROPOFOL 10 MG/ML IV BOLUS
INTRAVENOUS | Status: AC
  Filled 2020-02-16: qty 20

## 2020-02-16 MED ORDER — HEPARIN SODIUM (PORCINE) 1000 UNIT/ML IJ SOLN
INTRAMUSCULAR | Status: AC
  Filled 2020-02-16: qty 1

## 2020-02-16 MED ORDER — MIDAZOLAM HCL 5 MG/5ML IJ SOLN
INTRAMUSCULAR | Status: DC | PRN
  Administered 2020-02-16: 2 mg via INTRAVENOUS

## 2020-02-16 MED ORDER — GLYCOPYRROLATE PF 0.2 MG/ML IJ SOSY
PREFILLED_SYRINGE | INTRAMUSCULAR | Status: AC
  Filled 2020-02-16: qty 1

## 2020-02-16 MED ORDER — ONDANSETRON HCL 4 MG/2ML IJ SOLN
INTRAMUSCULAR | Status: AC
  Filled 2020-02-16: qty 2

## 2020-02-16 MED ORDER — PROTAMINE SULFATE 10 MG/ML IV SOLN
INTRAVENOUS | Status: AC
  Filled 2020-02-16: qty 5

## 2020-02-16 MED ORDER — SODIUM CHLORIDE 0.9 % IV SOLN
INTRAVENOUS | Status: DC | PRN
  Administered 2020-02-16: 500 mL

## 2020-02-16 MED ORDER — MIDAZOLAM HCL 2 MG/2ML IJ SOLN
INTRAMUSCULAR | Status: AC
  Filled 2020-02-16: qty 2

## 2020-02-16 MED ORDER — SODIUM CHLORIDE 0.9 % IV SOLN: INTRAVENOUS | Status: DC

## 2020-02-16 MED ORDER — ACETAMINOPHEN 500 MG PO TABS
ORAL_TABLET | ORAL | Status: AC
  Administered 2020-02-16: 1000 mg via ORAL
  Filled 2020-02-16: qty 2

## 2020-02-16 SURGICAL SUPPLY — 37 items
ARMBAND PINK RESTRICT EXTREMIT (MISCELLANEOUS) ×2 IMPLANT
CANISTER SUCT 3000ML PPV (MISCELLANEOUS) ×2 IMPLANT
CLIP VESOCCLUDE MED 24/CT (CLIP) IMPLANT
CLIP VESOCCLUDE MED 6/CT (CLIP) ×2 IMPLANT
CLIP VESOCCLUDE SM WIDE 24/CT (CLIP) IMPLANT
CLIP VESOCCLUDE SM WIDE 6/CT (CLIP) ×4 IMPLANT
COVER PROBE W GEL 5X96 (DRAPES) IMPLANT
COVER WAND RF STERILE (DRAPES) IMPLANT
DECANTER SPIKE VIAL GLASS SM (MISCELLANEOUS) ×2 IMPLANT
DERMABOND ADVANCED (GAUZE/BANDAGES/DRESSINGS) ×1
DERMABOND ADVANCED .7 DNX12 (GAUZE/BANDAGES/DRESSINGS) ×1 IMPLANT
ELECT REM PT RETURN 9FT ADLT (ELECTROSURGICAL) ×2
ELECTRODE REM PT RTRN 9FT ADLT (ELECTROSURGICAL) ×1 IMPLANT
GLOVE BIO SURGEON STRL SZ7.5 (GLOVE) ×2 IMPLANT
GLOVE BIOGEL PI IND STRL 6.5 (GLOVE) ×1 IMPLANT
GLOVE BIOGEL PI IND STRL 8 (GLOVE) ×1 IMPLANT
GLOVE BIOGEL PI INDICATOR 6.5 (GLOVE) ×1
GLOVE BIOGEL PI INDICATOR 8 (GLOVE) ×1
GLOVE SURG SS PI 6.5 STRL IVOR (GLOVE) ×2 IMPLANT
GOWN STRL REUS W/ TWL LRG LVL3 (GOWN DISPOSABLE) ×2 IMPLANT
GOWN STRL REUS W/ TWL XL LVL3 (GOWN DISPOSABLE) ×2 IMPLANT
GOWN STRL REUS W/TWL LRG LVL3 (GOWN DISPOSABLE) ×4
GOWN STRL REUS W/TWL XL LVL3 (GOWN DISPOSABLE) ×4
KIT BASIN OR (CUSTOM PROCEDURE TRAY) ×2 IMPLANT
KIT TURNOVER KIT B (KITS) ×2 IMPLANT
NS IRRIG 1000ML POUR BTL (IV SOLUTION) ×2 IMPLANT
PACK CV ACCESS (CUSTOM PROCEDURE TRAY) ×2 IMPLANT
PAD ARMBOARD 7.5X6 YLW CONV (MISCELLANEOUS) ×4 IMPLANT
SPONGE LAP 18X18 X RAY DECT (DISPOSABLE) ×2 IMPLANT
SUT MNCRL AB 4-0 PS2 18 (SUTURE) ×8 IMPLANT
SUT PROLENE 6 0 BV (SUTURE) ×4 IMPLANT
SUT SILK 2 0 SH (SUTURE) ×2 IMPLANT
SUT VIC AB 3-0 SH 27 (SUTURE) ×6
SUT VIC AB 3-0 SH 27X BRD (SUTURE) ×3 IMPLANT
TOWEL GREEN STERILE (TOWEL DISPOSABLE) ×2 IMPLANT
UNDERPAD 30X36 HEAVY ABSORB (UNDERPADS AND DIAPERS) ×2 IMPLANT
WATER STERILE IRR 1000ML POUR (IV SOLUTION) ×2 IMPLANT

## 2020-02-16 NOTE — Anesthesia Postprocedure Evaluation (Signed)
Anesthesia Post Note  Patient: Patrick Ibarra  Procedure(s) Performed: SECOND STAGE LEFT BASCILIC VEIN TRANSPOSITION (Left Arm Upper)     Patient location during evaluation: PACU Anesthesia Type: General Level of consciousness: awake and alert Pain management: pain level controlled Vital Signs Assessment: post-procedure vital signs reviewed and stable Respiratory status: spontaneous breathing, nonlabored ventilation and respiratory function stable Cardiovascular status: blood pressure returned to baseline and stable Postop Assessment: no apparent nausea or vomiting Anesthetic complications: no   No complications documented.  Last Vitals:  Vitals:   02/16/20 1015 02/16/20 1030  BP: (!) 140/96 (!) 140/95  Pulse: 85 83  Resp: 13 13  Temp:  36.4 C  SpO2: 98% 98%    Last Pain:  Vitals:   02/16/20 1030  TempSrc:   PainSc: 0-No pain                 Catalina Gravel

## 2020-02-16 NOTE — Op Note (Signed)
    Patient name: Patrick Ibarra MRN: [id number] DOB: 03/15/1981 Sex: male  02/16/2020 Pre-operative Diagnosis: esrd Post-operative diagnosis:  Same Surgeon:  Erlene Quan C. Donzetta Matters, MD Assistant: Risa Grill, PA Procedure Performed:  Revision of left arm basilic vein fistula with transposition  Indications: 39 year old male with end-stage renal disease previously had a fistula in his left arm that was ligated.  He now dialyzes via catheter.  He has had a for stage brachial artery to basilic vein fistula created which has adequate flows he is now indicated for transposition.  Findings: Fistula was very large throughout its course measuring up to 1-1/2 cm.  After transposition fistula is very strong flow and there is a radial artery pulse at the wrist confirmed with Doppler.   Procedure:  The patient was identified in the holding area and taken to the operating room where he was placed supine on the operating table and LMA anesthesia was induced.  He was sterilely prepped draped in left upper extremity usual fashion antibiotics were administered a timeout was called.  We opened his previous incision dissected down to the fistula.  2 skip incisions were made up to the axilla.  The nerve was protected throughout its course.  We divided branches between clips and ties.  When the entire fistula was mobilized we marked it for orientation.  We clamped it near the antecubitum and divided.  We flushed with heparinized saline clamped it and tunneled it laterally on the arm.  We then flushed again and reclamped.  We did remove a valve from near the antecubitum.  We had very strong inflow.  We then sewed and and with 6-0 Prolene suture.  Upon completion there was a very strong thrill.  There was a palpable radial artery pulse the wrist confirmed with Doppler.  Satisfied we obtain hemostasis irrigated the wounds and closed in layers of Vicryl and Monocryl.  Dermabond was placed at the level of skin.  He was awakened from  anesthesia having tolerated procedure without immediate complication.  All counts were correct at completion.  EBL: 25 cc   Cesiah Westley C. Donzetta Matters, MD [facility] [place] Office: [phone] Pager: [phone]

## 2020-02-16 NOTE — Transfer of Care (Signed)
Immediate Anesthesia Transfer of Care Note  Patient: QUANTAVIUS HUMM  Procedure(s) Performed: SECOND STAGE LEFT BASCILIC VEIN TRANSPOSITION (Left Arm Upper)  Patient Location: PACU  Anesthesia Type:General  Level of Consciousness: drowsy, patient cooperative and responds to stimulation  Airway & Oxygen Therapy: Patient Spontanous Breathing and Patient connected to face mask oxygen  Post-op Assessment: Report given to RN and Post -op Vital signs reviewed and stable  Post vital signs: Reviewed and stable  Last Vitals:  Vitals Value Taken Time  BP    Temp    Pulse 90 02/16/20 0945  Resp 10 02/16/20 0945  SpO2 100 % 02/16/20 0945  Vitals shown include unvalidated device data.  Last Pain:  Vitals:   02/16/20 0702  TempSrc: Oral  PainSc:          Complications: No complications documented.

## 2020-02-16 NOTE — Discharge Instructions (Signed)
° °  [facility] [place] ° °Discharge Instructions ° °AV Fistula or Graft Surgery for Dialysis Access ° °Please refer to the following instructions for your post-procedure care. Your surgeon or physician assistant will discuss any changes with you. ° °Activity ° °You may drive the day following your surgery, if you are comfortable and no longer taking prescription pain medication. Resume full activity as the soreness in your incision resolves. ° °Bathing/Showering ° °You may shower after you go home. Keep your incision dry for 48 hours. Do not soak in a bathtub, hot tub, or swim until the incision heals completely. You may not shower if you have a hemodialysis catheter. ° °Incision Care ° °Clean your incision with mild soap and water after 48 hours. Pat the area dry with a clean towel. You do not need a bandage unless otherwise instructed. Do not apply any ointments or creams to your incision. You may have skin glue on your incision. Do not peel it off. It will come off on its own in about one week. Your arm may swell a bit after surgery. To reduce swelling use pillows to elevate your arm so it is above your heart. Your doctor will tell you if you need to lightly wrap your arm with an ACE bandage. ° °Diet ° °Resume your normal diet. There are not special food restrictions following this procedure. In order to heal from your surgery, it is CRITICAL to get adequate nutrition. Your body requires vitamins, minerals, and protein. Vegetables are the best source of vitamins and minerals. Vegetables also provide the perfect balance of protein. Processed food has little nutritional value, so try to avoid this. ° °Medications ° °Resume taking all of your medications. If your incision is causing pain, you may take over-the counter pain relievers such as acetaminophen (Tylenol). If you were prescribed a stronger pain medication, please be aware these medications can cause nausea and constipation. Prevent  nausea by taking the medication with a snack or meal. Avoid constipation by drinking plenty of fluids and eating foods with high amount of fiber, such as fruits, vegetables, and grains. Do not take Tylenol if you are taking prescription pain medications. ° ° ° ° °Follow up °Your surgeon may want to see you in the office following your access surgery. If so, this will be arranged at the time of your surgery. ° °Please call us immediately for any of the following conditions: ° °Increased pain, redness, drainage (pus) from your incision site °Fever of 101 degrees or higher °Severe or worsening pain at your incision site °Hand pain or numbness. ° °Reduce your risk of vascular disease: ° °Stop smoking. If you would like help, call [facility] at 1-800-QUIT-NOW (1-800-784-8669) or [place] at 336-586-4000 ° °Manage your cholesterol °Maintain a desired weight °Control your diabetes °Keep your blood pressure down ° °Dialysis ° °It will take several weeks to several months for your new dialysis access to be ready for use. Your surgeon will determine when it is OK to use it. Your nephrologist will continue to direct your dialysis. You can continue to use your Permcath until your new access is ready for use. ° °If you have any questions, please call the office at [phone]. ° °

## 2020-02-16 NOTE — H&P (Signed)
   History and Physical Update  The patient was interviewed and re-examined.  [name] previous History and Physical has been reviewed and is unchanged from recent office visit. He has a very strong thrill in the left arm. Plan for left arm 2nd stage bvt.  Jasa Dundon C. Donzetta Matters, MD [facility] [place] Office: [phone] Pager: [phone]   02/16/2020, 8:18 AM

## 2020-02-16 NOTE — Anesthesia Procedure Notes (Signed)
Procedure Name: LMA Insertion Date/Time: 02/16/2020 8:38 AM Performed by: Michele Rockers, CRNA Pre-anesthesia Checklist: Patient identified, Emergency Drugs available, Suction available and Patient being monitored Patient Re-evaluated:Patient Re-evaluated prior to induction Oxygen Delivery Method: Circle system utilized Preoxygenation: Pre-oxygenation with 100% oxygen Induction Type: IV induction Ventilation: Mask ventilation without difficulty LMA Size: 5.0 Number of attempts: 1 Airway Equipment and Method: Oral airway Placement Confirmation: positive ETCO2 and breath sounds checked- equal and bilateral Tube secured with: Tape Dental Injury: Teeth and Oropharynx as per pre-operative assessment

## 2020-02-17 ENCOUNTER — Encounter (HOSPITAL_COMMUNITY): Payer: Self-pay | Admitting: Vascular Surgery

## 2020-03-02 DIAGNOSIS — Z992 Dependence on renal dialysis: Secondary | ICD-10-CM | POA: Diagnosis not present

## 2020-03-02 DIAGNOSIS — D509 Iron deficiency anemia, unspecified: Secondary | ICD-10-CM | POA: Diagnosis not present

## 2020-03-02 DIAGNOSIS — D631 Anemia in chronic kidney disease: Secondary | ICD-10-CM | POA: Diagnosis not present

## 2020-03-02 DIAGNOSIS — N186 End stage renal disease: Secondary | ICD-10-CM | POA: Diagnosis not present

## 2020-03-02 DIAGNOSIS — N2581 Secondary hyperparathyroidism of renal origin: Secondary | ICD-10-CM | POA: Diagnosis not present

## 2020-03-02 DIAGNOSIS — I129 Hypertensive chronic kidney disease with stage 1 through stage 4 chronic kidney disease, or unspecified chronic kidney disease: Secondary | ICD-10-CM | POA: Diagnosis not present

## 2020-03-07 DIAGNOSIS — D631 Anemia in chronic kidney disease: Secondary | ICD-10-CM | POA: Diagnosis not present

## 2020-03-07 DIAGNOSIS — D509 Iron deficiency anemia, unspecified: Secondary | ICD-10-CM | POA: Diagnosis not present

## 2020-03-07 DIAGNOSIS — Z992 Dependence on renal dialysis: Secondary | ICD-10-CM | POA: Diagnosis not present

## 2020-03-07 DIAGNOSIS — N186 End stage renal disease: Secondary | ICD-10-CM | POA: Diagnosis not present

## 2020-03-07 DIAGNOSIS — N2581 Secondary hyperparathyroidism of renal origin: Secondary | ICD-10-CM | POA: Diagnosis not present

## 2020-03-09 DIAGNOSIS — N186 End stage renal disease: Secondary | ICD-10-CM | POA: Diagnosis not present

## 2020-03-09 DIAGNOSIS — Z992 Dependence on renal dialysis: Secondary | ICD-10-CM | POA: Diagnosis not present

## 2020-03-09 DIAGNOSIS — D631 Anemia in chronic kidney disease: Secondary | ICD-10-CM | POA: Diagnosis not present

## 2020-03-09 DIAGNOSIS — D509 Iron deficiency anemia, unspecified: Secondary | ICD-10-CM | POA: Diagnosis not present

## 2020-03-09 DIAGNOSIS — N2581 Secondary hyperparathyroidism of renal origin: Secondary | ICD-10-CM | POA: Diagnosis not present

## 2020-03-11 DIAGNOSIS — D509 Iron deficiency anemia, unspecified: Secondary | ICD-10-CM | POA: Diagnosis not present

## 2020-03-11 DIAGNOSIS — N2581 Secondary hyperparathyroidism of renal origin: Secondary | ICD-10-CM | POA: Diagnosis not present

## 2020-03-11 DIAGNOSIS — N186 End stage renal disease: Secondary | ICD-10-CM | POA: Diagnosis not present

## 2020-03-11 DIAGNOSIS — Z992 Dependence on renal dialysis: Secondary | ICD-10-CM | POA: Diagnosis not present

## 2020-03-11 DIAGNOSIS — D631 Anemia in chronic kidney disease: Secondary | ICD-10-CM | POA: Diagnosis not present

## 2020-03-14 DIAGNOSIS — Z992 Dependence on renal dialysis: Secondary | ICD-10-CM | POA: Diagnosis not present

## 2020-03-14 DIAGNOSIS — N2581 Secondary hyperparathyroidism of renal origin: Secondary | ICD-10-CM | POA: Diagnosis not present

## 2020-03-14 DIAGNOSIS — N186 End stage renal disease: Secondary | ICD-10-CM | POA: Diagnosis not present

## 2020-03-14 DIAGNOSIS — D509 Iron deficiency anemia, unspecified: Secondary | ICD-10-CM | POA: Diagnosis not present

## 2020-03-14 DIAGNOSIS — D631 Anemia in chronic kidney disease: Secondary | ICD-10-CM | POA: Diagnosis not present

## 2020-03-16 DIAGNOSIS — Z992 Dependence on renal dialysis: Secondary | ICD-10-CM | POA: Diagnosis not present

## 2020-03-16 DIAGNOSIS — N186 End stage renal disease: Secondary | ICD-10-CM | POA: Diagnosis not present

## 2020-03-16 DIAGNOSIS — E1129 Type 2 diabetes mellitus with other diabetic kidney complication: Secondary | ICD-10-CM | POA: Diagnosis not present

## 2020-03-16 DIAGNOSIS — D631 Anemia in chronic kidney disease: Secondary | ICD-10-CM | POA: Diagnosis not present

## 2020-03-16 DIAGNOSIS — N2581 Secondary hyperparathyroidism of renal origin: Secondary | ICD-10-CM | POA: Diagnosis not present

## 2020-03-16 DIAGNOSIS — D509 Iron deficiency anemia, unspecified: Secondary | ICD-10-CM | POA: Diagnosis not present

## 2020-03-21 DIAGNOSIS — N186 End stage renal disease: Secondary | ICD-10-CM | POA: Diagnosis not present

## 2020-03-21 DIAGNOSIS — N2581 Secondary hyperparathyroidism of renal origin: Secondary | ICD-10-CM | POA: Diagnosis not present

## 2020-03-21 DIAGNOSIS — D509 Iron deficiency anemia, unspecified: Secondary | ICD-10-CM | POA: Diagnosis not present

## 2020-03-21 DIAGNOSIS — D631 Anemia in chronic kidney disease: Secondary | ICD-10-CM | POA: Diagnosis not present

## 2020-03-21 DIAGNOSIS — Z992 Dependence on renal dialysis: Secondary | ICD-10-CM | POA: Diagnosis not present

## 2020-03-23 DIAGNOSIS — N2581 Secondary hyperparathyroidism of renal origin: Secondary | ICD-10-CM | POA: Diagnosis not present

## 2020-03-23 DIAGNOSIS — Z992 Dependence on renal dialysis: Secondary | ICD-10-CM | POA: Diagnosis not present

## 2020-03-23 DIAGNOSIS — D631 Anemia in chronic kidney disease: Secondary | ICD-10-CM | POA: Diagnosis not present

## 2020-03-23 DIAGNOSIS — N186 End stage renal disease: Secondary | ICD-10-CM | POA: Diagnosis not present

## 2020-03-23 DIAGNOSIS — D509 Iron deficiency anemia, unspecified: Secondary | ICD-10-CM | POA: Diagnosis not present

## 2020-03-27 ENCOUNTER — Other Ambulatory Visit: Payer: Self-pay

## 2020-03-27 ENCOUNTER — Ambulatory Visit (INDEPENDENT_AMBULATORY_CARE_PROVIDER_SITE_OTHER): Payer: Self-pay | Admitting: Physician Assistant

## 2020-03-27 VITALS — BP 181/103 | HR 94 | Temp 97.0°F | Ht 73.0 in | Wt 234.1 lb

## 2020-03-27 DIAGNOSIS — N186 End stage renal disease: Secondary | ICD-10-CM

## 2020-03-27 NOTE — Progress Notes (Signed)
POST OPERATIVE OFFICE NOTE    CC:  F/u for surgery  HPI:  This is a 39 y.o. male who is s/p revision of left arm basilic vein fistula with transposition on 02/16/2020 by Dr. Donzetta Matters.   Pt had TDC placed by IR on 12/06/2019.   Intraoperatively, the fistula was found to be very large throughout its course measuring up to 1-1.5cm.  After transposition fistula is very strong flow and there is a radial artery pulse at the wrist confirmed with doppler.     Pt states he does not have pain/numbness in left hand.  He does have some numbness in his forearm.  He states he feels like it falls asleep and he has to move it to get it better.   The pt is on dialysis T/T/S at the [facility] location.  No Known Allergies  Current Outpatient Medications  Medication Sig Dispense Refill  . Accu-Chek FastClix Lancets MISC Use as instructed to check blood sugar once daily. E11.9 102 each 11  . amLODipine (NORVASC) 10 MG tablet Take 1 tablet (10 mg total) by mouth daily. 30 tablet 5  . Blood Glucose Monitoring Suppl (ACCU-CHEK GUIDE ME) w/Device KIT 1 kit by Does not apply route daily. Use as instructed to check blood sugar once daily. E11.9 1 kit 0  . carvedilol (COREG) 12.5 MG tablet Take 1 tablet (12.5 mg total) by mouth 2 (two) times daily with a meal. 60 tablet 4  . glucose blood (ACCU-CHEK GUIDE) test strip Use as instructed to check blood sugar once daily. E11.9 100 each 12  . insulin aspart (NOVOLOG FLEXPEN) 100 UNIT/ML FlexPen 2 units subcut with meals for BS greater than 150. (Patient taking differently: Inject 2 Units into the skin daily as needed for high blood sugar. Inject 2 units into the skin three times a day with meals for a BGL greater than 150) 15 mL 2  . Insulin Pen Needle (PEN NEEDLES) 31G X 8 MM MISC UAD 100 each 6  . lovastatin (MEVACOR) 20 MG tablet TAKE 1 TABLET (20 MG TOTAL) BY MOUTH AT BEDTIME. (Patient taking differently: Take 20 mg by mouth daily. ) 30 tablet 2  . multivitamin (RENA-VIT) TABS  tablet Place 1 tablet into feeding tube at bedtime. (Patient not taking: Reported on 02/11/2020) 30 tablet 0  . oxyCODONE-acetaminophen (PERCOCET) 7.5-325 MG tablet Take 1 tablet by mouth every 4 (four) hours as needed for severe pain. 20 tablet 0  . pantoprazole (PROTONIX) 40 MG tablet Take 1 tablet (40 mg total) by mouth at bedtime. (Patient not taking: Reported on 02/11/2020) 30 tablet 0   No current facility-administered medications for this visit.     ROS:  See HPI  Physical Exam:  Today's Vitals   03/27/20 1533  BP: (!) 181/103  Pulse: 94  Temp: (!) 97 F (36.1 C)  TempSrc: Temporal  SpO2: 99%  Weight: (!) 234 lb 1.6 oz (106.2 kg)  Height: '6\' 1"'  (1.854 m)   Body mass index is 30.89 kg/m.   Incision:  All incisions are healed Extremities:  There is a palpable left radial pulse.  Motor and sensory are in tact.  There is a thrill/bruit present.      Assessment/Plan:  This is a 39 y.o. male who is s/p: revision of left arm basilic vein fistula with transposition on 02/16/2020 by Dr. Donzetta Matters.   Pt had TDC placed by IR on 12/06/2019.    -the pt does not have evidence of steal.  He  does have some numbness of the left forearm, most likely due to nerve irritation. -the fistula can be used 05/18/2020. -If pt has a tunneled dialysis catheter and the access has been used successfully to the satisfaction of the dialysis center, the tunneled catheter can be scheduled with IR to removed at their discretion.   -the pt will follow up as needed   Leontine Locket, [facility]  [facility] [phone]  Clinic MD:  Oneida Alar on call MD

## 2020-03-28 ENCOUNTER — Other Ambulatory Visit: Payer: Self-pay | Admitting: Internal Medicine

## 2020-03-28 DIAGNOSIS — D631 Anemia in chronic kidney disease: Secondary | ICD-10-CM | POA: Diagnosis not present

## 2020-03-28 DIAGNOSIS — D509 Iron deficiency anemia, unspecified: Secondary | ICD-10-CM | POA: Diagnosis not present

## 2020-03-28 DIAGNOSIS — N186 End stage renal disease: Secondary | ICD-10-CM | POA: Diagnosis not present

## 2020-03-28 DIAGNOSIS — I10 Essential (primary) hypertension: Secondary | ICD-10-CM

## 2020-03-28 DIAGNOSIS — N2581 Secondary hyperparathyroidism of renal origin: Secondary | ICD-10-CM | POA: Diagnosis not present

## 2020-03-28 DIAGNOSIS — Z992 Dependence on renal dialysis: Secondary | ICD-10-CM | POA: Diagnosis not present

## 2020-03-28 MED FILL — AMLODIPINE BESYLATE 10 MG T: 10 | 30 days supply | Qty: 30 | Fill #0

## 2020-03-28 MED FILL — LOVASTATIN 20 MG TABS: 20 | 30 days supply | Qty: 30 | Fill #1

## 2020-03-28 MED FILL — CARVEDILOL 12.5 MG TABLET: 12.5 | 30 days supply | Qty: 60 | Fill #2

## 2020-03-30 DIAGNOSIS — D631 Anemia in chronic kidney disease: Secondary | ICD-10-CM | POA: Diagnosis not present

## 2020-03-30 DIAGNOSIS — N186 End stage renal disease: Secondary | ICD-10-CM | POA: Diagnosis not present

## 2020-03-30 DIAGNOSIS — N2581 Secondary hyperparathyroidism of renal origin: Secondary | ICD-10-CM | POA: Diagnosis not present

## 2020-03-30 DIAGNOSIS — Z992 Dependence on renal dialysis: Secondary | ICD-10-CM | POA: Diagnosis not present

## 2020-03-30 DIAGNOSIS — D509 Iron deficiency anemia, unspecified: Secondary | ICD-10-CM | POA: Diagnosis not present

## 2020-04-01 DIAGNOSIS — Z992 Dependence on renal dialysis: Secondary | ICD-10-CM | POA: Diagnosis not present

## 2020-04-01 DIAGNOSIS — D631 Anemia in chronic kidney disease: Secondary | ICD-10-CM | POA: Diagnosis not present

## 2020-04-01 DIAGNOSIS — N186 End stage renal disease: Secondary | ICD-10-CM | POA: Diagnosis not present

## 2020-04-01 DIAGNOSIS — D509 Iron deficiency anemia, unspecified: Secondary | ICD-10-CM | POA: Diagnosis not present

## 2020-04-01 DIAGNOSIS — N2581 Secondary hyperparathyroidism of renal origin: Secondary | ICD-10-CM | POA: Diagnosis not present

## 2020-04-02 DIAGNOSIS — I129 Hypertensive chronic kidney disease with stage 1 through stage 4 chronic kidney disease, or unspecified chronic kidney disease: Secondary | ICD-10-CM | POA: Diagnosis not present

## 2020-04-02 DIAGNOSIS — N186 End stage renal disease: Secondary | ICD-10-CM | POA: Diagnosis not present

## 2020-04-02 DIAGNOSIS — Z992 Dependence on renal dialysis: Secondary | ICD-10-CM | POA: Diagnosis not present

## 2020-04-08 DIAGNOSIS — N2581 Secondary hyperparathyroidism of renal origin: Secondary | ICD-10-CM | POA: Diagnosis not present

## 2020-04-08 DIAGNOSIS — N186 End stage renal disease: Secondary | ICD-10-CM | POA: Diagnosis not present

## 2020-04-08 DIAGNOSIS — D509 Iron deficiency anemia, unspecified: Secondary | ICD-10-CM | POA: Diagnosis not present

## 2020-04-08 DIAGNOSIS — Z992 Dependence on renal dialysis: Secondary | ICD-10-CM | POA: Diagnosis not present

## 2020-04-10 ENCOUNTER — Telehealth: Payer: Self-pay

## 2020-04-10 NOTE — Telephone Encounter (Addendum)
Levada Dy made aware.  Tanelle Lanzo.   ----- Message from Elam Dutch, MD sent at 04/10/2020 10:41 AM EDT ----- Regarding: RE: Cannulation date change Contact: [phone]  [place] let them stick it  ----- Message ----- From: Kaleen Mask, LPN Sent: 12/31/8341  73:57 AM EDT To: Elam Dutch, MD, Gabriel Earing, PA-C Subject: Cannulation date change                        Good morning.  Levada Dy, Program Mgr with [employer] on [address] just called about this patient.  She says we want patient to be cannulized on 09/16 but he is already going into fluid overload.  She wants to know if we can move the date up 3 wks early since the site looks easy to cannulate.  They will do home hemodialysis training.  Please advise.  Thanks,  Thurston Hole., LPN

## 2020-04-11 DIAGNOSIS — D509 Iron deficiency anemia, unspecified: Secondary | ICD-10-CM | POA: Diagnosis not present

## 2020-04-11 DIAGNOSIS — N186 End stage renal disease: Secondary | ICD-10-CM | POA: Diagnosis not present

## 2020-04-11 DIAGNOSIS — Z992 Dependence on renal dialysis: Secondary | ICD-10-CM | POA: Diagnosis not present

## 2020-04-11 DIAGNOSIS — N2581 Secondary hyperparathyroidism of renal origin: Secondary | ICD-10-CM | POA: Diagnosis not present

## 2020-04-14 MED FILL — LOVASTATIN 20 MG TABS: 20 | 30 days supply | Qty: 30 | Fill #1

## 2020-04-14 MED FILL — CARVEDILOL 12.5 MG TABLET: 12.5 | 30 days supply | Qty: 60 | Fill #2

## 2020-04-14 MED FILL — AMLODIPINE BESYLATE 10 MG T: 10 | 30 days supply | Qty: 30 | Fill #0

## 2020-04-15 DIAGNOSIS — Z992 Dependence on renal dialysis: Secondary | ICD-10-CM | POA: Diagnosis not present

## 2020-04-15 DIAGNOSIS — N186 End stage renal disease: Secondary | ICD-10-CM | POA: Diagnosis not present

## 2020-04-15 DIAGNOSIS — N2581 Secondary hyperparathyroidism of renal origin: Secondary | ICD-10-CM | POA: Diagnosis not present

## 2020-04-15 DIAGNOSIS — D509 Iron deficiency anemia, unspecified: Secondary | ICD-10-CM | POA: Diagnosis not present

## 2020-04-18 DIAGNOSIS — N186 End stage renal disease: Secondary | ICD-10-CM | POA: Diagnosis not present

## 2020-04-18 DIAGNOSIS — N2581 Secondary hyperparathyroidism of renal origin: Secondary | ICD-10-CM | POA: Diagnosis not present

## 2020-04-18 DIAGNOSIS — D509 Iron deficiency anemia, unspecified: Secondary | ICD-10-CM | POA: Diagnosis not present

## 2020-04-18 DIAGNOSIS — Z992 Dependence on renal dialysis: Secondary | ICD-10-CM | POA: Diagnosis not present

## 2020-04-20 DIAGNOSIS — D509 Iron deficiency anemia, unspecified: Secondary | ICD-10-CM | POA: Diagnosis not present

## 2020-04-20 DIAGNOSIS — N186 End stage renal disease: Secondary | ICD-10-CM | POA: Diagnosis not present

## 2020-04-20 DIAGNOSIS — Z992 Dependence on renal dialysis: Secondary | ICD-10-CM | POA: Diagnosis not present

## 2020-04-20 DIAGNOSIS — N2581 Secondary hyperparathyroidism of renal origin: Secondary | ICD-10-CM | POA: Diagnosis not present

## 2020-04-22 DIAGNOSIS — D509 Iron deficiency anemia, unspecified: Secondary | ICD-10-CM | POA: Diagnosis not present

## 2020-04-22 DIAGNOSIS — N186 End stage renal disease: Secondary | ICD-10-CM | POA: Diagnosis not present

## 2020-04-22 DIAGNOSIS — Z992 Dependence on renal dialysis: Secondary | ICD-10-CM | POA: Diagnosis not present

## 2020-04-22 DIAGNOSIS — N2581 Secondary hyperparathyroidism of renal origin: Secondary | ICD-10-CM | POA: Diagnosis not present

## 2020-04-27 DIAGNOSIS — D509 Iron deficiency anemia, unspecified: Secondary | ICD-10-CM | POA: Diagnosis not present

## 2020-04-27 DIAGNOSIS — N186 End stage renal disease: Secondary | ICD-10-CM | POA: Diagnosis not present

## 2020-04-27 DIAGNOSIS — N2581 Secondary hyperparathyroidism of renal origin: Secondary | ICD-10-CM | POA: Diagnosis not present

## 2020-04-27 DIAGNOSIS — Z992 Dependence on renal dialysis: Secondary | ICD-10-CM | POA: Diagnosis not present

## 2020-04-29 DIAGNOSIS — N2581 Secondary hyperparathyroidism of renal origin: Secondary | ICD-10-CM | POA: Diagnosis not present

## 2020-04-29 DIAGNOSIS — Z992 Dependence on renal dialysis: Secondary | ICD-10-CM | POA: Diagnosis not present

## 2020-04-29 DIAGNOSIS — N186 End stage renal disease: Secondary | ICD-10-CM | POA: Diagnosis not present

## 2020-04-29 DIAGNOSIS — D509 Iron deficiency anemia, unspecified: Secondary | ICD-10-CM | POA: Diagnosis not present

## 2020-05-01 ENCOUNTER — Ambulatory Visit: Payer: Medicare Other | Attending: Internal Medicine | Admitting: Internal Medicine

## 2020-05-01 ENCOUNTER — Other Ambulatory Visit: Payer: Self-pay | Admitting: Internal Medicine

## 2020-05-01 DIAGNOSIS — Z23 Encounter for immunization: Secondary | ICD-10-CM | POA: Diagnosis not present

## 2020-05-01 DIAGNOSIS — Z794 Long term (current) use of insulin: Secondary | ICD-10-CM

## 2020-05-01 DIAGNOSIS — N186 End stage renal disease: Secondary | ICD-10-CM

## 2020-05-01 DIAGNOSIS — Z992 Dependence on renal dialysis: Secondary | ICD-10-CM

## 2020-05-01 DIAGNOSIS — E1129 Type 2 diabetes mellitus with other diabetic kidney complication: Secondary | ICD-10-CM | POA: Diagnosis not present

## 2020-05-01 DIAGNOSIS — Z7189 Other specified counseling: Secondary | ICD-10-CM | POA: Diagnosis not present

## 2020-05-01 DIAGNOSIS — E1169 Type 2 diabetes mellitus with other specified complication: Secondary | ICD-10-CM | POA: Diagnosis not present

## 2020-05-01 DIAGNOSIS — I129 Hypertensive chronic kidney disease with stage 1 through stage 4 chronic kidney disease, or unspecified chronic kidney disease: Secondary | ICD-10-CM

## 2020-05-01 DIAGNOSIS — E785 Hyperlipidemia, unspecified: Secondary | ICD-10-CM | POA: Diagnosis not present

## 2020-05-01 MED ORDER — CARVEDILOL 12.5 MG PO TABS: 12.5000 mg | ORAL_TABLET | Freq: Two times a day (BID) | ORAL | 6 refills | Status: DC

## 2020-05-01 MED ORDER — LOVASTATIN 20 MG PO TABS: 20.0000 mg | ORAL_TABLET | Freq: Every day | ORAL | 6 refills | Status: DC

## 2020-05-01 NOTE — Progress Notes (Signed)
Virtual Visit via Telephone Note Due to current restrictions/limitations of in-office visits due to the COVID-19 pandemic, this scheduled clinical appointment was converted to a telehealth visit  I connected with Patrick Ibarra on 05/01/20 at  9:10 AM EDT by telephone and verified that I am speaking with the correct person using two identifiers. I am in my office.  The patient is at home.  Only the patient and myself participated in this encounter.   I discussed the limitations, risks, security and privacy concerns of performing an evaluation and management service by telephone and the availability of in person appointments. I also discussed with the patient that there may be a patient responsible charge related to this service. The patient expressed understanding and agreed to proceed.   History of Present Illness: ESRD, HTN, DM, COVID-19 infection 11/2019, HL  ESRD:  Going to HD regularly on Tue/Thur/Sat  DM: checks BS once a day before BF.  Gives range from 80-100 -he has not had to use Novolog at all -doing okay with eating habits No blurred vision.  No numbness in hands/feet Lab Results  Component Value Date   HGBA1C 5.4 12/03/2019    HTN: checks BP on non-HD days. It is always checked during HD.  Average is 140/90 -limits salt in foods.  No CP/SOB.  Currently no LE edema.  HM:  On the fence but decided to hold off on getting COVID vaccine.  He is not sure why the hesitancy.  Due for flu vaccine.  States he will call and schedule when he is ready  Outpatient Encounter Medications as of 05/01/2020  Medication Sig  . Accu-Chek FastClix Lancets MISC Use as instructed to check blood sugar once daily. E11.9  . amLODipine (NORVASC) 10 MG tablet TAKE 1 TABLET (10 MG TOTAL) BY MOUTH DAILY.  Marland Kitchen Blood Glucose Monitoring Suppl (ACCU-CHEK GUIDE ME) w/Device KIT 1 kit by Does not apply route daily. Use as instructed to check blood sugar once daily. E11.9  . carvedilol (COREG) 12.5 MG tablet Take  1 tablet (12.5 mg total) by mouth 2 (two) times daily with a meal.  . glucose blood (ACCU-CHEK GUIDE) test strip Use as instructed to check blood sugar once daily. E11.9  . insulin aspart (NOVOLOG FLEXPEN) 100 UNIT/ML FlexPen 2 units subcut with meals for BS greater than 150. (Patient taking differently: Inject 2 Units into the skin daily as needed for high blood sugar. Inject 2 units into the skin three times a day with meals for a BGL greater than 150)  . Insulin Pen Needle (PEN NEEDLES) 31G X 8 MM MISC UAD  . lovastatin (MEVACOR) 20 MG tablet TAKE 1 TABLET (20 MG TOTAL) BY MOUTH AT BEDTIME. (Patient taking differently: Take 20 mg by mouth daily. )  . multivitamin (RENA-VIT) TABS tablet Place 1 tablet into feeding tube at bedtime.   No facility-administered encounter medications on file as of 05/01/2020.    Observations/Objective: No direct observation done as this was a tele[hone encounter  Assessment and Plan: 1. Controlled type 2 diabetes mellitus with other diabetic kidney complication, with long-term current use of insulin ([place]) Reported blood sugars are at goal.  He has not had to use the NovoLog but he keeps it on hand in case he does.  Healthy eating habits encouraged.  2. Hypertensive kidney disease Blood pressure not at goal but much improved.  He will continue the carvedilol and amlodipine - carvedilol (COREG) 12.5 MG tablet; Take 1 tablet (12.5 mg total) by mouth  2 (two) times daily with a meal.  Dispense: 60 tablet; Refill: 6  3. ESRD on hemodialysis ([place]) -Continue compliance with going to his HD sessions  4. Hyperlipidemia associated with type 2 diabetes mellitus (HCC) - lovastatin (MEVACOR) 20 MG tablet; Take 1 tablet (20 mg total) by mouth at bedtime.  Dispense: 30 tablet; Refill: 6  5. Educated about COVID-19 virus infection Discussed his hesitancy about getting the COVID-19 vaccine.  Advised that the vaccine is safe and effective.  He is considered high risk and has  had the Covid 19 infection already.  Strongly advised that he get the vaccine.  6. Need for influenza vaccination Advised that he can come and get the vaccine.  He tells me he will call and schedule when he is ready.   Follow Up Instructions: 3-4 mths   I discussed the assessment and treatment plan with the patient. The patient was provided an opportunity to ask questions and all were answered. The patient agreed with the plan and demonstrated an understanding of the instructions.   The patient was advised to call back or seek an in-person evaluation if the symptoms worsen or if the condition fails to improve as anticipated.  I provided 10 minutes of non-face-to-face time during this encounter.   Karle Plumber, MD

## 2020-05-02 DIAGNOSIS — D509 Iron deficiency anemia, unspecified: Secondary | ICD-10-CM | POA: Diagnosis not present

## 2020-05-02 DIAGNOSIS — N186 End stage renal disease: Secondary | ICD-10-CM | POA: Diagnosis not present

## 2020-05-02 DIAGNOSIS — N2581 Secondary hyperparathyroidism of renal origin: Secondary | ICD-10-CM | POA: Diagnosis not present

## 2020-05-02 DIAGNOSIS — Z992 Dependence on renal dialysis: Secondary | ICD-10-CM | POA: Diagnosis not present

## 2020-05-03 DIAGNOSIS — I129 Hypertensive chronic kidney disease with stage 1 through stage 4 chronic kidney disease, or unspecified chronic kidney disease: Secondary | ICD-10-CM | POA: Diagnosis not present

## 2020-05-03 DIAGNOSIS — E1129 Type 2 diabetes mellitus with other diabetic kidney complication: Secondary | ICD-10-CM | POA: Diagnosis not present

## 2020-05-03 DIAGNOSIS — K7689 Other specified diseases of liver: Secondary | ICD-10-CM | POA: Diagnosis not present

## 2020-05-03 DIAGNOSIS — Z992 Dependence on renal dialysis: Secondary | ICD-10-CM | POA: Diagnosis not present

## 2020-05-03 DIAGNOSIS — R82998 Other abnormal findings in urine: Secondary | ICD-10-CM | POA: Diagnosis not present

## 2020-05-03 DIAGNOSIS — N2589 Other disorders resulting from impaired renal tubular function: Secondary | ICD-10-CM | POA: Diagnosis not present

## 2020-05-03 DIAGNOSIS — N2581 Secondary hyperparathyroidism of renal origin: Secondary | ICD-10-CM | POA: Diagnosis not present

## 2020-05-03 DIAGNOSIS — E7849 Other hyperlipidemia: Secondary | ICD-10-CM | POA: Diagnosis not present

## 2020-05-03 DIAGNOSIS — N186 End stage renal disease: Secondary | ICD-10-CM | POA: Diagnosis not present

## 2020-05-03 DIAGNOSIS — D631 Anemia in chronic kidney disease: Secondary | ICD-10-CM | POA: Diagnosis not present

## 2020-05-03 DIAGNOSIS — D509 Iron deficiency anemia, unspecified: Secondary | ICD-10-CM | POA: Diagnosis not present

## 2020-05-06 DIAGNOSIS — N186 End stage renal disease: Secondary | ICD-10-CM | POA: Diagnosis not present

## 2020-05-06 DIAGNOSIS — D631 Anemia in chronic kidney disease: Secondary | ICD-10-CM | POA: Diagnosis not present

## 2020-05-06 DIAGNOSIS — E1129 Type 2 diabetes mellitus with other diabetic kidney complication: Secondary | ICD-10-CM | POA: Diagnosis not present

## 2020-05-06 DIAGNOSIS — Z992 Dependence on renal dialysis: Secondary | ICD-10-CM | POA: Diagnosis not present

## 2020-05-06 DIAGNOSIS — N2581 Secondary hyperparathyroidism of renal origin: Secondary | ICD-10-CM | POA: Diagnosis not present

## 2020-05-09 DIAGNOSIS — D631 Anemia in chronic kidney disease: Secondary | ICD-10-CM | POA: Diagnosis not present

## 2020-05-09 DIAGNOSIS — Z992 Dependence on renal dialysis: Secondary | ICD-10-CM | POA: Diagnosis not present

## 2020-05-09 DIAGNOSIS — E1129 Type 2 diabetes mellitus with other diabetic kidney complication: Secondary | ICD-10-CM | POA: Diagnosis not present

## 2020-05-09 DIAGNOSIS — N2581 Secondary hyperparathyroidism of renal origin: Secondary | ICD-10-CM | POA: Diagnosis not present

## 2020-05-09 DIAGNOSIS — N186 End stage renal disease: Secondary | ICD-10-CM | POA: Diagnosis not present

## 2020-05-09 MED FILL — AMLODIPINE BESYLATE 10 MG T: 10 | 30 days supply | Qty: 30 | Fill #1

## 2020-05-09 MED FILL — CARVEDILOL 12.5 MG TABLET: 12.5 | 30 days supply | Qty: 60 | Fill #3

## 2020-05-09 MED FILL — LOVASTATIN 20 MG TABS: 20 | 30 days supply | Qty: 30 | Fill #2

## 2020-05-11 DIAGNOSIS — N186 End stage renal disease: Secondary | ICD-10-CM | POA: Diagnosis not present

## 2020-05-11 DIAGNOSIS — E1129 Type 2 diabetes mellitus with other diabetic kidney complication: Secondary | ICD-10-CM | POA: Diagnosis not present

## 2020-05-11 DIAGNOSIS — N2581 Secondary hyperparathyroidism of renal origin: Secondary | ICD-10-CM | POA: Diagnosis not present

## 2020-05-11 DIAGNOSIS — Z992 Dependence on renal dialysis: Secondary | ICD-10-CM | POA: Diagnosis not present

## 2020-05-11 DIAGNOSIS — D631 Anemia in chronic kidney disease: Secondary | ICD-10-CM | POA: Diagnosis not present

## 2020-05-16 DIAGNOSIS — Z992 Dependence on renal dialysis: Secondary | ICD-10-CM | POA: Diagnosis not present

## 2020-05-16 DIAGNOSIS — N186 End stage renal disease: Secondary | ICD-10-CM | POA: Diagnosis not present

## 2020-05-16 DIAGNOSIS — E1129 Type 2 diabetes mellitus with other diabetic kidney complication: Secondary | ICD-10-CM | POA: Diagnosis not present

## 2020-05-16 DIAGNOSIS — N2581 Secondary hyperparathyroidism of renal origin: Secondary | ICD-10-CM | POA: Diagnosis not present

## 2020-05-16 DIAGNOSIS — D631 Anemia in chronic kidney disease: Secondary | ICD-10-CM | POA: Diagnosis not present

## 2020-05-18 DIAGNOSIS — N2581 Secondary hyperparathyroidism of renal origin: Secondary | ICD-10-CM | POA: Diagnosis not present

## 2020-05-18 DIAGNOSIS — Z992 Dependence on renal dialysis: Secondary | ICD-10-CM | POA: Diagnosis not present

## 2020-05-18 DIAGNOSIS — D631 Anemia in chronic kidney disease: Secondary | ICD-10-CM | POA: Diagnosis not present

## 2020-05-18 DIAGNOSIS — N186 End stage renal disease: Secondary | ICD-10-CM | POA: Diagnosis not present

## 2020-05-18 DIAGNOSIS — E1129 Type 2 diabetes mellitus with other diabetic kidney complication: Secondary | ICD-10-CM | POA: Diagnosis not present

## 2020-05-20 DIAGNOSIS — Z992 Dependence on renal dialysis: Secondary | ICD-10-CM | POA: Diagnosis not present

## 2020-05-20 DIAGNOSIS — N2581 Secondary hyperparathyroidism of renal origin: Secondary | ICD-10-CM | POA: Diagnosis not present

## 2020-05-20 DIAGNOSIS — N186 End stage renal disease: Secondary | ICD-10-CM | POA: Diagnosis not present

## 2020-05-20 DIAGNOSIS — D631 Anemia in chronic kidney disease: Secondary | ICD-10-CM | POA: Diagnosis not present

## 2020-05-20 DIAGNOSIS — E1129 Type 2 diabetes mellitus with other diabetic kidney complication: Secondary | ICD-10-CM | POA: Diagnosis not present

## 2020-05-23 DIAGNOSIS — Z992 Dependence on renal dialysis: Secondary | ICD-10-CM | POA: Diagnosis not present

## 2020-05-23 DIAGNOSIS — N186 End stage renal disease: Secondary | ICD-10-CM | POA: Diagnosis not present

## 2020-05-23 DIAGNOSIS — E1129 Type 2 diabetes mellitus with other diabetic kidney complication: Secondary | ICD-10-CM | POA: Diagnosis not present

## 2020-05-23 DIAGNOSIS — N2581 Secondary hyperparathyroidism of renal origin: Secondary | ICD-10-CM | POA: Diagnosis not present

## 2020-05-23 DIAGNOSIS — D631 Anemia in chronic kidney disease: Secondary | ICD-10-CM | POA: Diagnosis not present

## 2020-05-25 DIAGNOSIS — N186 End stage renal disease: Secondary | ICD-10-CM | POA: Diagnosis not present

## 2020-05-25 DIAGNOSIS — D631 Anemia in chronic kidney disease: Secondary | ICD-10-CM | POA: Diagnosis not present

## 2020-05-25 DIAGNOSIS — E1129 Type 2 diabetes mellitus with other diabetic kidney complication: Secondary | ICD-10-CM | POA: Diagnosis not present

## 2020-05-25 DIAGNOSIS — N2581 Secondary hyperparathyroidism of renal origin: Secondary | ICD-10-CM | POA: Diagnosis not present

## 2020-05-25 DIAGNOSIS — Z992 Dependence on renal dialysis: Secondary | ICD-10-CM | POA: Diagnosis not present

## 2020-05-27 DIAGNOSIS — Z992 Dependence on renal dialysis: Secondary | ICD-10-CM | POA: Diagnosis not present

## 2020-05-27 DIAGNOSIS — N2581 Secondary hyperparathyroidism of renal origin: Secondary | ICD-10-CM | POA: Diagnosis not present

## 2020-05-27 DIAGNOSIS — D631 Anemia in chronic kidney disease: Secondary | ICD-10-CM | POA: Diagnosis not present

## 2020-05-27 DIAGNOSIS — N186 End stage renal disease: Secondary | ICD-10-CM | POA: Diagnosis not present

## 2020-05-27 DIAGNOSIS — E1129 Type 2 diabetes mellitus with other diabetic kidney complication: Secondary | ICD-10-CM | POA: Diagnosis not present

## 2020-05-30 DIAGNOSIS — Z992 Dependence on renal dialysis: Secondary | ICD-10-CM | POA: Diagnosis not present

## 2020-05-30 DIAGNOSIS — N2581 Secondary hyperparathyroidism of renal origin: Secondary | ICD-10-CM | POA: Diagnosis not present

## 2020-05-30 DIAGNOSIS — N186 End stage renal disease: Secondary | ICD-10-CM | POA: Diagnosis not present

## 2020-05-30 DIAGNOSIS — D631 Anemia in chronic kidney disease: Secondary | ICD-10-CM | POA: Diagnosis not present

## 2020-05-30 DIAGNOSIS — E1129 Type 2 diabetes mellitus with other diabetic kidney complication: Secondary | ICD-10-CM | POA: Diagnosis not present

## 2020-05-31 ENCOUNTER — Telehealth: Payer: Self-pay

## 2020-05-31 NOTE — Telephone Encounter (Signed)
As per Novella Olive, [employer] [place] , they have opened a case for the patient and he has been assigned to an attorney

## 2020-06-01 DIAGNOSIS — N2581 Secondary hyperparathyroidism of renal origin: Secondary | ICD-10-CM | POA: Diagnosis not present

## 2020-06-01 DIAGNOSIS — Z992 Dependence on renal dialysis: Secondary | ICD-10-CM | POA: Diagnosis not present

## 2020-06-01 DIAGNOSIS — E1129 Type 2 diabetes mellitus with other diabetic kidney complication: Secondary | ICD-10-CM | POA: Diagnosis not present

## 2020-06-01 DIAGNOSIS — D631 Anemia in chronic kidney disease: Secondary | ICD-10-CM | POA: Diagnosis not present

## 2020-06-01 DIAGNOSIS — N186 End stage renal disease: Secondary | ICD-10-CM | POA: Diagnosis not present

## 2020-06-02 DIAGNOSIS — Z992 Dependence on renal dialysis: Secondary | ICD-10-CM | POA: Diagnosis not present

## 2020-06-02 DIAGNOSIS — I129 Hypertensive chronic kidney disease with stage 1 through stage 4 chronic kidney disease, or unspecified chronic kidney disease: Secondary | ICD-10-CM | POA: Diagnosis not present

## 2020-06-02 DIAGNOSIS — N186 End stage renal disease: Secondary | ICD-10-CM | POA: Diagnosis not present

## 2020-06-03 DIAGNOSIS — E1129 Type 2 diabetes mellitus with other diabetic kidney complication: Secondary | ICD-10-CM | POA: Diagnosis not present

## 2020-06-03 DIAGNOSIS — Z992 Dependence on renal dialysis: Secondary | ICD-10-CM | POA: Diagnosis not present

## 2020-06-03 DIAGNOSIS — N2581 Secondary hyperparathyroidism of renal origin: Secondary | ICD-10-CM | POA: Diagnosis not present

## 2020-06-03 DIAGNOSIS — N186 End stage renal disease: Secondary | ICD-10-CM | POA: Diagnosis not present

## 2020-06-05 DIAGNOSIS — N186 End stage renal disease: Secondary | ICD-10-CM | POA: Diagnosis not present

## 2020-06-05 DIAGNOSIS — Z452 Encounter for adjustment and management of vascular access device: Secondary | ICD-10-CM | POA: Diagnosis not present

## 2020-06-06 DIAGNOSIS — E1129 Type 2 diabetes mellitus with other diabetic kidney complication: Secondary | ICD-10-CM | POA: Diagnosis not present

## 2020-06-06 DIAGNOSIS — Z992 Dependence on renal dialysis: Secondary | ICD-10-CM | POA: Diagnosis not present

## 2020-06-06 DIAGNOSIS — N2581 Secondary hyperparathyroidism of renal origin: Secondary | ICD-10-CM | POA: Diagnosis not present

## 2020-06-06 DIAGNOSIS — N186 End stage renal disease: Secondary | ICD-10-CM | POA: Diagnosis not present

## 2020-06-10 DIAGNOSIS — Z992 Dependence on renal dialysis: Secondary | ICD-10-CM | POA: Diagnosis not present

## 2020-06-10 DIAGNOSIS — N2581 Secondary hyperparathyroidism of renal origin: Secondary | ICD-10-CM | POA: Diagnosis not present

## 2020-06-10 DIAGNOSIS — E1129 Type 2 diabetes mellitus with other diabetic kidney complication: Secondary | ICD-10-CM | POA: Diagnosis not present

## 2020-06-10 DIAGNOSIS — N186 End stage renal disease: Secondary | ICD-10-CM | POA: Diagnosis not present

## 2020-06-13 DIAGNOSIS — Z992 Dependence on renal dialysis: Secondary | ICD-10-CM | POA: Diagnosis not present

## 2020-06-13 DIAGNOSIS — E1129 Type 2 diabetes mellitus with other diabetic kidney complication: Secondary | ICD-10-CM | POA: Diagnosis not present

## 2020-06-13 DIAGNOSIS — N186 End stage renal disease: Secondary | ICD-10-CM | POA: Diagnosis not present

## 2020-06-13 DIAGNOSIS — N2581 Secondary hyperparathyroidism of renal origin: Secondary | ICD-10-CM | POA: Diagnosis not present

## 2020-06-17 DIAGNOSIS — Z992 Dependence on renal dialysis: Secondary | ICD-10-CM | POA: Diagnosis not present

## 2020-06-17 DIAGNOSIS — N186 End stage renal disease: Secondary | ICD-10-CM | POA: Diagnosis not present

## 2020-06-17 DIAGNOSIS — N2581 Secondary hyperparathyroidism of renal origin: Secondary | ICD-10-CM | POA: Diagnosis not present

## 2020-06-17 DIAGNOSIS — E1129 Type 2 diabetes mellitus with other diabetic kidney complication: Secondary | ICD-10-CM | POA: Diagnosis not present

## 2020-06-20 DIAGNOSIS — N186 End stage renal disease: Secondary | ICD-10-CM | POA: Diagnosis not present

## 2020-06-20 DIAGNOSIS — N2581 Secondary hyperparathyroidism of renal origin: Secondary | ICD-10-CM | POA: Diagnosis not present

## 2020-06-20 DIAGNOSIS — Z992 Dependence on renal dialysis: Secondary | ICD-10-CM | POA: Diagnosis not present

## 2020-06-20 DIAGNOSIS — E1129 Type 2 diabetes mellitus with other diabetic kidney complication: Secondary | ICD-10-CM | POA: Diagnosis not present

## 2020-06-27 DIAGNOSIS — E1129 Type 2 diabetes mellitus with other diabetic kidney complication: Secondary | ICD-10-CM | POA: Diagnosis not present

## 2020-06-27 DIAGNOSIS — Z992 Dependence on renal dialysis: Secondary | ICD-10-CM | POA: Diagnosis not present

## 2020-06-27 DIAGNOSIS — N2581 Secondary hyperparathyroidism of renal origin: Secondary | ICD-10-CM | POA: Diagnosis not present

## 2020-06-27 DIAGNOSIS — N186 End stage renal disease: Secondary | ICD-10-CM | POA: Diagnosis not present

## 2020-06-29 DIAGNOSIS — E1129 Type 2 diabetes mellitus with other diabetic kidney complication: Secondary | ICD-10-CM | POA: Diagnosis not present

## 2020-06-29 DIAGNOSIS — N186 End stage renal disease: Secondary | ICD-10-CM | POA: Diagnosis not present

## 2020-06-29 DIAGNOSIS — N2581 Secondary hyperparathyroidism of renal origin: Secondary | ICD-10-CM | POA: Diagnosis not present

## 2020-06-29 DIAGNOSIS — Z992 Dependence on renal dialysis: Secondary | ICD-10-CM | POA: Diagnosis not present

## 2020-07-01 DIAGNOSIS — E1129 Type 2 diabetes mellitus with other diabetic kidney complication: Secondary | ICD-10-CM | POA: Diagnosis not present

## 2020-07-01 DIAGNOSIS — Z992 Dependence on renal dialysis: Secondary | ICD-10-CM | POA: Diagnosis not present

## 2020-07-01 DIAGNOSIS — N2581 Secondary hyperparathyroidism of renal origin: Secondary | ICD-10-CM | POA: Diagnosis not present

## 2020-07-01 DIAGNOSIS — N186 End stage renal disease: Secondary | ICD-10-CM | POA: Diagnosis not present

## 2020-07-03 DIAGNOSIS — Z992 Dependence on renal dialysis: Secondary | ICD-10-CM | POA: Diagnosis not present

## 2020-07-03 DIAGNOSIS — I129 Hypertensive chronic kidney disease with stage 1 through stage 4 chronic kidney disease, or unspecified chronic kidney disease: Secondary | ICD-10-CM | POA: Diagnosis not present

## 2020-07-03 DIAGNOSIS — N186 End stage renal disease: Secondary | ICD-10-CM | POA: Diagnosis not present

## 2020-07-04 DIAGNOSIS — Z992 Dependence on renal dialysis: Secondary | ICD-10-CM | POA: Diagnosis not present

## 2020-07-04 DIAGNOSIS — Z23 Encounter for immunization: Secondary | ICD-10-CM | POA: Diagnosis not present

## 2020-07-04 DIAGNOSIS — D631 Anemia in chronic kidney disease: Secondary | ICD-10-CM | POA: Diagnosis not present

## 2020-07-04 DIAGNOSIS — N2581 Secondary hyperparathyroidism of renal origin: Secondary | ICD-10-CM | POA: Diagnosis not present

## 2020-07-04 DIAGNOSIS — N186 End stage renal disease: Secondary | ICD-10-CM | POA: Diagnosis not present

## 2020-07-05 MED FILL — LOVASTATIN 20 MG TABS: 20 | 90 days supply | Qty: 90 | Fill #0

## 2020-07-05 MED FILL — AMLODIPINE BESYLATE 10 MG T: 10 | 90 days supply | Qty: 90 | Fill #2

## 2020-07-05 MED FILL — CARVEDILOL 12.5 MG TABLET: 12.5 | 30 days supply | Qty: 60 | Fill #4

## 2020-07-06 DIAGNOSIS — Z992 Dependence on renal dialysis: Secondary | ICD-10-CM | POA: Diagnosis not present

## 2020-07-06 DIAGNOSIS — Z23 Encounter for immunization: Secondary | ICD-10-CM | POA: Diagnosis not present

## 2020-07-06 DIAGNOSIS — D631 Anemia in chronic kidney disease: Secondary | ICD-10-CM | POA: Diagnosis not present

## 2020-07-06 DIAGNOSIS — N2581 Secondary hyperparathyroidism of renal origin: Secondary | ICD-10-CM | POA: Diagnosis not present

## 2020-07-06 DIAGNOSIS — N186 End stage renal disease: Secondary | ICD-10-CM | POA: Diagnosis not present

## 2020-07-08 DIAGNOSIS — Z23 Encounter for immunization: Secondary | ICD-10-CM | POA: Diagnosis not present

## 2020-07-08 DIAGNOSIS — Z992 Dependence on renal dialysis: Secondary | ICD-10-CM | POA: Diagnosis not present

## 2020-07-08 DIAGNOSIS — D631 Anemia in chronic kidney disease: Secondary | ICD-10-CM | POA: Diagnosis not present

## 2020-07-08 DIAGNOSIS — N2581 Secondary hyperparathyroidism of renal origin: Secondary | ICD-10-CM | POA: Diagnosis not present

## 2020-07-08 DIAGNOSIS — N186 End stage renal disease: Secondary | ICD-10-CM | POA: Diagnosis not present

## 2020-07-11 DIAGNOSIS — N186 End stage renal disease: Secondary | ICD-10-CM | POA: Diagnosis not present

## 2020-07-11 DIAGNOSIS — Z992 Dependence on renal dialysis: Secondary | ICD-10-CM | POA: Diagnosis not present

## 2020-07-11 DIAGNOSIS — Z23 Encounter for immunization: Secondary | ICD-10-CM | POA: Diagnosis not present

## 2020-07-11 DIAGNOSIS — D631 Anemia in chronic kidney disease: Secondary | ICD-10-CM | POA: Diagnosis not present

## 2020-07-11 DIAGNOSIS — N2581 Secondary hyperparathyroidism of renal origin: Secondary | ICD-10-CM | POA: Diagnosis not present

## 2020-07-13 DIAGNOSIS — D631 Anemia in chronic kidney disease: Secondary | ICD-10-CM | POA: Diagnosis not present

## 2020-07-13 DIAGNOSIS — N2581 Secondary hyperparathyroidism of renal origin: Secondary | ICD-10-CM | POA: Diagnosis not present

## 2020-07-13 DIAGNOSIS — N186 End stage renal disease: Secondary | ICD-10-CM | POA: Diagnosis not present

## 2020-07-13 DIAGNOSIS — Z23 Encounter for immunization: Secondary | ICD-10-CM | POA: Diagnosis not present

## 2020-07-13 DIAGNOSIS — Z992 Dependence on renal dialysis: Secondary | ICD-10-CM | POA: Diagnosis not present

## 2020-07-17 MED FILL — CARVEDILOL 12.5 MG TABLET: 12.5 | 30 days supply | Qty: 60 | Fill #4

## 2020-07-17 MED FILL — AMLODIPINE BESYLATE 10 MG T: 10 | 30 days supply | Qty: 30 | Fill #2

## 2020-07-17 MED FILL — LOVASTATIN 20 MG TABS: 20 | 90 days supply | Qty: 90 | Fill #0

## 2020-07-18 DIAGNOSIS — D631 Anemia in chronic kidney disease: Secondary | ICD-10-CM | POA: Diagnosis not present

## 2020-07-18 DIAGNOSIS — N2581 Secondary hyperparathyroidism of renal origin: Secondary | ICD-10-CM | POA: Diagnosis not present

## 2020-07-18 DIAGNOSIS — Z23 Encounter for immunization: Secondary | ICD-10-CM | POA: Diagnosis not present

## 2020-07-18 DIAGNOSIS — Z992 Dependence on renal dialysis: Secondary | ICD-10-CM | POA: Diagnosis not present

## 2020-07-18 DIAGNOSIS — N186 End stage renal disease: Secondary | ICD-10-CM | POA: Diagnosis not present

## 2020-07-20 DIAGNOSIS — Z992 Dependence on renal dialysis: Secondary | ICD-10-CM | POA: Diagnosis not present

## 2020-07-20 DIAGNOSIS — Z23 Encounter for immunization: Secondary | ICD-10-CM | POA: Diagnosis not present

## 2020-07-20 DIAGNOSIS — D631 Anemia in chronic kidney disease: Secondary | ICD-10-CM | POA: Diagnosis not present

## 2020-07-20 DIAGNOSIS — N186 End stage renal disease: Secondary | ICD-10-CM | POA: Diagnosis not present

## 2020-07-20 DIAGNOSIS — N2581 Secondary hyperparathyroidism of renal origin: Secondary | ICD-10-CM | POA: Diagnosis not present

## 2020-07-22 DIAGNOSIS — D631 Anemia in chronic kidney disease: Secondary | ICD-10-CM | POA: Diagnosis not present

## 2020-07-22 DIAGNOSIS — N2581 Secondary hyperparathyroidism of renal origin: Secondary | ICD-10-CM | POA: Diagnosis not present

## 2020-07-22 DIAGNOSIS — Z23 Encounter for immunization: Secondary | ICD-10-CM | POA: Diagnosis not present

## 2020-07-22 DIAGNOSIS — Z992 Dependence on renal dialysis: Secondary | ICD-10-CM | POA: Diagnosis not present

## 2020-07-22 DIAGNOSIS — N186 End stage renal disease: Secondary | ICD-10-CM | POA: Diagnosis not present

## 2020-07-24 DIAGNOSIS — Z992 Dependence on renal dialysis: Secondary | ICD-10-CM | POA: Diagnosis not present

## 2020-07-24 DIAGNOSIS — D631 Anemia in chronic kidney disease: Secondary | ICD-10-CM | POA: Diagnosis not present

## 2020-07-24 DIAGNOSIS — N186 End stage renal disease: Secondary | ICD-10-CM | POA: Diagnosis not present

## 2020-07-24 DIAGNOSIS — Z23 Encounter for immunization: Secondary | ICD-10-CM | POA: Diagnosis not present

## 2020-07-24 DIAGNOSIS — N2581 Secondary hyperparathyroidism of renal origin: Secondary | ICD-10-CM | POA: Diagnosis not present

## 2020-07-26 DIAGNOSIS — Z992 Dependence on renal dialysis: Secondary | ICD-10-CM | POA: Diagnosis not present

## 2020-07-26 DIAGNOSIS — N2581 Secondary hyperparathyroidism of renal origin: Secondary | ICD-10-CM | POA: Diagnosis not present

## 2020-07-26 DIAGNOSIS — N186 End stage renal disease: Secondary | ICD-10-CM | POA: Diagnosis not present

## 2020-07-26 DIAGNOSIS — Z23 Encounter for immunization: Secondary | ICD-10-CM | POA: Diagnosis not present

## 2020-07-26 DIAGNOSIS — D631 Anemia in chronic kidney disease: Secondary | ICD-10-CM | POA: Diagnosis not present

## 2020-08-01 DIAGNOSIS — D631 Anemia in chronic kidney disease: Secondary | ICD-10-CM | POA: Diagnosis not present

## 2020-08-01 DIAGNOSIS — Z23 Encounter for immunization: Secondary | ICD-10-CM | POA: Diagnosis not present

## 2020-08-01 DIAGNOSIS — Z992 Dependence on renal dialysis: Secondary | ICD-10-CM | POA: Diagnosis not present

## 2020-08-01 DIAGNOSIS — N2581 Secondary hyperparathyroidism of renal origin: Secondary | ICD-10-CM | POA: Diagnosis not present

## 2020-08-01 DIAGNOSIS — N186 End stage renal disease: Secondary | ICD-10-CM | POA: Diagnosis not present

## 2020-08-02 DIAGNOSIS — I129 Hypertensive chronic kidney disease with stage 1 through stage 4 chronic kidney disease, or unspecified chronic kidney disease: Secondary | ICD-10-CM | POA: Diagnosis not present

## 2020-08-02 DIAGNOSIS — N186 End stage renal disease: Secondary | ICD-10-CM | POA: Diagnosis not present

## 2020-08-02 DIAGNOSIS — Z992 Dependence on renal dialysis: Secondary | ICD-10-CM | POA: Diagnosis not present

## 2020-08-03 DIAGNOSIS — N2581 Secondary hyperparathyroidism of renal origin: Secondary | ICD-10-CM | POA: Diagnosis not present

## 2020-08-03 DIAGNOSIS — Z23 Encounter for immunization: Secondary | ICD-10-CM | POA: Diagnosis not present

## 2020-08-03 DIAGNOSIS — N186 End stage renal disease: Secondary | ICD-10-CM | POA: Diagnosis not present

## 2020-08-03 DIAGNOSIS — Z992 Dependence on renal dialysis: Secondary | ICD-10-CM | POA: Diagnosis not present

## 2020-08-03 DIAGNOSIS — D631 Anemia in chronic kidney disease: Secondary | ICD-10-CM | POA: Diagnosis not present

## 2020-08-05 DIAGNOSIS — D631 Anemia in chronic kidney disease: Secondary | ICD-10-CM | POA: Diagnosis not present

## 2020-08-05 DIAGNOSIS — Z23 Encounter for immunization: Secondary | ICD-10-CM | POA: Diagnosis not present

## 2020-08-05 DIAGNOSIS — N2581 Secondary hyperparathyroidism of renal origin: Secondary | ICD-10-CM | POA: Diagnosis not present

## 2020-08-05 DIAGNOSIS — N186 End stage renal disease: Secondary | ICD-10-CM | POA: Diagnosis not present

## 2020-08-05 DIAGNOSIS — Z992 Dependence on renal dialysis: Secondary | ICD-10-CM | POA: Diagnosis not present

## 2020-08-08 DIAGNOSIS — N2581 Secondary hyperparathyroidism of renal origin: Secondary | ICD-10-CM | POA: Diagnosis not present

## 2020-08-08 DIAGNOSIS — N186 End stage renal disease: Secondary | ICD-10-CM | POA: Diagnosis not present

## 2020-08-08 DIAGNOSIS — D631 Anemia in chronic kidney disease: Secondary | ICD-10-CM | POA: Diagnosis not present

## 2020-08-08 DIAGNOSIS — Z23 Encounter for immunization: Secondary | ICD-10-CM | POA: Diagnosis not present

## 2020-08-08 DIAGNOSIS — Z992 Dependence on renal dialysis: Secondary | ICD-10-CM | POA: Diagnosis not present

## 2020-08-12 DIAGNOSIS — N2581 Secondary hyperparathyroidism of renal origin: Secondary | ICD-10-CM | POA: Diagnosis not present

## 2020-08-12 DIAGNOSIS — Z23 Encounter for immunization: Secondary | ICD-10-CM | POA: Diagnosis not present

## 2020-08-12 DIAGNOSIS — D631 Anemia in chronic kidney disease: Secondary | ICD-10-CM | POA: Diagnosis not present

## 2020-08-12 DIAGNOSIS — N186 End stage renal disease: Secondary | ICD-10-CM | POA: Diagnosis not present

## 2020-08-12 DIAGNOSIS — Z992 Dependence on renal dialysis: Secondary | ICD-10-CM | POA: Diagnosis not present

## 2020-08-15 DIAGNOSIS — N186 End stage renal disease: Secondary | ICD-10-CM | POA: Diagnosis not present

## 2020-08-15 DIAGNOSIS — Z23 Encounter for immunization: Secondary | ICD-10-CM | POA: Diagnosis not present

## 2020-08-15 DIAGNOSIS — N2581 Secondary hyperparathyroidism of renal origin: Secondary | ICD-10-CM | POA: Diagnosis not present

## 2020-08-15 DIAGNOSIS — D631 Anemia in chronic kidney disease: Secondary | ICD-10-CM | POA: Diagnosis not present

## 2020-08-15 DIAGNOSIS — Z992 Dependence on renal dialysis: Secondary | ICD-10-CM | POA: Diagnosis not present

## 2020-08-17 DIAGNOSIS — Z992 Dependence on renal dialysis: Secondary | ICD-10-CM | POA: Diagnosis not present

## 2020-08-17 DIAGNOSIS — D631 Anemia in chronic kidney disease: Secondary | ICD-10-CM | POA: Diagnosis not present

## 2020-08-17 DIAGNOSIS — N2581 Secondary hyperparathyroidism of renal origin: Secondary | ICD-10-CM | POA: Diagnosis not present

## 2020-08-17 DIAGNOSIS — N186 End stage renal disease: Secondary | ICD-10-CM | POA: Diagnosis not present

## 2020-08-17 DIAGNOSIS — Z23 Encounter for immunization: Secondary | ICD-10-CM | POA: Diagnosis not present

## 2020-08-19 DIAGNOSIS — N186 End stage renal disease: Secondary | ICD-10-CM | POA: Diagnosis not present

## 2020-08-19 DIAGNOSIS — D631 Anemia in chronic kidney disease: Secondary | ICD-10-CM | POA: Diagnosis not present

## 2020-08-19 DIAGNOSIS — Z992 Dependence on renal dialysis: Secondary | ICD-10-CM | POA: Diagnosis not present

## 2020-08-19 DIAGNOSIS — Z23 Encounter for immunization: Secondary | ICD-10-CM | POA: Diagnosis not present

## 2020-08-19 DIAGNOSIS — N2581 Secondary hyperparathyroidism of renal origin: Secondary | ICD-10-CM | POA: Diagnosis not present

## 2020-08-22 DIAGNOSIS — Z992 Dependence on renal dialysis: Secondary | ICD-10-CM | POA: Diagnosis not present

## 2020-08-22 DIAGNOSIS — D631 Anemia in chronic kidney disease: Secondary | ICD-10-CM | POA: Diagnosis not present

## 2020-08-22 DIAGNOSIS — N2581 Secondary hyperparathyroidism of renal origin: Secondary | ICD-10-CM | POA: Diagnosis not present

## 2020-08-22 DIAGNOSIS — Z23 Encounter for immunization: Secondary | ICD-10-CM | POA: Diagnosis not present

## 2020-08-22 DIAGNOSIS — N186 End stage renal disease: Secondary | ICD-10-CM | POA: Diagnosis not present

## 2020-08-24 DIAGNOSIS — Z23 Encounter for immunization: Secondary | ICD-10-CM | POA: Diagnosis not present

## 2020-08-24 DIAGNOSIS — N2581 Secondary hyperparathyroidism of renal origin: Secondary | ICD-10-CM | POA: Diagnosis not present

## 2020-08-24 DIAGNOSIS — Z992 Dependence on renal dialysis: Secondary | ICD-10-CM | POA: Diagnosis not present

## 2020-08-24 DIAGNOSIS — D631 Anemia in chronic kidney disease: Secondary | ICD-10-CM | POA: Diagnosis not present

## 2020-08-24 DIAGNOSIS — N186 End stage renal disease: Secondary | ICD-10-CM | POA: Diagnosis not present

## 2020-08-29 DIAGNOSIS — N186 End stage renal disease: Secondary | ICD-10-CM | POA: Diagnosis not present

## 2020-08-29 DIAGNOSIS — Z23 Encounter for immunization: Secondary | ICD-10-CM | POA: Diagnosis not present

## 2020-08-29 DIAGNOSIS — D631 Anemia in chronic kidney disease: Secondary | ICD-10-CM | POA: Diagnosis not present

## 2020-08-29 DIAGNOSIS — N2581 Secondary hyperparathyroidism of renal origin: Secondary | ICD-10-CM | POA: Diagnosis not present

## 2020-08-29 DIAGNOSIS — Z992 Dependence on renal dialysis: Secondary | ICD-10-CM | POA: Diagnosis not present

## 2020-08-31 DIAGNOSIS — N2581 Secondary hyperparathyroidism of renal origin: Secondary | ICD-10-CM | POA: Diagnosis not present

## 2020-08-31 DIAGNOSIS — N186 End stage renal disease: Secondary | ICD-10-CM | POA: Diagnosis not present

## 2020-08-31 DIAGNOSIS — D631 Anemia in chronic kidney disease: Secondary | ICD-10-CM | POA: Diagnosis not present

## 2020-08-31 DIAGNOSIS — Z23 Encounter for immunization: Secondary | ICD-10-CM | POA: Diagnosis not present

## 2020-08-31 DIAGNOSIS — Z992 Dependence on renal dialysis: Secondary | ICD-10-CM | POA: Diagnosis not present

## 2020-09-02 DIAGNOSIS — N186 End stage renal disease: Secondary | ICD-10-CM | POA: Diagnosis not present

## 2020-09-02 DIAGNOSIS — Z992 Dependence on renal dialysis: Secondary | ICD-10-CM | POA: Diagnosis not present

## 2020-09-02 DIAGNOSIS — I129 Hypertensive chronic kidney disease with stage 1 through stage 4 chronic kidney disease, or unspecified chronic kidney disease: Secondary | ICD-10-CM | POA: Diagnosis not present

## 2020-09-05 ENCOUNTER — Ambulatory Visit: Payer: Medicare Other | Admitting: Internal Medicine

## 2020-09-05 DIAGNOSIS — N2581 Secondary hyperparathyroidism of renal origin: Secondary | ICD-10-CM | POA: Diagnosis not present

## 2020-09-05 DIAGNOSIS — Z23 Encounter for immunization: Secondary | ICD-10-CM | POA: Diagnosis not present

## 2020-09-05 DIAGNOSIS — N186 End stage renal disease: Secondary | ICD-10-CM | POA: Diagnosis not present

## 2020-09-05 DIAGNOSIS — Z992 Dependence on renal dialysis: Secondary | ICD-10-CM | POA: Diagnosis not present

## 2020-09-09 DIAGNOSIS — Z992 Dependence on renal dialysis: Secondary | ICD-10-CM | POA: Diagnosis not present

## 2020-09-09 DIAGNOSIS — N186 End stage renal disease: Secondary | ICD-10-CM | POA: Diagnosis not present

## 2020-09-09 DIAGNOSIS — Z23 Encounter for immunization: Secondary | ICD-10-CM | POA: Diagnosis not present

## 2020-09-09 DIAGNOSIS — N2581 Secondary hyperparathyroidism of renal origin: Secondary | ICD-10-CM | POA: Diagnosis not present

## 2020-09-12 DIAGNOSIS — N186 End stage renal disease: Secondary | ICD-10-CM | POA: Diagnosis not present

## 2020-09-12 DIAGNOSIS — Z23 Encounter for immunization: Secondary | ICD-10-CM | POA: Diagnosis not present

## 2020-09-12 DIAGNOSIS — Z992 Dependence on renal dialysis: Secondary | ICD-10-CM | POA: Diagnosis not present

## 2020-09-12 DIAGNOSIS — N2581 Secondary hyperparathyroidism of renal origin: Secondary | ICD-10-CM | POA: Diagnosis not present

## 2020-09-14 DIAGNOSIS — Z23 Encounter for immunization: Secondary | ICD-10-CM | POA: Diagnosis not present

## 2020-09-14 DIAGNOSIS — N2581 Secondary hyperparathyroidism of renal origin: Secondary | ICD-10-CM | POA: Diagnosis not present

## 2020-09-14 DIAGNOSIS — N186 End stage renal disease: Secondary | ICD-10-CM | POA: Diagnosis not present

## 2020-09-14 DIAGNOSIS — Z992 Dependence on renal dialysis: Secondary | ICD-10-CM | POA: Diagnosis not present

## 2020-09-16 DIAGNOSIS — N186 End stage renal disease: Secondary | ICD-10-CM | POA: Diagnosis not present

## 2020-09-16 DIAGNOSIS — Z23 Encounter for immunization: Secondary | ICD-10-CM | POA: Diagnosis not present

## 2020-09-16 DIAGNOSIS — Z992 Dependence on renal dialysis: Secondary | ICD-10-CM | POA: Diagnosis not present

## 2020-09-16 DIAGNOSIS — N2581 Secondary hyperparathyroidism of renal origin: Secondary | ICD-10-CM | POA: Diagnosis not present

## 2020-09-19 DIAGNOSIS — Z992 Dependence on renal dialysis: Secondary | ICD-10-CM | POA: Diagnosis not present

## 2020-09-19 DIAGNOSIS — N2581 Secondary hyperparathyroidism of renal origin: Secondary | ICD-10-CM | POA: Diagnosis not present

## 2020-09-19 DIAGNOSIS — Z23 Encounter for immunization: Secondary | ICD-10-CM | POA: Diagnosis not present

## 2020-09-19 DIAGNOSIS — N186 End stage renal disease: Secondary | ICD-10-CM | POA: Diagnosis not present

## 2020-09-21 DIAGNOSIS — N186 End stage renal disease: Secondary | ICD-10-CM | POA: Diagnosis not present

## 2020-09-21 DIAGNOSIS — N2581 Secondary hyperparathyroidism of renal origin: Secondary | ICD-10-CM | POA: Diagnosis not present

## 2020-09-21 DIAGNOSIS — Z23 Encounter for immunization: Secondary | ICD-10-CM | POA: Diagnosis not present

## 2020-09-21 DIAGNOSIS — Z992 Dependence on renal dialysis: Secondary | ICD-10-CM | POA: Diagnosis not present

## 2020-09-22 MED FILL — AMLODIPINE BESYLATE 10 MG T: 10 | 30 days supply | Qty: 30 | Fill #3

## 2020-09-22 MED FILL — CARVEDILOL 12.5 MG TABLET: 12.5 | 30 days supply | Qty: 60 | Fill #0

## 2020-09-26 DIAGNOSIS — Z23 Encounter for immunization: Secondary | ICD-10-CM | POA: Diagnosis not present

## 2020-09-26 DIAGNOSIS — Z992 Dependence on renal dialysis: Secondary | ICD-10-CM | POA: Diagnosis not present

## 2020-09-26 DIAGNOSIS — N186 End stage renal disease: Secondary | ICD-10-CM | POA: Diagnosis not present

## 2020-09-26 DIAGNOSIS — N2581 Secondary hyperparathyroidism of renal origin: Secondary | ICD-10-CM | POA: Diagnosis not present

## 2020-09-30 DIAGNOSIS — Z992 Dependence on renal dialysis: Secondary | ICD-10-CM | POA: Diagnosis not present

## 2020-09-30 DIAGNOSIS — Z23 Encounter for immunization: Secondary | ICD-10-CM | POA: Diagnosis not present

## 2020-09-30 DIAGNOSIS — N186 End stage renal disease: Secondary | ICD-10-CM | POA: Diagnosis not present

## 2020-09-30 DIAGNOSIS — N2581 Secondary hyperparathyroidism of renal origin: Secondary | ICD-10-CM | POA: Diagnosis not present

## 2020-10-03 DIAGNOSIS — Z992 Dependence on renal dialysis: Secondary | ICD-10-CM | POA: Diagnosis not present

## 2020-10-03 DIAGNOSIS — I129 Hypertensive chronic kidney disease with stage 1 through stage 4 chronic kidney disease, or unspecified chronic kidney disease: Secondary | ICD-10-CM | POA: Diagnosis not present

## 2020-10-03 DIAGNOSIS — N186 End stage renal disease: Secondary | ICD-10-CM | POA: Diagnosis not present

## 2020-10-03 DIAGNOSIS — D631 Anemia in chronic kidney disease: Secondary | ICD-10-CM | POA: Diagnosis not present

## 2020-10-03 DIAGNOSIS — N2581 Secondary hyperparathyroidism of renal origin: Secondary | ICD-10-CM | POA: Diagnosis not present

## 2020-10-05 DIAGNOSIS — N186 End stage renal disease: Secondary | ICD-10-CM | POA: Diagnosis not present

## 2020-10-05 DIAGNOSIS — Z992 Dependence on renal dialysis: Secondary | ICD-10-CM | POA: Diagnosis not present

## 2020-10-05 DIAGNOSIS — N2581 Secondary hyperparathyroidism of renal origin: Secondary | ICD-10-CM | POA: Diagnosis not present

## 2020-10-05 DIAGNOSIS — D631 Anemia in chronic kidney disease: Secondary | ICD-10-CM | POA: Diagnosis not present

## 2020-10-07 DIAGNOSIS — N186 End stage renal disease: Secondary | ICD-10-CM | POA: Diagnosis not present

## 2020-10-07 DIAGNOSIS — D631 Anemia in chronic kidney disease: Secondary | ICD-10-CM | POA: Diagnosis not present

## 2020-10-07 DIAGNOSIS — Z992 Dependence on renal dialysis: Secondary | ICD-10-CM | POA: Diagnosis not present

## 2020-10-07 DIAGNOSIS — N2581 Secondary hyperparathyroidism of renal origin: Secondary | ICD-10-CM | POA: Diagnosis not present

## 2020-10-10 DIAGNOSIS — N186 End stage renal disease: Secondary | ICD-10-CM | POA: Diagnosis not present

## 2020-10-10 DIAGNOSIS — D631 Anemia in chronic kidney disease: Secondary | ICD-10-CM | POA: Diagnosis not present

## 2020-10-10 DIAGNOSIS — N2581 Secondary hyperparathyroidism of renal origin: Secondary | ICD-10-CM | POA: Diagnosis not present

## 2020-10-10 DIAGNOSIS — Z992 Dependence on renal dialysis: Secondary | ICD-10-CM | POA: Diagnosis not present

## 2020-10-12 DIAGNOSIS — N2581 Secondary hyperparathyroidism of renal origin: Secondary | ICD-10-CM | POA: Diagnosis not present

## 2020-10-12 DIAGNOSIS — N186 End stage renal disease: Secondary | ICD-10-CM | POA: Diagnosis not present

## 2020-10-12 DIAGNOSIS — D631 Anemia in chronic kidney disease: Secondary | ICD-10-CM | POA: Diagnosis not present

## 2020-10-12 DIAGNOSIS — Z992 Dependence on renal dialysis: Secondary | ICD-10-CM | POA: Diagnosis not present

## 2020-10-17 DIAGNOSIS — N186 End stage renal disease: Secondary | ICD-10-CM | POA: Diagnosis not present

## 2020-10-17 DIAGNOSIS — N2581 Secondary hyperparathyroidism of renal origin: Secondary | ICD-10-CM | POA: Diagnosis not present

## 2020-10-17 DIAGNOSIS — Z992 Dependence on renal dialysis: Secondary | ICD-10-CM | POA: Diagnosis not present

## 2020-10-17 DIAGNOSIS — D631 Anemia in chronic kidney disease: Secondary | ICD-10-CM | POA: Diagnosis not present

## 2020-10-19 DIAGNOSIS — D631 Anemia in chronic kidney disease: Secondary | ICD-10-CM | POA: Diagnosis not present

## 2020-10-19 DIAGNOSIS — N2581 Secondary hyperparathyroidism of renal origin: Secondary | ICD-10-CM | POA: Diagnosis not present

## 2020-10-19 DIAGNOSIS — N186 End stage renal disease: Secondary | ICD-10-CM | POA: Diagnosis not present

## 2020-10-19 DIAGNOSIS — Z992 Dependence on renal dialysis: Secondary | ICD-10-CM | POA: Diagnosis not present

## 2020-10-21 DIAGNOSIS — N2581 Secondary hyperparathyroidism of renal origin: Secondary | ICD-10-CM | POA: Diagnosis not present

## 2020-10-21 DIAGNOSIS — D631 Anemia in chronic kidney disease: Secondary | ICD-10-CM | POA: Diagnosis not present

## 2020-10-21 DIAGNOSIS — Z992 Dependence on renal dialysis: Secondary | ICD-10-CM | POA: Diagnosis not present

## 2020-10-21 DIAGNOSIS — N186 End stage renal disease: Secondary | ICD-10-CM | POA: Diagnosis not present

## 2020-10-24 DIAGNOSIS — N2581 Secondary hyperparathyroidism of renal origin: Secondary | ICD-10-CM | POA: Diagnosis not present

## 2020-10-24 DIAGNOSIS — N186 End stage renal disease: Secondary | ICD-10-CM | POA: Diagnosis not present

## 2020-10-24 DIAGNOSIS — D631 Anemia in chronic kidney disease: Secondary | ICD-10-CM | POA: Diagnosis not present

## 2020-10-24 DIAGNOSIS — Z992 Dependence on renal dialysis: Secondary | ICD-10-CM | POA: Diagnosis not present

## 2020-10-26 DIAGNOSIS — D631 Anemia in chronic kidney disease: Secondary | ICD-10-CM | POA: Diagnosis not present

## 2020-10-26 DIAGNOSIS — N186 End stage renal disease: Secondary | ICD-10-CM | POA: Diagnosis not present

## 2020-10-26 DIAGNOSIS — N2581 Secondary hyperparathyroidism of renal origin: Secondary | ICD-10-CM | POA: Diagnosis not present

## 2020-10-26 DIAGNOSIS — Z992 Dependence on renal dialysis: Secondary | ICD-10-CM | POA: Diagnosis not present

## 2020-10-28 DIAGNOSIS — Z992 Dependence on renal dialysis: Secondary | ICD-10-CM | POA: Diagnosis not present

## 2020-10-28 DIAGNOSIS — N2581 Secondary hyperparathyroidism of renal origin: Secondary | ICD-10-CM | POA: Diagnosis not present

## 2020-10-28 DIAGNOSIS — N186 End stage renal disease: Secondary | ICD-10-CM | POA: Diagnosis not present

## 2020-10-28 DIAGNOSIS — D631 Anemia in chronic kidney disease: Secondary | ICD-10-CM | POA: Diagnosis not present

## 2020-10-31 DIAGNOSIS — I129 Hypertensive chronic kidney disease with stage 1 through stage 4 chronic kidney disease, or unspecified chronic kidney disease: Secondary | ICD-10-CM | POA: Diagnosis not present

## 2020-10-31 DIAGNOSIS — Z23 Encounter for immunization: Secondary | ICD-10-CM | POA: Diagnosis not present

## 2020-10-31 DIAGNOSIS — N2581 Secondary hyperparathyroidism of renal origin: Secondary | ICD-10-CM | POA: Diagnosis not present

## 2020-10-31 DIAGNOSIS — Z992 Dependence on renal dialysis: Secondary | ICD-10-CM | POA: Diagnosis not present

## 2020-10-31 DIAGNOSIS — D631 Anemia in chronic kidney disease: Secondary | ICD-10-CM | POA: Diagnosis not present

## 2020-10-31 DIAGNOSIS — D513 Other dietary vitamin B12 deficiency anemia: Secondary | ICD-10-CM | POA: Diagnosis not present

## 2020-10-31 DIAGNOSIS — N186 End stage renal disease: Secondary | ICD-10-CM | POA: Diagnosis not present

## 2020-11-02 DIAGNOSIS — N186 End stage renal disease: Secondary | ICD-10-CM | POA: Diagnosis not present

## 2020-11-02 DIAGNOSIS — Z23 Encounter for immunization: Secondary | ICD-10-CM | POA: Diagnosis not present

## 2020-11-02 DIAGNOSIS — D631 Anemia in chronic kidney disease: Secondary | ICD-10-CM | POA: Diagnosis not present

## 2020-11-02 DIAGNOSIS — Z992 Dependence on renal dialysis: Secondary | ICD-10-CM | POA: Diagnosis not present

## 2020-11-02 DIAGNOSIS — N2581 Secondary hyperparathyroidism of renal origin: Secondary | ICD-10-CM | POA: Diagnosis not present

## 2020-11-02 DIAGNOSIS — D513 Other dietary vitamin B12 deficiency anemia: Secondary | ICD-10-CM | POA: Diagnosis not present

## 2020-11-02 MED FILL — AMLODIPINE BESYLATE 10 MG T: 10 | 30 days supply | Qty: 30 | Fill #4

## 2020-11-02 MED FILL — CARVEDILOL 12.5 MG TABLET: 12.5 | 30 days supply | Qty: 60 | Fill #1

## 2020-11-02 MED FILL — LOVASTATIN 20 MG TABS: 20 | 90 days supply | Qty: 90 | Fill #1

## 2020-11-04 DIAGNOSIS — D513 Other dietary vitamin B12 deficiency anemia: Secondary | ICD-10-CM | POA: Diagnosis not present

## 2020-11-04 DIAGNOSIS — D631 Anemia in chronic kidney disease: Secondary | ICD-10-CM | POA: Diagnosis not present

## 2020-11-04 DIAGNOSIS — N186 End stage renal disease: Secondary | ICD-10-CM | POA: Diagnosis not present

## 2020-11-04 DIAGNOSIS — N2581 Secondary hyperparathyroidism of renal origin: Secondary | ICD-10-CM | POA: Diagnosis not present

## 2020-11-04 DIAGNOSIS — Z992 Dependence on renal dialysis: Secondary | ICD-10-CM | POA: Diagnosis not present

## 2020-11-04 DIAGNOSIS — Z23 Encounter for immunization: Secondary | ICD-10-CM | POA: Diagnosis not present

## 2020-11-07 DIAGNOSIS — Z23 Encounter for immunization: Secondary | ICD-10-CM | POA: Diagnosis not present

## 2020-11-07 DIAGNOSIS — Z992 Dependence on renal dialysis: Secondary | ICD-10-CM | POA: Diagnosis not present

## 2020-11-07 DIAGNOSIS — D513 Other dietary vitamin B12 deficiency anemia: Secondary | ICD-10-CM | POA: Diagnosis not present

## 2020-11-07 DIAGNOSIS — N2581 Secondary hyperparathyroidism of renal origin: Secondary | ICD-10-CM | POA: Diagnosis not present

## 2020-11-07 DIAGNOSIS — D631 Anemia in chronic kidney disease: Secondary | ICD-10-CM | POA: Diagnosis not present

## 2020-11-07 DIAGNOSIS — N186 End stage renal disease: Secondary | ICD-10-CM | POA: Diagnosis not present

## 2020-11-09 DIAGNOSIS — N2581 Secondary hyperparathyroidism of renal origin: Secondary | ICD-10-CM | POA: Diagnosis not present

## 2020-11-09 DIAGNOSIS — D513 Other dietary vitamin B12 deficiency anemia: Secondary | ICD-10-CM | POA: Diagnosis not present

## 2020-11-09 DIAGNOSIS — N186 End stage renal disease: Secondary | ICD-10-CM | POA: Diagnosis not present

## 2020-11-09 DIAGNOSIS — D631 Anemia in chronic kidney disease: Secondary | ICD-10-CM | POA: Diagnosis not present

## 2020-11-09 DIAGNOSIS — Z992 Dependence on renal dialysis: Secondary | ICD-10-CM | POA: Diagnosis not present

## 2020-11-09 DIAGNOSIS — Z23 Encounter for immunization: Secondary | ICD-10-CM | POA: Diagnosis not present

## 2020-11-11 DIAGNOSIS — N186 End stage renal disease: Secondary | ICD-10-CM | POA: Diagnosis not present

## 2020-11-11 DIAGNOSIS — D513 Other dietary vitamin B12 deficiency anemia: Secondary | ICD-10-CM | POA: Diagnosis not present

## 2020-11-11 DIAGNOSIS — N2581 Secondary hyperparathyroidism of renal origin: Secondary | ICD-10-CM | POA: Diagnosis not present

## 2020-11-11 DIAGNOSIS — D631 Anemia in chronic kidney disease: Secondary | ICD-10-CM | POA: Diagnosis not present

## 2020-11-11 DIAGNOSIS — Z23 Encounter for immunization: Secondary | ICD-10-CM | POA: Diagnosis not present

## 2020-11-11 DIAGNOSIS — Z992 Dependence on renal dialysis: Secondary | ICD-10-CM | POA: Diagnosis not present

## 2020-11-14 DIAGNOSIS — Z992 Dependence on renal dialysis: Secondary | ICD-10-CM | POA: Diagnosis not present

## 2020-11-14 DIAGNOSIS — Z23 Encounter for immunization: Secondary | ICD-10-CM | POA: Diagnosis not present

## 2020-11-14 DIAGNOSIS — N2581 Secondary hyperparathyroidism of renal origin: Secondary | ICD-10-CM | POA: Diagnosis not present

## 2020-11-14 DIAGNOSIS — D513 Other dietary vitamin B12 deficiency anemia: Secondary | ICD-10-CM | POA: Diagnosis not present

## 2020-11-14 DIAGNOSIS — D631 Anemia in chronic kidney disease: Secondary | ICD-10-CM | POA: Diagnosis not present

## 2020-11-14 DIAGNOSIS — N186 End stage renal disease: Secondary | ICD-10-CM | POA: Diagnosis not present

## 2020-11-16 DIAGNOSIS — N186 End stage renal disease: Secondary | ICD-10-CM | POA: Diagnosis not present

## 2020-11-16 DIAGNOSIS — D513 Other dietary vitamin B12 deficiency anemia: Secondary | ICD-10-CM | POA: Diagnosis not present

## 2020-11-16 DIAGNOSIS — Z992 Dependence on renal dialysis: Secondary | ICD-10-CM | POA: Diagnosis not present

## 2020-11-16 DIAGNOSIS — Z23 Encounter for immunization: Secondary | ICD-10-CM | POA: Diagnosis not present

## 2020-11-16 DIAGNOSIS — N2581 Secondary hyperparathyroidism of renal origin: Secondary | ICD-10-CM | POA: Diagnosis not present

## 2020-11-16 DIAGNOSIS — D631 Anemia in chronic kidney disease: Secondary | ICD-10-CM | POA: Diagnosis not present

## 2020-11-18 DIAGNOSIS — Z23 Encounter for immunization: Secondary | ICD-10-CM | POA: Diagnosis not present

## 2020-11-18 DIAGNOSIS — N186 End stage renal disease: Secondary | ICD-10-CM | POA: Diagnosis not present

## 2020-11-18 DIAGNOSIS — D513 Other dietary vitamin B12 deficiency anemia: Secondary | ICD-10-CM | POA: Diagnosis not present

## 2020-11-18 DIAGNOSIS — D631 Anemia in chronic kidney disease: Secondary | ICD-10-CM | POA: Diagnosis not present

## 2020-11-18 DIAGNOSIS — N2581 Secondary hyperparathyroidism of renal origin: Secondary | ICD-10-CM | POA: Diagnosis not present

## 2020-11-18 DIAGNOSIS — Z992 Dependence on renal dialysis: Secondary | ICD-10-CM | POA: Diagnosis not present

## 2020-11-21 DIAGNOSIS — Z23 Encounter for immunization: Secondary | ICD-10-CM | POA: Diagnosis not present

## 2020-11-21 DIAGNOSIS — Z992 Dependence on renal dialysis: Secondary | ICD-10-CM | POA: Diagnosis not present

## 2020-11-21 DIAGNOSIS — D631 Anemia in chronic kidney disease: Secondary | ICD-10-CM | POA: Diagnosis not present

## 2020-11-21 DIAGNOSIS — N2581 Secondary hyperparathyroidism of renal origin: Secondary | ICD-10-CM | POA: Diagnosis not present

## 2020-11-21 DIAGNOSIS — D513 Other dietary vitamin B12 deficiency anemia: Secondary | ICD-10-CM | POA: Diagnosis not present

## 2020-11-21 DIAGNOSIS — N186 End stage renal disease: Secondary | ICD-10-CM | POA: Diagnosis not present

## 2020-11-25 DIAGNOSIS — N2581 Secondary hyperparathyroidism of renal origin: Secondary | ICD-10-CM | POA: Diagnosis not present

## 2020-11-25 DIAGNOSIS — Z992 Dependence on renal dialysis: Secondary | ICD-10-CM | POA: Diagnosis not present

## 2020-11-25 DIAGNOSIS — Z23 Encounter for immunization: Secondary | ICD-10-CM | POA: Diagnosis not present

## 2020-11-25 DIAGNOSIS — N186 End stage renal disease: Secondary | ICD-10-CM | POA: Diagnosis not present

## 2020-11-25 DIAGNOSIS — D513 Other dietary vitamin B12 deficiency anemia: Secondary | ICD-10-CM | POA: Diagnosis not present

## 2020-11-25 DIAGNOSIS — D631 Anemia in chronic kidney disease: Secondary | ICD-10-CM | POA: Diagnosis not present

## 2020-11-28 DIAGNOSIS — Z23 Encounter for immunization: Secondary | ICD-10-CM | POA: Diagnosis not present

## 2020-11-28 DIAGNOSIS — Z992 Dependence on renal dialysis: Secondary | ICD-10-CM | POA: Diagnosis not present

## 2020-11-28 DIAGNOSIS — N2581 Secondary hyperparathyroidism of renal origin: Secondary | ICD-10-CM | POA: Diagnosis not present

## 2020-11-28 DIAGNOSIS — D513 Other dietary vitamin B12 deficiency anemia: Secondary | ICD-10-CM | POA: Diagnosis not present

## 2020-11-28 DIAGNOSIS — N186 End stage renal disease: Secondary | ICD-10-CM | POA: Diagnosis not present

## 2020-11-28 DIAGNOSIS — D631 Anemia in chronic kidney disease: Secondary | ICD-10-CM | POA: Diagnosis not present

## 2020-11-30 DIAGNOSIS — N186 End stage renal disease: Secondary | ICD-10-CM | POA: Diagnosis not present

## 2020-11-30 DIAGNOSIS — Z23 Encounter for immunization: Secondary | ICD-10-CM | POA: Diagnosis not present

## 2020-11-30 DIAGNOSIS — N2581 Secondary hyperparathyroidism of renal origin: Secondary | ICD-10-CM | POA: Diagnosis not present

## 2020-11-30 DIAGNOSIS — Z992 Dependence on renal dialysis: Secondary | ICD-10-CM | POA: Diagnosis not present

## 2020-11-30 DIAGNOSIS — D631 Anemia in chronic kidney disease: Secondary | ICD-10-CM | POA: Diagnosis not present

## 2020-11-30 DIAGNOSIS — D513 Other dietary vitamin B12 deficiency anemia: Secondary | ICD-10-CM | POA: Diagnosis not present

## 2020-12-01 DIAGNOSIS — I129 Hypertensive chronic kidney disease with stage 1 through stage 4 chronic kidney disease, or unspecified chronic kidney disease: Secondary | ICD-10-CM | POA: Diagnosis not present

## 2020-12-01 DIAGNOSIS — N186 End stage renal disease: Secondary | ICD-10-CM | POA: Diagnosis not present

## 2020-12-01 DIAGNOSIS — Z992 Dependence on renal dialysis: Secondary | ICD-10-CM | POA: Diagnosis not present

## 2020-12-02 DIAGNOSIS — D509 Iron deficiency anemia, unspecified: Secondary | ICD-10-CM | POA: Diagnosis not present

## 2020-12-02 DIAGNOSIS — N186 End stage renal disease: Secondary | ICD-10-CM | POA: Diagnosis not present

## 2020-12-02 DIAGNOSIS — N2581 Secondary hyperparathyroidism of renal origin: Secondary | ICD-10-CM | POA: Diagnosis not present

## 2020-12-02 DIAGNOSIS — D631 Anemia in chronic kidney disease: Secondary | ICD-10-CM | POA: Diagnosis not present

## 2020-12-02 DIAGNOSIS — Z992 Dependence on renal dialysis: Secondary | ICD-10-CM | POA: Diagnosis not present

## 2020-12-02 DIAGNOSIS — D513 Other dietary vitamin B12 deficiency anemia: Secondary | ICD-10-CM | POA: Diagnosis not present

## 2020-12-05 DIAGNOSIS — N2581 Secondary hyperparathyroidism of renal origin: Secondary | ICD-10-CM | POA: Diagnosis not present

## 2020-12-05 DIAGNOSIS — Z992 Dependence on renal dialysis: Secondary | ICD-10-CM | POA: Diagnosis not present

## 2020-12-05 DIAGNOSIS — D631 Anemia in chronic kidney disease: Secondary | ICD-10-CM | POA: Diagnosis not present

## 2020-12-05 DIAGNOSIS — D509 Iron deficiency anemia, unspecified: Secondary | ICD-10-CM | POA: Diagnosis not present

## 2020-12-05 DIAGNOSIS — D513 Other dietary vitamin B12 deficiency anemia: Secondary | ICD-10-CM | POA: Diagnosis not present

## 2020-12-05 DIAGNOSIS — N186 End stage renal disease: Secondary | ICD-10-CM | POA: Diagnosis not present

## 2020-12-07 DIAGNOSIS — D631 Anemia in chronic kidney disease: Secondary | ICD-10-CM | POA: Diagnosis not present

## 2020-12-07 DIAGNOSIS — N2581 Secondary hyperparathyroidism of renal origin: Secondary | ICD-10-CM | POA: Diagnosis not present

## 2020-12-07 DIAGNOSIS — D513 Other dietary vitamin B12 deficiency anemia: Secondary | ICD-10-CM | POA: Diagnosis not present

## 2020-12-07 DIAGNOSIS — Z992 Dependence on renal dialysis: Secondary | ICD-10-CM | POA: Diagnosis not present

## 2020-12-07 DIAGNOSIS — N186 End stage renal disease: Secondary | ICD-10-CM | POA: Diagnosis not present

## 2020-12-07 DIAGNOSIS — D509 Iron deficiency anemia, unspecified: Secondary | ICD-10-CM | POA: Diagnosis not present

## 2020-12-12 DIAGNOSIS — D513 Other dietary vitamin B12 deficiency anemia: Secondary | ICD-10-CM | POA: Diagnosis not present

## 2020-12-12 DIAGNOSIS — N186 End stage renal disease: Secondary | ICD-10-CM | POA: Diagnosis not present

## 2020-12-12 DIAGNOSIS — D509 Iron deficiency anemia, unspecified: Secondary | ICD-10-CM | POA: Diagnosis not present

## 2020-12-12 DIAGNOSIS — Z992 Dependence on renal dialysis: Secondary | ICD-10-CM | POA: Diagnosis not present

## 2020-12-12 DIAGNOSIS — N2581 Secondary hyperparathyroidism of renal origin: Secondary | ICD-10-CM | POA: Diagnosis not present

## 2020-12-12 DIAGNOSIS — D631 Anemia in chronic kidney disease: Secondary | ICD-10-CM | POA: Diagnosis not present

## 2020-12-14 DIAGNOSIS — E1129 Type 2 diabetes mellitus with other diabetic kidney complication: Secondary | ICD-10-CM | POA: Diagnosis not present

## 2020-12-14 DIAGNOSIS — D513 Other dietary vitamin B12 deficiency anemia: Secondary | ICD-10-CM | POA: Diagnosis not present

## 2020-12-14 DIAGNOSIS — Z992 Dependence on renal dialysis: Secondary | ICD-10-CM | POA: Diagnosis not present

## 2020-12-14 DIAGNOSIS — N2581 Secondary hyperparathyroidism of renal origin: Secondary | ICD-10-CM | POA: Diagnosis not present

## 2020-12-14 DIAGNOSIS — N186 End stage renal disease: Secondary | ICD-10-CM | POA: Diagnosis not present

## 2020-12-14 DIAGNOSIS — D509 Iron deficiency anemia, unspecified: Secondary | ICD-10-CM | POA: Diagnosis not present

## 2020-12-14 DIAGNOSIS — D631 Anemia in chronic kidney disease: Secondary | ICD-10-CM | POA: Diagnosis not present

## 2020-12-16 DIAGNOSIS — D509 Iron deficiency anemia, unspecified: Secondary | ICD-10-CM | POA: Diagnosis not present

## 2020-12-16 DIAGNOSIS — N186 End stage renal disease: Secondary | ICD-10-CM | POA: Diagnosis not present

## 2020-12-16 DIAGNOSIS — D513 Other dietary vitamin B12 deficiency anemia: Secondary | ICD-10-CM | POA: Diagnosis not present

## 2020-12-16 DIAGNOSIS — N2581 Secondary hyperparathyroidism of renal origin: Secondary | ICD-10-CM | POA: Diagnosis not present

## 2020-12-16 DIAGNOSIS — Z992 Dependence on renal dialysis: Secondary | ICD-10-CM | POA: Diagnosis not present

## 2020-12-16 DIAGNOSIS — D631 Anemia in chronic kidney disease: Secondary | ICD-10-CM | POA: Diagnosis not present

## 2020-12-19 DIAGNOSIS — D513 Other dietary vitamin B12 deficiency anemia: Secondary | ICD-10-CM | POA: Diagnosis not present

## 2020-12-19 DIAGNOSIS — D631 Anemia in chronic kidney disease: Secondary | ICD-10-CM | POA: Diagnosis not present

## 2020-12-19 DIAGNOSIS — D509 Iron deficiency anemia, unspecified: Secondary | ICD-10-CM | POA: Diagnosis not present

## 2020-12-19 DIAGNOSIS — N186 End stage renal disease: Secondary | ICD-10-CM | POA: Diagnosis not present

## 2020-12-19 DIAGNOSIS — N2581 Secondary hyperparathyroidism of renal origin: Secondary | ICD-10-CM | POA: Diagnosis not present

## 2020-12-19 DIAGNOSIS — Z992 Dependence on renal dialysis: Secondary | ICD-10-CM | POA: Diagnosis not present

## 2020-12-21 DIAGNOSIS — D509 Iron deficiency anemia, unspecified: Secondary | ICD-10-CM | POA: Diagnosis not present

## 2020-12-21 DIAGNOSIS — D631 Anemia in chronic kidney disease: Secondary | ICD-10-CM | POA: Diagnosis not present

## 2020-12-21 DIAGNOSIS — N186 End stage renal disease: Secondary | ICD-10-CM | POA: Diagnosis not present

## 2020-12-21 DIAGNOSIS — N2581 Secondary hyperparathyroidism of renal origin: Secondary | ICD-10-CM | POA: Diagnosis not present

## 2020-12-21 DIAGNOSIS — Z992 Dependence on renal dialysis: Secondary | ICD-10-CM | POA: Diagnosis not present

## 2020-12-21 DIAGNOSIS — D513 Other dietary vitamin B12 deficiency anemia: Secondary | ICD-10-CM | POA: Diagnosis not present

## 2020-12-23 DIAGNOSIS — D631 Anemia in chronic kidney disease: Secondary | ICD-10-CM | POA: Diagnosis not present

## 2020-12-23 DIAGNOSIS — Z992 Dependence on renal dialysis: Secondary | ICD-10-CM | POA: Diagnosis not present

## 2020-12-23 DIAGNOSIS — D513 Other dietary vitamin B12 deficiency anemia: Secondary | ICD-10-CM | POA: Diagnosis not present

## 2020-12-23 DIAGNOSIS — D509 Iron deficiency anemia, unspecified: Secondary | ICD-10-CM | POA: Diagnosis not present

## 2020-12-23 DIAGNOSIS — N2581 Secondary hyperparathyroidism of renal origin: Secondary | ICD-10-CM | POA: Diagnosis not present

## 2020-12-23 DIAGNOSIS — N186 End stage renal disease: Secondary | ICD-10-CM | POA: Diagnosis not present

## 2020-12-26 DIAGNOSIS — N186 End stage renal disease: Secondary | ICD-10-CM | POA: Diagnosis not present

## 2020-12-26 DIAGNOSIS — D513 Other dietary vitamin B12 deficiency anemia: Secondary | ICD-10-CM | POA: Diagnosis not present

## 2020-12-26 DIAGNOSIS — Z992 Dependence on renal dialysis: Secondary | ICD-10-CM | POA: Diagnosis not present

## 2020-12-26 DIAGNOSIS — N2581 Secondary hyperparathyroidism of renal origin: Secondary | ICD-10-CM | POA: Diagnosis not present

## 2020-12-26 DIAGNOSIS — D631 Anemia in chronic kidney disease: Secondary | ICD-10-CM | POA: Diagnosis not present

## 2020-12-26 DIAGNOSIS — D509 Iron deficiency anemia, unspecified: Secondary | ICD-10-CM | POA: Diagnosis not present

## 2020-12-28 DIAGNOSIS — D509 Iron deficiency anemia, unspecified: Secondary | ICD-10-CM | POA: Diagnosis not present

## 2020-12-28 DIAGNOSIS — Z992 Dependence on renal dialysis: Secondary | ICD-10-CM | POA: Diagnosis not present

## 2020-12-28 DIAGNOSIS — D513 Other dietary vitamin B12 deficiency anemia: Secondary | ICD-10-CM | POA: Diagnosis not present

## 2020-12-28 DIAGNOSIS — D631 Anemia in chronic kidney disease: Secondary | ICD-10-CM | POA: Diagnosis not present

## 2020-12-28 DIAGNOSIS — N2581 Secondary hyperparathyroidism of renal origin: Secondary | ICD-10-CM | POA: Diagnosis not present

## 2020-12-28 DIAGNOSIS — N186 End stage renal disease: Secondary | ICD-10-CM | POA: Diagnosis not present

## 2020-12-29 ENCOUNTER — Other Ambulatory Visit: Payer: Self-pay

## 2020-12-29 MED FILL — Amlodipine Besylate Tab 10 MG (Base Equivalent): ORAL | 30 days supply | Qty: 30 | Fill #0 | Status: AC

## 2020-12-29 MED FILL — Carvedilol Tab 12.5 MG: ORAL | 30 days supply | Qty: 60 | Fill #0 | Status: AC

## 2020-12-30 DIAGNOSIS — D631 Anemia in chronic kidney disease: Secondary | ICD-10-CM | POA: Diagnosis not present

## 2020-12-30 DIAGNOSIS — D513 Other dietary vitamin B12 deficiency anemia: Secondary | ICD-10-CM | POA: Diagnosis not present

## 2020-12-30 DIAGNOSIS — D509 Iron deficiency anemia, unspecified: Secondary | ICD-10-CM | POA: Diagnosis not present

## 2020-12-30 DIAGNOSIS — N186 End stage renal disease: Secondary | ICD-10-CM | POA: Diagnosis not present

## 2020-12-30 DIAGNOSIS — Z992 Dependence on renal dialysis: Secondary | ICD-10-CM | POA: Diagnosis not present

## 2020-12-30 DIAGNOSIS — N2581 Secondary hyperparathyroidism of renal origin: Secondary | ICD-10-CM | POA: Diagnosis not present

## 2020-12-31 DIAGNOSIS — Z992 Dependence on renal dialysis: Secondary | ICD-10-CM | POA: Diagnosis not present

## 2020-12-31 DIAGNOSIS — I129 Hypertensive chronic kidney disease with stage 1 through stage 4 chronic kidney disease, or unspecified chronic kidney disease: Secondary | ICD-10-CM | POA: Diagnosis not present

## 2020-12-31 DIAGNOSIS — N186 End stage renal disease: Secondary | ICD-10-CM | POA: Diagnosis not present

## 2021-01-01 ENCOUNTER — Other Ambulatory Visit: Payer: Self-pay

## 2021-01-02 ENCOUNTER — Other Ambulatory Visit: Payer: Self-pay

## 2021-01-02 DIAGNOSIS — D513 Other dietary vitamin B12 deficiency anemia: Secondary | ICD-10-CM | POA: Diagnosis not present

## 2021-01-02 DIAGNOSIS — N2581 Secondary hyperparathyroidism of renal origin: Secondary | ICD-10-CM | POA: Diagnosis not present

## 2021-01-02 DIAGNOSIS — D509 Iron deficiency anemia, unspecified: Secondary | ICD-10-CM | POA: Diagnosis not present

## 2021-01-02 DIAGNOSIS — D631 Anemia in chronic kidney disease: Secondary | ICD-10-CM | POA: Diagnosis not present

## 2021-01-02 DIAGNOSIS — N186 End stage renal disease: Secondary | ICD-10-CM | POA: Diagnosis not present

## 2021-01-02 DIAGNOSIS — Z992 Dependence on renal dialysis: Secondary | ICD-10-CM | POA: Diagnosis not present

## 2021-01-04 DIAGNOSIS — N186 End stage renal disease: Secondary | ICD-10-CM | POA: Diagnosis not present

## 2021-01-04 DIAGNOSIS — D513 Other dietary vitamin B12 deficiency anemia: Secondary | ICD-10-CM | POA: Diagnosis not present

## 2021-01-04 DIAGNOSIS — N2581 Secondary hyperparathyroidism of renal origin: Secondary | ICD-10-CM | POA: Diagnosis not present

## 2021-01-04 DIAGNOSIS — D509 Iron deficiency anemia, unspecified: Secondary | ICD-10-CM | POA: Diagnosis not present

## 2021-01-04 DIAGNOSIS — Z992 Dependence on renal dialysis: Secondary | ICD-10-CM | POA: Diagnosis not present

## 2021-01-04 DIAGNOSIS — D631 Anemia in chronic kidney disease: Secondary | ICD-10-CM | POA: Diagnosis not present

## 2021-01-06 DIAGNOSIS — N186 End stage renal disease: Secondary | ICD-10-CM | POA: Diagnosis not present

## 2021-01-06 DIAGNOSIS — D509 Iron deficiency anemia, unspecified: Secondary | ICD-10-CM | POA: Diagnosis not present

## 2021-01-06 DIAGNOSIS — N2581 Secondary hyperparathyroidism of renal origin: Secondary | ICD-10-CM | POA: Diagnosis not present

## 2021-01-06 DIAGNOSIS — D513 Other dietary vitamin B12 deficiency anemia: Secondary | ICD-10-CM | POA: Diagnosis not present

## 2021-01-06 DIAGNOSIS — D631 Anemia in chronic kidney disease: Secondary | ICD-10-CM | POA: Diagnosis not present

## 2021-01-06 DIAGNOSIS — Z992 Dependence on renal dialysis: Secondary | ICD-10-CM | POA: Diagnosis not present

## 2021-01-09 DIAGNOSIS — D631 Anemia in chronic kidney disease: Secondary | ICD-10-CM | POA: Diagnosis not present

## 2021-01-09 DIAGNOSIS — Z992 Dependence on renal dialysis: Secondary | ICD-10-CM | POA: Diagnosis not present

## 2021-01-09 DIAGNOSIS — D509 Iron deficiency anemia, unspecified: Secondary | ICD-10-CM | POA: Diagnosis not present

## 2021-01-09 DIAGNOSIS — D513 Other dietary vitamin B12 deficiency anemia: Secondary | ICD-10-CM | POA: Diagnosis not present

## 2021-01-09 DIAGNOSIS — N186 End stage renal disease: Secondary | ICD-10-CM | POA: Diagnosis not present

## 2021-01-09 DIAGNOSIS — N2581 Secondary hyperparathyroidism of renal origin: Secondary | ICD-10-CM | POA: Diagnosis not present

## 2021-01-11 DIAGNOSIS — D513 Other dietary vitamin B12 deficiency anemia: Secondary | ICD-10-CM | POA: Diagnosis not present

## 2021-01-11 DIAGNOSIS — N186 End stage renal disease: Secondary | ICD-10-CM | POA: Diagnosis not present

## 2021-01-11 DIAGNOSIS — D509 Iron deficiency anemia, unspecified: Secondary | ICD-10-CM | POA: Diagnosis not present

## 2021-01-11 DIAGNOSIS — N2581 Secondary hyperparathyroidism of renal origin: Secondary | ICD-10-CM | POA: Diagnosis not present

## 2021-01-11 DIAGNOSIS — Z992 Dependence on renal dialysis: Secondary | ICD-10-CM | POA: Diagnosis not present

## 2021-01-11 DIAGNOSIS — D631 Anemia in chronic kidney disease: Secondary | ICD-10-CM | POA: Diagnosis not present

## 2021-01-13 DIAGNOSIS — N2581 Secondary hyperparathyroidism of renal origin: Secondary | ICD-10-CM | POA: Diagnosis not present

## 2021-01-13 DIAGNOSIS — D513 Other dietary vitamin B12 deficiency anemia: Secondary | ICD-10-CM | POA: Diagnosis not present

## 2021-01-13 DIAGNOSIS — D509 Iron deficiency anemia, unspecified: Secondary | ICD-10-CM | POA: Diagnosis not present

## 2021-01-13 DIAGNOSIS — D631 Anemia in chronic kidney disease: Secondary | ICD-10-CM | POA: Diagnosis not present

## 2021-01-13 DIAGNOSIS — Z992 Dependence on renal dialysis: Secondary | ICD-10-CM | POA: Diagnosis not present

## 2021-01-13 DIAGNOSIS — N186 End stage renal disease: Secondary | ICD-10-CM | POA: Diagnosis not present

## 2021-01-16 DIAGNOSIS — Z992 Dependence on renal dialysis: Secondary | ICD-10-CM | POA: Diagnosis not present

## 2021-01-16 DIAGNOSIS — D631 Anemia in chronic kidney disease: Secondary | ICD-10-CM | POA: Diagnosis not present

## 2021-01-16 DIAGNOSIS — N186 End stage renal disease: Secondary | ICD-10-CM | POA: Diagnosis not present

## 2021-01-16 DIAGNOSIS — D509 Iron deficiency anemia, unspecified: Secondary | ICD-10-CM | POA: Diagnosis not present

## 2021-01-16 DIAGNOSIS — D513 Other dietary vitamin B12 deficiency anemia: Secondary | ICD-10-CM | POA: Diagnosis not present

## 2021-01-16 DIAGNOSIS — N2581 Secondary hyperparathyroidism of renal origin: Secondary | ICD-10-CM | POA: Diagnosis not present

## 2021-01-18 DIAGNOSIS — Z992 Dependence on renal dialysis: Secondary | ICD-10-CM | POA: Diagnosis not present

## 2021-01-18 DIAGNOSIS — D513 Other dietary vitamin B12 deficiency anemia: Secondary | ICD-10-CM | POA: Diagnosis not present

## 2021-01-18 DIAGNOSIS — D631 Anemia in chronic kidney disease: Secondary | ICD-10-CM | POA: Diagnosis not present

## 2021-01-18 DIAGNOSIS — N2581 Secondary hyperparathyroidism of renal origin: Secondary | ICD-10-CM | POA: Diagnosis not present

## 2021-01-18 DIAGNOSIS — D509 Iron deficiency anemia, unspecified: Secondary | ICD-10-CM | POA: Diagnosis not present

## 2021-01-18 DIAGNOSIS — N186 End stage renal disease: Secondary | ICD-10-CM | POA: Diagnosis not present

## 2021-01-20 DIAGNOSIS — D509 Iron deficiency anemia, unspecified: Secondary | ICD-10-CM | POA: Diagnosis not present

## 2021-01-20 DIAGNOSIS — D513 Other dietary vitamin B12 deficiency anemia: Secondary | ICD-10-CM | POA: Diagnosis not present

## 2021-01-20 DIAGNOSIS — N186 End stage renal disease: Secondary | ICD-10-CM | POA: Diagnosis not present

## 2021-01-20 DIAGNOSIS — D631 Anemia in chronic kidney disease: Secondary | ICD-10-CM | POA: Diagnosis not present

## 2021-01-20 DIAGNOSIS — Z992 Dependence on renal dialysis: Secondary | ICD-10-CM | POA: Diagnosis not present

## 2021-01-20 DIAGNOSIS — N2581 Secondary hyperparathyroidism of renal origin: Secondary | ICD-10-CM | POA: Diagnosis not present

## 2021-01-23 DIAGNOSIS — D631 Anemia in chronic kidney disease: Secondary | ICD-10-CM | POA: Diagnosis not present

## 2021-01-23 DIAGNOSIS — D513 Other dietary vitamin B12 deficiency anemia: Secondary | ICD-10-CM | POA: Diagnosis not present

## 2021-01-23 DIAGNOSIS — N186 End stage renal disease: Secondary | ICD-10-CM | POA: Diagnosis not present

## 2021-01-23 DIAGNOSIS — Z992 Dependence on renal dialysis: Secondary | ICD-10-CM | POA: Diagnosis not present

## 2021-01-23 DIAGNOSIS — N2581 Secondary hyperparathyroidism of renal origin: Secondary | ICD-10-CM | POA: Diagnosis not present

## 2021-01-23 DIAGNOSIS — D509 Iron deficiency anemia, unspecified: Secondary | ICD-10-CM | POA: Diagnosis not present

## 2021-01-27 DIAGNOSIS — D509 Iron deficiency anemia, unspecified: Secondary | ICD-10-CM | POA: Diagnosis not present

## 2021-01-27 DIAGNOSIS — N2581 Secondary hyperparathyroidism of renal origin: Secondary | ICD-10-CM | POA: Diagnosis not present

## 2021-01-27 DIAGNOSIS — Z992 Dependence on renal dialysis: Secondary | ICD-10-CM | POA: Diagnosis not present

## 2021-01-27 DIAGNOSIS — D513 Other dietary vitamin B12 deficiency anemia: Secondary | ICD-10-CM | POA: Diagnosis not present

## 2021-01-27 DIAGNOSIS — D631 Anemia in chronic kidney disease: Secondary | ICD-10-CM | POA: Diagnosis not present

## 2021-01-27 DIAGNOSIS — N186 End stage renal disease: Secondary | ICD-10-CM | POA: Diagnosis not present

## 2021-01-30 DIAGNOSIS — Z992 Dependence on renal dialysis: Secondary | ICD-10-CM | POA: Diagnosis not present

## 2021-01-30 DIAGNOSIS — D631 Anemia in chronic kidney disease: Secondary | ICD-10-CM | POA: Diagnosis not present

## 2021-01-30 DIAGNOSIS — N186 End stage renal disease: Secondary | ICD-10-CM | POA: Diagnosis not present

## 2021-01-30 DIAGNOSIS — D509 Iron deficiency anemia, unspecified: Secondary | ICD-10-CM | POA: Diagnosis not present

## 2021-01-30 DIAGNOSIS — N2581 Secondary hyperparathyroidism of renal origin: Secondary | ICD-10-CM | POA: Diagnosis not present

## 2021-01-30 DIAGNOSIS — D513 Other dietary vitamin B12 deficiency anemia: Secondary | ICD-10-CM | POA: Diagnosis not present

## 2021-01-31 DIAGNOSIS — N186 End stage renal disease: Secondary | ICD-10-CM | POA: Diagnosis not present

## 2021-01-31 DIAGNOSIS — Z992 Dependence on renal dialysis: Secondary | ICD-10-CM | POA: Diagnosis not present

## 2021-01-31 DIAGNOSIS — I129 Hypertensive chronic kidney disease with stage 1 through stage 4 chronic kidney disease, or unspecified chronic kidney disease: Secondary | ICD-10-CM | POA: Diagnosis not present

## 2021-02-01 DIAGNOSIS — D513 Other dietary vitamin B12 deficiency anemia: Secondary | ICD-10-CM | POA: Diagnosis not present

## 2021-02-01 DIAGNOSIS — D631 Anemia in chronic kidney disease: Secondary | ICD-10-CM | POA: Diagnosis not present

## 2021-02-01 DIAGNOSIS — N2581 Secondary hyperparathyroidism of renal origin: Secondary | ICD-10-CM | POA: Diagnosis not present

## 2021-02-01 DIAGNOSIS — Z992 Dependence on renal dialysis: Secondary | ICD-10-CM | POA: Diagnosis not present

## 2021-02-01 DIAGNOSIS — N186 End stage renal disease: Secondary | ICD-10-CM | POA: Diagnosis not present

## 2021-02-06 DIAGNOSIS — N186 End stage renal disease: Secondary | ICD-10-CM | POA: Diagnosis not present

## 2021-02-06 DIAGNOSIS — Z992 Dependence on renal dialysis: Secondary | ICD-10-CM | POA: Diagnosis not present

## 2021-02-06 DIAGNOSIS — N2581 Secondary hyperparathyroidism of renal origin: Secondary | ICD-10-CM | POA: Diagnosis not present

## 2021-02-06 DIAGNOSIS — D513 Other dietary vitamin B12 deficiency anemia: Secondary | ICD-10-CM | POA: Diagnosis not present

## 2021-02-06 DIAGNOSIS — D631 Anemia in chronic kidney disease: Secondary | ICD-10-CM | POA: Diagnosis not present

## 2021-02-07 ENCOUNTER — Telehealth: Payer: Self-pay | Admitting: Internal Medicine

## 2021-02-07 NOTE — Telephone Encounter (Signed)
Copied from [place] [phone]. Topic: General - Other >> Feb 06, 2021  1:00 PM Keene Breath wrote: Reason for CRM: Patient's wife called to request a letter from the doctor stating that the patient is still disabled which he needs for his medicaid application.  Please advise and call to discuss further.  CB# [phone]  Called patient and LVM advising I was calling from [facility] in regards to scheduling an appointment. Per nurse okay to schedule tele in an 810 or 130 slot. If patient calls back please transfer to office for scheduling.

## 2021-02-08 DIAGNOSIS — D513 Other dietary vitamin B12 deficiency anemia: Secondary | ICD-10-CM | POA: Diagnosis not present

## 2021-02-08 DIAGNOSIS — Z992 Dependence on renal dialysis: Secondary | ICD-10-CM | POA: Diagnosis not present

## 2021-02-08 DIAGNOSIS — N2581 Secondary hyperparathyroidism of renal origin: Secondary | ICD-10-CM | POA: Diagnosis not present

## 2021-02-08 DIAGNOSIS — N186 End stage renal disease: Secondary | ICD-10-CM | POA: Diagnosis not present

## 2021-02-08 DIAGNOSIS — D631 Anemia in chronic kidney disease: Secondary | ICD-10-CM | POA: Diagnosis not present

## 2021-02-10 DIAGNOSIS — N2581 Secondary hyperparathyroidism of renal origin: Secondary | ICD-10-CM | POA: Diagnosis not present

## 2021-02-10 DIAGNOSIS — N186 End stage renal disease: Secondary | ICD-10-CM | POA: Diagnosis not present

## 2021-02-10 DIAGNOSIS — D513 Other dietary vitamin B12 deficiency anemia: Secondary | ICD-10-CM | POA: Diagnosis not present

## 2021-02-10 DIAGNOSIS — D631 Anemia in chronic kidney disease: Secondary | ICD-10-CM | POA: Diagnosis not present

## 2021-02-10 DIAGNOSIS — Z992 Dependence on renal dialysis: Secondary | ICD-10-CM | POA: Diagnosis not present

## 2021-02-13 DIAGNOSIS — N186 End stage renal disease: Secondary | ICD-10-CM | POA: Diagnosis not present

## 2021-02-13 DIAGNOSIS — D631 Anemia in chronic kidney disease: Secondary | ICD-10-CM | POA: Diagnosis not present

## 2021-02-13 DIAGNOSIS — D513 Other dietary vitamin B12 deficiency anemia: Secondary | ICD-10-CM | POA: Diagnosis not present

## 2021-02-13 DIAGNOSIS — N2581 Secondary hyperparathyroidism of renal origin: Secondary | ICD-10-CM | POA: Diagnosis not present

## 2021-02-13 DIAGNOSIS — Z992 Dependence on renal dialysis: Secondary | ICD-10-CM | POA: Diagnosis not present

## 2021-02-15 DIAGNOSIS — D631 Anemia in chronic kidney disease: Secondary | ICD-10-CM | POA: Diagnosis not present

## 2021-02-15 DIAGNOSIS — D513 Other dietary vitamin B12 deficiency anemia: Secondary | ICD-10-CM | POA: Diagnosis not present

## 2021-02-15 DIAGNOSIS — N186 End stage renal disease: Secondary | ICD-10-CM | POA: Diagnosis not present

## 2021-02-15 DIAGNOSIS — Z992 Dependence on renal dialysis: Secondary | ICD-10-CM | POA: Diagnosis not present

## 2021-02-15 DIAGNOSIS — N2581 Secondary hyperparathyroidism of renal origin: Secondary | ICD-10-CM | POA: Diagnosis not present

## 2021-02-17 DIAGNOSIS — N186 End stage renal disease: Secondary | ICD-10-CM | POA: Diagnosis not present

## 2021-02-17 DIAGNOSIS — Z992 Dependence on renal dialysis: Secondary | ICD-10-CM | POA: Diagnosis not present

## 2021-02-17 DIAGNOSIS — N2581 Secondary hyperparathyroidism of renal origin: Secondary | ICD-10-CM | POA: Diagnosis not present

## 2021-02-17 DIAGNOSIS — D513 Other dietary vitamin B12 deficiency anemia: Secondary | ICD-10-CM | POA: Diagnosis not present

## 2021-02-17 DIAGNOSIS — D631 Anemia in chronic kidney disease: Secondary | ICD-10-CM | POA: Diagnosis not present

## 2021-02-20 DIAGNOSIS — D513 Other dietary vitamin B12 deficiency anemia: Secondary | ICD-10-CM | POA: Diagnosis not present

## 2021-02-20 DIAGNOSIS — Z992 Dependence on renal dialysis: Secondary | ICD-10-CM | POA: Diagnosis not present

## 2021-02-20 DIAGNOSIS — N2581 Secondary hyperparathyroidism of renal origin: Secondary | ICD-10-CM | POA: Diagnosis not present

## 2021-02-20 DIAGNOSIS — N186 End stage renal disease: Secondary | ICD-10-CM | POA: Diagnosis not present

## 2021-02-20 DIAGNOSIS — D631 Anemia in chronic kidney disease: Secondary | ICD-10-CM | POA: Diagnosis not present

## 2021-02-22 DIAGNOSIS — N186 End stage renal disease: Secondary | ICD-10-CM | POA: Diagnosis not present

## 2021-02-22 DIAGNOSIS — N2581 Secondary hyperparathyroidism of renal origin: Secondary | ICD-10-CM | POA: Diagnosis not present

## 2021-02-22 DIAGNOSIS — Z992 Dependence on renal dialysis: Secondary | ICD-10-CM | POA: Diagnosis not present

## 2021-02-22 DIAGNOSIS — D631 Anemia in chronic kidney disease: Secondary | ICD-10-CM | POA: Diagnosis not present

## 2021-02-22 DIAGNOSIS — D513 Other dietary vitamin B12 deficiency anemia: Secondary | ICD-10-CM | POA: Diagnosis not present

## 2021-02-24 DIAGNOSIS — D631 Anemia in chronic kidney disease: Secondary | ICD-10-CM | POA: Diagnosis not present

## 2021-02-24 DIAGNOSIS — D513 Other dietary vitamin B12 deficiency anemia: Secondary | ICD-10-CM | POA: Diagnosis not present

## 2021-02-24 DIAGNOSIS — Z992 Dependence on renal dialysis: Secondary | ICD-10-CM | POA: Diagnosis not present

## 2021-02-24 DIAGNOSIS — N2581 Secondary hyperparathyroidism of renal origin: Secondary | ICD-10-CM | POA: Diagnosis not present

## 2021-02-24 DIAGNOSIS — N186 End stage renal disease: Secondary | ICD-10-CM | POA: Diagnosis not present

## 2021-02-27 DIAGNOSIS — N186 End stage renal disease: Secondary | ICD-10-CM | POA: Diagnosis not present

## 2021-02-27 DIAGNOSIS — D513 Other dietary vitamin B12 deficiency anemia: Secondary | ICD-10-CM | POA: Diagnosis not present

## 2021-02-27 DIAGNOSIS — N2581 Secondary hyperparathyroidism of renal origin: Secondary | ICD-10-CM | POA: Diagnosis not present

## 2021-02-27 DIAGNOSIS — Z992 Dependence on renal dialysis: Secondary | ICD-10-CM | POA: Diagnosis not present

## 2021-02-27 DIAGNOSIS — D631 Anemia in chronic kidney disease: Secondary | ICD-10-CM | POA: Diagnosis not present

## 2021-03-01 DIAGNOSIS — N186 End stage renal disease: Secondary | ICD-10-CM | POA: Diagnosis not present

## 2021-03-01 DIAGNOSIS — N2581 Secondary hyperparathyroidism of renal origin: Secondary | ICD-10-CM | POA: Diagnosis not present

## 2021-03-01 DIAGNOSIS — Z992 Dependence on renal dialysis: Secondary | ICD-10-CM | POA: Diagnosis not present

## 2021-03-01 DIAGNOSIS — D631 Anemia in chronic kidney disease: Secondary | ICD-10-CM | POA: Diagnosis not present

## 2021-03-01 DIAGNOSIS — D513 Other dietary vitamin B12 deficiency anemia: Secondary | ICD-10-CM | POA: Diagnosis not present

## 2021-03-02 DIAGNOSIS — N186 End stage renal disease: Secondary | ICD-10-CM | POA: Diagnosis not present

## 2021-03-02 DIAGNOSIS — I129 Hypertensive chronic kidney disease with stage 1 through stage 4 chronic kidney disease, or unspecified chronic kidney disease: Secondary | ICD-10-CM | POA: Diagnosis not present

## 2021-03-02 DIAGNOSIS — Z992 Dependence on renal dialysis: Secondary | ICD-10-CM | POA: Diagnosis not present

## 2021-03-06 DIAGNOSIS — D631 Anemia in chronic kidney disease: Secondary | ICD-10-CM | POA: Diagnosis not present

## 2021-03-06 DIAGNOSIS — N2581 Secondary hyperparathyroidism of renal origin: Secondary | ICD-10-CM | POA: Diagnosis not present

## 2021-03-06 DIAGNOSIS — N186 End stage renal disease: Secondary | ICD-10-CM | POA: Diagnosis not present

## 2021-03-06 DIAGNOSIS — D513 Other dietary vitamin B12 deficiency anemia: Secondary | ICD-10-CM | POA: Diagnosis not present

## 2021-03-06 DIAGNOSIS — Z992 Dependence on renal dialysis: Secondary | ICD-10-CM | POA: Diagnosis not present

## 2021-03-08 DIAGNOSIS — Z992 Dependence on renal dialysis: Secondary | ICD-10-CM | POA: Diagnosis not present

## 2021-03-08 DIAGNOSIS — N2581 Secondary hyperparathyroidism of renal origin: Secondary | ICD-10-CM | POA: Diagnosis not present

## 2021-03-08 DIAGNOSIS — N186 End stage renal disease: Secondary | ICD-10-CM | POA: Diagnosis not present

## 2021-03-08 DIAGNOSIS — D631 Anemia in chronic kidney disease: Secondary | ICD-10-CM | POA: Diagnosis not present

## 2021-03-08 DIAGNOSIS — D513 Other dietary vitamin B12 deficiency anemia: Secondary | ICD-10-CM | POA: Diagnosis not present

## 2021-03-10 DIAGNOSIS — D513 Other dietary vitamin B12 deficiency anemia: Secondary | ICD-10-CM | POA: Diagnosis not present

## 2021-03-10 DIAGNOSIS — N186 End stage renal disease: Secondary | ICD-10-CM | POA: Diagnosis not present

## 2021-03-10 DIAGNOSIS — D631 Anemia in chronic kidney disease: Secondary | ICD-10-CM | POA: Diagnosis not present

## 2021-03-10 DIAGNOSIS — N2581 Secondary hyperparathyroidism of renal origin: Secondary | ICD-10-CM | POA: Diagnosis not present

## 2021-03-10 DIAGNOSIS — Z992 Dependence on renal dialysis: Secondary | ICD-10-CM | POA: Diagnosis not present

## 2021-03-13 DIAGNOSIS — N186 End stage renal disease: Secondary | ICD-10-CM | POA: Diagnosis not present

## 2021-03-13 DIAGNOSIS — Z992 Dependence on renal dialysis: Secondary | ICD-10-CM | POA: Diagnosis not present

## 2021-03-13 DIAGNOSIS — D513 Other dietary vitamin B12 deficiency anemia: Secondary | ICD-10-CM | POA: Diagnosis not present

## 2021-03-13 DIAGNOSIS — D631 Anemia in chronic kidney disease: Secondary | ICD-10-CM | POA: Diagnosis not present

## 2021-03-13 DIAGNOSIS — N2581 Secondary hyperparathyroidism of renal origin: Secondary | ICD-10-CM | POA: Diagnosis not present

## 2021-03-20 DIAGNOSIS — N2581 Secondary hyperparathyroidism of renal origin: Secondary | ICD-10-CM | POA: Diagnosis not present

## 2021-03-20 DIAGNOSIS — E1129 Type 2 diabetes mellitus with other diabetic kidney complication: Secondary | ICD-10-CM | POA: Diagnosis not present

## 2021-03-20 DIAGNOSIS — N186 End stage renal disease: Secondary | ICD-10-CM | POA: Diagnosis not present

## 2021-03-20 DIAGNOSIS — D513 Other dietary vitamin B12 deficiency anemia: Secondary | ICD-10-CM | POA: Diagnosis not present

## 2021-03-20 DIAGNOSIS — D631 Anemia in chronic kidney disease: Secondary | ICD-10-CM | POA: Diagnosis not present

## 2021-03-20 DIAGNOSIS — Z992 Dependence on renal dialysis: Secondary | ICD-10-CM | POA: Diagnosis not present

## 2021-03-22 DIAGNOSIS — N2581 Secondary hyperparathyroidism of renal origin: Secondary | ICD-10-CM | POA: Diagnosis not present

## 2021-03-22 DIAGNOSIS — Z992 Dependence on renal dialysis: Secondary | ICD-10-CM | POA: Diagnosis not present

## 2021-03-22 DIAGNOSIS — N186 End stage renal disease: Secondary | ICD-10-CM | POA: Diagnosis not present

## 2021-03-22 DIAGNOSIS — D631 Anemia in chronic kidney disease: Secondary | ICD-10-CM | POA: Diagnosis not present

## 2021-03-22 DIAGNOSIS — D513 Other dietary vitamin B12 deficiency anemia: Secondary | ICD-10-CM | POA: Diagnosis not present

## 2021-03-24 DIAGNOSIS — N2581 Secondary hyperparathyroidism of renal origin: Secondary | ICD-10-CM | POA: Diagnosis not present

## 2021-03-24 DIAGNOSIS — Z992 Dependence on renal dialysis: Secondary | ICD-10-CM | POA: Diagnosis not present

## 2021-03-24 DIAGNOSIS — D513 Other dietary vitamin B12 deficiency anemia: Secondary | ICD-10-CM | POA: Diagnosis not present

## 2021-03-24 DIAGNOSIS — N186 End stage renal disease: Secondary | ICD-10-CM | POA: Diagnosis not present

## 2021-03-24 DIAGNOSIS — D631 Anemia in chronic kidney disease: Secondary | ICD-10-CM | POA: Diagnosis not present

## 2021-03-27 DIAGNOSIS — N2581 Secondary hyperparathyroidism of renal origin: Secondary | ICD-10-CM | POA: Diagnosis not present

## 2021-03-27 DIAGNOSIS — Z992 Dependence on renal dialysis: Secondary | ICD-10-CM | POA: Diagnosis not present

## 2021-03-27 DIAGNOSIS — D513 Other dietary vitamin B12 deficiency anemia: Secondary | ICD-10-CM | POA: Diagnosis not present

## 2021-03-27 DIAGNOSIS — D631 Anemia in chronic kidney disease: Secondary | ICD-10-CM | POA: Diagnosis not present

## 2021-03-27 DIAGNOSIS — N186 End stage renal disease: Secondary | ICD-10-CM | POA: Diagnosis not present

## 2021-03-28 ENCOUNTER — Other Ambulatory Visit: Payer: Self-pay

## 2021-03-28 ENCOUNTER — Other Ambulatory Visit: Payer: Self-pay | Admitting: Internal Medicine

## 2021-03-28 DIAGNOSIS — I10 Essential (primary) hypertension: Secondary | ICD-10-CM

## 2021-03-28 MED FILL — Lovastatin Tab 20 MG: ORAL | 30 days supply | Qty: 30 | Fill #0 | Status: CN

## 2021-03-28 MED FILL — Carvedilol Tab 12.5 MG: ORAL | 30 days supply | Qty: 60 | Fill #1 | Status: CN

## 2021-03-28 NOTE — Telephone Encounter (Signed)
Requested medication (s) are due for refill today: no  Requested medication (s) are on the active medication list:  yes  Last refill:  01/01/2021  Future visit scheduled: no  Notes to clinic:  overdue for follow up appointment Vm left for patient to callback and schedule    Requested Prescriptions  Pending Prescriptions Disp Refills   amLODipine (NORVASC) 10 MG tablet 30 tablet 5    Sig: TAKE 1 TABLET (10 MG TOTAL) BY MOUTH DAILY.      Cardiovascular:  Calcium Channel Blockers Failed - 03/28/2021 10:52 AM      Failed - Last BP in normal range    BP Readings from Last 1 Encounters:  03/27/20 (!) 181/103          Failed - Valid encounter within last 6 months    Recent Outpatient Visits           11 months ago Controlled type 2 diabetes mellitus with other diabetic kidney complication, with long-term current use of insulin ([place])   [place] Ladell Pier, MD   1 year ago Hospital discharge follow-up   [place], MD   1 year ago ESRD (end stage renal disease) [facility])   [place], MD   1 year ago ESRD (end stage renal disease) [facility])   [place], Deborah B, MD   1 year ago Essential hypertension   [place], [place], RPH-CPP

## 2021-03-31 DIAGNOSIS — D513 Other dietary vitamin B12 deficiency anemia: Secondary | ICD-10-CM | POA: Diagnosis not present

## 2021-03-31 DIAGNOSIS — Z992 Dependence on renal dialysis: Secondary | ICD-10-CM | POA: Diagnosis not present

## 2021-03-31 DIAGNOSIS — N2581 Secondary hyperparathyroidism of renal origin: Secondary | ICD-10-CM | POA: Diagnosis not present

## 2021-03-31 DIAGNOSIS — D631 Anemia in chronic kidney disease: Secondary | ICD-10-CM | POA: Diagnosis not present

## 2021-03-31 DIAGNOSIS — N186 End stage renal disease: Secondary | ICD-10-CM | POA: Diagnosis not present

## 2021-04-02 DIAGNOSIS — I129 Hypertensive chronic kidney disease with stage 1 through stage 4 chronic kidney disease, or unspecified chronic kidney disease: Secondary | ICD-10-CM | POA: Diagnosis not present

## 2021-04-02 DIAGNOSIS — N186 End stage renal disease: Secondary | ICD-10-CM | POA: Diagnosis not present

## 2021-04-02 DIAGNOSIS — Z992 Dependence on renal dialysis: Secondary | ICD-10-CM | POA: Diagnosis not present

## 2021-04-03 DIAGNOSIS — D513 Other dietary vitamin B12 deficiency anemia: Secondary | ICD-10-CM | POA: Diagnosis not present

## 2021-04-03 DIAGNOSIS — Z992 Dependence on renal dialysis: Secondary | ICD-10-CM | POA: Diagnosis not present

## 2021-04-03 DIAGNOSIS — N186 End stage renal disease: Secondary | ICD-10-CM | POA: Diagnosis not present

## 2021-04-03 DIAGNOSIS — N2581 Secondary hyperparathyroidism of renal origin: Secondary | ICD-10-CM | POA: Diagnosis not present

## 2021-04-03 DIAGNOSIS — D631 Anemia in chronic kidney disease: Secondary | ICD-10-CM | POA: Diagnosis not present

## 2021-04-04 ENCOUNTER — Other Ambulatory Visit: Payer: Self-pay

## 2021-04-05 DIAGNOSIS — N2581 Secondary hyperparathyroidism of renal origin: Secondary | ICD-10-CM | POA: Diagnosis not present

## 2021-04-05 DIAGNOSIS — D513 Other dietary vitamin B12 deficiency anemia: Secondary | ICD-10-CM | POA: Diagnosis not present

## 2021-04-05 DIAGNOSIS — Z992 Dependence on renal dialysis: Secondary | ICD-10-CM | POA: Diagnosis not present

## 2021-04-05 DIAGNOSIS — D631 Anemia in chronic kidney disease: Secondary | ICD-10-CM | POA: Diagnosis not present

## 2021-04-05 DIAGNOSIS — N186 End stage renal disease: Secondary | ICD-10-CM | POA: Diagnosis not present

## 2021-04-07 DIAGNOSIS — D631 Anemia in chronic kidney disease: Secondary | ICD-10-CM | POA: Diagnosis not present

## 2021-04-07 DIAGNOSIS — N186 End stage renal disease: Secondary | ICD-10-CM | POA: Diagnosis not present

## 2021-04-07 DIAGNOSIS — D513 Other dietary vitamin B12 deficiency anemia: Secondary | ICD-10-CM | POA: Diagnosis not present

## 2021-04-07 DIAGNOSIS — Z992 Dependence on renal dialysis: Secondary | ICD-10-CM | POA: Diagnosis not present

## 2021-04-07 DIAGNOSIS — N2581 Secondary hyperparathyroidism of renal origin: Secondary | ICD-10-CM | POA: Diagnosis not present

## 2021-04-10 DIAGNOSIS — N186 End stage renal disease: Secondary | ICD-10-CM | POA: Diagnosis not present

## 2021-04-10 DIAGNOSIS — D513 Other dietary vitamin B12 deficiency anemia: Secondary | ICD-10-CM | POA: Diagnosis not present

## 2021-04-10 DIAGNOSIS — Z992 Dependence on renal dialysis: Secondary | ICD-10-CM | POA: Diagnosis not present

## 2021-04-10 DIAGNOSIS — N2581 Secondary hyperparathyroidism of renal origin: Secondary | ICD-10-CM | POA: Diagnosis not present

## 2021-04-10 DIAGNOSIS — D631 Anemia in chronic kidney disease: Secondary | ICD-10-CM | POA: Diagnosis not present

## 2021-04-11 ENCOUNTER — Other Ambulatory Visit: Payer: Self-pay

## 2021-04-11 MED FILL — Carvedilol Tab 12.5 MG: ORAL | 30 days supply | Qty: 60 | Fill #1 | Status: AC

## 2021-04-11 MED FILL — Lovastatin Tab 20 MG: ORAL | 30 days supply | Qty: 30 | Fill #0 | Status: AC

## 2021-04-14 DIAGNOSIS — D513 Other dietary vitamin B12 deficiency anemia: Secondary | ICD-10-CM | POA: Diagnosis not present

## 2021-04-14 DIAGNOSIS — N186 End stage renal disease: Secondary | ICD-10-CM | POA: Diagnosis not present

## 2021-04-14 DIAGNOSIS — D631 Anemia in chronic kidney disease: Secondary | ICD-10-CM | POA: Diagnosis not present

## 2021-04-14 DIAGNOSIS — N2581 Secondary hyperparathyroidism of renal origin: Secondary | ICD-10-CM | POA: Diagnosis not present

## 2021-04-14 DIAGNOSIS — Z992 Dependence on renal dialysis: Secondary | ICD-10-CM | POA: Diagnosis not present

## 2021-04-17 DIAGNOSIS — D631 Anemia in chronic kidney disease: Secondary | ICD-10-CM | POA: Diagnosis not present

## 2021-04-17 DIAGNOSIS — N186 End stage renal disease: Secondary | ICD-10-CM | POA: Diagnosis not present

## 2021-04-17 DIAGNOSIS — D513 Other dietary vitamin B12 deficiency anemia: Secondary | ICD-10-CM | POA: Diagnosis not present

## 2021-04-17 DIAGNOSIS — N2581 Secondary hyperparathyroidism of renal origin: Secondary | ICD-10-CM | POA: Diagnosis not present

## 2021-04-17 DIAGNOSIS — Z992 Dependence on renal dialysis: Secondary | ICD-10-CM | POA: Diagnosis not present

## 2021-04-19 DIAGNOSIS — Z992 Dependence on renal dialysis: Secondary | ICD-10-CM | POA: Diagnosis not present

## 2021-04-19 DIAGNOSIS — N2581 Secondary hyperparathyroidism of renal origin: Secondary | ICD-10-CM | POA: Diagnosis not present

## 2021-04-19 DIAGNOSIS — N186 End stage renal disease: Secondary | ICD-10-CM | POA: Diagnosis not present

## 2021-04-19 DIAGNOSIS — D513 Other dietary vitamin B12 deficiency anemia: Secondary | ICD-10-CM | POA: Diagnosis not present

## 2021-04-19 DIAGNOSIS — D631 Anemia in chronic kidney disease: Secondary | ICD-10-CM | POA: Diagnosis not present

## 2021-04-21 DIAGNOSIS — Z992 Dependence on renal dialysis: Secondary | ICD-10-CM | POA: Diagnosis not present

## 2021-04-21 DIAGNOSIS — N186 End stage renal disease: Secondary | ICD-10-CM | POA: Diagnosis not present

## 2021-04-21 DIAGNOSIS — D631 Anemia in chronic kidney disease: Secondary | ICD-10-CM | POA: Diagnosis not present

## 2021-04-21 DIAGNOSIS — N2581 Secondary hyperparathyroidism of renal origin: Secondary | ICD-10-CM | POA: Diagnosis not present

## 2021-04-21 DIAGNOSIS — D513 Other dietary vitamin B12 deficiency anemia: Secondary | ICD-10-CM | POA: Diagnosis not present

## 2021-04-24 IMAGING — DX DG CHEST 1V PORT
1 series · 1 of 1 positions shown · non-contrast
Comparison: Radiograph earlier today.

CLINICAL DATA: Endotracheal tube placement. COVID positive 8 days
ago.

EXAM:
PORTABLE CHEST 1 VIEW

[chest ap]
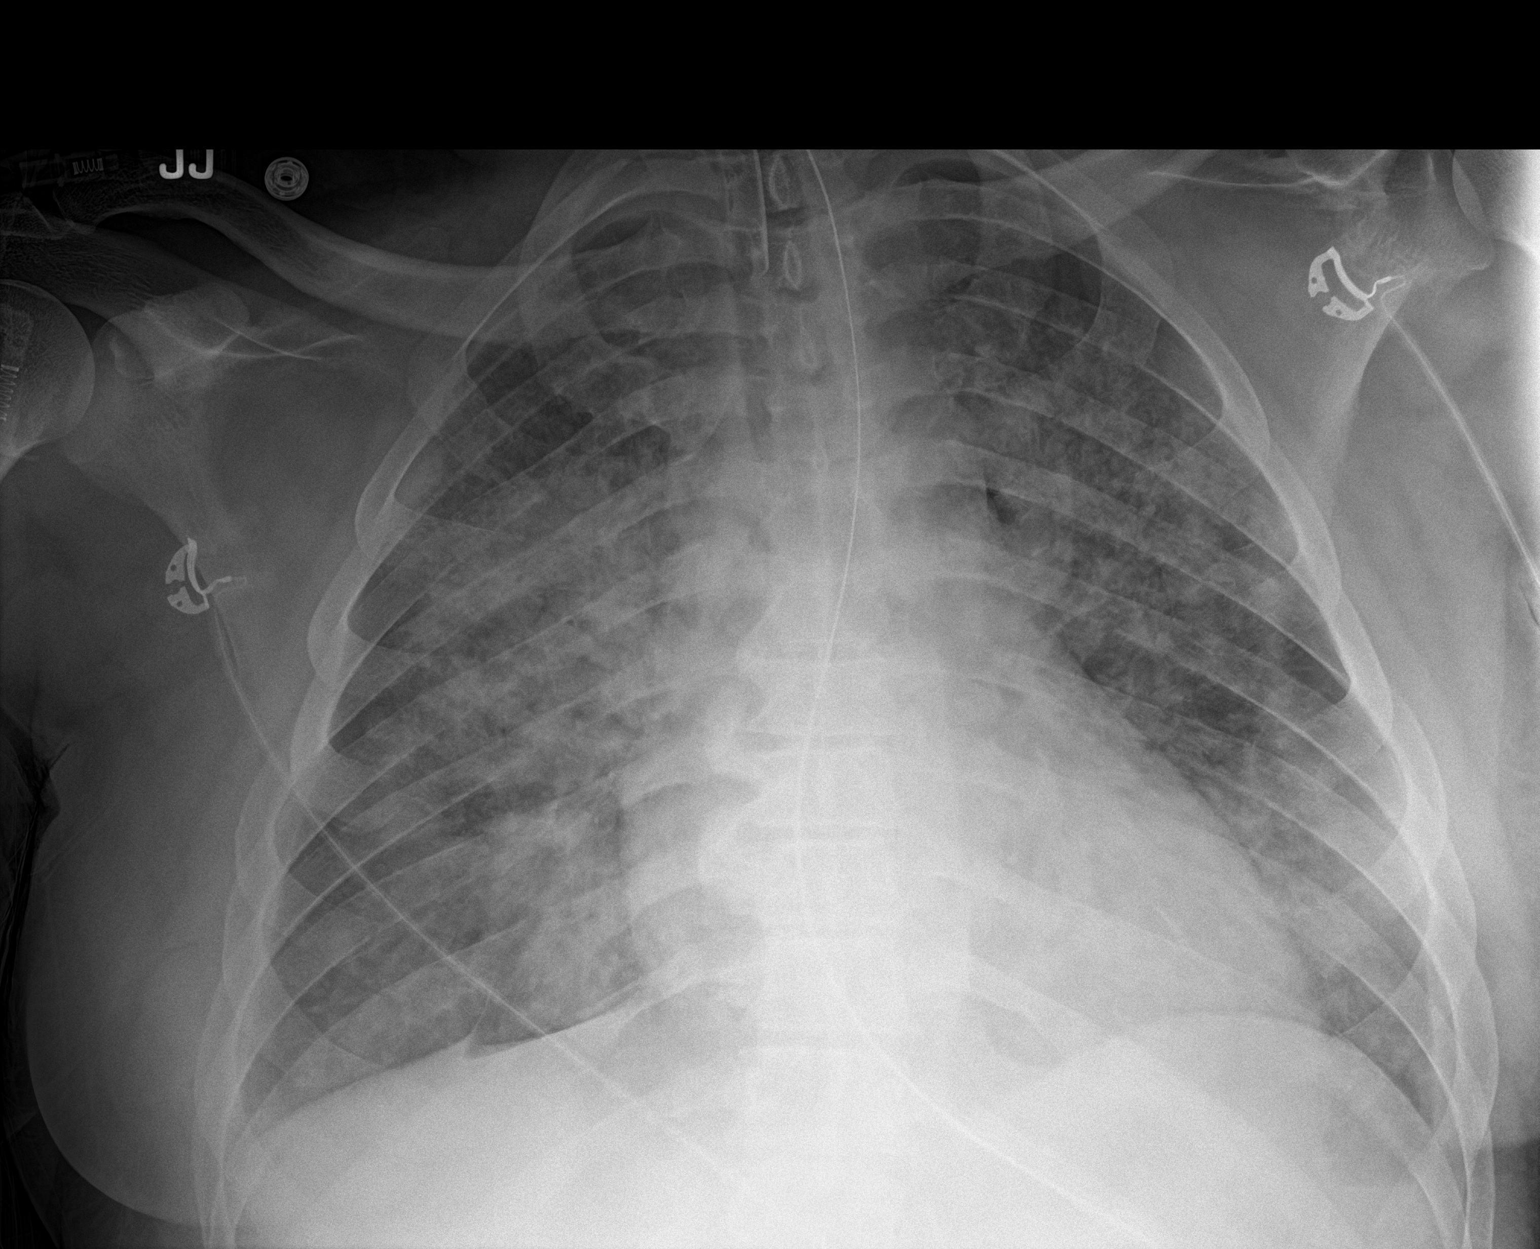

[1 of 1 positions shown; findings below may reference images not displayed]

FINDINGS: Endotracheal tube tip 8 cm from the carina at the level of the
clavicular heads. Enteric tube in place with tip below the
diaphragm. Similar cardiomegaly. Bilateral pulmonary opacities with
a slight perihilar predominance, improved aeration of the left lung
base, otherwise unchanged. No pneumothorax. Small pleural effusions,
improved on the left. No acute osseous abnormalities are seen.
IMPRESSION: 1. Endotracheal tube tip 8 cm from the carina at the level of the
clavicular heads.
2. Heterogeneous bilateral pulmonary opacities, may represent
multifocal pneumonia in the setting of COVID versus pulmonary edema,
or combination thereof. There is improved left basilar aeration from
exam earlier today.
3. Unchanged cardiomegaly.  Small pleural effusions.

## 2021-04-24 IMAGING — DX DG ABDOMEN 1V
1 series · 1 of 1 positions shown · non-contrast
Comparison: Radiograph 11/16/2019

CLINICAL DATA: Feeding tube placement.

EXAM:
ABDOMEN - 1 VIEW

[chest ap]
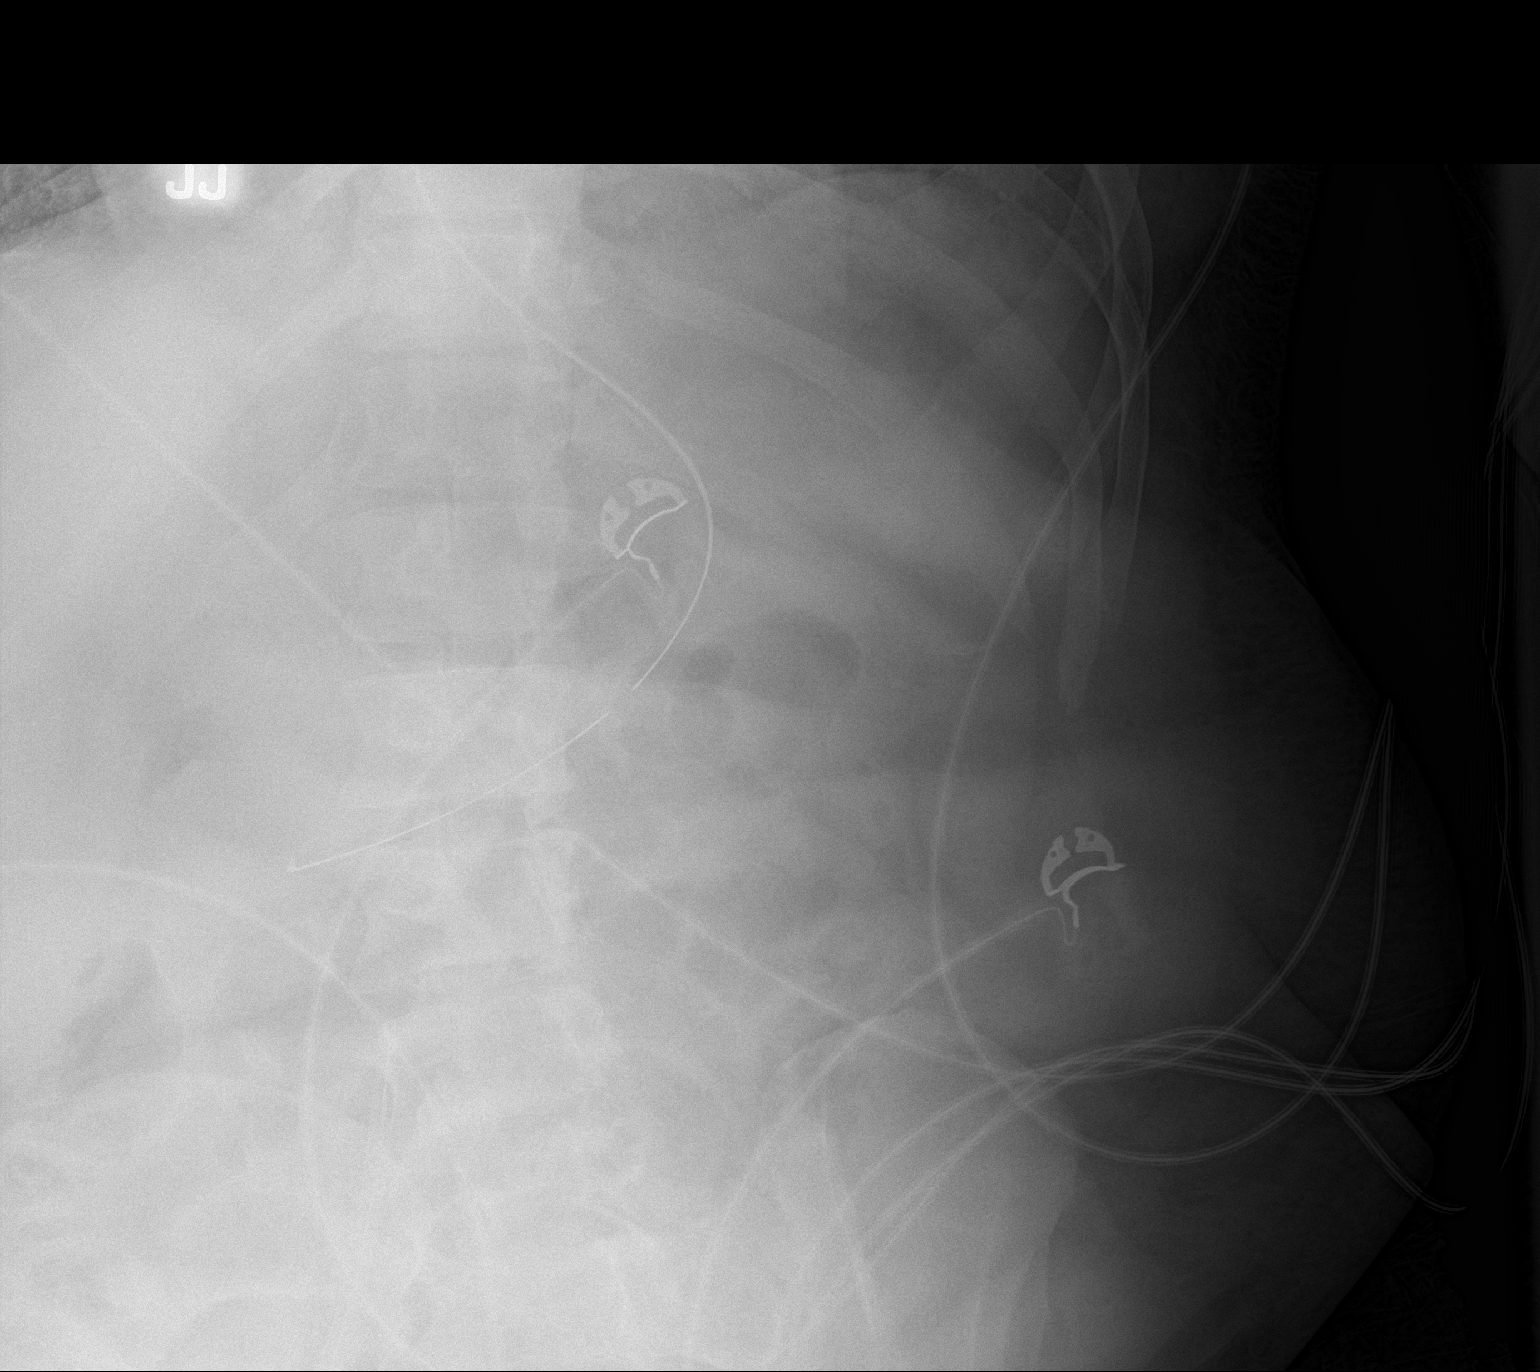

[1 of 1 positions shown; findings below may reference images not displayed]

FINDINGS: Tip and side port of the enteric tube below the diaphragm in the
stomach. Nonobstructive bowel gas pattern. Right femoral catheter
tip is in the region of the lower IVC.
IMPRESSION: Tip and side port of the enteric tube below the diaphragm in the
stomach.

## 2021-04-26 DIAGNOSIS — Z992 Dependence on renal dialysis: Secondary | ICD-10-CM | POA: Diagnosis not present

## 2021-04-26 DIAGNOSIS — N186 End stage renal disease: Secondary | ICD-10-CM | POA: Diagnosis not present

## 2021-04-26 DIAGNOSIS — D631 Anemia in chronic kidney disease: Secondary | ICD-10-CM | POA: Diagnosis not present

## 2021-04-26 DIAGNOSIS — N2581 Secondary hyperparathyroidism of renal origin: Secondary | ICD-10-CM | POA: Diagnosis not present

## 2021-04-26 DIAGNOSIS — D513 Other dietary vitamin B12 deficiency anemia: Secondary | ICD-10-CM | POA: Diagnosis not present

## 2021-04-28 DIAGNOSIS — N186 End stage renal disease: Secondary | ICD-10-CM | POA: Diagnosis not present

## 2021-04-28 DIAGNOSIS — N2581 Secondary hyperparathyroidism of renal origin: Secondary | ICD-10-CM | POA: Diagnosis not present

## 2021-04-28 DIAGNOSIS — Z992 Dependence on renal dialysis: Secondary | ICD-10-CM | POA: Diagnosis not present

## 2021-04-28 DIAGNOSIS — D513 Other dietary vitamin B12 deficiency anemia: Secondary | ICD-10-CM | POA: Diagnosis not present

## 2021-04-28 DIAGNOSIS — D631 Anemia in chronic kidney disease: Secondary | ICD-10-CM | POA: Diagnosis not present

## 2021-05-01 ENCOUNTER — Other Ambulatory Visit: Payer: Self-pay

## 2021-05-01 DIAGNOSIS — N2581 Secondary hyperparathyroidism of renal origin: Secondary | ICD-10-CM | POA: Diagnosis not present

## 2021-05-01 DIAGNOSIS — Z992 Dependence on renal dialysis: Secondary | ICD-10-CM | POA: Diagnosis not present

## 2021-05-01 DIAGNOSIS — D513 Other dietary vitamin B12 deficiency anemia: Secondary | ICD-10-CM | POA: Diagnosis not present

## 2021-05-01 DIAGNOSIS — D631 Anemia in chronic kidney disease: Secondary | ICD-10-CM | POA: Diagnosis not present

## 2021-05-01 DIAGNOSIS — N186 End stage renal disease: Secondary | ICD-10-CM | POA: Diagnosis not present

## 2021-05-01 MED ORDER — AMLODIPINE BESYLATE 10 MG PO TABS
10.0000 mg | ORAL_TABLET | Freq: Every day | ORAL | 3 refills | Status: DC
  Filled 2021-05-01: qty 90, 90d supply, fill #0

## 2021-05-02 ENCOUNTER — Other Ambulatory Visit: Payer: Self-pay

## 2021-05-03 DIAGNOSIS — I129 Hypertensive chronic kidney disease with stage 1 through stage 4 chronic kidney disease, or unspecified chronic kidney disease: Secondary | ICD-10-CM | POA: Diagnosis not present

## 2021-05-03 DIAGNOSIS — N186 End stage renal disease: Secondary | ICD-10-CM | POA: Diagnosis not present

## 2021-05-03 DIAGNOSIS — Z992 Dependence on renal dialysis: Secondary | ICD-10-CM | POA: Diagnosis not present

## 2021-05-05 DIAGNOSIS — D513 Other dietary vitamin B12 deficiency anemia: Secondary | ICD-10-CM | POA: Diagnosis not present

## 2021-05-05 DIAGNOSIS — D509 Iron deficiency anemia, unspecified: Secondary | ICD-10-CM | POA: Diagnosis not present

## 2021-05-05 DIAGNOSIS — N186 End stage renal disease: Secondary | ICD-10-CM | POA: Diagnosis not present

## 2021-05-05 DIAGNOSIS — D631 Anemia in chronic kidney disease: Secondary | ICD-10-CM | POA: Diagnosis not present

## 2021-05-05 DIAGNOSIS — N2581 Secondary hyperparathyroidism of renal origin: Secondary | ICD-10-CM | POA: Diagnosis not present

## 2021-05-05 DIAGNOSIS — Z992 Dependence on renal dialysis: Secondary | ICD-10-CM | POA: Diagnosis not present

## 2021-05-08 DIAGNOSIS — Z992 Dependence on renal dialysis: Secondary | ICD-10-CM | POA: Diagnosis not present

## 2021-05-08 DIAGNOSIS — N186 End stage renal disease: Secondary | ICD-10-CM | POA: Diagnosis not present

## 2021-05-08 DIAGNOSIS — D631 Anemia in chronic kidney disease: Secondary | ICD-10-CM | POA: Diagnosis not present

## 2021-05-08 DIAGNOSIS — N2581 Secondary hyperparathyroidism of renal origin: Secondary | ICD-10-CM | POA: Diagnosis not present

## 2021-05-08 DIAGNOSIS — D513 Other dietary vitamin B12 deficiency anemia: Secondary | ICD-10-CM | POA: Diagnosis not present

## 2021-05-08 DIAGNOSIS — D509 Iron deficiency anemia, unspecified: Secondary | ICD-10-CM | POA: Diagnosis not present

## 2021-05-10 DIAGNOSIS — Z992 Dependence on renal dialysis: Secondary | ICD-10-CM | POA: Diagnosis not present

## 2021-05-10 DIAGNOSIS — D509 Iron deficiency anemia, unspecified: Secondary | ICD-10-CM | POA: Diagnosis not present

## 2021-05-10 DIAGNOSIS — D631 Anemia in chronic kidney disease: Secondary | ICD-10-CM | POA: Diagnosis not present

## 2021-05-10 DIAGNOSIS — D513 Other dietary vitamin B12 deficiency anemia: Secondary | ICD-10-CM | POA: Diagnosis not present

## 2021-05-10 DIAGNOSIS — N186 End stage renal disease: Secondary | ICD-10-CM | POA: Diagnosis not present

## 2021-05-10 DIAGNOSIS — N2581 Secondary hyperparathyroidism of renal origin: Secondary | ICD-10-CM | POA: Diagnosis not present

## 2021-05-15 DIAGNOSIS — N186 End stage renal disease: Secondary | ICD-10-CM | POA: Diagnosis not present

## 2021-05-15 DIAGNOSIS — D509 Iron deficiency anemia, unspecified: Secondary | ICD-10-CM | POA: Diagnosis not present

## 2021-05-15 DIAGNOSIS — Z992 Dependence on renal dialysis: Secondary | ICD-10-CM | POA: Diagnosis not present

## 2021-05-15 DIAGNOSIS — N2581 Secondary hyperparathyroidism of renal origin: Secondary | ICD-10-CM | POA: Diagnosis not present

## 2021-05-15 DIAGNOSIS — D631 Anemia in chronic kidney disease: Secondary | ICD-10-CM | POA: Diagnosis not present

## 2021-05-15 DIAGNOSIS — D513 Other dietary vitamin B12 deficiency anemia: Secondary | ICD-10-CM | POA: Diagnosis not present

## 2021-05-17 DIAGNOSIS — D513 Other dietary vitamin B12 deficiency anemia: Secondary | ICD-10-CM | POA: Diagnosis not present

## 2021-05-17 DIAGNOSIS — D509 Iron deficiency anemia, unspecified: Secondary | ICD-10-CM | POA: Diagnosis not present

## 2021-05-17 DIAGNOSIS — D631 Anemia in chronic kidney disease: Secondary | ICD-10-CM | POA: Diagnosis not present

## 2021-05-17 DIAGNOSIS — N186 End stage renal disease: Secondary | ICD-10-CM | POA: Diagnosis not present

## 2021-05-17 DIAGNOSIS — N2581 Secondary hyperparathyroidism of renal origin: Secondary | ICD-10-CM | POA: Diagnosis not present

## 2021-05-17 DIAGNOSIS — Z992 Dependence on renal dialysis: Secondary | ICD-10-CM | POA: Diagnosis not present

## 2021-05-22 DIAGNOSIS — D513 Other dietary vitamin B12 deficiency anemia: Secondary | ICD-10-CM | POA: Diagnosis not present

## 2021-05-22 DIAGNOSIS — N186 End stage renal disease: Secondary | ICD-10-CM | POA: Diagnosis not present

## 2021-05-22 DIAGNOSIS — N2581 Secondary hyperparathyroidism of renal origin: Secondary | ICD-10-CM | POA: Diagnosis not present

## 2021-05-22 DIAGNOSIS — Z992 Dependence on renal dialysis: Secondary | ICD-10-CM | POA: Diagnosis not present

## 2021-05-22 DIAGNOSIS — D631 Anemia in chronic kidney disease: Secondary | ICD-10-CM | POA: Diagnosis not present

## 2021-05-22 DIAGNOSIS — D509 Iron deficiency anemia, unspecified: Secondary | ICD-10-CM | POA: Diagnosis not present

## 2021-05-24 DIAGNOSIS — N2581 Secondary hyperparathyroidism of renal origin: Secondary | ICD-10-CM | POA: Diagnosis not present

## 2021-05-24 DIAGNOSIS — Z992 Dependence on renal dialysis: Secondary | ICD-10-CM | POA: Diagnosis not present

## 2021-05-24 DIAGNOSIS — N186 End stage renal disease: Secondary | ICD-10-CM | POA: Diagnosis not present

## 2021-05-24 DIAGNOSIS — D509 Iron deficiency anemia, unspecified: Secondary | ICD-10-CM | POA: Diagnosis not present

## 2021-05-24 DIAGNOSIS — D631 Anemia in chronic kidney disease: Secondary | ICD-10-CM | POA: Diagnosis not present

## 2021-05-24 DIAGNOSIS — D513 Other dietary vitamin B12 deficiency anemia: Secondary | ICD-10-CM | POA: Diagnosis not present

## 2021-05-26 DIAGNOSIS — D509 Iron deficiency anemia, unspecified: Secondary | ICD-10-CM | POA: Diagnosis not present

## 2021-05-26 DIAGNOSIS — D631 Anemia in chronic kidney disease: Secondary | ICD-10-CM | POA: Diagnosis not present

## 2021-05-26 DIAGNOSIS — Z992 Dependence on renal dialysis: Secondary | ICD-10-CM | POA: Diagnosis not present

## 2021-05-26 DIAGNOSIS — N2581 Secondary hyperparathyroidism of renal origin: Secondary | ICD-10-CM | POA: Diagnosis not present

## 2021-05-26 DIAGNOSIS — N186 End stage renal disease: Secondary | ICD-10-CM | POA: Diagnosis not present

## 2021-05-26 DIAGNOSIS — D513 Other dietary vitamin B12 deficiency anemia: Secondary | ICD-10-CM | POA: Diagnosis not present

## 2021-05-29 DIAGNOSIS — N186 End stage renal disease: Secondary | ICD-10-CM | POA: Diagnosis not present

## 2021-05-29 DIAGNOSIS — D509 Iron deficiency anemia, unspecified: Secondary | ICD-10-CM | POA: Diagnosis not present

## 2021-05-29 DIAGNOSIS — D513 Other dietary vitamin B12 deficiency anemia: Secondary | ICD-10-CM | POA: Diagnosis not present

## 2021-05-29 DIAGNOSIS — D631 Anemia in chronic kidney disease: Secondary | ICD-10-CM | POA: Diagnosis not present

## 2021-05-29 DIAGNOSIS — N2581 Secondary hyperparathyroidism of renal origin: Secondary | ICD-10-CM | POA: Diagnosis not present

## 2021-05-29 DIAGNOSIS — Z992 Dependence on renal dialysis: Secondary | ICD-10-CM | POA: Diagnosis not present

## 2021-05-31 DIAGNOSIS — N2581 Secondary hyperparathyroidism of renal origin: Secondary | ICD-10-CM | POA: Diagnosis not present

## 2021-05-31 DIAGNOSIS — D631 Anemia in chronic kidney disease: Secondary | ICD-10-CM | POA: Diagnosis not present

## 2021-05-31 DIAGNOSIS — Z992 Dependence on renal dialysis: Secondary | ICD-10-CM | POA: Diagnosis not present

## 2021-05-31 DIAGNOSIS — D509 Iron deficiency anemia, unspecified: Secondary | ICD-10-CM | POA: Diagnosis not present

## 2021-05-31 DIAGNOSIS — N186 End stage renal disease: Secondary | ICD-10-CM | POA: Diagnosis not present

## 2021-05-31 DIAGNOSIS — D513 Other dietary vitamin B12 deficiency anemia: Secondary | ICD-10-CM | POA: Diagnosis not present

## 2021-06-02 DIAGNOSIS — Z992 Dependence on renal dialysis: Secondary | ICD-10-CM | POA: Diagnosis not present

## 2021-06-02 DIAGNOSIS — N186 End stage renal disease: Secondary | ICD-10-CM | POA: Diagnosis not present

## 2021-06-02 DIAGNOSIS — I129 Hypertensive chronic kidney disease with stage 1 through stage 4 chronic kidney disease, or unspecified chronic kidney disease: Secondary | ICD-10-CM | POA: Diagnosis not present

## 2021-06-05 DIAGNOSIS — D631 Anemia in chronic kidney disease: Secondary | ICD-10-CM | POA: Diagnosis not present

## 2021-06-05 DIAGNOSIS — N186 End stage renal disease: Secondary | ICD-10-CM | POA: Diagnosis not present

## 2021-06-05 DIAGNOSIS — D513 Other dietary vitamin B12 deficiency anemia: Secondary | ICD-10-CM | POA: Diagnosis not present

## 2021-06-05 DIAGNOSIS — Z992 Dependence on renal dialysis: Secondary | ICD-10-CM | POA: Diagnosis not present

## 2021-06-05 DIAGNOSIS — N2581 Secondary hyperparathyroidism of renal origin: Secondary | ICD-10-CM | POA: Diagnosis not present

## 2021-06-07 DIAGNOSIS — Z992 Dependence on renal dialysis: Secondary | ICD-10-CM | POA: Diagnosis not present

## 2021-06-07 DIAGNOSIS — N186 End stage renal disease: Secondary | ICD-10-CM | POA: Diagnosis not present

## 2021-06-07 DIAGNOSIS — N2581 Secondary hyperparathyroidism of renal origin: Secondary | ICD-10-CM | POA: Diagnosis not present

## 2021-06-07 DIAGNOSIS — D513 Other dietary vitamin B12 deficiency anemia: Secondary | ICD-10-CM | POA: Diagnosis not present

## 2021-06-07 DIAGNOSIS — D631 Anemia in chronic kidney disease: Secondary | ICD-10-CM | POA: Diagnosis not present

## 2021-06-09 DIAGNOSIS — Z992 Dependence on renal dialysis: Secondary | ICD-10-CM | POA: Diagnosis not present

## 2021-06-09 DIAGNOSIS — N186 End stage renal disease: Secondary | ICD-10-CM | POA: Diagnosis not present

## 2021-06-09 DIAGNOSIS — D631 Anemia in chronic kidney disease: Secondary | ICD-10-CM | POA: Diagnosis not present

## 2021-06-09 DIAGNOSIS — N2581 Secondary hyperparathyroidism of renal origin: Secondary | ICD-10-CM | POA: Diagnosis not present

## 2021-06-09 DIAGNOSIS — D513 Other dietary vitamin B12 deficiency anemia: Secondary | ICD-10-CM | POA: Diagnosis not present

## 2021-06-12 DIAGNOSIS — D513 Other dietary vitamin B12 deficiency anemia: Secondary | ICD-10-CM | POA: Diagnosis not present

## 2021-06-12 DIAGNOSIS — N186 End stage renal disease: Secondary | ICD-10-CM | POA: Diagnosis not present

## 2021-06-12 DIAGNOSIS — N2581 Secondary hyperparathyroidism of renal origin: Secondary | ICD-10-CM | POA: Diagnosis not present

## 2021-06-12 DIAGNOSIS — Z992 Dependence on renal dialysis: Secondary | ICD-10-CM | POA: Diagnosis not present

## 2021-06-12 DIAGNOSIS — D631 Anemia in chronic kidney disease: Secondary | ICD-10-CM | POA: Diagnosis not present

## 2021-06-14 ENCOUNTER — Other Ambulatory Visit: Payer: Self-pay

## 2021-06-14 DIAGNOSIS — N186 End stage renal disease: Secondary | ICD-10-CM | POA: Diagnosis not present

## 2021-06-14 DIAGNOSIS — N2581 Secondary hyperparathyroidism of renal origin: Secondary | ICD-10-CM | POA: Diagnosis not present

## 2021-06-14 DIAGNOSIS — D513 Other dietary vitamin B12 deficiency anemia: Secondary | ICD-10-CM | POA: Diagnosis not present

## 2021-06-14 DIAGNOSIS — E1129 Type 2 diabetes mellitus with other diabetic kidney complication: Secondary | ICD-10-CM | POA: Diagnosis not present

## 2021-06-14 DIAGNOSIS — D631 Anemia in chronic kidney disease: Secondary | ICD-10-CM | POA: Diagnosis not present

## 2021-06-14 DIAGNOSIS — Z992 Dependence on renal dialysis: Secondary | ICD-10-CM | POA: Diagnosis not present

## 2021-06-14 MED ORDER — CARVEDILOL 12.5 MG PO TABS
12.5000 mg | ORAL_TABLET | Freq: Two times a day (BID) | ORAL | 3 refills | Status: DC
  Filled 2021-06-14 – 2021-07-24 (×2): qty 180, 90d supply, fill #0

## 2021-06-14 MED ORDER — LOVASTATIN 20 MG PO TABS
20.0000 mg | ORAL_TABLET | Freq: Every day | ORAL | 3 refills | Status: DC
  Filled 2021-06-14 – 2021-07-24 (×2): qty 90, 90d supply, fill #0

## 2021-06-16 DIAGNOSIS — D631 Anemia in chronic kidney disease: Secondary | ICD-10-CM | POA: Diagnosis not present

## 2021-06-16 DIAGNOSIS — N186 End stage renal disease: Secondary | ICD-10-CM | POA: Diagnosis not present

## 2021-06-16 DIAGNOSIS — Z992 Dependence on renal dialysis: Secondary | ICD-10-CM | POA: Diagnosis not present

## 2021-06-16 DIAGNOSIS — N2581 Secondary hyperparathyroidism of renal origin: Secondary | ICD-10-CM | POA: Diagnosis not present

## 2021-06-16 DIAGNOSIS — D513 Other dietary vitamin B12 deficiency anemia: Secondary | ICD-10-CM | POA: Diagnosis not present

## 2021-06-19 DIAGNOSIS — D631 Anemia in chronic kidney disease: Secondary | ICD-10-CM | POA: Diagnosis not present

## 2021-06-19 DIAGNOSIS — N2581 Secondary hyperparathyroidism of renal origin: Secondary | ICD-10-CM | POA: Diagnosis not present

## 2021-06-19 DIAGNOSIS — N186 End stage renal disease: Secondary | ICD-10-CM | POA: Diagnosis not present

## 2021-06-19 DIAGNOSIS — Z992 Dependence on renal dialysis: Secondary | ICD-10-CM | POA: Diagnosis not present

## 2021-06-19 DIAGNOSIS — D513 Other dietary vitamin B12 deficiency anemia: Secondary | ICD-10-CM | POA: Diagnosis not present

## 2021-06-21 ENCOUNTER — Other Ambulatory Visit: Payer: Self-pay

## 2021-06-21 DIAGNOSIS — Z992 Dependence on renal dialysis: Secondary | ICD-10-CM | POA: Diagnosis not present

## 2021-06-21 DIAGNOSIS — D631 Anemia in chronic kidney disease: Secondary | ICD-10-CM | POA: Diagnosis not present

## 2021-06-21 DIAGNOSIS — N2581 Secondary hyperparathyroidism of renal origin: Secondary | ICD-10-CM | POA: Diagnosis not present

## 2021-06-21 DIAGNOSIS — D513 Other dietary vitamin B12 deficiency anemia: Secondary | ICD-10-CM | POA: Diagnosis not present

## 2021-06-21 DIAGNOSIS — N186 End stage renal disease: Secondary | ICD-10-CM | POA: Diagnosis not present

## 2021-06-23 DIAGNOSIS — D513 Other dietary vitamin B12 deficiency anemia: Secondary | ICD-10-CM | POA: Diagnosis not present

## 2021-06-23 DIAGNOSIS — N186 End stage renal disease: Secondary | ICD-10-CM | POA: Diagnosis not present

## 2021-06-23 DIAGNOSIS — D631 Anemia in chronic kidney disease: Secondary | ICD-10-CM | POA: Diagnosis not present

## 2021-06-23 DIAGNOSIS — N2581 Secondary hyperparathyroidism of renal origin: Secondary | ICD-10-CM | POA: Diagnosis not present

## 2021-06-23 DIAGNOSIS — Z992 Dependence on renal dialysis: Secondary | ICD-10-CM | POA: Diagnosis not present

## 2021-06-26 DIAGNOSIS — D631 Anemia in chronic kidney disease: Secondary | ICD-10-CM | POA: Diagnosis not present

## 2021-06-26 DIAGNOSIS — N186 End stage renal disease: Secondary | ICD-10-CM | POA: Diagnosis not present

## 2021-06-26 DIAGNOSIS — Z992 Dependence on renal dialysis: Secondary | ICD-10-CM | POA: Diagnosis not present

## 2021-06-26 DIAGNOSIS — D513 Other dietary vitamin B12 deficiency anemia: Secondary | ICD-10-CM | POA: Diagnosis not present

## 2021-06-26 DIAGNOSIS — N2581 Secondary hyperparathyroidism of renal origin: Secondary | ICD-10-CM | POA: Diagnosis not present

## 2021-06-28 DIAGNOSIS — N186 End stage renal disease: Secondary | ICD-10-CM | POA: Diagnosis not present

## 2021-06-28 DIAGNOSIS — N2581 Secondary hyperparathyroidism of renal origin: Secondary | ICD-10-CM | POA: Diagnosis not present

## 2021-06-28 DIAGNOSIS — Z992 Dependence on renal dialysis: Secondary | ICD-10-CM | POA: Diagnosis not present

## 2021-06-28 DIAGNOSIS — D631 Anemia in chronic kidney disease: Secondary | ICD-10-CM | POA: Diagnosis not present

## 2021-06-28 DIAGNOSIS — D513 Other dietary vitamin B12 deficiency anemia: Secondary | ICD-10-CM | POA: Diagnosis not present

## 2021-06-30 DIAGNOSIS — D513 Other dietary vitamin B12 deficiency anemia: Secondary | ICD-10-CM | POA: Diagnosis not present

## 2021-06-30 DIAGNOSIS — Z992 Dependence on renal dialysis: Secondary | ICD-10-CM | POA: Diagnosis not present

## 2021-06-30 DIAGNOSIS — N2581 Secondary hyperparathyroidism of renal origin: Secondary | ICD-10-CM | POA: Diagnosis not present

## 2021-06-30 DIAGNOSIS — N186 End stage renal disease: Secondary | ICD-10-CM | POA: Diagnosis not present

## 2021-06-30 DIAGNOSIS — D631 Anemia in chronic kidney disease: Secondary | ICD-10-CM | POA: Diagnosis not present

## 2021-07-03 DIAGNOSIS — I129 Hypertensive chronic kidney disease with stage 1 through stage 4 chronic kidney disease, or unspecified chronic kidney disease: Secondary | ICD-10-CM | POA: Diagnosis not present

## 2021-07-03 DIAGNOSIS — Z992 Dependence on renal dialysis: Secondary | ICD-10-CM | POA: Diagnosis not present

## 2021-07-03 DIAGNOSIS — D631 Anemia in chronic kidney disease: Secondary | ICD-10-CM | POA: Diagnosis not present

## 2021-07-03 DIAGNOSIS — N186 End stage renal disease: Secondary | ICD-10-CM | POA: Diagnosis not present

## 2021-07-03 DIAGNOSIS — N2581 Secondary hyperparathyroidism of renal origin: Secondary | ICD-10-CM | POA: Diagnosis not present

## 2021-07-03 DIAGNOSIS — D513 Other dietary vitamin B12 deficiency anemia: Secondary | ICD-10-CM | POA: Diagnosis not present

## 2021-07-05 DIAGNOSIS — D631 Anemia in chronic kidney disease: Secondary | ICD-10-CM | POA: Diagnosis not present

## 2021-07-05 DIAGNOSIS — N2581 Secondary hyperparathyroidism of renal origin: Secondary | ICD-10-CM | POA: Diagnosis not present

## 2021-07-05 DIAGNOSIS — N186 End stage renal disease: Secondary | ICD-10-CM | POA: Diagnosis not present

## 2021-07-05 DIAGNOSIS — Z992 Dependence on renal dialysis: Secondary | ICD-10-CM | POA: Diagnosis not present

## 2021-07-05 DIAGNOSIS — D513 Other dietary vitamin B12 deficiency anemia: Secondary | ICD-10-CM | POA: Diagnosis not present

## 2021-07-07 DIAGNOSIS — Z992 Dependence on renal dialysis: Secondary | ICD-10-CM | POA: Diagnosis not present

## 2021-07-07 DIAGNOSIS — N186 End stage renal disease: Secondary | ICD-10-CM | POA: Diagnosis not present

## 2021-07-07 DIAGNOSIS — D513 Other dietary vitamin B12 deficiency anemia: Secondary | ICD-10-CM | POA: Diagnosis not present

## 2021-07-07 DIAGNOSIS — N2581 Secondary hyperparathyroidism of renal origin: Secondary | ICD-10-CM | POA: Diagnosis not present

## 2021-07-07 DIAGNOSIS — D631 Anemia in chronic kidney disease: Secondary | ICD-10-CM | POA: Diagnosis not present

## 2021-07-10 DIAGNOSIS — Z992 Dependence on renal dialysis: Secondary | ICD-10-CM | POA: Diagnosis not present

## 2021-07-10 DIAGNOSIS — D513 Other dietary vitamin B12 deficiency anemia: Secondary | ICD-10-CM | POA: Diagnosis not present

## 2021-07-10 DIAGNOSIS — N2581 Secondary hyperparathyroidism of renal origin: Secondary | ICD-10-CM | POA: Diagnosis not present

## 2021-07-10 DIAGNOSIS — D631 Anemia in chronic kidney disease: Secondary | ICD-10-CM | POA: Diagnosis not present

## 2021-07-10 DIAGNOSIS — N186 End stage renal disease: Secondary | ICD-10-CM | POA: Diagnosis not present

## 2021-07-12 DIAGNOSIS — N2581 Secondary hyperparathyroidism of renal origin: Secondary | ICD-10-CM | POA: Diagnosis not present

## 2021-07-12 DIAGNOSIS — D513 Other dietary vitamin B12 deficiency anemia: Secondary | ICD-10-CM | POA: Diagnosis not present

## 2021-07-12 DIAGNOSIS — D631 Anemia in chronic kidney disease: Secondary | ICD-10-CM | POA: Diagnosis not present

## 2021-07-12 DIAGNOSIS — N186 End stage renal disease: Secondary | ICD-10-CM | POA: Diagnosis not present

## 2021-07-12 DIAGNOSIS — Z992 Dependence on renal dialysis: Secondary | ICD-10-CM | POA: Diagnosis not present

## 2021-07-14 DIAGNOSIS — N186 End stage renal disease: Secondary | ICD-10-CM | POA: Diagnosis not present

## 2021-07-14 DIAGNOSIS — D513 Other dietary vitamin B12 deficiency anemia: Secondary | ICD-10-CM | POA: Diagnosis not present

## 2021-07-14 DIAGNOSIS — N2581 Secondary hyperparathyroidism of renal origin: Secondary | ICD-10-CM | POA: Diagnosis not present

## 2021-07-14 DIAGNOSIS — D631 Anemia in chronic kidney disease: Secondary | ICD-10-CM | POA: Diagnosis not present

## 2021-07-14 DIAGNOSIS — Z992 Dependence on renal dialysis: Secondary | ICD-10-CM | POA: Diagnosis not present

## 2021-07-17 DIAGNOSIS — N2581 Secondary hyperparathyroidism of renal origin: Secondary | ICD-10-CM | POA: Diagnosis not present

## 2021-07-17 DIAGNOSIS — N186 End stage renal disease: Secondary | ICD-10-CM | POA: Diagnosis not present

## 2021-07-17 DIAGNOSIS — Z992 Dependence on renal dialysis: Secondary | ICD-10-CM | POA: Diagnosis not present

## 2021-07-17 DIAGNOSIS — D631 Anemia in chronic kidney disease: Secondary | ICD-10-CM | POA: Diagnosis not present

## 2021-07-17 DIAGNOSIS — D513 Other dietary vitamin B12 deficiency anemia: Secondary | ICD-10-CM | POA: Diagnosis not present

## 2021-07-19 DIAGNOSIS — N186 End stage renal disease: Secondary | ICD-10-CM | POA: Diagnosis not present

## 2021-07-19 DIAGNOSIS — N2581 Secondary hyperparathyroidism of renal origin: Secondary | ICD-10-CM | POA: Diagnosis not present

## 2021-07-19 DIAGNOSIS — D631 Anemia in chronic kidney disease: Secondary | ICD-10-CM | POA: Diagnosis not present

## 2021-07-19 DIAGNOSIS — D513 Other dietary vitamin B12 deficiency anemia: Secondary | ICD-10-CM | POA: Diagnosis not present

## 2021-07-19 DIAGNOSIS — Z992 Dependence on renal dialysis: Secondary | ICD-10-CM | POA: Diagnosis not present

## 2021-07-21 DIAGNOSIS — D513 Other dietary vitamin B12 deficiency anemia: Secondary | ICD-10-CM | POA: Diagnosis not present

## 2021-07-21 DIAGNOSIS — N186 End stage renal disease: Secondary | ICD-10-CM | POA: Diagnosis not present

## 2021-07-21 DIAGNOSIS — D631 Anemia in chronic kidney disease: Secondary | ICD-10-CM | POA: Diagnosis not present

## 2021-07-21 DIAGNOSIS — N2581 Secondary hyperparathyroidism of renal origin: Secondary | ICD-10-CM | POA: Diagnosis not present

## 2021-07-21 DIAGNOSIS — Z992 Dependence on renal dialysis: Secondary | ICD-10-CM | POA: Diagnosis not present

## 2021-07-24 ENCOUNTER — Other Ambulatory Visit: Payer: Self-pay

## 2021-07-24 DIAGNOSIS — D513 Other dietary vitamin B12 deficiency anemia: Secondary | ICD-10-CM | POA: Diagnosis not present

## 2021-07-24 DIAGNOSIS — N186 End stage renal disease: Secondary | ICD-10-CM | POA: Diagnosis not present

## 2021-07-24 DIAGNOSIS — D631 Anemia in chronic kidney disease: Secondary | ICD-10-CM | POA: Diagnosis not present

## 2021-07-24 DIAGNOSIS — N2581 Secondary hyperparathyroidism of renal origin: Secondary | ICD-10-CM | POA: Diagnosis not present

## 2021-07-24 DIAGNOSIS — Z992 Dependence on renal dialysis: Secondary | ICD-10-CM | POA: Diagnosis not present

## 2021-07-27 DIAGNOSIS — D631 Anemia in chronic kidney disease: Secondary | ICD-10-CM | POA: Diagnosis not present

## 2021-07-27 DIAGNOSIS — D513 Other dietary vitamin B12 deficiency anemia: Secondary | ICD-10-CM | POA: Diagnosis not present

## 2021-07-27 DIAGNOSIS — N186 End stage renal disease: Secondary | ICD-10-CM | POA: Diagnosis not present

## 2021-07-27 DIAGNOSIS — N2581 Secondary hyperparathyroidism of renal origin: Secondary | ICD-10-CM | POA: Diagnosis not present

## 2021-07-27 DIAGNOSIS — Z992 Dependence on renal dialysis: Secondary | ICD-10-CM | POA: Diagnosis not present

## 2021-07-29 DIAGNOSIS — D513 Other dietary vitamin B12 deficiency anemia: Secondary | ICD-10-CM | POA: Diagnosis not present

## 2021-07-29 DIAGNOSIS — D631 Anemia in chronic kidney disease: Secondary | ICD-10-CM | POA: Diagnosis not present

## 2021-07-29 DIAGNOSIS — N2581 Secondary hyperparathyroidism of renal origin: Secondary | ICD-10-CM | POA: Diagnosis not present

## 2021-07-29 DIAGNOSIS — N186 End stage renal disease: Secondary | ICD-10-CM | POA: Diagnosis not present

## 2021-07-29 DIAGNOSIS — Z992 Dependence on renal dialysis: Secondary | ICD-10-CM | POA: Diagnosis not present

## 2021-08-02 DIAGNOSIS — I129 Hypertensive chronic kidney disease with stage 1 through stage 4 chronic kidney disease, or unspecified chronic kidney disease: Secondary | ICD-10-CM | POA: Diagnosis not present

## 2021-08-02 DIAGNOSIS — Z992 Dependence on renal dialysis: Secondary | ICD-10-CM | POA: Diagnosis not present

## 2021-08-02 DIAGNOSIS — N186 End stage renal disease: Secondary | ICD-10-CM | POA: Diagnosis not present

## 2021-08-04 DIAGNOSIS — N186 End stage renal disease: Secondary | ICD-10-CM | POA: Diagnosis not present

## 2021-08-04 DIAGNOSIS — D513 Other dietary vitamin B12 deficiency anemia: Secondary | ICD-10-CM | POA: Diagnosis not present

## 2021-08-04 DIAGNOSIS — N2581 Secondary hyperparathyroidism of renal origin: Secondary | ICD-10-CM | POA: Diagnosis not present

## 2021-08-04 DIAGNOSIS — D631 Anemia in chronic kidney disease: Secondary | ICD-10-CM | POA: Diagnosis not present

## 2021-08-04 DIAGNOSIS — Z992 Dependence on renal dialysis: Secondary | ICD-10-CM | POA: Diagnosis not present

## 2021-08-07 DIAGNOSIS — D513 Other dietary vitamin B12 deficiency anemia: Secondary | ICD-10-CM | POA: Diagnosis not present

## 2021-08-07 DIAGNOSIS — N186 End stage renal disease: Secondary | ICD-10-CM | POA: Diagnosis not present

## 2021-08-07 DIAGNOSIS — N2581 Secondary hyperparathyroidism of renal origin: Secondary | ICD-10-CM | POA: Diagnosis not present

## 2021-08-07 DIAGNOSIS — Z992 Dependence on renal dialysis: Secondary | ICD-10-CM | POA: Diagnosis not present

## 2021-08-07 DIAGNOSIS — D631 Anemia in chronic kidney disease: Secondary | ICD-10-CM | POA: Diagnosis not present

## 2021-08-11 DIAGNOSIS — N2581 Secondary hyperparathyroidism of renal origin: Secondary | ICD-10-CM | POA: Diagnosis not present

## 2021-08-11 DIAGNOSIS — Z992 Dependence on renal dialysis: Secondary | ICD-10-CM | POA: Diagnosis not present

## 2021-08-11 DIAGNOSIS — D513 Other dietary vitamin B12 deficiency anemia: Secondary | ICD-10-CM | POA: Diagnosis not present

## 2021-08-11 DIAGNOSIS — N186 End stage renal disease: Secondary | ICD-10-CM | POA: Diagnosis not present

## 2021-08-11 DIAGNOSIS — D631 Anemia in chronic kidney disease: Secondary | ICD-10-CM | POA: Diagnosis not present

## 2021-08-14 DIAGNOSIS — N2581 Secondary hyperparathyroidism of renal origin: Secondary | ICD-10-CM | POA: Diagnosis not present

## 2021-08-14 DIAGNOSIS — Z992 Dependence on renal dialysis: Secondary | ICD-10-CM | POA: Diagnosis not present

## 2021-08-14 DIAGNOSIS — N186 End stage renal disease: Secondary | ICD-10-CM | POA: Diagnosis not present

## 2021-08-14 DIAGNOSIS — D631 Anemia in chronic kidney disease: Secondary | ICD-10-CM | POA: Diagnosis not present

## 2021-08-14 DIAGNOSIS — D513 Other dietary vitamin B12 deficiency anemia: Secondary | ICD-10-CM | POA: Diagnosis not present

## 2021-08-16 DIAGNOSIS — Z992 Dependence on renal dialysis: Secondary | ICD-10-CM | POA: Diagnosis not present

## 2021-08-16 DIAGNOSIS — N186 End stage renal disease: Secondary | ICD-10-CM | POA: Diagnosis not present

## 2021-08-16 DIAGNOSIS — D513 Other dietary vitamin B12 deficiency anemia: Secondary | ICD-10-CM | POA: Diagnosis not present

## 2021-08-16 DIAGNOSIS — N2581 Secondary hyperparathyroidism of renal origin: Secondary | ICD-10-CM | POA: Diagnosis not present

## 2021-08-16 DIAGNOSIS — D631 Anemia in chronic kidney disease: Secondary | ICD-10-CM | POA: Diagnosis not present

## 2021-08-18 DIAGNOSIS — Z992 Dependence on renal dialysis: Secondary | ICD-10-CM | POA: Diagnosis not present

## 2021-08-18 DIAGNOSIS — N2581 Secondary hyperparathyroidism of renal origin: Secondary | ICD-10-CM | POA: Diagnosis not present

## 2021-08-18 DIAGNOSIS — D513 Other dietary vitamin B12 deficiency anemia: Secondary | ICD-10-CM | POA: Diagnosis not present

## 2021-08-18 DIAGNOSIS — N186 End stage renal disease: Secondary | ICD-10-CM | POA: Diagnosis not present

## 2021-08-18 DIAGNOSIS — D631 Anemia in chronic kidney disease: Secondary | ICD-10-CM | POA: Diagnosis not present

## 2021-08-21 DIAGNOSIS — Z992 Dependence on renal dialysis: Secondary | ICD-10-CM | POA: Diagnosis not present

## 2021-08-21 DIAGNOSIS — N186 End stage renal disease: Secondary | ICD-10-CM | POA: Diagnosis not present

## 2021-08-21 DIAGNOSIS — D513 Other dietary vitamin B12 deficiency anemia: Secondary | ICD-10-CM | POA: Diagnosis not present

## 2021-08-21 DIAGNOSIS — D631 Anemia in chronic kidney disease: Secondary | ICD-10-CM | POA: Diagnosis not present

## 2021-08-21 DIAGNOSIS — N2581 Secondary hyperparathyroidism of renal origin: Secondary | ICD-10-CM | POA: Diagnosis not present

## 2021-08-23 DIAGNOSIS — Z992 Dependence on renal dialysis: Secondary | ICD-10-CM | POA: Diagnosis not present

## 2021-08-23 DIAGNOSIS — D631 Anemia in chronic kidney disease: Secondary | ICD-10-CM | POA: Diagnosis not present

## 2021-08-23 DIAGNOSIS — N2581 Secondary hyperparathyroidism of renal origin: Secondary | ICD-10-CM | POA: Diagnosis not present

## 2021-08-23 DIAGNOSIS — D513 Other dietary vitamin B12 deficiency anemia: Secondary | ICD-10-CM | POA: Diagnosis not present

## 2021-08-23 DIAGNOSIS — N186 End stage renal disease: Secondary | ICD-10-CM | POA: Diagnosis not present

## 2021-08-28 DIAGNOSIS — N186 End stage renal disease: Secondary | ICD-10-CM | POA: Diagnosis not present

## 2021-08-28 DIAGNOSIS — N2581 Secondary hyperparathyroidism of renal origin: Secondary | ICD-10-CM | POA: Diagnosis not present

## 2021-08-28 DIAGNOSIS — D631 Anemia in chronic kidney disease: Secondary | ICD-10-CM | POA: Diagnosis not present

## 2021-08-28 DIAGNOSIS — D513 Other dietary vitamin B12 deficiency anemia: Secondary | ICD-10-CM | POA: Diagnosis not present

## 2021-08-28 DIAGNOSIS — Z992 Dependence on renal dialysis: Secondary | ICD-10-CM | POA: Diagnosis not present

## 2021-09-01 DIAGNOSIS — N186 End stage renal disease: Secondary | ICD-10-CM | POA: Diagnosis not present

## 2021-09-01 DIAGNOSIS — Z992 Dependence on renal dialysis: Secondary | ICD-10-CM | POA: Diagnosis not present

## 2021-09-01 DIAGNOSIS — N2581 Secondary hyperparathyroidism of renal origin: Secondary | ICD-10-CM | POA: Diagnosis not present

## 2021-09-01 DIAGNOSIS — D631 Anemia in chronic kidney disease: Secondary | ICD-10-CM | POA: Diagnosis not present

## 2021-09-01 DIAGNOSIS — D513 Other dietary vitamin B12 deficiency anemia: Secondary | ICD-10-CM | POA: Diagnosis not present

## 2021-09-02 DIAGNOSIS — I129 Hypertensive chronic kidney disease with stage 1 through stage 4 chronic kidney disease, or unspecified chronic kidney disease: Secondary | ICD-10-CM | POA: Diagnosis not present

## 2021-09-02 DIAGNOSIS — N186 End stage renal disease: Secondary | ICD-10-CM | POA: Diagnosis not present

## 2021-09-02 DIAGNOSIS — Z992 Dependence on renal dialysis: Secondary | ICD-10-CM | POA: Diagnosis not present

## 2021-09-04 DIAGNOSIS — N186 End stage renal disease: Secondary | ICD-10-CM | POA: Diagnosis not present

## 2021-09-04 DIAGNOSIS — D631 Anemia in chronic kidney disease: Secondary | ICD-10-CM | POA: Diagnosis not present

## 2021-09-04 DIAGNOSIS — N2581 Secondary hyperparathyroidism of renal origin: Secondary | ICD-10-CM | POA: Diagnosis not present

## 2021-09-04 DIAGNOSIS — Z992 Dependence on renal dialysis: Secondary | ICD-10-CM | POA: Diagnosis not present

## 2021-09-06 DIAGNOSIS — N186 End stage renal disease: Secondary | ICD-10-CM | POA: Diagnosis not present

## 2021-09-06 DIAGNOSIS — N2581 Secondary hyperparathyroidism of renal origin: Secondary | ICD-10-CM | POA: Diagnosis not present

## 2021-09-06 DIAGNOSIS — Z992 Dependence on renal dialysis: Secondary | ICD-10-CM | POA: Diagnosis not present

## 2021-09-06 DIAGNOSIS — D631 Anemia in chronic kidney disease: Secondary | ICD-10-CM | POA: Diagnosis not present

## 2021-09-08 DIAGNOSIS — D631 Anemia in chronic kidney disease: Secondary | ICD-10-CM | POA: Diagnosis not present

## 2021-09-08 DIAGNOSIS — N186 End stage renal disease: Secondary | ICD-10-CM | POA: Diagnosis not present

## 2021-09-08 DIAGNOSIS — Z992 Dependence on renal dialysis: Secondary | ICD-10-CM | POA: Diagnosis not present

## 2021-09-08 DIAGNOSIS — N2581 Secondary hyperparathyroidism of renal origin: Secondary | ICD-10-CM | POA: Diagnosis not present

## 2021-09-11 DIAGNOSIS — D631 Anemia in chronic kidney disease: Secondary | ICD-10-CM | POA: Diagnosis not present

## 2021-09-11 DIAGNOSIS — N186 End stage renal disease: Secondary | ICD-10-CM | POA: Diagnosis not present

## 2021-09-11 DIAGNOSIS — Z992 Dependence on renal dialysis: Secondary | ICD-10-CM | POA: Diagnosis not present

## 2021-09-11 DIAGNOSIS — N2581 Secondary hyperparathyroidism of renal origin: Secondary | ICD-10-CM | POA: Diagnosis not present

## 2021-09-15 DIAGNOSIS — Z992 Dependence on renal dialysis: Secondary | ICD-10-CM | POA: Diagnosis not present

## 2021-09-15 DIAGNOSIS — N186 End stage renal disease: Secondary | ICD-10-CM | POA: Diagnosis not present

## 2021-09-15 DIAGNOSIS — D631 Anemia in chronic kidney disease: Secondary | ICD-10-CM | POA: Diagnosis not present

## 2021-09-15 DIAGNOSIS — N2581 Secondary hyperparathyroidism of renal origin: Secondary | ICD-10-CM | POA: Diagnosis not present

## 2021-09-18 DIAGNOSIS — E1129 Type 2 diabetes mellitus with other diabetic kidney complication: Secondary | ICD-10-CM | POA: Diagnosis not present

## 2021-09-18 DIAGNOSIS — N2581 Secondary hyperparathyroidism of renal origin: Secondary | ICD-10-CM | POA: Diagnosis not present

## 2021-09-18 DIAGNOSIS — Z992 Dependence on renal dialysis: Secondary | ICD-10-CM | POA: Diagnosis not present

## 2021-09-18 DIAGNOSIS — N186 End stage renal disease: Secondary | ICD-10-CM | POA: Diagnosis not present

## 2021-09-18 DIAGNOSIS — D631 Anemia in chronic kidney disease: Secondary | ICD-10-CM | POA: Diagnosis not present

## 2021-09-19 ENCOUNTER — Telehealth (HOSPITAL_COMMUNITY): Payer: Self-pay

## 2021-09-19 NOTE — Telephone Encounter (Signed)
Spoke with Janace Hoard RN at [facility] requesting orders be faxed to Patient Patrick Ibarra. Explained that pt is scheduled for a blood transfusion on 09/21/21 at the Patient [place]. Per Janace Hoard RN, orders will be faxed indicating pt is to receive 1 unit of blood, including any labwork and premeds needed with doctor's signature. Last Hgb verbally provided -7.5, from 09/15/21 and states pt has missed several cycles of HD. Gabby RN states she attempted to notify pt of his appointment to receive transfusion, was unable to reach pt. Provided Gabby RN with [facility] fax number, verbalized understanding.

## 2021-09-20 ENCOUNTER — Other Ambulatory Visit: Payer: Self-pay

## 2021-09-20 DIAGNOSIS — D631 Anemia in chronic kidney disease: Secondary | ICD-10-CM | POA: Diagnosis not present

## 2021-09-20 DIAGNOSIS — Z992 Dependence on renal dialysis: Secondary | ICD-10-CM | POA: Diagnosis not present

## 2021-09-20 DIAGNOSIS — N186 End stage renal disease: Secondary | ICD-10-CM | POA: Diagnosis not present

## 2021-09-20 DIAGNOSIS — N2581 Secondary hyperparathyroidism of renal origin: Secondary | ICD-10-CM | POA: Diagnosis not present

## 2021-09-20 MED ORDER — AMLODIPINE BESYLATE 10 MG PO TABS
10.0000 mg | ORAL_TABLET | Freq: Every day | ORAL | 3 refills | Status: DC
  Filled 2021-09-20: qty 90, 90d supply, fill #0

## 2021-09-21 ENCOUNTER — Encounter (HOSPITAL_COMMUNITY): Payer: Medicare Other

## 2021-09-22 DIAGNOSIS — N2581 Secondary hyperparathyroidism of renal origin: Secondary | ICD-10-CM | POA: Diagnosis not present

## 2021-09-22 DIAGNOSIS — D631 Anemia in chronic kidney disease: Secondary | ICD-10-CM | POA: Diagnosis not present

## 2021-09-22 DIAGNOSIS — N186 End stage renal disease: Secondary | ICD-10-CM | POA: Diagnosis not present

## 2021-09-22 DIAGNOSIS — Z992 Dependence on renal dialysis: Secondary | ICD-10-CM | POA: Diagnosis not present

## 2021-09-25 DIAGNOSIS — Z992 Dependence on renal dialysis: Secondary | ICD-10-CM | POA: Diagnosis not present

## 2021-09-25 DIAGNOSIS — N2581 Secondary hyperparathyroidism of renal origin: Secondary | ICD-10-CM | POA: Diagnosis not present

## 2021-09-25 DIAGNOSIS — D631 Anemia in chronic kidney disease: Secondary | ICD-10-CM | POA: Diagnosis not present

## 2021-09-25 DIAGNOSIS — N186 End stage renal disease: Secondary | ICD-10-CM | POA: Diagnosis not present

## 2021-09-27 ENCOUNTER — Other Ambulatory Visit: Payer: Self-pay

## 2021-09-27 DIAGNOSIS — Z992 Dependence on renal dialysis: Secondary | ICD-10-CM | POA: Diagnosis not present

## 2021-09-27 DIAGNOSIS — N186 End stage renal disease: Secondary | ICD-10-CM | POA: Diagnosis not present

## 2021-09-27 DIAGNOSIS — N2581 Secondary hyperparathyroidism of renal origin: Secondary | ICD-10-CM | POA: Diagnosis not present

## 2021-09-27 DIAGNOSIS — D631 Anemia in chronic kidney disease: Secondary | ICD-10-CM | POA: Diagnosis not present

## 2021-10-02 DIAGNOSIS — N2581 Secondary hyperparathyroidism of renal origin: Secondary | ICD-10-CM | POA: Diagnosis not present

## 2021-10-02 DIAGNOSIS — N186 End stage renal disease: Secondary | ICD-10-CM | POA: Diagnosis not present

## 2021-10-02 DIAGNOSIS — Z992 Dependence on renal dialysis: Secondary | ICD-10-CM | POA: Diagnosis not present

## 2021-10-02 DIAGNOSIS — D631 Anemia in chronic kidney disease: Secondary | ICD-10-CM | POA: Diagnosis not present

## 2021-10-03 DIAGNOSIS — I129 Hypertensive chronic kidney disease with stage 1 through stage 4 chronic kidney disease, or unspecified chronic kidney disease: Secondary | ICD-10-CM | POA: Diagnosis not present

## 2021-10-03 DIAGNOSIS — Z992 Dependence on renal dialysis: Secondary | ICD-10-CM | POA: Diagnosis not present

## 2021-10-03 DIAGNOSIS — N186 End stage renal disease: Secondary | ICD-10-CM | POA: Diagnosis not present

## 2021-10-04 DIAGNOSIS — D631 Anemia in chronic kidney disease: Secondary | ICD-10-CM | POA: Diagnosis not present

## 2021-10-04 DIAGNOSIS — D513 Other dietary vitamin B12 deficiency anemia: Secondary | ICD-10-CM | POA: Diagnosis not present

## 2021-10-04 DIAGNOSIS — N186 End stage renal disease: Secondary | ICD-10-CM | POA: Diagnosis not present

## 2021-10-04 DIAGNOSIS — D509 Iron deficiency anemia, unspecified: Secondary | ICD-10-CM | POA: Diagnosis not present

## 2021-10-04 DIAGNOSIS — N2581 Secondary hyperparathyroidism of renal origin: Secondary | ICD-10-CM | POA: Diagnosis not present

## 2021-10-04 DIAGNOSIS — Z992 Dependence on renal dialysis: Secondary | ICD-10-CM | POA: Diagnosis not present

## 2021-10-09 DIAGNOSIS — N2581 Secondary hyperparathyroidism of renal origin: Secondary | ICD-10-CM | POA: Diagnosis not present

## 2021-10-09 DIAGNOSIS — N186 End stage renal disease: Secondary | ICD-10-CM | POA: Diagnosis not present

## 2021-10-09 DIAGNOSIS — D513 Other dietary vitamin B12 deficiency anemia: Secondary | ICD-10-CM | POA: Diagnosis not present

## 2021-10-09 DIAGNOSIS — Z992 Dependence on renal dialysis: Secondary | ICD-10-CM | POA: Diagnosis not present

## 2021-10-09 DIAGNOSIS — D631 Anemia in chronic kidney disease: Secondary | ICD-10-CM | POA: Diagnosis not present

## 2021-10-09 DIAGNOSIS — D509 Iron deficiency anemia, unspecified: Secondary | ICD-10-CM | POA: Diagnosis not present

## 2021-10-11 DIAGNOSIS — N2581 Secondary hyperparathyroidism of renal origin: Secondary | ICD-10-CM | POA: Diagnosis not present

## 2021-10-11 DIAGNOSIS — D509 Iron deficiency anemia, unspecified: Secondary | ICD-10-CM | POA: Diagnosis not present

## 2021-10-11 DIAGNOSIS — N186 End stage renal disease: Secondary | ICD-10-CM | POA: Diagnosis not present

## 2021-10-11 DIAGNOSIS — Z992 Dependence on renal dialysis: Secondary | ICD-10-CM | POA: Diagnosis not present

## 2021-10-11 DIAGNOSIS — D513 Other dietary vitamin B12 deficiency anemia: Secondary | ICD-10-CM | POA: Diagnosis not present

## 2021-10-11 DIAGNOSIS — D631 Anemia in chronic kidney disease: Secondary | ICD-10-CM | POA: Diagnosis not present

## 2021-10-16 DIAGNOSIS — N186 End stage renal disease: Secondary | ICD-10-CM | POA: Diagnosis not present

## 2021-10-16 DIAGNOSIS — Z992 Dependence on renal dialysis: Secondary | ICD-10-CM | POA: Diagnosis not present

## 2021-10-16 DIAGNOSIS — D513 Other dietary vitamin B12 deficiency anemia: Secondary | ICD-10-CM | POA: Diagnosis not present

## 2021-10-16 DIAGNOSIS — D509 Iron deficiency anemia, unspecified: Secondary | ICD-10-CM | POA: Diagnosis not present

## 2021-10-16 DIAGNOSIS — D631 Anemia in chronic kidney disease: Secondary | ICD-10-CM | POA: Diagnosis not present

## 2021-10-16 DIAGNOSIS — N2581 Secondary hyperparathyroidism of renal origin: Secondary | ICD-10-CM | POA: Diagnosis not present

## 2021-10-18 DIAGNOSIS — N186 End stage renal disease: Secondary | ICD-10-CM | POA: Diagnosis not present

## 2021-10-18 DIAGNOSIS — Z992 Dependence on renal dialysis: Secondary | ICD-10-CM | POA: Diagnosis not present

## 2021-10-18 DIAGNOSIS — D631 Anemia in chronic kidney disease: Secondary | ICD-10-CM | POA: Diagnosis not present

## 2021-10-18 DIAGNOSIS — D513 Other dietary vitamin B12 deficiency anemia: Secondary | ICD-10-CM | POA: Diagnosis not present

## 2021-10-18 DIAGNOSIS — D509 Iron deficiency anemia, unspecified: Secondary | ICD-10-CM | POA: Diagnosis not present

## 2021-10-18 DIAGNOSIS — N2581 Secondary hyperparathyroidism of renal origin: Secondary | ICD-10-CM | POA: Diagnosis not present

## 2021-10-20 DIAGNOSIS — D513 Other dietary vitamin B12 deficiency anemia: Secondary | ICD-10-CM | POA: Diagnosis not present

## 2021-10-20 DIAGNOSIS — D631 Anemia in chronic kidney disease: Secondary | ICD-10-CM | POA: Diagnosis not present

## 2021-10-20 DIAGNOSIS — Z992 Dependence on renal dialysis: Secondary | ICD-10-CM | POA: Diagnosis not present

## 2021-10-20 DIAGNOSIS — N186 End stage renal disease: Secondary | ICD-10-CM | POA: Diagnosis not present

## 2021-10-20 DIAGNOSIS — D509 Iron deficiency anemia, unspecified: Secondary | ICD-10-CM | POA: Diagnosis not present

## 2021-10-20 DIAGNOSIS — N2581 Secondary hyperparathyroidism of renal origin: Secondary | ICD-10-CM | POA: Diagnosis not present

## 2021-10-23 DIAGNOSIS — N186 End stage renal disease: Secondary | ICD-10-CM | POA: Diagnosis not present

## 2021-10-23 DIAGNOSIS — D631 Anemia in chronic kidney disease: Secondary | ICD-10-CM | POA: Diagnosis not present

## 2021-10-23 DIAGNOSIS — D509 Iron deficiency anemia, unspecified: Secondary | ICD-10-CM | POA: Diagnosis not present

## 2021-10-23 DIAGNOSIS — Z992 Dependence on renal dialysis: Secondary | ICD-10-CM | POA: Diagnosis not present

## 2021-10-23 DIAGNOSIS — N2581 Secondary hyperparathyroidism of renal origin: Secondary | ICD-10-CM | POA: Diagnosis not present

## 2021-10-23 DIAGNOSIS — D513 Other dietary vitamin B12 deficiency anemia: Secondary | ICD-10-CM | POA: Diagnosis not present

## 2021-10-27 DIAGNOSIS — N2581 Secondary hyperparathyroidism of renal origin: Secondary | ICD-10-CM | POA: Diagnosis not present

## 2021-10-27 DIAGNOSIS — Z992 Dependence on renal dialysis: Secondary | ICD-10-CM | POA: Diagnosis not present

## 2021-10-27 DIAGNOSIS — D509 Iron deficiency anemia, unspecified: Secondary | ICD-10-CM | POA: Diagnosis not present

## 2021-10-27 DIAGNOSIS — D513 Other dietary vitamin B12 deficiency anemia: Secondary | ICD-10-CM | POA: Diagnosis not present

## 2021-10-27 DIAGNOSIS — D631 Anemia in chronic kidney disease: Secondary | ICD-10-CM | POA: Diagnosis not present

## 2021-10-27 DIAGNOSIS — N186 End stage renal disease: Secondary | ICD-10-CM | POA: Diagnosis not present

## 2021-10-30 DIAGNOSIS — Z992 Dependence on renal dialysis: Secondary | ICD-10-CM | POA: Diagnosis not present

## 2021-10-30 DIAGNOSIS — N186 End stage renal disease: Secondary | ICD-10-CM | POA: Diagnosis not present

## 2021-10-30 DIAGNOSIS — D631 Anemia in chronic kidney disease: Secondary | ICD-10-CM | POA: Diagnosis not present

## 2021-10-30 DIAGNOSIS — D509 Iron deficiency anemia, unspecified: Secondary | ICD-10-CM | POA: Diagnosis not present

## 2021-10-30 DIAGNOSIS — N2581 Secondary hyperparathyroidism of renal origin: Secondary | ICD-10-CM | POA: Diagnosis not present

## 2021-10-30 DIAGNOSIS — D513 Other dietary vitamin B12 deficiency anemia: Secondary | ICD-10-CM | POA: Diagnosis not present

## 2021-10-31 DIAGNOSIS — N186 End stage renal disease: Secondary | ICD-10-CM | POA: Diagnosis not present

## 2021-10-31 DIAGNOSIS — Z992 Dependence on renal dialysis: Secondary | ICD-10-CM | POA: Diagnosis not present

## 2021-10-31 DIAGNOSIS — I129 Hypertensive chronic kidney disease with stage 1 through stage 4 chronic kidney disease, or unspecified chronic kidney disease: Secondary | ICD-10-CM | POA: Diagnosis not present

## 2021-11-01 DIAGNOSIS — Z992 Dependence on renal dialysis: Secondary | ICD-10-CM | POA: Diagnosis not present

## 2021-11-01 DIAGNOSIS — D631 Anemia in chronic kidney disease: Secondary | ICD-10-CM | POA: Diagnosis not present

## 2021-11-01 DIAGNOSIS — N186 End stage renal disease: Secondary | ICD-10-CM | POA: Diagnosis not present

## 2021-11-01 DIAGNOSIS — N2581 Secondary hyperparathyroidism of renal origin: Secondary | ICD-10-CM | POA: Diagnosis not present

## 2021-11-03 DIAGNOSIS — Z992 Dependence on renal dialysis: Secondary | ICD-10-CM | POA: Diagnosis not present

## 2021-11-03 DIAGNOSIS — N186 End stage renal disease: Secondary | ICD-10-CM | POA: Diagnosis not present

## 2021-11-03 DIAGNOSIS — D631 Anemia in chronic kidney disease: Secondary | ICD-10-CM | POA: Diagnosis not present

## 2021-11-03 DIAGNOSIS — N2581 Secondary hyperparathyroidism of renal origin: Secondary | ICD-10-CM | POA: Diagnosis not present

## 2021-11-06 DIAGNOSIS — D631 Anemia in chronic kidney disease: Secondary | ICD-10-CM | POA: Diagnosis not present

## 2021-11-06 DIAGNOSIS — N186 End stage renal disease: Secondary | ICD-10-CM | POA: Diagnosis not present

## 2021-11-06 DIAGNOSIS — N2581 Secondary hyperparathyroidism of renal origin: Secondary | ICD-10-CM | POA: Diagnosis not present

## 2021-11-06 DIAGNOSIS — Z992 Dependence on renal dialysis: Secondary | ICD-10-CM | POA: Diagnosis not present

## 2021-11-08 ENCOUNTER — Other Ambulatory Visit: Payer: Self-pay

## 2021-11-08 DIAGNOSIS — N2581 Secondary hyperparathyroidism of renal origin: Secondary | ICD-10-CM | POA: Diagnosis not present

## 2021-11-08 DIAGNOSIS — Z992 Dependence on renal dialysis: Secondary | ICD-10-CM | POA: Diagnosis not present

## 2021-11-08 DIAGNOSIS — D631 Anemia in chronic kidney disease: Secondary | ICD-10-CM | POA: Diagnosis not present

## 2021-11-08 DIAGNOSIS — N186 End stage renal disease: Secondary | ICD-10-CM | POA: Diagnosis not present

## 2021-11-08 MED ORDER — CARVEDILOL 25 MG PO TABS
ORAL_TABLET | ORAL | 3 refills | Status: DC
  Filled 2021-11-08: qty 180, 90d supply, fill #0

## 2021-11-09 ENCOUNTER — Other Ambulatory Visit: Payer: Self-pay

## 2021-11-10 DIAGNOSIS — N186 End stage renal disease: Secondary | ICD-10-CM | POA: Diagnosis not present

## 2021-11-10 DIAGNOSIS — N2581 Secondary hyperparathyroidism of renal origin: Secondary | ICD-10-CM | POA: Diagnosis not present

## 2021-11-10 DIAGNOSIS — Z992 Dependence on renal dialysis: Secondary | ICD-10-CM | POA: Diagnosis not present

## 2021-11-10 DIAGNOSIS — D631 Anemia in chronic kidney disease: Secondary | ICD-10-CM | POA: Diagnosis not present

## 2021-11-15 ENCOUNTER — Other Ambulatory Visit: Payer: Self-pay

## 2021-11-15 DIAGNOSIS — N186 End stage renal disease: Secondary | ICD-10-CM | POA: Diagnosis not present

## 2021-11-15 DIAGNOSIS — N2581 Secondary hyperparathyroidism of renal origin: Secondary | ICD-10-CM | POA: Diagnosis not present

## 2021-11-15 DIAGNOSIS — D631 Anemia in chronic kidney disease: Secondary | ICD-10-CM | POA: Diagnosis not present

## 2021-11-15 DIAGNOSIS — Z992 Dependence on renal dialysis: Secondary | ICD-10-CM | POA: Diagnosis not present

## 2021-11-17 DIAGNOSIS — D631 Anemia in chronic kidney disease: Secondary | ICD-10-CM | POA: Diagnosis not present

## 2021-11-17 DIAGNOSIS — Z992 Dependence on renal dialysis: Secondary | ICD-10-CM | POA: Diagnosis not present

## 2021-11-17 DIAGNOSIS — N2581 Secondary hyperparathyroidism of renal origin: Secondary | ICD-10-CM | POA: Diagnosis not present

## 2021-11-17 DIAGNOSIS — N186 End stage renal disease: Secondary | ICD-10-CM | POA: Diagnosis not present

## 2021-11-22 DIAGNOSIS — N2581 Secondary hyperparathyroidism of renal origin: Secondary | ICD-10-CM | POA: Diagnosis not present

## 2021-11-22 DIAGNOSIS — D631 Anemia in chronic kidney disease: Secondary | ICD-10-CM | POA: Diagnosis not present

## 2021-11-22 DIAGNOSIS — Z992 Dependence on renal dialysis: Secondary | ICD-10-CM | POA: Diagnosis not present

## 2021-11-22 DIAGNOSIS — N186 End stage renal disease: Secondary | ICD-10-CM | POA: Diagnosis not present

## 2021-11-24 DIAGNOSIS — D631 Anemia in chronic kidney disease: Secondary | ICD-10-CM | POA: Diagnosis not present

## 2021-11-24 DIAGNOSIS — Z992 Dependence on renal dialysis: Secondary | ICD-10-CM | POA: Diagnosis not present

## 2021-11-24 DIAGNOSIS — N186 End stage renal disease: Secondary | ICD-10-CM | POA: Diagnosis not present

## 2021-11-24 DIAGNOSIS — N2581 Secondary hyperparathyroidism of renal origin: Secondary | ICD-10-CM | POA: Diagnosis not present

## 2021-11-27 DIAGNOSIS — N2581 Secondary hyperparathyroidism of renal origin: Secondary | ICD-10-CM | POA: Diagnosis not present

## 2021-11-27 DIAGNOSIS — Z992 Dependence on renal dialysis: Secondary | ICD-10-CM | POA: Diagnosis not present

## 2021-11-27 DIAGNOSIS — N186 End stage renal disease: Secondary | ICD-10-CM | POA: Diagnosis not present

## 2021-11-27 DIAGNOSIS — D631 Anemia in chronic kidney disease: Secondary | ICD-10-CM | POA: Diagnosis not present

## 2021-11-29 DIAGNOSIS — Z992 Dependence on renal dialysis: Secondary | ICD-10-CM | POA: Diagnosis not present

## 2021-11-29 DIAGNOSIS — N2581 Secondary hyperparathyroidism of renal origin: Secondary | ICD-10-CM | POA: Diagnosis not present

## 2021-11-29 DIAGNOSIS — N186 End stage renal disease: Secondary | ICD-10-CM | POA: Diagnosis not present

## 2021-11-29 DIAGNOSIS — D631 Anemia in chronic kidney disease: Secondary | ICD-10-CM | POA: Diagnosis not present

## 2021-12-01 DIAGNOSIS — N2581 Secondary hyperparathyroidism of renal origin: Secondary | ICD-10-CM | POA: Diagnosis not present

## 2021-12-01 DIAGNOSIS — Z992 Dependence on renal dialysis: Secondary | ICD-10-CM | POA: Diagnosis not present

## 2021-12-01 DIAGNOSIS — N186 End stage renal disease: Secondary | ICD-10-CM | POA: Diagnosis not present

## 2021-12-01 DIAGNOSIS — I129 Hypertensive chronic kidney disease with stage 1 through stage 4 chronic kidney disease, or unspecified chronic kidney disease: Secondary | ICD-10-CM | POA: Diagnosis not present

## 2021-12-01 DIAGNOSIS — D631 Anemia in chronic kidney disease: Secondary | ICD-10-CM | POA: Diagnosis not present

## 2021-12-04 DIAGNOSIS — N186 End stage renal disease: Secondary | ICD-10-CM | POA: Diagnosis not present

## 2021-12-04 DIAGNOSIS — Z992 Dependence on renal dialysis: Secondary | ICD-10-CM | POA: Diagnosis not present

## 2021-12-04 DIAGNOSIS — N2581 Secondary hyperparathyroidism of renal origin: Secondary | ICD-10-CM | POA: Diagnosis not present

## 2021-12-04 DIAGNOSIS — D631 Anemia in chronic kidney disease: Secondary | ICD-10-CM | POA: Diagnosis not present

## 2021-12-06 DIAGNOSIS — D631 Anemia in chronic kidney disease: Secondary | ICD-10-CM | POA: Diagnosis not present

## 2021-12-06 DIAGNOSIS — N186 End stage renal disease: Secondary | ICD-10-CM | POA: Diagnosis not present

## 2021-12-06 DIAGNOSIS — N2581 Secondary hyperparathyroidism of renal origin: Secondary | ICD-10-CM | POA: Diagnosis not present

## 2021-12-06 DIAGNOSIS — Z992 Dependence on renal dialysis: Secondary | ICD-10-CM | POA: Diagnosis not present

## 2021-12-08 DIAGNOSIS — N2581 Secondary hyperparathyroidism of renal origin: Secondary | ICD-10-CM | POA: Diagnosis not present

## 2021-12-08 DIAGNOSIS — N186 End stage renal disease: Secondary | ICD-10-CM | POA: Diagnosis not present

## 2021-12-08 DIAGNOSIS — D631 Anemia in chronic kidney disease: Secondary | ICD-10-CM | POA: Diagnosis not present

## 2021-12-08 DIAGNOSIS — Z992 Dependence on renal dialysis: Secondary | ICD-10-CM | POA: Diagnosis not present

## 2021-12-13 DIAGNOSIS — N2581 Secondary hyperparathyroidism of renal origin: Secondary | ICD-10-CM | POA: Diagnosis not present

## 2021-12-13 DIAGNOSIS — Z992 Dependence on renal dialysis: Secondary | ICD-10-CM | POA: Diagnosis not present

## 2021-12-13 DIAGNOSIS — D631 Anemia in chronic kidney disease: Secondary | ICD-10-CM | POA: Diagnosis not present

## 2021-12-13 DIAGNOSIS — N186 End stage renal disease: Secondary | ICD-10-CM | POA: Diagnosis not present

## 2021-12-15 DIAGNOSIS — N186 End stage renal disease: Secondary | ICD-10-CM | POA: Diagnosis not present

## 2021-12-15 DIAGNOSIS — Z992 Dependence on renal dialysis: Secondary | ICD-10-CM | POA: Diagnosis not present

## 2021-12-15 DIAGNOSIS — N2581 Secondary hyperparathyroidism of renal origin: Secondary | ICD-10-CM | POA: Diagnosis not present

## 2021-12-15 DIAGNOSIS — D631 Anemia in chronic kidney disease: Secondary | ICD-10-CM | POA: Diagnosis not present

## 2021-12-18 DIAGNOSIS — N2581 Secondary hyperparathyroidism of renal origin: Secondary | ICD-10-CM | POA: Diagnosis not present

## 2021-12-18 DIAGNOSIS — N186 End stage renal disease: Secondary | ICD-10-CM | POA: Diagnosis not present

## 2021-12-18 DIAGNOSIS — Z992 Dependence on renal dialysis: Secondary | ICD-10-CM | POA: Diagnosis not present

## 2021-12-18 DIAGNOSIS — D631 Anemia in chronic kidney disease: Secondary | ICD-10-CM | POA: Diagnosis not present

## 2021-12-25 DIAGNOSIS — D631 Anemia in chronic kidney disease: Secondary | ICD-10-CM | POA: Diagnosis not present

## 2021-12-25 DIAGNOSIS — N2581 Secondary hyperparathyroidism of renal origin: Secondary | ICD-10-CM | POA: Diagnosis not present

## 2021-12-25 DIAGNOSIS — Z992 Dependence on renal dialysis: Secondary | ICD-10-CM | POA: Diagnosis not present

## 2021-12-25 DIAGNOSIS — N186 End stage renal disease: Secondary | ICD-10-CM | POA: Diagnosis not present

## 2021-12-27 DIAGNOSIS — D631 Anemia in chronic kidney disease: Secondary | ICD-10-CM | POA: Diagnosis not present

## 2021-12-27 DIAGNOSIS — N186 End stage renal disease: Secondary | ICD-10-CM | POA: Diagnosis not present

## 2021-12-27 DIAGNOSIS — N2581 Secondary hyperparathyroidism of renal origin: Secondary | ICD-10-CM | POA: Diagnosis not present

## 2021-12-27 DIAGNOSIS — Z992 Dependence on renal dialysis: Secondary | ICD-10-CM | POA: Diagnosis not present

## 2021-12-29 DIAGNOSIS — D631 Anemia in chronic kidney disease: Secondary | ICD-10-CM | POA: Diagnosis not present

## 2021-12-29 DIAGNOSIS — N2581 Secondary hyperparathyroidism of renal origin: Secondary | ICD-10-CM | POA: Diagnosis not present

## 2021-12-29 DIAGNOSIS — N186 End stage renal disease: Secondary | ICD-10-CM | POA: Diagnosis not present

## 2021-12-29 DIAGNOSIS — Z992 Dependence on renal dialysis: Secondary | ICD-10-CM | POA: Diagnosis not present

## 2021-12-31 DIAGNOSIS — I129 Hypertensive chronic kidney disease with stage 1 through stage 4 chronic kidney disease, or unspecified chronic kidney disease: Secondary | ICD-10-CM | POA: Diagnosis not present

## 2021-12-31 DIAGNOSIS — Z992 Dependence on renal dialysis: Secondary | ICD-10-CM | POA: Diagnosis not present

## 2021-12-31 DIAGNOSIS — N186 End stage renal disease: Secondary | ICD-10-CM | POA: Diagnosis not present

## 2022-01-03 ENCOUNTER — Other Ambulatory Visit: Payer: Self-pay

## 2022-01-03 DIAGNOSIS — D631 Anemia in chronic kidney disease: Secondary | ICD-10-CM | POA: Diagnosis not present

## 2022-01-03 DIAGNOSIS — N2581 Secondary hyperparathyroidism of renal origin: Secondary | ICD-10-CM | POA: Diagnosis not present

## 2022-01-03 DIAGNOSIS — N186 End stage renal disease: Secondary | ICD-10-CM | POA: Diagnosis not present

## 2022-01-03 DIAGNOSIS — D509 Iron deficiency anemia, unspecified: Secondary | ICD-10-CM | POA: Diagnosis not present

## 2022-01-03 DIAGNOSIS — E875 Hyperkalemia: Secondary | ICD-10-CM | POA: Diagnosis not present

## 2022-01-03 DIAGNOSIS — Z992 Dependence on renal dialysis: Secondary | ICD-10-CM | POA: Diagnosis not present

## 2022-01-03 MED ORDER — HYDRALAZINE HCL 25 MG PO TABS
25.0000 mg | ORAL_TABLET | Freq: Two times a day (BID) | ORAL | 3 refills | Status: DC
  Filled 2022-01-03: qty 60, 30d supply, fill #0

## 2022-01-05 DIAGNOSIS — D631 Anemia in chronic kidney disease: Secondary | ICD-10-CM | POA: Diagnosis not present

## 2022-01-05 DIAGNOSIS — D509 Iron deficiency anemia, unspecified: Secondary | ICD-10-CM | POA: Diagnosis not present

## 2022-01-05 DIAGNOSIS — Z992 Dependence on renal dialysis: Secondary | ICD-10-CM | POA: Diagnosis not present

## 2022-01-05 DIAGNOSIS — E875 Hyperkalemia: Secondary | ICD-10-CM | POA: Diagnosis not present

## 2022-01-05 DIAGNOSIS — N2581 Secondary hyperparathyroidism of renal origin: Secondary | ICD-10-CM | POA: Diagnosis not present

## 2022-01-05 DIAGNOSIS — N186 End stage renal disease: Secondary | ICD-10-CM | POA: Diagnosis not present

## 2022-01-08 DIAGNOSIS — N186 End stage renal disease: Secondary | ICD-10-CM | POA: Diagnosis not present

## 2022-01-08 DIAGNOSIS — N2581 Secondary hyperparathyroidism of renal origin: Secondary | ICD-10-CM | POA: Diagnosis not present

## 2022-01-08 DIAGNOSIS — Z992 Dependence on renal dialysis: Secondary | ICD-10-CM | POA: Diagnosis not present

## 2022-01-08 DIAGNOSIS — D509 Iron deficiency anemia, unspecified: Secondary | ICD-10-CM | POA: Diagnosis not present

## 2022-01-08 DIAGNOSIS — E875 Hyperkalemia: Secondary | ICD-10-CM | POA: Diagnosis not present

## 2022-01-08 DIAGNOSIS — D631 Anemia in chronic kidney disease: Secondary | ICD-10-CM | POA: Diagnosis not present

## 2022-01-10 ENCOUNTER — Other Ambulatory Visit: Payer: Self-pay

## 2022-01-12 DIAGNOSIS — N2581 Secondary hyperparathyroidism of renal origin: Secondary | ICD-10-CM | POA: Diagnosis not present

## 2022-01-12 DIAGNOSIS — N186 End stage renal disease: Secondary | ICD-10-CM | POA: Diagnosis not present

## 2022-01-12 DIAGNOSIS — Z992 Dependence on renal dialysis: Secondary | ICD-10-CM | POA: Diagnosis not present

## 2022-01-12 DIAGNOSIS — D509 Iron deficiency anemia, unspecified: Secondary | ICD-10-CM | POA: Diagnosis not present

## 2022-01-12 DIAGNOSIS — E875 Hyperkalemia: Secondary | ICD-10-CM | POA: Diagnosis not present

## 2022-01-12 DIAGNOSIS — D631 Anemia in chronic kidney disease: Secondary | ICD-10-CM | POA: Diagnosis not present

## 2022-01-17 DIAGNOSIS — E875 Hyperkalemia: Secondary | ICD-10-CM | POA: Diagnosis not present

## 2022-01-17 DIAGNOSIS — D631 Anemia in chronic kidney disease: Secondary | ICD-10-CM | POA: Diagnosis not present

## 2022-01-17 DIAGNOSIS — N2581 Secondary hyperparathyroidism of renal origin: Secondary | ICD-10-CM | POA: Diagnosis not present

## 2022-01-17 DIAGNOSIS — D509 Iron deficiency anemia, unspecified: Secondary | ICD-10-CM | POA: Diagnosis not present

## 2022-01-17 DIAGNOSIS — N186 End stage renal disease: Secondary | ICD-10-CM | POA: Diagnosis not present

## 2022-01-17 DIAGNOSIS — Z992 Dependence on renal dialysis: Secondary | ICD-10-CM | POA: Diagnosis not present

## 2022-01-19 DIAGNOSIS — Z992 Dependence on renal dialysis: Secondary | ICD-10-CM | POA: Diagnosis not present

## 2022-01-19 DIAGNOSIS — D631 Anemia in chronic kidney disease: Secondary | ICD-10-CM | POA: Diagnosis not present

## 2022-01-19 DIAGNOSIS — N2581 Secondary hyperparathyroidism of renal origin: Secondary | ICD-10-CM | POA: Diagnosis not present

## 2022-01-19 DIAGNOSIS — E875 Hyperkalemia: Secondary | ICD-10-CM | POA: Diagnosis not present

## 2022-01-19 DIAGNOSIS — N186 End stage renal disease: Secondary | ICD-10-CM | POA: Diagnosis not present

## 2022-01-19 DIAGNOSIS — D509 Iron deficiency anemia, unspecified: Secondary | ICD-10-CM | POA: Diagnosis not present

## 2022-01-24 DIAGNOSIS — N2581 Secondary hyperparathyroidism of renal origin: Secondary | ICD-10-CM | POA: Diagnosis not present

## 2022-01-24 DIAGNOSIS — Z992 Dependence on renal dialysis: Secondary | ICD-10-CM | POA: Diagnosis not present

## 2022-01-24 DIAGNOSIS — N186 End stage renal disease: Secondary | ICD-10-CM | POA: Diagnosis not present

## 2022-01-24 DIAGNOSIS — D631 Anemia in chronic kidney disease: Secondary | ICD-10-CM | POA: Diagnosis not present

## 2022-01-24 DIAGNOSIS — E875 Hyperkalemia: Secondary | ICD-10-CM | POA: Diagnosis not present

## 2022-01-24 DIAGNOSIS — D509 Iron deficiency anemia, unspecified: Secondary | ICD-10-CM | POA: Diagnosis not present

## 2022-01-26 DIAGNOSIS — D509 Iron deficiency anemia, unspecified: Secondary | ICD-10-CM | POA: Diagnosis not present

## 2022-01-26 DIAGNOSIS — N2581 Secondary hyperparathyroidism of renal origin: Secondary | ICD-10-CM | POA: Diagnosis not present

## 2022-01-26 DIAGNOSIS — N186 End stage renal disease: Secondary | ICD-10-CM | POA: Diagnosis not present

## 2022-01-26 DIAGNOSIS — E875 Hyperkalemia: Secondary | ICD-10-CM | POA: Diagnosis not present

## 2022-01-26 DIAGNOSIS — Z992 Dependence on renal dialysis: Secondary | ICD-10-CM | POA: Diagnosis not present

## 2022-01-26 DIAGNOSIS — D631 Anemia in chronic kidney disease: Secondary | ICD-10-CM | POA: Diagnosis not present

## 2022-01-31 DIAGNOSIS — N186 End stage renal disease: Secondary | ICD-10-CM | POA: Diagnosis not present

## 2022-01-31 DIAGNOSIS — I129 Hypertensive chronic kidney disease with stage 1 through stage 4 chronic kidney disease, or unspecified chronic kidney disease: Secondary | ICD-10-CM | POA: Diagnosis not present

## 2022-01-31 DIAGNOSIS — Z992 Dependence on renal dialysis: Secondary | ICD-10-CM | POA: Diagnosis not present

## 2022-02-02 DIAGNOSIS — D631 Anemia in chronic kidney disease: Secondary | ICD-10-CM | POA: Diagnosis not present

## 2022-02-02 DIAGNOSIS — N186 End stage renal disease: Secondary | ICD-10-CM | POA: Diagnosis not present

## 2022-02-02 DIAGNOSIS — N2581 Secondary hyperparathyroidism of renal origin: Secondary | ICD-10-CM | POA: Diagnosis not present

## 2022-02-02 DIAGNOSIS — Z992 Dependence on renal dialysis: Secondary | ICD-10-CM | POA: Diagnosis not present

## 2022-02-07 ENCOUNTER — Other Ambulatory Visit: Payer: Self-pay

## 2022-02-07 DIAGNOSIS — N186 End stage renal disease: Secondary | ICD-10-CM | POA: Diagnosis not present

## 2022-02-07 DIAGNOSIS — Z992 Dependence on renal dialysis: Secondary | ICD-10-CM | POA: Diagnosis not present

## 2022-02-07 DIAGNOSIS — N2581 Secondary hyperparathyroidism of renal origin: Secondary | ICD-10-CM | POA: Diagnosis not present

## 2022-02-07 DIAGNOSIS — D631 Anemia in chronic kidney disease: Secondary | ICD-10-CM | POA: Diagnosis not present

## 2022-02-07 MED ORDER — CARVEDILOL 25 MG PO TABS
ORAL_TABLET | ORAL | 3 refills | Status: DC
  Filled 2022-02-07: qty 180, 90d supply, fill #0

## 2022-02-07 MED ORDER — AMLODIPINE BESYLATE 10 MG PO TABS
10.0000 mg | ORAL_TABLET | Freq: Every day | ORAL | 3 refills | Status: DC
  Filled 2022-02-07: qty 90, 90d supply, fill #0

## 2022-02-08 ENCOUNTER — Other Ambulatory Visit: Payer: Self-pay

## 2022-02-09 DIAGNOSIS — D631 Anemia in chronic kidney disease: Secondary | ICD-10-CM | POA: Diagnosis not present

## 2022-02-09 DIAGNOSIS — N186 End stage renal disease: Secondary | ICD-10-CM | POA: Diagnosis not present

## 2022-02-09 DIAGNOSIS — Z992 Dependence on renal dialysis: Secondary | ICD-10-CM | POA: Diagnosis not present

## 2022-02-09 DIAGNOSIS — N2581 Secondary hyperparathyroidism of renal origin: Secondary | ICD-10-CM | POA: Diagnosis not present

## 2022-02-12 DIAGNOSIS — N2581 Secondary hyperparathyroidism of renal origin: Secondary | ICD-10-CM | POA: Diagnosis not present

## 2022-02-12 DIAGNOSIS — N186 End stage renal disease: Secondary | ICD-10-CM | POA: Diagnosis not present

## 2022-02-12 DIAGNOSIS — Z992 Dependence on renal dialysis: Secondary | ICD-10-CM | POA: Diagnosis not present

## 2022-02-12 DIAGNOSIS — D631 Anemia in chronic kidney disease: Secondary | ICD-10-CM | POA: Diagnosis not present

## 2022-02-16 DIAGNOSIS — Z992 Dependence on renal dialysis: Secondary | ICD-10-CM | POA: Diagnosis not present

## 2022-02-16 DIAGNOSIS — D631 Anemia in chronic kidney disease: Secondary | ICD-10-CM | POA: Diagnosis not present

## 2022-02-16 DIAGNOSIS — N186 End stage renal disease: Secondary | ICD-10-CM | POA: Diagnosis not present

## 2022-02-16 DIAGNOSIS — N2581 Secondary hyperparathyroidism of renal origin: Secondary | ICD-10-CM | POA: Diagnosis not present

## 2022-02-19 DIAGNOSIS — N186 End stage renal disease: Secondary | ICD-10-CM | POA: Diagnosis not present

## 2022-02-19 DIAGNOSIS — D631 Anemia in chronic kidney disease: Secondary | ICD-10-CM | POA: Diagnosis not present

## 2022-02-19 DIAGNOSIS — N2581 Secondary hyperparathyroidism of renal origin: Secondary | ICD-10-CM | POA: Diagnosis not present

## 2022-02-19 DIAGNOSIS — Z992 Dependence on renal dialysis: Secondary | ICD-10-CM | POA: Diagnosis not present

## 2022-02-23 DIAGNOSIS — N2581 Secondary hyperparathyroidism of renal origin: Secondary | ICD-10-CM | POA: Diagnosis not present

## 2022-02-23 DIAGNOSIS — D631 Anemia in chronic kidney disease: Secondary | ICD-10-CM | POA: Diagnosis not present

## 2022-02-23 DIAGNOSIS — N186 End stage renal disease: Secondary | ICD-10-CM | POA: Diagnosis not present

## 2022-02-23 DIAGNOSIS — Z992 Dependence on renal dialysis: Secondary | ICD-10-CM | POA: Diagnosis not present

## 2022-02-28 DIAGNOSIS — D631 Anemia in chronic kidney disease: Secondary | ICD-10-CM | POA: Diagnosis not present

## 2022-02-28 DIAGNOSIS — Z992 Dependence on renal dialysis: Secondary | ICD-10-CM | POA: Diagnosis not present

## 2022-02-28 DIAGNOSIS — N186 End stage renal disease: Secondary | ICD-10-CM | POA: Diagnosis not present

## 2022-02-28 DIAGNOSIS — N2581 Secondary hyperparathyroidism of renal origin: Secondary | ICD-10-CM | POA: Diagnosis not present

## 2022-03-02 DIAGNOSIS — I129 Hypertensive chronic kidney disease with stage 1 through stage 4 chronic kidney disease, or unspecified chronic kidney disease: Secondary | ICD-10-CM | POA: Diagnosis not present

## 2022-03-02 DIAGNOSIS — N2581 Secondary hyperparathyroidism of renal origin: Secondary | ICD-10-CM | POA: Diagnosis not present

## 2022-03-02 DIAGNOSIS — D631 Anemia in chronic kidney disease: Secondary | ICD-10-CM | POA: Diagnosis not present

## 2022-03-02 DIAGNOSIS — D509 Iron deficiency anemia, unspecified: Secondary | ICD-10-CM | POA: Diagnosis not present

## 2022-03-02 DIAGNOSIS — Z992 Dependence on renal dialysis: Secondary | ICD-10-CM | POA: Diagnosis not present

## 2022-03-02 DIAGNOSIS — N186 End stage renal disease: Secondary | ICD-10-CM | POA: Diagnosis not present

## 2022-03-05 DIAGNOSIS — D509 Iron deficiency anemia, unspecified: Secondary | ICD-10-CM | POA: Diagnosis not present

## 2022-03-05 DIAGNOSIS — Z992 Dependence on renal dialysis: Secondary | ICD-10-CM | POA: Diagnosis not present

## 2022-03-05 DIAGNOSIS — N186 End stage renal disease: Secondary | ICD-10-CM | POA: Diagnosis not present

## 2022-03-05 DIAGNOSIS — D631 Anemia in chronic kidney disease: Secondary | ICD-10-CM | POA: Diagnosis not present

## 2022-03-05 DIAGNOSIS — N2581 Secondary hyperparathyroidism of renal origin: Secondary | ICD-10-CM | POA: Diagnosis not present

## 2022-03-09 DIAGNOSIS — N186 End stage renal disease: Secondary | ICD-10-CM | POA: Diagnosis not present

## 2022-03-09 DIAGNOSIS — D509 Iron deficiency anemia, unspecified: Secondary | ICD-10-CM | POA: Diagnosis not present

## 2022-03-09 DIAGNOSIS — N2581 Secondary hyperparathyroidism of renal origin: Secondary | ICD-10-CM | POA: Diagnosis not present

## 2022-03-09 DIAGNOSIS — D631 Anemia in chronic kidney disease: Secondary | ICD-10-CM | POA: Diagnosis not present

## 2022-03-09 DIAGNOSIS — Z992 Dependence on renal dialysis: Secondary | ICD-10-CM | POA: Diagnosis not present

## 2022-03-16 DIAGNOSIS — N2581 Secondary hyperparathyroidism of renal origin: Secondary | ICD-10-CM | POA: Diagnosis not present

## 2022-03-16 DIAGNOSIS — D631 Anemia in chronic kidney disease: Secondary | ICD-10-CM | POA: Diagnosis not present

## 2022-03-16 DIAGNOSIS — N186 End stage renal disease: Secondary | ICD-10-CM | POA: Diagnosis not present

## 2022-03-16 DIAGNOSIS — D509 Iron deficiency anemia, unspecified: Secondary | ICD-10-CM | POA: Diagnosis not present

## 2022-03-16 DIAGNOSIS — Z992 Dependence on renal dialysis: Secondary | ICD-10-CM | POA: Diagnosis not present

## 2022-03-21 DIAGNOSIS — N186 End stage renal disease: Secondary | ICD-10-CM | POA: Diagnosis not present

## 2022-03-21 DIAGNOSIS — D509 Iron deficiency anemia, unspecified: Secondary | ICD-10-CM | POA: Diagnosis not present

## 2022-03-21 DIAGNOSIS — D631 Anemia in chronic kidney disease: Secondary | ICD-10-CM | POA: Diagnosis not present

## 2022-03-21 DIAGNOSIS — N2581 Secondary hyperparathyroidism of renal origin: Secondary | ICD-10-CM | POA: Diagnosis not present

## 2022-03-21 DIAGNOSIS — Z992 Dependence on renal dialysis: Secondary | ICD-10-CM | POA: Diagnosis not present

## 2022-03-23 DIAGNOSIS — D509 Iron deficiency anemia, unspecified: Secondary | ICD-10-CM | POA: Diagnosis not present

## 2022-03-23 DIAGNOSIS — N186 End stage renal disease: Secondary | ICD-10-CM | POA: Diagnosis not present

## 2022-03-23 DIAGNOSIS — Z992 Dependence on renal dialysis: Secondary | ICD-10-CM | POA: Diagnosis not present

## 2022-03-23 DIAGNOSIS — D631 Anemia in chronic kidney disease: Secondary | ICD-10-CM | POA: Diagnosis not present

## 2022-03-23 DIAGNOSIS — N2581 Secondary hyperparathyroidism of renal origin: Secondary | ICD-10-CM | POA: Diagnosis not present

## 2022-03-30 DIAGNOSIS — N186 End stage renal disease: Secondary | ICD-10-CM | POA: Diagnosis not present

## 2022-03-30 DIAGNOSIS — N2581 Secondary hyperparathyroidism of renal origin: Secondary | ICD-10-CM | POA: Diagnosis not present

## 2022-03-30 DIAGNOSIS — D509 Iron deficiency anemia, unspecified: Secondary | ICD-10-CM | POA: Diagnosis not present

## 2022-03-30 DIAGNOSIS — Z992 Dependence on renal dialysis: Secondary | ICD-10-CM | POA: Diagnosis not present

## 2022-03-30 DIAGNOSIS — D631 Anemia in chronic kidney disease: Secondary | ICD-10-CM | POA: Diagnosis not present

## 2022-04-01 ENCOUNTER — Ambulatory Visit: Payer: Self-pay

## 2022-04-01 NOTE — Patient Outreach (Signed)
  Care Management   Outreach Note  04/01/2022 Name: Patrick Ibarra MRN: [id number] DOB: 13-Jul-1981  An unsuccessful telephone outreach was attempted today. The patient was referred to the case management team for assistance with care management and care coordination.   Follow Up Plan:  The care management team will reach out to the patient again over the next 10 days.   Daneen Schick, BSW, CDP Social Worker, Certified Dementia Practitioner Care Coordination [phone]

## 2022-04-03 ENCOUNTER — Ambulatory Visit: Payer: Self-pay

## 2022-04-03 NOTE — Patient Outreach (Signed)
  Care Coordination   04/03/2022 Name: Patrick Ibarra MRN: [id number] DOB: September 09, 1980   Care Coordination Outreach Attempts:  A second unsuccessful outreach was attempted today to offer the patient with information about available care coordination services as a benefit of their health plan.     Follow Up Plan:  Additional outreach attempts will be made to offer the patient care coordination information and services.   Encounter Outcome:  No Answer  Care Coordination Interventions Activated:  No   Care Coordination Interventions:  No, not indicated    Barb Merino, RN, BSN, CCM Care Management Coordinator [facility] Care Management Direct Phone: [phone]

## 2022-04-06 DIAGNOSIS — Z992 Dependence on renal dialysis: Secondary | ICD-10-CM | POA: Diagnosis not present

## 2022-04-06 DIAGNOSIS — N186 End stage renal disease: Secondary | ICD-10-CM | POA: Diagnosis not present

## 2022-04-06 DIAGNOSIS — D631 Anemia in chronic kidney disease: Secondary | ICD-10-CM | POA: Diagnosis not present

## 2022-04-06 DIAGNOSIS — D509 Iron deficiency anemia, unspecified: Secondary | ICD-10-CM | POA: Diagnosis not present

## 2022-04-06 DIAGNOSIS — N2581 Secondary hyperparathyroidism of renal origin: Secondary | ICD-10-CM | POA: Diagnosis not present

## 2022-04-08 ENCOUNTER — Ambulatory Visit: Payer: Self-pay

## 2022-04-08 NOTE — Patient Outreach (Signed)
  Care Coordination   04/08/2022 Name: Patrick Ibarra MRN: [id number] DOB: 02/11/1981   Care Coordination Outreach Attempts:  A third unsuccessful outreach was attempted today to offer the patient with information about available care coordination services as a benefit of their health plan.   Follow Up Plan:  No further outreach attempts will be made at this time. We have been unable to contact the patient to offer or enroll patient in care coordination services  Encounter Outcome:  No Answer  Care Coordination Interventions Activated:  No   Care Coordination Interventions:  No, not indicated    Daneen Schick, BSW, CDP Social Worker, Certified Dementia Practitioner Care Coordination [phone]

## 2022-04-13 DIAGNOSIS — Z992 Dependence on renal dialysis: Secondary | ICD-10-CM | POA: Diagnosis not present

## 2022-04-13 DIAGNOSIS — D631 Anemia in chronic kidney disease: Secondary | ICD-10-CM | POA: Diagnosis not present

## 2022-04-13 DIAGNOSIS — N186 End stage renal disease: Secondary | ICD-10-CM | POA: Diagnosis not present

## 2022-04-13 DIAGNOSIS — D509 Iron deficiency anemia, unspecified: Secondary | ICD-10-CM | POA: Diagnosis not present

## 2022-04-13 DIAGNOSIS — N2581 Secondary hyperparathyroidism of renal origin: Secondary | ICD-10-CM | POA: Diagnosis not present

## 2022-04-16 DIAGNOSIS — N186 End stage renal disease: Secondary | ICD-10-CM | POA: Diagnosis not present

## 2022-04-16 DIAGNOSIS — D509 Iron deficiency anemia, unspecified: Secondary | ICD-10-CM | POA: Diagnosis not present

## 2022-04-16 DIAGNOSIS — Z992 Dependence on renal dialysis: Secondary | ICD-10-CM | POA: Diagnosis not present

## 2022-04-16 DIAGNOSIS — N2581 Secondary hyperparathyroidism of renal origin: Secondary | ICD-10-CM | POA: Diagnosis not present

## 2022-04-16 DIAGNOSIS — D631 Anemia in chronic kidney disease: Secondary | ICD-10-CM | POA: Diagnosis not present

## 2022-04-20 DIAGNOSIS — N2581 Secondary hyperparathyroidism of renal origin: Secondary | ICD-10-CM | POA: Diagnosis not present

## 2022-04-20 DIAGNOSIS — Z992 Dependence on renal dialysis: Secondary | ICD-10-CM | POA: Diagnosis not present

## 2022-04-20 DIAGNOSIS — N186 End stage renal disease: Secondary | ICD-10-CM | POA: Diagnosis not present

## 2022-04-20 DIAGNOSIS — D631 Anemia in chronic kidney disease: Secondary | ICD-10-CM | POA: Diagnosis not present

## 2022-04-20 DIAGNOSIS — D509 Iron deficiency anemia, unspecified: Secondary | ICD-10-CM | POA: Diagnosis not present

## 2022-04-25 DIAGNOSIS — Z992 Dependence on renal dialysis: Secondary | ICD-10-CM | POA: Diagnosis not present

## 2022-04-25 DIAGNOSIS — D631 Anemia in chronic kidney disease: Secondary | ICD-10-CM | POA: Diagnosis not present

## 2022-04-25 DIAGNOSIS — D509 Iron deficiency anemia, unspecified: Secondary | ICD-10-CM | POA: Diagnosis not present

## 2022-04-25 DIAGNOSIS — N186 End stage renal disease: Secondary | ICD-10-CM | POA: Diagnosis not present

## 2022-04-25 DIAGNOSIS — N2581 Secondary hyperparathyroidism of renal origin: Secondary | ICD-10-CM | POA: Diagnosis not present

## 2022-04-27 DIAGNOSIS — Z992 Dependence on renal dialysis: Secondary | ICD-10-CM | POA: Diagnosis not present

## 2022-04-27 DIAGNOSIS — D631 Anemia in chronic kidney disease: Secondary | ICD-10-CM | POA: Diagnosis not present

## 2022-04-27 DIAGNOSIS — N2581 Secondary hyperparathyroidism of renal origin: Secondary | ICD-10-CM | POA: Diagnosis not present

## 2022-04-27 DIAGNOSIS — N186 End stage renal disease: Secondary | ICD-10-CM | POA: Diagnosis not present

## 2022-04-27 DIAGNOSIS — D509 Iron deficiency anemia, unspecified: Secondary | ICD-10-CM | POA: Diagnosis not present

## 2022-05-03 DIAGNOSIS — I129 Hypertensive chronic kidney disease with stage 1 through stage 4 chronic kidney disease, or unspecified chronic kidney disease: Secondary | ICD-10-CM | POA: Diagnosis not present

## 2022-05-03 DIAGNOSIS — N186 End stage renal disease: Secondary | ICD-10-CM | POA: Diagnosis not present

## 2022-05-03 DIAGNOSIS — Z992 Dependence on renal dialysis: Secondary | ICD-10-CM | POA: Diagnosis not present

## 2022-05-04 DIAGNOSIS — D509 Iron deficiency anemia, unspecified: Secondary | ICD-10-CM | POA: Diagnosis not present

## 2022-05-04 DIAGNOSIS — N2581 Secondary hyperparathyroidism of renal origin: Secondary | ICD-10-CM | POA: Diagnosis not present

## 2022-05-04 DIAGNOSIS — N186 End stage renal disease: Secondary | ICD-10-CM | POA: Diagnosis not present

## 2022-05-04 DIAGNOSIS — D631 Anemia in chronic kidney disease: Secondary | ICD-10-CM | POA: Diagnosis not present

## 2022-05-04 DIAGNOSIS — Z992 Dependence on renal dialysis: Secondary | ICD-10-CM | POA: Diagnosis not present

## 2022-05-09 DIAGNOSIS — N186 End stage renal disease: Secondary | ICD-10-CM | POA: Diagnosis not present

## 2022-05-09 DIAGNOSIS — D509 Iron deficiency anemia, unspecified: Secondary | ICD-10-CM | POA: Diagnosis not present

## 2022-05-09 DIAGNOSIS — D631 Anemia in chronic kidney disease: Secondary | ICD-10-CM | POA: Diagnosis not present

## 2022-05-09 DIAGNOSIS — N2581 Secondary hyperparathyroidism of renal origin: Secondary | ICD-10-CM | POA: Diagnosis not present

## 2022-05-09 DIAGNOSIS — Z992 Dependence on renal dialysis: Secondary | ICD-10-CM | POA: Diagnosis not present

## 2022-05-18 DIAGNOSIS — D509 Iron deficiency anemia, unspecified: Secondary | ICD-10-CM | POA: Diagnosis not present

## 2022-05-18 DIAGNOSIS — N2581 Secondary hyperparathyroidism of renal origin: Secondary | ICD-10-CM | POA: Diagnosis not present

## 2022-05-18 DIAGNOSIS — D631 Anemia in chronic kidney disease: Secondary | ICD-10-CM | POA: Diagnosis not present

## 2022-05-18 DIAGNOSIS — N186 End stage renal disease: Secondary | ICD-10-CM | POA: Diagnosis not present

## 2022-05-18 DIAGNOSIS — Z992 Dependence on renal dialysis: Secondary | ICD-10-CM | POA: Diagnosis not present

## 2022-05-23 DIAGNOSIS — Z992 Dependence on renal dialysis: Secondary | ICD-10-CM | POA: Diagnosis not present

## 2022-05-23 DIAGNOSIS — N2581 Secondary hyperparathyroidism of renal origin: Secondary | ICD-10-CM | POA: Diagnosis not present

## 2022-05-23 DIAGNOSIS — D631 Anemia in chronic kidney disease: Secondary | ICD-10-CM | POA: Diagnosis not present

## 2022-05-23 DIAGNOSIS — N186 End stage renal disease: Secondary | ICD-10-CM | POA: Diagnosis not present

## 2022-05-23 DIAGNOSIS — D509 Iron deficiency anemia, unspecified: Secondary | ICD-10-CM | POA: Diagnosis not present

## 2022-05-25 DIAGNOSIS — N186 End stage renal disease: Secondary | ICD-10-CM | POA: Diagnosis not present

## 2022-05-25 DIAGNOSIS — D509 Iron deficiency anemia, unspecified: Secondary | ICD-10-CM | POA: Diagnosis not present

## 2022-05-25 DIAGNOSIS — D631 Anemia in chronic kidney disease: Secondary | ICD-10-CM | POA: Diagnosis not present

## 2022-05-25 DIAGNOSIS — Z992 Dependence on renal dialysis: Secondary | ICD-10-CM | POA: Diagnosis not present

## 2022-05-25 DIAGNOSIS — N2581 Secondary hyperparathyroidism of renal origin: Secondary | ICD-10-CM | POA: Diagnosis not present

## 2022-05-28 ENCOUNTER — Other Ambulatory Visit: Payer: Self-pay

## 2022-05-28 DIAGNOSIS — Z992 Dependence on renal dialysis: Secondary | ICD-10-CM | POA: Diagnosis not present

## 2022-05-28 DIAGNOSIS — N2581 Secondary hyperparathyroidism of renal origin: Secondary | ICD-10-CM | POA: Diagnosis not present

## 2022-05-28 DIAGNOSIS — D631 Anemia in chronic kidney disease: Secondary | ICD-10-CM | POA: Diagnosis not present

## 2022-05-28 DIAGNOSIS — D509 Iron deficiency anemia, unspecified: Secondary | ICD-10-CM | POA: Diagnosis not present

## 2022-05-28 DIAGNOSIS — N186 End stage renal disease: Secondary | ICD-10-CM | POA: Diagnosis not present

## 2022-05-28 MED ORDER — AMLODIPINE BESYLATE 10 MG PO TABS
10.0000 mg | ORAL_TABLET | Freq: Every day | ORAL | 3 refills | Status: DC
  Filled 2022-05-28 – 2022-06-05 (×2): qty 90, 90d supply, fill #0
  Filled 2022-12-28: qty 90, 90d supply, fill #1

## 2022-05-28 MED ORDER — CARVEDILOL 25 MG PO TABS
25.0000 mg | ORAL_TABLET | Freq: Two times a day (BID) | ORAL | 3 refills | Status: DC
  Filled 2022-05-28 – 2022-06-05 (×2): qty 180, 90d supply, fill #0
  Filled 2022-12-28: qty 180, 90d supply, fill #1

## 2022-05-28 MED ORDER — HYDRALAZINE HCL 25 MG PO TABS
25.0000 mg | ORAL_TABLET | Freq: Two times a day (BID) | ORAL | 3 refills | Status: DC
  Filled 2022-05-28 – 2022-06-05 (×2): qty 60, 30d supply, fill #0
  Filled 2022-12-28: qty 60, 30d supply, fill #1

## 2022-05-30 DIAGNOSIS — N186 End stage renal disease: Secondary | ICD-10-CM | POA: Diagnosis not present

## 2022-05-30 DIAGNOSIS — Z992 Dependence on renal dialysis: Secondary | ICD-10-CM | POA: Diagnosis not present

## 2022-05-30 DIAGNOSIS — N2581 Secondary hyperparathyroidism of renal origin: Secondary | ICD-10-CM | POA: Diagnosis not present

## 2022-05-30 DIAGNOSIS — D631 Anemia in chronic kidney disease: Secondary | ICD-10-CM | POA: Diagnosis not present

## 2022-05-30 DIAGNOSIS — D509 Iron deficiency anemia, unspecified: Secondary | ICD-10-CM | POA: Diagnosis not present

## 2022-06-03 ENCOUNTER — Other Ambulatory Visit: Payer: Self-pay

## 2022-06-05 ENCOUNTER — Other Ambulatory Visit: Payer: Self-pay

## 2022-06-08 DIAGNOSIS — N2581 Secondary hyperparathyroidism of renal origin: Secondary | ICD-10-CM | POA: Diagnosis not present

## 2022-06-08 DIAGNOSIS — Z992 Dependence on renal dialysis: Secondary | ICD-10-CM | POA: Diagnosis not present

## 2022-06-08 DIAGNOSIS — N186 End stage renal disease: Secondary | ICD-10-CM | POA: Diagnosis not present

## 2022-06-08 DIAGNOSIS — D631 Anemia in chronic kidney disease: Secondary | ICD-10-CM | POA: Diagnosis not present

## 2022-06-08 DIAGNOSIS — D509 Iron deficiency anemia, unspecified: Secondary | ICD-10-CM | POA: Diagnosis not present

## 2022-06-15 DIAGNOSIS — N186 End stage renal disease: Secondary | ICD-10-CM | POA: Diagnosis not present

## 2022-06-15 DIAGNOSIS — D509 Iron deficiency anemia, unspecified: Secondary | ICD-10-CM | POA: Diagnosis not present

## 2022-06-15 DIAGNOSIS — D631 Anemia in chronic kidney disease: Secondary | ICD-10-CM | POA: Diagnosis not present

## 2022-06-15 DIAGNOSIS — N2581 Secondary hyperparathyroidism of renal origin: Secondary | ICD-10-CM | POA: Diagnosis not present

## 2022-06-15 DIAGNOSIS — Z992 Dependence on renal dialysis: Secondary | ICD-10-CM | POA: Diagnosis not present

## 2022-06-22 DIAGNOSIS — Z992 Dependence on renal dialysis: Secondary | ICD-10-CM | POA: Diagnosis not present

## 2022-06-22 DIAGNOSIS — D631 Anemia in chronic kidney disease: Secondary | ICD-10-CM | POA: Diagnosis not present

## 2022-06-22 DIAGNOSIS — D509 Iron deficiency anemia, unspecified: Secondary | ICD-10-CM | POA: Diagnosis not present

## 2022-06-22 DIAGNOSIS — N2581 Secondary hyperparathyroidism of renal origin: Secondary | ICD-10-CM | POA: Diagnosis not present

## 2022-06-22 DIAGNOSIS — N186 End stage renal disease: Secondary | ICD-10-CM | POA: Diagnosis not present

## 2022-06-29 DIAGNOSIS — D509 Iron deficiency anemia, unspecified: Secondary | ICD-10-CM | POA: Diagnosis not present

## 2022-06-29 DIAGNOSIS — N186 End stage renal disease: Secondary | ICD-10-CM | POA: Diagnosis not present

## 2022-06-29 DIAGNOSIS — Z992 Dependence on renal dialysis: Secondary | ICD-10-CM | POA: Diagnosis not present

## 2022-06-29 DIAGNOSIS — D631 Anemia in chronic kidney disease: Secondary | ICD-10-CM | POA: Diagnosis not present

## 2022-06-29 DIAGNOSIS — N2581 Secondary hyperparathyroidism of renal origin: Secondary | ICD-10-CM | POA: Diagnosis not present

## 2022-07-02 DIAGNOSIS — D509 Iron deficiency anemia, unspecified: Secondary | ICD-10-CM | POA: Diagnosis not present

## 2022-07-02 DIAGNOSIS — D631 Anemia in chronic kidney disease: Secondary | ICD-10-CM | POA: Diagnosis not present

## 2022-07-02 DIAGNOSIS — N2581 Secondary hyperparathyroidism of renal origin: Secondary | ICD-10-CM | POA: Diagnosis not present

## 2022-07-02 DIAGNOSIS — N186 End stage renal disease: Secondary | ICD-10-CM | POA: Diagnosis not present

## 2022-07-02 DIAGNOSIS — Z992 Dependence on renal dialysis: Secondary | ICD-10-CM | POA: Diagnosis not present

## 2022-07-03 DIAGNOSIS — N186 End stage renal disease: Secondary | ICD-10-CM | POA: Diagnosis not present

## 2022-07-03 DIAGNOSIS — Z992 Dependence on renal dialysis: Secondary | ICD-10-CM | POA: Diagnosis not present

## 2022-07-03 DIAGNOSIS — I129 Hypertensive chronic kidney disease with stage 1 through stage 4 chronic kidney disease, or unspecified chronic kidney disease: Secondary | ICD-10-CM | POA: Diagnosis not present

## 2022-07-04 DIAGNOSIS — D509 Iron deficiency anemia, unspecified: Secondary | ICD-10-CM | POA: Diagnosis not present

## 2022-07-04 DIAGNOSIS — N186 End stage renal disease: Secondary | ICD-10-CM | POA: Diagnosis not present

## 2022-07-04 DIAGNOSIS — Z992 Dependence on renal dialysis: Secondary | ICD-10-CM | POA: Diagnosis not present

## 2022-07-04 DIAGNOSIS — N2581 Secondary hyperparathyroidism of renal origin: Secondary | ICD-10-CM | POA: Diagnosis not present

## 2022-07-04 DIAGNOSIS — D631 Anemia in chronic kidney disease: Secondary | ICD-10-CM | POA: Diagnosis not present

## 2022-07-06 DIAGNOSIS — N186 End stage renal disease: Secondary | ICD-10-CM | POA: Diagnosis not present

## 2022-07-06 DIAGNOSIS — D509 Iron deficiency anemia, unspecified: Secondary | ICD-10-CM | POA: Diagnosis not present

## 2022-07-06 DIAGNOSIS — D631 Anemia in chronic kidney disease: Secondary | ICD-10-CM | POA: Diagnosis not present

## 2022-07-06 DIAGNOSIS — Z992 Dependence on renal dialysis: Secondary | ICD-10-CM | POA: Diagnosis not present

## 2022-07-06 DIAGNOSIS — N2581 Secondary hyperparathyroidism of renal origin: Secondary | ICD-10-CM | POA: Diagnosis not present

## 2022-07-13 DIAGNOSIS — N186 End stage renal disease: Secondary | ICD-10-CM | POA: Diagnosis not present

## 2022-07-13 DIAGNOSIS — D509 Iron deficiency anemia, unspecified: Secondary | ICD-10-CM | POA: Diagnosis not present

## 2022-07-13 DIAGNOSIS — Z992 Dependence on renal dialysis: Secondary | ICD-10-CM | POA: Diagnosis not present

## 2022-07-13 DIAGNOSIS — N2581 Secondary hyperparathyroidism of renal origin: Secondary | ICD-10-CM | POA: Diagnosis not present

## 2022-07-13 DIAGNOSIS — D631 Anemia in chronic kidney disease: Secondary | ICD-10-CM | POA: Diagnosis not present

## 2022-07-16 DIAGNOSIS — N186 End stage renal disease: Secondary | ICD-10-CM | POA: Diagnosis not present

## 2022-07-16 DIAGNOSIS — D509 Iron deficiency anemia, unspecified: Secondary | ICD-10-CM | POA: Diagnosis not present

## 2022-07-16 DIAGNOSIS — D631 Anemia in chronic kidney disease: Secondary | ICD-10-CM | POA: Diagnosis not present

## 2022-07-16 DIAGNOSIS — Z992 Dependence on renal dialysis: Secondary | ICD-10-CM | POA: Diagnosis not present

## 2022-07-16 DIAGNOSIS — N2581 Secondary hyperparathyroidism of renal origin: Secondary | ICD-10-CM | POA: Diagnosis not present

## 2022-07-20 DIAGNOSIS — N186 End stage renal disease: Secondary | ICD-10-CM | POA: Diagnosis not present

## 2022-07-20 DIAGNOSIS — D509 Iron deficiency anemia, unspecified: Secondary | ICD-10-CM | POA: Diagnosis not present

## 2022-07-20 DIAGNOSIS — Z992 Dependence on renal dialysis: Secondary | ICD-10-CM | POA: Diagnosis not present

## 2022-07-20 DIAGNOSIS — D631 Anemia in chronic kidney disease: Secondary | ICD-10-CM | POA: Diagnosis not present

## 2022-07-20 DIAGNOSIS — N2581 Secondary hyperparathyroidism of renal origin: Secondary | ICD-10-CM | POA: Diagnosis not present

## 2022-07-27 DIAGNOSIS — N2581 Secondary hyperparathyroidism of renal origin: Secondary | ICD-10-CM | POA: Diagnosis not present

## 2022-07-27 DIAGNOSIS — D509 Iron deficiency anemia, unspecified: Secondary | ICD-10-CM | POA: Diagnosis not present

## 2022-07-27 DIAGNOSIS — Z992 Dependence on renal dialysis: Secondary | ICD-10-CM | POA: Diagnosis not present

## 2022-07-27 DIAGNOSIS — N186 End stage renal disease: Secondary | ICD-10-CM | POA: Diagnosis not present

## 2022-07-27 DIAGNOSIS — D631 Anemia in chronic kidney disease: Secondary | ICD-10-CM | POA: Diagnosis not present

## 2022-08-02 DIAGNOSIS — N186 End stage renal disease: Secondary | ICD-10-CM | POA: Diagnosis not present

## 2022-08-02 DIAGNOSIS — I129 Hypertensive chronic kidney disease with stage 1 through stage 4 chronic kidney disease, or unspecified chronic kidney disease: Secondary | ICD-10-CM | POA: Diagnosis not present

## 2022-08-02 DIAGNOSIS — Z992 Dependence on renal dialysis: Secondary | ICD-10-CM | POA: Diagnosis not present

## 2022-08-03 DIAGNOSIS — N186 End stage renal disease: Secondary | ICD-10-CM | POA: Diagnosis not present

## 2022-08-03 DIAGNOSIS — D631 Anemia in chronic kidney disease: Secondary | ICD-10-CM | POA: Diagnosis not present

## 2022-08-03 DIAGNOSIS — D509 Iron deficiency anemia, unspecified: Secondary | ICD-10-CM | POA: Diagnosis not present

## 2022-08-03 DIAGNOSIS — N2581 Secondary hyperparathyroidism of renal origin: Secondary | ICD-10-CM | POA: Diagnosis not present

## 2022-08-03 DIAGNOSIS — Z992 Dependence on renal dialysis: Secondary | ICD-10-CM | POA: Diagnosis not present

## 2022-08-10 DIAGNOSIS — Z992 Dependence on renal dialysis: Secondary | ICD-10-CM | POA: Diagnosis not present

## 2022-08-10 DIAGNOSIS — N2581 Secondary hyperparathyroidism of renal origin: Secondary | ICD-10-CM | POA: Diagnosis not present

## 2022-08-10 DIAGNOSIS — N186 End stage renal disease: Secondary | ICD-10-CM | POA: Diagnosis not present

## 2022-08-10 DIAGNOSIS — D509 Iron deficiency anemia, unspecified: Secondary | ICD-10-CM | POA: Diagnosis not present

## 2022-08-10 DIAGNOSIS — D631 Anemia in chronic kidney disease: Secondary | ICD-10-CM | POA: Diagnosis not present

## 2022-08-17 DIAGNOSIS — Z992 Dependence on renal dialysis: Secondary | ICD-10-CM | POA: Diagnosis not present

## 2022-08-17 DIAGNOSIS — N2581 Secondary hyperparathyroidism of renal origin: Secondary | ICD-10-CM | POA: Diagnosis not present

## 2022-08-17 DIAGNOSIS — D509 Iron deficiency anemia, unspecified: Secondary | ICD-10-CM | POA: Diagnosis not present

## 2022-08-17 DIAGNOSIS — D631 Anemia in chronic kidney disease: Secondary | ICD-10-CM | POA: Diagnosis not present

## 2022-08-17 DIAGNOSIS — N186 End stage renal disease: Secondary | ICD-10-CM | POA: Diagnosis not present

## 2022-08-20 ENCOUNTER — Other Ambulatory Visit: Payer: Self-pay

## 2022-08-20 DIAGNOSIS — D631 Anemia in chronic kidney disease: Secondary | ICD-10-CM | POA: Diagnosis not present

## 2022-08-20 DIAGNOSIS — Z992 Dependence on renal dialysis: Secondary | ICD-10-CM | POA: Diagnosis not present

## 2022-08-20 DIAGNOSIS — D509 Iron deficiency anemia, unspecified: Secondary | ICD-10-CM | POA: Diagnosis not present

## 2022-08-20 DIAGNOSIS — N2581 Secondary hyperparathyroidism of renal origin: Secondary | ICD-10-CM | POA: Diagnosis not present

## 2022-08-20 DIAGNOSIS — N186 End stage renal disease: Secondary | ICD-10-CM | POA: Diagnosis not present

## 2022-08-24 DIAGNOSIS — D509 Iron deficiency anemia, unspecified: Secondary | ICD-10-CM | POA: Diagnosis not present

## 2022-08-24 DIAGNOSIS — N186 End stage renal disease: Secondary | ICD-10-CM | POA: Diagnosis not present

## 2022-08-24 DIAGNOSIS — N2581 Secondary hyperparathyroidism of renal origin: Secondary | ICD-10-CM | POA: Diagnosis not present

## 2022-08-24 DIAGNOSIS — D631 Anemia in chronic kidney disease: Secondary | ICD-10-CM | POA: Diagnosis not present

## 2022-08-24 DIAGNOSIS — Z992 Dependence on renal dialysis: Secondary | ICD-10-CM | POA: Diagnosis not present

## 2022-08-27 DIAGNOSIS — D631 Anemia in chronic kidney disease: Secondary | ICD-10-CM | POA: Diagnosis not present

## 2022-08-27 DIAGNOSIS — Z992 Dependence on renal dialysis: Secondary | ICD-10-CM | POA: Diagnosis not present

## 2022-08-27 DIAGNOSIS — N2581 Secondary hyperparathyroidism of renal origin: Secondary | ICD-10-CM | POA: Diagnosis not present

## 2022-08-27 DIAGNOSIS — N186 End stage renal disease: Secondary | ICD-10-CM | POA: Diagnosis not present

## 2022-08-27 DIAGNOSIS — D509 Iron deficiency anemia, unspecified: Secondary | ICD-10-CM | POA: Diagnosis not present

## 2022-08-31 DIAGNOSIS — D631 Anemia in chronic kidney disease: Secondary | ICD-10-CM | POA: Diagnosis not present

## 2022-08-31 DIAGNOSIS — N186 End stage renal disease: Secondary | ICD-10-CM | POA: Diagnosis not present

## 2022-08-31 DIAGNOSIS — D509 Iron deficiency anemia, unspecified: Secondary | ICD-10-CM | POA: Diagnosis not present

## 2022-08-31 DIAGNOSIS — Z992 Dependence on renal dialysis: Secondary | ICD-10-CM | POA: Diagnosis not present

## 2022-08-31 DIAGNOSIS — N2581 Secondary hyperparathyroidism of renal origin: Secondary | ICD-10-CM | POA: Diagnosis not present

## 2022-09-02 DIAGNOSIS — N186 End stage renal disease: Secondary | ICD-10-CM | POA: Diagnosis not present

## 2022-09-02 DIAGNOSIS — Z992 Dependence on renal dialysis: Secondary | ICD-10-CM | POA: Diagnosis not present

## 2022-09-02 DIAGNOSIS — I129 Hypertensive chronic kidney disease with stage 1 through stage 4 chronic kidney disease, or unspecified chronic kidney disease: Secondary | ICD-10-CM | POA: Diagnosis not present

## 2022-09-07 DIAGNOSIS — D509 Iron deficiency anemia, unspecified: Secondary | ICD-10-CM | POA: Diagnosis not present

## 2022-09-07 DIAGNOSIS — N2581 Secondary hyperparathyroidism of renal origin: Secondary | ICD-10-CM | POA: Diagnosis not present

## 2022-09-07 DIAGNOSIS — D631 Anemia in chronic kidney disease: Secondary | ICD-10-CM | POA: Diagnosis not present

## 2022-09-07 DIAGNOSIS — N186 End stage renal disease: Secondary | ICD-10-CM | POA: Diagnosis not present

## 2022-09-07 DIAGNOSIS — Z992 Dependence on renal dialysis: Secondary | ICD-10-CM | POA: Diagnosis not present

## 2022-09-17 DIAGNOSIS — Z992 Dependence on renal dialysis: Secondary | ICD-10-CM | POA: Diagnosis not present

## 2022-09-17 DIAGNOSIS — N186 End stage renal disease: Secondary | ICD-10-CM | POA: Diagnosis not present

## 2022-09-17 DIAGNOSIS — D631 Anemia in chronic kidney disease: Secondary | ICD-10-CM | POA: Diagnosis not present

## 2022-09-17 DIAGNOSIS — D509 Iron deficiency anemia, unspecified: Secondary | ICD-10-CM | POA: Diagnosis not present

## 2022-09-17 DIAGNOSIS — N2581 Secondary hyperparathyroidism of renal origin: Secondary | ICD-10-CM | POA: Diagnosis not present

## 2022-09-19 DIAGNOSIS — D509 Iron deficiency anemia, unspecified: Secondary | ICD-10-CM | POA: Diagnosis not present

## 2022-09-19 DIAGNOSIS — N2581 Secondary hyperparathyroidism of renal origin: Secondary | ICD-10-CM | POA: Diagnosis not present

## 2022-09-19 DIAGNOSIS — Z992 Dependence on renal dialysis: Secondary | ICD-10-CM | POA: Diagnosis not present

## 2022-09-19 DIAGNOSIS — D631 Anemia in chronic kidney disease: Secondary | ICD-10-CM | POA: Diagnosis not present

## 2022-09-19 DIAGNOSIS — N186 End stage renal disease: Secondary | ICD-10-CM | POA: Diagnosis not present

## 2022-09-24 DIAGNOSIS — Z992 Dependence on renal dialysis: Secondary | ICD-10-CM | POA: Diagnosis not present

## 2022-09-24 DIAGNOSIS — D509 Iron deficiency anemia, unspecified: Secondary | ICD-10-CM | POA: Diagnosis not present

## 2022-09-24 DIAGNOSIS — N186 End stage renal disease: Secondary | ICD-10-CM | POA: Diagnosis not present

## 2022-09-24 DIAGNOSIS — D631 Anemia in chronic kidney disease: Secondary | ICD-10-CM | POA: Diagnosis not present

## 2022-09-24 DIAGNOSIS — N2581 Secondary hyperparathyroidism of renal origin: Secondary | ICD-10-CM | POA: Diagnosis not present

## 2022-09-26 ENCOUNTER — Other Ambulatory Visit: Payer: Self-pay

## 2022-09-26 DIAGNOSIS — Z992 Dependence on renal dialysis: Secondary | ICD-10-CM | POA: Diagnosis not present

## 2022-09-26 DIAGNOSIS — N2581 Secondary hyperparathyroidism of renal origin: Secondary | ICD-10-CM | POA: Diagnosis not present

## 2022-09-26 DIAGNOSIS — D631 Anemia in chronic kidney disease: Secondary | ICD-10-CM | POA: Diagnosis not present

## 2022-09-26 DIAGNOSIS — D509 Iron deficiency anemia, unspecified: Secondary | ICD-10-CM | POA: Diagnosis not present

## 2022-09-26 DIAGNOSIS — N186 End stage renal disease: Secondary | ICD-10-CM | POA: Diagnosis not present

## 2022-09-26 MED ORDER — HYDRALAZINE HCL 25 MG PO TABS
25.0000 mg | ORAL_TABLET | Freq: Two times a day (BID) | ORAL | 3 refills | Status: DC
  Filled 2022-09-26: qty 180, 90d supply, fill #0

## 2022-09-26 MED ORDER — AMLODIPINE BESYLATE 10 MG PO TABS
10.0000 mg | ORAL_TABLET | Freq: Every day | ORAL | 3 refills | Status: DC
  Filled 2022-09-26: qty 90, 90d supply, fill #0

## 2022-09-26 MED ORDER — CARVEDILOL 25 MG PO TABS
25.0000 mg | ORAL_TABLET | Freq: Two times a day (BID) | ORAL | 3 refills | Status: DC
  Filled 2022-09-26: qty 180, 90d supply, fill #0

## 2022-09-28 DIAGNOSIS — D509 Iron deficiency anemia, unspecified: Secondary | ICD-10-CM | POA: Diagnosis not present

## 2022-09-28 DIAGNOSIS — D631 Anemia in chronic kidney disease: Secondary | ICD-10-CM | POA: Diagnosis not present

## 2022-09-28 DIAGNOSIS — Z992 Dependence on renal dialysis: Secondary | ICD-10-CM | POA: Diagnosis not present

## 2022-09-28 DIAGNOSIS — N2581 Secondary hyperparathyroidism of renal origin: Secondary | ICD-10-CM | POA: Diagnosis not present

## 2022-09-28 DIAGNOSIS — N186 End stage renal disease: Secondary | ICD-10-CM | POA: Diagnosis not present

## 2022-10-01 DIAGNOSIS — D631 Anemia in chronic kidney disease: Secondary | ICD-10-CM | POA: Diagnosis not present

## 2022-10-01 DIAGNOSIS — N2581 Secondary hyperparathyroidism of renal origin: Secondary | ICD-10-CM | POA: Diagnosis not present

## 2022-10-01 DIAGNOSIS — D509 Iron deficiency anemia, unspecified: Secondary | ICD-10-CM | POA: Diagnosis not present

## 2022-10-01 DIAGNOSIS — N186 End stage renal disease: Secondary | ICD-10-CM | POA: Diagnosis not present

## 2022-10-01 DIAGNOSIS — Z992 Dependence on renal dialysis: Secondary | ICD-10-CM | POA: Diagnosis not present

## 2022-10-03 ENCOUNTER — Other Ambulatory Visit: Payer: Self-pay

## 2022-10-03 DIAGNOSIS — N2581 Secondary hyperparathyroidism of renal origin: Secondary | ICD-10-CM | POA: Diagnosis not present

## 2022-10-03 DIAGNOSIS — Z992 Dependence on renal dialysis: Secondary | ICD-10-CM | POA: Diagnosis not present

## 2022-10-03 DIAGNOSIS — D509 Iron deficiency anemia, unspecified: Secondary | ICD-10-CM | POA: Diagnosis not present

## 2022-10-03 DIAGNOSIS — N186 End stage renal disease: Secondary | ICD-10-CM | POA: Diagnosis not present

## 2022-10-08 DIAGNOSIS — N2581 Secondary hyperparathyroidism of renal origin: Secondary | ICD-10-CM | POA: Diagnosis not present

## 2022-10-08 DIAGNOSIS — N186 End stage renal disease: Secondary | ICD-10-CM | POA: Diagnosis not present

## 2022-10-08 DIAGNOSIS — D509 Iron deficiency anemia, unspecified: Secondary | ICD-10-CM | POA: Diagnosis not present

## 2022-10-08 DIAGNOSIS — Z992 Dependence on renal dialysis: Secondary | ICD-10-CM | POA: Diagnosis not present

## 2022-10-10 DIAGNOSIS — N186 End stage renal disease: Secondary | ICD-10-CM | POA: Diagnosis not present

## 2022-10-10 DIAGNOSIS — N2581 Secondary hyperparathyroidism of renal origin: Secondary | ICD-10-CM | POA: Diagnosis not present

## 2022-10-10 DIAGNOSIS — Z992 Dependence on renal dialysis: Secondary | ICD-10-CM | POA: Diagnosis not present

## 2022-10-10 DIAGNOSIS — D509 Iron deficiency anemia, unspecified: Secondary | ICD-10-CM | POA: Diagnosis not present

## 2022-10-15 DIAGNOSIS — N186 End stage renal disease: Secondary | ICD-10-CM | POA: Diagnosis not present

## 2022-10-15 DIAGNOSIS — N2581 Secondary hyperparathyroidism of renal origin: Secondary | ICD-10-CM | POA: Diagnosis not present

## 2022-10-15 DIAGNOSIS — D509 Iron deficiency anemia, unspecified: Secondary | ICD-10-CM | POA: Diagnosis not present

## 2022-10-15 DIAGNOSIS — Z992 Dependence on renal dialysis: Secondary | ICD-10-CM | POA: Diagnosis not present

## 2022-10-19 DIAGNOSIS — Z992 Dependence on renal dialysis: Secondary | ICD-10-CM | POA: Diagnosis not present

## 2022-10-19 DIAGNOSIS — D509 Iron deficiency anemia, unspecified: Secondary | ICD-10-CM | POA: Diagnosis not present

## 2022-10-19 DIAGNOSIS — N2581 Secondary hyperparathyroidism of renal origin: Secondary | ICD-10-CM | POA: Diagnosis not present

## 2022-10-19 DIAGNOSIS — N186 End stage renal disease: Secondary | ICD-10-CM | POA: Diagnosis not present

## 2022-10-22 DIAGNOSIS — Z992 Dependence on renal dialysis: Secondary | ICD-10-CM | POA: Diagnosis not present

## 2022-10-22 DIAGNOSIS — D509 Iron deficiency anemia, unspecified: Secondary | ICD-10-CM | POA: Diagnosis not present

## 2022-10-22 DIAGNOSIS — N2581 Secondary hyperparathyroidism of renal origin: Secondary | ICD-10-CM | POA: Diagnosis not present

## 2022-10-22 DIAGNOSIS — N186 End stage renal disease: Secondary | ICD-10-CM | POA: Diagnosis not present

## 2022-10-29 DIAGNOSIS — N2581 Secondary hyperparathyroidism of renal origin: Secondary | ICD-10-CM | POA: Diagnosis not present

## 2022-10-29 DIAGNOSIS — Z992 Dependence on renal dialysis: Secondary | ICD-10-CM | POA: Diagnosis not present

## 2022-10-29 DIAGNOSIS — N186 End stage renal disease: Secondary | ICD-10-CM | POA: Diagnosis not present

## 2022-10-29 DIAGNOSIS — D509 Iron deficiency anemia, unspecified: Secondary | ICD-10-CM | POA: Diagnosis not present

## 2022-11-01 DIAGNOSIS — N186 End stage renal disease: Secondary | ICD-10-CM | POA: Diagnosis not present

## 2022-11-01 DIAGNOSIS — I129 Hypertensive chronic kidney disease with stage 1 through stage 4 chronic kidney disease, or unspecified chronic kidney disease: Secondary | ICD-10-CM | POA: Diagnosis not present

## 2022-11-01 DIAGNOSIS — Z992 Dependence on renal dialysis: Secondary | ICD-10-CM | POA: Diagnosis not present

## 2022-11-02 DIAGNOSIS — Z992 Dependence on renal dialysis: Secondary | ICD-10-CM | POA: Diagnosis not present

## 2022-11-02 DIAGNOSIS — D631 Anemia in chronic kidney disease: Secondary | ICD-10-CM | POA: Diagnosis not present

## 2022-11-02 DIAGNOSIS — N186 End stage renal disease: Secondary | ICD-10-CM | POA: Diagnosis not present

## 2022-11-02 DIAGNOSIS — N2581 Secondary hyperparathyroidism of renal origin: Secondary | ICD-10-CM | POA: Diagnosis not present

## 2022-11-02 DIAGNOSIS — D509 Iron deficiency anemia, unspecified: Secondary | ICD-10-CM | POA: Diagnosis not present

## 2022-11-05 DIAGNOSIS — N186 End stage renal disease: Secondary | ICD-10-CM | POA: Diagnosis not present

## 2022-11-05 DIAGNOSIS — Z992 Dependence on renal dialysis: Secondary | ICD-10-CM | POA: Diagnosis not present

## 2022-11-05 DIAGNOSIS — D509 Iron deficiency anemia, unspecified: Secondary | ICD-10-CM | POA: Diagnosis not present

## 2022-11-05 DIAGNOSIS — N2581 Secondary hyperparathyroidism of renal origin: Secondary | ICD-10-CM | POA: Diagnosis not present

## 2022-11-05 DIAGNOSIS — D631 Anemia in chronic kidney disease: Secondary | ICD-10-CM | POA: Diagnosis not present

## 2022-11-09 DIAGNOSIS — N186 End stage renal disease: Secondary | ICD-10-CM | POA: Diagnosis not present

## 2022-11-09 DIAGNOSIS — D509 Iron deficiency anemia, unspecified: Secondary | ICD-10-CM | POA: Diagnosis not present

## 2022-11-09 DIAGNOSIS — Z992 Dependence on renal dialysis: Secondary | ICD-10-CM | POA: Diagnosis not present

## 2022-11-09 DIAGNOSIS — D631 Anemia in chronic kidney disease: Secondary | ICD-10-CM | POA: Diagnosis not present

## 2022-11-09 DIAGNOSIS — N2581 Secondary hyperparathyroidism of renal origin: Secondary | ICD-10-CM | POA: Diagnosis not present

## 2022-11-12 DIAGNOSIS — Z992 Dependence on renal dialysis: Secondary | ICD-10-CM | POA: Diagnosis not present

## 2022-11-12 DIAGNOSIS — N186 End stage renal disease: Secondary | ICD-10-CM | POA: Diagnosis not present

## 2022-11-12 DIAGNOSIS — N2581 Secondary hyperparathyroidism of renal origin: Secondary | ICD-10-CM | POA: Diagnosis not present

## 2022-11-12 DIAGNOSIS — D509 Iron deficiency anemia, unspecified: Secondary | ICD-10-CM | POA: Diagnosis not present

## 2022-11-12 DIAGNOSIS — D631 Anemia in chronic kidney disease: Secondary | ICD-10-CM | POA: Diagnosis not present

## 2022-11-16 DIAGNOSIS — N2581 Secondary hyperparathyroidism of renal origin: Secondary | ICD-10-CM | POA: Diagnosis not present

## 2022-11-16 DIAGNOSIS — D631 Anemia in chronic kidney disease: Secondary | ICD-10-CM | POA: Diagnosis not present

## 2022-11-16 DIAGNOSIS — D509 Iron deficiency anemia, unspecified: Secondary | ICD-10-CM | POA: Diagnosis not present

## 2022-11-16 DIAGNOSIS — Z992 Dependence on renal dialysis: Secondary | ICD-10-CM | POA: Diagnosis not present

## 2022-11-16 DIAGNOSIS — N186 End stage renal disease: Secondary | ICD-10-CM | POA: Diagnosis not present

## 2022-11-18 ENCOUNTER — Telehealth: Payer: Self-pay | Admitting: Licensed Clinical Social Worker

## 2022-11-18 NOTE — Patient Outreach (Signed)
  Care Coordination   11/18/2022 Name: Patrick Ibarra MRN: [id number] DOB: February 02, 1981   Care Coordination Outreach Attempts:  An unsuccessful telephone outreach was attempted today to offer the patient information about available care coordination services as a benefit of their health plan.   Follow Up Plan:  No further outreach attempts will be made at this time. We have been unable to contact the patient to offer or enroll patient in care coordination services  Encounter Outcome:  No Answer   Care Coordination Interventions:  No, not indicated    Christa See, MSW, [email or web address] Phone [phone] 2:09 PM

## 2022-11-21 DIAGNOSIS — D509 Iron deficiency anemia, unspecified: Secondary | ICD-10-CM | POA: Diagnosis not present

## 2022-11-21 DIAGNOSIS — N186 End stage renal disease: Secondary | ICD-10-CM | POA: Diagnosis not present

## 2022-11-21 DIAGNOSIS — N2581 Secondary hyperparathyroidism of renal origin: Secondary | ICD-10-CM | POA: Diagnosis not present

## 2022-11-21 DIAGNOSIS — Z992 Dependence on renal dialysis: Secondary | ICD-10-CM | POA: Diagnosis not present

## 2022-11-21 DIAGNOSIS — D631 Anemia in chronic kidney disease: Secondary | ICD-10-CM | POA: Diagnosis not present

## 2022-11-23 DIAGNOSIS — D509 Iron deficiency anemia, unspecified: Secondary | ICD-10-CM | POA: Diagnosis not present

## 2022-11-23 DIAGNOSIS — D631 Anemia in chronic kidney disease: Secondary | ICD-10-CM | POA: Diagnosis not present

## 2022-11-23 DIAGNOSIS — Z992 Dependence on renal dialysis: Secondary | ICD-10-CM | POA: Diagnosis not present

## 2022-11-23 DIAGNOSIS — N2581 Secondary hyperparathyroidism of renal origin: Secondary | ICD-10-CM | POA: Diagnosis not present

## 2022-11-23 DIAGNOSIS — N186 End stage renal disease: Secondary | ICD-10-CM | POA: Diagnosis not present

## 2022-11-26 DIAGNOSIS — N2581 Secondary hyperparathyroidism of renal origin: Secondary | ICD-10-CM | POA: Diagnosis not present

## 2022-11-26 DIAGNOSIS — N186 End stage renal disease: Secondary | ICD-10-CM | POA: Diagnosis not present

## 2022-11-26 DIAGNOSIS — D631 Anemia in chronic kidney disease: Secondary | ICD-10-CM | POA: Diagnosis not present

## 2022-11-26 DIAGNOSIS — D509 Iron deficiency anemia, unspecified: Secondary | ICD-10-CM | POA: Diagnosis not present

## 2022-11-26 DIAGNOSIS — Z992 Dependence on renal dialysis: Secondary | ICD-10-CM | POA: Diagnosis not present

## 2022-11-30 DIAGNOSIS — N2581 Secondary hyperparathyroidism of renal origin: Secondary | ICD-10-CM | POA: Diagnosis not present

## 2022-11-30 DIAGNOSIS — Z992 Dependence on renal dialysis: Secondary | ICD-10-CM | POA: Diagnosis not present

## 2022-11-30 DIAGNOSIS — N186 End stage renal disease: Secondary | ICD-10-CM | POA: Diagnosis not present

## 2022-11-30 DIAGNOSIS — D631 Anemia in chronic kidney disease: Secondary | ICD-10-CM | POA: Diagnosis not present

## 2022-11-30 DIAGNOSIS — D509 Iron deficiency anemia, unspecified: Secondary | ICD-10-CM | POA: Diagnosis not present

## 2022-12-02 DIAGNOSIS — I129 Hypertensive chronic kidney disease with stage 1 through stage 4 chronic kidney disease, or unspecified chronic kidney disease: Secondary | ICD-10-CM | POA: Diagnosis not present

## 2022-12-02 DIAGNOSIS — Z992 Dependence on renal dialysis: Secondary | ICD-10-CM | POA: Diagnosis not present

## 2022-12-02 DIAGNOSIS — N186 End stage renal disease: Secondary | ICD-10-CM | POA: Diagnosis not present

## 2022-12-05 DIAGNOSIS — N2581 Secondary hyperparathyroidism of renal origin: Secondary | ICD-10-CM | POA: Diagnosis not present

## 2022-12-05 DIAGNOSIS — D509 Iron deficiency anemia, unspecified: Secondary | ICD-10-CM | POA: Diagnosis not present

## 2022-12-05 DIAGNOSIS — Z992 Dependence on renal dialysis: Secondary | ICD-10-CM | POA: Diagnosis not present

## 2022-12-05 DIAGNOSIS — N186 End stage renal disease: Secondary | ICD-10-CM | POA: Diagnosis not present

## 2022-12-10 DIAGNOSIS — Z992 Dependence on renal dialysis: Secondary | ICD-10-CM | POA: Diagnosis not present

## 2022-12-10 DIAGNOSIS — N186 End stage renal disease: Secondary | ICD-10-CM | POA: Diagnosis not present

## 2022-12-10 DIAGNOSIS — D509 Iron deficiency anemia, unspecified: Secondary | ICD-10-CM | POA: Diagnosis not present

## 2022-12-10 DIAGNOSIS — N2581 Secondary hyperparathyroidism of renal origin: Secondary | ICD-10-CM | POA: Diagnosis not present

## 2022-12-14 DIAGNOSIS — D509 Iron deficiency anemia, unspecified: Secondary | ICD-10-CM | POA: Diagnosis not present

## 2022-12-14 DIAGNOSIS — N186 End stage renal disease: Secondary | ICD-10-CM | POA: Diagnosis not present

## 2022-12-14 DIAGNOSIS — N2581 Secondary hyperparathyroidism of renal origin: Secondary | ICD-10-CM | POA: Diagnosis not present

## 2022-12-14 DIAGNOSIS — Z992 Dependence on renal dialysis: Secondary | ICD-10-CM | POA: Diagnosis not present

## 2022-12-17 DIAGNOSIS — N186 End stage renal disease: Secondary | ICD-10-CM | POA: Diagnosis not present

## 2022-12-17 DIAGNOSIS — Z992 Dependence on renal dialysis: Secondary | ICD-10-CM | POA: Diagnosis not present

## 2022-12-17 DIAGNOSIS — D509 Iron deficiency anemia, unspecified: Secondary | ICD-10-CM | POA: Diagnosis not present

## 2022-12-17 DIAGNOSIS — N2581 Secondary hyperparathyroidism of renal origin: Secondary | ICD-10-CM | POA: Diagnosis not present

## 2022-12-19 DIAGNOSIS — D509 Iron deficiency anemia, unspecified: Secondary | ICD-10-CM | POA: Diagnosis not present

## 2022-12-19 DIAGNOSIS — Z992 Dependence on renal dialysis: Secondary | ICD-10-CM | POA: Diagnosis not present

## 2022-12-19 DIAGNOSIS — N2581 Secondary hyperparathyroidism of renal origin: Secondary | ICD-10-CM | POA: Diagnosis not present

## 2022-12-19 DIAGNOSIS — N186 End stage renal disease: Secondary | ICD-10-CM | POA: Diagnosis not present

## 2022-12-24 DIAGNOSIS — D509 Iron deficiency anemia, unspecified: Secondary | ICD-10-CM | POA: Diagnosis not present

## 2022-12-24 DIAGNOSIS — N2581 Secondary hyperparathyroidism of renal origin: Secondary | ICD-10-CM | POA: Diagnosis not present

## 2022-12-24 DIAGNOSIS — N186 End stage renal disease: Secondary | ICD-10-CM | POA: Diagnosis not present

## 2022-12-24 DIAGNOSIS — Z992 Dependence on renal dialysis: Secondary | ICD-10-CM | POA: Diagnosis not present

## 2022-12-26 DIAGNOSIS — N186 End stage renal disease: Secondary | ICD-10-CM | POA: Diagnosis not present

## 2022-12-26 DIAGNOSIS — N2581 Secondary hyperparathyroidism of renal origin: Secondary | ICD-10-CM | POA: Diagnosis not present

## 2022-12-26 DIAGNOSIS — Z992 Dependence on renal dialysis: Secondary | ICD-10-CM | POA: Diagnosis not present

## 2022-12-26 DIAGNOSIS — D509 Iron deficiency anemia, unspecified: Secondary | ICD-10-CM | POA: Diagnosis not present

## 2022-12-30 ENCOUNTER — Other Ambulatory Visit: Payer: Self-pay

## 2022-12-31 ENCOUNTER — Other Ambulatory Visit: Payer: Self-pay

## 2022-12-31 DIAGNOSIS — N2581 Secondary hyperparathyroidism of renal origin: Secondary | ICD-10-CM | POA: Diagnosis not present

## 2022-12-31 DIAGNOSIS — Z992 Dependence on renal dialysis: Secondary | ICD-10-CM | POA: Diagnosis not present

## 2022-12-31 DIAGNOSIS — D509 Iron deficiency anemia, unspecified: Secondary | ICD-10-CM | POA: Diagnosis not present

## 2022-12-31 DIAGNOSIS — N186 End stage renal disease: Secondary | ICD-10-CM | POA: Diagnosis not present

## 2023-01-01 DIAGNOSIS — I129 Hypertensive chronic kidney disease with stage 1 through stage 4 chronic kidney disease, or unspecified chronic kidney disease: Secondary | ICD-10-CM | POA: Diagnosis not present

## 2023-01-01 DIAGNOSIS — Z992 Dependence on renal dialysis: Secondary | ICD-10-CM | POA: Diagnosis not present

## 2023-01-01 DIAGNOSIS — N186 End stage renal disease: Secondary | ICD-10-CM | POA: Diagnosis not present

## 2023-01-02 DIAGNOSIS — Z992 Dependence on renal dialysis: Secondary | ICD-10-CM | POA: Diagnosis not present

## 2023-01-02 DIAGNOSIS — N186 End stage renal disease: Secondary | ICD-10-CM | POA: Diagnosis not present

## 2023-01-02 DIAGNOSIS — D509 Iron deficiency anemia, unspecified: Secondary | ICD-10-CM | POA: Diagnosis not present

## 2023-01-02 DIAGNOSIS — N2581 Secondary hyperparathyroidism of renal origin: Secondary | ICD-10-CM | POA: Diagnosis not present

## 2023-01-04 DIAGNOSIS — Z992 Dependence on renal dialysis: Secondary | ICD-10-CM | POA: Diagnosis not present

## 2023-01-04 DIAGNOSIS — N2581 Secondary hyperparathyroidism of renal origin: Secondary | ICD-10-CM | POA: Diagnosis not present

## 2023-01-04 DIAGNOSIS — N186 End stage renal disease: Secondary | ICD-10-CM | POA: Diagnosis not present

## 2023-01-04 DIAGNOSIS — D509 Iron deficiency anemia, unspecified: Secondary | ICD-10-CM | POA: Diagnosis not present

## 2023-01-07 DIAGNOSIS — Z992 Dependence on renal dialysis: Secondary | ICD-10-CM | POA: Diagnosis not present

## 2023-01-07 DIAGNOSIS — D509 Iron deficiency anemia, unspecified: Secondary | ICD-10-CM | POA: Diagnosis not present

## 2023-01-07 DIAGNOSIS — N186 End stage renal disease: Secondary | ICD-10-CM | POA: Diagnosis not present

## 2023-01-07 DIAGNOSIS — N2581 Secondary hyperparathyroidism of renal origin: Secondary | ICD-10-CM | POA: Diagnosis not present

## 2023-01-11 DIAGNOSIS — D509 Iron deficiency anemia, unspecified: Secondary | ICD-10-CM | POA: Diagnosis not present

## 2023-01-11 DIAGNOSIS — N2581 Secondary hyperparathyroidism of renal origin: Secondary | ICD-10-CM | POA: Diagnosis not present

## 2023-01-11 DIAGNOSIS — N186 End stage renal disease: Secondary | ICD-10-CM | POA: Diagnosis not present

## 2023-01-11 DIAGNOSIS — Z992 Dependence on renal dialysis: Secondary | ICD-10-CM | POA: Diagnosis not present

## 2023-01-14 DIAGNOSIS — D509 Iron deficiency anemia, unspecified: Secondary | ICD-10-CM | POA: Diagnosis not present

## 2023-01-14 DIAGNOSIS — Z992 Dependence on renal dialysis: Secondary | ICD-10-CM | POA: Diagnosis not present

## 2023-01-14 DIAGNOSIS — N2581 Secondary hyperparathyroidism of renal origin: Secondary | ICD-10-CM | POA: Diagnosis not present

## 2023-01-14 DIAGNOSIS — N186 End stage renal disease: Secondary | ICD-10-CM | POA: Diagnosis not present

## 2023-01-18 DIAGNOSIS — N2581 Secondary hyperparathyroidism of renal origin: Secondary | ICD-10-CM | POA: Diagnosis not present

## 2023-01-18 DIAGNOSIS — N186 End stage renal disease: Secondary | ICD-10-CM | POA: Diagnosis not present

## 2023-01-18 DIAGNOSIS — D509 Iron deficiency anemia, unspecified: Secondary | ICD-10-CM | POA: Diagnosis not present

## 2023-01-18 DIAGNOSIS — Z992 Dependence on renal dialysis: Secondary | ICD-10-CM | POA: Diagnosis not present

## 2023-01-28 DIAGNOSIS — N2581 Secondary hyperparathyroidism of renal origin: Secondary | ICD-10-CM | POA: Diagnosis not present

## 2023-01-28 DIAGNOSIS — Z992 Dependence on renal dialysis: Secondary | ICD-10-CM | POA: Diagnosis not present

## 2023-01-28 DIAGNOSIS — D509 Iron deficiency anemia, unspecified: Secondary | ICD-10-CM | POA: Diagnosis not present

## 2023-01-28 DIAGNOSIS — N186 End stage renal disease: Secondary | ICD-10-CM | POA: Diagnosis not present

## 2023-01-30 DIAGNOSIS — Z992 Dependence on renal dialysis: Secondary | ICD-10-CM | POA: Diagnosis not present

## 2023-01-30 DIAGNOSIS — N186 End stage renal disease: Secondary | ICD-10-CM | POA: Diagnosis not present

## 2023-01-30 DIAGNOSIS — D509 Iron deficiency anemia, unspecified: Secondary | ICD-10-CM | POA: Diagnosis not present

## 2023-01-30 DIAGNOSIS — N2581 Secondary hyperparathyroidism of renal origin: Secondary | ICD-10-CM | POA: Diagnosis not present

## 2023-02-01 DIAGNOSIS — N2581 Secondary hyperparathyroidism of renal origin: Secondary | ICD-10-CM | POA: Diagnosis not present

## 2023-02-01 DIAGNOSIS — D509 Iron deficiency anemia, unspecified: Secondary | ICD-10-CM | POA: Diagnosis not present

## 2023-02-01 DIAGNOSIS — N186 End stage renal disease: Secondary | ICD-10-CM | POA: Diagnosis not present

## 2023-02-01 DIAGNOSIS — I129 Hypertensive chronic kidney disease with stage 1 through stage 4 chronic kidney disease, or unspecified chronic kidney disease: Secondary | ICD-10-CM | POA: Diagnosis not present

## 2023-02-01 DIAGNOSIS — Z992 Dependence on renal dialysis: Secondary | ICD-10-CM | POA: Diagnosis not present

## 2023-02-01 DIAGNOSIS — D631 Anemia in chronic kidney disease: Secondary | ICD-10-CM | POA: Diagnosis not present

## 2023-02-04 DIAGNOSIS — N186 End stage renal disease: Secondary | ICD-10-CM | POA: Diagnosis not present

## 2023-02-04 DIAGNOSIS — D631 Anemia in chronic kidney disease: Secondary | ICD-10-CM | POA: Diagnosis not present

## 2023-02-04 DIAGNOSIS — D509 Iron deficiency anemia, unspecified: Secondary | ICD-10-CM | POA: Diagnosis not present

## 2023-02-04 DIAGNOSIS — Z992 Dependence on renal dialysis: Secondary | ICD-10-CM | POA: Diagnosis not present

## 2023-02-04 DIAGNOSIS — N2581 Secondary hyperparathyroidism of renal origin: Secondary | ICD-10-CM | POA: Diagnosis not present

## 2023-02-08 DIAGNOSIS — N186 End stage renal disease: Secondary | ICD-10-CM | POA: Diagnosis not present

## 2023-02-08 DIAGNOSIS — D631 Anemia in chronic kidney disease: Secondary | ICD-10-CM | POA: Diagnosis not present

## 2023-02-08 DIAGNOSIS — D509 Iron deficiency anemia, unspecified: Secondary | ICD-10-CM | POA: Diagnosis not present

## 2023-02-08 DIAGNOSIS — Z992 Dependence on renal dialysis: Secondary | ICD-10-CM | POA: Diagnosis not present

## 2023-02-08 DIAGNOSIS — N2581 Secondary hyperparathyroidism of renal origin: Secondary | ICD-10-CM | POA: Diagnosis not present

## 2023-02-13 DIAGNOSIS — N2581 Secondary hyperparathyroidism of renal origin: Secondary | ICD-10-CM | POA: Diagnosis not present

## 2023-02-13 DIAGNOSIS — N186 End stage renal disease: Secondary | ICD-10-CM | POA: Diagnosis not present

## 2023-02-13 DIAGNOSIS — Z992 Dependence on renal dialysis: Secondary | ICD-10-CM | POA: Diagnosis not present

## 2023-02-13 DIAGNOSIS — D509 Iron deficiency anemia, unspecified: Secondary | ICD-10-CM | POA: Diagnosis not present

## 2023-02-13 DIAGNOSIS — D631 Anemia in chronic kidney disease: Secondary | ICD-10-CM | POA: Diagnosis not present

## 2023-02-18 DIAGNOSIS — D631 Anemia in chronic kidney disease: Secondary | ICD-10-CM | POA: Diagnosis not present

## 2023-02-18 DIAGNOSIS — N2581 Secondary hyperparathyroidism of renal origin: Secondary | ICD-10-CM | POA: Diagnosis not present

## 2023-02-18 DIAGNOSIS — Z992 Dependence on renal dialysis: Secondary | ICD-10-CM | POA: Diagnosis not present

## 2023-02-18 DIAGNOSIS — D509 Iron deficiency anemia, unspecified: Secondary | ICD-10-CM | POA: Diagnosis not present

## 2023-02-18 DIAGNOSIS — N186 End stage renal disease: Secondary | ICD-10-CM | POA: Diagnosis not present

## 2023-02-22 DIAGNOSIS — D631 Anemia in chronic kidney disease: Secondary | ICD-10-CM | POA: Diagnosis not present

## 2023-02-22 DIAGNOSIS — D509 Iron deficiency anemia, unspecified: Secondary | ICD-10-CM | POA: Diagnosis not present

## 2023-02-22 DIAGNOSIS — N2581 Secondary hyperparathyroidism of renal origin: Secondary | ICD-10-CM | POA: Diagnosis not present

## 2023-02-22 DIAGNOSIS — N186 End stage renal disease: Secondary | ICD-10-CM | POA: Diagnosis not present

## 2023-02-22 DIAGNOSIS — Z992 Dependence on renal dialysis: Secondary | ICD-10-CM | POA: Diagnosis not present

## 2023-02-27 DIAGNOSIS — D509 Iron deficiency anemia, unspecified: Secondary | ICD-10-CM | POA: Diagnosis not present

## 2023-02-27 DIAGNOSIS — N2581 Secondary hyperparathyroidism of renal origin: Secondary | ICD-10-CM | POA: Diagnosis not present

## 2023-02-27 DIAGNOSIS — D631 Anemia in chronic kidney disease: Secondary | ICD-10-CM | POA: Diagnosis not present

## 2023-02-27 DIAGNOSIS — Z992 Dependence on renal dialysis: Secondary | ICD-10-CM | POA: Diagnosis not present

## 2023-02-27 DIAGNOSIS — N186 End stage renal disease: Secondary | ICD-10-CM | POA: Diagnosis not present

## 2023-03-03 DIAGNOSIS — Z992 Dependence on renal dialysis: Secondary | ICD-10-CM | POA: Diagnosis not present

## 2023-03-03 DIAGNOSIS — N186 End stage renal disease: Secondary | ICD-10-CM | POA: Diagnosis not present

## 2023-03-03 DIAGNOSIS — I129 Hypertensive chronic kidney disease with stage 1 through stage 4 chronic kidney disease, or unspecified chronic kidney disease: Secondary | ICD-10-CM | POA: Diagnosis not present

## 2023-03-04 DIAGNOSIS — D631 Anemia in chronic kidney disease: Secondary | ICD-10-CM | POA: Diagnosis not present

## 2023-03-04 DIAGNOSIS — D509 Iron deficiency anemia, unspecified: Secondary | ICD-10-CM | POA: Diagnosis not present

## 2023-03-04 DIAGNOSIS — Z992 Dependence on renal dialysis: Secondary | ICD-10-CM | POA: Diagnosis not present

## 2023-03-04 DIAGNOSIS — N186 End stage renal disease: Secondary | ICD-10-CM | POA: Diagnosis not present

## 2023-03-04 DIAGNOSIS — N2581 Secondary hyperparathyroidism of renal origin: Secondary | ICD-10-CM | POA: Diagnosis not present

## 2023-03-08 DIAGNOSIS — N186 End stage renal disease: Secondary | ICD-10-CM | POA: Diagnosis not present

## 2023-03-08 DIAGNOSIS — Z992 Dependence on renal dialysis: Secondary | ICD-10-CM | POA: Diagnosis not present

## 2023-03-08 DIAGNOSIS — D509 Iron deficiency anemia, unspecified: Secondary | ICD-10-CM | POA: Diagnosis not present

## 2023-03-08 DIAGNOSIS — N2581 Secondary hyperparathyroidism of renal origin: Secondary | ICD-10-CM | POA: Diagnosis not present

## 2023-03-08 DIAGNOSIS — D631 Anemia in chronic kidney disease: Secondary | ICD-10-CM | POA: Diagnosis not present

## 2023-03-13 DIAGNOSIS — D509 Iron deficiency anemia, unspecified: Secondary | ICD-10-CM | POA: Diagnosis not present

## 2023-03-13 DIAGNOSIS — D631 Anemia in chronic kidney disease: Secondary | ICD-10-CM | POA: Diagnosis not present

## 2023-03-13 DIAGNOSIS — N186 End stage renal disease: Secondary | ICD-10-CM | POA: Diagnosis not present

## 2023-03-13 DIAGNOSIS — N2581 Secondary hyperparathyroidism of renal origin: Secondary | ICD-10-CM | POA: Diagnosis not present

## 2023-03-13 DIAGNOSIS — Z992 Dependence on renal dialysis: Secondary | ICD-10-CM | POA: Diagnosis not present

## 2023-03-15 DIAGNOSIS — Z992 Dependence on renal dialysis: Secondary | ICD-10-CM | POA: Diagnosis not present

## 2023-03-15 DIAGNOSIS — N2581 Secondary hyperparathyroidism of renal origin: Secondary | ICD-10-CM | POA: Diagnosis not present

## 2023-03-15 DIAGNOSIS — D509 Iron deficiency anemia, unspecified: Secondary | ICD-10-CM | POA: Diagnosis not present

## 2023-03-15 DIAGNOSIS — D631 Anemia in chronic kidney disease: Secondary | ICD-10-CM | POA: Diagnosis not present

## 2023-03-15 DIAGNOSIS — N186 End stage renal disease: Secondary | ICD-10-CM | POA: Diagnosis not present

## 2023-03-18 DIAGNOSIS — D509 Iron deficiency anemia, unspecified: Secondary | ICD-10-CM | POA: Diagnosis not present

## 2023-03-18 DIAGNOSIS — Z992 Dependence on renal dialysis: Secondary | ICD-10-CM | POA: Diagnosis not present

## 2023-03-18 DIAGNOSIS — N2581 Secondary hyperparathyroidism of renal origin: Secondary | ICD-10-CM | POA: Diagnosis not present

## 2023-03-18 DIAGNOSIS — N186 End stage renal disease: Secondary | ICD-10-CM | POA: Diagnosis not present

## 2023-03-18 DIAGNOSIS — D631 Anemia in chronic kidney disease: Secondary | ICD-10-CM | POA: Diagnosis not present

## 2023-03-22 DIAGNOSIS — D509 Iron deficiency anemia, unspecified: Secondary | ICD-10-CM | POA: Diagnosis not present

## 2023-03-22 DIAGNOSIS — N186 End stage renal disease: Secondary | ICD-10-CM | POA: Diagnosis not present

## 2023-03-22 DIAGNOSIS — Z992 Dependence on renal dialysis: Secondary | ICD-10-CM | POA: Diagnosis not present

## 2023-03-22 DIAGNOSIS — D631 Anemia in chronic kidney disease: Secondary | ICD-10-CM | POA: Diagnosis not present

## 2023-03-22 DIAGNOSIS — N2581 Secondary hyperparathyroidism of renal origin: Secondary | ICD-10-CM | POA: Diagnosis not present

## 2023-03-27 DIAGNOSIS — D509 Iron deficiency anemia, unspecified: Secondary | ICD-10-CM | POA: Diagnosis not present

## 2023-03-27 DIAGNOSIS — N2581 Secondary hyperparathyroidism of renal origin: Secondary | ICD-10-CM | POA: Diagnosis not present

## 2023-03-27 DIAGNOSIS — N186 End stage renal disease: Secondary | ICD-10-CM | POA: Diagnosis not present

## 2023-03-27 DIAGNOSIS — D631 Anemia in chronic kidney disease: Secondary | ICD-10-CM | POA: Diagnosis not present

## 2023-03-27 DIAGNOSIS — Z992 Dependence on renal dialysis: Secondary | ICD-10-CM | POA: Diagnosis not present

## 2023-04-01 DIAGNOSIS — D509 Iron deficiency anemia, unspecified: Secondary | ICD-10-CM | POA: Diagnosis not present

## 2023-04-01 DIAGNOSIS — N186 End stage renal disease: Secondary | ICD-10-CM | POA: Diagnosis not present

## 2023-04-01 DIAGNOSIS — D631 Anemia in chronic kidney disease: Secondary | ICD-10-CM | POA: Diagnosis not present

## 2023-04-01 DIAGNOSIS — N2581 Secondary hyperparathyroidism of renal origin: Secondary | ICD-10-CM | POA: Diagnosis not present

## 2023-04-01 DIAGNOSIS — Z992 Dependence on renal dialysis: Secondary | ICD-10-CM | POA: Diagnosis not present

## 2023-04-03 DIAGNOSIS — Z992 Dependence on renal dialysis: Secondary | ICD-10-CM | POA: Diagnosis not present

## 2023-04-03 DIAGNOSIS — N186 End stage renal disease: Secondary | ICD-10-CM | POA: Diagnosis not present

## 2023-04-03 DIAGNOSIS — I129 Hypertensive chronic kidney disease with stage 1 through stage 4 chronic kidney disease, or unspecified chronic kidney disease: Secondary | ICD-10-CM | POA: Diagnosis not present

## 2023-04-05 DIAGNOSIS — N2581 Secondary hyperparathyroidism of renal origin: Secondary | ICD-10-CM | POA: Diagnosis not present

## 2023-04-05 DIAGNOSIS — Z992 Dependence on renal dialysis: Secondary | ICD-10-CM | POA: Diagnosis not present

## 2023-04-05 DIAGNOSIS — D509 Iron deficiency anemia, unspecified: Secondary | ICD-10-CM | POA: Diagnosis not present

## 2023-04-05 DIAGNOSIS — N186 End stage renal disease: Secondary | ICD-10-CM | POA: Diagnosis not present

## 2023-04-05 DIAGNOSIS — E875 Hyperkalemia: Secondary | ICD-10-CM | POA: Diagnosis not present

## 2023-04-05 DIAGNOSIS — D631 Anemia in chronic kidney disease: Secondary | ICD-10-CM | POA: Diagnosis not present

## 2023-04-12 DIAGNOSIS — D631 Anemia in chronic kidney disease: Secondary | ICD-10-CM | POA: Diagnosis not present

## 2023-04-12 DIAGNOSIS — E875 Hyperkalemia: Secondary | ICD-10-CM | POA: Diagnosis not present

## 2023-04-12 DIAGNOSIS — N186 End stage renal disease: Secondary | ICD-10-CM | POA: Diagnosis not present

## 2023-04-12 DIAGNOSIS — N2581 Secondary hyperparathyroidism of renal origin: Secondary | ICD-10-CM | POA: Diagnosis not present

## 2023-04-12 DIAGNOSIS — D509 Iron deficiency anemia, unspecified: Secondary | ICD-10-CM | POA: Diagnosis not present

## 2023-04-12 DIAGNOSIS — Z992 Dependence on renal dialysis: Secondary | ICD-10-CM | POA: Diagnosis not present

## 2023-04-17 DIAGNOSIS — Z992 Dependence on renal dialysis: Secondary | ICD-10-CM | POA: Diagnosis not present

## 2023-04-17 DIAGNOSIS — D631 Anemia in chronic kidney disease: Secondary | ICD-10-CM | POA: Diagnosis not present

## 2023-04-17 DIAGNOSIS — D509 Iron deficiency anemia, unspecified: Secondary | ICD-10-CM | POA: Diagnosis not present

## 2023-04-17 DIAGNOSIS — E875 Hyperkalemia: Secondary | ICD-10-CM | POA: Diagnosis not present

## 2023-04-17 DIAGNOSIS — N2581 Secondary hyperparathyroidism of renal origin: Secondary | ICD-10-CM | POA: Diagnosis not present

## 2023-04-17 DIAGNOSIS — N186 End stage renal disease: Secondary | ICD-10-CM | POA: Diagnosis not present

## 2023-04-19 DIAGNOSIS — N186 End stage renal disease: Secondary | ICD-10-CM | POA: Diagnosis not present

## 2023-04-19 DIAGNOSIS — N2581 Secondary hyperparathyroidism of renal origin: Secondary | ICD-10-CM | POA: Diagnosis not present

## 2023-04-19 DIAGNOSIS — Z992 Dependence on renal dialysis: Secondary | ICD-10-CM | POA: Diagnosis not present

## 2023-04-19 DIAGNOSIS — D631 Anemia in chronic kidney disease: Secondary | ICD-10-CM | POA: Diagnosis not present

## 2023-04-19 DIAGNOSIS — E875 Hyperkalemia: Secondary | ICD-10-CM | POA: Diagnosis not present

## 2023-04-19 DIAGNOSIS — D509 Iron deficiency anemia, unspecified: Secondary | ICD-10-CM | POA: Diagnosis not present

## 2023-04-24 DIAGNOSIS — N2581 Secondary hyperparathyroidism of renal origin: Secondary | ICD-10-CM | POA: Diagnosis not present

## 2023-04-24 DIAGNOSIS — D509 Iron deficiency anemia, unspecified: Secondary | ICD-10-CM | POA: Diagnosis not present

## 2023-04-24 DIAGNOSIS — Z992 Dependence on renal dialysis: Secondary | ICD-10-CM | POA: Diagnosis not present

## 2023-04-24 DIAGNOSIS — N186 End stage renal disease: Secondary | ICD-10-CM | POA: Diagnosis not present

## 2023-04-24 DIAGNOSIS — D631 Anemia in chronic kidney disease: Secondary | ICD-10-CM | POA: Diagnosis not present

## 2023-04-24 DIAGNOSIS — E875 Hyperkalemia: Secondary | ICD-10-CM | POA: Diagnosis not present

## 2023-04-29 DIAGNOSIS — D631 Anemia in chronic kidney disease: Secondary | ICD-10-CM | POA: Diagnosis not present

## 2023-04-29 DIAGNOSIS — N186 End stage renal disease: Secondary | ICD-10-CM | POA: Diagnosis not present

## 2023-04-29 DIAGNOSIS — D509 Iron deficiency anemia, unspecified: Secondary | ICD-10-CM | POA: Diagnosis not present

## 2023-04-29 DIAGNOSIS — E875 Hyperkalemia: Secondary | ICD-10-CM | POA: Diagnosis not present

## 2023-04-29 DIAGNOSIS — N2581 Secondary hyperparathyroidism of renal origin: Secondary | ICD-10-CM | POA: Diagnosis not present

## 2023-04-29 DIAGNOSIS — Z992 Dependence on renal dialysis: Secondary | ICD-10-CM | POA: Diagnosis not present

## 2023-05-01 DIAGNOSIS — E875 Hyperkalemia: Secondary | ICD-10-CM | POA: Diagnosis not present

## 2023-05-01 DIAGNOSIS — N186 End stage renal disease: Secondary | ICD-10-CM | POA: Diagnosis not present

## 2023-05-01 DIAGNOSIS — Z992 Dependence on renal dialysis: Secondary | ICD-10-CM | POA: Diagnosis not present

## 2023-05-01 DIAGNOSIS — N2581 Secondary hyperparathyroidism of renal origin: Secondary | ICD-10-CM | POA: Diagnosis not present

## 2023-05-01 DIAGNOSIS — D631 Anemia in chronic kidney disease: Secondary | ICD-10-CM | POA: Diagnosis not present

## 2023-05-01 DIAGNOSIS — D509 Iron deficiency anemia, unspecified: Secondary | ICD-10-CM | POA: Diagnosis not present

## 2023-05-04 DIAGNOSIS — N186 End stage renal disease: Secondary | ICD-10-CM | POA: Diagnosis not present

## 2023-05-04 DIAGNOSIS — Z992 Dependence on renal dialysis: Secondary | ICD-10-CM | POA: Diagnosis not present

## 2023-05-04 DIAGNOSIS — I129 Hypertensive chronic kidney disease with stage 1 through stage 4 chronic kidney disease, or unspecified chronic kidney disease: Secondary | ICD-10-CM | POA: Diagnosis not present

## 2023-05-08 DIAGNOSIS — N2581 Secondary hyperparathyroidism of renal origin: Secondary | ICD-10-CM | POA: Diagnosis not present

## 2023-05-08 DIAGNOSIS — E875 Hyperkalemia: Secondary | ICD-10-CM | POA: Diagnosis not present

## 2023-05-08 DIAGNOSIS — D631 Anemia in chronic kidney disease: Secondary | ICD-10-CM | POA: Diagnosis not present

## 2023-05-08 DIAGNOSIS — N186 End stage renal disease: Secondary | ICD-10-CM | POA: Diagnosis not present

## 2023-05-08 DIAGNOSIS — Z992 Dependence on renal dialysis: Secondary | ICD-10-CM | POA: Diagnosis not present

## 2023-05-08 DIAGNOSIS — D509 Iron deficiency anemia, unspecified: Secondary | ICD-10-CM | POA: Diagnosis not present

## 2023-05-13 DIAGNOSIS — N2581 Secondary hyperparathyroidism of renal origin: Secondary | ICD-10-CM | POA: Diagnosis not present

## 2023-05-13 DIAGNOSIS — E875 Hyperkalemia: Secondary | ICD-10-CM | POA: Diagnosis not present

## 2023-05-13 DIAGNOSIS — D631 Anemia in chronic kidney disease: Secondary | ICD-10-CM | POA: Diagnosis not present

## 2023-05-13 DIAGNOSIS — D509 Iron deficiency anemia, unspecified: Secondary | ICD-10-CM | POA: Diagnosis not present

## 2023-05-13 DIAGNOSIS — Z992 Dependence on renal dialysis: Secondary | ICD-10-CM | POA: Diagnosis not present

## 2023-05-13 DIAGNOSIS — N186 End stage renal disease: Secondary | ICD-10-CM | POA: Diagnosis not present

## 2023-05-15 DIAGNOSIS — D631 Anemia in chronic kidney disease: Secondary | ICD-10-CM | POA: Diagnosis not present

## 2023-05-15 DIAGNOSIS — N2581 Secondary hyperparathyroidism of renal origin: Secondary | ICD-10-CM | POA: Diagnosis not present

## 2023-05-15 DIAGNOSIS — D509 Iron deficiency anemia, unspecified: Secondary | ICD-10-CM | POA: Diagnosis not present

## 2023-05-15 DIAGNOSIS — N186 End stage renal disease: Secondary | ICD-10-CM | POA: Diagnosis not present

## 2023-05-15 DIAGNOSIS — Z992 Dependence on renal dialysis: Secondary | ICD-10-CM | POA: Diagnosis not present

## 2023-05-15 DIAGNOSIS — E875 Hyperkalemia: Secondary | ICD-10-CM | POA: Diagnosis not present

## 2023-05-17 DIAGNOSIS — D631 Anemia in chronic kidney disease: Secondary | ICD-10-CM | POA: Diagnosis not present

## 2023-05-17 DIAGNOSIS — D509 Iron deficiency anemia, unspecified: Secondary | ICD-10-CM | POA: Diagnosis not present

## 2023-05-17 DIAGNOSIS — Z992 Dependence on renal dialysis: Secondary | ICD-10-CM | POA: Diagnosis not present

## 2023-05-17 DIAGNOSIS — N186 End stage renal disease: Secondary | ICD-10-CM | POA: Diagnosis not present

## 2023-05-17 DIAGNOSIS — N2581 Secondary hyperparathyroidism of renal origin: Secondary | ICD-10-CM | POA: Diagnosis not present

## 2023-05-17 DIAGNOSIS — E875 Hyperkalemia: Secondary | ICD-10-CM | POA: Diagnosis not present

## 2023-05-22 DIAGNOSIS — D509 Iron deficiency anemia, unspecified: Secondary | ICD-10-CM | POA: Diagnosis not present

## 2023-05-22 DIAGNOSIS — N186 End stage renal disease: Secondary | ICD-10-CM | POA: Diagnosis not present

## 2023-05-22 DIAGNOSIS — Z992 Dependence on renal dialysis: Secondary | ICD-10-CM | POA: Diagnosis not present

## 2023-05-22 DIAGNOSIS — N2581 Secondary hyperparathyroidism of renal origin: Secondary | ICD-10-CM | POA: Diagnosis not present

## 2023-05-22 DIAGNOSIS — D631 Anemia in chronic kidney disease: Secondary | ICD-10-CM | POA: Diagnosis not present

## 2023-05-22 DIAGNOSIS — E875 Hyperkalemia: Secondary | ICD-10-CM | POA: Diagnosis not present

## 2023-05-27 DIAGNOSIS — Z992 Dependence on renal dialysis: Secondary | ICD-10-CM | POA: Diagnosis not present

## 2023-05-27 DIAGNOSIS — N2581 Secondary hyperparathyroidism of renal origin: Secondary | ICD-10-CM | POA: Diagnosis not present

## 2023-05-27 DIAGNOSIS — N186 End stage renal disease: Secondary | ICD-10-CM | POA: Diagnosis not present

## 2023-05-27 DIAGNOSIS — D631 Anemia in chronic kidney disease: Secondary | ICD-10-CM | POA: Diagnosis not present

## 2023-05-27 DIAGNOSIS — E875 Hyperkalemia: Secondary | ICD-10-CM | POA: Diagnosis not present

## 2023-05-27 DIAGNOSIS — D509 Iron deficiency anemia, unspecified: Secondary | ICD-10-CM | POA: Diagnosis not present

## 2023-05-31 DIAGNOSIS — Z992 Dependence on renal dialysis: Secondary | ICD-10-CM | POA: Diagnosis not present

## 2023-05-31 DIAGNOSIS — D631 Anemia in chronic kidney disease: Secondary | ICD-10-CM | POA: Diagnosis not present

## 2023-05-31 DIAGNOSIS — N186 End stage renal disease: Secondary | ICD-10-CM | POA: Diagnosis not present

## 2023-05-31 DIAGNOSIS — N2581 Secondary hyperparathyroidism of renal origin: Secondary | ICD-10-CM | POA: Diagnosis not present

## 2023-05-31 DIAGNOSIS — E875 Hyperkalemia: Secondary | ICD-10-CM | POA: Diagnosis not present

## 2023-05-31 DIAGNOSIS — D509 Iron deficiency anemia, unspecified: Secondary | ICD-10-CM | POA: Diagnosis not present

## 2023-06-03 DIAGNOSIS — N186 End stage renal disease: Secondary | ICD-10-CM | POA: Diagnosis not present

## 2023-06-03 DIAGNOSIS — Z992 Dependence on renal dialysis: Secondary | ICD-10-CM | POA: Diagnosis not present

## 2023-06-03 DIAGNOSIS — I129 Hypertensive chronic kidney disease with stage 1 through stage 4 chronic kidney disease, or unspecified chronic kidney disease: Secondary | ICD-10-CM | POA: Diagnosis not present

## 2023-06-05 DIAGNOSIS — E875 Hyperkalemia: Secondary | ICD-10-CM | POA: Diagnosis not present

## 2023-06-05 DIAGNOSIS — N186 End stage renal disease: Secondary | ICD-10-CM | POA: Diagnosis not present

## 2023-06-05 DIAGNOSIS — D509 Iron deficiency anemia, unspecified: Secondary | ICD-10-CM | POA: Diagnosis not present

## 2023-06-05 DIAGNOSIS — D631 Anemia in chronic kidney disease: Secondary | ICD-10-CM | POA: Diagnosis not present

## 2023-06-05 DIAGNOSIS — N2581 Secondary hyperparathyroidism of renal origin: Secondary | ICD-10-CM | POA: Diagnosis not present

## 2023-06-05 DIAGNOSIS — Z992 Dependence on renal dialysis: Secondary | ICD-10-CM | POA: Diagnosis not present

## 2023-06-10 DIAGNOSIS — D509 Iron deficiency anemia, unspecified: Secondary | ICD-10-CM | POA: Diagnosis not present

## 2023-06-10 DIAGNOSIS — Z992 Dependence on renal dialysis: Secondary | ICD-10-CM | POA: Diagnosis not present

## 2023-06-10 DIAGNOSIS — E875 Hyperkalemia: Secondary | ICD-10-CM | POA: Diagnosis not present

## 2023-06-10 DIAGNOSIS — N2581 Secondary hyperparathyroidism of renal origin: Secondary | ICD-10-CM | POA: Diagnosis not present

## 2023-06-10 DIAGNOSIS — D631 Anemia in chronic kidney disease: Secondary | ICD-10-CM | POA: Diagnosis not present

## 2023-06-10 DIAGNOSIS — N186 End stage renal disease: Secondary | ICD-10-CM | POA: Diagnosis not present

## 2023-06-14 DIAGNOSIS — N2581 Secondary hyperparathyroidism of renal origin: Secondary | ICD-10-CM | POA: Diagnosis not present

## 2023-06-14 DIAGNOSIS — N186 End stage renal disease: Secondary | ICD-10-CM | POA: Diagnosis not present

## 2023-06-14 DIAGNOSIS — D509 Iron deficiency anemia, unspecified: Secondary | ICD-10-CM | POA: Diagnosis not present

## 2023-06-14 DIAGNOSIS — E875 Hyperkalemia: Secondary | ICD-10-CM | POA: Diagnosis not present

## 2023-06-14 DIAGNOSIS — Z992 Dependence on renal dialysis: Secondary | ICD-10-CM | POA: Diagnosis not present

## 2023-06-14 DIAGNOSIS — D631 Anemia in chronic kidney disease: Secondary | ICD-10-CM | POA: Diagnosis not present

## 2023-06-19 ENCOUNTER — Other Ambulatory Visit: Payer: Self-pay

## 2023-06-19 DIAGNOSIS — Z992 Dependence on renal dialysis: Secondary | ICD-10-CM | POA: Diagnosis not present

## 2023-06-19 DIAGNOSIS — N186 End stage renal disease: Secondary | ICD-10-CM | POA: Diagnosis not present

## 2023-06-19 DIAGNOSIS — D631 Anemia in chronic kidney disease: Secondary | ICD-10-CM | POA: Diagnosis not present

## 2023-06-19 DIAGNOSIS — D509 Iron deficiency anemia, unspecified: Secondary | ICD-10-CM | POA: Diagnosis not present

## 2023-06-19 DIAGNOSIS — E875 Hyperkalemia: Secondary | ICD-10-CM | POA: Diagnosis not present

## 2023-06-19 DIAGNOSIS — N2581 Secondary hyperparathyroidism of renal origin: Secondary | ICD-10-CM | POA: Diagnosis not present

## 2023-06-19 MED ORDER — AMLODIPINE BESYLATE 10 MG PO TABS
10.0000 mg | ORAL_TABLET | Freq: Every day | ORAL | 3 refills | Status: DC
  Filled 2023-06-19: qty 90, 90d supply, fill #0
  Filled 2024-02-12: qty 90, 90d supply, fill #1

## 2023-06-19 MED ORDER — CARVEDILOL 25 MG PO TABS
25.0000 mg | ORAL_TABLET | Freq: Two times a day (BID) | ORAL | 3 refills | Status: DC
  Filled 2023-06-19: qty 180, 90d supply, fill #0
  Filled 2024-02-12: qty 180, 90d supply, fill #1

## 2023-06-19 MED ORDER — HYDRALAZINE HCL 25 MG PO TABS
25.0000 mg | ORAL_TABLET | Freq: Two times a day (BID) | ORAL | 3 refills | Status: DC
  Filled 2023-06-19: qty 180, 90d supply, fill #0
  Filled 2024-02-12: qty 180, 90d supply, fill #1

## 2023-06-24 DIAGNOSIS — D631 Anemia in chronic kidney disease: Secondary | ICD-10-CM | POA: Diagnosis not present

## 2023-06-24 DIAGNOSIS — N2581 Secondary hyperparathyroidism of renal origin: Secondary | ICD-10-CM | POA: Diagnosis not present

## 2023-06-24 DIAGNOSIS — N186 End stage renal disease: Secondary | ICD-10-CM | POA: Diagnosis not present

## 2023-06-24 DIAGNOSIS — E875 Hyperkalemia: Secondary | ICD-10-CM | POA: Diagnosis not present

## 2023-06-24 DIAGNOSIS — Z992 Dependence on renal dialysis: Secondary | ICD-10-CM | POA: Diagnosis not present

## 2023-06-24 DIAGNOSIS — D509 Iron deficiency anemia, unspecified: Secondary | ICD-10-CM | POA: Diagnosis not present

## 2023-06-26 DIAGNOSIS — N186 End stage renal disease: Secondary | ICD-10-CM | POA: Diagnosis not present

## 2023-06-26 DIAGNOSIS — D631 Anemia in chronic kidney disease: Secondary | ICD-10-CM | POA: Diagnosis not present

## 2023-06-26 DIAGNOSIS — N2581 Secondary hyperparathyroidism of renal origin: Secondary | ICD-10-CM | POA: Diagnosis not present

## 2023-06-26 DIAGNOSIS — D509 Iron deficiency anemia, unspecified: Secondary | ICD-10-CM | POA: Diagnosis not present

## 2023-06-26 DIAGNOSIS — Z992 Dependence on renal dialysis: Secondary | ICD-10-CM | POA: Diagnosis not present

## 2023-06-26 DIAGNOSIS — E875 Hyperkalemia: Secondary | ICD-10-CM | POA: Diagnosis not present

## 2023-06-30 ENCOUNTER — Other Ambulatory Visit: Payer: Self-pay

## 2023-07-01 DIAGNOSIS — E875 Hyperkalemia: Secondary | ICD-10-CM | POA: Diagnosis not present

## 2023-07-01 DIAGNOSIS — D509 Iron deficiency anemia, unspecified: Secondary | ICD-10-CM | POA: Diagnosis not present

## 2023-07-01 DIAGNOSIS — N186 End stage renal disease: Secondary | ICD-10-CM | POA: Diagnosis not present

## 2023-07-01 DIAGNOSIS — N2581 Secondary hyperparathyroidism of renal origin: Secondary | ICD-10-CM | POA: Diagnosis not present

## 2023-07-01 DIAGNOSIS — Z992 Dependence on renal dialysis: Secondary | ICD-10-CM | POA: Diagnosis not present

## 2023-07-01 DIAGNOSIS — D631 Anemia in chronic kidney disease: Secondary | ICD-10-CM | POA: Diagnosis not present

## 2023-07-04 DIAGNOSIS — N186 End stage renal disease: Secondary | ICD-10-CM | POA: Diagnosis not present

## 2023-07-04 DIAGNOSIS — I129 Hypertensive chronic kidney disease with stage 1 through stage 4 chronic kidney disease, or unspecified chronic kidney disease: Secondary | ICD-10-CM | POA: Diagnosis not present

## 2023-07-04 DIAGNOSIS — Z992 Dependence on renal dialysis: Secondary | ICD-10-CM | POA: Diagnosis not present

## 2023-07-08 DIAGNOSIS — D509 Iron deficiency anemia, unspecified: Secondary | ICD-10-CM | POA: Diagnosis not present

## 2023-07-08 DIAGNOSIS — N2581 Secondary hyperparathyroidism of renal origin: Secondary | ICD-10-CM | POA: Diagnosis not present

## 2023-07-08 DIAGNOSIS — D631 Anemia in chronic kidney disease: Secondary | ICD-10-CM | POA: Diagnosis not present

## 2023-07-08 DIAGNOSIS — N186 End stage renal disease: Secondary | ICD-10-CM | POA: Diagnosis not present

## 2023-07-08 DIAGNOSIS — Z992 Dependence on renal dialysis: Secondary | ICD-10-CM | POA: Diagnosis not present

## 2023-07-08 DIAGNOSIS — E875 Hyperkalemia: Secondary | ICD-10-CM | POA: Diagnosis not present

## 2023-07-12 DIAGNOSIS — E875 Hyperkalemia: Secondary | ICD-10-CM | POA: Diagnosis not present

## 2023-07-12 DIAGNOSIS — D509 Iron deficiency anemia, unspecified: Secondary | ICD-10-CM | POA: Diagnosis not present

## 2023-07-12 DIAGNOSIS — D631 Anemia in chronic kidney disease: Secondary | ICD-10-CM | POA: Diagnosis not present

## 2023-07-12 DIAGNOSIS — N2581 Secondary hyperparathyroidism of renal origin: Secondary | ICD-10-CM | POA: Diagnosis not present

## 2023-07-12 DIAGNOSIS — N186 End stage renal disease: Secondary | ICD-10-CM | POA: Diagnosis not present

## 2023-07-12 DIAGNOSIS — Z992 Dependence on renal dialysis: Secondary | ICD-10-CM | POA: Diagnosis not present

## 2023-07-17 DIAGNOSIS — Z992 Dependence on renal dialysis: Secondary | ICD-10-CM | POA: Diagnosis not present

## 2023-07-17 DIAGNOSIS — E875 Hyperkalemia: Secondary | ICD-10-CM | POA: Diagnosis not present

## 2023-07-17 DIAGNOSIS — N186 End stage renal disease: Secondary | ICD-10-CM | POA: Diagnosis not present

## 2023-07-17 DIAGNOSIS — N2581 Secondary hyperparathyroidism of renal origin: Secondary | ICD-10-CM | POA: Diagnosis not present

## 2023-07-17 DIAGNOSIS — D631 Anemia in chronic kidney disease: Secondary | ICD-10-CM | POA: Diagnosis not present

## 2023-07-17 DIAGNOSIS — D509 Iron deficiency anemia, unspecified: Secondary | ICD-10-CM | POA: Diagnosis not present

## 2023-07-19 DIAGNOSIS — D631 Anemia in chronic kidney disease: Secondary | ICD-10-CM | POA: Diagnosis not present

## 2023-07-19 DIAGNOSIS — Z992 Dependence on renal dialysis: Secondary | ICD-10-CM | POA: Diagnosis not present

## 2023-07-19 DIAGNOSIS — E875 Hyperkalemia: Secondary | ICD-10-CM | POA: Diagnosis not present

## 2023-07-19 DIAGNOSIS — N186 End stage renal disease: Secondary | ICD-10-CM | POA: Diagnosis not present

## 2023-07-19 DIAGNOSIS — N2581 Secondary hyperparathyroidism of renal origin: Secondary | ICD-10-CM | POA: Diagnosis not present

## 2023-07-19 DIAGNOSIS — D509 Iron deficiency anemia, unspecified: Secondary | ICD-10-CM | POA: Diagnosis not present

## 2023-07-24 DIAGNOSIS — Z992 Dependence on renal dialysis: Secondary | ICD-10-CM | POA: Diagnosis not present

## 2023-07-24 DIAGNOSIS — D631 Anemia in chronic kidney disease: Secondary | ICD-10-CM | POA: Diagnosis not present

## 2023-07-24 DIAGNOSIS — D509 Iron deficiency anemia, unspecified: Secondary | ICD-10-CM | POA: Diagnosis not present

## 2023-07-24 DIAGNOSIS — N186 End stage renal disease: Secondary | ICD-10-CM | POA: Diagnosis not present

## 2023-07-24 DIAGNOSIS — E875 Hyperkalemia: Secondary | ICD-10-CM | POA: Diagnosis not present

## 2023-07-24 DIAGNOSIS — N2581 Secondary hyperparathyroidism of renal origin: Secondary | ICD-10-CM | POA: Diagnosis not present

## 2023-07-26 DIAGNOSIS — E875 Hyperkalemia: Secondary | ICD-10-CM | POA: Diagnosis not present

## 2023-07-26 DIAGNOSIS — N186 End stage renal disease: Secondary | ICD-10-CM | POA: Diagnosis not present

## 2023-07-26 DIAGNOSIS — Z992 Dependence on renal dialysis: Secondary | ICD-10-CM | POA: Diagnosis not present

## 2023-07-26 DIAGNOSIS — N2581 Secondary hyperparathyroidism of renal origin: Secondary | ICD-10-CM | POA: Diagnosis not present

## 2023-07-26 DIAGNOSIS — D631 Anemia in chronic kidney disease: Secondary | ICD-10-CM | POA: Diagnosis not present

## 2023-07-26 DIAGNOSIS — D509 Iron deficiency anemia, unspecified: Secondary | ICD-10-CM | POA: Diagnosis not present

## 2023-08-02 DIAGNOSIS — D631 Anemia in chronic kidney disease: Secondary | ICD-10-CM | POA: Diagnosis not present

## 2023-08-02 DIAGNOSIS — E875 Hyperkalemia: Secondary | ICD-10-CM | POA: Diagnosis not present

## 2023-08-02 DIAGNOSIS — N2581 Secondary hyperparathyroidism of renal origin: Secondary | ICD-10-CM | POA: Diagnosis not present

## 2023-08-02 DIAGNOSIS — Z992 Dependence on renal dialysis: Secondary | ICD-10-CM | POA: Diagnosis not present

## 2023-08-02 DIAGNOSIS — D509 Iron deficiency anemia, unspecified: Secondary | ICD-10-CM | POA: Diagnosis not present

## 2023-08-02 DIAGNOSIS — N186 End stage renal disease: Secondary | ICD-10-CM | POA: Diagnosis not present

## 2023-08-03 DIAGNOSIS — I129 Hypertensive chronic kidney disease with stage 1 through stage 4 chronic kidney disease, or unspecified chronic kidney disease: Secondary | ICD-10-CM | POA: Diagnosis not present

## 2023-08-03 DIAGNOSIS — N186 End stage renal disease: Secondary | ICD-10-CM | POA: Diagnosis not present

## 2023-08-03 DIAGNOSIS — Z992 Dependence on renal dialysis: Secondary | ICD-10-CM | POA: Diagnosis not present

## 2023-08-05 DIAGNOSIS — N2581 Secondary hyperparathyroidism of renal origin: Secondary | ICD-10-CM | POA: Diagnosis not present

## 2023-08-05 DIAGNOSIS — N186 End stage renal disease: Secondary | ICD-10-CM | POA: Diagnosis not present

## 2023-08-05 DIAGNOSIS — E875 Hyperkalemia: Secondary | ICD-10-CM | POA: Diagnosis not present

## 2023-08-05 DIAGNOSIS — Z992 Dependence on renal dialysis: Secondary | ICD-10-CM | POA: Diagnosis not present

## 2023-08-05 DIAGNOSIS — D509 Iron deficiency anemia, unspecified: Secondary | ICD-10-CM | POA: Diagnosis not present

## 2023-08-09 DIAGNOSIS — D509 Iron deficiency anemia, unspecified: Secondary | ICD-10-CM | POA: Diagnosis not present

## 2023-08-09 DIAGNOSIS — N186 End stage renal disease: Secondary | ICD-10-CM | POA: Diagnosis not present

## 2023-08-09 DIAGNOSIS — N2581 Secondary hyperparathyroidism of renal origin: Secondary | ICD-10-CM | POA: Diagnosis not present

## 2023-08-09 DIAGNOSIS — Z992 Dependence on renal dialysis: Secondary | ICD-10-CM | POA: Diagnosis not present

## 2023-08-09 DIAGNOSIS — E875 Hyperkalemia: Secondary | ICD-10-CM | POA: Diagnosis not present

## 2023-08-14 DIAGNOSIS — N186 End stage renal disease: Secondary | ICD-10-CM | POA: Diagnosis not present

## 2023-08-14 DIAGNOSIS — E875 Hyperkalemia: Secondary | ICD-10-CM | POA: Diagnosis not present

## 2023-08-14 DIAGNOSIS — N2581 Secondary hyperparathyroidism of renal origin: Secondary | ICD-10-CM | POA: Diagnosis not present

## 2023-08-14 DIAGNOSIS — Z992 Dependence on renal dialysis: Secondary | ICD-10-CM | POA: Diagnosis not present

## 2023-08-14 DIAGNOSIS — D509 Iron deficiency anemia, unspecified: Secondary | ICD-10-CM | POA: Diagnosis not present

## 2023-08-19 DIAGNOSIS — D509 Iron deficiency anemia, unspecified: Secondary | ICD-10-CM | POA: Diagnosis not present

## 2023-08-19 DIAGNOSIS — N2581 Secondary hyperparathyroidism of renal origin: Secondary | ICD-10-CM | POA: Diagnosis not present

## 2023-08-19 DIAGNOSIS — N186 End stage renal disease: Secondary | ICD-10-CM | POA: Diagnosis not present

## 2023-08-19 DIAGNOSIS — E875 Hyperkalemia: Secondary | ICD-10-CM | POA: Diagnosis not present

## 2023-08-19 DIAGNOSIS — Z992 Dependence on renal dialysis: Secondary | ICD-10-CM | POA: Diagnosis not present

## 2023-08-23 DIAGNOSIS — Z992 Dependence on renal dialysis: Secondary | ICD-10-CM | POA: Diagnosis not present

## 2023-08-23 DIAGNOSIS — N2581 Secondary hyperparathyroidism of renal origin: Secondary | ICD-10-CM | POA: Diagnosis not present

## 2023-08-23 DIAGNOSIS — D509 Iron deficiency anemia, unspecified: Secondary | ICD-10-CM | POA: Diagnosis not present

## 2023-08-23 DIAGNOSIS — E875 Hyperkalemia: Secondary | ICD-10-CM | POA: Diagnosis not present

## 2023-08-23 DIAGNOSIS — N186 End stage renal disease: Secondary | ICD-10-CM | POA: Diagnosis not present

## 2023-08-25 DIAGNOSIS — Z992 Dependence on renal dialysis: Secondary | ICD-10-CM | POA: Diagnosis not present

## 2023-08-25 DIAGNOSIS — D509 Iron deficiency anemia, unspecified: Secondary | ICD-10-CM | POA: Diagnosis not present

## 2023-08-25 DIAGNOSIS — N2581 Secondary hyperparathyroidism of renal origin: Secondary | ICD-10-CM | POA: Diagnosis not present

## 2023-08-25 DIAGNOSIS — N186 End stage renal disease: Secondary | ICD-10-CM | POA: Diagnosis not present

## 2023-08-25 DIAGNOSIS — E875 Hyperkalemia: Secondary | ICD-10-CM | POA: Diagnosis not present

## 2023-08-28 DIAGNOSIS — E875 Hyperkalemia: Secondary | ICD-10-CM | POA: Diagnosis not present

## 2023-08-28 DIAGNOSIS — Z992 Dependence on renal dialysis: Secondary | ICD-10-CM | POA: Diagnosis not present

## 2023-08-28 DIAGNOSIS — N186 End stage renal disease: Secondary | ICD-10-CM | POA: Diagnosis not present

## 2023-08-28 DIAGNOSIS — N2581 Secondary hyperparathyroidism of renal origin: Secondary | ICD-10-CM | POA: Diagnosis not present

## 2023-08-28 DIAGNOSIS — D509 Iron deficiency anemia, unspecified: Secondary | ICD-10-CM | POA: Diagnosis not present

## 2023-09-01 DIAGNOSIS — N2581 Secondary hyperparathyroidism of renal origin: Secondary | ICD-10-CM | POA: Diagnosis not present

## 2023-09-01 DIAGNOSIS — D509 Iron deficiency anemia, unspecified: Secondary | ICD-10-CM | POA: Diagnosis not present

## 2023-09-01 DIAGNOSIS — Z992 Dependence on renal dialysis: Secondary | ICD-10-CM | POA: Diagnosis not present

## 2023-09-01 DIAGNOSIS — N186 End stage renal disease: Secondary | ICD-10-CM | POA: Diagnosis not present

## 2023-09-01 DIAGNOSIS — E875 Hyperkalemia: Secondary | ICD-10-CM | POA: Diagnosis not present

## 2023-11-19 ENCOUNTER — Ambulatory Visit: Payer: Self-pay | Admitting: Internal Medicine

## 2023-11-19 NOTE — Telephone Encounter (Signed)
 Copied from CRM [id number]. Topic: Clinical - Red Word Triage >> Nov 19, 2023  4:18 PM Fredrica W wrote: Red Word that prompted transfer to Nurse Triage: Swollen testicles happened twice can hardly walk  Chief Complaint: base of scrotum swollen Symptoms: pain 4/10 Frequency: 2 weeks comes and goes Pertinent Negatives: Patient denies abd pain, urination pain, difficulty passing urine Disposition: [x] ED /[] Urgent Care (no appt availability in office) / [] Appointment(In office/virtual)/ []  [place] [facility]/ [] Home Care/ [] Refused Recommended Disposition /[] [facility]/ []  Follow-up with PCP Additional Notes:   Reason for Disposition  Scrotum is painful or tender to touch  Answer Assessment - Initial Assessment Questions 1. SCROTAL SWELLING: "What does the scrotum look like?" "How swollen is it?" (mild, moderate severe; compare to other side)     Bottom of scrotum 2. LOCATION: "Where is the swelling located?"     Bottom of scrotum  3. ONSET: "When did the swelling start?"     2 weeks ago  4. PATTERN: "Does it come and go, or has it been constant since it started?"     Comes and goes 5. SCROTAL PAIN: "Is there any pain?" If Yes, ask: "How bad is it?"  (Scale 1-10; or mild, moderate, severe)     4/10 6. HERNIA: "Has a doctor ever told you that you have a hernia?"     no 7. OTHER SYMPTOMS: "Do you have any other symptoms?" (e.g., abdomen pain, difficulty passing urine, fever, vomiting)     no  Protocols used: Scrotum Swelling-A-AH

## 2023-11-19 NOTE — Telephone Encounter (Signed)
 Noted.

## 2023-11-21 ENCOUNTER — Emergency Department (HOSPITAL_BASED_OUTPATIENT_CLINIC_OR_DEPARTMENT_OTHER)

## 2023-11-21 ENCOUNTER — Encounter (HOSPITAL_BASED_OUTPATIENT_CLINIC_OR_DEPARTMENT_OTHER): Payer: Self-pay

## 2023-11-21 ENCOUNTER — Other Ambulatory Visit: Payer: Self-pay

## 2023-11-21 ENCOUNTER — Inpatient Hospital Stay (HOSPITAL_BASED_OUTPATIENT_CLINIC_OR_DEPARTMENT_OTHER)
Admission: EM | Admit: 2023-11-21 | Discharge: 2023-11-27 | DRG: 717 | Disposition: A | Discharge status: Home Health | Attending: Internal Medicine | Admitting: Internal Medicine

## 2023-11-21 DIAGNOSIS — Z95828 Presence of other vascular implants and grafts: Secondary | ICD-10-CM

## 2023-11-21 DIAGNOSIS — R188 Other ascites: Secondary | ICD-10-CM | POA: Diagnosis present

## 2023-11-21 DIAGNOSIS — N493 Fournier gangrene: Principal | ICD-10-CM | POA: Diagnosis present

## 2023-11-21 DIAGNOSIS — N431 Infected hydrocele: Secondary | ICD-10-CM | POA: Diagnosis present

## 2023-11-21 DIAGNOSIS — D631 Anemia in chronic kidney disease: Secondary | ICD-10-CM | POA: Diagnosis present

## 2023-11-21 DIAGNOSIS — N2581 Secondary hyperparathyroidism of renal origin: Secondary | ICD-10-CM | POA: Diagnosis present

## 2023-11-21 DIAGNOSIS — E669 Obesity, unspecified: Secondary | ICD-10-CM | POA: Diagnosis present

## 2023-11-21 DIAGNOSIS — R011 Cardiac murmur, unspecified: Secondary | ICD-10-CM | POA: Diagnosis not present

## 2023-11-21 DIAGNOSIS — I120 Hypertensive chronic kidney disease with stage 5 chronic kidney disease or end stage renal disease: Secondary | ICD-10-CM | POA: Diagnosis present

## 2023-11-21 DIAGNOSIS — Z8719 Personal history of other diseases of the digestive system: Secondary | ICD-10-CM | POA: Diagnosis not present

## 2023-11-21 DIAGNOSIS — E875 Hyperkalemia: Secondary | ICD-10-CM | POA: Diagnosis not present

## 2023-11-21 DIAGNOSIS — B9689 Other specified bacterial agents as the cause of diseases classified elsewhere: Secondary | ICD-10-CM | POA: Diagnosis present

## 2023-11-21 DIAGNOSIS — Z79899 Other long term (current) drug therapy: Secondary | ICD-10-CM

## 2023-11-21 DIAGNOSIS — E1122 Type 2 diabetes mellitus with diabetic chronic kidney disease: Secondary | ICD-10-CM | POA: Diagnosis present

## 2023-11-21 DIAGNOSIS — I708 Atherosclerosis of other arteries: Secondary | ICD-10-CM | POA: Diagnosis present

## 2023-11-21 DIAGNOSIS — E8770 Fluid overload, unspecified: Secondary | ICD-10-CM | POA: Diagnosis present

## 2023-11-21 DIAGNOSIS — D62 Acute posthemorrhagic anemia: Secondary | ICD-10-CM | POA: Diagnosis not present

## 2023-11-21 DIAGNOSIS — Z9089 Acquired absence of other organs: Secondary | ICD-10-CM

## 2023-11-21 DIAGNOSIS — E1165 Type 2 diabetes mellitus with hyperglycemia: Secondary | ICD-10-CM | POA: Diagnosis present

## 2023-11-21 DIAGNOSIS — E1152 Type 2 diabetes mellitus with diabetic peripheral angiopathy with gangrene: Secondary | ICD-10-CM | POA: Diagnosis present

## 2023-11-21 DIAGNOSIS — Z6831 Body mass index (BMI) 31.0-31.9, adult: Secondary | ICD-10-CM

## 2023-11-21 DIAGNOSIS — Z794 Long term (current) use of insulin: Secondary | ICD-10-CM | POA: Diagnosis not present

## 2023-11-21 DIAGNOSIS — N492 Inflammatory disorders of scrotum: Principal | ICD-10-CM | POA: Diagnosis present

## 2023-11-21 DIAGNOSIS — Z833 Family history of diabetes mellitus: Secondary | ICD-10-CM | POA: Diagnosis not present

## 2023-11-21 DIAGNOSIS — I10 Essential (primary) hypertension: Secondary | ICD-10-CM | POA: Diagnosis present

## 2023-11-21 DIAGNOSIS — Z8249 Family history of ischemic heart disease and other diseases of the circulatory system: Secondary | ICD-10-CM | POA: Diagnosis not present

## 2023-11-21 DIAGNOSIS — E119 Type 2 diabetes mellitus without complications: Secondary | ICD-10-CM

## 2023-11-21 DIAGNOSIS — L03314 Cellulitis of groin: Secondary | ICD-10-CM | POA: Diagnosis present

## 2023-11-21 DIAGNOSIS — D72829 Elevated white blood cell count, unspecified: Secondary | ICD-10-CM

## 2023-11-21 DIAGNOSIS — Z992 Dependence on renal dialysis: Secondary | ICD-10-CM | POA: Diagnosis not present

## 2023-11-21 DIAGNOSIS — N186 End stage renal disease: Secondary | ICD-10-CM | POA: Diagnosis present

## 2023-11-21 DIAGNOSIS — I700 Atherosclerosis of aorta: Secondary | ICD-10-CM | POA: Diagnosis present

## 2023-11-21 DIAGNOSIS — N189 Chronic kidney disease, unspecified: Secondary | ICD-10-CM | POA: Diagnosis present

## 2023-11-21 DIAGNOSIS — Z9889 Other specified postprocedural states: Secondary | ICD-10-CM

## 2023-11-21 HISTORY — DX: Dependence on renal dialysis: N18.6

## 2023-11-21 LAB — CBC WITH DIFFERENTIAL/PLATELET
Abs Immature Granulocytes: 0.08 10*3/uL — ABNORMAL HIGH (ref 0.00–0.07)
Basophils Absolute: 0 10*3/uL (ref 0.0–0.1)
Basophils Relative: 0 %
Eosinophils Absolute: 0 10*3/uL (ref 0.0–0.5)
Eosinophils Relative: 0 %
HCT: 26.1 % — ABNORMAL LOW (ref 39.0–52.0)
Hemoglobin: 8.4 g/dL — ABNORMAL LOW (ref 13.0–17.0)
Immature Granulocytes: 1 %
Lymphocytes Relative: 9 %
Lymphs Abs: 1.1 10*3/uL (ref 0.7–4.0)
MCH: 29.4 pg (ref 26.0–34.0)
MCHC: 32.2 g/dL (ref 30.0–36.0)
MCV: 91.3 fL (ref 80.0–100.0)
Monocytes Absolute: 1 10*3/uL (ref 0.1–1.0)
Monocytes Relative: 8 %
Neutro Abs: 9.8 10*3/uL — ABNORMAL HIGH (ref 1.7–7.7)
Neutrophils Relative %: 82 %
Platelets: 159 10*3/uL (ref 150–400)
RBC: 2.86 MIL/uL — ABNORMAL LOW (ref 4.22–5.81)
RDW: 17.9 % — ABNORMAL HIGH (ref 11.5–15.5)
WBC: 11.9 10*3/uL — ABNORMAL HIGH (ref 4.0–10.5)
nRBC: 0 % (ref 0.0–0.2)

## 2023-11-21 LAB — COMPREHENSIVE METABOLIC PANEL
ALT: 9 U/L (ref 0–44)
AST: 14 U/L — ABNORMAL LOW (ref 15–41)
Albumin: 3.8 g/dL (ref 3.5–5.0)
Alkaline Phosphatase: 63 U/L (ref 38–126)
Anion gap: 13 (ref 5–15)
BUN: 42 mg/dL — ABNORMAL HIGH (ref 6–20)
CO2: 28 mmol/L (ref 22–32)
Calcium: 8.9 mg/dL (ref 8.9–10.3)
Chloride: 94 mmol/L — ABNORMAL LOW (ref 98–111)
Creatinine, Ser: 10.9 mg/dL — ABNORMAL HIGH (ref 0.61–1.24)
GFR, Estimated: 6 mL/min — ABNORMAL LOW (ref >60)
Glucose, Bld: 121 mg/dL — ABNORMAL HIGH (ref 70–99)
Potassium: 4.4 mmol/L (ref 3.5–5.1)
Sodium: 135 mmol/L (ref 135–145)
Total Bilirubin: 1.1 mg/dL (ref 0.0–1.2)
Total Protein: 7.7 g/dL (ref 6.5–8.1)

## 2023-11-21 LAB — MRSA NEXT GEN BY PCR, NASAL: MRSA by PCR Next Gen: NOT DETECTED

## 2023-11-21 LAB — HEMOGLOBIN A1C
Hgb A1c MFr Bld: 5.3 % (ref 4.8–5.6)
Mean Plasma Glucose: 105.41 mg/dL

## 2023-11-21 LAB — LACTIC ACID, PLASMA
Lactic Acid, Venous: 1 mmol/L (ref 0.5–1.9)
Lactic Acid, Venous: 0.8 mmol/L (ref 0.5–1.9)

## 2023-11-21 LAB — GLUCOSE, CAPILLARY
Glucose-Capillary: 82 mg/dL (ref 70–99)
Glucose-Capillary: 98 mg/dL (ref 70–99)

## 2023-11-21 LAB — HIV ANTIBODY (ROUTINE TESTING W REFLEX): HIV Screen 4th Generation wRfx: NONREACTIVE

## 2023-11-21 LAB — HEPATITIS B SURFACE ANTIGEN: Hepatitis B Surface Ag: NONREACTIVE

## 2023-11-21 MED ORDER — FENTANYL CITRATE PF 50 MCG/ML IJ SOSY
50.0000 ug | PREFILLED_SYRINGE | Freq: Once | INTRAMUSCULAR | Status: AC
  Administered 2023-11-21: 50 ug via INTRAVENOUS
  Filled 2023-11-21: qty 1

## 2023-11-21 MED ORDER — ONDANSETRON HCL 4 MG/2ML IJ SOLN: 4.0000 mg | Freq: Four times a day (QID) | INTRAMUSCULAR | Status: DC | PRN

## 2023-11-21 MED ORDER — PIPERACILLIN-TAZOBACTAM 3.375 G IVPB 30 MIN
3.3750 g | Freq: Once | INTRAVENOUS | Status: AC
  Administered 2023-11-21: 3.375 g via INTRAVENOUS
  Filled 2023-11-21: qty 50

## 2023-11-21 MED ORDER — ONDANSETRON HCL 4 MG PO TABS: 4.0000 mg | ORAL_TABLET | Freq: Four times a day (QID) | ORAL | Status: DC | PRN

## 2023-11-21 MED ORDER — PRAVASTATIN SODIUM 10 MG PO TABS
20.0000 mg | ORAL_TABLET | Freq: Every day | ORAL | Status: DC
  Administered 2023-11-21 – 2023-11-26 (×6): 20 mg via ORAL
  Filled 2023-11-21 (×6): qty 2

## 2023-11-21 MED ORDER — VANCOMYCIN HCL IN DEXTROSE 1-5 GM/200ML-% IV SOLN
1000.0000 mg | Freq: Once | INTRAVENOUS | Status: AC
  Administered 2023-11-21: 1000 mg via INTRAVENOUS
  Filled 2023-11-21: qty 200

## 2023-11-21 MED ORDER — PIPERACILLIN-TAZOBACTAM 3.375 G IVPB: 3.3750 g | Freq: Three times a day (TID) | INTRAVENOUS | Status: DC

## 2023-11-21 MED ORDER — CLINDAMYCIN PHOSPHATE 900 MG/50ML IV SOLN
900.0000 mg | Freq: Once | INTRAVENOUS | Status: AC
  Administered 2023-11-21: 900 mg via INTRAVENOUS
  Filled 2023-11-21: qty 50

## 2023-11-21 MED ORDER — HEPARIN SODIUM (PORCINE) 5000 UNIT/ML IJ SOLN: 5000.0000 [IU] | Freq: Three times a day (TID) | INTRAMUSCULAR | Status: DC

## 2023-11-21 MED ORDER — HYDRALAZINE HCL 20 MG/ML IJ SOLN: 5.0000 mg | INTRAMUSCULAR | Status: DC | PRN

## 2023-11-21 MED ORDER — CHLORHEXIDINE GLUCONATE CLOTH 2 % EX PADS
6.0000 | MEDICATED_PAD | Freq: Every day | CUTANEOUS | Status: DC
  Administered 2023-11-22 – 2023-11-26 (×4): 6 via TOPICAL

## 2023-11-21 MED ORDER — SODIUM CHLORIDE 0.9% FLUSH: 3.0000 mL | INTRAVENOUS | Status: DC | PRN

## 2023-11-21 MED ORDER — CLINDAMYCIN PHOSPHATE 600 MG/50ML IV SOLN
600.0000 mg | Freq: Three times a day (TID) | INTRAVENOUS | Status: DC
  Administered 2023-11-21 – 2023-11-25 (×11): 600 mg via INTRAVENOUS
  Filled 2023-11-21 (×15): qty 50

## 2023-11-21 MED ORDER — SODIUM CHLORIDE 0.9% FLUSH
3.0000 mL | Freq: Two times a day (BID) | INTRAVENOUS | Status: DC
  Administered 2023-11-21 – 2023-11-22 (×4): 3 mL via INTRAVENOUS
  Administered 2023-11-23 – 2023-11-26 (×7): 10 mL via INTRAVENOUS

## 2023-11-21 MED ORDER — CARVEDILOL 3.125 MG PO TABS: 3.1250 mg | ORAL_TABLET | Freq: Two times a day (BID) | ORAL | Status: DC

## 2023-11-21 MED ORDER — CARVEDILOL 25 MG PO TABS
25.0000 mg | ORAL_TABLET | Freq: Two times a day (BID) | ORAL | Status: DC
  Administered 2023-11-21 – 2023-11-26 (×10): 25 mg via ORAL
  Filled 2023-11-21 (×10): qty 1

## 2023-11-21 MED ORDER — NICOTINE 14 MG/24HR TD PT24: 14.0000 mg | MEDICATED_PATCH | Freq: Every day | TRANSDERMAL | Status: DC | PRN

## 2023-11-21 MED ORDER — MORPHINE SULFATE (PF) 2 MG/ML IV SOLN
2.0000 mg | INTRAVENOUS | Status: DC | PRN
  Administered 2023-11-21 – 2023-11-26 (×12): 2 mg via INTRAVENOUS
  Filled 2023-11-21 (×12): qty 1

## 2023-11-21 MED ORDER — LIDOCAINE HCL 1 % IJ SOLN
20.0000 mL | Freq: Once | INTRAMUSCULAR | Status: AC
  Administered 2023-11-21: 20 mL via INTRADERMAL
  Filled 2023-11-21: qty 20

## 2023-11-21 MED ORDER — ACETAMINOPHEN 650 MG RE SUPP: 650.0000 mg | Freq: Four times a day (QID) | RECTAL | Status: DC | PRN

## 2023-11-21 MED ORDER — IOHEXOL 300 MG/ML  SOLN
100.0000 mL | Freq: Once | INTRAMUSCULAR | Status: AC | PRN
  Administered 2023-11-21: 100 mL via INTRAVENOUS

## 2023-11-21 MED ORDER — ACETAMINOPHEN 325 MG PO TABS
650.0000 mg | ORAL_TABLET | Freq: Four times a day (QID) | ORAL | Status: DC | PRN
  Administered 2023-11-23: 650 mg via ORAL
  Filled 2023-11-21: qty 2

## 2023-11-21 MED ORDER — PIPERACILLIN-TAZOBACTAM IN DEX 2-0.25 GM/50ML IV SOLN
2.2500 g | Freq: Four times a day (QID) | INTRAVENOUS | Status: DC
  Administered 2023-11-21 – 2023-11-25 (×15): 2.25 g via INTRAVENOUS
  Filled 2023-11-21 (×18): qty 50

## 2023-11-21 MED ORDER — VANCOMYCIN HCL IN DEXTROSE 1-5 GM/200ML-% IV SOLN
1000.0000 mg | INTRAVENOUS | Status: DC
  Administered 2023-11-25: 1000 mg via INTRAVENOUS
  Filled 2023-11-21: qty 200

## 2023-11-21 MED ORDER — INSULIN ASPART 100 UNIT/ML IJ SOLN: 0.0000 [IU] | Freq: Three times a day (TID) | INTRAMUSCULAR | Status: DC

## 2023-11-21 NOTE — ED Provider Notes (Signed)
 [place] EMERGENCY DEPARTMENT AT [facility] Provider Note   CSN: [id number] Arrival date & time: 11/21/23  7829     History  No chief complaint on file.   Patrick Ibarra is a 43 y.o. male.  The history is provided by the patient, the spouse and medical records. No language interpreter was used.  Groin Pain This is a new problem. The current episode started more than 2 days ago. The problem occurs constantly. The problem has been rapidly worsening. Pertinent negatives include no chest pain, no abdominal pain, no headaches and no shortness of breath. The symptoms are aggravated by walking. Nothing relieves the symptoms. He has tried nothing for the symptoms. The treatment provided no relief.       Home Medications Prior to Admission medications   Medication Sig Start Date End Date Taking? Authorizing Provider  Accu-Chek FastClix Lancets MISC Use as instructed to check blood sugar once daily. E11.9 06/17/19   Marcine Matar, MD  amLODipine (NORVASC) 10 MG tablet TAKE 1 TABLET (10 MG TOTAL) BY MOUTH DAILY. 03/28/20 03/28/21  Marcine Matar, MD  amLODipine (NORVASC) 10 MG tablet Take 1 tablet by mouth at bedtime 05/01/21     amLODipine (NORVASC) 10 MG tablet Take 1 tablet by mouth at bedtime 09/20/21     amLODipine (NORVASC) 10 MG tablet Take 1 tablet by mouth at bedtime 02/07/22     amLODipine (NORVASC) 10 MG tablet Take 1 tablet (10 mg total) by mouth at bedtime. 05/28/22     amLODipine (NORVASC) 10 MG tablet Take 1 tablet (10 mg total) by mouth at bedtime. 09/26/22   Arita Miss, MD  amLODipine (NORVASC) 10 MG tablet Take 1 tablet (10 mg total) by mouth at bedtime. 06/19/23     Blood Glucose Monitoring Suppl (ACCU-CHEK GUIDE ME) w/Device KIT 1 kit by Does not apply route daily. Use as instructed to check blood sugar once daily. E11.9 06/17/19   Marcine Matar, MD  carvedilol (COREG) 12.5 MG tablet TAKE 1 TABLET (12.5 MG TOTAL) BY MOUTH 2 (TWO) TIMES DAILY WITH A  MEAL. 05/01/20 05/11/21  Marcine Matar, MD  carvedilol (COREG) 12.5 MG tablet Take 1 tablet by mouth twice a day 06/14/21     carvedilol (COREG) 25 MG tablet Take 1 tablet by mouth twice a day for high blood pressure 11/08/21     carvedilol (COREG) 25 MG tablet Take 1 tablet by mouth twice a day for high blood pressure 02/07/22     carvedilol (COREG) 25 MG tablet Take 1 tablet (25 mg total) by mouth 2 (two) times daily for blood pressure. 05/28/22     carvedilol (COREG) 25 MG tablet Take 1 tablet (25 mg total) by mouth 2 (two) times daily for high blood pressure. 09/26/22     carvedilol (COREG) 25 MG tablet Take 1 tablet (25 mg total) by mouth 2 (two) times daily for high blood pressure. 06/19/23     glucose blood (ACCU-CHEK GUIDE) test strip Use as instructed to check blood sugar once daily. E11.9 06/17/19   Marcine Matar, MD  hydrALAZINE (APRESOLINE) 25 MG tablet Take 1 tablet by mouth twice a day 01/03/22     hydrALAZINE (APRESOLINE) 25 MG tablet Take 1 tablet (25 mg total) by mouth 2 (two) times daily. 05/28/22     hydrALAZINE (APRESOLINE) 25 MG tablet Take 1 tablet by mouth twice a day 09/26/22     hydrALAZINE (APRESOLINE) 25 MG tablet Take 1 tablet by  mouth twice a day 06/19/23     insulin aspart (NOVOLOG FLEXPEN) 100 UNIT/ML FlexPen 2 units subcut with meals for BS greater than 150. Patient taking differently: Inject 2 Units into the skin daily as needed for high blood sugar. Inject 2 units into the skin three times a day with meals for a BGL greater than 150 11/14/19   Marcine Matar, MD  Insulin Pen Needle (PEN NEEDLES) 31G X 8 MM MISC UAD 11/14/19   Marcine Matar, MD  lovastatin (MEVACOR) 20 MG tablet TAKE 1 TABLET (20 MG TOTAL) BY MOUTH AT BEDTIME. 05/01/20 05/11/21  Marcine Matar, MD  lovastatin (MEVACOR) 20 MG tablet Take 1 tablet by mouth every night 06/14/21     multivitamin (RENA-VIT) TABS tablet Place 1 tablet into feeding tube at bedtime. 12/11/19   Leroy Sea, MD       Allergies    Patient has no known allergies.    Review of Systems   Review of Systems  Constitutional:  Negative for chills, fatigue and fever.  HENT:  Negative for congestion.   Respiratory:  Negative for cough, chest tightness and shortness of breath.   Cardiovascular:  Negative for chest pain, palpitations and leg swelling.  Gastrointestinal:  Negative for abdominal pain, constipation, diarrhea, nausea and vomiting.  Genitourinary:  Positive for genital sores and scrotal swelling. Negative for dysuria, penile discharge and penile swelling.       Does not make much urine with dialysis and ESRD  Musculoskeletal:  Negative for back pain, neck pain and neck stiffness.  Skin:  Positive for color change and rash.  Neurological:  Negative for headaches.  Psychiatric/Behavioral:  Negative for agitation.   All other systems reviewed and are negative.   Physical Exam Updated Vital Signs BP 124/78 (BP Location: Right Arm)   Pulse 82   Resp 18   SpO2 96%  Physical Exam Vitals and nursing note reviewed. Exam conducted with a chaperone present.  Constitutional:      General: He is not in acute distress.    Appearance: He is well-developed.  HENT:     Head: Normocephalic and atraumatic.  Eyes:     Conjunctiva/sclera: Conjunctivae normal.  Cardiovascular:     Rate and Rhythm: Normal rate and regular rhythm.     Heart sounds: No murmur heard. Pulmonary:     Effort: Pulmonary effort is normal. No respiratory distress.     Breath sounds: Normal breath sounds.  Abdominal:     Palpations: Abdomen is soft.     Tenderness: There is no abdominal tenderness.  Genitourinary:    Penis: No tenderness.      Testes:        Right: Tenderness and swelling present.    Musculoskeletal:        General: No swelling.     Cervical back: Neck supple.  Skin:    General: Skin is warm and dry.     Capillary Refill: Capillary refill takes less than 2 seconds.  Neurological:     Mental Status: He  is alert.  Psychiatric:        Mood and Affect: Mood normal.     ED Results / Procedures / Treatments   Labs (all labs ordered are listed, but only abnormal results are displayed) Labs Reviewed  CBC WITH DIFFERENTIAL/PLATELET - Abnormal; Notable for the following components:      Result Value   WBC 11.9 (*)    RBC 2.86 (*)    Hemoglobin  8.4 (*)    HCT 26.1 (*)    RDW 17.9 (*)    Neutro Abs 9.8 (*)    Abs Immature Granulocytes 0.08 (*)    All other components within normal limits  COMPREHENSIVE METABOLIC PANEL - Abnormal; Notable for the following components:   Chloride 94 (*)    Glucose, Bld 121 (*)    BUN 42 (*)    Creatinine, Ser 10.90 (*)    AST 14 (*)    GFR, Estimated 6 (*)    All other components within normal limits  CULTURE, BLOOD (ROUTINE X 2)  CULTURE, BLOOD (ROUTINE X 2)  LACTIC ACID, PLASMA  LACTIC ACID, PLASMA  URINALYSIS, W/ REFLEX TO CULTURE (INFECTION SUSPECTED)    EKG None  Radiology CT PELVIS W CONTRAST Result Date: 11/21/2023 CLINICAL DATA:  43 year old male with soft tissue infection suspected, query Fournier's gangrene. End stage renal disease, complicated diabetes, and foul smelling purulence from lower scrotum. EXAM: CT PELVIS WITH CONTRAST TECHNIQUE: Multidetector CT imaging of the pelvis was performed using the standard protocol following the bolus administration of intravenous contrast. RADIATION DOSE REDUCTION: This exam was performed according to the departmental dose-optimization program which includes automated exposure control, adjustment of the mA and/or kV according to patient size and/or use of iterative reconstruction technique. CONTRAST:  OMNIPAQUE IOHEXOL 300 MG/ML  SOLN COMPARISON:  Noncontrast CT Abdomen and Pelvis 04/03/2017. FINDINGS: Urinary Tract: Decompressed urinary bladder. Kidneys not included. No evidence of hydroureter. Bowel: Nondilated visible large and small bowel loops. Moderate to large volume of ascites in the  peritoneal cavity, with simple fluid density at the level of the abdominal gutters (series 4, image 10), but more complex fluid density in the pelvis (series 4, image 27. No layering hematocrit level. No pneumoperitoneum identified. Vascular/Lymphatic: Diffuse calcified atherosclerosis throughout the lower abdomen and pelvis. Visible major arterial structures appear to be patent and enhancing. Normal caliber distal aorta. No lymphadenopathy identified. Reproductive: There is a degree of generalized body wall edema including in the perineum (series 4, image 57) but there is no perineal or other soft tissue space gas identified. However, positive for irregular rim enhancing abscess of the scrotal wall along the posterior, caudal extent seen on series 4, image 65. This encompasses an area of about 4.5 cm and is well demonstrated on coronal image 42. Superimposed generalized scrotal wall edema. No scrotal wall gas. Superimposed right side hydrocele which is moderate and with fairly simple fluid density (series 4, image 67). Other:  Inferior liver tip is visible, not overtly nodular. Musculoskeletal: No acute or suspicious osseous lesion identified. IMPRESSION: 1. Positive for a 4.5 cm Scrotal Abscess. Posteroinferior scrotal wall affected. Underlying generalized perineum and body wall edema. But no soft tissue gas to strongly suggest a necrotizing infection at this time. Still, recommend Urology consultation. And note also a right scrotal hydrocele. 2. Moderate volume Ascites in the lower abdomen and pelvis, substantially progressed since a 2018 CT and with some complex fluid density. Etiology is unclear. 3. Diffuse severe calcified atherosclerosis. Aortic Atherosclerosis (ICD10-I70.0). Study discussed by telephone with Dr. Lynden Oxford on 11/21/2023 at 1001 hours. Electronically Signed   By: Odessa Fleming M.D.   On: 11/21/2023 10:15    Procedures Procedures    CRITICAL CARE Performed by: Canary Brim  Tene Gato Total critical care time: 45 minutes Critical care time was exclusive of separately billable procedures and treating other patients. Critical care was necessary to treat or prevent imminent or life-threatening deterioration. Critical care was  time spent personally by me on the following activities: development of treatment plan with patient and/or surrogate as well as nursing, discussions with consultants, evaluation of patient's response to treatment, examination of patient, obtaining history from patient or surrogate, ordering and performing treatments and interventions, ordering and review of laboratory studies, ordering and review of radiographic studies, pulse oximetry and re-evaluation of patient's condition.  Medications Ordered in ED Medications  vancomycin (VANCOCIN) IVPB 1000 mg/200 mL premix (has no administration in time range)    And  vancomycin (VANCOCIN) IVPB 1000 mg/200 mL premix (1,000 mg Intravenous New Bag/Given 11/21/23 1012)  fentaNYL (SUBLIMAZE) injection 50 mcg (50 mcg Intravenous Given 11/21/23 0855)  piperacillin-tazobactam (ZOSYN) IVPB 3.375 g (0 g Intravenous Stopped 11/21/23 1000)  clindamycin (CLEOCIN) IVPB 900 mg (0 mg Intravenous Stopped 11/21/23 1033)  iohexol (OMNIPAQUE) 300 MG/ML solution 100 mL (100 mLs Intravenous Contrast Given 11/21/23 8295)    ED Course/ Medical Decision Making/ A&P                                 Medical Decision Making Amount and/or Complexity of Data Reviewed Labs: ordered. Radiology: ordered.  Risk Prescription drug management. Decision regarding hospitalization.    Patrick Ibarra is a 43 y.o. male with a past medical history significant for hypertension, ESRD on dialysis TTS, documentation of diabetes, obesity, previous GI bleed, and previous pancreatitis who presents with groin pain and swelling.  According to patient, for the last 2 weeks he has been having waxing and waning discomfort in his groin where he thinks his  boxers may have rubbed and irritated skin.  He said he was trying to ignore it but over the last 48 hours it is grown much worse and more swollen and painful.  He thinks it is draining somewhat.  He denies fevers or chills and is never had any significant infections or abscesses before.  Denies history of hernias.  Denies any trauma.  He reports he does make some urine and does not seem to be different.  Denies constipation or diarrhea.  Denies any abdominal pain otherwise.  He reports the pain is primarily in his scrotum, inguinal area, and perineum.  He reports it is more red and foul-smelling and is concerned about infection.  History of torsion or other testicular problem.  No history of testicular surgery.  On exam, lungs are clear.  Chest nontender.  Abdomen nontender.  With a chaperone, the groin was examined and it is very foul-smelling, there is swelling asymmetrically worse on the right side of the scrotum going into the perineum and in the right inguinal area.  It is very tender and edematous.  There is some purulence sitting in that area.  There is no significant bleeding seen initially.  Patient did not have significant suprapubic tenderness but the tenderness was going up the side of the inguinal area.  His buttocks were not focally tender.  The testicles themselves were not as tender but the swelling in the scrotum was quite tender.  Clinically I am concerned about scrotal abscess and early Fournier's gangrene given his report that he is not certain he has diabetes and has not been on diabetes medicines and the appearance of his groin.  Will get screening labs, will order a CT scan, and we will start him on antibiotics empirically given the appearance.  I called and spoke to pharmacy who helped guide antibiotic choice for suspected early  Fournier's gangrene.  Anticipate IV antibiotics and admission and possibly even surgical management if it does indeed show evidence of abscess or Fournier's  gangrene with gas.  He will be given some pain medicine while we wait for workup to proceed.   10:10 AM CT was completed and I reviewed the images and am concerned about abscess near the groin.  I called and spoke to radiology who also was reviewing the images and they agree there is a loculated and septated abscess in the base of the scrotum area near the perineum although they do not see subcutaneous gas that would qualify for Fournier's gangrene on imaging.  Clinically I am concerned about this abscess and associated cellulitis spreading to the perineum and up towards his inguinal area.  Otherwise there is ascites and what they report is a hydrocele and very calcified vessels.  Will call urology as I am concerned about this infection needing likely drainage with them and he will need admission for IV antibiotics.  Of note his white count is elevated compared to prior and his hemoglobin seems similar to 8.4.  Creatinine is elevated as expected due to his ESRD.  Will need to discuss with them where the best place to admit given his dialysis needs as well.  11:14 AM Woke to Cam, the urology NP who agrees with the evaluation.  He feels the patient needs a medicine admission at [facility] given his dialysis needs during admission.  They will see the patient when he arrives and we will send him ED to ED so the patient gets expedient evaluation.  The receiving team in the emergency department will need to call urology so they know to see him when he arrives.  I just spoke to medicine who will put in admission orders for [facility] for hospitalist admission after urology evaluation.  11:25 AM Spoke to Dr. Pricilla Loveless who accepts the patient for ED to ED transfer.  Urology will need to be called upon arrival.         Final Clinical Impression(s) / ED Diagnoses Final diagnoses:  Scrotal abscess  Cellulitis of right groin  Leukocytosis, unspecified type    Clinical Impression: 1. Scrotal  abscess   2. Cellulitis of right groin   3. Leukocytosis, unspecified type     Disposition: Admit to medicine with urology evaluation upon ED to ED transfer to [facility].  Accepting provider is Dr. Criss Alvine  This note was prepared with assistance of Dragon voice recognition software. Occasional wrong-word or sound-a-like substitutions may have occurred due to the inherent limitations of voice recognition software.     Koral Thaden, Canary Brim, MD 11/21/23 1126

## 2023-11-21 NOTE — ED Triage Notes (Addendum)
 In for eval of swelling to right testicle. Initially happened a "couple of weeks ago" then resolved and came back recently. Denies difficulty urinating or having BM.  Last oral intake 2200 yesterday 11/20/2023

## 2023-11-21 NOTE — H&P (Signed)
 History and Physical    Patient: Patrick Ibarra NWG:[id number] DOB: 1980/09/30 DOA: 11/21/2023 DOS: the patient was seen and examined on 11/21/2023 PCP: Marcine Matar, MD  Patient coming from:  [facility]  Chief complaint: Chief Complaint  Patient presents with   Testicle Pain   HPI:  Patrick Ibarra is a 43 y.o. male with past medical history  of end-stage renal disease on hemodialysis makes minimal urine, untreated diabetes mellitus type 2 coming from [facility] for scrotal pain with suspected  Fournier's gangrene beginnings.  Per report patient's symptoms have been going on for the past 2 days and progressively worsening.  No reports of fevers chills nausea vomiting diarrhea bleeding urinary complaints.  Patient does have testicular pain with no relief of symptoms and worse with walking.  Patient report sore with foul smelling discharge.   >>ED Course: Pt in ed is alert awake oriented afebrile. >>Vital signs in the ED were notable for the following:  Vitals:   11/21/23 1145 11/21/23 1200 11/21/23 1215 11/21/23 1223  BP: (!) 137/94 (!) 144/90 138/82   Pulse: 73 79 80   Temp:    98.6 F (37 C)  Resp: 19 18 17    Height:      Weight:      SpO2: 91% 91% 92%   TempSrc:    Oral  BMI (Calculated):      >>Labs were notable for the following: CMP shows normal potassium glucose 121 BUN of 42 creatinine 10.90 normal LFTs normal lactic acid. CBC shows white count of 11.9 hemoglobin of 8.4 platelet count 159. Blood cultures obtained in the urgency room.  >>EKG: Independently reviewed: Ordered and pending. >>Imaging and additional notable ED work-up:  Patient had a CT pelvis with contrast showing: 1. Positive for a 4.5 cm Scrotal Abscess. Posteroinferior scrotal wall affected. Underlying generalized perineum and body wall edema. But no soft tissue gas to strongly suggest a necrotizing infection at this time. Still, recommend Urology consultation. And note also a right scrotal hydrocele. 2.  Moderate volume Ascites in the lower abdomen and pelvis, substantially progressed since a 2018 CT and with some complex fluid density. Etiology is unclear. 3. Diffuse severe calcified atherosclerosis. Aortic Atherosclerosis .  >>While in the ED patient received the following: Medications  fentaNYL (SUBLIMAZE) injection 50 mcg (50 mcg Intravenous Given 11/21/23 0855)  vancomycin (VANCOCIN) IVPB 1000 mg/200 mL premix (1,000 mg Intravenous New Bag/Given 11/21/23 1202)    And  vancomycin (VANCOCIN) IVPB 1000 mg/200 mL premix (0 mg Intravenous Stopped 11/21/23 1201)  piperacillin-tazobactam (ZOSYN) IVPB 3.375 g (0 g Intravenous Stopped 11/21/23 1000)  clindamycin (CLEOCIN) IVPB 900 mg (0 mg Intravenous Stopped 11/21/23 1033)  iohexol (OMNIPAQUE) 300 MG/ML solution 100 mL (100 mLs Intravenous Contrast Given 11/21/23 0926)  fentaNYL (SUBLIMAZE) injection 50 mcg (50 mcg Intravenous Given 11/21/23 1048)   Review of Systems  Genitourinary:        Right-sided scrotal pain currently dressed after I&D.  All other systems reviewed and are negative.  Past Medical History:  Diagnosis Date   Diabetes mellitus without complication (HCC)    type 2, diet controlled   ESRD on hemodialysis [facility])    dialysis T-Th-Sat [facility]   Hypertension    LV dysfunction    Obesity    Past Surgical History:  Procedure Laterality Date   AV FISTULA PLACEMENT Left 04/10/2017   Procedure: ARTERIOVENOUS (AV) FISTULA CREATION LEFT ARM;  Surgeon: Fransisco Hertz, MD;  Location: [facility] OR;  Service: Vascular;  Laterality: Left;   AV FISTULA PLACEMENT Left 12/10/2019   Procedure: First stage basilic vein transposition left arm;  Surgeon: Maeola Harman, MD;  Location: [facility] OR;  Service: Vascular;  Laterality: Left;   BASCILIC VEIN TRANSPOSITION Left 02/16/2020   Procedure: SECOND STAGE LEFT BASCILIC VEIN TRANSPOSITION;  Surgeon: Maeola Harman, MD;  Location: [facility] OR;  Service: Vascular;  Laterality: Left;    ESOPHAGOGASTRODUODENOSCOPY (EGD) WITH PROPOFOL N/A 04/04/2017   Procedure: ESOPHAGOGASTRODUODENOSCOPY (EGD) WITH PROPOFOL;  Surgeon: Jeani Hawking, MD;  Location: [facility] ENDOSCOPY;  Service: Endoscopy;  Laterality: N/A;   FISTULA SUPERFICIALIZATION Left 06/22/2019   Procedure: 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 Primary repair.;  Surgeon: Chuck Hint, MD;  Location: [facility] OR;  Service: Vascular;  Laterality: Left;   INSERTION OF DIALYSIS CATHETER Right 04/10/2017   Procedure: INSERTION OF DIALYSIS CATHETER - RIGHT INTERNAL JUGULAR PLACEMENT;  Surgeon: Fransisco Hertz, MD;  Location: [facility] OR;  Service: Vascular;  Laterality: Right;   IR FLUORO GUIDE CV LINE RIGHT  12/06/2019   IR US GUIDE VASC ACCESS RIGHT  12/06/2019   REVISON OF ARTERIOVENOUS FISTULA Left 12/10/2019   Procedure: Revison Of Arteriovenous Fistula;  Surgeon: Maeola Harman, MD;  Location: [facility] OR;  Service: Vascular;  Laterality: Left;   THROMBECTOMY AND REVISION OF ARTERIOVENTOUS (AV) GORETEX  GRAFT Left 09/26/2019   Procedure: Ligation left arm AV fistula with 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 He has never used smokeless tobacco. He reports that he does not currently use alcohol. He reports that he does not use drugs.  No Known Allergies  Family History  Problem Relation Age of Onset   Hypertension Mother    Diabetes Mother    Hypertension Father     Prior to Admission medications   Medication Sig Start Date End Date Taking? Authorizing Provider  Accu-Chek FastClix Lancets MISC Use as instructed to check blood sugar once daily. E11.9 06/17/19   Marcine Matar, MD  amLODipine (NORVASC) 10 MG tablet TAKE 1 TABLET (10 MG TOTAL) BY MOUTH DAILY. 03/28/20 03/28/21  Marcine Matar, MD  amLODipine (NORVASC) 10 MG tablet Take 1 tablet  by mouth at bedtime 05/01/21     amLODipine (NORVASC) 10 MG tablet Take 1 tablet by mouth at bedtime 09/20/21     amLODipine (NORVASC) 10 MG tablet Take 1 tablet by mouth at bedtime 02/07/22     amLODipine (NORVASC) 10 MG tablet Take 1 tablet (10 mg total) by mouth at bedtime. 05/28/22     amLODipine (NORVASC) 10 MG tablet Take 1 tablet (10 mg total) by mouth at bedtime. 09/26/22   Arita Miss, MD  amLODipine (NORVASC) 10 MG tablet Take 1 tablet (10 mg total) by mouth at bedtime. 06/19/23     Blood Glucose Monitoring Suppl (ACCU-CHEK GUIDE ME) w/Device KIT 1 kit by Does not apply route daily. Use as instructed to check blood sugar once daily. E11.9 06/17/19   Marcine Matar, MD  carvedilol (COREG) 12.5 MG tablet TAKE 1 TABLET (12.5 MG TOTAL) BY MOUTH 2 (TWO) TIMES DAILY WITH A MEAL. 05/01/20 05/11/21  Marcine Matar, MD  carvedilol (COREG) 12.5 MG tablet Take 1 tablet by mouth twice a day 06/14/21     carvedilol (COREG) 25 MG tablet Take 1 tablet  by mouth twice a day for high blood pressure 11/08/21     carvedilol (COREG) 25 MG tablet Take 1 tablet by mouth twice a day for high blood pressure 02/07/22     carvedilol (COREG) 25 MG tablet Take 1 tablet (25 mg total) by mouth 2 (two) times daily for blood pressure. 05/28/22     carvedilol (COREG) 25 MG tablet Take 1 tablet (25 mg total) by mouth 2 (two) times daily for high blood pressure. 09/26/22     carvedilol (COREG) 25 MG tablet Take 1 tablet (25 mg total) by mouth 2 (two) times daily for high blood pressure. 06/19/23     glucose blood (ACCU-CHEK GUIDE) test strip Use as instructed to check blood sugar once daily. E11.9 06/17/19   Marcine Matar, MD  hydrALAZINE (APRESOLINE) 25 MG tablet Take 1 tablet by mouth twice a day 01/03/22     hydrALAZINE (APRESOLINE) 25 MG tablet Take 1 tablet (25 mg total) by mouth 2 (two) times daily. 05/28/22     hydrALAZINE (APRESOLINE) 25 MG tablet Take 1 tablet by mouth twice a day 09/26/22     hydrALAZINE  (APRESOLINE) 25 MG tablet Take 1 tablet by mouth twice a day 06/19/23     insulin aspart (NOVOLOG FLEXPEN) 100 UNIT/ML FlexPen 2 units subcut with meals for BS greater than 150. Patient taking differently: Inject 2 Units into the skin daily as needed for high blood sugar. Inject 2 units into the skin three times a day with meals for a BGL greater than 150 11/14/19   Marcine Matar, MD  Insulin Pen Needle (PEN NEEDLES) 31G X 8 MM MISC UAD 11/14/19   Marcine Matar, MD  lovastatin (MEVACOR) 20 MG tablet TAKE 1 TABLET (20 MG TOTAL) BY MOUTH AT BEDTIME. 05/01/20 05/11/21  Marcine Matar, MD  lovastatin (MEVACOR) 20 MG tablet Take 1 tablet by mouth every night 06/14/21     multivitamin (RENA-VIT) TABS tablet Place 1 tablet into feeding tube at bedtime. 12/11/19   Leroy Sea, MD                                                                                   Vitals:   11/21/23 1145 11/21/23 1200 11/21/23 1215 11/21/23 1223  BP: (!) 137/94 (!) 144/90 138/82   Pulse: 73 79 80   Resp: 19 18 17    Temp:    98.6 F (37 C)  TempSrc:    Oral  SpO2: 91% 91% 92%   Weight:      Height:       Physical Exam Vitals and nursing note reviewed.  Constitutional:      General: He is not in acute distress. HENT:     Head: Normocephalic and atraumatic.     Right Ear: Hearing normal.     Left Ear: Hearing normal.     Nose: Nose normal. No nasal deformity.     Mouth/Throat:     Lips: Pink.     Tongue: No lesions.     Pharynx: Oropharynx is clear.  Eyes:     General: Lids are normal.     Extraocular Movements: Extraocular movements intact.  Cardiovascular:     Rate and Rhythm: Normal rate and regular rhythm.     Pulses:          Dorsalis pedis pulses are 2+ on the right side and 2+ on the left side.       Posterior tibial pulses are 2+ on the right side and 2+ on the left side.     Heart sounds: Murmur heard.     Gallop present.  Pulmonary:     Effort: Pulmonary effort is normal.      Breath sounds: Normal breath sounds.  Abdominal:     General: Bowel sounds are normal. There is no distension.     Palpations: Abdomen is soft. There is no mass.     Tenderness: There is no abdominal tenderness.  Genitourinary:    Testes:        Right: Tenderness and swelling present.  Musculoskeletal:     Right lower leg: 1+ Edema present.     Left lower leg: 1+ Edema present.  Skin:    General: Skin is warm.  Neurological:     General: No focal deficit present.     Mental Status: He is alert and oriented to person, place, and time.     Cranial Nerves: Cranial nerves 2-12 are intact.  Psychiatric:        Attention and Perception: Attention normal.        Mood and Affect: Mood normal.        Speech: Speech normal.        Behavior: Behavior normal. Behavior is cooperative.      Labs on Admission: I have personally reviewed following labs and imaging studies  CBC: Recent Labs  Lab 11/21/23 0837  WBC 11.9*  NEUTROABS 9.8*  HGB 8.4*  HCT 26.1*  MCV 91.3  PLT 159   Basic Metabolic Panel: Recent Labs  Lab 11/21/23 0837  NA 135  K 4.4  CL 94*  CO2 28  GLUCOSE 121*  BUN 42*  CREATININE 10.90*  CALCIUM 8.9   GFR: Estimated Creatinine Clearance: 11.6 mL/min (A) (by C-G formula based on SCr of 10.9 mg/dL (H)). Liver Function Tests: Recent Labs  Lab 11/21/23 0837  AST 14*  ALT 9  ALKPHOS 63  BILITOT 1.1  PROT 7.7  ALBUMIN 3.8   No results for input(s): "LIPASE", "AMYLASE" in the last 168 hours. No results for input(s): "AMMONIA" in the last 168 hours. Coagulation Profile: No results for input(s): "INR", "PROTIME" in the last 168 hours. Cardiac Enzymes: No results for input(s): "CKTOTAL", "CKMB", "CKMBINDEX", "TROPONINI" in the last 168 hours. BNP (last 3 results) No results for input(s): "PROBNP" in the last 8760 hours. HbA1C: Recent Labs    11/21/23 1433  HGBA1C 5.3   CBG: No results for input(s): "GLUCAP" in the last 168 hours. Lipid Profile: No  results for input(s): "CHOL", "HDL", "LDLCALC", "TRIG", "CHOLHDL", "LDLDIRECT" in the last 72 hours. Thyroid Function Tests: No results for input(s): "TSH", "T4TOTAL", "FREET4", "T3FREE", "THYROIDAB" in the last 72 hours. Anemia Panel: No results for input(s): "VITAMINB12", "FOLATE", "FERRITIN", "TIBC", "IRON", "RETICCTPCT" in the last 72 hours. Urine analysis:    Component Value Date/Time   COLORURINE STRAW (A) 03/01/2017 0959   APPEARANCEUR CLEAR 03/01/2017 0959   LABSPEC 1.010 03/01/2017 0959   PHURINE 5.0 03/01/2017 0959   GLUCOSEU 50 (A) 03/01/2017 0959   HGBUR SMALL (A) 03/01/2017 0959   BILIRUBINUR NEGATIVE 03/01/2017 0959   KETONESUR NEGATIVE 03/01/2017 0959   PROTEINUR 100 (A)  03/01/2017 0959   UROBILINOGEN 0.2 12/21/2013 1841   NITRITE NEGATIVE 03/01/2017 0959   LEUKOCYTESUR NEGATIVE 03/01/2017 0959   Radiological Exams on Admission: CT PELVIS W CONTRAST Result Date: 11/21/2023 CLINICAL DATA:  43 year old male with soft tissue infection suspected, query Fournier's gangrene. End stage renal disease, complicated diabetes, and foul smelling purulence from lower scrotum. EXAM: CT PELVIS WITH CONTRAST TECHNIQUE: Multidetector CT imaging of the pelvis was performed using the standard protocol following the bolus administration of intravenous contrast. RADIATION DOSE REDUCTION: This exam was performed according to the departmental dose-optimization program which includes automated exposure control, adjustment of the mA and/or kV according to patient size and/or use of iterative reconstruction technique. CONTRAST:  OMNIPAQUE IOHEXOL 300 MG/ML  SOLN COMPARISON:  Noncontrast CT Abdomen and Pelvis 04/03/2017. FINDINGS: Urinary Tract: Decompressed urinary bladder. Kidneys not included. No evidence of hydroureter. Bowel: Nondilated visible large and small bowel loops. Moderate to large volume of ascites in the peritoneal cavity, with simple fluid density at the level of the abdominal  gutters (series 4, image 10), but more complex fluid density in the pelvis (series 4, image 27. No layering hematocrit level. No pneumoperitoneum identified. Vascular/Lymphatic: Diffuse calcified atherosclerosis throughout the lower abdomen and pelvis. Visible major arterial structures appear to be patent and enhancing. Normal caliber distal aorta. No lymphadenopathy identified. Reproductive: There is a degree of generalized body wall edema including in the perineum (series 4, image 57) but there is no perineal or other soft tissue space gas identified. However, positive for irregular rim enhancing abscess of the scrotal wall along the posterior, caudal extent seen on series 4, image 65. This encompasses an area of about 4.5 cm and is well demonstrated on coronal image 42. Superimposed generalized scrotal wall edema. No scrotal wall gas. Superimposed right side hydrocele which is moderate and with fairly simple fluid density (series 4, image 67). Other:  Inferior liver tip is visible, not overtly nodular. Musculoskeletal: No acute or suspicious osseous lesion identified. IMPRESSION: 1. Positive for a 4.5 cm Scrotal Abscess. Posteroinferior scrotal wall affected. Underlying generalized perineum and body wall edema. But no soft tissue gas to strongly suggest a necrotizing infection at this time. Still, recommend Urology consultation. And note also a right scrotal hydrocele. 2. Moderate volume Ascites in the lower abdomen and pelvis, substantially progressed since a 2018 CT and with some complex fluid density. Etiology is unclear. 3. Diffuse severe calcified atherosclerosis. Aortic Atherosclerosis (ICD10-I70.0). Study discussed by telephone with Dr. Lynden Oxford on 11/21/2023 at 1001 hours. Electronically Signed   By: Odessa Fleming M.D.   On: 11/21/2023 10:15   Data Reviewed: Relevant notes from primary care and specialist visits, past discharge summaries as available in EHR, including Care Everywhere. Prior  diagnostic testing as pertinent to current admission diagnoses, Updated medications and problem lists for reconciliation ED course, including vitals, labs, imaging, treatment and response to treatment,Triage notes, nursing and pharmacy notes and ED provider's notes Notable results as noted in HPI.Discussed case with EDMD/ ED APP/ or Specialty MD on call and as needed.  Assessment & Plan Fournier gangrene in male Developing fourniers vs scortal abscess s/p I/D and we will continue Broad spectrum abx coverage. And start tailoring once c/s and back urology consult.   Diabetes mellitus (HCC) Monitor patient's Accu-Cheks  3 times daily before every meal.  Glycemic protocol. Essential hypertension Blood pressure is stable.  Home regimen includes amlodipine, Coreg 25 twice daily, hydralazine 25. Vitals:   11/21/23 0815 11/21/23 0830 11/21/23 1120 11/21/23  1130  BP: 124/78 139/87 (!) 127/95 (!) 137/92   11/21/23 1145 11/21/23 1200 11/21/23 1215  BP: (!) 137/94 (!) 144/90 138/82  Will currently hold his amlodipine and hydralazine and continue Coreg.  Anemia of chronic kidney failure Monitor and follow.  ESRD on dialysis [facility]) HD T/TH/sat and Nephrology consult.  Ascites RUQ USG.   DVT prophylaxis:  Heparin.  Consults:  Urology. Nephrology.  Advance Care Planning:    Code Status: Full Code   Family Communication:  Wife.  Disposition Plan:  Home.  Severity of Illness: The appropriate patient status for this patient is INPATIENT. Inpatient status is judged to be reasonable and necessary in order to provide the required intensity of service to ensure the patient's safety. [name] presenting symptoms, physical exam findings, and initial radiographic and laboratory data in the context of their chronic comorbidities is felt to place them at high risk for further clinical deterioration. Furthermore, it is not anticipated that the patient will be medically stable for discharge from the  hospital within 2 midnights of admission.   * I certify that at the point of admission it is my clinical judgment that the patient will require inpatient hospital care spanning beyond 2 midnights from the point of admission due to high intensity of service, high risk for further deterioration and high frequency of surveillance required.*  Author: Gertha Calkin, MD 11/21/2023 4:20 PM  For on call review [email or web address].   Unresulted Labs (From admission, onward)     Start     Ordered   11/21/23 1618  Glucose, capillary  Once,   R        11/21/23 1618   11/21/23 1548  Aerobic/Anaerobic Culture w Gram Stain (surgical/deep wound)  Once,   R        11/21/23 1550   11/21/23 1355  MRSA Next Gen by PCR, Nasal  Once,   R        11/21/23 1354   11/21/23 0830  Blood culture (routine x 2)  BLOOD CULTURE X 2,   STAT      11/21/23 1610   11/21/23 0830  Urinalysis, w/ Reflex to Culture (Infection Suspected) -Urine, Clean Catch  Once,   R       Question:  Specimen Source  Answer:  Urine, Clean Catch   11/21/23 0832            Orders Placed This Encounter  Procedures   Blood culture (routine x 2)   MRSA Next Gen by PCR, Nasal   Aerobic/Anaerobic Culture w Gram Stain (surgical/deep wound)   CT PELVIS W CONTRAST   US Abdomen Limited RUQ (LIVER/GB)   CBC with Differential   Comprehensive metabolic panel   Lactic acid, plasma   Urinalysis, w/ Reflex to Culture (Infection Suspected) -Urine, Clean Catch   HIV Antibody (routine testing w rflx)   Hemoglobin A1c   Glucose, capillary   Diet heart healthy/carb modified Room service appropriate? Yes; Fluid consistency: Thin   Cardiac Monitoring - Continuous Indefinite   Swab Process:   Considerations:   If MRSA PCR Screen is Positive:   Apply Diabetes Mellitus Care Plan   STAT CBG when hypoglycemia is suspected. If treated, recheck every 15 minutes after each treatment until CBG >/= 70 mg/dl   Refer to Hypoglycemia Protocol Sidebar Report for  treatment of CBG < 70 mg/dl   No HS correction Insulin   Apply Cellulitis Care Plan   Patient has an active order for admit to  inpatient/place in observation   Vital signs   Notify physician (specify)   Mobility Protocol: No Restrictions RN to initiate protocols based on patient's level of care   Refer to Sidebar Report Refer to ICU, Med-Surg, Progressive, and Step-Down Mobility Protocol Sidebars   Initiate Adult Central Line Maintenance and Catheter Protocol for patients with central line (CVC, PICC, Port, Hemodialysis, Trialysis)   If patient diabetic or glucose greater than 140 notify physician for Sliding Scale Insulin Orders   Intake and Output   Neurovascular checks   Do not place and if present remove PureWick   Oral care per nursing protocol   Initiate Oral Care Protocol   Initiate Carrier Fluid Protocol   RN may order General Admission PRN Orders utilizing "General Admission PRN medications" (through manage orders) for the following patient needs: allergy symptoms (Claritin), cold sores (Carmex), cough (Robitussin DM), eye irritation (Liquifilm Tears), hemorrhoids (Tucks), indigestion (Maalox), minor skin irritation (Hydrocortisone Cream), muscle pain Romeo Apple Gay), nose irritation (saline nasal spray) and sore throat (Chloraseptic spray).   Wound care   Full code   Consult to urology   Consult to hospitalist   Consult to nephrology   Consult to urology Consult Timeframe: ROUTINE - requires response within 24 hours; Reason for Consult? scrotal abscess / fourniers infection.   vancomycin per pharmacy consult   Consult to nephrology Consult Timeframe: ROUTINE - requires response within 24 hours; Reason for Consult? HD   Pulse oximetry check with vital signs   EKG 12-Lead   EKG   ECHOCARDIOGRAM COMPLETE   Admit to Inpatient (patient's expected length of stay will be greater than 2 midnights or inpatient only procedure)

## 2023-11-21 NOTE — Assessment & Plan Note (Signed)
 RUQ USG.

## 2023-11-21 NOTE — ED Notes (Signed)
[facility] called for transport to [facility] ED

## 2023-11-21 NOTE — Assessment & Plan Note (Signed)
 HD T/TH/sat and Nephrology consult.

## 2023-11-21 NOTE — Assessment & Plan Note (Signed)
 Monitor patient's Accu-Cheks  3 times daily before every meal.  Glycemic protocol.

## 2023-11-21 NOTE — Assessment & Plan Note (Signed)
 Blood pressure is stable.  Home regimen includes amlodipine, Coreg 25 twice daily, hydralazine 25. Vitals:   11/21/23 0815 11/21/23 0830 11/21/23 1120 11/21/23 1130  BP: 124/78 139/87 (!) 127/95 (!) 137/92   11/21/23 1145 11/21/23 1200 11/21/23 1215  BP: (!) 137/94 (!) 144/90 138/82  Will currently hold his amlodipine and hydralazine and continue Coreg.

## 2023-11-21 NOTE — Assessment & Plan Note (Signed)
 Developing fourniers vs scortal abscess s/p I/D and we will continue Broad spectrum abx coverage. And start tailoring once c/s and back urology consult.

## 2023-11-21 NOTE — Consult Note (Signed)
 Urology Consult Note   Requesting Attending Physician:  Gertha Calkin, MD Service Providing Consult: Urology  Consulting Attending: Dr. Liliane Shi   Reason for Consult:  scrotal abscess  HPI: Patrick Ibarra is seen in consultation for reasons noted above at the request of Gertha Calkin, MD. patient was transferred from [facility] department for treatment of 3.5-4.5 cm right scrotal abscess.  PMH significant for hypertension and ESRD on HD and T2DM.  He reports that he has not required treatment for his type 2 diabetes for many years.  On my arrival patient was alert, oriented, and in no distress.  He was resting in bed and his wife was at bedside.  He reports his right scrotal abscess starting with a chafing rash a few days ago which subsided on its own.  Then it returned and began swelling, ultimately draining a small amount of foul-smelling discharge.   ------------------  Assessment:  43 y.o. male with scrotal abscess and large undifferentiated fluid collection.    Recommendations: #scrotal abscess Bedside I&D.  Please see separate procedure note. Deep wound cultures pending.  Broad-spectrum ABX in the meantime Daily packing with 1 inch iodoform.  Wife feels comfortable handling wound care but asked to participate before discharge. Will follow-up in clinic within 1 week of discharge home. Urology will follow  Case and plan discussed with Dr. Liliane Shi.   Past Medical History: Past Medical History:  Diagnosis Date   Chronic kidney disease    dialysis T-Th-Sat [facility]   Diabetes mellitus without complication (HCC)    type 2, diet controlled   Hypertension    LV dysfunction    Obesity     Past Surgical History:  Past Surgical History:  Procedure Laterality Date   AV FISTULA PLACEMENT Left 04/10/2017   Procedure: ARTERIOVENOUS (AV) FISTULA CREATION LEFT ARM;  Surgeon: Fransisco Hertz, MD;  Location: [facility] OR;  Service: Vascular;  Laterality: Left;   AV  FISTULA PLACEMENT Left 12/10/2019   Procedure: First stage basilic vein transposition left arm;  Surgeon: Maeola Harman, MD;  Location: [facility] OR;  Service: Vascular;  Laterality: Left;   BASCILIC VEIN TRANSPOSITION Left 02/16/2020   Procedure: SECOND STAGE LEFT BASCILIC VEIN TRANSPOSITION;  Surgeon: Maeola Harman, MD;  Location: [facility] OR;  Service: Vascular;  Laterality: Left;   ESOPHAGOGASTRODUODENOSCOPY (EGD) WITH PROPOFOL N/A 04/04/2017   Procedure: ESOPHAGOGASTRODUODENOSCOPY (EGD) WITH PROPOFOL;  Surgeon: Jeani Hawking, MD;  Location: [facility] ENDOSCOPY;  Service: Endoscopy;  Laterality: N/A;   FISTULA SUPERFICIALIZATION Left 06/22/2019   Procedure: 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 OR;  Service: Vascular;  Laterality: Right;   IR FLUORO GUIDE CV LINE RIGHT  12/06/2019   IR US GUIDE VASC ACCESS RIGHT  12/06/2019   REVISON OF ARTERIOVENOUS FISTULA Left 12/10/2019   Procedure: Revison Of Arteriovenous Fistula;  Surgeon: Maeola Harman, MD;  Location: [facility] OR;  Service: Vascular;  Laterality: Left;   THROMBECTOMY AND REVISION OF ARTERIOVENTOUS (AV) GORETEX  GRAFT Left 09/26/2019   Procedure: Ligation left arm AV fistula with excision of left arm AV fistula aneurysm;  Surgeon: Sherren Kerns, MD;  Location: [facility] OR;  Service: Vascular;  Laterality: Left;   TONSILLECTOMY  AND ADENOIDECTOMY      Medication: Current Facility-Administered Medications  Medication Dose Route Frequency Provider Last Rate Last Admin   acetaminophen (TYLENOL) tablet 650 mg  650 mg Oral Q6H PRN Gertha Calkin, MD       Or   acetaminophen (TYLENOL) suppository 650 mg  650 mg Rectal Q6H PRN Gertha Calkin, MD        carvedilol (COREG) tablet 3.125 mg  3.125 mg Oral BID WC Gertha Calkin, MD       clindamycin (CLEOCIN) IVPB 600 mg  600 mg Intravenous Q8H Irena Cords V, MD 100 mL/hr at 11/21/23 1453 600 mg at 11/21/23 1453   heparin injection 5,000 Units  5,000 Units Subcutaneous Q8H Gertha Calkin, MD       hydrALAZINE (APRESOLINE) injection 5 mg  5 mg Intravenous Q4H PRN Gertha Calkin, MD       insulin aspart (novoLOG) injection 0-15 Units  0-15 Units Subcutaneous TID WC Gertha Calkin, MD       morphine (PF) 2 MG/ML injection 2 mg  2 mg Intravenous Q4H PRN Gertha Calkin, MD       nicotine (NICODERM CQ - dosed in mg/24 hours) patch 14 mg  14 mg Transdermal Daily PRN Gertha Calkin, MD       ondansetron (ZOFRAN) tablet 4 mg  4 mg Oral Q6H PRN Gertha Calkin, MD       Or   ondansetron (ZOFRAN) injection 4 mg  4 mg Intravenous Q6H PRN Gertha Calkin, MD       piperacillin-tazobactam (ZOSYN) IVPB 2.25 g  2.25 g Intravenous Q6H Gertha Calkin, MD       pravastatin (PRAVACHOL) tablet 20 mg  20 mg Oral q1800 Irena Cords V, MD       sodium chloride flush (NS) 0.9 % injection 3-10 mL  3-10 mL Intravenous Q12H Irena Cords V, MD   3 mL at 11/21/23 1453   sodium chloride flush (NS) 0.9 % injection 3-10 mL  3-10 mL Intravenous PRN Gertha Calkin, MD       [START ON 11/22/2023] vancomycin (VANCOCIN) IVPB 1000 mg/200 mL premix  1,000 mg Intravenous Q T,Th,Sa-HD Gertha Calkin, MD        Allergies: No Known Allergies  Social History: Social History   Tobacco Use   Smoking status: Never   Smokeless tobacco: Never  Vaping Use   Vaping status: Never Used  Substance Use Topics   Alcohol use: Not Currently   Drug use: No    Family History Family History  Problem Relation Age of Onset   Hypertension Mother    Diabetes Mother    Hypertension Father     Review of Systems  Genitourinary:        Posterior scrotal swelling and foul-smelling drainage.     Objective   Vital signs in last 24 hours: BP 138/82    Pulse 80   Temp 98.6 F (37 C) (Oral)   Resp 17   Ht 6\' 1"  (1.854 m)   Wt 107 kg   SpO2 92%   BMI 31.12 kg/m   Physical Exam General: A&O, resting, appropriate HEENT: [place]/AT Pulmonary: Normal work of breathing Cardiovascular: no cyanosis Abdomen: Soft, NTTP, nondistended GU: Posterior right hemiscrotum swollen and fluctuant with foul-smelling drainage.  Now open around 3 cm and packed with iodoform gauze.  Superficial bleeding. Neuro: Appropriate, no focal neurological deficits  Most Recent Labs: Lab Results  Component  Value Date   WBC 11.9 (H) 11/21/2023   HGB 8.4 (L) 11/21/2023   HCT 26.1 (L) 11/21/2023   PLT 159 11/21/2023    Lab Results  Component Value Date   NA 135 11/21/2023   K 4.4 11/21/2023   CL 94 (L) 11/21/2023   CO2 28 11/21/2023   BUN 42 (H) 11/21/2023   CREATININE 10.90 (H) 11/21/2023   CALCIUM 8.9 11/21/2023   MG 2.4 12/03/2019   PHOS 4.9 (H) 12/08/2019    Lab Results  Component Value Date   INR 1.3 (H) 12/02/2019   APTT 44 (H) 12/02/2019     Urine Culture: @[id number] (laburin,org,[id number])@   IMAGING: CT PELVIS W CONTRAST Result Date: 11/21/2023 CLINICAL DATA:  43 year old male with soft tissue infection suspected, query Fournier's gangrene. End stage renal disease, complicated diabetes, and foul smelling purulence from lower scrotum. EXAM: CT PELVIS WITH CONTRAST TECHNIQUE: Multidetector CT imaging of the pelvis was performed using the standard protocol following the bolus administration of intravenous contrast. RADIATION DOSE REDUCTION: This exam was performed according to the departmental dose-optimization program which includes automated exposure control, adjustment of the mA and/or kV according to patient size and/or use of iterative reconstruction technique. CONTRAST:  OMNIPAQUE IOHEXOL 300 MG/ML  SOLN COMPARISON:  Noncontrast CT Abdomen and Pelvis 04/03/2017. FINDINGS: Urinary Tract: Decompressed urinary bladder. Kidneys not  included. No evidence of hydroureter. Bowel: Nondilated visible large and small bowel loops. Moderate to large volume of ascites in the peritoneal cavity, with simple fluid density at the level of the abdominal gutters (series 4, image 10), but more complex fluid density in the pelvis (series 4, image 27. No layering hematocrit level. No pneumoperitoneum identified. Vascular/Lymphatic: Diffuse calcified atherosclerosis throughout the lower abdomen and pelvis. Visible major arterial structures appear to be patent and enhancing. Normal caliber distal aorta. No lymphadenopathy identified. Reproductive: There is a degree of generalized body wall edema including in the perineum (series 4, image 57) but there is no perineal or other soft tissue space gas identified. However, positive for irregular rim enhancing abscess of the scrotal wall along the posterior, caudal extent seen on series 4, image 65. This encompasses an area of about 4.5 cm and is well demonstrated on coronal image 42. Superimposed generalized scrotal wall edema. No scrotal wall gas. Superimposed right side hydrocele which is moderate and with fairly simple fluid density (series 4, image 67). Other:  Inferior liver tip is visible, not overtly nodular. Musculoskeletal: No acute or suspicious osseous lesion identified. IMPRESSION: 1. Positive for a 4.5 cm Scrotal Abscess. Posteroinferior scrotal wall affected. Underlying generalized perineum and body wall edema. But no soft tissue gas to strongly suggest a necrotizing infection at this time. Still, recommend Urology consultation. And note also a right scrotal hydrocele. 2. Moderate volume Ascites in the lower abdomen and pelvis, substantially progressed since a 2018 CT and with some complex fluid density. Etiology is unclear. 3. Diffuse severe calcified atherosclerosis. Aortic Atherosclerosis (ICD10-I70.0). Study discussed by telephone with Dr. Lynden Oxford on 11/21/2023 at 1001 hours. Electronically  Signed   By: Odessa Fleming M.D.   On: 11/21/2023 10:15    ------  Elmon Kirschner, NP Pager: [phone]   Please contact the urology consult pager with any further questions/concerns.

## 2023-11-21 NOTE — Assessment & Plan Note (Signed)
 Monitor and follow.

## 2023-11-21 NOTE — Procedures (Signed)
   Urology Procedure Note:  Patient transferred from [facility] for treatment of scrotal abscess and Fournier's gangrene rule out.  Case and plan was reviewed with patient and his wife and he consented to bedside incision and drainage.  Fluctuant area of posterior right hemiscrotum was identified and cleaned superficially.  Next around 10 cc of lidocaine was used to make a circumferential block around the wound.  Following this,  a small amount of purulent material was aspirated from the center of the area, confirming location.  After adequate time for anesthetic effect had been provided, around a 2 cm incision was made with an 11 blade with an immediate rush of foul-smelling purulent sanguinous fluid from the area.  This was just large enough to sweep the area with my finger.  No tunneling or loculations were noted.  About 150 cc of sterile water was used to copiously irrigate the area.  Next the area was packed firmly with 1 inch iodoform gauze.  ABD pads and mesh underwear were placed on the patient.  This concluded the procedure.  Approximately 5 mL blood loss.   Elmon Kirschner, NP [facility] Pager: [phone]

## 2023-11-21 NOTE — Consult Note (Signed)
 Renal Service Consult Note [facility]  Patrick Ibarra 11/21/2023 Maree Krabbe, MD Requesting Physician: Dr Renaldo Reel  Reason for Consult: ESRD pt w/ scrotal abscess  HPI: The patient is a 43 y.o. year-old w/ PMH as below who presented to ED for R scrotal pain and swelling. +discharge. Urology saw the patient and did a bedside I&D. Pt was admitted. We are asked to see for dialysis.    Pt seen in room. No c/o's. Pleasant, good compliance w/ dialysis. Last HD was yesterday. No leg swelling, no SOB.    PMH DM2 ESRD on HD HTN   ROS - denies CP, no joint pain, no HA, no blurry vision, no rash, no diarrhea, no nausea/ vomiting   Past Medical History  Past Medical History:  Diagnosis Date   Chronic kidney disease    dialysis T-Th-Sat [facility]   Diabetes mellitus without complication (HCC)    type 2, diet controlled   Hypertension    LV dysfunction    Obesity    Past Surgical History  Past Surgical History:  Procedure Laterality Date   AV FISTULA PLACEMENT Left 04/10/2017   Procedure: ARTERIOVENOUS (AV) FISTULA CREATION LEFT ARM;  Surgeon: Fransisco Hertz, MD;  Location: [facility] OR;  Service: Vascular;  Laterality: Left;   AV FISTULA PLACEMENT Left 12/10/2019   Procedure: First stage basilic vein transposition left arm;  Surgeon: Maeola Harman, MD;  Location: [facility] OR;  Service: Vascular;  Laterality: Left;   BASCILIC VEIN TRANSPOSITION Left 02/16/2020   Procedure: SECOND STAGE LEFT BASCILIC VEIN TRANSPOSITION;  Surgeon: Maeola Harman, MD;  Location: [facility] OR;  Service: Vascular;  Laterality: Left;   ESOPHAGOGASTRODUODENOSCOPY (EGD) WITH PROPOFOL N/A 04/04/2017   Procedure: ESOPHAGOGASTRODUODENOSCOPY (EGD) WITH PROPOFOL;  Surgeon: Jeani Hawking, MD;  Location: [facility] ENDOSCOPY;  Service: Endoscopy;  Laterality: N/A;   FISTULA SUPERFICIALIZATION Left 06/22/2019   Procedure: 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 Primary repair.;   Surgeon: Chuck Hint, MD;  Location: [facility] OR;  Service: Vascular;  Laterality: Left;   INSERTION OF DIALYSIS CATHETER Right 04/10/2017   Procedure: INSERTION OF DIALYSIS CATHETER - RIGHT INTERNAL JUGULAR PLACEMENT;  Surgeon: Fransisco Hertz, MD;  Location: [facility] OR;  Service: Vascular;  Laterality: Right;   IR FLUORO GUIDE CV LINE RIGHT  12/06/2019   IR US GUIDE VASC ACCESS RIGHT  12/06/2019   REVISON OF ARTERIOVENOUS FISTULA Left 12/10/2019   Procedure: Revison Of Arteriovenous Fistula;  Surgeon: Maeola Harman, MD;  Location: [facility] OR;  Service: Vascular;  Laterality: Left;   THROMBECTOMY AND REVISION OF ARTERIOVENTOUS (AV) GORETEX  GRAFT Left 09/26/2019   Procedure: Ligation left arm AV fistula with 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 He has never used smokeless tobacco. He reports that he does not currently use alcohol. He reports that he does not use drugs. Allergies No Known Allergies Home medications Prior to Admission medications   Medication Sig Start Date End Date Taking? Authorizing Provider  amLODipine (NORVASC) 10 MG tablet Take 1 tablet (10 mg total) by mouth at bedtime. 06/19/23  Yes   Accu-Chek FastClix  Lancets MISC Use as instructed to check blood sugar once daily. E11.9 06/17/19   Marcine Matar, MD  Blood Glucose Monitoring Suppl (ACCU-CHEK GUIDE ME) w/Device KIT 1 kit by Does not apply route daily. Use as instructed to check blood sugar once daily. E11.9 06/17/19   Marcine Matar, MD  carvedilol (COREG) 25 MG tablet Take 1 tablet (25 mg total) by mouth 2 (two) times daily for high blood pressure. 06/19/23  Yes   glucose blood (ACCU-CHEK GUIDE) test  strip Use as instructed to check blood sugar once daily. E11.9 06/17/19   Marcine Matar, MD  hydrALAZINE (APRESOLINE) 25 MG tablet Take 1 tablet by mouth twice a day 06/19/23  Yes      Vitals:   11/21/23 1145 11/21/23 1200 11/21/23 1215 11/21/23 1223  BP: (!) 137/94 (!) 144/90 138/82   Pulse: 73 79 80   Resp: 19 18 17    Temp:    98.6 F (37 C)  TempSrc:    Oral  SpO2: 91% 91% 92%   Weight:      Height:       Exam Gen alert, no distress No rash, cyanosis or gangrene Sclera anicteric, throat clear  No jvd or bruits Chest clear bilat to bases, no rales/ wheezing RRR no MRG Abd soft ntnd no mass or ascites +bs GU not examined, post I&D by urology MS no joint effusions or deformity Ext no LE or UE edema, no other edema Neuro is alert, Ox 3 , nf    LUA AVF+bruit       Renal-related home meds: Norvasc 10 Coreg 25 bid Hydralazine 25 bid    OP HD: [facility] TTS 4h   106.5kg   2K bath  B450   AVF       Assessment/ Plan: Scrotal abscess: seen by urology, sp I&D at bedside. IV abx started.  ESRD: on HD TTS. Next HD Sat.  HTN: bp's controlled, 130-150/ 70-90. Cont home meds.  Volume: no vol excess. UF to dry wt tomorrow.  Anemia of esrd: Hb 8.4, follow. Tx prn hb < 7.0.  Secondary hyperparathyroidism: CCa in range, add on phos.       Vinson Moselle  MD [facility] 11/21/2023, 4:05 PM  Recent Labs  Lab 11/21/23 0837  HGB 8.4*  ALBUMIN 3.8  CALCIUM 8.9  CREATININE 10.90*  K 4.4   Inpatient medications:  carvedilol  3.125 mg Oral BID WC   heparin  5,000 Units Subcutaneous Q8H   insulin aspart  0-15 Units Subcutaneous TID WC   pravastatin  20 mg Oral q1800   sodium chloride flush  3-10 mL Intravenous Q12H    clindamycin (CLEOCIN) IV 600 mg (11/21/23 1453)   piperacillin-tazobactam (ZOSYN)  IV     [START ON 11/22/2023] vancomycin     acetaminophen **OR** acetaminophen, hydrALAZINE, morphine injection, nicotine, ondansetron **OR** ondansetron (ZOFRAN) IV, sodium  chloride flush

## 2023-11-21 NOTE — Progress Notes (Signed)
 Pharmacy Antibiotic Note  Patrick Ibarra is a 43 y.o. male admitted on 11/21/2023 with  possible necrotizing fascitis  .  Pharmacy has been consulted for Vancomycin / Zosyn dosing.  With ESRD - HD on TThSat HD  Plan: Vancomycin 2 grams iv x 1 dose in the ED, Vancomycin 1 gram after each HD session Zosyn 2.25 grams iv Q 6 hours Follow up HD schedule, progress  Height: 6\' 1"  (185.4 cm) Weight: 107 kg (235 lb 14.3 oz) IBW/kg (Calculated) : 79.9  Temp (24hrs), Avg:98.4 F (36.9 C), Min:98.2 F (36.8 C), Max:98.6 F (37 C)  Recent Labs  Lab 11/21/23 0837 11/21/23 1036  WBC 11.9*  --   CREATININE 10.90*  --   LATICACIDVEN 1.0 0.8    Estimated Creatinine Clearance: 11.6 mL/min (A) (by C-G formula based on SCr of 10.9 mg/dL (H)).    No Known Allergies    Thank you for allowing pharmacy to be a part of this patient's care. Okey Regal, PharmD 11/21/2023 2:22 PM

## 2023-11-22 ENCOUNTER — Inpatient Hospital Stay (HOSPITAL_COMMUNITY)

## 2023-11-22 DIAGNOSIS — R011 Cardiac murmur, unspecified: Secondary | ICD-10-CM | POA: Diagnosis not present

## 2023-11-22 DIAGNOSIS — N493 Fournier gangrene: Secondary | ICD-10-CM | POA: Diagnosis not present

## 2023-11-22 LAB — CBC WITH DIFFERENTIAL/PLATELET
Abs Immature Granulocytes: 0.05 10*3/uL (ref 0.00–0.07)
Basophils Absolute: 0 10*3/uL (ref 0.0–0.1)
Basophils Relative: 0 %
Eosinophils Absolute: 0.1 10*3/uL (ref 0.0–0.5)
Eosinophils Relative: 1 %
HCT: 20.6 % — ABNORMAL LOW (ref 39.0–52.0)
Hemoglobin: 6.5 g/dL — CL (ref 13.0–17.0)
Immature Granulocytes: 1 %
Lymphocytes Relative: 15 %
Lymphs Abs: 1.4 10*3/uL (ref 0.7–4.0)
MCH: 29.3 pg (ref 26.0–34.0)
MCHC: 31.6 g/dL (ref 30.0–36.0)
MCV: 92.8 fL (ref 80.0–100.0)
Monocytes Absolute: 1 10*3/uL (ref 0.1–1.0)
Monocytes Relative: 10 %
Neutro Abs: 7.1 10*3/uL (ref 1.7–7.7)
Neutrophils Relative %: 73 %
Platelets: 163 10*3/uL (ref 150–400)
RBC: 2.22 MIL/uL — ABNORMAL LOW (ref 4.22–5.81)
RDW: 17.9 % — ABNORMAL HIGH (ref 11.5–15.5)
WBC: 9.6 10*3/uL (ref 4.0–10.5)
nRBC: 0 % (ref 0.0–0.2)

## 2023-11-22 LAB — BASIC METABOLIC PANEL
Anion gap: 13 (ref 5–15)
BUN: 47 mg/dL — ABNORMAL HIGH (ref 6–20)
CO2: 23 mmol/L (ref 22–32)
Calcium: 7.8 mg/dL — ABNORMAL LOW (ref 8.9–10.3)
Chloride: 97 mmol/L — ABNORMAL LOW (ref 98–111)
Creatinine, Ser: 12.42 mg/dL — ABNORMAL HIGH (ref 0.61–1.24)
GFR, Estimated: 5 mL/min — ABNORMAL LOW (ref >60)
Glucose, Bld: 97 mg/dL (ref 70–99)
Potassium: 4.7 mmol/L (ref 3.5–5.1)
Sodium: 133 mmol/L — ABNORMAL LOW (ref 135–145)

## 2023-11-22 LAB — GLUCOSE, CAPILLARY
Glucose-Capillary: 91 mg/dL (ref 70–99)
Glucose-Capillary: 97 mg/dL (ref 70–99)
Glucose-Capillary: 93 mg/dL (ref 70–99)
Glucose-Capillary: 102 mg/dL — ABNORMAL HIGH (ref 70–99)
Glucose-Capillary: 93 mg/dL (ref 70–99)
Glucose-Capillary: 107 mg/dL — ABNORMAL HIGH (ref 70–99)

## 2023-11-22 LAB — CBC
HCT: 23.7 % — ABNORMAL LOW (ref 39.0–52.0)
Hemoglobin: 7.8 g/dL — ABNORMAL LOW (ref 13.0–17.0)
MCH: 29.9 pg (ref 26.0–34.0)
MCHC: 32.9 g/dL (ref 30.0–36.0)
MCV: 90.8 fL (ref 80.0–100.0)
Platelets: 168 10*3/uL (ref 150–400)
RBC: 2.61 MIL/uL — ABNORMAL LOW (ref 4.22–5.81)
RDW: 16.8 % — ABNORMAL HIGH (ref 11.5–15.5)
WBC: 10.9 10*3/uL — ABNORMAL HIGH (ref 4.0–10.5)
nRBC: 0 % (ref 0.0–0.2)

## 2023-11-22 LAB — ECHOCARDIOGRAM COMPLETE
AR max vel: 2.16 cm2
AV Peak grad: 14.9 mmHg
Ao pk vel: 1.93 m/s
Area-P 1/2: 3.02 cm2
Height: 73 in
MV VTI: 2.73 cm2
S' Lateral: 3.4 cm
Weight: 3774.28 [oz_av]

## 2023-11-22 LAB — C-REACTIVE PROTEIN: CRP: 11.9 mg/dL — ABNORMAL HIGH (ref <1.0)

## 2023-11-22 LAB — PREPARE RBC (CROSSMATCH)

## 2023-11-22 LAB — PROTIME-INR
INR: 1.6 — ABNORMAL HIGH (ref 0.8–1.2)
Prothrombin Time: 19.4 s — ABNORMAL HIGH (ref 11.4–15.2)

## 2023-11-22 LAB — HEPATITIS B SURFACE ANTIBODY, QUANTITATIVE: Hep B S AB Quant (Post): 3781 m[IU]/mL

## 2023-11-22 LAB — PROCALCITONIN: Procalcitonin: 3.16 ng/mL

## 2023-11-22 MED ORDER — HYDROMORPHONE HCL 1 MG/ML IJ SOLN: 0.5000 mg | Freq: Once | INTRAMUSCULAR | Status: DC

## 2023-11-22 MED ORDER — HEPARIN SODIUM (PORCINE) 5000 UNIT/ML IJ SOLN
5000.0000 [IU] | Freq: Three times a day (TID) | INTRAMUSCULAR | Status: DC
  Administered 2023-11-24 – 2023-11-27 (×9): 5000 [IU] via SUBCUTANEOUS
  Filled 2023-11-22 (×9): qty 1

## 2023-11-22 MED ORDER — SEVELAMER CARBONATE 800 MG PO TABS
3200.0000 mg | ORAL_TABLET | Freq: Three times a day (TID) | ORAL | Status: DC
  Administered 2023-11-22 – 2023-11-27 (×13): 3200 mg via ORAL
  Filled 2023-11-22 (×13): qty 4

## 2023-11-22 MED ORDER — SODIUM CHLORIDE 0.9% IV SOLUTION: Freq: Once | INTRAVENOUS | Status: AC

## 2023-11-22 MED ORDER — OXIDIZED CELLULOSE EX PADS
1.0000 | MEDICATED_PAD | Freq: Once | CUTANEOUS | Status: AC
  Administered 2023-11-22: 1 via TOPICAL
  Filled 2023-11-22: qty 1

## 2023-11-22 NOTE — Progress Notes (Signed)
 Subjective: Called to bedside due to significant bleeding following I&D of scrotal abscess yesterday.  Objective: Vital signs in last 24 hours: Temp:  [98 F (36.7 C)-98.7 F (37.1 C)] 98 F (36.7 C) (03/22 0938) Pulse Rate:  [70-80] 72 (03/22 0923) Resp:  [13-19] 16 (03/22 0923) BP: (106-144)/(68-95) 106/68 (03/22 0938) SpO2:  [91 %-98 %] 96 % (03/22 0800)  Intake/Output from previous day: 03/21 0701 - 03/22 0700 In: 357.7 [I.V.:6; IV Piggyback:351.7] Out: -   Intake/Output this shift: No intake/output data recorded.  Physical Exam:  General: Alert and oriented GU: Well-formed clot and persistent bleeding seen around the scrotal skin and wound edges.  Patient exquisitely tender to examination.  No fluctuant fluid collection is can be palpated.  No purulent drainage is seen.  Lab Results: Recent Labs    11/21/23 0837 11/22/23 0717  HGB 8.4* 6.5*  HCT 26.1* 20.6*   BMET Recent Labs    11/21/23 0837 11/22/23 0717  NA 135 133*  K 4.4 4.7  CL 94* 97*  CO2 28 23  GLUCOSE 121* 97  BUN 42* 47*  CREATININE 10.90* 12.42*  CALCIUM 8.9 7.8*     Studies/Results: US Abdomen Limited RUQ (LIVER/GB) Result Date: 11/22/2023 CLINICAL DATA:  Increased ascites EXAM: ULTRASOUND ABDOMEN LIMITED RIGHT UPPER QUADRANT COMPARISON:  CT pelvis dated 11/21/2023 FINDINGS: Gallbladder: No gallstones or wall thickening visualized. No sonographic Murphy sign noted by sonographer. Common bile duct: Diameter: 5 mm Liver: No focal lesion identified. Within normal limits in parenchymal echogenicity. Portal vein is patent on color Doppler imaging with normal direction of blood flow towards the liver. Other: Moderate volume ascites. Right kidney is diffusely echogenic and appears atrophic. IMPRESSION: 1. Normal sonographic appearance of the liver. 2. Moderate volume ascites. 3. Diffusely echogenic and atrophic right kidney, likely sequela of chronic medical renal disease. Electronically Signed   By:  Agustin Cree M.D.   On: 11/22/2023 08:52   CT PELVIS W CONTRAST Result Date: 11/21/2023 CLINICAL DATA:  43 year old male with soft tissue infection suspected, query Fournier's gangrene. End stage renal disease, complicated diabetes, and foul smelling purulence from lower scrotum. EXAM: CT PELVIS WITH CONTRAST TECHNIQUE: Multidetector CT imaging of the pelvis was performed using the standard protocol following the bolus administration of intravenous contrast. RADIATION DOSE REDUCTION: This exam was performed according to the departmental dose-optimization program which includes automated exposure control, adjustment of the mA and/or kV according to patient size and/or use of iterative reconstruction technique. CONTRAST:  OMNIPAQUE IOHEXOL 300 MG/ML  SOLN COMPARISON:  Noncontrast CT Abdomen and Pelvis 04/03/2017. FINDINGS: Urinary Tract: Decompressed urinary bladder. Kidneys not included. No evidence of hydroureter. Bowel: Nondilated visible large and small bowel loops. Moderate to large volume of ascites in the peritoneal cavity, with simple fluid density at the level of the abdominal gutters (series 4, image 10), but more complex fluid density in the pelvis (series 4, image 27. No layering hematocrit level. No pneumoperitoneum identified. Vascular/Lymphatic: Diffuse calcified atherosclerosis throughout the lower abdomen and pelvis. Visible major arterial structures appear to be patent and enhancing. Normal caliber distal aorta. No lymphadenopathy identified. Reproductive: There is a degree of generalized body wall edema including in the perineum (series 4, image 57) but there is no perineal or other soft tissue space gas identified. However, positive for irregular rim enhancing abscess of the scrotal wall along the posterior, caudal extent seen on series 4, image 65. This encompasses an area of about 4.5 cm and is well demonstrated on coronal image  42. Superimposed generalized scrotal wall edema. No scrotal  wall gas. Superimposed right side hydrocele which is moderate and with fairly simple fluid density (series 4, image 67). Other:  Inferior liver tip is visible, not overtly nodular. Musculoskeletal: No acute or suspicious osseous lesion identified. IMPRESSION: 1. Positive for a 4.5 cm Scrotal Abscess. Posteroinferior scrotal wall affected. Underlying generalized perineum and body wall edema. But no soft tissue gas to strongly suggest a necrotizing infection at this time. Still, recommend Urology consultation. And note also a right scrotal hydrocele. 2. Moderate volume Ascites in the lower abdomen and pelvis, substantially progressed since a 2018 CT and with some complex fluid density. Etiology is unclear. 3. Diffuse severe calcified atherosclerosis. Aortic Atherosclerosis (ICD10-I70.0). Study discussed by telephone with Dr. Lynden Oxford on 11/21/2023 at 1001 hours. Electronically Signed   By: Odessa Fleming M.D.   On: 11/21/2023 10:15    Assessment/Plan: 43 year old male with a 4 cm scrotal abscess, s/p bedside I&D on 11/21/2023  -Scrotal wound packed with Surgicel, which provided good hemostasis. -Currently receiving 2 units of PRBCs -Continue broad-spectrum antibiotic coverage with clindamycin and Zosyn -Will continue to monitor.  Call back if he continues to have persistent bleeding   LOS: 1 day   Rhoderick Moody, MD [facility] Pager: [phone]  11/22/2023, 10:52 AM

## 2023-11-22 NOTE — Progress Notes (Signed)
 Needs bed change twice throughout the night. Pt's surgical site is bleeding. Bed is soaked  with blood. Pt is actively bleeding during dressing changed. Changed and reinforced dressing. Vitals were stable, awake and oriented, no significant complaints from the patient. Dr. Arlean Hopping made aware. Heparin [place] kept held.

## 2023-11-22 NOTE — Plan of Care (Signed)
  Problem: Education: Goal: Knowledge of General Education information will improve Description: Including pain rating scale, medication(s)/side effects and non-pharmacologic comfort measures Outcome: Progressing   Problem: Health Behavior/Discharge Planning: Goal: Ability to manage health-related needs will improve Outcome: Progressing   Problem: Clinical Measurements: Goal: Ability to maintain clinical measurements within normal limits will improve Outcome: Progressing Goal: Will remain free from infection Outcome: Progressing   Problem: Activity: Goal: Risk for activity intolerance will decrease Outcome: Progressing   Problem: Coping: Goal: Level of anxiety will decrease Outcome: Progressing   Problem: Elimination: Goal: Will not experience complications related to bowel motility Outcome: Progressing   Problem: Pain Managment: Goal: General experience of comfort will improve and/or be controlled Outcome: Progressing

## 2023-11-22 NOTE — Progress Notes (Addendum)
 [facility] night cross cover note:   Patient with some scrotal bleeding overnight following yesterday's scrotal I&D by urology. Associated dressing change underway.  Prophylactic subcutaneous heparin held.  Vital signs appear stable, with heart rates in the 70s, most recent blood pressure 125/84.   There are now orders for STAT CBC as well as type and screen.   Newton Pigg, DO Hospitalist

## 2023-11-22 NOTE — Progress Notes (Signed)
 2956 first unit PRBC started. 1142 am transfusion completed. Patient tolerated fine. No signs or symptoms. Vitals charted in flowsheet.

## 2023-11-22 NOTE — Progress Notes (Signed)
 1219 pm second unit PRBC started. Scrotum and packing assessed. Packing clean and dry. No new drainage or blood present at this time. 1444 transfusion completed. Scrotum and packing reassessed. Packing clean and dry. No new drainage or blood at this time. Patient tolerated fine. No signs or symptoms. Vitals charted in flowsheet. H and H scheduled for 2000.

## 2023-11-22 NOTE — Progress Notes (Signed)
 PROGRESS NOTE                                                                                                                                                                                                             Patient Demographics:    Patrick Ibarra, is a 43 y.o. male, DOB - [birth year], WGN:[id number]  Outpatient Primary MD for the patient is Marcine Matar, MD    LOS - 1  Admit date - 11/21/2023    Chief Complaint  Patient presents with   Testicle Pain       Brief Narrative (HPI from H&P)   43 y.o. male with past medical history  of end-stage renal disease on hemodialysis makes minimal urine, untreated diabetes mellitus type 2 coming from [facility] for scrotal pain with suspected  Fournier's gangrene beginnings.  Per report patient's symptoms have been going on for the past 2 days and progressively worsening.  No reports of fevers chills nausea vomiting diarrhea bleeding urinary complaints.  Patient does have testicular pain with no relief of symptoms and worse with walking.  Patient report sore with foul smelling discharge.    Subjective:    Patrick Ibarra today has, No headache, No chest pain, No abdominal pain - No Nausea, No new weakness tingling or numbness, no shortness of breath.   Assessment  & Plan :    Scrotal abscess.  Seen by urology, underwent incision and drainage on 11/21/2023, follow cultures, continue empiric IV antibiotics for now which include vancomycin, clindamycin and Zosyn.  Will monitor cultures and adjust.  Appreciate urology input.  Ascites RUQ USG is nonacute.  Fluid overload likely due to underlying ESRD  Anemia of chronic disease due to underlying ESRD along with perioperative blood loss anemia. Adding 2 units of packed RBC on 11/22/2023, conveyed to urology and nephrology.  Essential hypertension  Blood pressure is stable.  Home regimen includes amlodipine, Coreg 25 twice daily,  hydralazine 25.   ESRD on dialysis [facility]) HD T/TH/sat and Nephrology consult.   Diabetes mellitus (HCC) Monitor patient's Accu-Cheks  3 times daily before every meal.  Glycemic protocol.  CBG (last 3)  Recent Labs    11/22/23 0125 11/22/23 0353 11/22/23 0803  GLUCAP 91 97 93   Lab Results  Component Value Date   HGBA1C 5.3 11/21/2023  Condition -   Guarded  Family Communication  : Family member bedside on 11/22/2023  Code Status : Full code  Consults  : Urology, nephrology  PUD Prophylaxis :     Procedures  :     CT - 1. Positive for a 4.5 cm Scrotal Abscess. Posteroinferior scrotal wall affected. Underlying generalized perineum and body wall edema. But no soft tissue gas to strongly suggest a necrotizing infection at this time. Still, recommend Urology consultation. And note also a right scrotal hydrocele. 2. Moderate volume Ascites in the lower abdomen and pelvis, substantially progressed since a 2018 CT and with some complex fluid density. Etiology is unclear. 3. Diffuse severe calcified atherosclerosis. Aortic Atherosclerosis (ICD10-I70.0).   Scrotal incision and drainage by urology on 11/21/2023      Disposition Plan  :    Status is: Inpatient   DVT Prophylaxis  :    heparin injection 5,000 Units Start: 11/24/23 0600    Lab Results  Component Value Date   PLT 163 11/22/2023    Diet :  Diet Order             Diet heart healthy/carb modified Room service appropriate? Yes; Fluid consistency: Thin  Diet effective now                    Inpatient Medications  Scheduled Meds:  carvedilol  25 mg Oral BID WC   Chlorhexidine Gluconate Cloth  6 each Topical Q0600   [START ON 11/24/2023] heparin  5,000 Units Subcutaneous Q8H    HYDROmorphone (DILAUDID) injection  0.5 mg Intravenous Once   insulin aspart  0-15 Units Subcutaneous TID WC   pravastatin  20 mg Oral q1800   sodium chloride flush  3-10 mL Intravenous Q12H   Continuous  Infusions:  clindamycin (CLEOCIN) IV 600 mg (11/22/23 0547)   piperacillin-tazobactam (ZOSYN)  IV 2.25 g (11/22/23 0411)   vancomycin     PRN Meds:.acetaminophen **OR** acetaminophen, hydrALAZINE, morphine injection, nicotine, ondansetron **OR** ondansetron (ZOFRAN) IV, sodium chloride flush    Objective:   Vitals:   11/22/23 0355 11/22/23 0800 11/22/23 0923 11/22/23 0938  BP: 125/84 119/79 116/71 106/68  Pulse: 71 70 72   Resp: 18 16 16    Temp: 98.1 F (36.7 C) 98 F (36.7 C) 98 F (36.7 C) 98 F (36.7 C)  TempSrc: Oral Oral Oral Oral  SpO2: 97% 96%    Weight:      Height:        Wt Readings from Last 3 Encounters:  11/21/23 107 kg  03/27/20 (!) 106.2 kg  02/16/20 95.3 kg     Intake/Output Summary (Last 24 hours) at 11/22/2023 1128 Last data filed at 11/21/2023 2200 Gross per 24 hour  Intake 206 ml  Output --  Net 206 ml     Physical Exam  Awake Alert, No new F.N deficits, Normal affect [place].AT,PERRAL Supple Neck, No JVD,   Symmetrical Chest wall movement, Good air movement bilaterally, CTAB RRR,No Gallops,Rubs or new Murmurs,  +ve B.Sounds, Abd Soft, No tenderness,   No edema, scrotum under bandage soaked with blood   Data Review:    Recent Labs  Lab 11/21/23 0837 11/22/23 0717  WBC 11.9* 9.6  HGB 8.4* 6.5*  HCT 26.1* 20.6*  PLT 159 163  MCV 91.3 92.8  MCH 29.4 29.3  MCHC 32.2 31.6  RDW 17.9* 17.9*  LYMPHSABS 1.1 1.4  MONOABS 1.0 1.0  EOSABS 0.0 0.1  BASOSABS 0.0 0.0  Recent Labs  Lab 11/21/23 0837 11/21/23 1036 11/21/23 1433 11/22/23 0717  NA 135  --   --  133*  K 4.4  --   --  4.7  CL 94*  --   --  97*  CO2 28  --   --  23  ANIONGAP 13  --   --  13  GLUCOSE 121*  --   --  97  BUN 42*  --   --  47*  CREATININE 10.90*  --   --  12.42*  AST 14*  --   --   --   ALT 9  --   --   --   ALKPHOS 63  --   --   --   BILITOT 1.1  --   --   --   ALBUMIN 3.8  --   --   --   CRP  --   --   --  11.9*  PROCALCITON  --   --   --  3.16   LATICACIDVEN 1.0 0.8  --   --   INR  --   --   --  1.6*  HGBA1C  --   --  5.3  --   CALCIUM 8.9  --   --  7.8*      Recent Labs  Lab 11/21/23 0837 11/21/23 1036 11/21/23 1433 11/22/23 0717  CRP  --   --   --  11.9*  PROCALCITON  --   --   --  3.16  LATICACIDVEN 1.0 0.8  --   --   INR  --   --   --  1.6*  HGBA1C  --   --  5.3  --   CALCIUM 8.9  --   --  7.8*    --------------------------------------------------------------------------------------------------------------- Lab Results  Component Value Date   CHOL 232 (H) 06/10/2019   HDL 54 06/10/2019   LDLCALC 136 (H) 06/10/2019   TRIG 204 (H) 12/02/2019   CHOLHDL 4.3 06/10/2019    Lab Results  Component Value Date   HGBA1C 5.3 11/21/2023   No results for input(s): "TSH", "T4TOTAL", "FREET4", "T3FREE", "THYROIDAB" in the last 72 hours. No results for input(s): "VITAMINB12", "FOLATE", "FERRITIN", "TIBC", "IRON", "RETICCTPCT" in the last 72 hours. ------------------------------------------------------------------------------------------------------------------ Cardiac Enzymes No results for input(s): "CKMB", "TROPONINI", "MYOGLOBIN" in the last 168 hours.  Invalid input(s): "CK"  Micro Results Recent Results (from the past 240 hours)  Blood culture (routine x 2)     Status: None (Preliminary result)   Collection Time: 11/21/23  8:30 AM   Specimen: BLOOD  Result Value Ref Range Status   Specimen Description   Final    BLOOD RIGHT ANTECUBITAL Performed at [facility], [address] [place], [place] [address]    Special Requests   Final    Blood Culture adequate volume BOTTLES DRAWN AEROBIC AND ANAEROBIC Performed at [facility], [address], [place], [place] [address]    Culture   Final    NO GROWTH < 24 HOURS Performed at [facility], [address], [place], [place] [address]    Report Status PENDING  Incomplete  Blood culture (routine x 2)     Status:  None (Preliminary result)   Collection Time: 11/21/23  8:50 AM   Specimen: BLOOD RIGHT FOREARM  Result Value Ref Range Status   Specimen Description   Final    BLOOD RIGHT FOREARM Performed at [facility], [address],  [place], [place] [address]    Special Requests   Final    BOTTLES DRAWN AEROBIC AND ANAEROBIC Blood Culture adequate volume Performed at [facility], [address] [place], [place] [address]    Culture   Final    NO GROWTH < 24 HOURS Performed at [facility], [address], [place], [place] [address]    Report Status PENDING  Incomplete  MRSA Next Gen by PCR, Nasal     Status: None   Collection Time: 11/21/23  1:55 PM   Specimen: Nasal Mucosa; Nasal Swab  Result Value Ref Range Status   MRSA by PCR Next Gen NOT DETECTED NOT DETECTED Final    Comment: (NOTE) The GeneXpert MRSA Assay (FDA approved for NASAL specimens only), is one component of a comprehensive MRSA colonization surveillance program. It is not intended to diagnose MRSA infection nor to guide or monitor treatment for MRSA infections. Test performance is not FDA approved in patients less than 41 years old. Performed at [facility], [address], [place], [place] [address]   Aerobic Culture w Gram Stain (superficial specimen)     Status: None (Preliminary result)   Collection Time: 11/21/23  3:30 PM   Specimen: Scrotum; Abscess  Result Value Ref Range Status   Specimen Description SCROTUM ABSCESS  Final   Special Requests NONE  Final   Gram Stain   Final    ABUNDANT WBC PRESENT, PREDOMINANTLY PMN FEW GRAM POSITIVE COCCI RARE GRAM NEGATIVE RODS    Culture   Final    NO GROWTH < 24 HOURS Performed at [facility], [address], [place], [place] [address]    Report Status PENDING  Incomplete    Radiology Report US Abdomen Limited RUQ (LIVER/GB) Result Date: 11/22/2023 CLINICAL DATA:  Increased ascites EXAM: ULTRASOUND  ABDOMEN LIMITED RIGHT UPPER QUADRANT COMPARISON:  CT pelvis dated 11/21/2023 FINDINGS: Gallbladder: No gallstones or wall thickening visualized. No sonographic Murphy sign noted by sonographer. Common bile duct: Diameter: 5 mm Liver: No focal lesion identified. Within normal limits in parenchymal echogenicity. Portal vein is patent on color Doppler imaging with normal direction of blood flow towards the liver. Other: Moderate volume ascites. Right kidney is diffusely echogenic and appears atrophic. IMPRESSION: 1. Normal sonographic appearance of the liver. 2. Moderate volume ascites. 3. Diffusely echogenic and atrophic right kidney, likely sequela of chronic medical renal disease. Electronically Signed   By: Agustin Cree M.D.   On: 11/22/2023 08:52   CT PELVIS W CONTRAST Result Date: 11/21/2023 CLINICAL DATA:  43 year old male with soft tissue infection suspected, query Fournier's gangrene. End stage renal disease, complicated diabetes, and foul smelling purulence from lower scrotum. EXAM: CT PELVIS WITH CONTRAST TECHNIQUE: Multidetector CT imaging of the pelvis was performed using the standard protocol following the bolus administration of intravenous contrast. RADIATION DOSE REDUCTION: This exam was performed according to the departmental dose-optimization program which includes automated exposure control, adjustment of the mA and/or kV according to patient size and/or use of iterative reconstruction technique. CONTRAST:  OMNIPAQUE IOHEXOL 300 MG/ML  SOLN COMPARISON:  Noncontrast CT Abdomen and Pelvis 04/03/2017. FINDINGS: Urinary Tract: Decompressed urinary bladder. Kidneys not included. No evidence of hydroureter. Bowel: Nondilated visible large and small bowel loops. Moderate to large volume of ascites in the peritoneal cavity, with simple fluid density at the level of the abdominal gutters (series 4, image 10), but more complex fluid density in the pelvis (series 4, image 27. No layering hematocrit  level. No pneumoperitoneum identified. Vascular/Lymphatic: Diffuse calcified atherosclerosis throughout the lower abdomen and pelvis. Visible major arterial structures appear to be patent and enhancing. Normal caliber distal aorta. No lymphadenopathy identified. Reproductive: There is a degree of generalized body wall edema including in the perineum (series 4, image 57) but there is no perineal or other soft tissue space gas identified. However, positive for irregular rim enhancing abscess of the scrotal wall along the posterior, caudal extent seen on series 4, image 65. This encompasses an area of about 4.5 cm and is well demonstrated on coronal image 42. Superimposed generalized scrotal wall edema. No scrotal wall gas. Superimposed right side hydrocele which is moderate and with fairly simple fluid density (series 4, image 67). Other:  Inferior liver tip is visible, not overtly nodular. Musculoskeletal: No acute or suspicious osseous lesion identified. IMPRESSION: 1. Positive for a 4.5 cm Scrotal Abscess. Posteroinferior scrotal wall affected. Underlying generalized perineum and body wall edema. But no soft tissue gas to strongly suggest a necrotizing infection at this time. Still, recommend Urology consultation. And note also a right scrotal hydrocele. 2. Moderate volume Ascites in the lower abdomen and pelvis, substantially progressed since a 2018 CT and with some complex fluid density. Etiology is unclear. 3. Diffuse severe calcified atherosclerosis. Aortic Atherosclerosis (ICD10-I70.0). Study discussed by telephone with Dr. Lynden Oxford on 11/21/2023 at 1001 hours. Electronically Signed   By: Odessa Fleming M.D.   On: 11/21/2023 10:15     Signature  -   Susa Raring M.D on 11/22/2023 at 11:28 AM   -  To page go to [email or web address]

## 2023-11-22 NOTE — Progress Notes (Addendum)
  [facility] Progress Note   Subjective:   Seen in room. S/p I&D of scrotal abscess. Had a lot of bleeding overnight. Hb 6.5. Transfusion in progress at bedside. Dialysis today   Objective Vitals:   11/22/23 0923 11/22/23 0938 11/22/23 1142 11/22/23 1219  BP: 116/71 106/68  109/76  Pulse: 72   75  Resp: 16   16  Temp: 98 F (36.7 C) 98 F (36.7 C) 98 F (36.7 C) 97.8 F (36.6 C)  TempSrc: Oral Oral Oral Oral  SpO2:      Weight:      Height:         Additional Objective Labs: Basic Metabolic Panel: Recent Labs  Lab 11/21/23 0837 11/22/23 0717  NA 135 133*  K 4.4 4.7  CL 94* 97*  CO2 28 23  GLUCOSE 121* 97  BUN 42* 47*  CREATININE 10.90* 12.42*  CALCIUM 8.9 7.8*   CBC: Recent Labs  Lab 11/21/23 0837 11/22/23 0717  WBC 11.9* 9.6  NEUTROABS 9.8* 7.1  HGB 8.4* 6.5*  HCT 26.1* 20.6*  MCV 91.3 92.8  PLT 159 163   Blood Culture    Component Value Date/Time   SDES SCROTUM ABSCESS 11/21/2023 1530   SPECREQUEST NONE 11/21/2023 1530   CULT  11/21/2023 1530    NO GROWTH < 24 HOURS Performed at [facility], [address], [place], [place] [address]    REPTSTATUS PENDING 11/21/2023 1530    Physical Exam General: Alert, nad Heart: RRR Lungs: Clear, normal wob Abdomen: soft non-tender GU: deferred  Extremities: No LE edema  Dialysis Access: LUE AVF +bruit   Medications:  clindamycin (CLEOCIN) IV 600 mg (11/22/23 0547)   piperacillin-tazobactam (ZOSYN)  IV 2.25 g (11/22/23 1145)   vancomycin      carvedilol  25 mg Oral BID WC   Chlorhexidine Gluconate Cloth  6 each Topical Q0600   [START ON 11/24/2023] heparin  5,000 Units Subcutaneous Q8H    HYDROmorphone (DILAUDID) injection  0.5 mg Intravenous Once   insulin aspart  0-15 Units Subcutaneous TID WC   pravastatin  20 mg Oral q1800   sodium chloride flush  3-10 mL Intravenous Q12H    Dialysis Orders:  [facility] TTS 4h   106.5kg   2K bath  B450   AVF   Heparin 7000 -Mircera  225 q 2 wks (last 3/20) -Hectorol 3  HD     Assessment/Plan: Scrotal abscess - urology following, s/p I&D at bedside. On IV Zosyn, clindamycin.  ESRD - on HD TTS. HD today  HTN - BP controlled. Continue home meds  Volume - no vol excess. UF to dry wt  Anemia - ESRD + ABLA from abscess. Hgb dropping. 2 units prbcs today. On ESA outpatient, recently dosed.  Secondary hyperparathyroidism: Continue Hectorol. Renvela binder     Tomasa Blase PA-C [facility] 11/22/2023,12:23 PM  Pt seen, examined and agree w A/P as above.  Vinson Moselle MD  [facility] 11/22/2023, 5:30 PM

## 2023-11-22 NOTE — Progress Notes (Signed)
 PT Cancellation Note  Patient Details Name: Patrick Ibarra MRN: [id number] DOB: Feb 07, 1981   Cancelled Treatment:    Reason Eval/Treat Not Completed: Patient at procedure or test/unavailable  Checked on pt this morning, was getting set-up with first transfusion. Now undergoing test in room with ultrasound. Will attempt follow-up evaluation later as schedule permits or tomorrow if unable to return this evening.   Kathlyn Sacramento, PT, DPT [facility] Physical Therapist Office: [phone] Website: [email or web address]   Berton Mount 11/22/2023, 2:14 PM

## 2023-11-22 NOTE — Progress Notes (Signed)
 Assessed scrotum and packing. Packing dry and no bleeding present at this time.

## 2023-11-23 DIAGNOSIS — N493 Fournier gangrene: Secondary | ICD-10-CM | POA: Diagnosis not present

## 2023-11-23 LAB — GLUCOSE, CAPILLARY
Glucose-Capillary: 83 mg/dL (ref 70–99)
Glucose-Capillary: 88 mg/dL (ref 70–99)

## 2023-11-23 LAB — CBC WITH DIFFERENTIAL/PLATELET
Abs Immature Granulocytes: 0.04 10*3/uL (ref 0.00–0.07)
Basophils Absolute: 0 10*3/uL (ref 0.0–0.1)
Basophils Relative: 0 %
Eosinophils Absolute: 0.2 10*3/uL (ref 0.0–0.5)
Eosinophils Relative: 2 %
HCT: 24.8 % — ABNORMAL LOW (ref 39.0–52.0)
Hemoglobin: 8.2 g/dL — ABNORMAL LOW (ref 13.0–17.0)
Immature Granulocytes: 0 %
Lymphocytes Relative: 16 %
Lymphs Abs: 1.5 10*3/uL (ref 0.7–4.0)
MCH: 30 pg (ref 26.0–34.0)
MCHC: 33.1 g/dL (ref 30.0–36.0)
MCV: 90.8 fL (ref 80.0–100.0)
Monocytes Absolute: 1.1 10*3/uL — ABNORMAL HIGH (ref 0.1–1.0)
Monocytes Relative: 11 %
Neutro Abs: 6.9 10*3/uL (ref 1.7–7.7)
Neutrophils Relative %: 71 %
Platelets: 184 10*3/uL (ref 150–400)
RBC: 2.73 MIL/uL — ABNORMAL LOW (ref 4.22–5.81)
RDW: 16.8 % — ABNORMAL HIGH (ref 11.5–15.5)
WBC: 9.9 10*3/uL (ref 4.0–10.5)
nRBC: 0 % (ref 0.0–0.2)

## 2023-11-23 LAB — TYPE AND SCREEN
ABO/RH(D): O POS
Antibody Screen: NEGATIVE
Unit division: 0
Unit division: 0

## 2023-11-23 LAB — BPAM RBC
Blood Product Expiration Date: 202504162359
Blood Product Expiration Date: 202504162359
ISSUE DATE / TIME: 202503220919
ISSUE DATE / TIME: 202503221209
Unit Type and Rh: 5100
Unit Type and Rh: 5100

## 2023-11-23 LAB — PROCALCITONIN: Procalcitonin: 2.84 ng/mL

## 2023-11-23 LAB — RENAL FUNCTION PANEL
Albumin: 2.5 g/dL — ABNORMAL LOW (ref 3.5–5.0)
Anion gap: 14 (ref 5–15)
BUN: 53 mg/dL — ABNORMAL HIGH (ref 6–20)
CO2: 25 mmol/L (ref 22–32)
Calcium: 8 mg/dL — ABNORMAL LOW (ref 8.9–10.3)
Chloride: 95 mmol/L — ABNORMAL LOW (ref 98–111)
Creatinine, Ser: 13.69 mg/dL — ABNORMAL HIGH (ref 0.61–1.24)
GFR, Estimated: 4 mL/min — ABNORMAL LOW (ref >60)
Glucose, Bld: 97 mg/dL (ref 70–99)
Phosphorus: 7 mg/dL — ABNORMAL HIGH (ref 2.5–4.6)
Potassium: 5.4 mmol/L — ABNORMAL HIGH (ref 3.5–5.1)
Sodium: 134 mmol/L — ABNORMAL LOW (ref 135–145)

## 2023-11-23 LAB — C-REACTIVE PROTEIN: CRP: 9.7 mg/dL — ABNORMAL HIGH (ref <1.0)

## 2023-11-23 LAB — MAGNESIUM: Magnesium: 1.9 mg/dL (ref 1.7–2.4)

## 2023-11-23 MED ORDER — LIDOCAINE-PRILOCAINE 2.5-2.5 % EX CREA: 1.0000 | TOPICAL_CREAM | CUTANEOUS | Status: DC | PRN

## 2023-11-23 MED ORDER — HEPARIN SODIUM (PORCINE) 1000 UNIT/ML DIALYSIS: 2500.0000 [IU] | Freq: Once | INTRAMUSCULAR | Status: DC

## 2023-11-23 MED ORDER — HEPARIN SODIUM (PORCINE) 1000 UNIT/ML DIALYSIS: 2000.0000 [IU] | INTRAMUSCULAR | Status: DC | PRN

## 2023-11-23 MED ORDER — SODIUM ZIRCONIUM CYCLOSILICATE 10 G PO PACK
10.0000 g | PACK | Freq: Three times a day (TID) | ORAL | Status: AC
  Administered 2023-11-23 (×2): 10 g via ORAL
  Filled 2023-11-23 (×3): qty 1

## 2023-11-23 MED ORDER — HEPARIN SODIUM (PORCINE) 1000 UNIT/ML DIALYSIS: 1000.0000 [IU] | INTRAMUSCULAR | Status: DC | PRN

## 2023-11-23 MED ORDER — ANTICOAGULANT SODIUM CITRATE 4% (200MG/5ML) IV SOLN: 5.0000 mL | Status: DC | PRN

## 2023-11-23 MED ORDER — ALTEPLASE 2 MG IJ SOLR: 2.0000 mg | Freq: Once | INTRAMUSCULAR | Status: DC | PRN

## 2023-11-23 MED ORDER — PENTAFLUOROPROP-TETRAFLUOROETH EX AERO: 1.0000 | INHALATION_SPRAY | CUTANEOUS | Status: DC | PRN

## 2023-11-23 MED ORDER — LIDOCAINE HCL (PF) 1 % IJ SOLN: 5.0000 mL | INTRAMUSCULAR | Status: DC | PRN

## 2023-11-23 MED ORDER — NEPRO/CARBSTEADY PO LIQD: 237.0000 mL | ORAL | Status: DC | PRN

## 2023-11-23 NOTE — Plan of Care (Signed)

## 2023-11-23 NOTE — Progress Notes (Signed)
  [facility] Progress Note   Subjective:   Did not get dialysis yesterday d/t high inpatient census. For roll over HD today Seen in room. No complaints. No chest pain, sob.   Objective Vitals:   11/22/23 2000 11/23/23 0020 11/23/23 0342 11/23/23 0800  BP: 124/80 (!) 117/90 133/83 132/87  Pulse: 75 72 74 74  Resp: 18 15 18 20   Temp: 97.8 F (36.6 C) 97.9 F (36.6 C) 98.2 F (36.8 C) 98 F (36.7 C)  TempSrc: Oral Oral Oral Oral  SpO2: 99% 99% 95% 91%  Weight:      Height:        Additional Objective Labs: Basic Metabolic Panel: Recent Labs  Lab 11/21/23 0837 11/22/23 0717 11/23/23 0538  NA 135 133* 134*  K 4.4 4.7 5.4*  CL 94* 97* 95*  CO2 28 23 25   GLUCOSE 121* 97 97  BUN 42* 47* 53*  CREATININE 10.90* 12.42* 13.69*  CALCIUM 8.9 7.8* 8.0*  PHOS  --   --  7.0*   CBC: Recent Labs  Lab 11/21/23 0837 11/22/23 0717 11/22/23 1954 11/23/23 0538  WBC 11.9* 9.6 10.9* 9.9  NEUTROABS 9.8* 7.1  --  6.9  HGB 8.4* 6.5* 7.8* 8.2*  HCT 26.1* 20.6* 23.7* 24.8*  MCV 91.3 92.8 90.8 90.8  PLT 159 163 168 184   Blood Culture    Component Value Date/Time   SDES SCROTUM ABSCESS 11/21/2023 1530   SPECREQUEST NONE 11/21/2023 1530   CULT  11/21/2023 1530    NO GROWTH < 24 HOURS Performed at [facility], [address] [place], [place] [address]    REPTSTATUS PENDING 11/21/2023 1530    Physical Exam General: Alert, nad Heart: RRR Lungs: Clear, normal wob Abdomen: soft non-tender GU: deferred  Extremities: No LE edema  Dialysis Access: LUE AVF +bruit   Medications:  clindamycin (CLEOCIN) IV 600 mg (11/23/23 0513)   piperacillin-tazobactam (ZOSYN)  IV 2.25 g (11/23/23 0512)   vancomycin      carvedilol  25 mg Oral BID WC   Chlorhexidine Gluconate Cloth  6 each Topical Q0600   [START ON 11/24/2023] heparin  5,000 Units Subcutaneous Q8H    HYDROmorphone (DILAUDID) injection  0.5 mg Intravenous Once   insulin aspart  0-15 Units Subcutaneous  TID WC   pravastatin  20 mg Oral q1800   sevelamer carbonate  3,200 mg Oral TID WC   sodium chloride flush  3-10 mL Intravenous Q12H   sodium zirconium cyclosilicate  10 g Oral TID    Dialysis Orders:  [facility] TTS 4h   106.5kg   2K bath  B450   AVF   Heparin 7000 -Mircera 225 q 2 wks (last 3/20) -Hectorol 3  HD     Assessment/Plan: Scrotal abscess - urology following, s/p I&D at bedside. Cultures pending. On Vanc, Zosyn, clindamycin.  ESRD - on HD TTS. Did not get HD Sat, High inpatient census. HD today (Sunday) HTN - BP controlled. Continue home meds  Volume - no gross overload. UF to dry wt. Challenge EDW as able.  Anemia - ESRD + ABLA from abscess. S/p 2 units prbcs on 3/22. On ESA outpatient, recently dosed.  Secondary hyperparathyroidism: Continue Hectorol. Renvela binder  DM - per primary     Tomasa Blase PA-C [facility] 11/23/2023,11:49 AM

## 2023-11-23 NOTE — Progress Notes (Signed)
 PT Cancellation Note  Patient Details Name: ALVESTER EADS MRN: [id number] DOB: [birth year]   Cancelled Treatment:    Reason Eval/Treat Not Completed: Pain limiting ability to participate (Per RN, pt about to receive dressing change and is in too much pain to participate today. Politely requests PT to follow up tomorrow.)   Gladys Damme 11/23/2023, 2:33 PM

## 2023-11-23 NOTE — Plan of Care (Signed)
  Problem: Education: Goal: Knowledge of General Education information will improve Description: Including pain rating scale, medication(s)/side effects and non-pharmacologic comfort measures Outcome: Progressing   Problem: Health Behavior/Discharge Planning: Goal: Ability to manage health-related needs will improve Outcome: Progressing   Problem: Clinical Measurements: Goal: Ability to maintain clinical measurements within normal limits will improve Outcome: Progressing   Problem: Activity: Goal: Risk for activity intolerance will decrease Outcome: Progressing   Problem: Coping: Goal: Level of anxiety will decrease Outcome: Progressing   Problem: Elimination: Goal: Will not experience complications related to bowel motility Outcome: Progressing   Problem: Pain Managment: Goal: General experience of comfort will improve and/or be controlled Outcome: Progressing   Problem: Safety: Goal: Ability to remain free from injury will improve Outcome: Progressing

## 2023-11-23 NOTE — Progress Notes (Incomplete)
  [facility] Progress Note   Subjective:     Objective Vitals:   11/22/23 1600 11/22/23 2000 11/23/23 0020 11/23/23 0342  BP: 125/73 124/80 (!) 117/90 133/83  Pulse: 73 75 72 74  Resp: 16 18 15 18   Temp: 97.6 F (36.4 C) 97.8 F (36.6 C) 97.9 F (36.6 C) 98.2 F (36.8 C)  TempSrc: Oral Oral Oral Oral  SpO2: 92% 99% 99% 95%  Weight:      Height:         Additional Objective Labs: Basic Metabolic Panel: Recent Labs  Lab 11/21/23 0837 11/22/23 0717 11/23/23 0538  NA 135 133* 134*  K 4.4 4.7 5.4*  CL 94* 97* 95*  CO2 28 23 25   GLUCOSE 121* 97 97  BUN 42* 47* 53*  CREATININE 10.90* 12.42* 13.69*  CALCIUM 8.9 7.8* 8.0*  PHOS  --   --  7.0*   CBC: Recent Labs  Lab 11/21/23 0837 11/22/23 0717 11/22/23 1954 11/23/23 0538  WBC 11.9* 9.6 10.9* 9.9  NEUTROABS 9.8* 7.1  --  6.9  HGB 8.4* 6.5* 7.8* 8.2*  HCT 26.1* 20.6* 23.7* 24.8*  MCV 91.3 92.8 90.8 90.8  PLT 159 163 168 184   Blood Culture    Component Value Date/Time   SDES SCROTUM ABSCESS 11/21/2023 1530   SPECREQUEST NONE 11/21/2023 1530   CULT  11/21/2023 1530    NO GROWTH < 24 HOURS Performed at [facility], [address], [place], [place] [address]    REPTSTATUS PENDING 11/21/2023 1530    Physical Exam General: Alert, nad Heart: RRR Lungs: Clear, normal wob Abdomen: soft non-tender GU: deferred  Extremities: No LE edema  Dialysis Access: LUE AVF +bruit   Medications:  clindamycin (CLEOCIN) IV 600 mg (11/23/23 0513)   piperacillin-tazobactam (ZOSYN)  IV 2.25 g (11/23/23 0512)   vancomycin      carvedilol  25 mg Oral BID WC   Chlorhexidine Gluconate Cloth  6 each Topical Q0600   [START ON 11/24/2023] heparin  5,000 Units Subcutaneous Q8H    HYDROmorphone (DILAUDID) injection  0.5 mg Intravenous Once   insulin aspart  0-15 Units Subcutaneous TID WC   pravastatin  20 mg Oral q1800   sevelamer carbonate  3,200 mg Oral TID WC   sodium chloride flush  3-10 mL  Intravenous Q12H    Dialysis Orders:  [facility] TTS 4h   106.5kg   2K bath  B450   AVF   Heparin 7000 -Mircera 225 q 2 wks (last 3/20) -Hectorol 3  HD     Assessment/Plan: Scrotal abscess - urology following, s/p I&D at bedside. On IV Zosyn, clindamycin.  ESRD - on HD TTS. HD today  HTN - BP controlled. Continue home meds  Volume - no vol excess. UF to dry wt  Anemia - ESRD + ABLA from abscess. Hgb dropping. 2 units prbcs today. On ESA outpatient, recently dosed.  Secondary hyperparathyroidism: Continue Hectorol. Renvela binder     Tomasa Blase PA-C [facility] 11/23/2023,9:09 AM

## 2023-11-23 NOTE — Progress Notes (Signed)
 PROGRESS NOTE                                                                                                                                                                                                             Patient Demographics:    Quin Mathenia, is a 43 y.o. male, DOB - September 28, 1980, ZOX:[id number]  Outpatient Primary MD for the patient is Marcine Matar, MD    LOS - 2  Admit date - 11/21/2023    Chief Complaint  Patient presents with   Testicle Pain       Brief Narrative (HPI from H&P)   43 y.o. male with past medical history  of end-stage renal disease on hemodialysis makes minimal urine, untreated diabetes mellitus type 2 coming from [facility] for scrotal pain with suspected  Fournier's gangrene beginnings.  Per report patient's symptoms have been going on for the past 2 days and progressively worsening.  No reports of fevers chills nausea vomiting diarrhea bleeding urinary complaints.  Patient does have testicular pain with no relief of symptoms and worse with walking.  Patient report sore with foul smelling discharge.    Subjective:   Patient in bed, appears comfortable, denies any headache, no fever, no chest pain or pressure, no shortness of breath , no abdominal pain. No new focal weakness.   Assessment  & Plan :    Scrotal abscess.  Seen by urology, underwent incision and drainage on 11/21/2023, follow cultures, continue empiric IV antibiotics for now which include vancomycin, clindamycin and Zosyn.  Will monitor cultures and adjust.  Appreciate urology input.  Ascites  RUQ USG is nonacute.  Fluid overload likely due to underlying ESRD  Anemia of chronic disease due to underlying ESRD along with perioperative blood loss anemia.  He received 2 units of packed RBC on 11/22/2023, conveyed to urology and nephrology.  CBC stable posttransfusion continue to monitor.  Essential hypertension  Blood pressure is  stable.  Home regimen includes amlodipine, Coreg 25 twice daily, hydralazine 25.   ESRD on dialysis [facility])  HD T/TH/sat and Nephrology consulted, Lokelma on 11/23/2023 for hyperkalemia.  Diabetes mellitus (HCC)  Monitor patient's Accu-Cheks  3 times daily before every meal.  Glycemic protocol.  CBG (last 3)  Recent Labs    11/22/23 1226 11/22/23 1630 11/22/23 2117  GLUCAP 102* 93 107*  Lab Results  Component Value Date   HGBA1C 5.3 11/21/2023         Condition -   Guarded  Family Communication  : Family member bedside on 11/22/2023  Code Status : Full code  Consults  : Urology, nephrology  PUD Prophylaxis :     Procedures  :     CT - 1. Positive for a 4.5 cm Scrotal Abscess. Posteroinferior scrotal wall affected. Underlying generalized perineum and body wall edema. But no soft tissue gas to strongly suggest a necrotizing infection at this time. Still, recommend Urology consultation. And note also a right scrotal hydrocele. 2. Moderate volume Ascites in the lower abdomen and pelvis, substantially progressed since a 2018 CT and with some complex fluid density. Etiology is unclear. 3. Diffuse severe calcified atherosclerosis. Aortic Atherosclerosis (ICD10-I70.0).   Scrotal incision and drainage by urology on 11/21/2023      Disposition Plan  :    Status is: Inpatient   DVT Prophylaxis  :    heparin injection 5,000 Units Start: 11/24/23 0600 Place and maintain sequential compression device Start: 11/22/23 1128    Lab Results  Component Value Date   PLT 184 11/23/2023    Diet :  Diet Order             Diet heart healthy/carb modified Room service appropriate? Yes; Fluid consistency: Thin  Diet effective now                    Inpatient Medications  Scheduled Meds:  carvedilol  25 mg Oral BID WC   Chlorhexidine Gluconate Cloth  6 each Topical Q0600   [START ON 11/24/2023] heparin  5,000 Units Subcutaneous Q8H    HYDROmorphone (DILAUDID) injection  0.5  mg Intravenous Once   insulin aspart  0-15 Units Subcutaneous TID WC   pravastatin  20 mg Oral q1800   sevelamer carbonate  3,200 mg Oral TID WC   sodium chloride flush  3-10 mL Intravenous Q12H   sodium zirconium cyclosilicate  10 g Oral TID   Continuous Infusions:  clindamycin (CLEOCIN) IV 600 mg (11/23/23 0513)   piperacillin-tazobactam (ZOSYN)  IV 2.25 g (11/23/23 0512)   vancomycin     PRN Meds:.acetaminophen **OR** acetaminophen, hydrALAZINE, morphine injection, nicotine, ondansetron **OR** ondansetron (ZOFRAN) IV, sodium chloride flush    Objective:   Vitals:   11/22/23 1600 11/22/23 2000 11/23/23 0020 11/23/23 0342  BP: 125/73 124/80 (!) 117/90 133/83  Pulse: 73 75 72 74  Resp: 16 18 15 18   Temp: 97.6 F (36.4 C) 97.8 F (36.6 C) 97.9 F (36.6 C) 98.2 F (36.8 C)  TempSrc: Oral Oral Oral Oral  SpO2: 92% 99% 99% 95%  Weight:      Height:        Wt Readings from Last 3 Encounters:  11/21/23 107 kg  03/27/20 (!) 106.2 kg  02/16/20 95.3 kg     Intake/Output Summary (Last 24 hours) at 11/23/2023 0941 Last data filed at 11/22/2023 2100 Gross per 24 hour  Intake 1003 ml  Output --  Net 1003 ml     Physical Exam  Awake Alert, No new F.N deficits, Normal affect [place].AT,PERRAL Supple Neck, No JVD,   Symmetrical Chest wall movement, Good air movement bilaterally, CTAB RRR,No Gallops,Rubs or new Murmurs,  +ve B.Sounds, Abd Soft, No tenderness,   No edema, scrotum under bandage soaked with blood   Data Review:    Recent Labs  Lab 11/21/23 0837 11/22/23 0717 11/22/23 1954  11/23/23 0538  WBC 11.9* 9.6 10.9* 9.9  HGB 8.4* 6.5* 7.8* 8.2*  HCT 26.1* 20.6* 23.7* 24.8*  PLT 159 163 168 184  MCV 91.3 92.8 90.8 90.8  MCH 29.4 29.3 29.9 30.0  MCHC 32.2 31.6 32.9 33.1  RDW 17.9* 17.9* 16.8* 16.8*  LYMPHSABS 1.1 1.4  --  1.5  MONOABS 1.0 1.0  --  1.1*  EOSABS 0.0 0.1  --  0.2  BASOSABS 0.0 0.0  --  0.0    Recent Labs  Lab 11/21/23 0837 11/21/23 1036  11/21/23 1433 11/22/23 0717 11/23/23 0538  NA 135  --   --  133* 134*  K 4.4  --   --  4.7 5.4*  CL 94*  --   --  97* 95*  CO2 28  --   --  23 25  ANIONGAP 13  --   --  13 14  GLUCOSE 121*  --   --  97 97  BUN 42*  --   --  47* 53*  CREATININE 10.90*  --   --  12.42* 13.69*  AST 14*  --   --   --   --   ALT 9  --   --   --   --   ALKPHOS 63  --   --   --   --   BILITOT 1.1  --   --   --   --   ALBUMIN 3.8  --   --   --  2.5*  CRP  --   --   --  11.9* 9.7*  PROCALCITON  --   --   --  3.16 2.84  LATICACIDVEN 1.0 0.8  --   --   --   INR  --   --   --  1.6*  --   HGBA1C  --   --  5.3  --   --   MG  --   --   --   --  1.9  PHOS  --   --   --   --  7.0*  CALCIUM 8.9  --   --  7.8* 8.0*      Recent Labs  Lab 11/21/23 0837 11/21/23 1036 11/21/23 1433 11/22/23 0717 11/23/23 0538  CRP  --   --   --  11.9* 9.7*  PROCALCITON  --   --   --  3.16 2.84  LATICACIDVEN 1.0 0.8  --   --   --   INR  --   --   --  1.6*  --   HGBA1C  --   --  5.3  --   --   MG  --   --   --   --  1.9  CALCIUM 8.9  --   --  7.8* 8.0*    --------------------------------------------------------------------------------------------------------------- Lab Results  Component Value Date   CHOL 232 (H) 06/10/2019   HDL 54 06/10/2019   LDLCALC 136 (H) 06/10/2019   TRIG 204 (H) 12/02/2019   CHOLHDL 4.3 06/10/2019    Lab Results  Component Value Date   HGBA1C 5.3 11/21/2023   No results for input(s): "TSH", "T4TOTAL", "FREET4", "T3FREE", "THYROIDAB" in the last 72 hours. No results for input(s): "VITAMINB12", "FOLATE", "FERRITIN", "TIBC", "IRON", "RETICCTPCT" in the last 72 hours. ------------------------------------------------------------------------------------------------------------------ Cardiac Enzymes No results for input(s): "CKMB", "TROPONINI", "MYOGLOBIN" in the last 168 hours.  Invalid input(s): "CK"  Micro Results Recent Results (from the past 240 hours)  Blood culture (  routine x 2)      Status: None (Preliminary result)   Collection Time: 11/21/23  8:30 AM   Specimen: BLOOD  Result Value Ref Range Status   Specimen Description   Final    BLOOD RIGHT ANTECUBITAL Performed at [facility], [address] [place], [place] [address]    Special Requests   Final    Blood Culture adequate volume BOTTLES DRAWN AEROBIC AND ANAEROBIC Performed at [facility], [address] [place], [place] [address]    Culture   Final    NO GROWTH 2 DAYS Performed at [facility], [address] [place], [place] [address]    Report Status PENDING  Incomplete  Blood culture (routine x 2)     Status: None (Preliminary result)   Collection Time: 11/21/23  8:50 AM   Specimen: BLOOD RIGHT FOREARM  Result Value Ref Range Status   Specimen Description   Final    BLOOD RIGHT FOREARM Performed at [facility], [address] [place], [place] [address]    Special Requests   Final    BOTTLES DRAWN AEROBIC AND ANAEROBIC Blood Culture adequate volume Performed at [facility], [address] [place], [place] [address]    Culture   Final    NO GROWTH 2 DAYS Performed at [facility], [address] [place], [place] [address]    Report Status PENDING  Incomplete  MRSA Next Gen by PCR, Nasal     Status: None   Collection Time: 11/21/23  1:55 PM   Specimen: Nasal Mucosa; Nasal Swab  Result Value Ref Range Status   MRSA by PCR Next Gen NOT DETECTED NOT DETECTED Final    Comment: (NOTE) The GeneXpert MRSA Assay (FDA approved for NASAL specimens only), is one component of a comprehensive MRSA colonization surveillance program. It is not intended to diagnose MRSA infection nor to guide or monitor treatment for MRSA infections. Test performance is not FDA approved in patients less than 22 years old. Performed at [facility], [address] [place], [place] [address]   Aerobic Culture w Gram  Stain (superficial specimen)     Status: None (Preliminary result)   Collection Time: 11/21/23  3:30 PM   Specimen: Scrotum; Abscess  Result Value Ref Range Status   Specimen Description SCROTUM ABSCESS  Final   Special Requests NONE  Final   Gram Stain   Final    ABUNDANT WBC PRESENT, PREDOMINANTLY PMN FEW GRAM POSITIVE COCCI RARE GRAM NEGATIVE RODS    Culture   Final    NO GROWTH < 24 HOURS Performed at [facility], [address], [place], [place] [address]    Report Status PENDING  Incomplete    Radiology Report ECHOCARDIOGRAM COMPLETE Result Date: 11/22/2023    ECHOCARDIOGRAM REPORT   Patient Name:   TYMEER VAQUERA Date of Exam: 11/22/2023 Medical Rec #:  [id number]     Height:       73.0 in Accession #:    [id number]    Weight:       235.9 lb Date of Birth:  06-18-1981     BSA:          2.308 m Patient Age:    42 years      BP:           125/84 mmHg Patient Gender: M             HR:  74 bpm. Exam Location:  Inpatient Procedure: 2D Echo, Cardiac Doppler and Color Doppler (Both Spectral and Color            Flow Doppler were utilized during procedure). Indications:    Murmur  History:        Patient has prior history of Echocardiogram examinations. Risk                 Factors:Hypertension and Diabetes.  Sonographer:    Lamont Snowball Referring Phys: [phone] EKTA V PATEL IMPRESSIONS  1. Left ventricular ejection fraction, by estimation, is 55 to 60%. The left ventricle has normal function. The left ventricle has no regional wall motion abnormalities. There is mild left ventricular hypertrophy. Left ventricular diastolic parameters were normal.  2. Right ventricular systolic function is normal. The right ventricular size is normal. Tricuspid regurgitation signal is inadequate for assessing PA pressure.  3. The mitral valve is degenerative. Trivial mitral valve regurgitation. No evidence of mitral stenosis.  4. The aortic valve is tricuspid. Aortic valve regurgitation is not  visualized. No aortic stenosis is present.  5. The inferior vena cava is dilated in size with <50% respiratory variability, suggesting right atrial pressure of 15 mmHg. FINDINGS  Left Ventricle: Left ventricular ejection fraction, by estimation, is 55 to 60%. The left ventricle has normal function. The left ventricle has no regional wall motion abnormalities. The left ventricular internal cavity size was normal in size. There is  mild left ventricular hypertrophy. Left ventricular diastolic parameters were normal. Right Ventricle: The right ventricular size is normal. No increase in right ventricular wall thickness. Right ventricular systolic function is normal. Tricuspid regurgitation signal is inadequate for assessing PA pressure. Left Atrium: Left atrial size was normal in size. Right Atrium: Right atrial size was normal in size. Pericardium: There is no evidence of pericardial effusion. Mitral Valve: The mitral valve is degenerative in appearance. Mild mitral annular calcification. Trivial mitral valve regurgitation. No evidence of mitral valve stenosis. MV peak gradient, 5.8 mmHg. The mean mitral valve gradient is 3.0 mmHg. Tricuspid Valve: The tricuspid valve is normal in structure. Tricuspid valve regurgitation is trivial. No evidence of tricuspid stenosis. Aortic Valve: The aortic valve is tricuspid. Aortic valve regurgitation is not visualized. No aortic stenosis is present. Aortic valve peak gradient measures 14.9 mmHg. Pulmonic Valve: The pulmonic valve was normal in structure. Pulmonic valve regurgitation is not visualized. No evidence of pulmonic stenosis. Aorta: The aortic root and ascending aorta are structurally normal, with no evidence of dilitation. Venous: The inferior vena cava is dilated in size with less than 50% respiratory variability, suggesting right atrial pressure of 15 mmHg. IAS/Shunts: The interatrial septum was not well visualized.  LEFT VENTRICLE PLAX 2D LVIDd:         5.00 cm    Diastology LVIDs:         3.40 cm   LV e' medial:    7.18 cm/s LV PW:         1.20 cm   LV E/e' medial:  15.3 LV IVS:        1.20 cm   LV e' lateral:   8.59 cm/s LVOT diam:     2.00 cm   LV E/e' lateral: 12.8 LV SV:         84 LV SV Index:   36 LVOT Area:     3.14 cm  RIGHT VENTRICLE             IVC RV Basal diam:  4.70 cm     IVC diam: 2.60 cm RV S prime:     10.80 cm/s TAPSE (M-mode): 2.0 cm LEFT ATRIUM             Index        RIGHT ATRIUM           Index LA diam:        5.00 cm 2.17 cm/m   RA Area:     20.70 cm LA Vol (A2C):   78.3 ml 33.93 ml/m  RA Volume:   63.20 ml  27.38 ml/m LA Vol (A4C):   77.6 ml 33.62 ml/m LA Biplane Vol: 78.2 ml 33.88 ml/m  AORTIC VALVE AV Area (Vmax): 2.16 cm AV Vmax:        193.00 cm/s AV Peak Grad:   14.9 mmHg LVOT Vmax:      133.00 cm/s LVOT Vmean:     102.000 cm/s LVOT VTI:       0.268 m  AORTA Ao Root diam: 3.10 cm Ao Asc diam:  3.40 cm MITRAL VALVE MV Area (PHT): 3.02 cm     SHUNTS MV Area VTI:   2.73 cm     Systemic VTI:  0.27 m MV Peak grad:  5.8 mmHg     Systemic Diam: 2.00 cm MV Mean grad:  3.0 mmHg MV Vmax:       1.20 m/s MV Vmean:      78.2 cm/s MV Decel Time: 251 msec MV E velocity: 110.00 cm/s MV A velocity: 63.90 cm/s MV E/A ratio:  1.72 Sunit Tolia Electronically signed by Tessa Lerner Signature Date/Time: 11/22/2023/3:38:55 PM    Final    US Abdomen Limited RUQ (LIVER/GB) Result Date: 11/22/2023 CLINICAL DATA:  Increased ascites EXAM: ULTRASOUND ABDOMEN LIMITED RIGHT UPPER QUADRANT COMPARISON:  CT pelvis dated 11/21/2023 FINDINGS: Gallbladder: No gallstones or wall thickening visualized. No sonographic Murphy sign noted by sonographer. Common bile duct: Diameter: 5 mm Liver: No focal lesion identified. Within normal limits in parenchymal echogenicity. Portal vein is patent on color Doppler imaging with normal direction of blood flow towards the liver. Other: Moderate volume ascites. Right kidney is diffusely echogenic and appears atrophic. IMPRESSION: 1.  Normal sonographic appearance of the liver. 2. Moderate volume ascites. 3. Diffusely echogenic and atrophic right kidney, likely sequela of chronic medical renal disease. Electronically Signed   By: Agustin Cree M.D.   On: 11/22/2023 08:52   CT PELVIS W CONTRAST Result Date: 11/21/2023 CLINICAL DATA:  43 year old male with soft tissue infection suspected, query Fournier's gangrene. End stage renal disease, complicated diabetes, and foul smelling purulence from lower scrotum. EXAM: CT PELVIS WITH CONTRAST TECHNIQUE: Multidetector CT imaging of the pelvis was performed using the standard protocol following the bolus administration of intravenous contrast. RADIATION DOSE REDUCTION: This exam was performed according to the departmental dose-optimization program which includes automated exposure control, adjustment of the mA and/or kV according to patient size and/or use of iterative reconstruction technique. CONTRAST:  OMNIPAQUE IOHEXOL 300 MG/ML  SOLN COMPARISON:  Noncontrast CT Abdomen and Pelvis 04/03/2017. FINDINGS: Urinary Tract: Decompressed urinary bladder. Kidneys not included. No evidence of hydroureter. Bowel: Nondilated visible large and small bowel loops. Moderate to large volume of ascites in the peritoneal cavity, with simple fluid density at the level of the abdominal gutters (series 4, image 10), but more complex fluid density in the pelvis (series 4, image 27. No layering hematocrit level. No pneumoperitoneum identified. Vascular/Lymphatic: Diffuse calcified atherosclerosis throughout the lower abdomen and  pelvis. Visible major arterial structures appear to be patent and enhancing. Normal caliber distal aorta. No lymphadenopathy identified. Reproductive: There is a degree of generalized body wall edema including in the perineum (series 4, image 57) but there is no perineal or other soft tissue space gas identified. However, positive for irregular rim enhancing abscess of the scrotal wall along the  posterior, caudal extent seen on series 4, image 65. This encompasses an area of about 4.5 cm and is well demonstrated on coronal image 42. Superimposed generalized scrotal wall edema. No scrotal wall gas. Superimposed right side hydrocele which is moderate and with fairly simple fluid density (series 4, image 67). Other:  Inferior liver tip is visible, not overtly nodular. Musculoskeletal: No acute or suspicious osseous lesion identified. IMPRESSION: 1. Positive for a 4.5 cm Scrotal Abscess. Posteroinferior scrotal wall affected. Underlying generalized perineum and body wall edema. But no soft tissue gas to strongly suggest a necrotizing infection at this time. Still, recommend Urology consultation. And note also a right scrotal hydrocele. 2. Moderate volume Ascites in the lower abdomen and pelvis, substantially progressed since a 2018 CT and with some complex fluid density. Etiology is unclear. 3. Diffuse severe calcified atherosclerosis. Aortic Atherosclerosis (ICD10-I70.0). Study discussed by telephone with Dr. Lynden Oxford on 11/21/2023 at 1001 hours. Electronically Signed   By: Odessa Fleming M.D.   On: 11/21/2023 10:15     Signature  -   Susa Raring M.D on 11/23/2023 at 9:41 AM   -  To page go to [email or web address]

## 2023-11-23 NOTE — Progress Notes (Signed)
 Subjective: Bleeding stopped following wound packing with Surgicel.  H/H stable.  Patient with no complaints this morning  Objective: Vital signs in last 24 hours: Temp:  [97.6 F (36.4 C)-98.2 F (36.8 C)] 98 F (36.7 C) (03/23 0800) Pulse Rate:  [72-75] 74 (03/23 0800) Resp:  [15-20] 20 (03/23 0800) BP: (109-133)/(73-90) 132/87 (03/23 0800) SpO2:  [91 %-99 %] 91 % (03/23 0800)  Intake/Output from previous day: 03/22 0701 - 03/23 0700 In: 1003 [I.V.:3; Blood:1000] Out: -   Intake/Output this shift: No intake/output data recorded.  Physical Exam:  General: Alert and oriented GU: Scrotal wound is packed with clear skin margins with no obvious bleeding.  Small amount of purulent drainage seen in surrounding dressings.  Lab Results: Recent Labs    11/22/23 0717 11/22/23 1954 11/23/23 0538  HGB 6.5* 7.8* 8.2*  HCT 20.6* 23.7* 24.8*   BMET Recent Labs    11/22/23 0717 11/23/23 0538  NA 133* 134*  K 4.7 5.4*  CL 97* 95*  CO2 23 25  GLUCOSE 97 97  BUN 47* 53*  CREATININE 12.42* 13.69*  CALCIUM 7.8* 8.0*     Studies/Results: ECHOCARDIOGRAM COMPLETE Result Date: 11/22/2023    ECHOCARDIOGRAM REPORT   Patient Name:   Patrick Ibarra Date of Exam: 11/22/2023 Medical Rec #:  [id number]     Height:       73.0 in Accession #:    [id number]    Weight:       235.9 lb Date of Birth:  February 14, 1981     BSA:          2.308 m Patient Age:    42 years      BP:           125/84 mmHg Patient Gender: M             HR:           74 bpm. Exam Location:  Inpatient Procedure: 2D Echo, Cardiac Doppler and Color Doppler (Both Spectral and Color            Flow Doppler were utilized during procedure). Indications:    Murmur  History:        Patient has prior history of Echocardiogram examinations. Risk                 Factors:Hypertension and Diabetes.  Sonographer:    Lamont Snowball Referring Phys: [phone] EKTA V PATEL IMPRESSIONS  1. Left ventricular ejection fraction, by estimation, is 55 to 60%.  The left ventricle has normal function. The left ventricle has no regional wall motion abnormalities. There is mild left ventricular hypertrophy. Left ventricular diastolic parameters were normal.  2. Right ventricular systolic function is normal. The right ventricular size is normal. Tricuspid regurgitation signal is inadequate for assessing PA pressure.  3. The mitral valve is degenerative. Trivial mitral valve regurgitation. No evidence of mitral stenosis.  4. The aortic valve is tricuspid. Aortic valve regurgitation is not visualized. No aortic stenosis is present.  5. The inferior vena cava is dilated in size with <50% respiratory variability, suggesting right atrial pressure of 15 mmHg. FINDINGS  Left Ventricle: Left ventricular ejection fraction, by estimation, is 55 to 60%. The left ventricle has normal function. The left ventricle has no regional wall motion abnormalities. The left ventricular internal cavity size was normal in size. There is  mild left ventricular hypertrophy. Left ventricular diastolic parameters were normal. Right Ventricle: The right ventricular size is normal. No increase in right  ventricular wall thickness. Right ventricular systolic function is normal. Tricuspid regurgitation signal is inadequate for assessing PA pressure. Left Atrium: Left atrial size was normal in size. Right Atrium: Right atrial size was normal in size. Pericardium: There is no evidence of pericardial effusion. Mitral Valve: The mitral valve is degenerative in appearance. Mild mitral annular calcification. Trivial mitral valve regurgitation. No evidence of mitral valve stenosis. MV peak gradient, 5.8 mmHg. The mean mitral valve gradient is 3.0 mmHg. Tricuspid Valve: The tricuspid valve is normal in structure. Tricuspid valve regurgitation is trivial. No evidence of tricuspid stenosis. Aortic Valve: The aortic valve is tricuspid. Aortic valve regurgitation is not visualized. No aortic stenosis is present. Aortic  valve peak gradient measures 14.9 mmHg. Pulmonic Valve: The pulmonic valve was normal in structure. Pulmonic valve regurgitation is not visualized. No evidence of pulmonic stenosis. Aorta: The aortic root and ascending aorta are structurally normal, with no evidence of dilitation. Venous: The inferior vena cava is dilated in size with less than 50% respiratory variability, suggesting right atrial pressure of 15 mmHg. IAS/Shunts: The interatrial septum was not well visualized.  LEFT VENTRICLE PLAX 2D LVIDd:         5.00 cm   Diastology LVIDs:         3.40 cm   LV e' medial:    7.18 cm/s LV PW:         1.20 cm   LV E/e' medial:  15.3 LV IVS:        1.20 cm   LV e' lateral:   8.59 cm/s LVOT diam:     2.00 cm   LV E/e' lateral: 12.8 LV SV:         84 LV SV Index:   36 LVOT Area:     3.14 cm  RIGHT VENTRICLE             IVC RV Basal diam:  4.70 cm     IVC diam: 2.60 cm RV S prime:     10.80 cm/s TAPSE (M-mode): 2.0 cm LEFT ATRIUM             Index        RIGHT ATRIUM           Index LA diam:        5.00 cm 2.17 cm/m   RA Area:     20.70 cm LA Vol (A2C):   78.3 ml 33.93 ml/m  RA Volume:   63.20 ml  27.38 ml/m LA Vol (A4C):   77.6 ml 33.62 ml/m LA Biplane Vol: 78.2 ml 33.88 ml/m  AORTIC VALVE AV Area (Vmax): 2.16 cm AV Vmax:        193.00 cm/s AV Peak Grad:   14.9 mmHg LVOT Vmax:      133.00 cm/s LVOT Vmean:     102.000 cm/s LVOT VTI:       0.268 m  AORTA Ao Root diam: 3.10 cm Ao Asc diam:  3.40 cm MITRAL VALVE MV Area (PHT): 3.02 cm     SHUNTS MV Area VTI:   2.73 cm     Systemic VTI:  0.27 m MV Peak grad:  5.8 mmHg     Systemic Diam: 2.00 cm MV Mean grad:  3.0 mmHg MV Vmax:       1.20 m/s MV Vmean:      78.2 cm/s MV Decel Time: 251 msec MV E velocity: 110.00 cm/s MV A velocity: 63.90 cm/s MV E/A ratio:  1.72 Sunit Product manager  signed by Tessa Lerner Signature Date/Time: 11/22/2023/3:38:55 PM    Final    US Abdomen Limited RUQ (LIVER/GB) Result Date: 11/22/2023 CLINICAL DATA:  Increased ascites EXAM:  ULTRASOUND ABDOMEN LIMITED RIGHT UPPER QUADRANT COMPARISON:  CT pelvis dated 11/21/2023 FINDINGS: Gallbladder: No gallstones or wall thickening visualized. No sonographic Murphy sign noted by sonographer. Common bile duct: Diameter: 5 mm Liver: No focal lesion identified. Within normal limits in parenchymal echogenicity. Portal vein is patent on color Doppler imaging with normal direction of blood flow towards the liver. Other: Moderate volume ascites. Right kidney is diffusely echogenic and appears atrophic. IMPRESSION: 1. Normal sonographic appearance of the liver. 2. Moderate volume ascites. 3. Diffusely echogenic and atrophic right kidney, likely sequela of chronic medical renal disease. Electronically Signed   By: Agustin Cree M.D.   On: 11/22/2023 08:52    Assessment/Plan: 44 year old male with a 4 cm scrotal abscess, s/p bedside I&D on 11/21/2023   -Change scrotal wound as needed for purulent output/bleeding.  Contact MD if bleeding recurs. -Wound cultures are inconclusive.  Recommend total of 2 weeks of cephalexin, Cipro or Bactrim for antibiotic coverage -Will arrange outpatient follow-up for wound check   LOS: 2 days   Rhoderick Moody, MD [facility] Pager: [phone]  11/23/2023, 11:42 AM

## 2023-11-24 DIAGNOSIS — N493 Fournier gangrene: Secondary | ICD-10-CM | POA: Diagnosis not present

## 2023-11-24 LAB — CBC WITH DIFFERENTIAL/PLATELET
Abs Immature Granulocytes: 0.04 10*3/uL (ref 0.00–0.07)
Basophils Absolute: 0 10*3/uL (ref 0.0–0.1)
Basophils Relative: 0 %
Eosinophils Absolute: 0.2 10*3/uL (ref 0.0–0.5)
Eosinophils Relative: 2 %
HCT: 22.1 % — ABNORMAL LOW (ref 39.0–52.0)
Hemoglobin: 7.2 g/dL — ABNORMAL LOW (ref 13.0–17.0)
Immature Granulocytes: 0 %
Lymphocytes Relative: 11 %
Lymphs Abs: 1 10*3/uL (ref 0.7–4.0)
MCH: 29.8 pg (ref 26.0–34.0)
MCHC: 32.6 g/dL (ref 30.0–36.0)
MCV: 91.3 fL (ref 80.0–100.0)
Monocytes Absolute: 0.9 10*3/uL (ref 0.1–1.0)
Monocytes Relative: 10 %
Neutro Abs: 7.1 10*3/uL (ref 1.7–7.7)
Neutrophils Relative %: 77 %
Platelets: 192 10*3/uL (ref 150–400)
RBC: 2.42 MIL/uL — ABNORMAL LOW (ref 4.22–5.81)
RDW: 16.7 % — ABNORMAL HIGH (ref 11.5–15.5)
WBC: 9.3 10*3/uL (ref 4.0–10.5)
nRBC: 0 % (ref 0.0–0.2)

## 2023-11-24 LAB — RENAL FUNCTION PANEL
Albumin: 2.5 g/dL — ABNORMAL LOW (ref 3.5–5.0)
Anion gap: 12 (ref 5–15)
BUN: 32 mg/dL — ABNORMAL HIGH (ref 6–20)
CO2: 25 mmol/L (ref 22–32)
Calcium: 8.2 mg/dL — ABNORMAL LOW (ref 8.9–10.3)
Chloride: 96 mmol/L — ABNORMAL LOW (ref 98–111)
Creatinine, Ser: 9.15 mg/dL — ABNORMAL HIGH (ref 0.61–1.24)
GFR, Estimated: 7 mL/min — ABNORMAL LOW (ref >60)
Glucose, Bld: 111 mg/dL — ABNORMAL HIGH (ref 70–99)
Phosphorus: 4.9 mg/dL — ABNORMAL HIGH (ref 2.5–4.6)
Potassium: 4 mmol/L (ref 3.5–5.1)
Sodium: 133 mmol/L — ABNORMAL LOW (ref 135–145)

## 2023-11-24 LAB — AEROBIC CULTURE W GRAM STAIN (SUPERFICIAL SPECIMEN)

## 2023-11-24 LAB — MAGNESIUM: Magnesium: 1.8 mg/dL (ref 1.7–2.4)

## 2023-11-24 LAB — GLUCOSE, CAPILLARY
Glucose-Capillary: 109 mg/dL — ABNORMAL HIGH (ref 70–99)
Glucose-Capillary: 100 mg/dL — ABNORMAL HIGH (ref 70–99)
Glucose-Capillary: 98 mg/dL (ref 70–99)
Glucose-Capillary: 111 mg/dL — ABNORMAL HIGH (ref 70–99)

## 2023-11-24 LAB — C-REACTIVE PROTEIN: CRP: 10.3 mg/dL — ABNORMAL HIGH (ref <1.0)

## 2023-11-24 LAB — PROCALCITONIN: Procalcitonin: 2.02 ng/mL

## 2023-11-24 MED ORDER — VANCOMYCIN HCL IN DEXTROSE 1-5 GM/200ML-% IV SOLN
1000.0000 mg | Freq: Once | INTRAVENOUS | Status: AC
  Administered 2023-11-24: 1000 mg via INTRAVENOUS
  Filled 2023-11-24: qty 200

## 2023-11-24 NOTE — Progress Notes (Signed)
 Scrotal dressing changed per order. Patient wife unavailable for teaching at this time.

## 2023-11-24 NOTE — Progress Notes (Signed)
 [facility] Progress Note   Subjective:    Seen and examined at bedside. Tolerated yesterday's HD with net UF 1.5L. No acute issues. Next HD 3/25.  Objective Vitals:   11/24/23 0404 11/24/23 0800 11/24/23 0824 11/24/23 1154  BP: (!) 140/87 107/72 116/78 129/81  Pulse: 95 85  78  Resp: (!) 21 18  12   Temp: 98.7 F (37.1 C)   (!) 97.4 F (36.3 C)  TempSrc: Oral   Oral  SpO2: 96% 91%  98%  Weight:      Height:       Physical Exam General: Alert, nad Heart: RRR Lungs: Clear, normal wob Abdomen: soft non-tender GU: deferred  Extremities: No LE edema  Dialysis Access: LUE AVF +bruit   Filed Weights   11/21/23 0827  Weight: 107 kg    Intake/Output Summary (Last 24 hours) at 11/24/2023 1227 Last data filed at 11/24/2023 0103 Gross per 24 hour  Intake 800 ml  Output 1500 ml  Net -700 ml    Additional Objective Labs: Basic Metabolic Panel: Recent Labs  Lab 11/22/23 0717 11/23/23 0538 11/24/23 0554  NA 133* 134* 133*  K 4.7 5.4* 4.0  CL 97* 95* 96*  CO2 23 25 25   GLUCOSE 97 97 111*  BUN 47* 53* 32*  CREATININE 12.42* 13.69* 9.15*  CALCIUM 7.8* 8.0* 8.2*  PHOS  --  7.0* 4.9*   Liver Function Tests: Recent Labs  Lab 11/21/23 0837 11/23/23 0538 11/24/23 0554  AST 14*  --   --   ALT 9  --   --   ALKPHOS 63  --   --   BILITOT 1.1  --   --   PROT 7.7  --   --   ALBUMIN 3.8 2.5* 2.5*   No results for input(s): "LIPASE", "AMYLASE" in the last 168 hours. CBC: Recent Labs  Lab 11/21/23 0837 11/22/23 0717 11/22/23 1954 11/23/23 0538 11/24/23 0554  WBC 11.9* 9.6 10.9* 9.9 9.3  NEUTROABS 9.8* 7.1  --  6.9 7.1  HGB 8.4* 6.5* 7.8* 8.2* 7.2*  HCT 26.1* 20.6* 23.7* 24.8* 22.1*  MCV 91.3 92.8 90.8 90.8 91.3  PLT 159 163 168 184 192   Blood Culture    Component Value Date/Time   SDES SCROTUM ABSCESS 11/21/2023 1530   SPECREQUEST NONE 11/21/2023 1530   CULT  11/21/2023 1530    RARE DIPHTHEROIDS(CORYNEBACTERIUM SPECIES) Standardized  susceptibility testing for this organism is not available. Performed at [facility], [address] [place], [place] [address]    REPTSTATUS 11/24/2023 FINAL 11/21/2023 1530    Cardiac Enzymes: No results for input(s): "CKTOTAL", "CKMB", "CKMBINDEX", "TROPONINI" in the last 168 hours. CBG: Recent Labs  Lab 11/22/23 2117 11/23/23 1248 11/23/23 1626 11/24/23 0825 11/24/23 1156  GLUCAP 107* 83 88 109* 100*   Iron Studies: No results for input(s): "IRON", "TIBC", "TRANSFERRIN", "FERRITIN" in the last 72 hours. Lab Results  Component Value Date   INR 1.6 (H) 11/22/2023   INR 1.3 (H) 12/02/2019   INR 1.1 10/01/2019   Studies/Results: ECHOCARDIOGRAM COMPLETE Result Date: 11/22/2023    ECHOCARDIOGRAM REPORT   Patient Name:   Patrick Ibarra Date of Exam: 11/22/2023 Medical Rec #:  [id number]     Height:       73.0 in Accession #:    [id number]    Weight:       235.9 lb Date of Birth:  1981/04/02     BSA:  2.308 m Patient Age:    42 years      BP:           125/84 mmHg Patient Gender: M             HR:           74 bpm. Exam Location:  Inpatient Procedure: 2D Echo, Cardiac Doppler and Color Doppler (Both Spectral and Color            Flow Doppler were utilized during procedure). Indications:    Murmur  History:        Patient has prior history of Echocardiogram examinations. Risk                 Factors:Hypertension and Diabetes.  Sonographer:    Lamont Snowball Referring Phys: [phone] EKTA V PATEL IMPRESSIONS  1. Left ventricular ejection fraction, by estimation, is 55 to 60%. The left ventricle has normal function. The left ventricle has no regional wall motion abnormalities. There is mild left ventricular hypertrophy. Left ventricular diastolic parameters were normal.  2. Right ventricular systolic function is normal. The right ventricular size is normal. Tricuspid regurgitation signal is inadequate for assessing PA pressure.  3. The mitral valve is degenerative. Trivial mitral valve  regurgitation. No evidence of mitral stenosis.  4. The aortic valve is tricuspid. Aortic valve regurgitation is not visualized. No aortic stenosis is present.  5. The inferior vena cava is dilated in size with <50% respiratory variability, suggesting right atrial pressure of 15 mmHg. FINDINGS  Left Ventricle: Left ventricular ejection fraction, by estimation, is 55 to 60%. The left ventricle has normal function. The left ventricle has no regional wall motion abnormalities. The left ventricular internal cavity size was normal in size. There is  mild left ventricular hypertrophy. Left ventricular diastolic parameters were normal. Right Ventricle: The right ventricular size is normal. No increase in right ventricular wall thickness. Right ventricular systolic function is normal. Tricuspid regurgitation signal is inadequate for assessing PA pressure. Left Atrium: Left atrial size was normal in size. Right Atrium: Right atrial size was normal in size. Pericardium: There is no evidence of pericardial effusion. Mitral Valve: The mitral valve is degenerative in appearance. Mild mitral annular calcification. Trivial mitral valve regurgitation. No evidence of mitral valve stenosis. MV peak gradient, 5.8 mmHg. The mean mitral valve gradient is 3.0 mmHg. Tricuspid Valve: The tricuspid valve is normal in structure. Tricuspid valve regurgitation is trivial. No evidence of tricuspid stenosis. Aortic Valve: The aortic valve is tricuspid. Aortic valve regurgitation is not visualized. No aortic stenosis is present. Aortic valve peak gradient measures 14.9 mmHg. Pulmonic Valve: The pulmonic valve was normal in structure. Pulmonic valve regurgitation is not visualized. No evidence of pulmonic stenosis. Aorta: The aortic root and ascending aorta are structurally normal, with no evidence of dilitation. Venous: The inferior vena cava is dilated in size with less than 50% respiratory variability, suggesting right atrial pressure of 15  mmHg. IAS/Shunts: The interatrial septum was not well visualized.  LEFT VENTRICLE PLAX 2D LVIDd:         5.00 cm   Diastology LVIDs:         3.40 cm   LV e' medial:    7.18 cm/s LV PW:         1.20 cm   LV E/e' medial:  15.3 LV IVS:        1.20 cm   LV e' lateral:   8.59 cm/s LVOT diam:  2.00 cm   LV E/e' lateral: 12.8 LV SV:         84 LV SV Index:   36 LVOT Area:     3.14 cm  RIGHT VENTRICLE             IVC RV Basal diam:  4.70 cm     IVC diam: 2.60 cm RV S prime:     10.80 cm/s TAPSE (M-mode): 2.0 cm LEFT ATRIUM             Index        RIGHT ATRIUM           Index LA diam:        5.00 cm 2.17 cm/m   RA Area:     20.70 cm LA Vol (A2C):   78.3 ml 33.93 ml/m  RA Volume:   63.20 ml  27.38 ml/m LA Vol (A4C):   77.6 ml 33.62 ml/m LA Biplane Vol: 78.2 ml 33.88 ml/m  AORTIC VALVE AV Area (Vmax): 2.16 cm AV Vmax:        193.00 cm/s AV Peak Grad:   14.9 mmHg LVOT Vmax:      133.00 cm/s LVOT Vmean:     102.000 cm/s LVOT VTI:       0.268 m  AORTA Ao Root diam: 3.10 cm Ao Asc diam:  3.40 cm MITRAL VALVE MV Area (PHT): 3.02 cm     SHUNTS MV Area VTI:   2.73 cm     Systemic VTI:  0.27 m MV Peak grad:  5.8 mmHg     Systemic Diam: 2.00 cm MV Mean grad:  3.0 mmHg MV Vmax:       1.20 m/s MV Vmean:      78.2 cm/s MV Decel Time: 251 msec MV E velocity: 110.00 cm/s MV A velocity: 63.90 cm/s MV E/A ratio:  1.72 Sunit Tolia Electronically signed by Tessa Lerner Signature Date/Time: 11/22/2023/3:38:55 PM    Final     Medications:  clindamycin (CLEOCIN) IV 600 mg (11/24/23 0539)   piperacillin-tazobactam (ZOSYN)  IV 2.25 g (11/24/23 1156)   vancomycin      carvedilol  25 mg Oral BID WC   Chlorhexidine Gluconate Cloth  6 each Topical Q0600   heparin  5,000 Units Subcutaneous Q8H    HYDROmorphone (DILAUDID) injection  0.5 mg Intravenous Once   insulin aspart  0-15 Units Subcutaneous TID WC   pravastatin  20 mg Oral q1800   sevelamer carbonate  3,200 mg Oral TID WC   sodium chloride flush  3-10 mL Intravenous Q12H     Dialysis Orders: [facility] TTS 4h   106.5kg   2K bath  B450   AVF   Heparin 7000 -Mircera 225 q 2 wks (last 3/20) -Hectorol 3  HD     Assessment/Plan: Scrotal abscess - urology following, s/p I&D at bedside. On IV Zosyn, clindamycin.  ESRD - on HD TTS. Received HD yesterday. Next HD 3/25.  HTN - BP controlled. Continue home meds  Volume - no vol excess. UF to dry wt  Anemia - ESRD + ABLA from abscess. Hgb dropping. 2 units prbcs today. On ESA outpatient, recently dosed.  Secondary hyperparathyroidism: Continue Hectorol. Renvela binder   Salome Holmes, NP [facility] 11/24/2023,12:27 PM  LOS: 3 days

## 2023-11-24 NOTE — Care Management Important Message (Signed)
 Important Message  Patient Details  Name: Patrick Ibarra MRN: [id number] Date of Birth: March 29, 1981   Important Message Given:        Dorena Bodo 11/24/2023, 3:07 PM

## 2023-11-24 NOTE — Progress Notes (Signed)
 PT Cancellation Note  Patient Details Name: Patrick Ibarra MRN: [id number] DOB: [birth year]   Cancelled Treatment:    Reason Eval/Treat Not Completed: Patient declined, no reason specified  Politely declines physical therapy services during this admission.States he has been independently mobilizing throughout room and completing ADLs without issue. Encouraged regular mobility during admission to lower risk of secondary complications associated with immobility. Declines any need for equipment as he feels close to baseline with much improvement in swelling per his report.  PT signing-off. Please re-order if there is a decline in functional status. Thank you!  Kathlyn Sacramento, PT, DPT [facility] Physical Therapist Office: [phone] Website: [email or web address]   Berton Mount 11/24/2023, 1:12 PM

## 2023-11-24 NOTE — Progress Notes (Signed)
 PROGRESS NOTE                                                                                                                                                                                                             Patient Demographics:    Patrick Ibarra, is a 43 y.o. male, DOB - 1981/06/06, ZOX:[id number]  Outpatient Primary MD for the patient is Marcine Matar, MD    LOS - 3  Admit date - 11/21/2023    Chief Complaint  Patient presents with   Testicle Pain       Brief Narrative (HPI from H&P)   43 y.o. male with past medical history  of end-stage renal disease on hemodialysis makes minimal urine, untreated diabetes mellitus type 2 coming from [facility] for scrotal pain with suspected  Fournier's gangrene beginnings.  Per report patient's symptoms have been going on for the past 2 days and progressively worsening.  No reports of fevers chills nausea vomiting diarrhea bleeding urinary complaints.  Patient does have testicular pain with no relief of symptoms and worse with walking.  Patient report sore with foul smelling discharge.    Subjective:   Patient in bed, appears comfortable, denies any headache, no fever, no chest pain or pressure, no shortness of breath , no abdominal pain. No focal weakness.   Assessment  & Plan :    Scrotal abscess.  Seen by urology, underwent incision and drainage on 11/21/2023, follow cultures, continue empiric IV antibiotics for now which include Vancomycin and Zosyn, stop clindamycin on 11/24/2023.  Will monitor cultures and adjust.  Appreciate urology input.  Ascites  RUQ USG is nonacute, stable echo.  Fluid overload likely due to underlying ESRD  Anemia of chronic disease due to underlying ESRD along with perioperative blood loss anemia.  He received 2 units of packed RBC on 11/22/2023, conveyed to urology and nephrology.  CBC stable posttransfusion continue to monitor.  Essential  hypertension  Blood pressure is stable.  Home regimen includes amlodipine, Coreg 25 twice daily, hydralazine 25.   ESRD on dialysis [facility])  HD T/TH/sat and Nephrology consulted, Lokelma on 11/23/2023 for hyperkalemia.  Diabetes mellitus (HCC)  Monitor patient's Accu-Cheks  3 times daily before every meal.  Glycemic protocol.  CBG (last 3)  Recent Labs    11/23/23 1248 11/23/23 1626 11/24/23 0825  GLUCAP 83  88 109*   Lab Results  Component Value Date   HGBA1C 5.3 11/21/2023         Condition -   Guarded  Family Communication  : Family member bedside on 11/22/2023  Code Status : Full code  Consults  : Urology, nephrology  PUD Prophylaxis :     Procedures  :     TTE 1. Left ventricular ejection fraction, by estimation, is 55 to 60%. The left ventricle has normal function. The left ventricle has no regional wall motion abnormalities. There is mild left ventricular hypertrophy. Left ventricular diastolic parameters were normal.  2. Right ventricular systolic function is normal. The right ventricular size is normal. Tricuspid regurgitation signal is inadequate for assessing PA pressure.  3. The mitral valve is degenerative. Trivial mitral valve regurgitation. No evidence of mitral stenosis.  4. The aortic valve is tricuspid. Aortic valve regurgitation is not visualized. No aortic stenosis is present.  5. The inferior vena cava is dilated in size with <50% respiratory variability, suggesting right atrial pressure of 15 mmHg  CT - 1. Positive for a 4.5 cm Scrotal Abscess. Posteroinferior scrotal wall affected. Underlying generalized perineum and body wall edema. But no soft tissue gas to strongly suggest a necrotizing infection at this time. Still, recommend Urology consultation. And note also a right scrotal hydrocele. 2. Moderate volume Ascites in the lower abdomen and pelvis, substantially progressed since a 2018 CT and with some complex fluid density. Etiology is unclear. 3. Diffuse  severe calcified atherosclerosis. Aortic Atherosclerosis (ICD10-I70.0).   Scrotal incision and drainage by urology on 11/21/2023      Disposition Plan  :    Status is: Inpatient   DVT Prophylaxis  :    heparin injection 5,000 Units Start: 11/24/23 0600 Place and maintain sequential compression device Start: 11/22/23 1128    Lab Results  Component Value Date   PLT 192 11/24/2023    Diet :  Diet Order             Diet heart healthy/carb modified Room service appropriate? Yes; Fluid consistency: Thin  Diet effective now                    Inpatient Medications  Scheduled Meds:  carvedilol  25 mg Oral BID WC   Chlorhexidine Gluconate Cloth  6 each Topical Q0600   heparin  5,000 Units Subcutaneous Q8H    HYDROmorphone (DILAUDID) injection  0.5 mg Intravenous Once   insulin aspart  0-15 Units Subcutaneous TID WC   pravastatin  20 mg Oral q1800   sevelamer carbonate  3,200 mg Oral TID WC   sodium chloride flush  3-10 mL Intravenous Q12H   Continuous Infusions:  clindamycin (CLEOCIN) IV 600 mg (11/24/23 0539)   piperacillin-tazobactam (ZOSYN)  IV 2.25 g (11/24/23 0538)   vancomycin     PRN Meds:.acetaminophen **OR** acetaminophen, hydrALAZINE, morphine injection, nicotine, ondansetron **OR** ondansetron (ZOFRAN) IV, sodium chloride flush    Objective:   Vitals:   11/24/23 0103 11/24/23 0404 11/24/23 0800 11/24/23 0824  BP: (!) 138/90 (!) 140/87 107/72 116/78  Pulse: 90 95 85   Resp: 13 (!) 21 18   Temp: 98.8 F (37.1 C) 98.7 F (37.1 C)    TempSrc: Oral Oral    SpO2: 100% 96% 91%   Weight:      Height:        Wt Readings from Last 3 Encounters:  11/21/23 107 kg  03/27/20 (!) 106.2 kg  02/16/20  95.3 kg     Intake/Output Summary (Last 24 hours) at 11/24/2023 1030 Last data filed at 11/24/2023 0103 Gross per 24 hour  Intake 800 ml  Output 1500 ml  Net -700 ml     Physical Exam  Awake Alert, No new F.N deficits, Normal  affect [place].AT,PERRAL Supple Neck, No JVD,   Symmetrical Chest wall movement, Good air movement bilaterally, CTAB RRR,No Gallops,Rubs or new Murmurs,  +ve B.Sounds, Abd Soft, No tenderness,   No edema, scrotum under bandage soaked with blood   Data Review:    Recent Labs  Lab 11/21/23 0837 11/22/23 0717 11/22/23 1954 11/23/23 0538 11/24/23 0554  WBC 11.9* 9.6 10.9* 9.9 9.3  HGB 8.4* 6.5* 7.8* 8.2* 7.2*  HCT 26.1* 20.6* 23.7* 24.8* 22.1*  PLT 159 163 168 184 192  MCV 91.3 92.8 90.8 90.8 91.3  MCH 29.4 29.3 29.9 30.0 29.8  MCHC 32.2 31.6 32.9 33.1 32.6  RDW 17.9* 17.9* 16.8* 16.8* 16.7*  LYMPHSABS 1.1 1.4  --  1.5 1.0  MONOABS 1.0 1.0  --  1.1* 0.9  EOSABS 0.0 0.1  --  0.2 0.2  BASOSABS 0.0 0.0  --  0.0 0.0    Recent Labs  Lab 11/21/23 0837 11/21/23 1036 11/21/23 1433 11/22/23 0717 11/23/23 0538 11/24/23 0554  NA 135  --   --  133* 134* 133*  K 4.4  --   --  4.7 5.4* 4.0  CL 94*  --   --  97* 95* 96*  CO2 28  --   --  23 25 25   ANIONGAP 13  --   --  13 14 12   GLUCOSE 121*  --   --  97 97 111*  BUN 42*  --   --  47* 53* 32*  CREATININE 10.90*  --   --  12.42* 13.69* 9.15*  AST 14*  --   --   --   --   --   ALT 9  --   --   --   --   --   ALKPHOS 63  --   --   --   --   --   BILITOT 1.1  --   --   --   --   --   ALBUMIN 3.8  --   --   --  2.5* 2.5*  CRP  --   --   --  11.9* 9.7* 10.3*  PROCALCITON  --   --   --  3.16 2.84 2.02  LATICACIDVEN 1.0 0.8  --   --   --   --   INR  --   --   --  1.6*  --   --   HGBA1C  --   --  5.3  --   --   --   MG  --   --   --   --  1.9 1.8  PHOS  --   --   --   --  7.0* 4.9*  CALCIUM 8.9  --   --  7.8* 8.0* 8.2*      Recent Labs  Lab 11/21/23 0837 11/21/23 1036 11/21/23 1433 11/22/23 0717 11/23/23 0538 11/24/23 0554  CRP  --   --   --  11.9* 9.7* 10.3*  PROCALCITON  --   --   --  3.16 2.84 2.02  LATICACIDVEN 1.0 0.8  --   --   --   --   INR  --   --   --  1.6*  --   --   HGBA1C  --   --  5.3  --   --   --   MG  --    --   --   --  1.9 1.8  CALCIUM 8.9  --   --  7.8* 8.0* 8.2*    --------------------------------------------------------------------------------------------------------------- Lab Results  Component Value Date   CHOL 232 (H) 06/10/2019   HDL 54 06/10/2019   LDLCALC 136 (H) 06/10/2019   TRIG 204 (H) 12/02/2019   CHOLHDL 4.3 06/10/2019    Lab Results  Component Value Date   HGBA1C 5.3 11/21/2023   No results for input(s): "TSH", "T4TOTAL", "FREET4", "T3FREE", "THYROIDAB" in the last 72 hours. No results for input(s): "VITAMINB12", "FOLATE", "FERRITIN", "TIBC", "IRON", "RETICCTPCT" in the last 72 hours. ------------------------------------------------------------------------------------------------------------------ Cardiac Enzymes No results for input(s): "CKMB", "TROPONINI", "MYOGLOBIN" in the last 168 hours.  Invalid input(s): "CK"  Micro Results Recent Results (from the past 240 hours)  Blood culture (routine x 2)     Status: None (Preliminary result)   Collection Time: 11/21/23  8:30 AM   Specimen: BLOOD  Result Value Ref Range Status   Specimen Description   Final    BLOOD RIGHT ANTECUBITAL Performed at [facility], [address] [place], [place] [address]    Special Requests   Final    Blood Culture adequate volume BOTTLES DRAWN AEROBIC AND ANAEROBIC Performed at [facility], [address] [place], [place] [address]    Culture   Final    NO GROWTH 3 DAYS Performed at [facility], [address] [place], [place] [address]    Report Status PENDING  Incomplete  Blood culture (routine x 2)     Status: None (Preliminary result)   Collection Time: 11/21/23  8:50 AM   Specimen: BLOOD RIGHT FOREARM  Result Value Ref Range Status   Specimen Description   Final    BLOOD RIGHT FOREARM Performed at [facility], [address] [place], [place] [address]    Special Requests   Final    BOTTLES DRAWN  AEROBIC AND ANAEROBIC Blood Culture adequate volume Performed at [facility], [address], [place], [place] [address]    Culture   Final    NO GROWTH 3 DAYS Performed at [facility], [address] [place], [place] [address]    Report Status PENDING  Incomplete  MRSA Next Gen by PCR, Nasal     Status: None   Collection Time: 11/21/23  1:55 PM   Specimen: Nasal Mucosa; Nasal Swab  Result Value Ref Range Status   MRSA by PCR Next Gen NOT DETECTED NOT DETECTED Final    Comment: (NOTE) The GeneXpert MRSA Assay (FDA approved for NASAL specimens only), is one component of a comprehensive MRSA colonization surveillance program. It is not intended to diagnose MRSA infection nor to guide or monitor treatment for MRSA infections. Test performance is not FDA approved in patients less than 68 years old. Performed at [facility], [address] [place], [place] [address]   Aerobic Culture w Gram Stain (superficial specimen)     Status: None (Preliminary result)   Collection Time: 11/21/23  3:30 PM   Specimen: Scrotum; Abscess  Result Value Ref Range Status   Specimen Description SCROTUM ABSCESS  Final   Special Requests NONE  Final   Gram Stain   Final    ABUNDANT WBC PRESENT, PREDOMINANTLY PMN FEW GRAM POSITIVE  COCCI RARE GRAM NEGATIVE RODS    Culture   Final    CULTURE REINCUBATED FOR BETTER GROWTH Performed at [facility], [address] [place], [place] [address]    Report Status PENDING  Incomplete    Radiology Report ECHOCARDIOGRAM COMPLETE Result Date: 11/22/2023    ECHOCARDIOGRAM REPORT   Patient Name:   INRI SOBIESKI Date of Exam: 11/22/2023 Medical Rec #:  [id number]     Height:       73.0 in Accession #:    [id number]    Weight:       235.9 lb Date of Birth:  [birth year]     BSA:          2.308 m Patient Age:    42 years      BP:           125/84 mmHg Patient Gender: M             HR:           74 bpm. Exam Location:  Inpatient  Procedure: 2D Echo, Cardiac Doppler and Color Doppler (Both Spectral and Color            Flow Doppler were utilized during procedure). Indications:    Murmur  History:        Patient has prior history of Echocardiogram examinations. Risk                 Factors:Hypertension and Diabetes.  Sonographer:    Lamont Snowball Referring Phys: [phone] EKTA V PATEL IMPRESSIONS  1. Left ventricular ejection fraction, by estimation, is 55 to 60%. The left ventricle has normal function. The left ventricle has no regional wall motion abnormalities. There is mild left ventricular hypertrophy. Left ventricular diastolic parameters were normal.  2. Right ventricular systolic function is normal. The right ventricular size is normal. Tricuspid regurgitation signal is inadequate for assessing PA pressure.  3. The mitral valve is degenerative. Trivial mitral valve regurgitation. No evidence of mitral stenosis.  4. The aortic valve is tricuspid. Aortic valve regurgitation is not visualized. No aortic stenosis is present.  5. The inferior vena cava is dilated in size with <50% respiratory variability, suggesting right atrial pressure of 15 mmHg. FINDINGS  Left Ventricle: Left ventricular ejection fraction, by estimation, is 55 to 60%. The left ventricle has normal function. The left ventricle has no regional wall motion abnormalities. The left ventricular internal cavity size was normal in size. There is  mild left ventricular hypertrophy. Left ventricular diastolic parameters were normal. Right Ventricle: The right ventricular size is normal. No increase in right ventricular wall thickness. Right ventricular systolic function is normal. Tricuspid regurgitation signal is inadequate for assessing PA pressure. Left Atrium: Left atrial size was normal in size. Right Atrium: Right atrial size was normal in size. Pericardium: There is no evidence of pericardial effusion. Mitral Valve: The mitral valve is degenerative in appearance. Mild mitral  annular calcification. Trivial mitral valve regurgitation. No evidence of mitral valve stenosis. MV peak gradient, 5.8 mmHg. The mean mitral valve gradient is 3.0 mmHg. Tricuspid Valve: The tricuspid valve is normal in structure. Tricuspid valve regurgitation is trivial. No evidence of tricuspid stenosis. Aortic Valve: The aortic valve is tricuspid. Aortic valve regurgitation is not visualized. No aortic stenosis is present. Aortic valve peak gradient measures 14.9 mmHg. Pulmonic Valve: The pulmonic valve was normal in structure. Pulmonic valve regurgitation is not visualized. No evidence of pulmonic stenosis. Aorta: The aortic root and ascending  aorta are structurally normal, with no evidence of dilitation. Venous: The inferior vena cava is dilated in size with less than 50% respiratory variability, suggesting right atrial pressure of 15 mmHg. IAS/Shunts: The interatrial septum was not well visualized.  LEFT VENTRICLE PLAX 2D LVIDd:         5.00 cm   Diastology LVIDs:         3.40 cm   LV e' medial:    7.18 cm/s LV PW:         1.20 cm   LV E/e' medial:  15.3 LV IVS:        1.20 cm   LV e' lateral:   8.59 cm/s LVOT diam:     2.00 cm   LV E/e' lateral: 12.8 LV SV:         84 LV SV Index:   36 LVOT Area:     3.14 cm  RIGHT VENTRICLE             IVC RV Basal diam:  4.70 cm     IVC diam: 2.60 cm RV S prime:     10.80 cm/s TAPSE (M-mode): 2.0 cm LEFT ATRIUM             Index        RIGHT ATRIUM           Index LA diam:        5.00 cm 2.17 cm/m   RA Area:     20.70 cm LA Vol (A2C):   78.3 ml 33.93 ml/m  RA Volume:   63.20 ml  27.38 ml/m LA Vol (A4C):   77.6 ml 33.62 ml/m LA Biplane Vol: 78.2 ml 33.88 ml/m  AORTIC VALVE AV Area (Vmax): 2.16 cm AV Vmax:        193.00 cm/s AV Peak Grad:   14.9 mmHg LVOT Vmax:      133.00 cm/s LVOT Vmean:     102.000 cm/s LVOT VTI:       0.268 m  AORTA Ao Root diam: 3.10 cm Ao Asc diam:  3.40 cm MITRAL VALVE MV Area (PHT): 3.02 cm     SHUNTS MV Area VTI:   2.73 cm     Systemic VTI:   0.27 m MV Peak grad:  5.8 mmHg     Systemic Diam: 2.00 cm MV Mean grad:  3.0 mmHg MV Vmax:       1.20 m/s MV Vmean:      78.2 cm/s MV Decel Time: 251 msec MV E velocity: 110.00 cm/s MV A velocity: 63.90 cm/s MV E/A ratio:  1.72 Sunit Tolia Electronically signed by Tessa Lerner Signature Date/Time: 11/22/2023/3:38:55 PM    Final      Signature  -   Susa Raring M.D on 11/24/2023 at 10:30 AM   -  To page go to [email or web address]

## 2023-11-25 DIAGNOSIS — N493 Fournier gangrene: Secondary | ICD-10-CM | POA: Diagnosis not present

## 2023-11-25 LAB — GLUCOSE, CAPILLARY
Glucose-Capillary: 106 mg/dL — ABNORMAL HIGH (ref 70–99)
Glucose-Capillary: 99 mg/dL (ref 70–99)
Glucose-Capillary: 75 mg/dL (ref 70–99)

## 2023-11-25 LAB — CBC WITH DIFFERENTIAL/PLATELET
Abs Immature Granulocytes: 0.07 10*3/uL (ref 0.00–0.07)
Basophils Absolute: 0 10*3/uL (ref 0.0–0.1)
Basophils Relative: 0 %
Eosinophils Absolute: 0.2 10*3/uL (ref 0.0–0.5)
Eosinophils Relative: 3 %
HCT: 22 % — ABNORMAL LOW (ref 39.0–52.0)
Hemoglobin: 7.1 g/dL — ABNORMAL LOW (ref 13.0–17.0)
Immature Granulocytes: 1 %
Lymphocytes Relative: 15 %
Lymphs Abs: 1.2 10*3/uL (ref 0.7–4.0)
MCH: 29.6 pg (ref 26.0–34.0)
MCHC: 32.3 g/dL (ref 30.0–36.0)
MCV: 91.7 fL (ref 80.0–100.0)
Monocytes Absolute: 0.8 10*3/uL (ref 0.1–1.0)
Monocytes Relative: 9 %
Neutro Abs: 5.9 10*3/uL (ref 1.7–7.7)
Neutrophils Relative %: 72 %
Platelets: 196 10*3/uL (ref 150–400)
RBC: 2.4 MIL/uL — ABNORMAL LOW (ref 4.22–5.81)
RDW: 17.1 % — ABNORMAL HIGH (ref 11.5–15.5)
WBC: 8.2 10*3/uL (ref 4.0–10.5)
nRBC: 0 % (ref 0.0–0.2)

## 2023-11-25 LAB — RENAL FUNCTION PANEL
Albumin: 2.4 g/dL — ABNORMAL LOW (ref 3.5–5.0)
Anion gap: 11 (ref 5–15)
BUN: 36 mg/dL — ABNORMAL HIGH (ref 6–20)
CO2: 26 mmol/L (ref 22–32)
Calcium: 7.9 mg/dL — ABNORMAL LOW (ref 8.9–10.3)
Chloride: 97 mmol/L — ABNORMAL LOW (ref 98–111)
Creatinine, Ser: 11.29 mg/dL — ABNORMAL HIGH (ref 0.61–1.24)
GFR, Estimated: 5 mL/min — ABNORMAL LOW (ref >60)
Glucose, Bld: 116 mg/dL — ABNORMAL HIGH (ref 70–99)
Phosphorus: 6.4 mg/dL — ABNORMAL HIGH (ref 2.5–4.6)
Potassium: 4.2 mmol/L (ref 3.5–5.1)
Sodium: 134 mmol/L — ABNORMAL LOW (ref 135–145)

## 2023-11-25 LAB — PREPARE RBC (CROSSMATCH)

## 2023-11-25 LAB — MAGNESIUM: Magnesium: 2 mg/dL (ref 1.7–2.4)

## 2023-11-25 LAB — C-REACTIVE PROTEIN: CRP: 10.5 mg/dL — ABNORMAL HIGH (ref <1.0)

## 2023-11-25 LAB — PROCALCITONIN: Procalcitonin: 1.76 ng/mL

## 2023-11-25 LAB — HIV ANTIBODY (ROUTINE TESTING W REFLEX): HIV Screen 4th Generation wRfx: NONREACTIVE

## 2023-11-25 LAB — HEMOGLOBIN AND HEMATOCRIT, BLOOD
HCT: 24.9 % — ABNORMAL LOW (ref 39.0–52.0)
Hemoglobin: 8.3 g/dL — ABNORMAL LOW (ref 13.0–17.0)

## 2023-11-25 MED ORDER — PIPERACILLIN-TAZOBACTAM IN DEX 2-0.25 GM/50ML IV SOLN
2.2500 g | Freq: Three times a day (TID) | INTRAVENOUS | Status: DC
  Administered 2023-11-25 – 2023-11-26 (×2): 2.25 g via INTRAVENOUS
  Filled 2023-11-25 (×3): qty 50

## 2023-11-25 MED ORDER — SODIUM CHLORIDE 0.9% IV SOLUTION: Freq: Once | INTRAVENOUS | Status: DC

## 2023-11-25 MED ORDER — HEPARIN SODIUM (PORCINE) 1000 UNIT/ML IJ SOLN
2500.0000 [IU] | Freq: Once | INTRAMUSCULAR | Status: AC
  Administered 2023-11-25: 2500 [IU] via INTRAVENOUS
  Filled 2023-11-25: qty 3

## 2023-11-25 MED ORDER — HEPARIN SODIUM (PORCINE) 1000 UNIT/ML IJ SOLN
1500.0000 [IU] | Freq: Once | INTRAMUSCULAR | Status: AC
  Administered 2023-11-25: 1500 [IU] via INTRAVENOUS
  Filled 2023-11-25: qty 2

## 2023-11-25 NOTE — Progress Notes (Signed)
 Pharmacy Antibiotic Note  Patrick Ibarra is a 43 y.o. male admitted on 11/21/2023 with  scrotal abscess,  possible necrotizing fascitis  .  Pharmacy consulted for Vancomycin / Zosyn dosing. S/p  incision and drainage on 11/21/23.  With ESRD - HD on TThSat HD  The patient received Vancomycin 2 gram load given 3/21, and  gave Vanc 1000mg  x1 3/24 due to HD done late PM on 3/23pm off schedule  and no vanc had been given yet post HD.   Next HD planned today 3/25 on TTS schedule.   3/21: Blood cx: NGTDx4days 3/21: Scrotal abscess: rare diptheroids(corynebacterium) 3/21: MRSA nasal: negative    Plan: Vancomycin 1000 mg IV after each HD- TTS Zosyn 2.25 grams iv Q 6 hours Follow up HD schedule, progress Monitor clinical progress, cultures, de-escalation, duration of therapy and vanc levels pre HD at per protocol.    Height: 6\' 1"  (185.4 cm) Weight: 107 kg (235 lb 14.3 oz) IBW/kg (Calculated) : 79.9  Temp (24hrs), Avg:98.1 F (36.7 C), Min:97.5 F (36.4 C), Max:98.7 F (37.1 C)  Recent Labs  Lab 11/21/23 0837 11/21/23 1036 11/22/23 0717 11/22/23 1954 11/23/23 0538 11/24/23 0554 11/25/23 0445  WBC 11.9*  --  9.6 10.9* 9.9 9.3 8.2  CREATININE 10.90*  --  12.42*  --  13.69* 9.15* 11.29*  LATICACIDVEN 1.0 0.8  --   --   --   --   --     Estimated Creatinine Clearance: 11.2 mL/min (A) (by C-G formula based on SCr of 11.29 mg/dL (H)).    No Known Allergies  Antimicrobials: Vancomycin 3/21> Zosyn 3/21 >  Clindamycin 3/21>3/25  Microbiology:  3/21: Bcx: NGTDx4d 3/21: Scrotal abscess: rare diptheroids(corynebacterium) 3/21: MRSA nasal: negative    Thank you for allowing pharmacy to be a part of this patient's care. Noah Delaine, RPh Clinical Pharmacist 11/25/2023 12:42 PM

## 2023-11-25 NOTE — TOC Progression Note (Addendum)
 Transition of Care [facility]) - Progression Note    Patient Details  Name: Patrick Ibarra MRN: [id number] Date of Birth: [birth year]  Transition of [facility]) CM/SW Contact  Gordy Clement, RN Phone Number: 11/25/2023, 12:33 PM  Clinical Narrative:    Update:12:53 PM  RNCM and Floor RN have spoken with Wife and she has agreed to come in today at 6:00 for teaching    Update 12:42 PM RNCM attempted to call patient's Wife to coordinate dressing change teaching here at bedside. No answer Floor RN w2ill have patient call Wife after HD to see if he can  get her to commit to a time to come in for the teaching   Patient will dc to home with Wife when medically stable. HH SN for scrotal wound care will be provided by [facility].  Wife is to come in for teaching of dressing changes and patient has stated to me that he will coordinate with his Wife and his floor Nurse for that to happen . The are no recommendations for DME. Wife will transport at  dc.   TOC will continue to follow patient for any additional discharge needs   AVS updated          Expected Discharge Plan and Services                                               Social Determinants of Health (SDOH) Interventions SDOH Screenings   Food Insecurity: No Food Insecurity (11/21/2023)  Housing: Low Risk  (11/21/2023)  Transportation Needs: No Transportation Needs (11/21/2023)  Utilities: Not At Risk (11/21/2023)  Alcohol Screen: Low Risk  (12/02/2019)  Depression (PHQ2-9): Low Risk  (12/02/2019)  Financial Resource Strain: High Risk (12/02/2019)  Physical Activity: Inactive (12/02/2019)  Social Connections: Moderately Integrated (12/02/2019)  Stress: Stress Concern Present (12/02/2019)  Tobacco Use: Low Risk  (11/21/2023)    Readmission Risk Interventions     No data to display

## 2023-11-25 NOTE — Progress Notes (Signed)
 Pt receives out-pt HD at [facility] on TTS 7:05 am chair time. Will assist as needed.   Olivia Canter Renal Navigator [phone]

## 2023-11-25 NOTE — Progress Notes (Signed)
 Received patient in bed to unit.  Alert and oriented.  Informed consent signed and in chart.   TX duration: 3.5 hours  Patient tolerated well.  Transported back to the room  Alert, without acute distress.  Hand-off given to patient's nurse.   Access used: Left AV Fistula, upper arm Access issues: none  Total UF removed: 1L Medication(s) given: 1 Unit of Blood, Vancomycin   11/25/23 1759  Vitals  Temp 98.4 F (36.9 C)  Temp Source Oral  BP (!) 155/89  MAP (mmHg) 108  BP Location Right Arm  BP Method Automatic  Patient Position (if appropriate) Lying  Pulse Rate 81  Pulse Rate Source Monitor  ECG Heart Rate 82  Resp 18  Oxygen Therapy  SpO2 99 %  O2 Device Room Air  During Treatment Monitoring  Duration of HD Treatment -hour(s) 3.5 hour(s)  HD Safety Checks Performed Yes  Intra-Hemodialysis Comments Tx completed  Dialysis Fluid Bolus Normal Saline  Bolus Amount (mL) 300 mL  Post Treatment  Dialyzer Clearance Lightly streaked  Liters Processed 84  Fluid Removed (mL) 1000 mL  Tolerated HD Treatment Yes  AVG/AVF Arterial Site Held (minutes) 7 minutes  AVG/AVF Venous Site Held (minutes) 7 minutes  Fistula / Graft Left Upper arm Arteriovenous fistula  Placement Date/Time: (c) 12/10/19 1830   Orientation: Left  Access Location: Upper arm  Access Type: (c) Arteriovenous fistula  Status Deaccessed     Stacie Glaze LPN [facility]

## 2023-11-25 NOTE — Care Management Important Message (Signed)
 Important Message  Patient Details  Name: Patrick Ibarra MRN: [id number] Date of Birth: 29-Jun-1981   Important Message Given:  Yes - Medicare IM     Dorena Bodo 11/25/2023, 1:51 PM

## 2023-11-25 NOTE — Progress Notes (Addendum)
 [facility] Progress Note   Subjective:    Seen and examined at bedside. No acute issues. Plan for HD today.  Objective Vitals:   11/24/23 2323 11/25/23 0337 11/25/23 0835 11/25/23 1142  BP: 118/75 133/84 127/87 133/86  Pulse: 73 76  71  Resp: 17 15  13   Temp: 98.7 F (37.1 C) 97.8 F (36.6 C)  97.8 F (36.6 C)  TempSrc: Oral Oral  Oral  SpO2: 100% 94%  99%  Weight:      Height:       Physical Exam General: Alert, nad Heart: RRR Lungs: Clear, normal wob Abdomen: soft non-tender GU: deferred  Extremities: No LE edema  Dialysis Access: LUE AVF +bruit   Filed Weights   11/21/23 0827  Weight: 107 kg   No intake or output data in the 24 hours ending 11/25/23 1241  Additional Objective Labs: Basic Metabolic Panel: Recent Labs  Lab 11/23/23 0538 11/24/23 0554 11/25/23 0445  NA 134* 133* 134*  K 5.4* 4.0 4.2  CL 95* 96* 97*  CO2 25 25 26   GLUCOSE 97 111* 116*  BUN 53* 32* 36*  CREATININE 13.69* 9.15* 11.29*  CALCIUM 8.0* 8.2* 7.9*  PHOS 7.0* 4.9* 6.4*   Liver Function Tests: Recent Labs  Lab 11/21/23 0837 11/23/23 0538 11/24/23 0554 11/25/23 0445  AST 14*  --   --   --   ALT 9  --   --   --   ALKPHOS 63  --   --   --   BILITOT 1.1  --   --   --   PROT 7.7  --   --   --   ALBUMIN 3.8 2.5* 2.5* 2.4*   No results for input(s): "LIPASE", "AMYLASE" in the last 168 hours. CBC: Recent Labs  Lab 11/22/23 0717 11/22/23 1954 11/23/23 0538 11/24/23 0554 11/25/23 0445  WBC 9.6 10.9* 9.9 9.3 8.2  NEUTROABS 7.1  --  6.9 7.1 5.9  HGB 6.5* 7.8* 8.2* 7.2* 7.1*  HCT 20.6* 23.7* 24.8* 22.1* 22.0*  MCV 92.8 90.8 90.8 91.3 91.7  PLT 163 168 184 192 196   Blood Culture    Component Value Date/Time   SDES SCROTUM ABSCESS 11/21/2023 1530   SPECREQUEST NONE 11/21/2023 1530   CULT  11/21/2023 1530    RARE DIPHTHEROIDS(CORYNEBACTERIUM SPECIES) Standardized susceptibility testing for this organism is not available. Performed at [facility], [address] [place], [place] [address]    REPTSTATUS 11/24/2023 FINAL 11/21/2023 1530    Cardiac Enzymes: No results for input(s): "CKTOTAL", "CKMB", "CKMBINDEX", "TROPONINI" in the last 168 hours. CBG: Recent Labs  Lab 11/24/23 1156 11/24/23 1558 11/24/23 2037 11/25/23 0834 11/25/23 1144  GLUCAP 100* 98 111* 106* 99   Iron Studies: No results for input(s): "IRON", "TIBC", "TRANSFERRIN", "FERRITIN" in the last 72 hours. Lab Results  Component Value Date   INR 1.6 (H) 11/22/2023   INR 1.3 (H) 12/02/2019   INR 1.1 10/01/2019   Studies/Results: No results found.  Medications:  piperacillin-tazobactam (ZOSYN)  IV 2.25 g (11/25/23 1213)   vancomycin      sodium chloride   Intravenous Once   carvedilol  25 mg Oral BID WC   Chlorhexidine Gluconate Cloth  6 each Topical Q0600   heparin  5,000 Units Subcutaneous Q8H    HYDROmorphone (DILAUDID) injection  0.5 mg Intravenous Once   insulin aspart  0-15 Units Subcutaneous TID WC   pravastatin  20 mg Oral q1800  sevelamer carbonate  3,200 mg Oral TID WC   sodium chloride flush  3-10 mL Intravenous Q12H    Dialysis Orders: [facility] TTS 4h   106.5kg   2K bath  B450   AVF   Heparin 7000 -Mircera 225 q 2 wks (last 3/20) -Hectorol 3  HD    Assessment/Plan: Scrotal abscess - urology following, s/p I&D at bedside. On IV Zosyn and Vancomycin. Clindamycin stopped 11/24/23.  ESRD - on HD TTS. Next HD 3/25.  HTN - BP controlled. Continue home meds  Volume - no vol excess. UF to dry wt  Anemia - ESRD + ABLA from abscess. Hgb dropping. S/p 2 units prbcs 3/22. Hgb now 7.1. 1 unit PRBCs ordered by Primary today. Blood can be given either on the HD unit or floor. HD RN to coordinate with bedside RN. On ESA outpatient, recently dosed.  Secondary hyperparathyroidism: Continue Hectorol. Renvela binder  DM - Per primary  Salome Holmes, NP [facility] 11/25/2023,12:41 PM  LOS: 4 days

## 2023-11-25 NOTE — Progress Notes (Signed)
 PROGRESS NOTE                                                                                                                                                                                                             Patient Demographics:    Patrick Ibarra, is a 43 y.o. male, DOB - February 28, 1981, WUJ:[id number]  Outpatient Primary MD for the patient is Marcine Matar, MD    LOS - 4  Admit date - 11/21/2023    Chief Complaint  Patient presents with   Testicle Pain       Brief Narrative (HPI from H&P)   43 y.o. male with past medical history  of end-stage renal disease on hemodialysis makes minimal urine, untreated diabetes mellitus type 2 coming [facility] for scrotal pain with suspected  Fournier's gangrene beginnings.  Per report patient's symptoms have been going on for the past 2 days and progressively worsening.  No reports of fevers chills nausea vomiting diarrhea bleeding urinary complaints.  Patient does have testicular pain with no relief of symptoms and worse with walking.  Patient report sore with foul smelling discharge.    Subjective:   Patient in bed, appears comfortable, denies any headache, no fever, no chest pain or pressure, no shortness of breath , no abdominal pain. No new focal weakness.   Assessment  & Plan :    Scrotal abscess.  Seen by urology, underwent incision and drainage on 11/21/2023, follow cultures, continue empiric IV antibiotics for now which include Vancomycin and Zosyn, stop clindamycin on 11/24/2023.  Will monitor cultures and adjust.  Appreciate urology input.  Ascites  RUQ USG is nonacute, stable echo.  Fluid overload likely due to underlying ESRD  Anemia of chronic disease due to underlying ESRD along with perioperative blood loss anemia.  He received 2 units of packed RBC on 11/22/2023, CBC stabilizing will give another unit of packed RBC transfusion on 11/25/2023, nephrology and  urology following.  Essential hypertension  Blood pressure is stable.  Home regimen includes amlodipine, Coreg 25 twice daily, hydralazine 25.   ESRD on dialysis [facility])  HD T/TH/sat and Nephrology consulted, Lokelma on 11/23/2023 for hyperkalemia.  Diabetes mellitus (HCC)  Monitor patient's Accu-Cheks  3 times daily before every meal.  Glycemic protocol.  CBG (last 3)  Recent Labs    11/24/23 1558 11/24/23 2037  11/25/23 0834  GLUCAP 98 111* 106*   Lab Results  Component Value Date   HGBA1C 5.3 11/21/2023         Condition -   Guarded  Family Communication  : Family member bedside on 11/22/2023  Code Status : Full code  Consults  : Urology, nephrology  PUD Prophylaxis :     Procedures  :     TTE 1. Left ventricular ejection fraction, by estimation, is 55 to 60%. The left ventricle has normal function. The left ventricle has no regional wall motion abnormalities. There is mild left ventricular hypertrophy. Left ventricular diastolic parameters were normal.  2. Right ventricular systolic function is normal. The right ventricular size is normal. Tricuspid regurgitation signal is inadequate for assessing PA pressure.  3. The mitral valve is degenerative. Trivial mitral valve regurgitation. No evidence of mitral stenosis.  4. The aortic valve is tricuspid. Aortic valve regurgitation is not visualized. No aortic stenosis is present.  5. The inferior vena cava is dilated in size with <50% respiratory variability, suggesting right atrial pressure of 15 mmHg  CT - 1. Positive for a 4.5 cm Scrotal Abscess. Posteroinferior scrotal wall affected. Underlying generalized perineum and body wall edema. But no soft tissue gas to strongly suggest a necrotizing infection at this time. Still, recommend Urology consultation. And note also a right scrotal hydrocele. 2. Moderate volume Ascites in the lower abdomen and pelvis, substantially progressed since a 2018 CT and with some complex fluid density.  Etiology is unclear. 3. Diffuse severe calcified atherosclerosis. Aortic Atherosclerosis (ICD10-I70.0).   Scrotal incision and drainage by urology on 11/21/2023      Disposition Plan  :    Status is: Inpatient   DVT Prophylaxis  :    heparin injection 5,000 Units Start: 11/24/23 0600 Place and maintain sequential compression device Start: 11/22/23 1128    Lab Results  Component Value Date   PLT 196 11/25/2023    Diet :  Diet Order             Diet heart healthy/carb modified Room service appropriate? Yes; Fluid consistency: Thin  Diet effective now                    Inpatient Medications  Scheduled Meds:  sodium chloride   Intravenous Once   carvedilol  25 mg Oral BID WC   Chlorhexidine Gluconate Cloth  6 each Topical Q0600   heparin  5,000 Units Subcutaneous Q8H    HYDROmorphone (DILAUDID) injection  0.5 mg Intravenous Once   insulin aspart  0-15 Units Subcutaneous TID WC   pravastatin  20 mg Oral q1800   sevelamer carbonate  3,200 mg Oral TID WC   sodium chloride flush  3-10 mL Intravenous Q12H   Continuous Infusions:  piperacillin-tazobactam (ZOSYN)  IV 2.25 g (11/25/23 0620)   vancomycin     PRN Meds:.acetaminophen **OR** acetaminophen, hydrALAZINE, morphine injection, nicotine, ondansetron **OR** ondansetron (ZOFRAN) IV, sodium chloride flush    Objective:   Vitals:   11/24/23 2036 11/24/23 2323 11/25/23 0337 11/25/23 0835  BP:  118/75 133/84 127/87  Pulse: 73 73 76   Resp: 14 17 15    Temp: 98.7 F (37.1 C) 98.7 F (37.1 C) 97.8 F (36.6 C)   TempSrc: Oral Oral Oral   SpO2: 100% 100% 94%   Weight:      Height:        Wt Readings from Last 3 Encounters:  11/21/23 107 kg  03/27/20 Marland Kitchen)  106.2 kg  02/16/20 95.3 kg    No intake or output data in the 24 hours ending 11/25/23 1026    Physical Exam  Awake Alert, No new F.N deficits, Normal affect [place].AT,PERRAL Supple Neck, No JVD,   Symmetrical Chest wall movement, Good air movement  bilaterally, CTAB RRR,No Gallops,Rubs or new Murmurs,  +ve B.Sounds, Abd Soft, No tenderness,   No edema, scrotum under bandage soaked with blood   Data Review:    Recent Labs  Lab 11/21/23 0837 11/22/23 0717 11/22/23 1954 11/23/23 0538 11/24/23 0554 11/25/23 0445  WBC 11.9* 9.6 10.9* 9.9 9.3 8.2  HGB 8.4* 6.5* 7.8* 8.2* 7.2* 7.1*  HCT 26.1* 20.6* 23.7* 24.8* 22.1* 22.0*  PLT 159 163 168 184 192 196  MCV 91.3 92.8 90.8 90.8 91.3 91.7  MCH 29.4 29.3 29.9 30.0 29.8 29.6  MCHC 32.2 31.6 32.9 33.1 32.6 32.3  RDW 17.9* 17.9* 16.8* 16.8* 16.7* 17.1*  LYMPHSABS 1.1 1.4  --  1.5 1.0 1.2  MONOABS 1.0 1.0  --  1.1* 0.9 0.8  EOSABS 0.0 0.1  --  0.2 0.2 0.2  BASOSABS 0.0 0.0  --  0.0 0.0 0.0    Recent Labs  Lab 11/21/23 0837 11/21/23 1036 11/21/23 1433 11/22/23 0717 11/23/23 0538 11/24/23 0554 11/25/23 0445  NA 135  --   --  133* 134* 133* 134*  K 4.4  --   --  4.7 5.4* 4.0 4.2  CL 94*  --   --  97* 95* 96* 97*  CO2 28  --   --  23 25 25 26   ANIONGAP 13  --   --  13 14 12 11   GLUCOSE 121*  --   --  97 97 111* 116*  BUN 42*  --   --  47* 53* 32* 36*  CREATININE 10.90*  --   --  12.42* 13.69* 9.15* 11.29*  AST 14*  --   --   --   --   --   --   ALT 9  --   --   --   --   --   --   ALKPHOS 63  --   --   --   --   --   --   BILITOT 1.1  --   --   --   --   --   --   ALBUMIN 3.8  --   --   --  2.5* 2.5* 2.4*  CRP  --   --   --  11.9* 9.7* 10.3* 10.5*  PROCALCITON  --   --   --  3.16 2.84 2.02 1.76  LATICACIDVEN 1.0 0.8  --   --   --   --   --   INR  --   --   --  1.6*  --   --   --   HGBA1C  --   --  5.3  --   --   --   --   MG  --   --   --   --  1.9 1.8 2.0  PHOS  --   --   --   --  7.0* 4.9* 6.4*  CALCIUM 8.9  --   --  7.8* 8.0* 8.2* 7.9*      Recent Labs  Lab 11/21/23 0837 11/21/23 1036 11/21/23 1433 11/22/23 0717 11/23/23 0538 11/24/23 0554 11/25/23 0445  CRP  --   --   --  11.9* 9.7* 10.3*  10.5*  PROCALCITON  --   --   --  3.16 2.84 2.02 1.76   LATICACIDVEN 1.0 0.8  --   --   --   --   --   INR  --   --   --  1.6*  --   --   --   HGBA1C  --   --  5.3  --   --   --   --   MG  --   --   --   --  1.9 1.8 2.0  CALCIUM 8.9  --   --  7.8* 8.0* 8.2* 7.9*    --------------------------------------------------------------------------------------------------------------- Lab Results  Component Value Date   CHOL 232 (H) 06/10/2019   HDL 54 06/10/2019   LDLCALC 136 (H) 06/10/2019   TRIG 204 (H) 12/02/2019   CHOLHDL 4.3 06/10/2019    Lab Results  Component Value Date   HGBA1C 5.3 11/21/2023   No results for input(s): "TSH", "T4TOTAL", "FREET4", "T3FREE", "THYROIDAB" in the last 72 hours. No results for input(s): "VITAMINB12", "FOLATE", "FERRITIN", "TIBC", "IRON", "RETICCTPCT" in the last 72 hours. ------------------------------------------------------------------------------------------------------------------ Cardiac Enzymes No results for input(s): "CKMB", "TROPONINI", "MYOGLOBIN" in the last 168 hours.  Invalid input(s): "CK"  Micro Results Recent Results (from the past 240 hours)  Blood culture (routine x 2)     Status: None (Preliminary result)   Collection Time: 11/21/23  8:30 AM   Specimen: BLOOD  Result Value Ref Range Status   Specimen Description   Final    BLOOD RIGHT ANTECUBITAL Performed at [facility], [address] [place], [place] [address]    Special Requests   Final    Blood Culture adequate volume BOTTLES DRAWN AEROBIC AND ANAEROBIC Performed at [facility], [address], [place], [place] [address]    Culture   Final    NO GROWTH 3 DAYS Performed at [facility], [address], [place], [place] [address]    Report Status PENDING  Incomplete  Blood culture (routine x 2)     Status: None (Preliminary result)   Collection Time: 11/21/23  8:50 AM   Specimen: BLOOD RIGHT FOREARM  Result Value Ref Range Status   Specimen Description   Final    BLOOD  RIGHT FOREARM Performed at [facility], [address] [place], [place] [address]    Special Requests   Final    BOTTLES DRAWN AEROBIC AND ANAEROBIC Blood Culture adequate volume Performed at [facility], [address] [place], [place] [address]    Culture   Final    NO GROWTH 3 DAYS Performed at [facility], [address] [place], [place] [address]    Report Status PENDING  Incomplete  MRSA Next Gen by PCR, Nasal     Status: None   Collection Time: 11/21/23  1:55 PM   Specimen: Nasal Mucosa; Nasal Swab  Result Value Ref Range Status   MRSA by PCR Next Gen NOT DETECTED NOT DETECTED Final    Comment: (NOTE) The GeneXpert MRSA Assay (FDA approved for NASAL specimens only), is one component of a comprehensive MRSA colonization surveillance program. It is not intended to diagnose MRSA infection nor to guide or monitor treatment for MRSA infections. Test performance is not FDA approved in patients less than 25 years old. Performed at [facility], [address], [place], [place] [address]   Aerobic Culture w Gram Stain (superficial specimen)     Status: None  Collection Time: 11/21/23  3:30 PM   Specimen: Scrotum; Abscess  Result Value Ref Range Status   Specimen Description SCROTUM ABSCESS  Final   Special Requests NONE  Final   Gram Stain   Final    ABUNDANT WBC PRESENT, PREDOMINANTLY PMN FEW GRAM POSITIVE COCCI RARE GRAM NEGATIVE RODS    Culture   Final    RARE DIPHTHEROIDS(CORYNEBACTERIUM SPECIES) Standardized susceptibility testing for this organism is not available. Performed at [facility], [address] [place], [place] [address]    Report Status 11/24/2023 FINAL  Final    Radiology Report No results found.    Signature  -   Susa Raring M.D on 11/25/2023 at 10:26 AM   -  To page go to [email or web address]

## 2023-11-26 DIAGNOSIS — N493 Fournier gangrene: Secondary | ICD-10-CM | POA: Diagnosis not present

## 2023-11-26 LAB — PROCALCITONIN: Procalcitonin: 1.21 ng/mL

## 2023-11-26 LAB — BPAM RBC
Blood Product Expiration Date: 202504202359
ISSUE DATE / TIME: 202503251415
Unit Type and Rh: 5100

## 2023-11-26 LAB — GLUCOSE, CAPILLARY
Glucose-Capillary: 94 mg/dL (ref 70–99)
Glucose-Capillary: 104 mg/dL — ABNORMAL HIGH (ref 70–99)
Glucose-Capillary: 99 mg/dL (ref 70–99)
Glucose-Capillary: 90 mg/dL (ref 70–99)
Glucose-Capillary: 78 mg/dL (ref 70–99)

## 2023-11-26 LAB — CULTURE, BLOOD (ROUTINE X 2)
Culture: NO GROWTH
Culture: NO GROWTH
Special Requests: ADEQUATE
Special Requests: ADEQUATE

## 2023-11-26 LAB — TYPE AND SCREEN
ABO/RH(D): O POS
Antibody Screen: NEGATIVE
Unit division: 0

## 2023-11-26 LAB — CBC WITH DIFFERENTIAL/PLATELET
Abs Immature Granulocytes: 0.08 10*3/uL — ABNORMAL HIGH (ref 0.00–0.07)
Basophils Absolute: 0 10*3/uL (ref 0.0–0.1)
Basophils Relative: 1 %
Eosinophils Absolute: 0.3 10*3/uL (ref 0.0–0.5)
Eosinophils Relative: 3 %
HCT: 23.9 % — ABNORMAL LOW (ref 39.0–52.0)
Hemoglobin: 7.8 g/dL — ABNORMAL LOW (ref 13.0–17.0)
Immature Granulocytes: 1 %
Lymphocytes Relative: 12 %
Lymphs Abs: 1 10*3/uL (ref 0.7–4.0)
MCH: 29.9 pg (ref 26.0–34.0)
MCHC: 32.6 g/dL (ref 30.0–36.0)
MCV: 91.6 fL (ref 80.0–100.0)
Monocytes Absolute: 0.7 10*3/uL (ref 0.1–1.0)
Monocytes Relative: 8 %
Neutro Abs: 6.5 10*3/uL (ref 1.7–7.7)
Neutrophils Relative %: 75 %
Platelets: 229 10*3/uL (ref 150–400)
RBC: 2.61 MIL/uL — ABNORMAL LOW (ref 4.22–5.81)
RDW: 17.2 % — ABNORMAL HIGH (ref 11.5–15.5)
WBC: 8.6 10*3/uL (ref 4.0–10.5)
nRBC: 0 % (ref 0.0–0.2)

## 2023-11-26 LAB — GC/CHLAMYDIA PROBE AMP (~~LOC~~) NOT AT ARMC
Chlamydia: NEGATIVE
Comment: NEGATIVE
Comment: NORMAL
Neisseria Gonorrhea: NEGATIVE

## 2023-11-26 LAB — MAGNESIUM: Magnesium: 1.9 mg/dL (ref 1.7–2.4)

## 2023-11-26 LAB — C-REACTIVE PROTEIN: CRP: 7.8 mg/dL — ABNORMAL HIGH (ref <1.0)

## 2023-11-26 MED ORDER — CHLORHEXIDINE GLUCONATE CLOTH 2 % EX PADS
6.0000 | MEDICATED_PAD | Freq: Every day | CUTANEOUS | Status: DC
Start: 2023-11-27 — End: 2023-11-27
  Administered 2023-11-27: 6 via TOPICAL

## 2023-11-26 MED ORDER — AMOXICILLIN-POT CLAVULANATE 500-125 MG PO TABS
1.0000 | ORAL_TABLET | ORAL | Status: DC
  Administered 2023-11-26 – 2023-11-27 (×2): 1 via ORAL
  Filled 2023-11-26 (×2): qty 1

## 2023-11-26 NOTE — Progress Notes (Addendum)
 [facility] Progress Note   Subjective:    Seen and examined patient at bedside. S/p 1 unit PRBCs yesterday for Hgb 7.1. Tolerated yesterday's HD with net UF 1L. Per patient, he may be going home tomorrow. Next HD 3/27 1st shift in preparation for possible discharge.  Objective Vitals:   11/25/23 1807 11/25/23 2000 11/25/23 2336 11/26/23 0455  BP:  (!) 151/95 134/85 (!) 136/93  Pulse:  87 80 78  Resp:  11 16 15   Temp:  98.5 F (36.9 C) 98.6 F (37 C) 98.2 F (36.8 C)  TempSrc:  Oral Oral Oral  SpO2:  100% 100% 98%  Weight: 104 kg     Height:       Physical Exam General: Alert, nad Heart: RRR Lungs: Clear, normal wob Abdomen: soft non-tender GU: deferred  Extremities: No LE edema  Dialysis Access: LUE AVF +bruit   Filed Weights   11/21/23 0827 11/25/23 1402 11/25/23 1807  Weight: 107 kg 105 kg 104 kg    Intake/Output Summary (Last 24 hours) at 11/26/2023 1200 Last data filed at 11/26/2023 0600 Gross per 24 hour  Intake 1086.66 ml  Output 1000 ml  Net 86.66 ml    Additional Objective Labs: Basic Metabolic Panel: Recent Labs  Lab 11/23/23 0538 11/24/23 0554 11/25/23 0445  NA 134* 133* 134*  K 5.4* 4.0 4.2  CL 95* 96* 97*  CO2 25 25 26   GLUCOSE 97 111* 116*  BUN 53* 32* 36*  CREATININE 13.69* 9.15* 11.29*  CALCIUM 8.0* 8.2* 7.9*  PHOS 7.0* 4.9* 6.4*   Liver Function Tests: Recent Labs  Lab 11/21/23 0837 11/23/23 0538 11/24/23 0554 11/25/23 0445  AST 14*  --   --   --   ALT 9  --   --   --   ALKPHOS 63  --   --   --   BILITOT 1.1  --   --   --   PROT 7.7  --   --   --   ALBUMIN 3.8 2.5* 2.5* 2.4*   No results for input(s): "LIPASE", "AMYLASE" in the last 168 hours. CBC: Recent Labs  Lab 11/22/23 1954 11/23/23 0538 11/24/23 0554 11/25/23 0445 11/25/23 2015 11/26/23 0449  WBC 10.9* 9.9 9.3 8.2  --  8.6  NEUTROABS  --  6.9 7.1 5.9  --  6.5  HGB 7.8* 8.2* 7.2* 7.1* 8.3* 7.8*  HCT 23.7* 24.8* 22.1* 22.0* 24.9* 23.9*  MCV  90.8 90.8 91.3 91.7  --  91.6  PLT 168 184 192 196  --  229   Blood Culture    Component Value Date/Time   SDES SCROTUM ABSCESS 11/21/2023 1530   SPECREQUEST NONE 11/21/2023 1530   CULT  11/21/2023 1530    RARE DIPHTHEROIDS(CORYNEBACTERIUM SPECIES) Standardized susceptibility testing for this organism is not available. Performed at [facility], [address] [place], [place] [address]    REPTSTATUS 11/24/2023 FINAL 11/21/2023 1530    Cardiac Enzymes: No results for input(s): "CKTOTAL", "CKMB", "CKMBINDEX", "TROPONINI" in the last 168 hours. CBG: Recent Labs  Lab 11/24/23 2037 11/25/23 0834 11/25/23 1144 11/25/23 1959 11/26/23 0907  GLUCAP 111* 106* 99 75 94   Iron Studies: No results for input(s): "IRON", "TIBC", "TRANSFERRIN", "FERRITIN" in the last 72 hours. Lab Results  Component Value Date   INR 1.6 (H) 11/22/2023   INR 1.3 (H) 12/02/2019   INR 1.1 10/01/2019   Studies/Results: No results found.  Medications:   sodium chloride  Intravenous Once   amoxicillin-clavulanate  1 tablet Oral Q24H   carvedilol  25 mg Oral BID WC   Chlorhexidine Gluconate Cloth  6 each Topical Q0600   heparin  5,000 Units Subcutaneous Q8H    HYDROmorphone (DILAUDID) injection  0.5 mg Intravenous Once   insulin aspart  0-15 Units Subcutaneous TID WC   pravastatin  20 mg Oral q1800   sevelamer carbonate  3,200 mg Oral TID WC   sodium chloride flush  3-10 mL Intravenous Q12H    Dialysis Orders: [facility] TTS 4h   106.5kg   2K bath  B450   AVF   Heparin 7000 -Mircera 225 q 2 wks (last 3/20) -Hectorol 3  HD   Assessment/Plan: Scrotal abscess - urology following, s/p I&D at bedside. Was on IV Zosyn, Vanc, and Clindamycin. Abscess cx from 3/21 showing corynebacterium. Now on PO Augmentin. ESRD - on HD TTS. Next HD 3/27.  HTN - BP controlled. Continue home meds  Volume - no vol excess. UF to dry wt  Anemia - ESRD + ABLA from abscess. S/p 3 unit PRBCs total given since admit  with last unit given yesterday. Hgb bumped up to 8.3 but now 7.8. Continue to transfuse as needed. ESA is not due yet. Last dose given in outpatient on 11/20/23. Secondary hyperparathyroidism: Continue Hectorol. Renvela binder  DM - Per primary Dispo - Patient reports he may go home tomorrow. Okay for discharge from a renal standpoint. HD scheduled 1st shift tomorrow in case he leaves.  Salome Holmes, NP [facility] 11/26/2023,12:00 PM  LOS: 5 days

## 2023-11-26 NOTE — Progress Notes (Signed)
 PROGRESS NOTE                                                                                                                                                                                                             Patient Demographics:    Patrick Ibarra, is a 43 y.o. male, DOB - 1981/06/23, YNW:[id number]  Outpatient Primary MD for the patient is Marcine Matar, MD    LOS - 5  Admit date - 11/21/2023    Chief Complaint  Patient presents with   Testicle Pain       Brief Narrative (HPI from H&P)   43 y.o. male with past medical history  of end-stage renal disease on hemodialysis makes minimal urine, untreated diabetes mellitus type 2 coming from [facility] for scrotal pain with suspected  Fournier's gangrene beginnings.  Per report patient's symptoms have been going on for the past 2 days and progressively worsening.  No reports of fevers chills nausea vomiting diarrhea bleeding urinary complaints.  Patient does have testicular pain with no relief of symptoms and worse with walking.  Patient report sore with foul smelling discharge.    Subjective:   Patient in bed, appears comfortable, denies any headache, no fever, no chest pain or pressure, no shortness of breath , no abdominal pain. No new focal weakness.    Assessment  & Plan :    Scrotal abscess.  Seen by urology, underwent incision and drainage on 11/21/2023, follow cultures, treated with empiric IV antibiotics which were vancomycin Zosyn and clindamycin, switched to Augmentin on 11/25/2024, scrotal wound cultures so far growing some Corynebacterium diphtheroid species.  Will monitor cultures and adjust.  Appreciate urology input.  Patient will get wound care teaching, him and his wife have been informed 3 days ago and reminded daily, also discussed with Dr. Liliane Shi who will follow the patient in a few days in his office.  Ascites  RUQ USG is nonacute, stable echo.   Fluid overload likely due to underlying ESRD  Anemia of chronic disease due to underlying ESRD along with perioperative blood loss anemia.  He received 2 units of packed RBC on 11/22/2023, CBC stabilizing will give another unit of packed RBC transfusion on 11/25/2023, nephrology and urology following.  Essential hypertension  Blood pressure is stable.  Home regimen includes amlodipine, Coreg 25 twice daily, hydralazine 25.  ESRD on dialysis [facility])  HD T/TH/sat and Nephrology consulted, Lokelma on 11/23/2023 for hyperkalemia.  Diabetes mellitus (HCC)  Monitor patient's Accu-Cheks  3 times daily before every meal.  Glycemic protocol.  CBG (last 3)  Recent Labs    11/25/23 1144 11/25/23 1959 11/26/23 0907  GLUCAP 99 75 94   Lab Results  Component Value Date   HGBA1C 5.3 11/21/2023         Condition -   Guarded  Family Communication  : Family member bedside on 11/22/2023  Code Status : Full code  Consults  : Urology, nephrology  PUD Prophylaxis :     Procedures  :     TTE 1. Left ventricular ejection fraction, by estimation, is 55 to 60%. The left ventricle has normal function. The left ventricle has no regional wall motion abnormalities. There is mild left ventricular hypertrophy. Left ventricular diastolic parameters were normal.  2. Right ventricular systolic function is normal. The right ventricular size is normal. Tricuspid regurgitation signal is inadequate for assessing PA pressure.  3. The mitral valve is degenerative. Trivial mitral valve regurgitation. No evidence of mitral stenosis.  4. The aortic valve is tricuspid. Aortic valve regurgitation is not visualized. No aortic stenosis is present.  5. The inferior vena cava is dilated in size with <50% respiratory variability, suggesting right atrial pressure of 15 mmHg  CT - 1. Positive for a 4.5 cm Scrotal Abscess. Posteroinferior scrotal wall affected. Underlying generalized perineum and body wall edema. But no soft tissue  gas to strongly suggest a necrotizing infection at this time. Still, recommend Urology consultation. And note also a right scrotal hydrocele. 2. Moderate volume Ascites in the lower abdomen and pelvis, substantially progressed since a 2018 CT and with some complex fluid density. Etiology is unclear. 3. Diffuse severe calcified atherosclerosis. Aortic Atherosclerosis (ICD10-I70.0).   Scrotal incision and drainage by urology on 11/21/2023      Disposition Plan  :    Status is: Inpatient   DVT Prophylaxis  :    heparin injection 5,000 Units Start: 11/24/23 0600 Place and maintain sequential compression device Start: 11/22/23 1128    Lab Results  Component Value Date   PLT 229 11/26/2023    Diet :  Diet Order             Diet heart healthy/carb modified Room service appropriate? Yes; Fluid consistency: Thin  Diet effective now                    Inpatient Medications  Scheduled Meds:  sodium chloride   Intravenous Once   carvedilol  25 mg Oral BID WC   Chlorhexidine Gluconate Cloth  6 each Topical Q0600   heparin  5,000 Units Subcutaneous Q8H    HYDROmorphone (DILAUDID) injection  0.5 mg Intravenous Once   insulin aspart  0-15 Units Subcutaneous TID WC   pravastatin  20 mg Oral q1800   sevelamer carbonate  3,200 mg Oral TID WC   sodium chloride flush  3-10 mL Intravenous Q12H   Continuous Infusions:  piperacillin-tazobactam (ZOSYN)  IV 2.25 g (11/26/23 0556)   vancomycin Stopped (11/25/23 1843)   PRN Meds:.acetaminophen **OR** acetaminophen, hydrALAZINE, morphine injection, nicotine, ondansetron **OR** ondansetron (ZOFRAN) IV, sodium chloride flush    Objective:   Vitals:   11/25/23 1807 11/25/23 2000 11/25/23 2336 11/26/23 0455  BP:  (!) 151/95 134/85 (!) 136/93  Pulse:  87 80 78  Resp:  11 16 15   Temp:  98.5 F (  36.9 C) 98.6 F (37 C) 98.2 F (36.8 C)  TempSrc:  Oral Oral Oral  SpO2:  100% 100% 98%  Weight: 104 kg     Height:        Wt Readings  from Last 3 Encounters:  11/25/23 104 kg  03/27/20 (!) 106.2 kg  02/16/20 95.3 kg     Intake/Output Summary (Last 24 hours) at 11/26/2023 0925 Last data filed at 11/26/2023 0600 Gross per 24 hour  Intake 1086.66 ml  Output 1000 ml  Net 86.66 ml      Physical Exam  Awake Alert, No new F.N deficits, Normal affect [place].AT,PERRAL Supple Neck, No JVD,   Symmetrical Chest wall movement, Good air movement bilaterally, CTAB RRR,No Gallops,Rubs or new Murmurs,  +ve B.Sounds, Abd Soft, No tenderness,   No edema, scrotum under bandage soaked with blood   Data Review:    Recent Labs  Lab 11/22/23 0717 11/22/23 1954 11/23/23 0538 11/24/23 0554 11/25/23 0445 11/25/23 2015 11/26/23 0449  WBC 9.6 10.9* 9.9 9.3 8.2  --  8.6  HGB 6.5* 7.8* 8.2* 7.2* 7.1* 8.3* 7.8*  HCT 20.6* 23.7* 24.8* 22.1* 22.0* 24.9* 23.9*  PLT 163 168 184 192 196  --  229  MCV 92.8 90.8 90.8 91.3 91.7  --  91.6  MCH 29.3 29.9 30.0 29.8 29.6  --  29.9  MCHC 31.6 32.9 33.1 32.6 32.3  --  32.6  RDW 17.9* 16.8* 16.8* 16.7* 17.1*  --  17.2*  LYMPHSABS 1.4  --  1.5 1.0 1.2  --  1.0  MONOABS 1.0  --  1.1* 0.9 0.8  --  0.7  EOSABS 0.1  --  0.2 0.2 0.2  --  0.3  BASOSABS 0.0  --  0.0 0.0 0.0  --  0.0    Recent Labs  Lab 11/21/23 0837 11/21/23 1036 11/21/23 1433 11/22/23 0717 11/23/23 0538 11/24/23 0554 11/25/23 0445 11/26/23 0449  NA 135  --   --  133* 134* 133* 134*  --   K 4.4  --   --  4.7 5.4* 4.0 4.2  --   CL 94*  --   --  97* 95* 96* 97*  --   CO2 28  --   --  23 25 25 26   --   ANIONGAP 13  --   --  13 14 12 11   --   GLUCOSE 121*  --   --  97 97 111* 116*  --   BUN 42*  --   --  47* 53* 32* 36*  --   CREATININE 10.90*  --   --  12.42* 13.69* 9.15* 11.29*  --   AST 14*  --   --   --   --   --   --   --   ALT 9  --   --   --   --   --   --   --   ALKPHOS 63  --   --   --   --   --   --   --   BILITOT 1.1  --   --   --   --   --   --   --   ALBUMIN 3.8  --   --   --  2.5* 2.5* 2.4*  --   CRP  --    --   --  11.9* 9.7* 10.3* 10.5* 7.8*  PROCALCITON  --   --   --  3.16 2.84 2.02 1.76 1.21  LATICACIDVEN 1.0 0.8  --   --   --   --   --   --   INR  --   --   --  1.6*  --   --   --   --   HGBA1C  --   --  5.3  --   --   --   --   --   MG  --   --   --   --  1.9 1.8 2.0 1.9  PHOS  --   --   --   --  7.0* 4.9* 6.4*  --   CALCIUM 8.9  --   --  7.8* 8.0* 8.2* 7.9*  --       Recent Labs  Lab 11/21/23 0837 11/21/23 1036 11/21/23 1433 11/22/23 0717 11/23/23 0538 11/24/23 0554 11/25/23 0445 11/26/23 0449  CRP  --   --   --  11.9* 9.7* 10.3* 10.5* 7.8*  PROCALCITON  --   --   --  3.16 2.84 2.02 1.76 1.21  LATICACIDVEN 1.0 0.8  --   --   --   --   --   --   INR  --   --   --  1.6*  --   --   --   --   HGBA1C  --   --  5.3  --   --   --   --   --   MG  --   --   --   --  1.9 1.8 2.0 1.9  CALCIUM 8.9  --   --  7.8* 8.0* 8.2* 7.9*  --     --------------------------------------------------------------------------------------------------------------- Lab Results  Component Value Date   CHOL 232 (H) 06/10/2019   HDL 54 06/10/2019   LDLCALC 136 (H) 06/10/2019   TRIG 204 (H) 12/02/2019   CHOLHDL 4.3 06/10/2019    Lab Results  Component Value Date   HGBA1C 5.3 11/21/2023   No results for input(s): "TSH", "T4TOTAL", "FREET4", "T3FREE", "THYROIDAB" in the last 72 hours. No results for input(s): "VITAMINB12", "FOLATE", "FERRITIN", "TIBC", "IRON", "RETICCTPCT" in the last 72 hours. ------------------------------------------------------------------------------------------------------------------ Cardiac Enzymes No results for input(s): "CKMB", "TROPONINI", "MYOGLOBIN" in the last 168 hours.  Invalid input(s): "CK"  Micro Results Recent Results (from the past 240 hours)  Blood culture (routine x 2)     Status: None   Collection Time: 11/21/23  8:30 AM   Specimen: BLOOD  Result Value Ref Range Status   Specimen Description   Final    BLOOD RIGHT ANTECUBITAL Performed at [facility], [address] [place], [place] [address]    Special Requests   Final    Blood Culture adequate volume BOTTLES DRAWN AEROBIC AND ANAEROBIC Performed at [facility], [address], [place], [place] [address]    Culture   Final    NO GROWTH 5 DAYS Performed at [facility], [address] [place], [place] [address]    Report Status 11/26/2023 FINAL  Final  Blood culture (routine x 2)     Status: None   Collection Time: 11/21/23  8:50 AM   Specimen: BLOOD RIGHT FOREARM  Result Value Ref Range Status   Specimen Description   Final    BLOOD RIGHT FOREARM Performed at [facility], [address] [place], [place] [address]    Special Requests   Final    BOTTLES DRAWN AEROBIC AND ANAEROBIC Blood  Culture adequate volume Performed at [facility], [address] [place], [place] [address]    Culture   Final    NO GROWTH 5 DAYS Performed at [facility], [address] [place], [place] [address]    Report Status 11/26/2023 FINAL  Final  MRSA Next Gen by PCR, Nasal     Status: None   Collection Time: 11/21/23  1:55 PM   Specimen: Nasal Mucosa; Nasal Swab  Result Value Ref Range Status   MRSA by PCR Next Gen NOT DETECTED NOT DETECTED Final    Comment: (NOTE) The GeneXpert MRSA Assay (FDA approved for NASAL specimens only), is one component of a comprehensive MRSA colonization surveillance program. It is not intended to diagnose MRSA infection nor to guide or monitor treatment for MRSA infections. Test performance is not FDA approved in patients less than 11 years old. Performed at [facility], [address], [place], [place] [address]   Aerobic Culture w Gram Stain (superficial specimen)     Status: None   Collection Time: 11/21/23  3:30 PM   Specimen: Scrotum; Abscess  Result Value Ref Range Status   Specimen Description SCROTUM ABSCESS  Final   Special Requests NONE   Final   Gram Stain   Final    ABUNDANT WBC PRESENT, PREDOMINANTLY PMN FEW GRAM POSITIVE COCCI RARE GRAM NEGATIVE RODS    Culture   Final    RARE DIPHTHEROIDS(CORYNEBACTERIUM SPECIES) Standardized susceptibility testing for this organism is not available. Performed at [facility], [address], [place], [place] [address]    Report Status 11/24/2023 FINAL  Final    Radiology Report No results found.    Signature  -   Susa Raring M.D on 11/26/2023 at 9:25 AM   -  To page go to [email or web address]

## 2023-11-26 NOTE — TOC Progression Note (Signed)
 Transition of Care [facility]) - Progression Note    Patient Details  Name: Patrick Ibarra MRN: [id number] Date of Birth: 1981/04/21  Transition of Care [facility]) CM/SW Contact  Gordy Clement, RN Phone Number: 11/26/2023, 10:25 AM  Clinical Narrative:     Patient has been recommended HH SN  [facility] has agreed to accept referral. AVS has been updated           Expected Discharge Plan and Services                                               Social Determinants of Health (SDOH) Interventions SDOH Screenings   Food Insecurity: No Food Insecurity (11/21/2023)  Housing: Low Risk  (11/21/2023)  Transportation Needs: No Transportation Needs (11/21/2023)  Utilities: Not At Risk (11/21/2023)  Alcohol Screen: Low Risk  (12/02/2019)  Depression (PHQ2-9): Low Risk  (12/02/2019)  Financial Resource Strain: High Risk (12/02/2019)  Physical Activity: Inactive (12/02/2019)  Social Connections: Moderately Integrated (12/02/2019)  Stress: Stress Concern Present (12/02/2019)  Tobacco Use: Low Risk  (11/21/2023)    Readmission Risk Interventions     No data to display

## 2023-11-27 ENCOUNTER — Other Ambulatory Visit (HOSPITAL_COMMUNITY): Payer: Self-pay

## 2023-11-27 DIAGNOSIS — N493 Fournier gangrene: Secondary | ICD-10-CM | POA: Diagnosis not present

## 2023-11-27 LAB — CBC WITH DIFFERENTIAL/PLATELET
Abs Immature Granulocytes: 0.08 10*3/uL — ABNORMAL HIGH (ref 0.00–0.07)
Basophils Absolute: 0 10*3/uL (ref 0.0–0.1)
Basophils Relative: 0 %
Eosinophils Absolute: 0.3 10*3/uL (ref 0.0–0.5)
Eosinophils Relative: 4 %
HCT: 25.7 % — ABNORMAL LOW (ref 39.0–52.0)
Hemoglobin: 8.4 g/dL — ABNORMAL LOW (ref 13.0–17.0)
Immature Granulocytes: 1 %
Lymphocytes Relative: 14 %
Lymphs Abs: 1.1 10*3/uL (ref 0.7–4.0)
MCH: 30.2 pg (ref 26.0–34.0)
MCHC: 32.7 g/dL (ref 30.0–36.0)
MCV: 92.4 fL (ref 80.0–100.0)
Monocytes Absolute: 0.8 10*3/uL (ref 0.1–1.0)
Monocytes Relative: 10 %
Neutro Abs: 5.5 10*3/uL (ref 1.7–7.7)
Neutrophils Relative %: 71 %
Platelets: 247 10*3/uL (ref 150–400)
RBC: 2.78 MIL/uL — ABNORMAL LOW (ref 4.22–5.81)
RDW: 17.1 % — ABNORMAL HIGH (ref 11.5–15.5)
WBC: 7.8 10*3/uL (ref 4.0–10.5)
nRBC: 0 % (ref 0.0–0.2)

## 2023-11-27 LAB — PROCALCITONIN: Procalcitonin: 1.07 ng/mL

## 2023-11-27 LAB — RENAL FUNCTION PANEL
Albumin: 2.3 g/dL — ABNORMAL LOW (ref 3.5–5.0)
Anion gap: 14 (ref 5–15)
BUN: 30 mg/dL — ABNORMAL HIGH (ref 6–20)
CO2: 25 mmol/L (ref 22–32)
Calcium: 8.1 mg/dL — ABNORMAL LOW (ref 8.9–10.3)
Chloride: 96 mmol/L — ABNORMAL LOW (ref 98–111)
Creatinine, Ser: 9.98 mg/dL — ABNORMAL HIGH (ref 0.61–1.24)
GFR, Estimated: 6 mL/min — ABNORMAL LOW (ref >60)
Glucose, Bld: 109 mg/dL — ABNORMAL HIGH (ref 70–99)
Phosphorus: 6 mg/dL — ABNORMAL HIGH (ref 2.5–4.6)
Potassium: 4 mmol/L (ref 3.5–5.1)
Sodium: 135 mmol/L (ref 135–145)

## 2023-11-27 LAB — GLUCOSE, CAPILLARY: Glucose-Capillary: 107 mg/dL — ABNORMAL HIGH (ref 70–99)

## 2023-11-27 MED ORDER — LIDOCAINE HCL (PF) 1 % IJ SOLN: 5.0000 mL | INTRAMUSCULAR | Status: DC | PRN

## 2023-11-27 MED ORDER — PENTAFLUOROPROP-TETRAFLUOROETH EX AERO: 1.0000 | INHALATION_SPRAY | CUTANEOUS | Status: DC | PRN

## 2023-11-27 MED ORDER — ALTEPLASE 2 MG IJ SOLR: 2.0000 mg | Freq: Once | INTRAMUSCULAR | Status: DC | PRN

## 2023-11-27 MED ORDER — LIDOCAINE-PRILOCAINE 2.5-2.5 % EX CREA: 1.0000 | TOPICAL_CREAM | CUTANEOUS | Status: DC | PRN

## 2023-11-27 MED ORDER — HEPARIN SODIUM (PORCINE) 1000 UNIT/ML DIALYSIS: 1000.0000 [IU] | INTRAMUSCULAR | Status: DC | PRN

## 2023-11-27 MED ORDER — ACETAMINOPHEN 500 MG PO TABS
500.0000 mg | ORAL_TABLET | Freq: Three times a day (TID) | ORAL | 0 refills | Status: AC | PRN
  Filled 2023-11-27: qty 20, 7d supply, fill #0

## 2023-11-27 MED ORDER — AMOXICILLIN-POT CLAVULANATE 500-125 MG PO TABS
1.0000 | ORAL_TABLET | ORAL | 0 refills | Status: AC
  Filled 2023-11-27: qty 10, 10d supply, fill #0

## 2023-11-27 NOTE — TOC Transition Note (Signed)
 Transition of Care [facility]) - Discharge Note   Patient Details  Name: Patrick Ibarra MRN: [id number] Date of Birth: 10-Jul-1981  Transition [facility] Care [facility]) CM/SW Contact:  Gordy Clement, RN Phone Number: 11/27/2023, 8:22 AM   Clinical Narrative:     Patient will DC to home today. [facility] will provide Skilled Nursing dressing changes. Family to transport   No additional TOC needs            Patient Goals and CMS Choice            Discharge Placement                       Discharge Plan and Services Additional resources added to the After Visit Summary for                                       Social Drivers of Health (SDOH) Interventions SDOH Screenings   Food Insecurity: No Food Insecurity (11/21/2023)  Housing: Low Risk  (11/21/2023)  Transportation Needs: No Transportation Needs (11/21/2023)  Utilities: Not At Risk (11/21/2023)  Alcohol Screen: Low Risk  (12/02/2019)  Depression (PHQ2-9): Low Risk  (12/02/2019)  Financial Resource Strain: High Risk (12/02/2019)  Physical Activity: Inactive (12/02/2019)  Social Connections: Moderately Integrated (12/02/2019)  Stress: Stress Concern Present (12/02/2019)  Tobacco Use: Low Risk  (11/21/2023)     Readmission Risk Interventions     No data to display

## 2023-11-27 NOTE — Procedures (Signed)
 I was present at this dialysis session. I have reviewed the session itself and made appropriate changes.   Vital signs in last 24 hours:  Temp:  [97.4 F (36.3 C)-98.5 F (36.9 C)] 98.2 F (36.8 C) (03/27 0912) Pulse Rate:  [72-81] 75 (03/27 0931) Resp:  [11-21] 13 (03/27 0931) BP: (138-159)/(87-99) 152/93 (03/27 0931) SpO2:  [97 %-100 %] 100 % (03/27 0931) Weight:  [107.3 kg] 107.3 kg (03/27 0912) Weight change:  Filed Weights   11/25/23 1402 11/25/23 1807 11/27/23 0912  Weight: 105 kg 104 kg 107.3 kg    Recent Labs  Lab 11/27/23 0442  NA 135  K 4.0  CL 96*  CO2 25  GLUCOSE 109*  BUN 30*  CREATININE 9.98*  CALCIUM 8.1*  PHOS 6.0*    Recent Labs  Lab 11/25/23 0445 11/25/23 2015 11/26/23 0449 11/27/23 0442  WBC 8.2  --  8.6 7.8  NEUTROABS 5.9  --  6.5 5.5  HGB 7.1* 8.3* 7.8* 8.4*  HCT 22.0* 24.9* 23.9* 25.7*  MCV 91.7  --  91.6 92.4  PLT 196  --  229 247    Scheduled Meds:  sodium chloride   Intravenous Once   amoxicillin-clavulanate  1 tablet Oral Q24H   carvedilol  25 mg Oral BID WC   Chlorhexidine Gluconate Cloth  6 each Topical Q0600   heparin  5,000 Units Subcutaneous Q8H    HYDROmorphone (DILAUDID) injection  0.5 mg Intravenous Once   insulin aspart  0-15 Units Subcutaneous TID WC   pravastatin  20 mg Oral q1800   sevelamer carbonate  3,200 mg Oral TID WC   sodium chloride flush  3-10 mL Intravenous Q12H   Continuous Infusions: PRN Meds:.acetaminophen **OR** acetaminophen, alteplase, heparin, hydrALAZINE, lidocaine (PF), lidocaine-prilocaine, morphine injection, nicotine, ondansetron **OR** ondansetron (ZOFRAN) IV, pentafluoroprop-tetrafluoroeth, sodium chloride flush   Irena Cords,  MD 11/27/2023, 9:35 AM

## 2023-11-27 NOTE — Discharge Instructions (Addendum)
 Follow with Primary MD Marcine Matar, MD in 7 days, follow-up with your urologist on April 2.  Get CBC, CMP, 2 view Chest X ray -  checked next visit with your primary MD   Activity: As tolerated with Full fall precautions use walker/cane & assistance as needed,  Leave scrotal wound packing inside, cover with ABD pad and mesh underwear daily.  Keep wound clean and dry at all times.  Disposition Home    Diet: Renal-Low carbohydrate diet, 1.5 L fluid restriction per day.  Check CBGs q. [facility].   Special Instructions: If you have smoked or chewed Tobacco  in the last 2 yrs please stop smoking, stop any regular Alcohol  and or any Recreational drug use.  On your next visit with your primary care physician please Get Medicines reviewed and adjusted.  Please request your Prim.MD to go over all Hospital Tests and Procedure/Radiological results at the follow up, please get all Hospital records sent to your Prim MD by signing hospital release before you go home.  If you experience worsening of your admission symptoms, develop shortness of breath, life threatening emergency, suicidal or homicidal thoughts you must seek medical attention immediately by calling 911 or calling your MD immediately  if symptoms less severe.  You Must read complete instructions/literature along with all the possible adverse reactions/side effects for all the Medicines you take and that have been prescribed to you. Take any new Medicines after you have completely understood and accpet all the possible adverse reactions/side effects.   Do not drive when taking Pain medications.  Do not take more than prescribed Pain, Sleep and Anxiety Medications  Wear Seat belts while driving.

## 2023-11-27 NOTE — Progress Notes (Signed)
 D/C order noted. Contacted [facility] to be advised of pt's d/c today and that pt should resume care on Saturday.   Olivia Canter Renal Navigator [phone]

## 2023-11-27 NOTE — Discharge Summary (Signed)
 Patrick Ibarra WUJ:[id number] DOB: [birth year] DOA: 11/21/2023  PCP: Marcine Matar, MD  Admit date: 11/21/2023  Discharge date: 11/27/2023  Admitted From: Home   Disposition:  Home   Recommendations for Outpatient Follow-up:   Follow up with PCP in 1-2 weeks  PCP Please obtain BMP/CBC, 2 view CXR in 1week,  (see Discharge instructions)   PCP Please follow up on the following pending results:    Home Health: RN   Equipment/Devices: None  Consultations: None  Discharge Condition: Stable    CODE STATUS: Full   Diet Recommendation: Renal-low carbohydrate diet, 1.5 L fluid restriction per day    Chief Complaint  Patient presents with   Testicle Pain     Brief history of present illness from the day of admission and additional interim summary      43 y.o. male with past medical history  of end-stage renal disease on hemodialysis makes minimal urine, untreated diabetes mellitus type 2 coming [facility] for scrotal pain with suspected  Fournier's gangrene beginnings.  Per report patient's symptoms have been going on for the past 2 days and progressively worsening.  No reports of fevers chills nausea vomiting diarrhea bleeding urinary complaints.  Patient does have testicular pain with no relief of symptoms and worse with walking.  Patient report sore with foul smelling discharge.                                                                  Hospital Course   Scrotal abscess.  Seen by urology, underwent incision and drainage on 11/21/2023, follow cultures, treated with empiric IV antibiotics which were vancomycin Zosyn and clindamycin, switched to Augmentin on 11/25/2024, scrotal wound cultures so far growing some Corynebacterium diphtheroid species.  Appreciate urology input.  Discussed with Dr. Liliane Shi, patient  will be discharged home, wound care as per instructed by Dr. Liliane Shi placed in the chart, patient will also get a home health nurse he has a follow-up appointment with urology on December 03, 2023, he will be getting 10 more days of oral Augmentin.  Currently stable no signs of sepsis or active infection, he is symptom-free eager to go home.   Ascites  RUQ USG is nonacute, stable echo.  Fluid overload likely due to underlying ESRD   Anemia of chronic disease due to underlying ESRD along with perioperative blood loss anemia.  He received 2 units of packed RBC on 11/22/2023, CBC stabilizing will give another unit of packed RBC transfusion on 11/25/2023, nephrology and urology following.  CBC now stable for the last 48 hours.   Essential hypertension  Blood pressure is stable.  Continue home regimen.   ESRD on dialysis [facility])  HD T/TH/sat and Nephrology consulted, Lokelma on 11/23/2023 for hyperkalemia.  Be dialyzed today prior to discharge.  Diabetes mellitus (HCC) more stress related hyperglycemia, A1c stable PCP to monitor intermittently.  Lab Results  Component Value Date   HGBA1C 5.3 11/21/2023      Discharge diagnosis     Principal Problem:   Fournier gangrene in male Active Problems:   Diabetes mellitus (HCC)   ESRD on dialysis [facility])   Essential hypertension   Anemia of chronic kidney failure   Ascites    Discharge instructions    Discharge Instructions     Discharge instructions   Complete by: As directed    Follow with Primary MD Marcine Matar, MD in 7 days, follow-up with your urologist on April 2.  Get CBC, CMP, 2 view Chest X ray -  checked next visit with your primary MD   Activity: As tolerated with Full fall precautions use walker/cane & assistance as needed,  Leave scrotal wound packing inside, cover with ABD pad and mesh underwear daily.  Keep wound clean and dry at all times.  Disposition Home    Diet: Renal-Low carbohydrate diet, 1.5 L fluid restriction per  day.  Check CBGs q. [facility] S.   Special Instructions: If you have smoked or chewed Tobacco  in the last 2 yrs please stop smoking, stop any regular Alcohol  and or any Recreational drug use.  On your next visit with your primary care physician please Get Medicines reviewed and adjusted.  Please request your Prim.MD to go over all Hospital Tests and Procedure/Radiological results at the follow up, please get all Hospital records sent to your Prim MD by signing hospital release before you go home.  If you experience worsening of your admission symptoms, develop shortness of breath, life threatening emergency, suicidal or homicidal thoughts you must seek medical attention immediately by calling 911 or calling your MD immediately  if symptoms less severe.  You Must read complete instructions/literature along with all the possible adverse reactions/side effects for all the Medicines you take and that have been prescribed to you. Take any new Medicines after you have completely understood and accpet all the possible adverse reactions/side effects.   Do not drive when taking Pain medications.  Do not take more than prescribed Pain, Sleep and Anxiety Medications  Wear Seat belts while driving.   Discharge wound care:   Complete by: As directed    Leave scrotal wound packing inside, cover with ABD pad and mesh underwear daily.  Keep wound clean and dry at all times.   Increase activity slowly   Complete by: As directed        Discharge Medications   Allergies as of 11/27/2023   No Known Allergies      Medication List     TAKE these medications    Accu-Chek FastClix Lancets Misc Use as instructed to check blood sugar once daily. E11.9   Accu-Chek Guide Me w/Device Kit 1 kit by Does not apply route daily. Use as instructed to check blood sugar once daily. E11.9   Accu-Chek Guide test strip Generic drug: glucose blood Use as instructed to check blood sugar once daily. E11.9    acetaminophen 500 MG tablet Commonly known as: TYLENOL Take 1 tablet (500 mg total) by mouth every 8 (eight) hours as needed.   amLODipine 10 MG tablet Commonly known as: NORVASC Take 1 tablet (10 mg total) by mouth at bedtime.   amoxicillin-clavulanate 500-125 MG tablet Commonly known as: AUGMENTIN Take 1 tablet by mouth daily.   carvedilol 25 MG tablet Commonly known as: COREG  Take 1 tablet (25 mg total) by mouth 2 (two) times daily for high blood pressure.   hydrALAZINE 25 MG tablet Commonly known as: APRESOLINE Take 1 tablet by mouth twice a day               Discharge Care Instructions  (From admission, onward)           Start     Ordered   11/27/23 0000  Discharge wound care:       Comments: Leave scrotal wound packing inside, cover with ABD pad and mesh underwear daily.  Keep wound clean and dry at all times.   11/27/23 0719             Follow-up Information     [facility] Follow up in 1 week(s).   Why: My office will call to schedule your follow-up appointment approximately 1 week Contact information: [address] [place] [address] [phone]        Marcine Matar, MD. Schedule an appointment as soon as possible for a visit in 3 day(s).   Specialty: Internal Medicine Contact information: [address] [address] [place] [address] [phone]         [facility] Follow up.   Why: [facility] will contact you within 48 hours to arrange a home health visit Contact information: [address] [place] [address] [phone]                 Major procedures and Radiology Reports - PLEASE review detailed and final reports thoroughly  -      ECHOCARDIOGRAM COMPLETE Result Date: 11/22/2023    ECHOCARDIOGRAM REPORT   Patient Name:   Patrick Ibarra Date of Exam: 11/22/2023 Medical Rec #:  [id number]     Height:       73.0 in Accession #:    [id number]    Weight:        235.9 lb Date of Birth:  03-31-1981     BSA:          2.308 m Patient Age:    42 years      BP:           125/84 mmHg Patient Gender: M             HR:           74 bpm. Exam Location:  Inpatient Procedure: 2D Echo, Cardiac Doppler and Color Doppler (Both Spectral and Color            Flow Doppler were utilized during procedure). Indications:    Murmur  History:        Patient has prior history of Echocardiogram examinations. Risk                 Factors:Hypertension and Diabetes.  Sonographer:    Lamont Snowball Referring Phys: [phone] EKTA V PATEL IMPRESSIONS  1. Left ventricular ejection fraction, by estimation, is 55 to 60%. The left ventricle has normal function. The left ventricle has no regional wall motion abnormalities. There is mild left ventricular hypertrophy. Left ventricular diastolic parameters were normal.  2. Right ventricular systolic function is normal. The right ventricular size is normal. Tricuspid regurgitation signal is inadequate for assessing PA pressure.  3. The mitral valve is degenerative. Trivial mitral valve regurgitation. No evidence of mitral stenosis.  4. The aortic valve is tricuspid. Aortic valve regurgitation is not visualized. No aortic stenosis is  present.  5. The inferior vena cava is dilated in size with <50% respiratory variability, suggesting right atrial pressure of 15 mmHg. FINDINGS  Left Ventricle: Left ventricular ejection fraction, by estimation, is 55 to 60%. The left ventricle has normal function. The left ventricle has no regional wall motion abnormalities. The left ventricular internal cavity size was normal in size. There is  mild left ventricular hypertrophy. Left ventricular diastolic parameters were normal. Right Ventricle: The right ventricular size is normal. No increase in right ventricular wall thickness. Right ventricular systolic function is normal. Tricuspid regurgitation signal is inadequate for assessing PA pressure. Left Atrium: Left atrial size was  normal in size. Right Atrium: Right atrial size was normal in size. Pericardium: There is no evidence of pericardial effusion. Mitral Valve: The mitral valve is degenerative in appearance. Mild mitral annular calcification. Trivial mitral valve regurgitation. No evidence of mitral valve stenosis. MV peak gradient, 5.8 mmHg. The mean mitral valve gradient is 3.0 mmHg. Tricuspid Valve: The tricuspid valve is normal in structure. Tricuspid valve regurgitation is trivial. No evidence of tricuspid stenosis. Aortic Valve: The aortic valve is tricuspid. Aortic valve regurgitation is not visualized. No aortic stenosis is present. Aortic valve peak gradient measures 14.9 mmHg. Pulmonic Valve: The pulmonic valve was normal in structure. Pulmonic valve regurgitation is not visualized. No evidence of pulmonic stenosis. Aorta: The aortic root and ascending aorta are structurally normal, with no evidence of dilitation. Venous: The inferior vena cava is dilated in size with less than 50% respiratory variability, suggesting right atrial pressure of 15 mmHg. IAS/Shunts: The interatrial septum was not well visualized.  LEFT VENTRICLE PLAX 2D LVIDd:         5.00 cm   Diastology LVIDs:         3.40 cm   LV e' medial:    7.18 cm/s LV PW:         1.20 cm   LV E/e' medial:  15.3 LV IVS:        1.20 cm   LV e' lateral:   8.59 cm/s LVOT diam:     2.00 cm   LV E/e' lateral: 12.8 LV SV:         84 LV SV Index:   36 LVOT Area:     3.14 cm  RIGHT VENTRICLE             IVC RV Basal diam:  4.70 cm     IVC diam: 2.60 cm RV S prime:     10.80 cm/s TAPSE (M-mode): 2.0 cm LEFT ATRIUM             Index        RIGHT ATRIUM           Index LA diam:        5.00 cm 2.17 cm/m   RA Area:     20.70 cm LA Vol (A2C):   78.3 ml 33.93 ml/m  RA Volume:   63.20 ml  27.38 ml/m LA Vol (A4C):   77.6 ml 33.62 ml/m LA Biplane Vol: 78.2 ml 33.88 ml/m  AORTIC VALVE AV Area (Vmax): 2.16 cm AV Vmax:        193.00 cm/s AV Peak Grad:   14.9 mmHg LVOT Vmax:       133.00 cm/s LVOT Vmean:     102.000 cm/s LVOT VTI:       0.268 m  AORTA Ao Root diam: 3.10 cm Ao Asc diam:  3.40 cm MITRAL VALVE MV  Area (PHT): 3.02 cm     SHUNTS MV Area VTI:   2.73 cm     Systemic VTI:  0.27 m MV Peak grad:  5.8 mmHg     Systemic Diam: 2.00 cm MV Mean grad:  3.0 mmHg MV Vmax:       1.20 m/s MV Vmean:      78.2 cm/s MV Decel Time: 251 msec MV E velocity: 110.00 cm/s MV A velocity: 63.90 cm/s MV E/A ratio:  1.72 Sunit Tolia Electronically signed by Tessa Lerner Signature Date/Time: 11/22/2023/3:38:55 PM    Final    US Abdomen Limited RUQ (LIVER/GB) Result Date: 11/22/2023 CLINICAL DATA:  Increased ascites EXAM: ULTRASOUND ABDOMEN LIMITED RIGHT UPPER QUADRANT COMPARISON:  CT pelvis dated 11/21/2023 FINDINGS: Gallbladder: No gallstones or wall thickening visualized. No sonographic Murphy sign noted by sonographer. Common bile duct: Diameter: 5 mm Liver: No focal lesion identified. Within normal limits in parenchymal echogenicity. Portal vein is patent on color Doppler imaging with normal direction of blood flow towards the liver. Other: Moderate volume ascites. Right kidney is diffusely echogenic and appears atrophic. IMPRESSION: 1. Normal sonographic appearance of the liver. 2. Moderate volume ascites. 3. Diffusely echogenic and atrophic right kidney, likely sequela of chronic medical renal disease. Electronically Signed   By: Agustin Cree M.D.   On: 11/22/2023 08:52   CT PELVIS W CONTRAST Result Date: 11/21/2023 CLINICAL DATA:  43 year old male with soft tissue infection suspected, query Fournier's gangrene. End stage renal disease, complicated diabetes, and foul smelling purulence from lower scrotum. EXAM: CT PELVIS WITH CONTRAST TECHNIQUE: Multidetector CT imaging of the pelvis was performed using the standard protocol following the bolus administration of intravenous contrast. RADIATION DOSE REDUCTION: This exam was performed according to the departmental dose-optimization program which  includes automated exposure control, adjustment of the mA and/or kV according to patient size and/or use of iterative reconstruction technique. CONTRAST:  OMNIPAQUE IOHEXOL 300 MG/ML  SOLN COMPARISON:  Noncontrast CT Abdomen and Pelvis 04/03/2017. FINDINGS: Urinary Tract: Decompressed urinary bladder. Kidneys not included. No evidence of hydroureter. Bowel: Nondilated visible large and small bowel loops. Moderate to large volume of ascites in the peritoneal cavity, with simple fluid density at the level of the abdominal gutters (series 4, image 10), but more complex fluid density in the pelvis (series 4, image 27. No layering hematocrit level. No pneumoperitoneum identified. Vascular/Lymphatic: Diffuse calcified atherosclerosis throughout the lower abdomen and pelvis. Visible major arterial structures appear to be patent and enhancing. Normal caliber distal aorta. No lymphadenopathy identified. Reproductive: There is a degree of generalized body wall edema including in the perineum (series 4, image 57) but there is no perineal or other soft tissue space gas identified. However, positive for irregular rim enhancing abscess of the scrotal wall along the posterior, caudal extent seen on series 4, image 65. This encompasses an area of about 4.5 cm and is well demonstrated on coronal image 42. Superimposed generalized scrotal wall edema. No scrotal wall gas. Superimposed right side hydrocele which is moderate and with fairly simple fluid density (series 4, image 67). Other:  Inferior liver tip is visible, not overtly nodular. Musculoskeletal: No acute or suspicious osseous lesion identified. IMPRESSION: 1. Positive for a 4.5 cm Scrotal Abscess. Posteroinferior scrotal wall affected. Underlying generalized perineum and body wall edema. But no soft tissue gas to strongly suggest a necrotizing infection at this time. Still, recommend Urology consultation. And note also a right scrotal hydrocele. 2. Moderate volume  Ascites in the lower abdomen and  pelvis, substantially progressed since a 2018 CT and with some complex fluid density. Etiology is unclear. 3. Diffuse severe calcified atherosclerosis. Aortic Atherosclerosis (ICD10-I70.0). Study discussed by telephone with Dr. Lynden Oxford on 11/21/2023 at 1001 hours. Electronically Signed   By: Odessa Fleming M.D.   On: 11/21/2023 10:15    Micro Results    Recent Results (from the past 240 hours)  Blood culture (routine x 2)     Status: None   Collection Time: 11/21/23  8:30 AM   Specimen: BLOOD  Result Value Ref Range Status   Specimen Description   Final    BLOOD RIGHT ANTECUBITAL Performed at [facility], [address] [place], [place] [address]    Special Requests   Final    Blood Culture adequate volume BOTTLES DRAWN AEROBIC AND ANAEROBIC Performed at [facility], [address] [place], [place] [address]    Culture   Final    NO GROWTH 5 DAYS Performed at [facility], [address], [place], [place] [address]    Report Status 11/26/2023 FINAL  Final  Blood culture (routine x 2)     Status: None   Collection Time: 11/21/23  8:50 AM   Specimen: BLOOD RIGHT FOREARM  Result Value Ref Range Status   Specimen Description   Final    BLOOD RIGHT FOREARM Performed at [facility], [address], [place], [place] [address]    Special Requests   Final    BOTTLES DRAWN AEROBIC AND ANAEROBIC Blood Culture adequate volume Performed at [facility], [address], [place], [place] [address]    Culture   Final    NO GROWTH 5 DAYS Performed at [facility], [address], [place], [place] [address]    Report Status 11/26/2023 FINAL  Final  MRSA Next Gen by PCR, Nasal     Status: None   Collection Time: 11/21/23  1:55 PM   Specimen: Nasal Mucosa; Nasal Swab  Result Value Ref Range Status   MRSA by PCR Next Gen NOT DETECTED NOT DETECTED Final     Comment: (NOTE) The GeneXpert MRSA Assay (FDA approved for NASAL specimens only), is one component of a comprehensive MRSA colonization surveillance program. It is not intended to diagnose MRSA infection nor to guide or monitor treatment for MRSA infections. Test performance is not FDA approved in patients less than 62 years old. Performed at [facility], [address] [place], [place] [address]   Aerobic Culture w Gram Stain (superficial specimen)     Status: None   Collection Time: 11/21/23  3:30 PM   Specimen: Scrotum; Abscess  Result Value Ref Range Status   Specimen Description SCROTUM ABSCESS  Final   Special Requests NONE  Final   Gram Stain   Final    ABUNDANT WBC PRESENT, PREDOMINANTLY PMN FEW GRAM POSITIVE COCCI RARE GRAM NEGATIVE RODS    Culture   Final    RARE DIPHTHEROIDS(CORYNEBACTERIUM SPECIES) Standardized susceptibility testing for this organism is not available. Performed at [facility], [address], [place], [place] [address]    Report Status 11/24/2023 FINAL  Final    Today   Subjective    Patrick Ibarra today has no headache,no chest abdominal pain,no new weakness tingling or numbness, feels much better wants to go home today.    Objective   Blood pressure 138/87, pulse 77, temperature 98.5 F (36.9 C), temperature source Oral, resp. rate 13, height  6\' 1"  (1.854 m), weight 104 kg, SpO2 98%.  No intake or output data in the 24 hours ending 11/27/23 0721  Exam  Awake Alert, No new F.N deficits,    [place].AT,PERRAL Supple Neck,   Symmetrical Chest wall movement, Good air movement bilaterally, CTAB RRR,No Gallops,   +ve B.Sounds, Abd Soft, Non tender,  No edema, scrotum under bandage      Data Review   Recent Labs  Lab 11/23/23 0538 11/24/23 0554 11/25/23 0445 11/25/23 2015 11/26/23 0449 11/27/23 0442  WBC 9.9 9.3 8.2  --  8.6 7.8  HGB 8.2* 7.2* 7.1* 8.3* 7.8* 8.4*  HCT 24.8* 22.1* 22.0* 24.9* 23.9* 25.7*  PLT 184 192 196   --  229 247  MCV 90.8 91.3 91.7  --  91.6 92.4  MCH 30.0 29.8 29.6  --  29.9 30.2  MCHC 33.1 32.6 32.3  --  32.6 32.7  RDW 16.8* 16.7* 17.1*  --  17.2* 17.1*  LYMPHSABS 1.5 1.0 1.2  --  1.0 1.1  MONOABS 1.1* 0.9 0.8  --  0.7 0.8  EOSABS 0.2 0.2 0.2  --  0.3 0.3  BASOSABS 0.0 0.0 0.0  --  0.0 0.0    Recent Labs  Lab 11/21/23 0837 11/21/23 0837 11/21/23 1036 11/21/23 1433 11/22/23 0717 11/23/23 0538 11/24/23 0554 11/25/23 0445 11/26/23 0449 11/27/23 0442  NA 135  --   --   --  133* 134* 133* 134*  --  135  K 4.4  --   --   --  4.7 5.4* 4.0 4.2  --  4.0  CL 94*  --   --   --  97* 95* 96* 97*  --  96*  CO2 28  --   --   --  23 25 25 26   --  25  ANIONGAP 13  --   --   --  13 14 12 11   --  14  GLUCOSE 121*  --   --   --  97 97 111* 116*  --  109*  BUN 42*  --   --   --  47* 53* 32* 36*  --  30*  CREATININE 10.90*  --   --   --  12.42* 13.69* 9.15* 11.29*  --  9.98*  AST 14*  --   --   --   --   --   --   --   --   --   ALT 9  --   --   --   --   --   --   --   --   --   ALKPHOS 63  --   --   --   --   --   --   --   --   --   BILITOT 1.1  --   --   --   --   --   --   --   --   --   ALBUMIN 3.8  --   --   --   --  2.5* 2.5* 2.4*  --  2.3*  CRP  --   --   --   --  11.9* 9.7* 10.3* 10.5* 7.8*  --   PROCALCITON  --    < >  --   --  3.16 2.84 2.02 1.76 1.21 1.07  LATICACIDVEN 1.0  --  0.8  --   --   --   --   --   --   --  INR  --   --   --   --  1.6*  --   --   --   --   --   HGBA1C  --   --   --  5.3  --   --   --   --   --   --   MG  --   --   --   --   --  1.9 1.8 2.0 1.9  --   PHOS  --   --   --   --   --  7.0* 4.9* 6.4*  --  6.0*  CALCIUM 8.9  --   --   --  7.8* 8.0* 8.2* 7.9*  --  8.1*   < > = values in this interval not displayed.    Total Time in preparing paper work, data evaluation and todays exam - 35 minutes  Signature  -    Susa Raring M.D on 11/27/2023 at 7:21 AM   -  To page go to [email or web address]

## 2023-11-27 NOTE — Discharge Planning (Signed)
 [facility] Patient Discharge Orders- [facility] CLINIC: [facility]  Patient's name: Patrick Ibarra Admit/DC Dates: 11/21/2023 - not left yet  Discharge Diagnoses: Scrotal abscess - S/p I&D 3/21, scrotal wound cx (+)  Corynebacterium diphtheroid species. Urology following Ascites - RUQ USG is nonacute, stable echo  ABLA from abscess - see below. Stable CBC for last 48 hrs  Aranesp: Given: No    Last Hgb: 8.4 PRBC's Given: Yes Date/# of units: S/p 3 units PRBCs given total (3/22 X 2 and 3/25) ESA dose for discharge: Continue mircera 225 mcg IV q 2 weeks  IV Iron dose at discharge: Continue weekly Venofer, follow protocol  Heparin change: Hold Heparin for 1 week then resume  EDW Change: No  Bath Change: No  Access intervention/Change: No  Hectorol change: No  Discharge Labs: Calcium 8.1 Phosphorus 6.0 Albumin 2.3 K+ 4.0  IV Antibiotics: Yes Details: Scrotal abscess. On PO Augmentin  On Coumadin?: No     D/C Meds to be reconciled by nurse after every discharge.  Completed By: Salome Holmes, NP   Reviewed by: MD:______ RN_______

## 2023-11-28 ENCOUNTER — Telehealth: Payer: Self-pay | Admitting: Nurse Practitioner

## 2023-11-28 ENCOUNTER — Telehealth: Payer: Self-pay

## 2023-11-28 NOTE — Transitions of Care (Post Inpatient/ED Visit) (Signed)
   11/28/2023  Name: [name] MRN: [id number] DOB: December 22, 1980  Today's TOC FU Call Status: Today's TOC FU Call Status:: Unsuccessful Call (1st Attempt) Unsuccessful Call (1st Attempt) Date: 11/28/23  Attempted to reach the patient regarding the most recent Inpatient/ED visit.  Follow Up Plan: Additional outreach attempts will be made to reach the patient to complete the Transitions of Care (Post Inpatient/ED visit) call.   Tressy Kunzman A. Mliss Fritz RN, BA, [employer] [facility] [employer], CRRN [facility] RN Care Manager, Transition of Care [phone]

## 2023-11-28 NOTE — Telephone Encounter (Signed)
 Transition of Care - Initial Contact from Inpatient Facility  Date of discharge: 11/27/2023 Date of contact: 11/28/2023 Method: Phone Spoke to: Patient  Patient contacted to discuss transition of care from recent inpatient hospitalization. Patient was admitted to [facility] from ... with discharge diagnosis of Scrotal abscess  The discharge medication list was reviewed. Patient understands the changes and has no concerns.   Patient will return to his/her outpatient HD unit on: 11/29/2023  No other concerns at this time.

## 2023-12-01 ENCOUNTER — Telehealth: Payer: Self-pay

## 2023-12-01 NOTE — Transitions of Care (Post Inpatient/ED Visit) (Signed)
 12/01/2023  Name: Patrick Ibarra MRN: [id number] DOB: 1981/08/05  Today's TOC FU Call Status: Today's TOC FU Call Status:: Successful TOC FU Call Completed TOC FU Call Complete Date: 12/01/23 Patient's Name and Date of Birth confirmed.  Transition Care Management Follow-up Telephone Call Date of Discharge: 11/27/23 Discharge Facility: [facility]) Type of Discharge: Inpatient Admission Primary Inpatient Discharge Diagnosis:: Scrotal abscess, Fournier Gangrene How have you been since you were released from the hospital?: Better Any questions or concerns?: No  Items Reviewed: Did you receive and understand the discharge instructions provided?: Yes Medications obtained,verified, and reconciled?: Yes (Medications Reviewed) Any new allergies since your discharge?: No Dietary orders reviewed?: NA Do you have support at home?: Yes People in Home: spouse Name of Support/Comfort Primary Source: wife  Medications Reviewed Today: Medications Reviewed Today     Reviewed by Marcos Eke, RN (Registered Nurse) on 12/01/23 at 1453  Med List Status: <None>   Medication Order Taking? Sig Documenting Provider Last Dose Status Informant  Accu-Chek FastClix Lancets MISC [id number] No Use as instructed to check blood sugar once daily. E11.9 Marcine Matar, MD Taking Active Self, Spouse/Significant Other, Pharmacy Records  acetaminophen (TYLENOL) 500 MG tablet [id number]  Take 1 tablet (500 mg total) by mouth every 8 (eight) hours as needed. Leroy Sea, MD  Active   amLODipine (NORVASC) 10 MG tablet [id number] No Take 1 tablet (10 mg total) by mouth at bedtime.  11/20/2023 Active Self, Spouse/Significant Other, Pharmacy Records  amoxicillin-clavulanate (AUGMENTIN) 500-125 MG tablet [id number]  Take 1 tablet by mouth daily. Leroy Sea, MD  Active   Blood Glucose Monitoring Suppl (ACCU-CHEK GUIDE ME) w/Device KIT [id number] No 1 kit by Does not apply route daily. Use as instructed to  check blood sugar once daily. E11.9 Marcine Matar, MD Taking Active Self, Spouse/Significant Other, Pharmacy Records  carvedilol (COREG) 25 MG tablet [id number] No Take 1 tablet (25 mg total) by mouth 2 (two) times daily for high blood pressure.  11/20/2023 Active Self, Spouse/Significant Other, Pharmacy Records  glucose blood (ACCU-CHEK GUIDE) test strip [id number] No Use as instructed to check blood sugar once daily. E11.9 Marcine Matar, MD Taking Active Self, Spouse/Significant Other, Pharmacy Records  hydrALAZINE (APRESOLINE) 25 MG tablet [id number] No Take 1 tablet by mouth twice a day  11/20/2023 Active Self, Spouse/Significant Other, Pharmacy Records            Home Care and Equipment/Supplies: Were Home Health Services Ordered?: Yes Name of Home Health Agency:: [facility] [phone] Has Agency set up a time to come to your home?: Yes First Home Health Visit Date: 11/29/23 [facility] Wound Care nurse following patient at home) Any new equipment or medical supplies ordered?: No  Functional Questionnaire: Do you need assistance with bathing/showering or dressing?: No Do you need assistance with meal preparation?: No Do you need assistance with eating?: No Do you have difficulty maintaining continence: No Do you need assistance with getting out of bed/getting out of a chair/moving?: No Do you have difficulty managing or taking your medications?: No  Follow up appointments reviewed: PCP Follow-up appointment confirmed?: No (Patient stated he will call Dr. Henriette Combs office himself for follow up) MD Provider Line Number:[phone] Given: No Specialist Hospital Follow-up appointment confirmed?: Yes Follow-Up Specialty Provider:: [facility] called & scheduled follow up appt with patient (scheduled for week of 3/31) Do you need transportation to your follow-up appointment?: No Do you understand care options if your  condition(s)  worsen?: Yes-patient verbalized understanding   Taegen Delker A. Mliss Fritz RN, BA, [facility], CRRN  [facility] RN Care Manager, Transition of Care [phone]

## 2023-12-18 ENCOUNTER — Telehealth: Payer: Self-pay

## 2023-12-18 NOTE — Telephone Encounter (Signed)
 Dr. Lincoln Renshaw received home health orders for the patient;  I called [facility]: [phone] and spoke to [facility] informing her that Dr Lincoln Renshaw will not be signing these orders as she has not seen the patient since 05/01/2020.  Gordan Latina stated she understood.

## 2024-02-12 ENCOUNTER — Other Ambulatory Visit: Payer: Self-pay

## 2024-02-16 ENCOUNTER — Other Ambulatory Visit: Payer: Self-pay

## 2024-03-03 ENCOUNTER — Ambulatory Visit: Admitting: Nurse Practitioner

## 2024-09-14 ENCOUNTER — Other Ambulatory Visit: Payer: Self-pay

## 2024-09-20 ENCOUNTER — Other Ambulatory Visit: Payer: Self-pay

## 2024-09-20 MED ORDER — CARVEDILOL 25 MG PO TABS
25.0000 mg | ORAL_TABLET | Freq: Two times a day (BID) | ORAL | 3 refills | Status: AC
  Filled 2024-09-20: qty 180, 90d supply, fill #0

## 2024-09-20 MED ORDER — AMLODIPINE BESYLATE 10 MG PO TABS
10.0000 mg | ORAL_TABLET | Freq: Every day | ORAL | 3 refills | Status: AC
  Filled 2024-09-20: qty 90, 90d supply, fill #0

## 2024-09-20 MED ORDER — HYDRALAZINE HCL 25 MG PO TABS
25.0000 mg | ORAL_TABLET | Freq: Two times a day (BID) | ORAL | 3 refills | Status: AC
  Filled 2024-09-20: qty 180, 90d supply, fill #0

## 2024-09-22 ENCOUNTER — Other Ambulatory Visit: Payer: Self-pay
